# Patient Record
Sex: Male | Born: 1965
Health system: Southern US, Community
[De-identification: ages and names within clinical notes are randomized; demographics above are authoritative.]

## PROBLEM LIST (undated history)

## (undated) DIAGNOSIS — E119 Type 2 diabetes mellitus without complications: Secondary | ICD-10-CM

## (undated) DIAGNOSIS — G4730 Sleep apnea, unspecified: Secondary | ICD-10-CM

## (undated) DIAGNOSIS — I10 Essential (primary) hypertension: Secondary | ICD-10-CM

## (undated) DIAGNOSIS — K8020 Calculus of gallbladder without cholecystitis without obstruction: Secondary | ICD-10-CM

## (undated) DIAGNOSIS — E785 Hyperlipidemia, unspecified: Secondary | ICD-10-CM

## (undated) DIAGNOSIS — D649 Anemia, unspecified: Secondary | ICD-10-CM

## (undated) DIAGNOSIS — R519 Headache, unspecified: Secondary | ICD-10-CM

## (undated) DIAGNOSIS — F329 Major depressive disorder, single episode, unspecified: Secondary | ICD-10-CM

## (undated) DIAGNOSIS — L0231 Cutaneous abscess of buttock: Secondary | ICD-10-CM

## (undated) DIAGNOSIS — I82409 Acute embolism and thrombosis of unspecified deep veins of unspecified lower extremity: Secondary | ICD-10-CM

## (undated) DIAGNOSIS — F32A Depression, unspecified: Secondary | ICD-10-CM

## (undated) DIAGNOSIS — K7460 Unspecified cirrhosis of liver: Secondary | ICD-10-CM

## (undated) DIAGNOSIS — F419 Anxiety disorder, unspecified: Secondary | ICD-10-CM

## (undated) DIAGNOSIS — R161 Splenomegaly, not elsewhere classified: Secondary | ICD-10-CM

## (undated) HISTORY — DX: Splenomegaly, not elsewhere classified: R16.1

## (undated) HISTORY — PX: NASAL SINUS SURGERY: SHX719

## (undated) HISTORY — DX: Anemia, unspecified: D64.9

## (undated) HISTORY — DX: Essential (primary) hypertension: I10

## (undated) HISTORY — DX: Calculus of gallbladder without cholecystitis without obstruction: K80.20

## (undated) HISTORY — DX: Acute embolism and thrombosis of unspecified deep veins of unspecified lower extremity: I82.409

## (undated) HISTORY — DX: Cutaneous abscess of buttock: L02.31

## (undated) HISTORY — DX: Anxiety disorder, unspecified: F41.9

## (undated) HISTORY — DX: Hyperlipidemia, unspecified: E78.5

## (undated) HISTORY — PX: LUMBAR DISC SURGERY: SHX700

## (undated) HISTORY — DX: Depression, unspecified: F32.A

## (undated) HISTORY — PX: KNEE ARTHROSCOPY: SUR90

## (undated) HISTORY — DX: Unspecified cirrhosis of liver: K74.60

## (undated) HISTORY — PX: LAPAROSCOPIC CHOLECYSTECTOMY: SUR755

---
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
## 2019-05-11 ENCOUNTER — Emergency Department (HOSPITAL_COMMUNITY)
Admission: EM | Admit: 2019-05-11 | Discharge: 2019-05-12 | Disposition: A | Discharge status: Home / Self Care | Payer: Self-pay | Attending: Emergency Medicine | Admitting: Emergency Medicine

## 2019-05-11 ENCOUNTER — Other Ambulatory Visit: Payer: Self-pay

## 2019-05-11 DIAGNOSIS — Y929 Unspecified place or not applicable: Secondary | ICD-10-CM | POA: Insufficient documentation

## 2019-05-11 DIAGNOSIS — Y999 Unspecified external cause status: Secondary | ICD-10-CM | POA: Insufficient documentation

## 2019-05-11 DIAGNOSIS — W19XXXA Unspecified fall, initial encounter: Secondary | ICD-10-CM | POA: Insufficient documentation

## 2019-05-11 DIAGNOSIS — Y939 Activity, unspecified: Secondary | ICD-10-CM | POA: Insufficient documentation

## 2019-05-11 DIAGNOSIS — M25562 Pain in left knee: Secondary | ICD-10-CM | POA: Insufficient documentation

## 2019-05-11 DIAGNOSIS — M25552 Pain in left hip: Secondary | ICD-10-CM | POA: Insufficient documentation

## 2019-05-11 DIAGNOSIS — E119 Type 2 diabetes mellitus without complications: Secondary | ICD-10-CM | POA: Insufficient documentation

## 2019-05-11 HISTORY — DX: Type 2 diabetes mellitus without complications: E11.9

## 2019-05-11 NOTE — ED Triage Notes (Signed)
Per EMS - Pt coming from home. Pt had injury 3 days ago, while getting out of bath tub he fell and twisted his hip causing injury that has not been evaluated. Pain in left hip x 3 days now. Today pt has been taking tezapam 40m,  multiple times throughout the day, RX is not prescribed to him, but to his wife. Last pill taken approx 2100. Approx 2300 pt got up out of bed, felt dizzy and fell down flat prone on face. Pt still complaining of left hip pain and now dizziness secondary to taking meds.   EKG NSR  150/90 CBG 355 80 HR 100% RA  16 R  100 mcg fentanyl   20 Lt AC

## 2019-05-12 ENCOUNTER — Encounter (HOSPITAL_COMMUNITY): Payer: Self-pay | Admitting: Emergency Medicine

## 2019-05-12 ENCOUNTER — Emergency Department (HOSPITAL_COMMUNITY): Payer: Self-pay

## 2019-05-12 ENCOUNTER — Other Ambulatory Visit: Payer: Self-pay

## 2019-05-12 ENCOUNTER — Inpatient Hospital Stay (HOSPITAL_COMMUNITY)
Admission: EM | Admit: 2019-05-12 | Discharge: 2019-05-17 | DRG: 872 | Disposition: A | Discharge status: Home Health | Payer: Self-pay | Attending: Family Medicine | Admitting: Family Medicine

## 2019-05-12 DIAGNOSIS — A4151 Sepsis due to Escherichia coli [E. coli]: Principal | ICD-10-CM | POA: Diagnosis present

## 2019-05-12 DIAGNOSIS — S76312A Strain of muscle, fascia and tendon of the posterior muscle group at thigh level, left thigh, initial encounter: Secondary | ICD-10-CM | POA: Diagnosis present

## 2019-05-12 DIAGNOSIS — M25552 Pain in left hip: Secondary | ICD-10-CM

## 2019-05-12 DIAGNOSIS — R739 Hyperglycemia, unspecified: Secondary | ICD-10-CM

## 2019-05-12 DIAGNOSIS — Z6841 Body Mass Index (BMI) 40.0 and over, adult: Secondary | ICD-10-CM

## 2019-05-12 DIAGNOSIS — W182XXA Fall in (into) shower or empty bathtub, initial encounter: Secondary | ICD-10-CM | POA: Diagnosis present

## 2019-05-12 DIAGNOSIS — E119 Type 2 diabetes mellitus without complications: Secondary | ICD-10-CM

## 2019-05-12 DIAGNOSIS — Y93E1 Activity, personal bathing and showering: Secondary | ICD-10-CM

## 2019-05-12 DIAGNOSIS — A419 Sepsis, unspecified organism: Secondary | ICD-10-CM | POA: Diagnosis present

## 2019-05-12 DIAGNOSIS — N3000 Acute cystitis without hematuria: Secondary | ICD-10-CM | POA: Diagnosis present

## 2019-05-12 DIAGNOSIS — N390 Urinary tract infection, site not specified: Secondary | ICD-10-CM

## 2019-05-12 DIAGNOSIS — Z20828 Contact with and (suspected) exposure to other viral communicable diseases: Secondary | ICD-10-CM | POA: Diagnosis present

## 2019-05-12 DIAGNOSIS — Z9114 Patient's other noncompliance with medication regimen: Secondary | ICD-10-CM

## 2019-05-12 DIAGNOSIS — E1165 Type 2 diabetes mellitus with hyperglycemia: Secondary | ICD-10-CM | POA: Diagnosis present

## 2019-05-12 HISTORY — DX: Headache, unspecified: R51.9

## 2019-05-12 HISTORY — DX: Sleep apnea, unspecified: G47.30

## 2019-05-12 LAB — BASIC METABOLIC PANEL
Anion gap: 11 (ref 5–15)
BUN: 14 mg/dL (ref 6–20)
CO2: 20 mmol/L — ABNORMAL LOW (ref 22–32)
Calcium: 8.8 mg/dL — ABNORMAL LOW (ref 8.9–10.3)
Chloride: 97 mmol/L — ABNORMAL LOW (ref 98–111)
Creatinine, Ser: 0.72 mg/dL (ref 0.61–1.24)
GFR calc Af Amer: >60 mL/min (ref >60)
GFR calc non Af Amer: >60 mL/min (ref >60)
Glucose, Bld: 386 mg/dL — ABNORMAL HIGH (ref 70–99)
Potassium: 4.6 mmol/L (ref 3.5–5.1)
Sodium: 128 mmol/L — ABNORMAL LOW (ref 135–145)

## 2019-05-12 LAB — CBC
HCT: 46.9 % (ref 39.0–52.0)
Hemoglobin: 16.4 g/dL (ref 13.0–17.0)
MCH: 30.5 pg (ref 26.0–34.0)
MCHC: 35 g/dL (ref 30.0–36.0)
MCV: 87.2 fL (ref 80.0–100.0)
Platelets: 193 10*3/uL (ref 150–400)
RBC: 5.38 MIL/uL (ref 4.22–5.81)
RDW: 13.7 % (ref 11.5–15.5)
WBC: 17.3 10*3/uL — ABNORMAL HIGH (ref 4.0–10.5)
nRBC: 0 % (ref 0.0–0.2)

## 2019-05-12 LAB — HEPATIC FUNCTION PANEL
ALT: 47 U/L — ABNORMAL HIGH (ref 0–44)
AST: 52 U/L — ABNORMAL HIGH (ref 15–41)
Albumin: 2.5 g/dL — ABNORMAL LOW (ref 3.5–5.0)
Alkaline Phosphatase: 164 U/L — ABNORMAL HIGH (ref 38–126)
Bilirubin, Direct: 0.7 mg/dL — ABNORMAL HIGH (ref 0.0–0.2)
Indirect Bilirubin: 1.4 mg/dL — ABNORMAL HIGH (ref 0.3–0.9)
Total Bilirubin: 2.1 mg/dL — ABNORMAL HIGH (ref 0.3–1.2)
Total Protein: 7.2 g/dL (ref 6.5–8.1)

## 2019-05-12 LAB — URINALYSIS, ROUTINE W REFLEX MICROSCOPIC
Bilirubin Urine: NEGATIVE
Glucose, UA: >=500 mg/dL — AB
Ketones, ur: 20 mg/dL — AB
Nitrite: NEGATIVE
Protein, ur: 100 mg/dL — AB
RBC / HPF: >50 RBC/hpf — ABNORMAL HIGH (ref 0–5)
Specific Gravity, Urine: 1.026 (ref 1.005–1.030)
WBC, UA: >50 WBC/hpf — ABNORMAL HIGH (ref 0–5)
pH: 5 (ref 5.0–8.0)

## 2019-05-12 LAB — CBG MONITORING, ED: Glucose-Capillary: 365 mg/dL — ABNORMAL HIGH (ref 70–99)

## 2019-05-12 IMAGING — CR DG KNEE COMPLETE 4+V*L*
4 series · 4 of 4 positions shown · non-contrast
Comparison: None.

CLINICAL DATA: Fall

EXAM:
LEFT KNEE - COMPLETE 4+ VIEW

[t knee ap left]
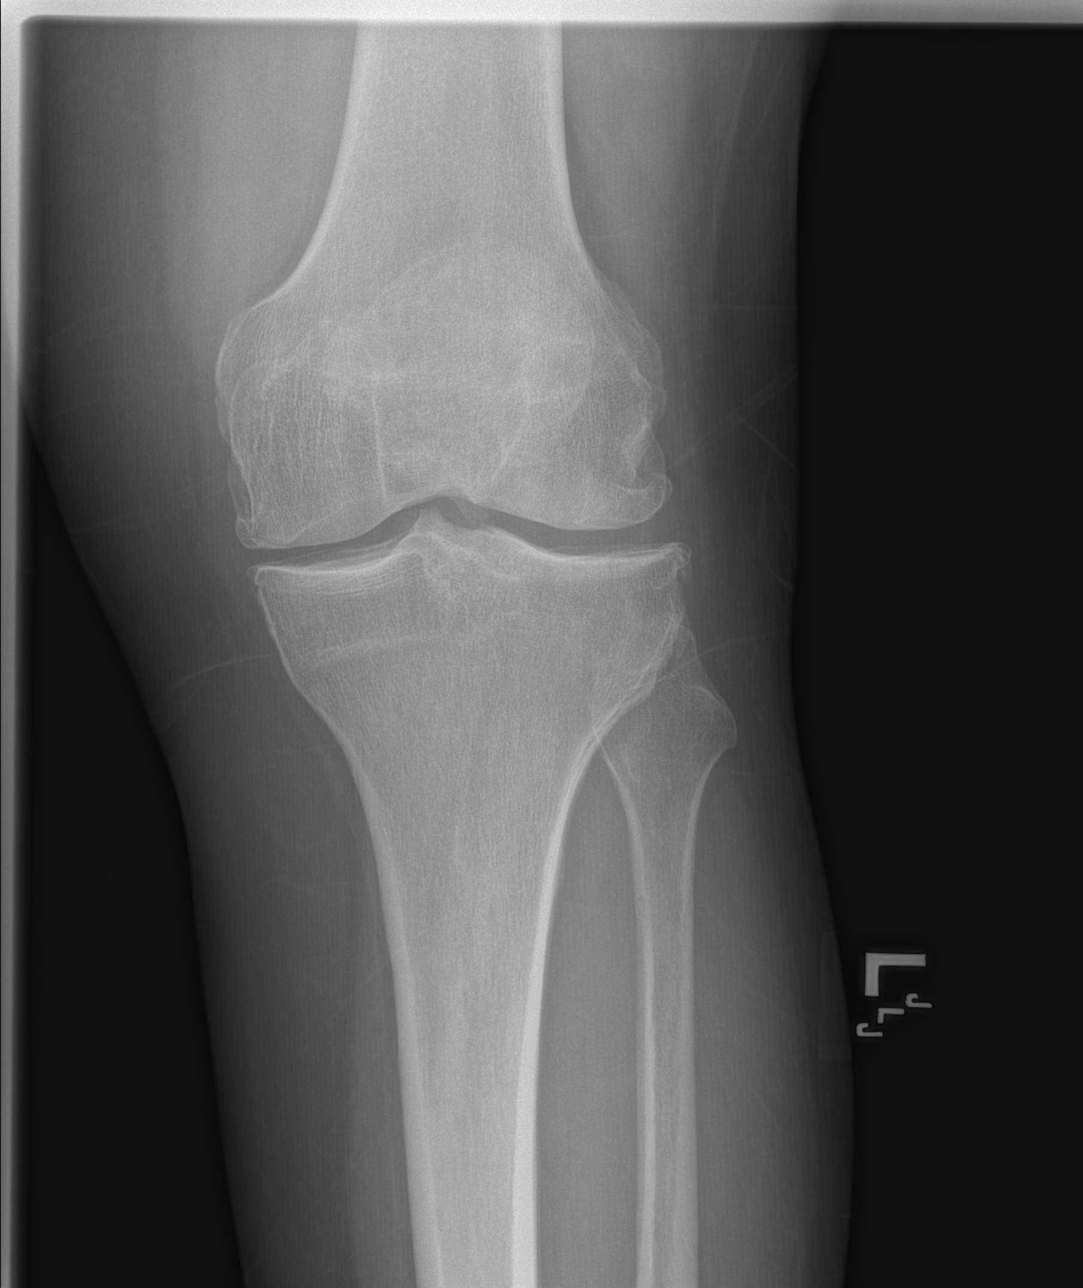

[t knee obl left (1 of 2)]
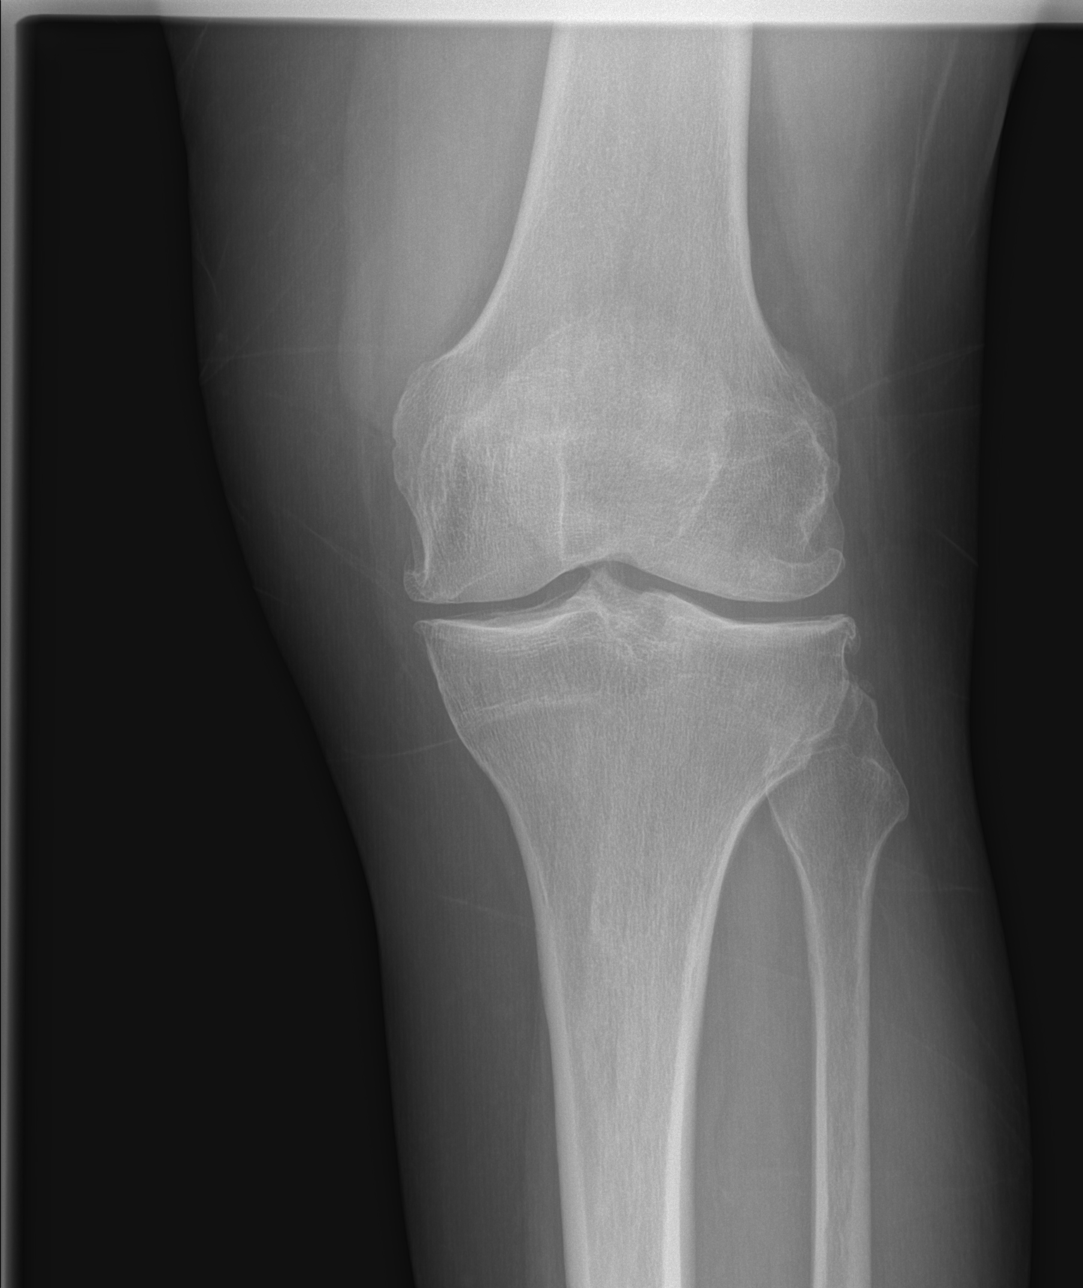

[t knee obl left (2 of 2)]
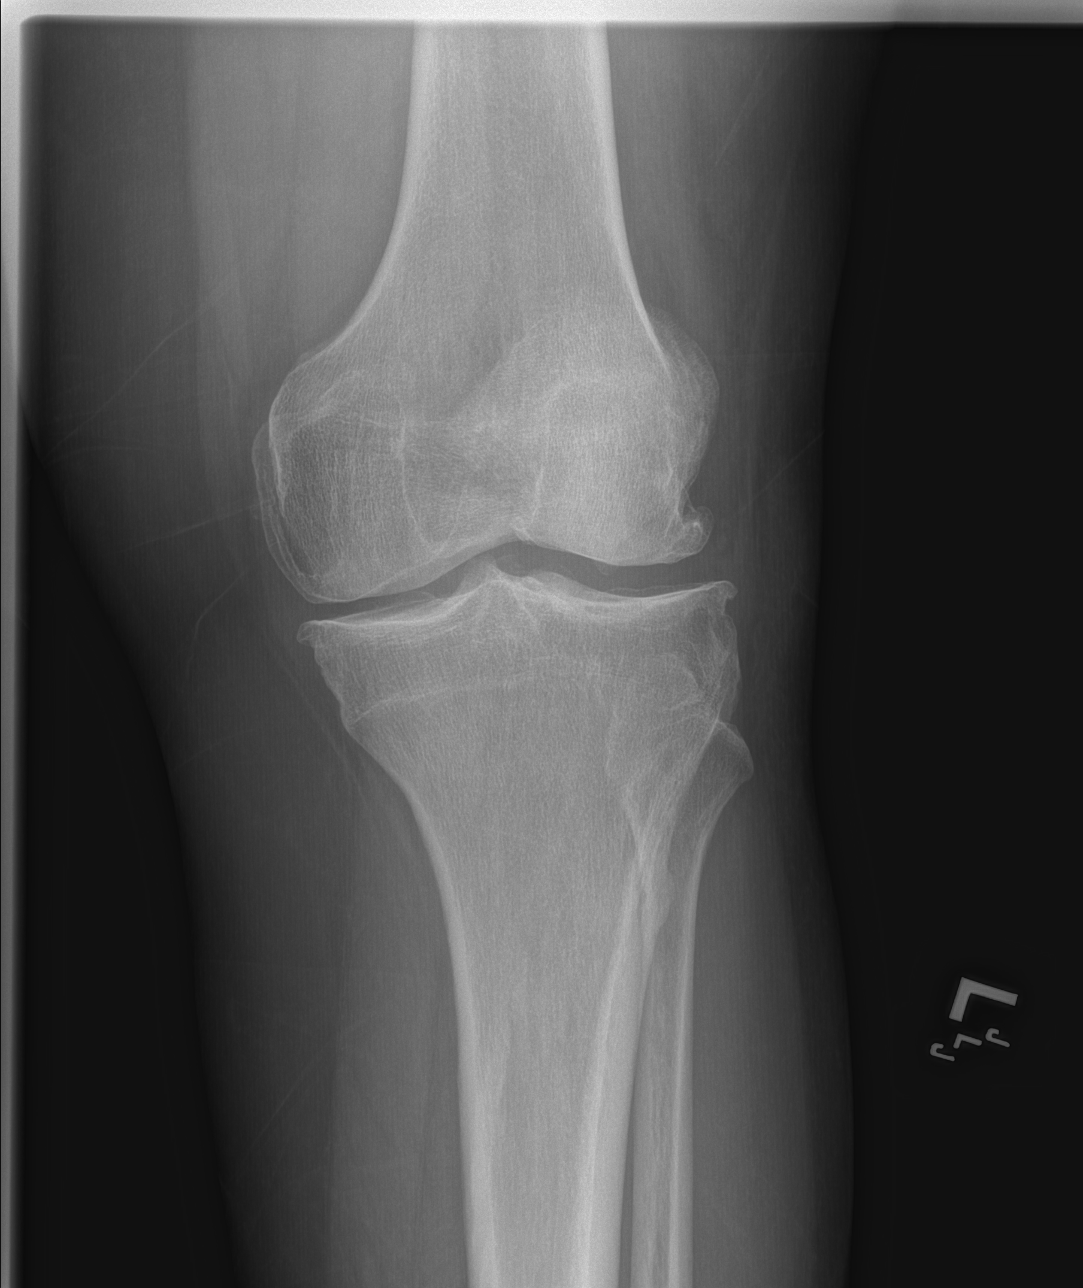

[x knee lat left]
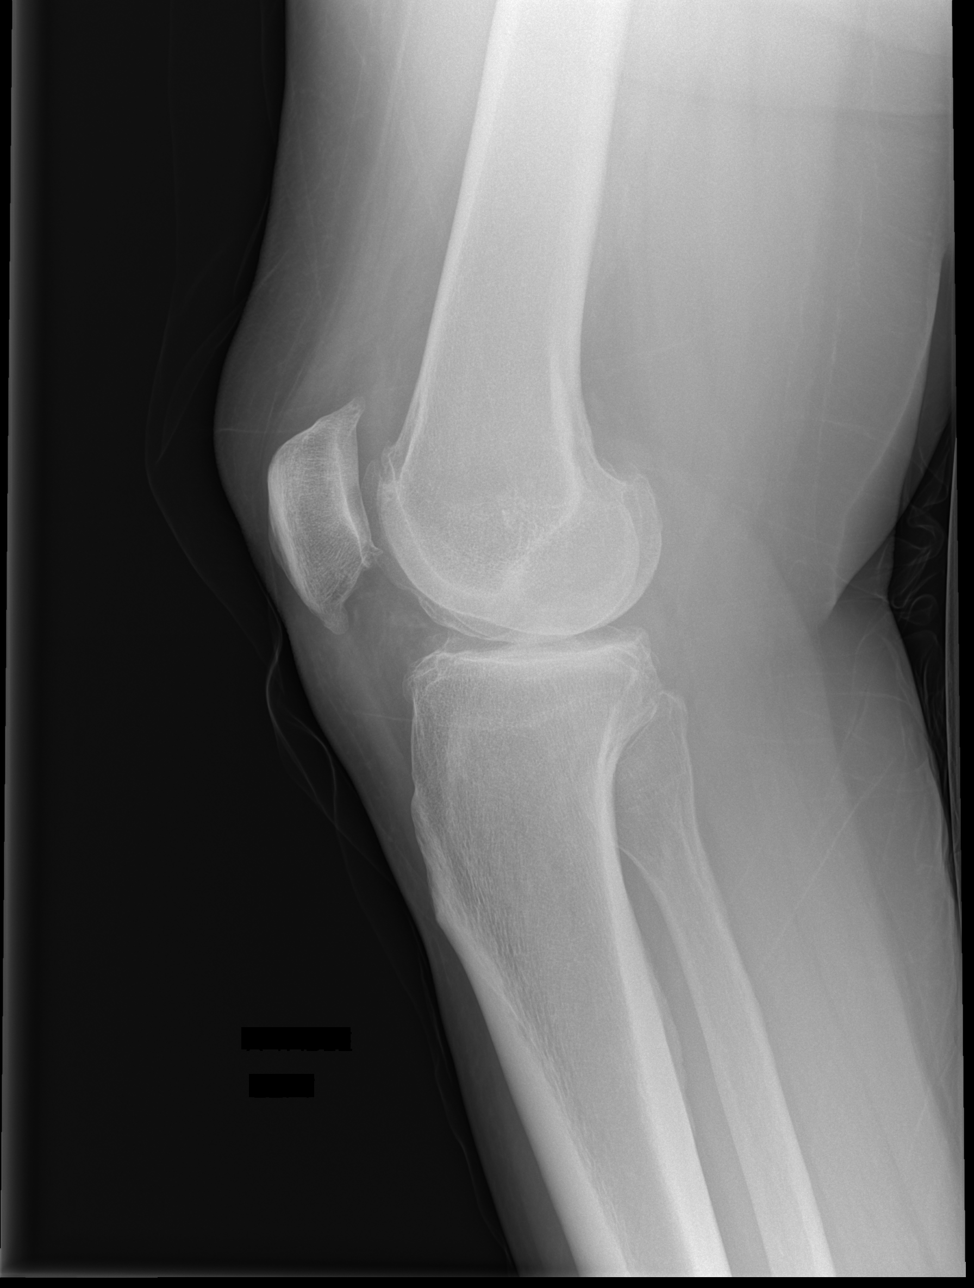

[4 of 4 positions shown; findings below may reference images not displayed]

FINDINGS: Tricompartment degenerative changes within the left knee with joint
space narrowing and spurring. Small joint effusion. No acute bony
abnormality. Specifically, no fracture, subluxation, or dislocation.
IMPRESSION: Tricompartment degenerative changes. Small joint effusion. No acute
bony abnormality.

## 2019-05-12 IMAGING — CT CT HIP*L* W/O CM
2 of 4 series · 17 of 46 positions shown, 20 images · non-contrast
Comparison: Plain films today

CLINICAL DATA: Fall, left hip pain

EXAM:
CT OF THE LEFT HIP WITHOUT CONTRAST
TECHNIQUE: Multidetector CT imaging of the left hip was performed according to
the standard protocol. Multiplanar CT image reconstructions were
also generated.

[Series 5: soft tissue · axial · 0.50mm/px · z∈[-639,-429]mm · 14 of 117 slices shown, 17 images]
[im 8/117  soft-tissue]
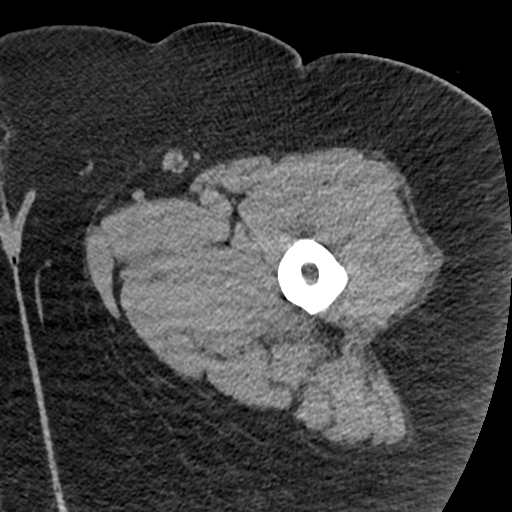
[im 8/117  bone]
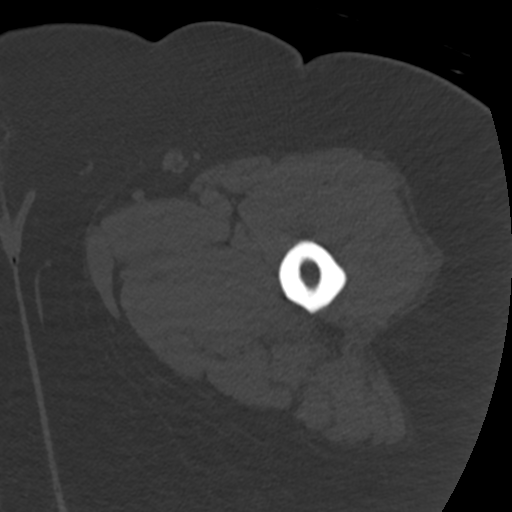
[im 19/117  soft-tissue]
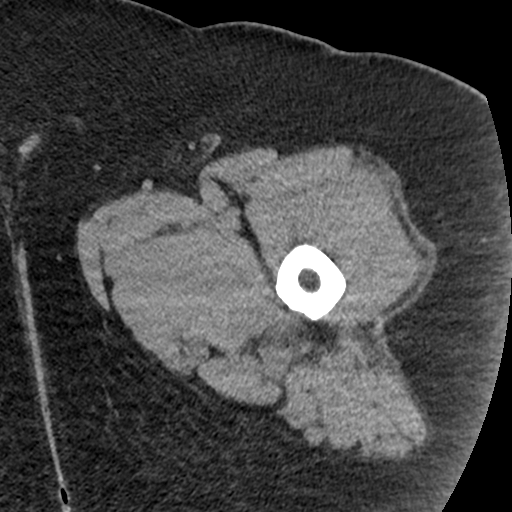
[im 27/117  soft-tissue]
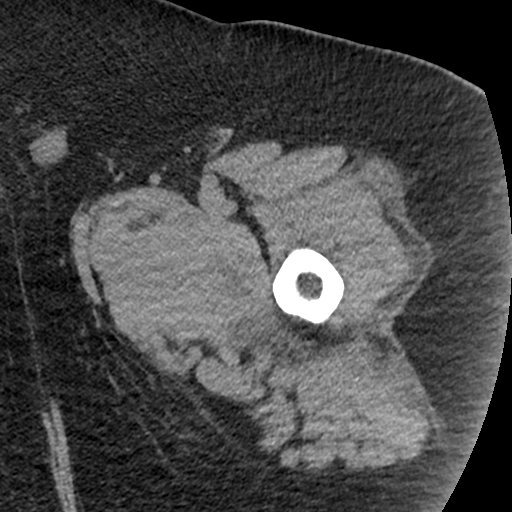
[im 38/117  soft-tissue]
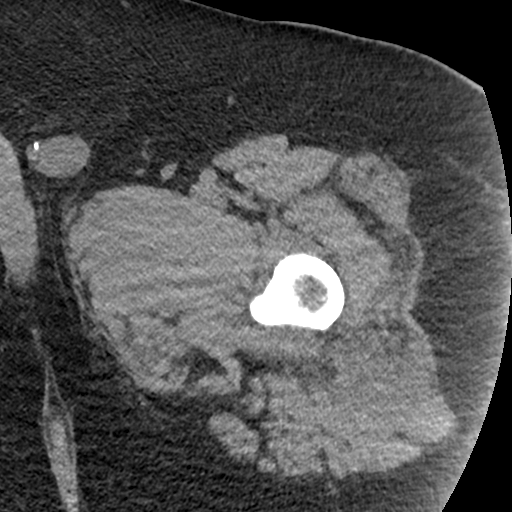
[im 49/117  soft-tissue]
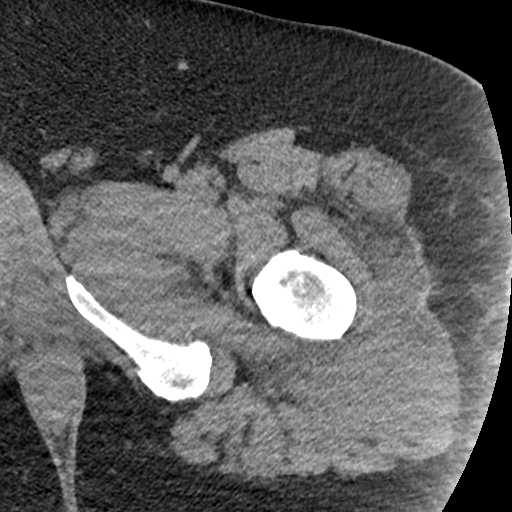
[im 60/117  soft-tissue]
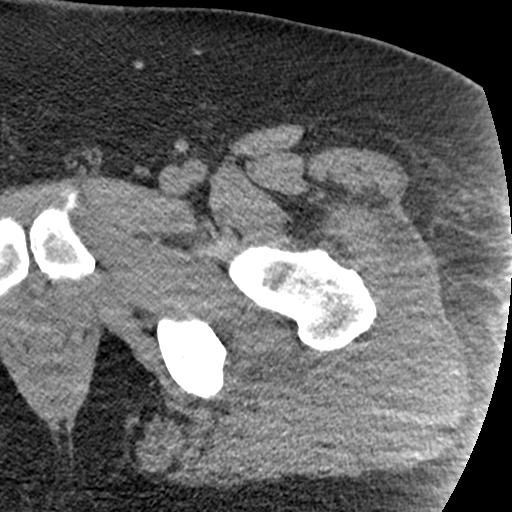
[im 68/117  soft-tissue]
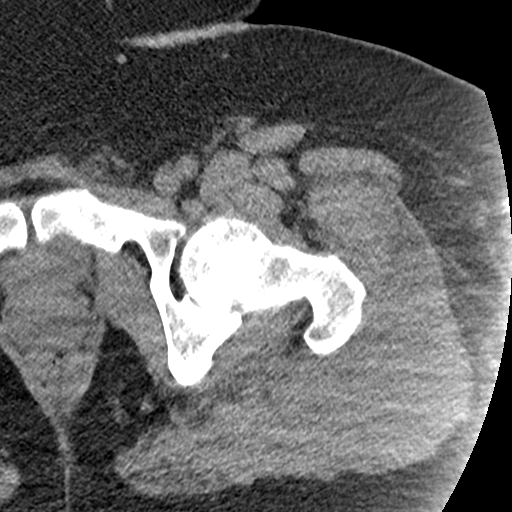
[im 79/117  soft-tissue]
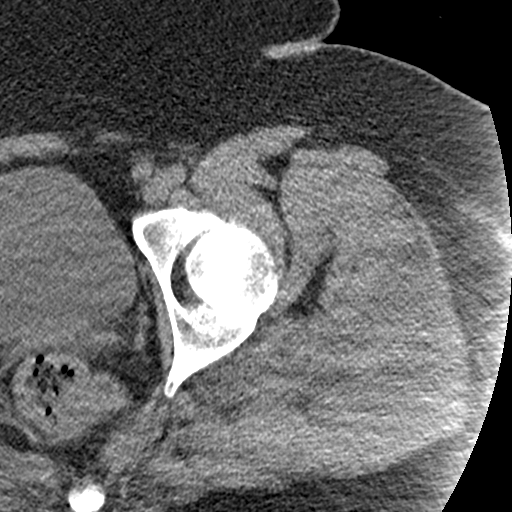
[im 90/117  soft-tissue]
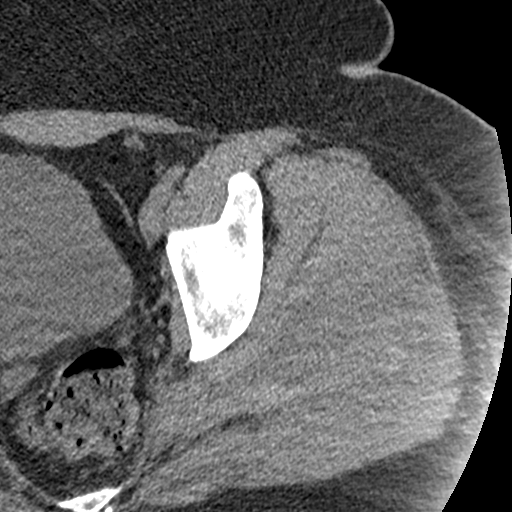
[im 90/117  bone]
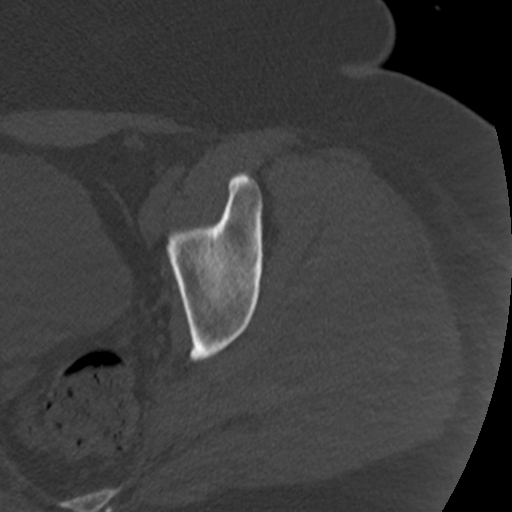
[im 98/117  soft-tissue]
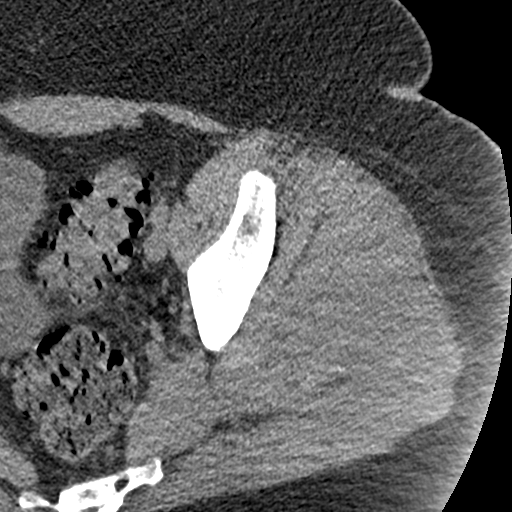
[im 102/117  lung]
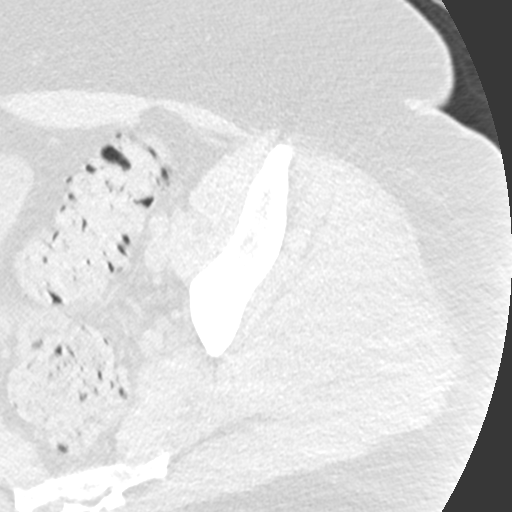
[im 105/117  lung]
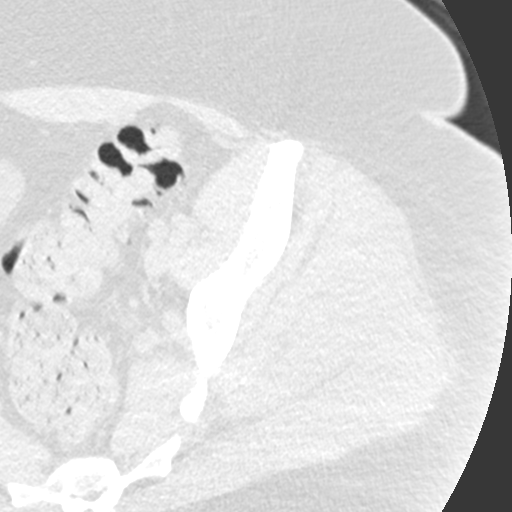
[im 109/117  soft-tissue]
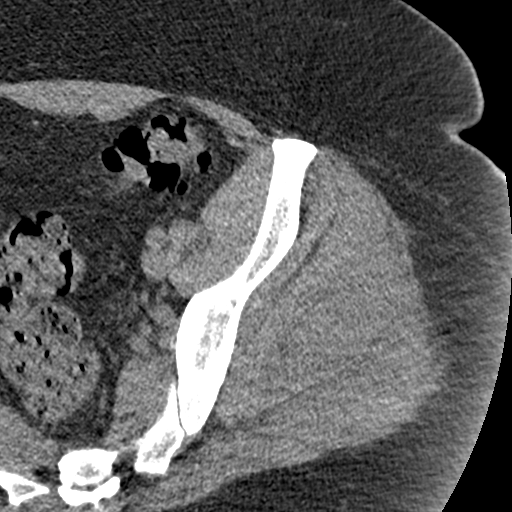
[im 109/117  lung]
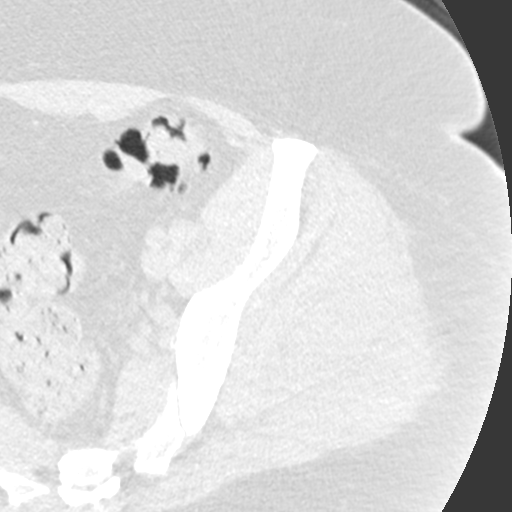
[im 113/117  lung]
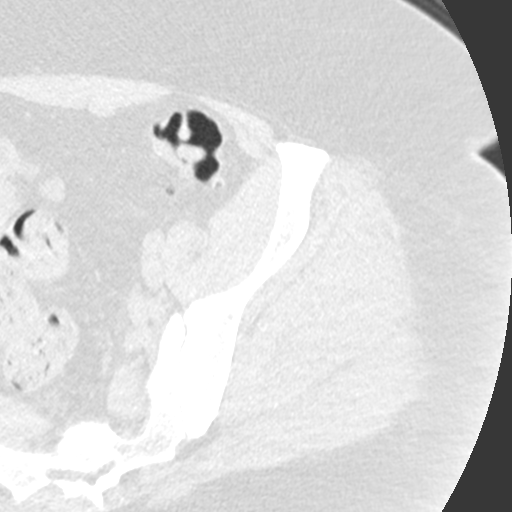

[Series 6: cor soft · coronal · 0.49mm/px · 3 of 165 slices shown]
[im 55/165  soft-tissue]
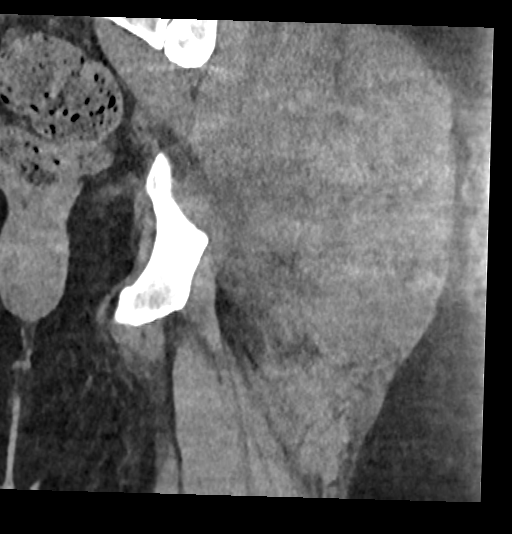
[im 73/165  soft-tissue]
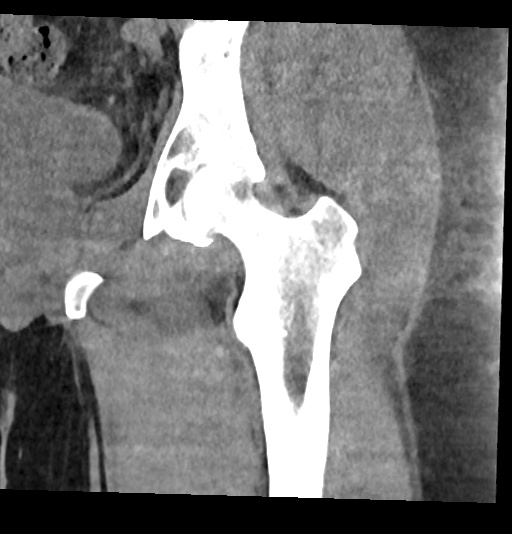
[im 92/165  soft-tissue]
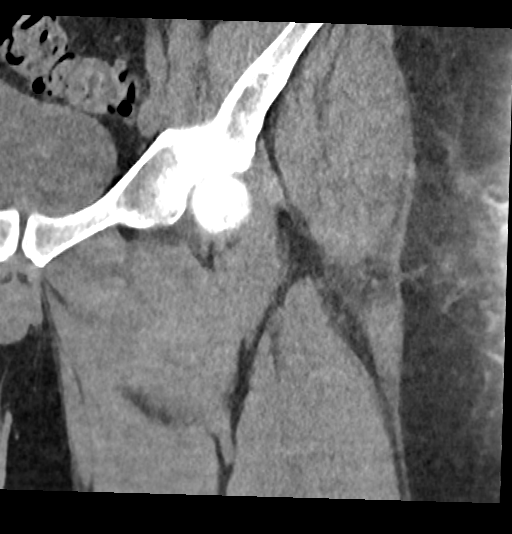

[17 of 46 positions shown; findings below may reference images not displayed]

FINDINGS: No fracture.  No subluxation or dislocation.

Scattered diverticula in the visualized sigmoid colon with moderate
stool burden. No visible acute findings.
IMPRESSION: No acute bony abnormality.

## 2019-05-12 IMAGING — CT CT CERVICAL SPINE W/O CM
3 of 4 series · 11 of 33 positions shown, 13 images · non-contrast
Comparison: None.

CLINICAL DATA: Fall

EXAM:
CT HEAD WITHOUT CONTRAST
CT CERVICAL SPINE WITHOUT CONTRAST
TECHNIQUE: Multidetector CT imaging of the head and cervical spine was
performed following the standard protocol without intravenous
contrast. Multiplanar CT image reconstructions of the cervical spine
were also generated.

[Series 6: orthogonal bone · axial · 0.23mm/px · z∈[-305,-161]mm · 3 of 112 slices shown, 4 images]
[im 19/112  soft-tissue]
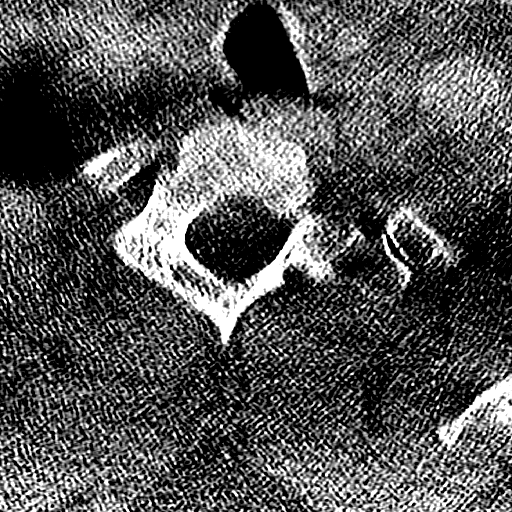
[im 19/112  bone]
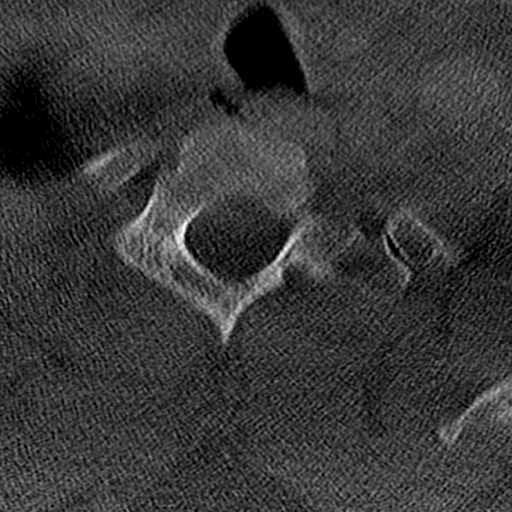
[im 56/112  bone]
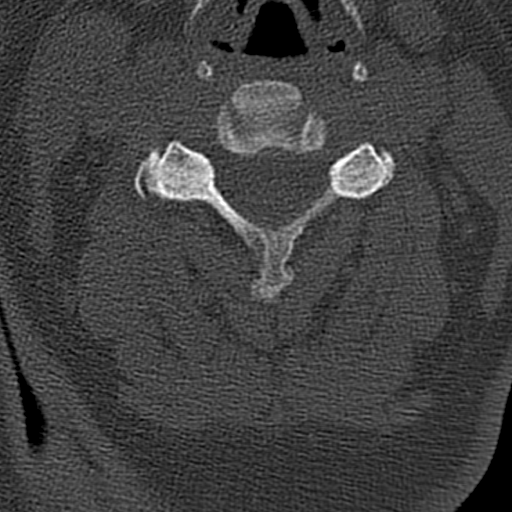
[im 93/112  bone]
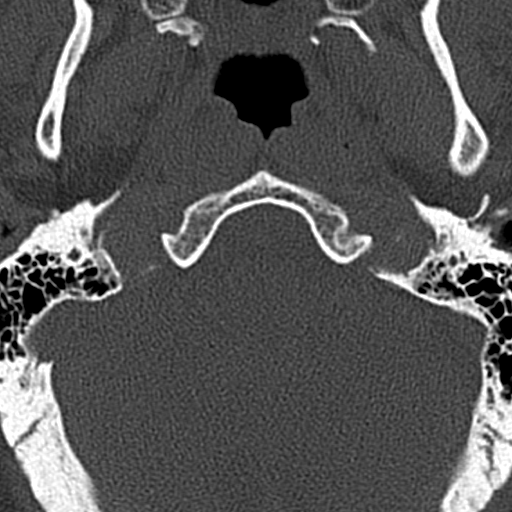

[Series 7: coronal bone · coronal · 0.24mm/px · 3 of 66 slices shown]
[im 14/66  bone]
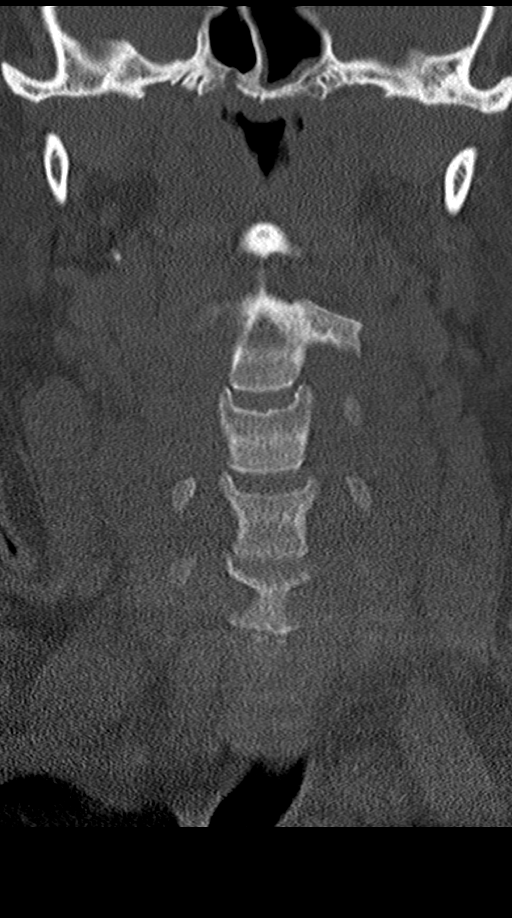
[im 27/66  bone]
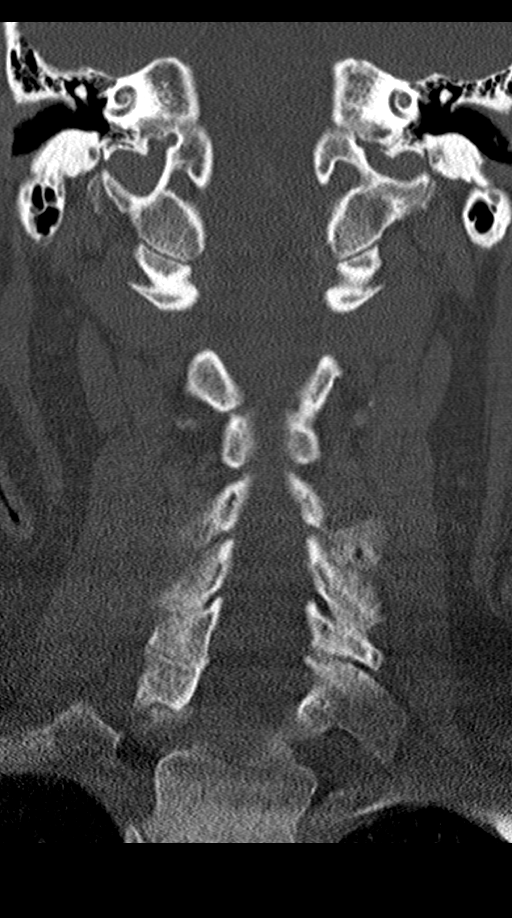
[im 40/66  bone]
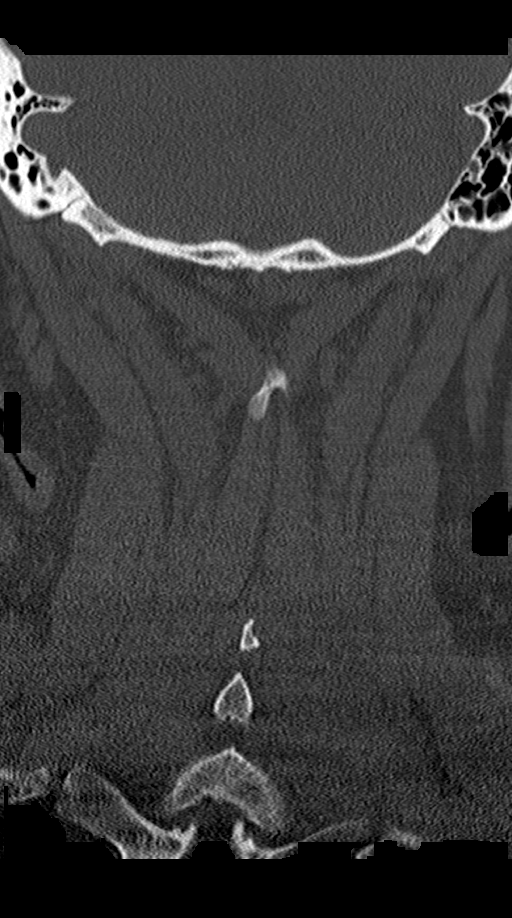

[Series 8: sagittal bone · sagittal · 0.25mm/px · 5 of 55 slices shown, 6 images]
[im 19/55  bone]
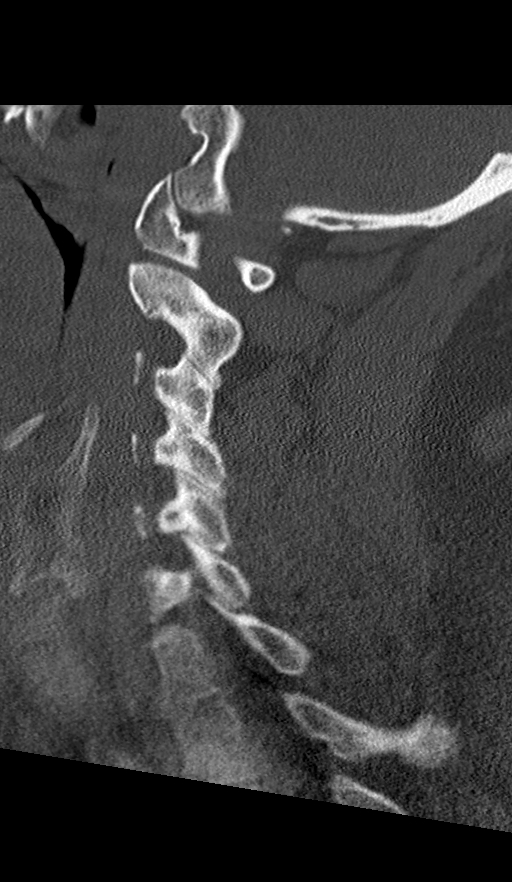
[im 23/55  bone]
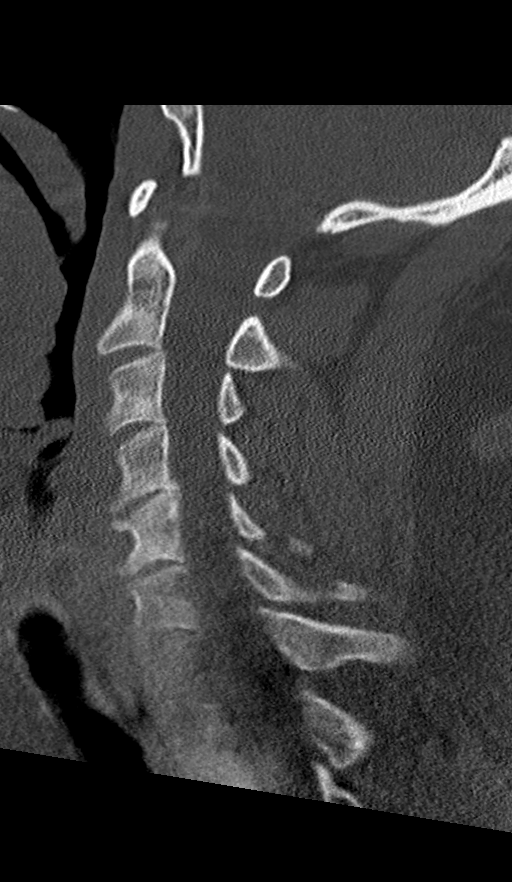
[im 28/55  soft-tissue]
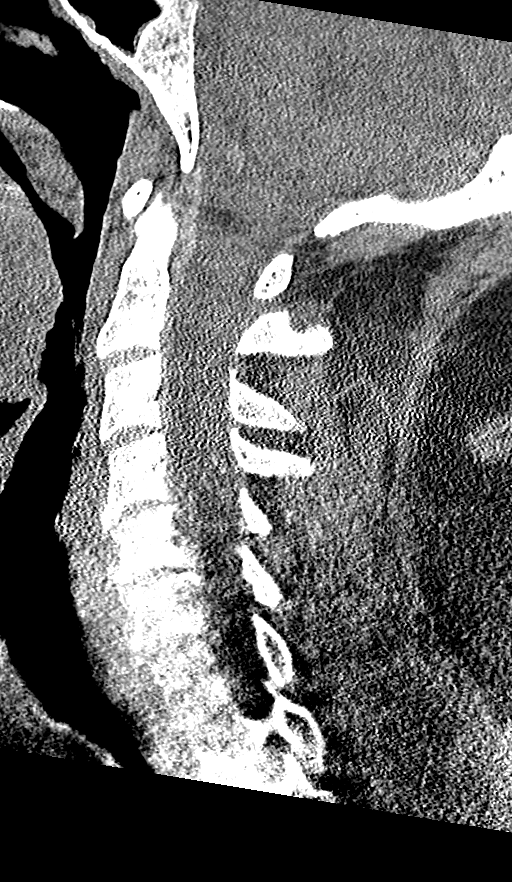
[im 28/55  bone]
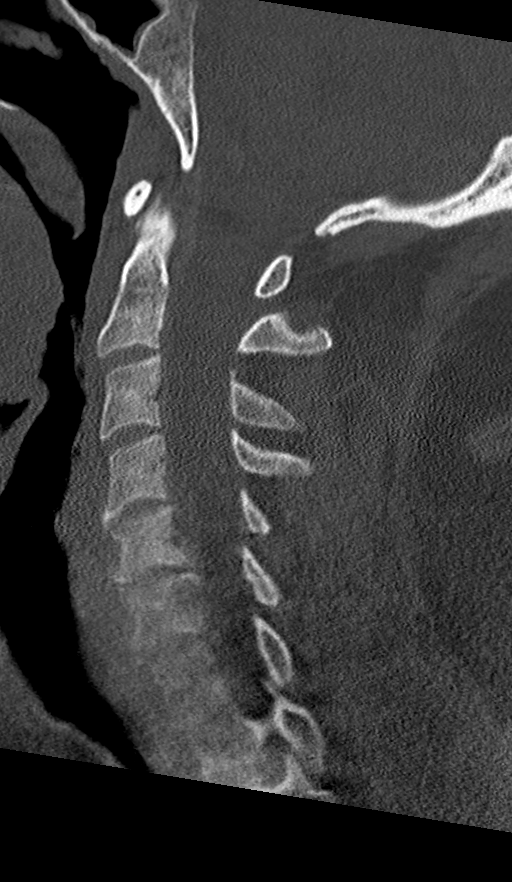
[im 32/55  bone]
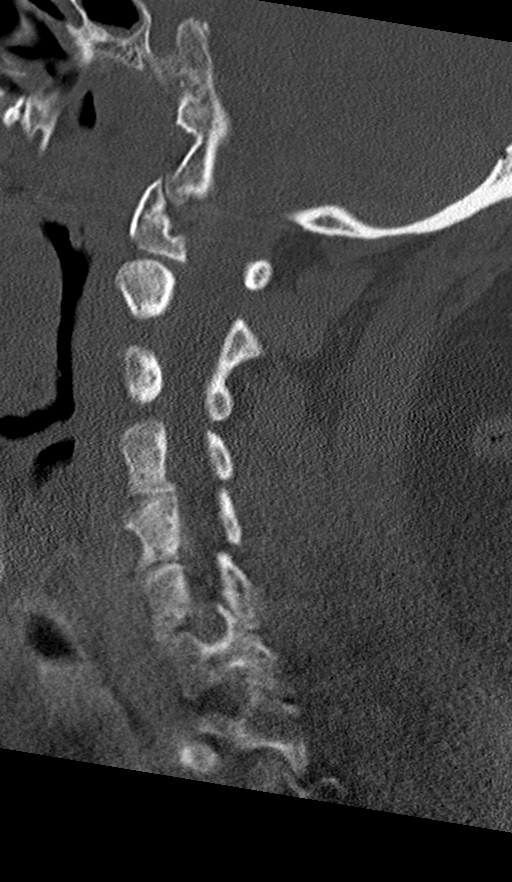
[im 37/55  bone]
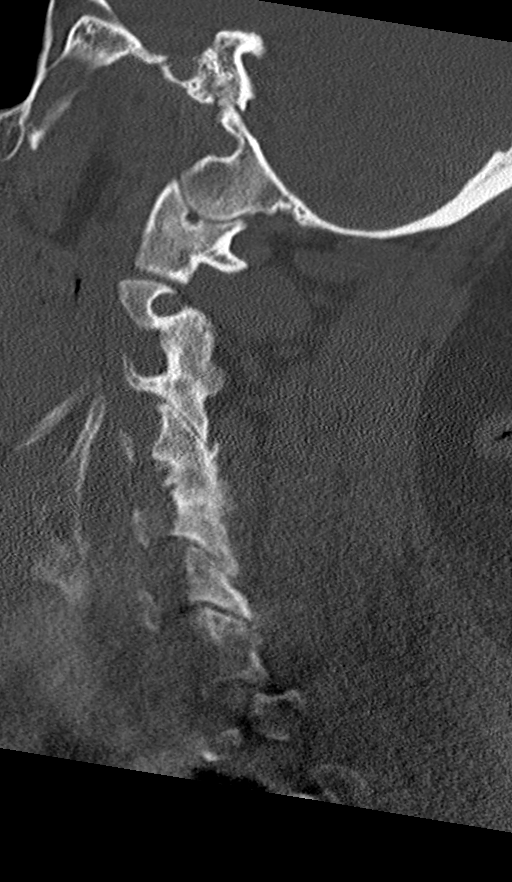

[11 of 33 positions shown; findings below may reference images not displayed]

FINDINGS: CT HEAD FINDINGS

Brain: No acute intracranial abnormality. Specifically, no
hemorrhage, hydrocephalus, mass lesion, acute infarction, or
significant intracranial injury.

Vascular: No hyperdense vessel or unexpected calcification.

Skull: No acute calvarial abnormality.

Sinuses/Orbits: Mucosal thickening in the ethmoid air cells and
right maxillary sinus. No air-fluid levels.

Other: None

CT CERVICAL SPINE FINDINGS

Alignment: Normal

Skull base and vertebrae: No acute fracture. No primary bone lesion
or focal pathologic process.

Soft tissues and spinal canal: No prevertebral fluid or swelling. No
visible canal hematoma.

Disc levels: Maintained. Mild degenerative facet disease
bilaterally.

Upper chest: No acute findings

Other: None
IMPRESSION: No acute intracranial abnormality.

No acute bony abnormality in the cervical spine.

## 2019-05-12 IMAGING — CT CT HEAD W/O CM
3 series · 15 of 47 positions shown, 18 images · non-contrast
Comparison: None.

CLINICAL DATA: Fall

EXAM:
CT HEAD WITHOUT CONTRAST
CT CERVICAL SPINE WITHOUT CONTRAST
TECHNIQUE: Multidetector CT imaging of the head and cervical spine was
performed following the standard protocol without intravenous
contrast. Multiplanar CT image reconstructions of the cervical spine
were also generated.

[Series 3: head wo · axial · 0.44mm/px · z∈[-143,+7]mm · 9 of 36 slices shown, 12 images]
[im 3/36  brain]
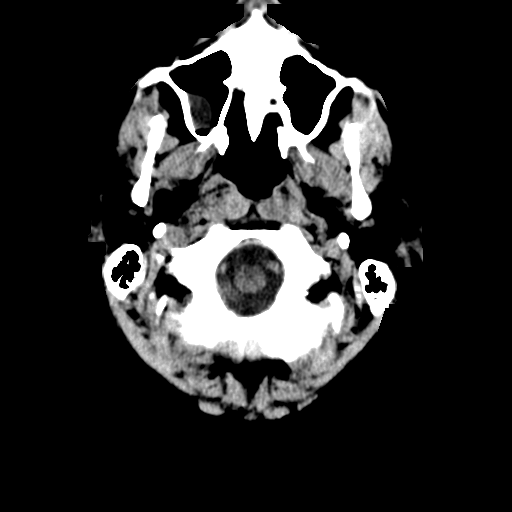
[im 3/36  bone]
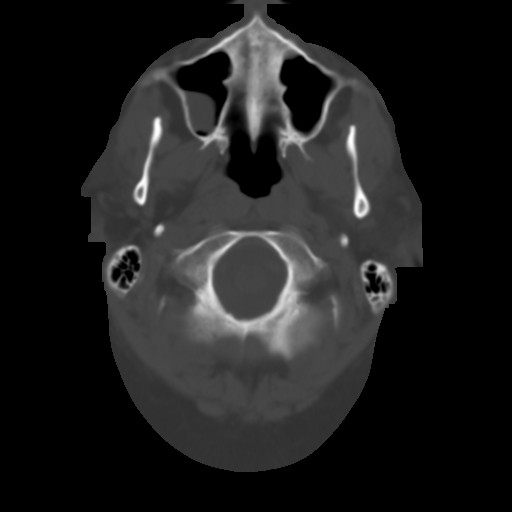
[im 7/36  brain]
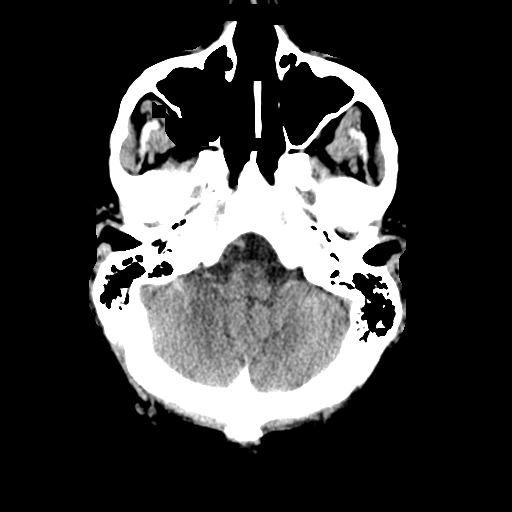
[im 10/36  brain]
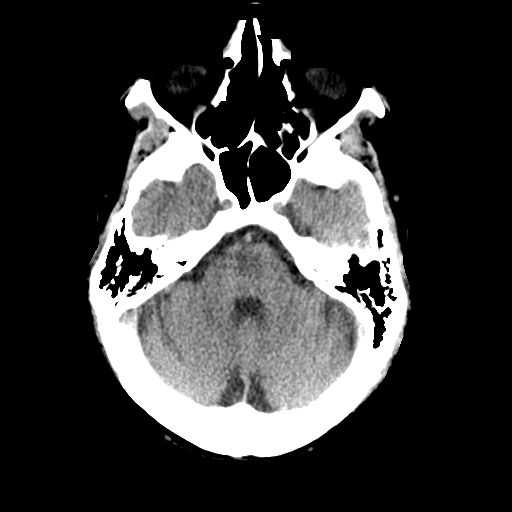
[im 14/36  brain]
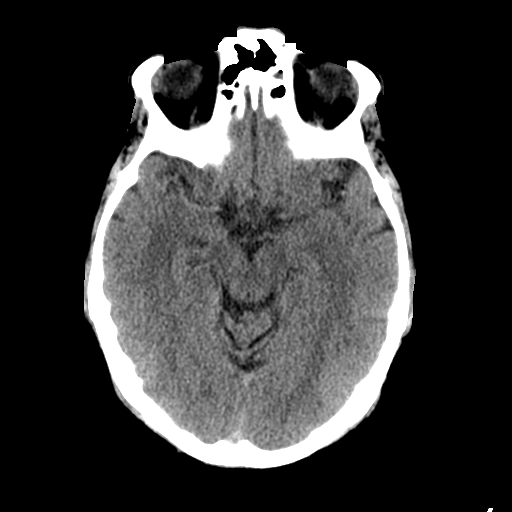
[im 19/36  brain]
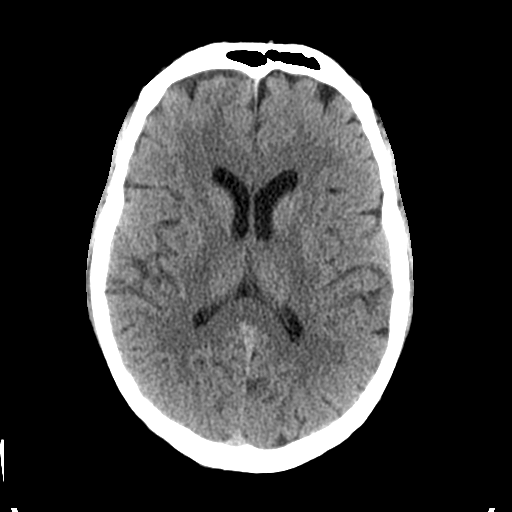
[im 19/36  bone]
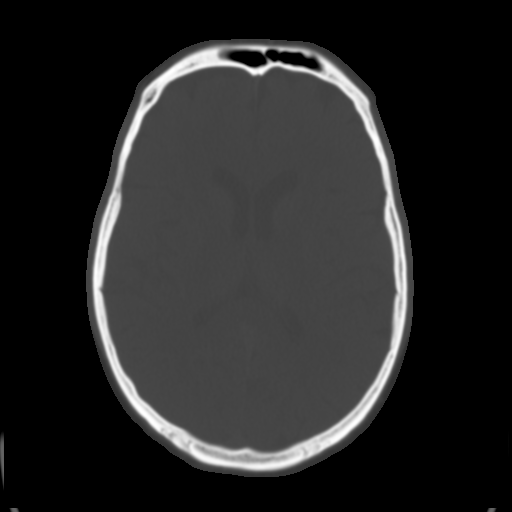
[im 22/36  brain]
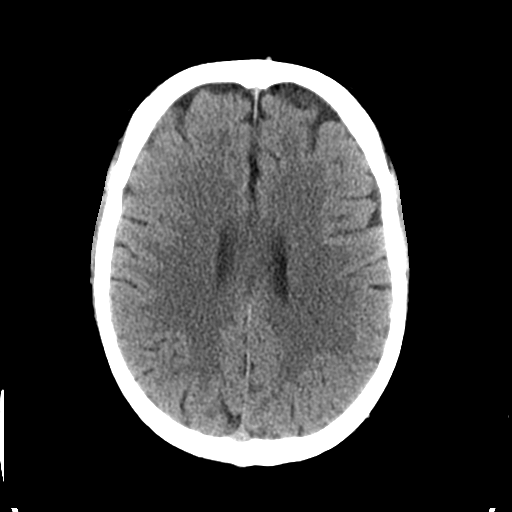
[im 26/36  brain]
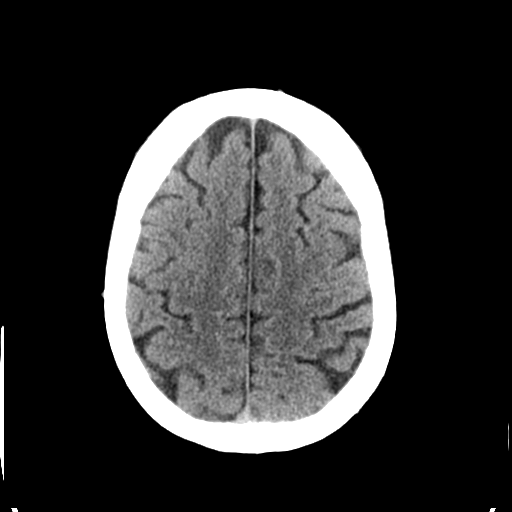
[im 29/36  brain]
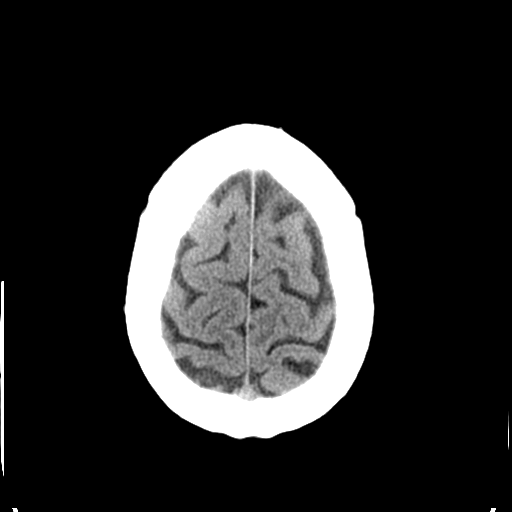
[im 33/36  brain]
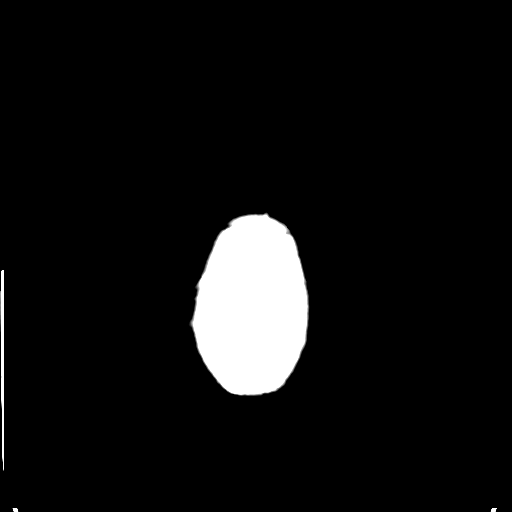
[im 33/36  bone]
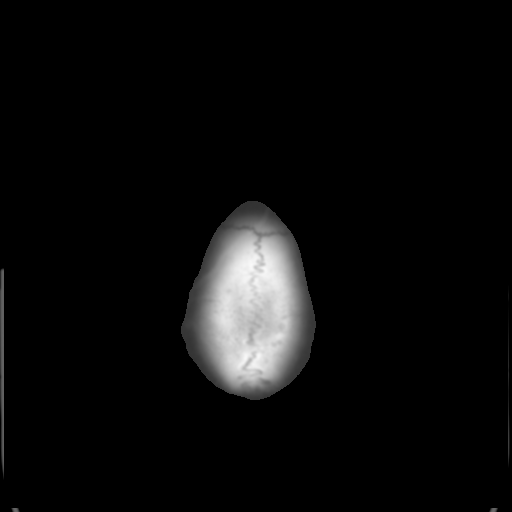

[Series 6: coronal soft tissue · coronal · 0.34mm/px · 3 of 79 slices shown]
[im 27/79  brain]
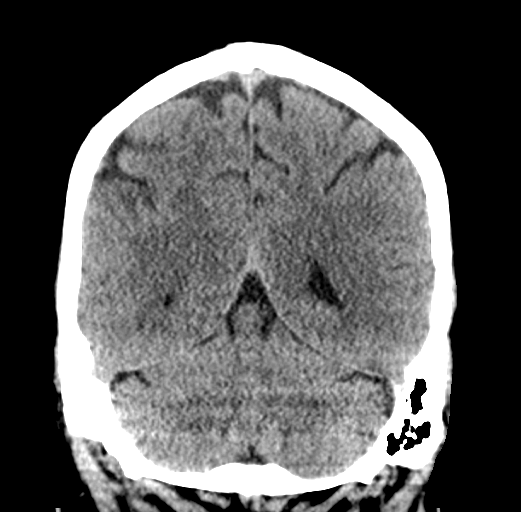
[im 35/79  brain]
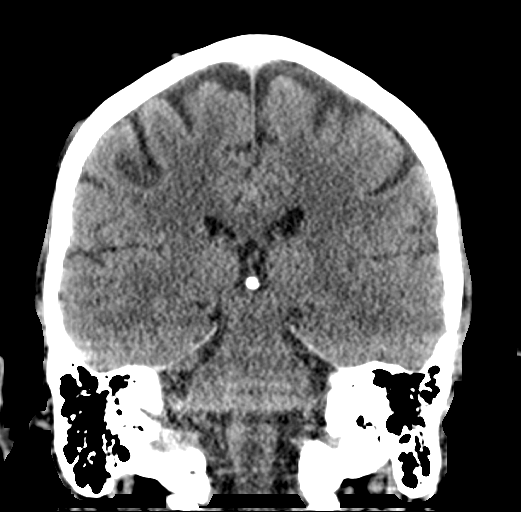
[im 44/79  brain]
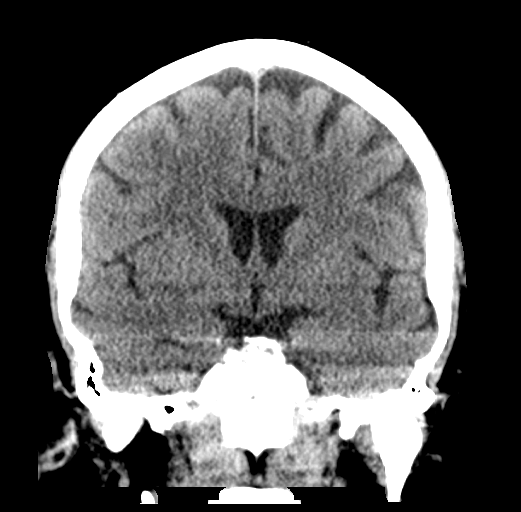

[Series 7: sagittal soft tissue · sagittal · 0.35mm/px · 3 of 55 slices shown]
[im 19/55  brain]
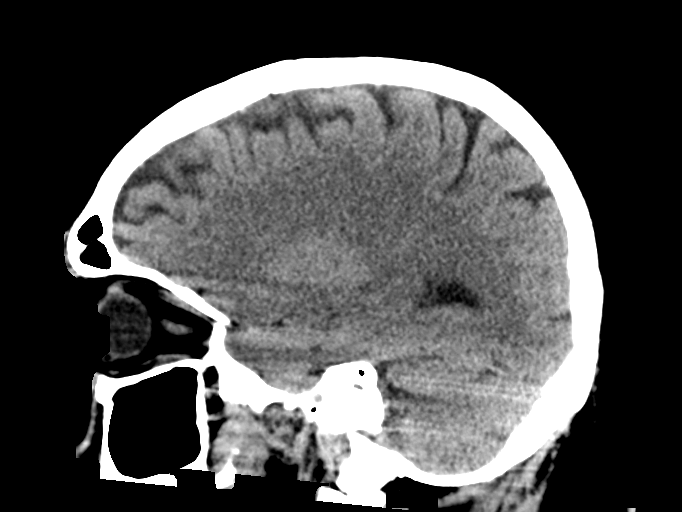
[im 28/55  brain]
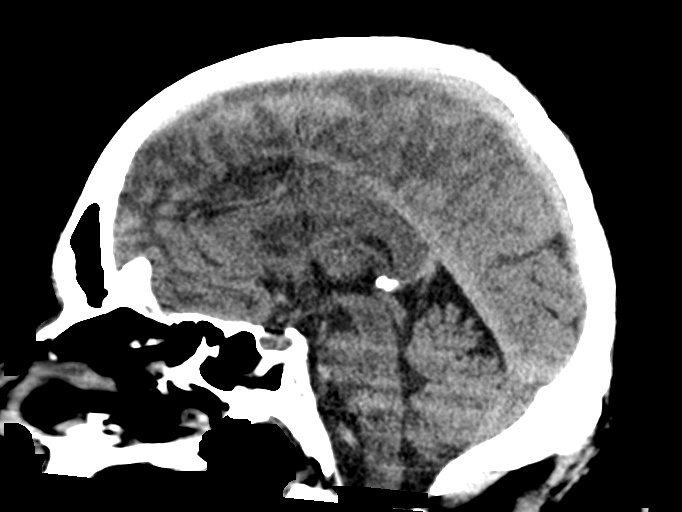
[im 37/55  brain]
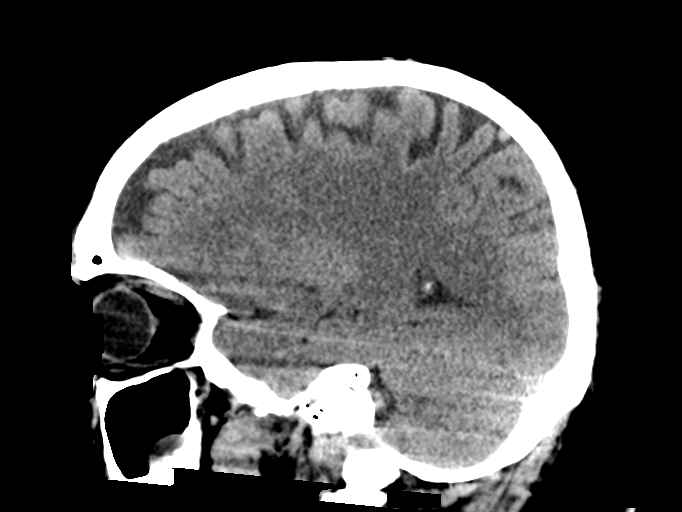

[15 of 47 positions shown; findings below may reference images not displayed]

FINDINGS: CT HEAD FINDINGS

Brain: No acute intracranial abnormality. Specifically, no
hemorrhage, hydrocephalus, mass lesion, acute infarction, or
significant intracranial injury.

Vascular: No hyperdense vessel or unexpected calcification.

Skull: No acute calvarial abnormality.

Sinuses/Orbits: Mucosal thickening in the ethmoid air cells and
right maxillary sinus. No air-fluid levels.

Other: None

CT CERVICAL SPINE FINDINGS

Alignment: Normal

Skull base and vertebrae: No acute fracture. No primary bone lesion
or focal pathologic process.

Soft tissues and spinal canal: No prevertebral fluid or swelling. No
visible canal hematoma.

Disc levels: Maintained. Mild degenerative facet disease
bilaterally.

Upper chest: No acute findings

Other: None
IMPRESSION: No acute intracranial abnormality.

No acute bony abnormality in the cervical spine.

## 2019-05-12 MED ORDER — HYDROMORPHONE HCL 1 MG/ML IJ SOLN
1.0000 mg | Freq: Once | INTRAMUSCULAR | Status: AC
  Administered 2019-05-12: 1 mg via INTRAVENOUS
  Filled 2019-05-12: qty 1

## 2019-05-12 MED ORDER — SODIUM CHLORIDE 0.9 % IV BOLUS
500.0000 mL | Freq: Once | INTRAVENOUS | Status: AC
  Administered 2019-05-12: 500 mL via INTRAVENOUS

## 2019-05-12 MED ORDER — DICLOFENAC SODIUM 1 % TD GEL: 4.0000 g | Freq: Four times a day (QID) | TRANSDERMAL | 0 refills | Status: DC

## 2019-05-12 MED ORDER — SODIUM CHLORIDE 0.9% FLUSH
3.0000 mL | Freq: Once | INTRAVENOUS | Status: AC
  Administered 2019-05-13: 3 mL via INTRAVENOUS

## 2019-05-12 MED ORDER — SODIUM CHLORIDE 0.9 % IV BOLUS
1000.0000 mL | Freq: Once | INTRAVENOUS | Status: AC
  Administered 2019-05-12: 1000 mL via INTRAVENOUS

## 2019-05-12 MED ORDER — SODIUM CHLORIDE 0.9 % IV SOLN
1.0000 g | Freq: Once | INTRAVENOUS | Status: AC
  Administered 2019-05-12: 1 g via INTRAVENOUS
  Filled 2019-05-12: qty 10

## 2019-05-12 MED ORDER — LIDOCAINE 5 % EX PTCH
3.0000 | MEDICATED_PATCH | CUTANEOUS | Status: DC
  Administered 2019-05-12: 3 via TRANSDERMAL
  Filled 2019-05-12: qty 3
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
## 2019-05-12 NOTE — ED Notes (Signed)
Pt successfully completed PO challenge

## 2019-05-12 NOTE — ED Notes (Signed)
Pt ambulated successfully from room to restroom independently with walker.

## 2019-05-12 NOTE — ED Triage Notes (Addendum)
Pt felt dizzy as he was turning to sit on his bed and fell to the ground. EMS states he was here last night for fall as well. Complains of L hip pain yesterday and today. CBG 460. BP 160/96, HR 120, 96% on room air. 98.4 temp.  Pt informed RN he is diabetic, but cannot afford medications
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
## 2019-05-13 ENCOUNTER — Other Ambulatory Visit: Payer: Self-pay

## 2019-05-13 ENCOUNTER — Encounter (HOSPITAL_COMMUNITY): Payer: Self-pay | Admitting: Emergency Medicine

## 2019-05-13 DIAGNOSIS — N390 Urinary tract infection, site not specified: Secondary | ICD-10-CM

## 2019-05-13 DIAGNOSIS — M1990 Unspecified osteoarthritis, unspecified site: Secondary | ICD-10-CM

## 2019-05-13 DIAGNOSIS — N3000 Acute cystitis without hematuria: Secondary | ICD-10-CM

## 2019-05-13 DIAGNOSIS — R739 Hyperglycemia, unspecified: Secondary | ICD-10-CM | POA: Insufficient documentation

## 2019-05-13 DIAGNOSIS — E119 Type 2 diabetes mellitus without complications: Secondary | ICD-10-CM

## 2019-05-13 DIAGNOSIS — A419 Sepsis, unspecified organism: Secondary | ICD-10-CM | POA: Diagnosis present

## 2019-05-13 DIAGNOSIS — M25552 Pain in left hip: Secondary | ICD-10-CM

## 2019-05-13 HISTORY — DX: Unspecified osteoarthritis, unspecified site: M19.90

## 2019-05-13 LAB — CBG MONITORING, ED: Glucose-Capillary: 350 mg/dL — ABNORMAL HIGH (ref 70–99)

## 2019-05-13 LAB — LACTIC ACID, PLASMA
Lactic Acid, Venous: 1.9 mmol/L (ref 0.5–1.9)
Lactic Acid, Venous: 1.6 mmol/L (ref 0.5–1.9)

## 2019-05-13 LAB — CBC
HCT: 44.1 % (ref 39.0–52.0)
Hemoglobin: 15.2 g/dL (ref 13.0–17.0)
MCH: 30.5 pg (ref 26.0–34.0)
MCHC: 34.5 g/dL (ref 30.0–36.0)
MCV: 88.4 fL (ref 80.0–100.0)
Platelets: 127 10*3/uL — ABNORMAL LOW (ref 150–400)
RBC: 4.99 MIL/uL (ref 4.22–5.81)
RDW: 13.9 % (ref 11.5–15.5)
WBC: 12.7 10*3/uL — ABNORMAL HIGH (ref 4.0–10.5)
nRBC: 0 % (ref 0.0–0.2)

## 2019-05-13 LAB — COMPREHENSIVE METABOLIC PANEL
ALT: 39 U/L (ref 0–44)
AST: 48 U/L — ABNORMAL HIGH (ref 15–41)
Albumin: 2.2 g/dL — ABNORMAL LOW (ref 3.5–5.0)
Alkaline Phosphatase: 141 U/L — ABNORMAL HIGH (ref 38–126)
Anion gap: 11 (ref 5–15)
BUN: 14 mg/dL (ref 6–20)
CO2: 22 mmol/L (ref 22–32)
Calcium: 8.4 mg/dL — ABNORMAL LOW (ref 8.9–10.3)
Chloride: 99 mmol/L (ref 98–111)
Creatinine, Ser: 0.71 mg/dL (ref 0.61–1.24)
GFR calc Af Amer: >60 mL/min (ref >60)
GFR calc non Af Amer: >60 mL/min (ref >60)
Glucose, Bld: 373 mg/dL — ABNORMAL HIGH (ref 70–99)
Potassium: 4.4 mmol/L (ref 3.5–5.1)
Sodium: 132 mmol/L — ABNORMAL LOW (ref 135–145)
Total Bilirubin: 2.1 mg/dL — ABNORMAL HIGH (ref 0.3–1.2)
Total Protein: 6.4 g/dL — ABNORMAL LOW (ref 6.5–8.1)

## 2019-05-13 LAB — MAGNESIUM: Magnesium: 1.7 mg/dL (ref 1.7–2.4)

## 2019-05-13 LAB — PROCALCITONIN: Procalcitonin: 2.68 ng/mL

## 2019-05-13 LAB — GLUCOSE, CAPILLARY
Glucose-Capillary: 238 mg/dL — ABNORMAL HIGH (ref 70–99)
Glucose-Capillary: 241 mg/dL — ABNORMAL HIGH (ref 70–99)
Glucose-Capillary: 252 mg/dL — ABNORMAL HIGH (ref 70–99)
Glucose-Capillary: 254 mg/dL — ABNORMAL HIGH (ref 70–99)
Glucose-Capillary: 330 mg/dL — ABNORMAL HIGH (ref 70–99)
Glucose-Capillary: 355 mg/dL — ABNORMAL HIGH (ref 70–99)

## 2019-05-13 LAB — HEMOGLOBIN A1C
Hgb A1c MFr Bld: 11 % — ABNORMAL HIGH (ref 4.8–5.6)
Mean Plasma Glucose: 269 mg/dL

## 2019-05-13 LAB — HIV ANTIBODY (ROUTINE TESTING W REFLEX): HIV Screen 4th Generation wRfx: NONREACTIVE

## 2019-05-13 LAB — TROPONIN I (HIGH SENSITIVITY): Troponin I (High Sensitivity): 6 ng/L (ref <18)

## 2019-05-13 LAB — SARS CORONAVIRUS 2 (TAT 6-24 HRS): SARS Coronavirus 2: NEGATIVE

## 2019-05-13 MED ORDER — KETOROLAC TROMETHAMINE 30 MG/ML IJ SOLN
30.0000 mg | Freq: Four times a day (QID) | INTRAMUSCULAR | Status: DC | PRN
  Administered 2019-05-15: 30 mg via INTRAVENOUS
  Filled 2019-05-13: qty 1

## 2019-05-13 MED ORDER — SODIUM CHLORIDE 0.9% FLUSH
3.0000 mL | Freq: Two times a day (BID) | INTRAVENOUS | Status: DC
  Administered 2019-05-13 – 2019-05-17 (×3): 3 mL via INTRAVENOUS

## 2019-05-13 MED ORDER — SODIUM CHLORIDE 0.9 % IV SOLN
INTRAVENOUS | Status: DC
  Administered 2019-05-13 – 2019-05-15 (×7): via INTRAVENOUS

## 2019-05-13 MED ORDER — MORPHINE SULFATE (PF) 2 MG/ML IV SOLN
2.0000 mg | INTRAVENOUS | Status: DC | PRN
  Administered 2019-05-13 – 2019-05-16 (×10): 2 mg via INTRAVENOUS
  Filled 2019-05-13 (×12): qty 1

## 2019-05-13 MED ORDER — INSULIN GLARGINE 100 UNIT/ML ~~LOC~~ SOLN
20.0000 [IU] | Freq: Every day | SUBCUTANEOUS | Status: DC
  Administered 2019-05-13 – 2019-05-14 (×2): 20 [IU] via SUBCUTANEOUS
  Filled 2019-05-13 (×2): qty 0.2

## 2019-05-13 MED ORDER — ENOXAPARIN SODIUM 40 MG/0.4ML ~~LOC~~ SOLN
40.0000 mg | SUBCUTANEOUS | Status: DC
  Administered 2019-05-13: 40 mg via SUBCUTANEOUS
  Filled 2019-05-13: qty 0.4

## 2019-05-13 MED ORDER — INSULIN ASPART 100 UNIT/ML ~~LOC~~ SOLN
7.0000 [IU] | Freq: Three times a day (TID) | SUBCUTANEOUS | Status: DC
  Administered 2019-05-13 – 2019-05-14 (×4): 7 [IU] via SUBCUTANEOUS

## 2019-05-13 MED ORDER — ACETAMINOPHEN 325 MG PO TABS
650.0000 mg | ORAL_TABLET | Freq: Four times a day (QID) | ORAL | Status: DC | PRN
  Administered 2019-05-13 – 2019-05-15 (×4): 650 mg via ORAL
  Filled 2019-05-13 (×3): qty 2

## 2019-05-13 MED ORDER — INSULIN ASPART 100 UNIT/ML ~~LOC~~ SOLN
0.0000 [IU] | SUBCUTANEOUS | Status: DC
  Administered 2019-05-13: 8 [IU] via SUBCUTANEOUS
  Administered 2019-05-13: 15 [IU] via SUBCUTANEOUS
  Administered 2019-05-13: 5 [IU] via SUBCUTANEOUS
  Administered 2019-05-13: 8 [IU] via SUBCUTANEOUS
  Administered 2019-05-13: 15 [IU] via SUBCUTANEOUS
  Administered 2019-05-14 (×4): 5 [IU] via SUBCUTANEOUS
  Administered 2019-05-14: 3 [IU] via SUBCUTANEOUS
  Administered 2019-05-14: 5 [IU] via SUBCUTANEOUS
  Administered 2019-05-15 (×6): 3 [IU] via SUBCUTANEOUS
  Administered 2019-05-16: 8 [IU] via SUBCUTANEOUS
  Administered 2019-05-16 (×3): 3 [IU] via SUBCUTANEOUS
  Administered 2019-05-16: 5 [IU] via SUBCUTANEOUS
  Administered 2019-05-16: 2 [IU] via SUBCUTANEOUS
  Administered 2019-05-17: 3 [IU] via SUBCUTANEOUS
  Administered 2019-05-17: 5 [IU] via SUBCUTANEOUS
  Administered 2019-05-17 (×2): 3 [IU] via SUBCUTANEOUS

## 2019-05-13 MED ORDER — INSULIN ASPART 100 UNIT/ML ~~LOC~~ SOLN
4.0000 [IU] | Freq: Once | SUBCUTANEOUS | Status: AC
  Administered 2019-05-13: 4 [IU] via SUBCUTANEOUS

## 2019-05-13 MED ORDER — SODIUM CHLORIDE 0.9% FLUSH: 3.0000 mL | INTRAVENOUS | Status: DC | PRN

## 2019-05-13 MED ORDER — SODIUM CHLORIDE 0.9 % IV SOLN
1.0000 g | INTRAVENOUS | Status: AC
  Administered 2019-05-13 – 2019-05-15 (×3): 1 g via INTRAVENOUS
  Filled 2019-05-13: qty 1
  Filled 2019-05-13: qty 10
  Filled 2019-05-13: qty 1

## 2019-05-13 MED ORDER — SODIUM CHLORIDE 0.9 % IV SOLN: 250.0000 mL | INTRAVENOUS | Status: DC | PRN

## 2019-05-13 NOTE — Progress Notes (Signed)
Brief hospitalist progress note:  Patient seen at bedside and continues to endorse mild left hip pain as well as a mild headache.  Patient denies dysuria but does recall very dark and foul-smelling urine the last few days.  He denies fevers, chills, shortness of breath, nausea or vomiting.  On exam patient is resting in bed comfortably in no acute distress and has just finished his lunch.  Heart sounds are normal, lungs are clear to auscultation bilaterally.  Abdomen is obese but soft, nontender and nondistended.  Left lateral hip is mildly tender to palpation.  No evidence of lower extremity edema.  Plan: I have started the patient on basal and bolus insulin regimen given his hyperglycemia and A1c greater than 11%.  This will likely need to be titrated up over the coming hours and days.  Continue IV antibiotics for the patient's sepsis due to urinary tract infection.  We will follow up blood and urine cultures.  Patient will benefit from a case management consult and likely physical therapy and Occupational Therapy evaluation.  Charolotte Capuchin, MD [facility]
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
## 2019-05-13 NOTE — Progress Notes (Signed)
Rehab Admissions Coordinator Note:  Patient was screened by Cleatrice Burke for appropriateness for an Inpatient Acute Rehab Consult per PT recs. Patient has not demonstrated the tolerance for more intense therapies at this time. I will follow.  Cleatrice Burke 05/13/2019, 6:22 PM  I can be reached at [phone]

## 2019-05-13 NOTE — TOC Initial Note (Signed)
Transition of Care [facility]) - Initial/Assessment Note    Patient Details  Name: Robert Sullivan MRN: [id number] Date of Birth: Feb 17, 1966  Transition of Care [facility]) CM/SW Contact:    Bartholomew Crews, RN Phone Number: [phone] 05/13/2019, 3:59 PM  Clinical Narrative:                 Spoke with patient at the bedside. PTA home with spouse and teenagers - 52 yo daughter and 9 yo son. Self-employed at [employer]. Verified no insurance, no PCP. Agreeable to clinic appointment - scheduled for 11/25 1:50pm. Has walker at home that was ordered from recent admission. TOC following for transition needs.   Expected Discharge Plan: [place] Barriers to Discharge: Continued Medical Work up   Patient Goals and CMS Choice Patient states their goals for this hospitalization and ongoing recovery are:: return home with family      Expected Discharge Plan and Services Expected Discharge Plan: [place] In-house Referral: Clinical Social Work, Development worker, community Discharge Planning Services: CM Consult, [facility]   Living arrangements for the past 2 months: [place]                 DME Arranged: N/A DME Agency: NA                  Prior Living Arrangements/Services Living arrangements for the past 2 months: Single Family Home Lives with:: Self, Minor Children, Spouse Patient language and need for interpreter reviewed:: Yes Do you feel safe going back to the place where you live?: Yes      Need for Family Participation in Patient Care: Yes (Comment) Care giver support system in place?: Yes (comment) Current home services: DME(has walker provided last admit) Criminal Activity/Legal Involvement Pertinent to Current Situation/Hospitalization: No - Comment as needed  Activities of Daily Living Home Assistive Devices/Equipment: None, Eyeglasses, Crutches, Walker (specify type) ADL Screening (condition at time of  admission) Patient's cognitive ability adequate to safely complete daily activities?: Yes Is the patient deaf or have difficulty hearing?: No Does the patient have difficulty seeing, even when wearing glasses/contacts?: No Does the patient have difficulty concentrating, remembering, or making decisions?: No Patient able to express need for assistance with ADLs?: Yes Does the patient have difficulty dressing or bathing?: No Independently performs ADLs?: Yes (appropriate for developmental age) Does the patient have difficulty walking or climbing stairs?: Yes Weakness of Legs: Both Weakness of Arms/Hands: None  Permission Sought/Granted                  Emotional Assessment Appearance:: Appears stated age Attitude/Demeanor/Rapport: Engaged Affect (typically observed): Accepting Orientation: : Oriented to Self, Oriented to Place, Oriented to  Time, Oriented to Situation Alcohol / Substance Use: Not Applicable Psych Involvement: No (comment)  Admission diagnosis:  Hyperglycemia [R73.9] Acute cystitis without hematuria [N30.00] Hip pain, acute, left [M25.552] Patient Active Problem List   Diagnosis Date Noted  . Sepsis secondary to UTI ([place]) 05/13/2019  . Acute lower UTI 05/13/2019  . Type 2 diabetes mellitus without complication ([place]) 54/00/8676  . Hip pain, acute, left 05/13/2019  . Sepsis ([place]) 05/13/2019  . Acute cystitis without hematuria   . Hyperglycemia    PCP:  Patient, No Pcp Per Pharmacy:   [id number]: [phone]: [phone]    Social Determinants of Health (SDOH) Interventions  Readmission Risk Interventions No flowsheet data found.

## 2019-05-13 NOTE — Progress Notes (Signed)
New Admission Note:   Arrival Method:  stretcher Mental Orientation: alert x four Telemetry: box 20 Assessment: Completed Skin: bruises to left hip from fall SJ:GGEZ forearm Pain: none Tubes: none Safety Measures: Safety Fall Prevention Plan has been discussed Admission: Completed [facility] [place] Orientation: Patient has been orientated to the room, unit and staff.  Family: none at bedside  Orders have been reviewed and implemented. Will continue to monitor the patient. Call light has been placed within reach and bed alarm has been activated.   Rockie Neighbours BSN, RN Phone number: [phone]

## 2019-05-13 NOTE — Progress Notes (Signed)
Spoke with patient about moving over to low bed and patient refused stating hip hurts too much to be moving over to low bed.

## 2019-05-13 NOTE — Progress Notes (Signed)
Have messaged attending and NP earlier based on LA and BC were D/C, pt adm with sepsis/UTI, also followed up with night time RN

## 2019-05-13 NOTE — Evaluation (Signed)
Physical Therapy Evaluation Patient Details Name: Robert Sullivan MRN: [id number] DOB: 1966/05/23 Today's Date: 05/13/2019   History of Present Illness  53 yo male with onset of falls from lightheadedness and now L hip pain was admitted.  Has sepsis from UTI with lightheaded feelings, but also dramatic pain complaints with his L hip abductors both with resistance and stretch.  Had negative findings on L hip CT, CT of head and neck, and was Covid negative. PMHx:  L knee tricompartmental OA, DM,   Clinical Impression  Pt was seen for mobility and noted that he was painful to move L hip only in abd and adduction, both creating spots over TFL origin and glut medius.  He is having some edema in the area as well as significant pain and so applied ice packs and will see again tomorrow to work on standing and getting OOB.  Pt has specific history of being fine with L hip until his effort to get up from a tub was completed, and the next day was quite sensitive in the lateral L hip.  He has good L knee ligament integrity, no excessive ROM to L hip was noted, and has minor stretch pain in injury site with L hip flexion or ext.  Follow up as planned and referred to CIR for potential struggles to get walking again.      Follow Up Recommendations CIR    Equipment Recommendations  Rolling walker with 5" wheels    Recommendations for Other Services Rehab consult     Precautions / Restrictions Precautions Precautions: Fall Precaution Comments: painful to move L hip to abd/add Restrictions Weight Bearing Restrictions: No      Mobility  Bed Mobility Overal bed mobility: Needs Assistance Bed Mobility: Rolling(scooting) Rolling: Mod assist         General bed mobility comments: mod assist to scoot up in bed, pain with abd or add L hip  Transfers Overall transfer level: Needs assistance               General transfer comment: declined over pain on L hip  Ambulation/Gait              General Gait Details: pt could not attempt  Stairs            Wheelchair Mobility    Modified Rankin (Stroke Patients Only)       Balance Overall balance assessment: Needs assistance;History of Falls                                           Pertinent Vitals/Pain Pain Assessment: 0-10 Pain Score: 8  Pain Location: L lateral hip on glut med and TFL Pain Descriptors / Indicators: Tightness;Throbbing;Tender Pain Intervention(s): Limited activity within patient's tolerance;Monitored during session;Premedicated before session;Repositioned;Ice applied    Home Living Family/patient expects to be discharged to:: Private residence Living Arrangements: Spouse/significant other Available Help at Discharge: Family;Available 24 hours/day Type of Home: House Home Access: Stairs to enter Entrance Stairs-Rails: None Entrance Stairs-Number of Steps: 3 Home Layout: Two level Home Equipment: Walker - 2 wheels;Other (comment)(has other devices but unsure of where they are)      Prior Function Level of Independence: Independent               Hand Dominance   Dominant Hand: Right    Extremity/Trunk Assessment   Upper Extremity Assessment Upper Extremity  Assessment: Overall WFL for tasks assessed    Lower Extremity Assessment Lower Extremity Assessment: LLE deficits/detail LLE Deficits / Details: pain to actively or with assist move L hip to flex, abd and add LLE: Unable to fully assess due to pain LLE Coordination: decreased gross motor;decreased fine motor    Cervical / Trunk Assessment Cervical / Trunk Assessment: Other exceptions(no acute pain but has degenerative changes of c-spine)  Communication   Communication: No difficulties  Cognition Arousal/Alertness: Awake/alert Behavior During Therapy: WFL for tasks assessed/performed Overall Cognitive Status: Within Functional Limits for tasks assessed                                  General Comments: pt was quite helpful with PLOF and info about how he was injured      General Comments General comments (skin integrity, edema, etc.): Pt was seen for eval of LLE and note ligaments of L knee are intact, painful to actively abd L hip or adduct and stretch outer R hip    Exercises     Assessment/Plan    PT Assessment Patient needs continued PT services  PT Problem List Decreased strength;Decreased range of motion;Decreased activity tolerance;Decreased balance;Decreased mobility;Decreased coordination;Cardiopulmonary status limiting activity;Decreased skin integrity;Pain       PT Treatment Interventions DME instruction;Gait training;Stair training;Functional mobility training;Therapeutic activities;Therapeutic exercise;Balance training;Neuromuscular re-education;Patient/family education    PT Goals (Current goals can be found in the Care Plan section)  Acute Rehab PT Goals Patient Stated Goal: to get stronger and get home PT Goal Formulation: With patient Time For Goal Achievement: 05/27/19 Potential to Achieve Goals: Good    Frequency Min 3X/week   Barriers to discharge Inaccessible home environment;Decreased caregiver support home with family but steps are an issue with his pain in LLE    Co-evaluation               AM-PAC PT "6 Clicks" Mobility  Outcome Measure Help needed turning from your back to your side while in a flat bed without using bedrails?: A Lot Help needed moving from lying on your back to sitting on the side of a flat bed without using bedrails?: A Lot Help needed moving to and from a bed to a chair (including a wheelchair)?: A Lot Help needed standing up from a chair using your arms (e.g., wheelchair or bedside chair)?: A Lot Help needed to walk in hospital room?: A Lot Help needed climbing 3-5 steps with a railing? : Total 6 Click Score: 11    End of Session Equipment Utilized During Treatment: Other (comment)(Ice on L hip and  thigh) Activity Tolerance: Patient limited by pain Patient left: in bed;with call bell/phone within reach;with bed alarm set Nurse Communication: Mobility status;Other (comment)(ice to L thigh) PT Visit Diagnosis: Muscle weakness (generalized) (M62.81);Pain;Unsteadiness on feet (R26.81);Other abnormalities of gait and mobility (R26.89);Repeated falls (R29.6);History of falling (Z91.81);Dizziness and giddiness (R42) Pain - Right/Left: Left Pain - part of body: Hip    Time: 9233-0076 PT Time Calculation (min) (ACUTE ONLY): 25 min   Charges:   PT Evaluation $PT Eval Moderate Complexity: 1 Mod PT Treatments $Therapeutic Exercise: 8-22 mins       Ramond Dial 05/13/2019, 4:37 PM   Mee Hives, PT MS Acute Rehab Dept. Number: [place]
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
## 2019-05-14 ENCOUNTER — Inpatient Hospital Stay (HOSPITAL_COMMUNITY): Payer: Self-pay

## 2019-05-14 LAB — COMPREHENSIVE METABOLIC PANEL
ALT: 35 U/L (ref 0–44)
AST: 60 U/L — ABNORMAL HIGH (ref 15–41)
Albumin: 1.8 g/dL — ABNORMAL LOW (ref 3.5–5.0)
Alkaline Phosphatase: 146 U/L — ABNORMAL HIGH (ref 38–126)
Anion gap: 7 (ref 5–15)
BUN: 15 mg/dL (ref 6–20)
CO2: 23 mmol/L (ref 22–32)
Calcium: 8 mg/dL — ABNORMAL LOW (ref 8.9–10.3)
Chloride: 104 mmol/L (ref 98–111)
Creatinine, Ser: 0.71 mg/dL (ref 0.61–1.24)
GFR calc Af Amer: >60 mL/min (ref >60)
GFR calc non Af Amer: >60 mL/min (ref >60)
Glucose, Bld: 239 mg/dL — ABNORMAL HIGH (ref 70–99)
Potassium: 4.2 mmol/L (ref 3.5–5.1)
Sodium: 134 mmol/L — ABNORMAL LOW (ref 135–145)
Total Bilirubin: 1.7 mg/dL — ABNORMAL HIGH (ref 0.3–1.2)
Total Protein: 5.7 g/dL — ABNORMAL LOW (ref 6.5–8.1)

## 2019-05-14 LAB — CBC
HCT: 40 % (ref 39.0–52.0)
Hemoglobin: 13.8 g/dL (ref 13.0–17.0)
MCH: 30.6 pg (ref 26.0–34.0)
MCHC: 34.5 g/dL (ref 30.0–36.0)
MCV: 88.7 fL (ref 80.0–100.0)
Platelets: 117 10*3/uL — ABNORMAL LOW (ref 150–400)
RBC: 4.51 MIL/uL (ref 4.22–5.81)
RDW: 13.8 % (ref 11.5–15.5)
WBC: 10.6 10*3/uL — ABNORMAL HIGH (ref 4.0–10.5)
nRBC: 0 % (ref 0.0–0.2)

## 2019-05-14 LAB — URINE CULTURE: Culture: >=100000 — AB

## 2019-05-14 LAB — GLUCOSE, CAPILLARY
Glucose-Capillary: 179 mg/dL — ABNORMAL HIGH (ref 70–99)
Glucose-Capillary: 199 mg/dL — ABNORMAL HIGH (ref 70–99)
Glucose-Capillary: 216 mg/dL — ABNORMAL HIGH (ref 70–99)
Glucose-Capillary: 236 mg/dL — ABNORMAL HIGH (ref 70–99)
Glucose-Capillary: 240 mg/dL — ABNORMAL HIGH (ref 70–99)
Glucose-Capillary: 248 mg/dL — ABNORMAL HIGH (ref 70–99)

## 2019-05-14 IMAGING — MR MR HIP*L* W/O CM
8 series · 35 of 40 positions shown · non-contrast
Comparison: CT of the left hip from [DATE]

CLINICAL DATA: Fall, left hip pain.

EXAM:
MR OF THE LEFT HIP WITHOUT CONTRAST
TECHNIQUE: Multiplanar, multisequence MR imaging was performed. No intravenous
contrast was administered.

[Series 5: T1 · coronal · left · 4.0mm · 1.17mm/px · 7 of 40 slices shown]
[im 1/40]
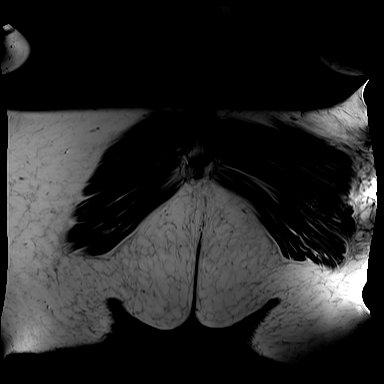
[im 7/40]
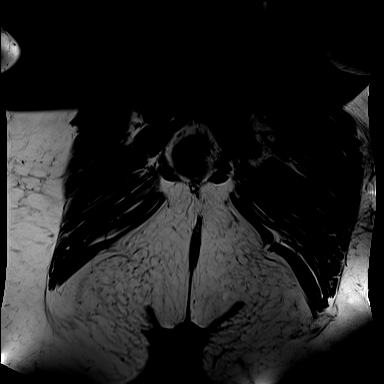
[im 14/40]
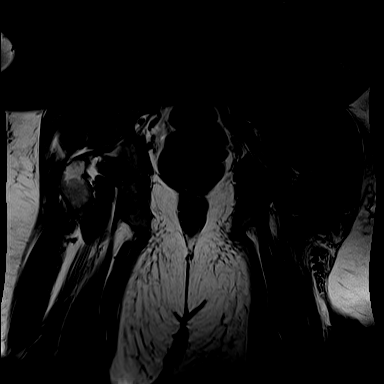
[im 20/40]
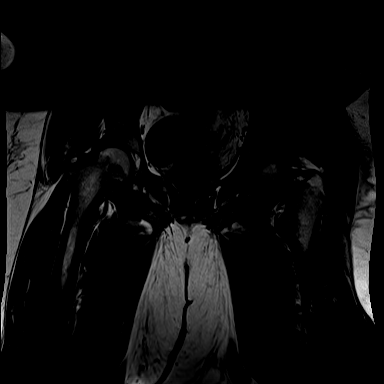
[im 27/40]
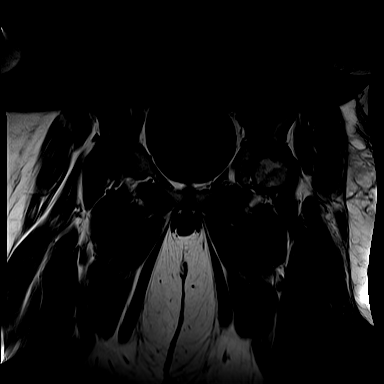
[im 33/40]
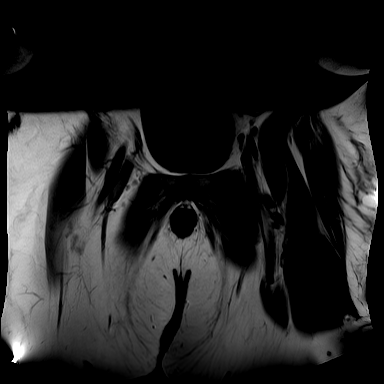
[im 40/40]
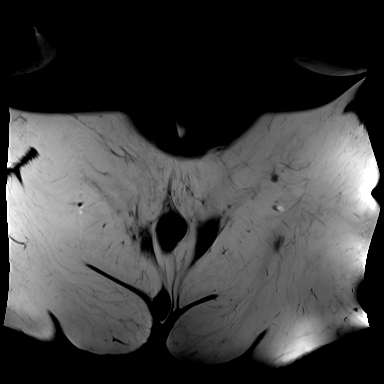

[Series 6: T2 fat-sat · coronal · left · 4.0mm · 1.00mm/px · 6 of 40 slices shown (1 of 3)]
[im 1/40]
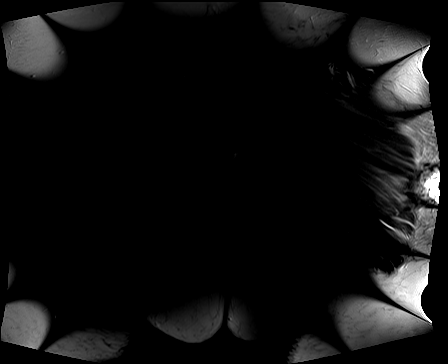
[im 8/40]
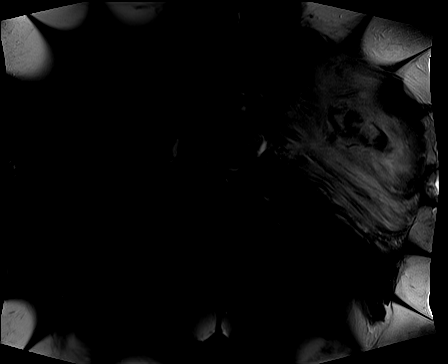
[im 16/40]
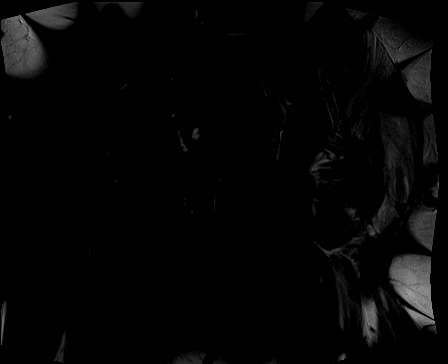
[im 24/40]
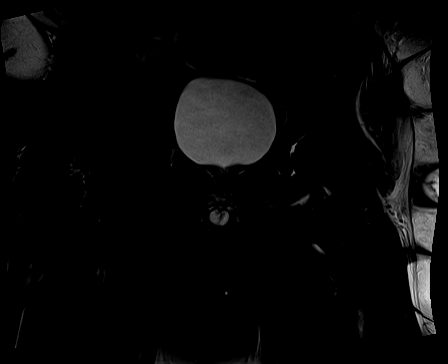
[im 32/40]
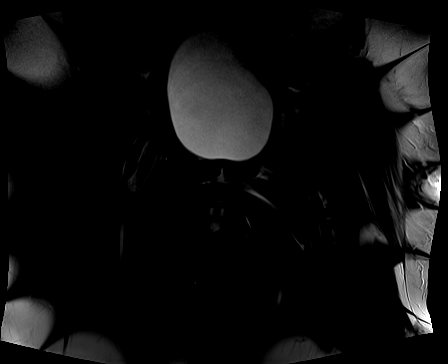
[im 40/40]
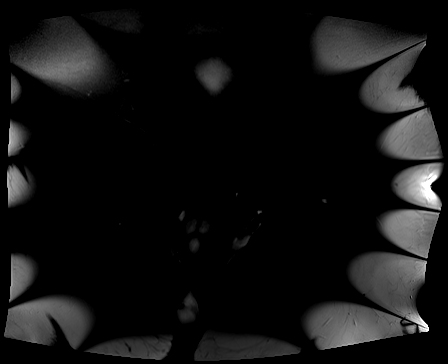

[Series 7: T2 fat-sat · axial · left · 4.0mm · 0.39mm/px · z∈[-32,+138]mm · 5 of 35 slices shown (2 of 3)]
[im 1/35]
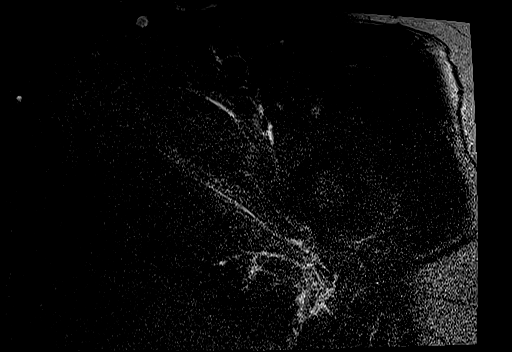
[im 9/35]
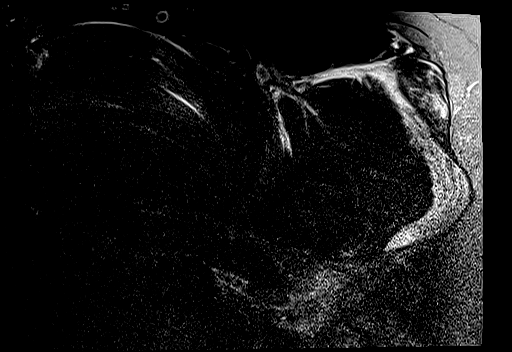
[im 18/35]
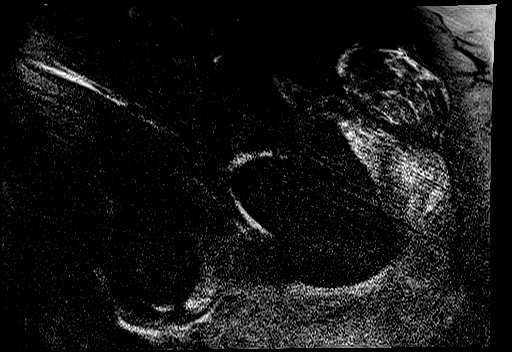
[im 26/35]
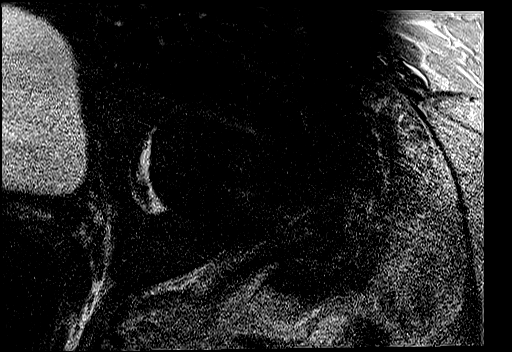
[im 35/35]
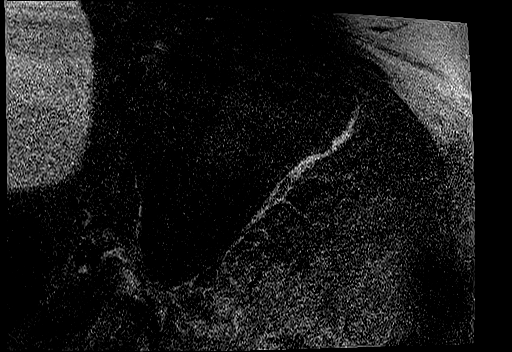

[Series 8: PD fat-sat · sagittal · left · 4.0mm · 0.62mm/px · 5 of 34 slices shown (1 of 3)]
[im 1/34]
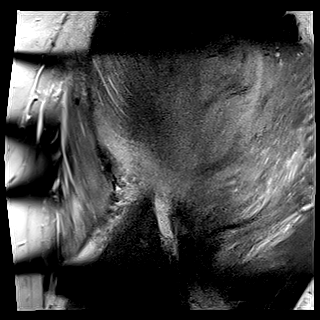
[im 9/34]
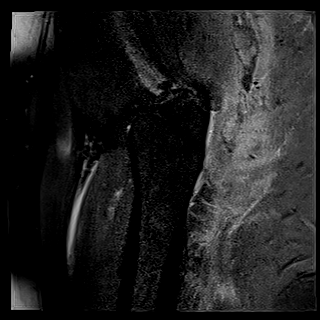
[im 17/34]
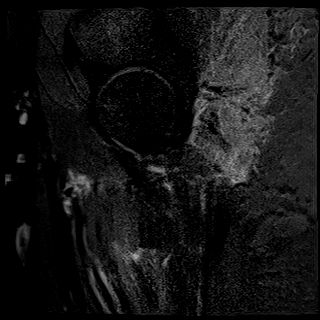
[im 25/34]
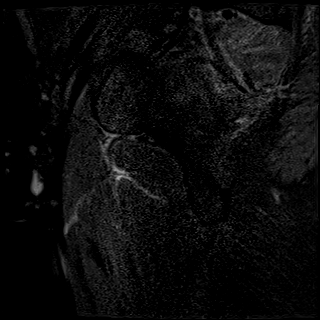
[im 34/34]
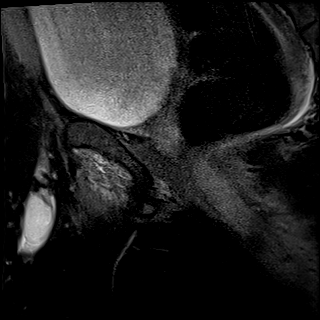

[Series 9: T2 fat-sat · axial · left · 4.0mm · 0.43mm/px · z∈[-32,+138]mm · 5 of 35 slices shown (3 of 3)]
[im 1/35]
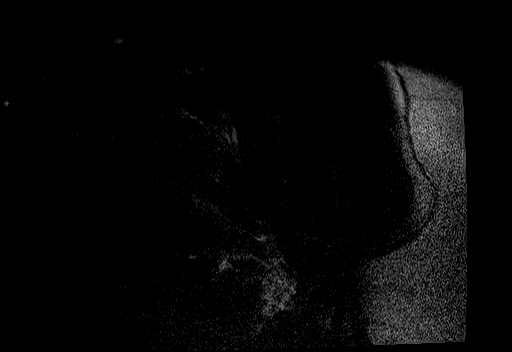
[im 9/35]
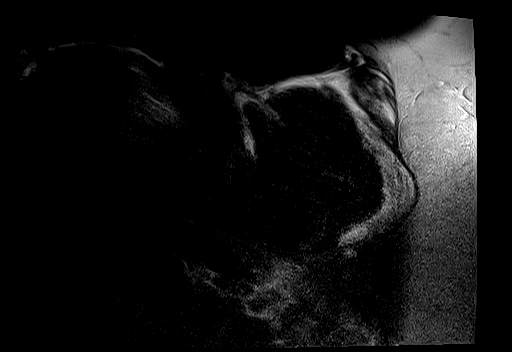
[im 18/35]
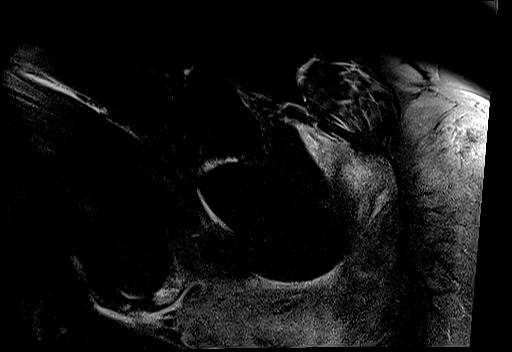
[im 26/35]
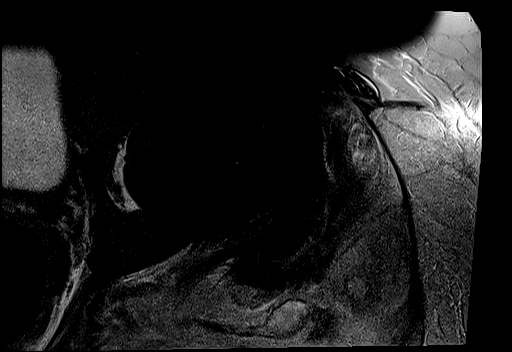
[im 35/35]
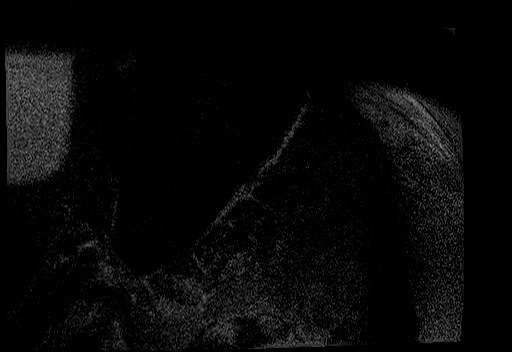

[Series 10: PD fat-sat · coronal · left · 3.0mm · 0.86mm/px · 3 of 22 slices shown (2 of 3)]
[im 1/22]
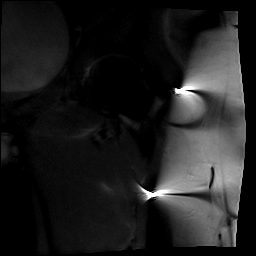
[im 11/22]
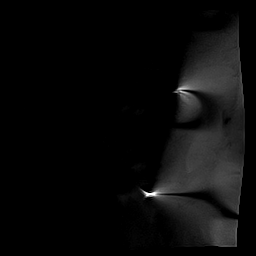
[im 22/22]
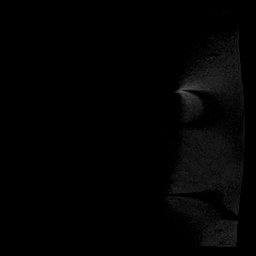

[Series 12: STIR · coronal · left · 6.0mm · 1.04mm/px · 1 of 40 slices shown]
[im 1/40]
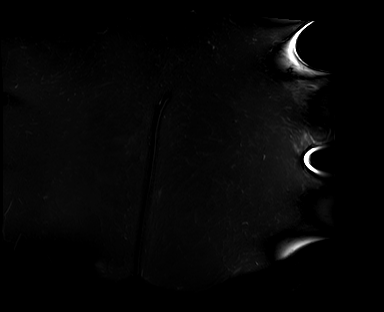

[Series 13: PD fat-sat · coronal · left · 3.0mm · 0.98mm/px · 3 of 22 slices shown (3 of 3)]
[im 1/22]
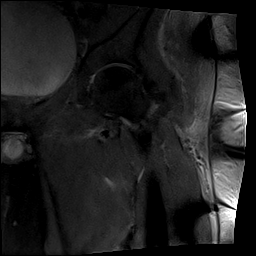
[im 11/22]
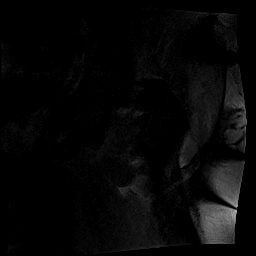
[im 22/22]
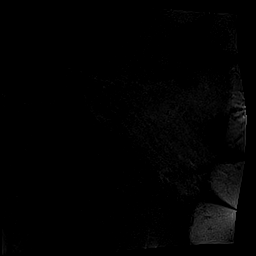

[35 of 40 positions shown; findings below may reference images not displayed]

FINDINGS: Bones: No fracture of the left proximal femur or regional pelvis
identified.

Articular cartilage and labrum

Articular cartilage: Mild craniocaudad degenerative articular
cartilage thinning.

Labrum: Motion artifact precludes sensitive assessment; no discrete
labral tear or paralabral cyst is identified.

Joint or bursal effusion

Joint effusion:  Absent

Bursae: No typical bursitis.

Muscles and tendons

Muscles and tendons: Mixed signal intensity 8.1 by 5.8 by 2.0 cm
(volume = 49 cm^3) masslike appearance in the left gluteus maximus
most compatible with hematoma. There is extensive surrounding edema
in the gluteus maximus compatible with muscle tear.

There is also considerable edema in the gluteus medius, tensor
fascia lata, left piriformis, and left hip adductor musculature
compatible with muscle strains or tears. Edema tracks along fascia
planes between the proximal vastus musculature and the rectus
femoris. Hamstring tendons intact.

Other findings

Miscellaneous:   No supplemental non-categorized findings.
IMPRESSION: 1. No fracture is identified.
2. Approximately 50 cc hematoma in the left gluteus maximus muscle
with left gluteus maximus tear. There is also considerable edema in
the gluteus medius, to tensor fascia lata, left hip adductor
musculature, left piriformis, and to a lesser extent along fascia
planes of the vastus musculature and rectus femoris compatible with
muscle strains or tears.
3. Mild degenerative chondral thinning in the left hip joint.

## 2019-05-14 MED ORDER — INSULIN GLARGINE 100 UNIT/ML ~~LOC~~ SOLN
24.0000 [IU] | Freq: Every day | SUBCUTANEOUS | Status: DC
  Administered 2019-05-15 – 2019-05-17 (×3): 24 [IU] via SUBCUTANEOUS
  Filled 2019-05-14 (×3): qty 0.24

## 2019-05-14 MED ORDER — INSULIN ASPART 100 UNIT/ML ~~LOC~~ SOLN
9.0000 [IU] | Freq: Three times a day (TID) | SUBCUTANEOUS | Status: DC
  Administered 2019-05-14 – 2019-05-17 (×10): 9 [IU] via SUBCUTANEOUS

## 2019-05-14 NOTE — Progress Notes (Signed)
  PROGRESS NOTE    Robert Sullivan  [id number]:[id number] DOB: 01-17-1966 DOA: 05/12/2019 PCP: Patient, No Pcp Per    Brief Narrative:  53 year old obese male initially brought in due to left hip discomfort and falls who was found to be septic due to urinary tract infection as well as hyperglycemic to the 300s.  Patient is now being managed with IV antibiotics for the UTI as well as insulin regimen to manage patient's uncontrolled diabetes.  Further imaging and PT OT are being utilized to further evaluate the patient's left hip pain which thus far have not revealed any fractures or dislocations.   Assessment & Plan:   Sepsis due to E. coli UTI -IV antibiotics  Type 2 diabetes with hyperglycemia -Continue to titrate basal bolus insulin regimen -Diabetic education  Left hip pain -Imaging of hip thus far does not reveal any fractures or dislocation -Patient is now amenable to an MRI of the left hip to further evaluate -PT/OT  Morbid obesity -Counseled on the importance of weight loss   DVT prophylaxis: Lovenox SQ Code Status:Full code   Consultants:   Physical medicine and rehabilitation  Procedures:   None  Antimicrobials:  IV ceftriaxone   Subjective: Patient denies any dysuria but continues to endorse left hip discomfort and limited mobility  Objective: Vitals:   05/13/19 1413 05/13/19 2040 05/14/19 0446 05/14/19 0812  BP: (!) 141/72 135/80 125/75 (!) 123/59  Pulse: (!) 109 (!) 112 92 97  Resp:  19 16 18   Temp: 98.3 F (36.8 C) 98.8 F (37.1 C) 98.2 F (36.8 C) 97.7 F (36.5 C)  TempSrc: Oral Oral  Oral  SpO2: 91% 95% 94% 95%  Weight:        Intake/Output Summary (Last 24 hours) at 05/14/2019 1512 Last data filed at 05/14/2019 0900 Gross per 24 hour  Intake 2255.54 ml  Output 1650 ml  Net 605.54 ml   Filed Weights   05/13/19 0450  Weight: (!) 166.4 kg    Examination:  General exam: Appears calm and comfortable  Respiratory system: Clear to  auscultation. Respiratory effort normal. Cardiovascular system: S1 & S2 heard, RRR. No JVD, murmurs, rubs, gallops or clicks. No pedal edema. Gastrointestinal system: Abdomen is obese but nondistended, soft and nontender. No organomegaly or masses felt. Normal bowel sounds heard. Central nervous system: Alert and oriented. No focal neurological deficits. Extremities: Symmetric 5 x 5 power.  Tender to palpation on left lateral hip region Skin: No rashes, lesions or ulcers Psychiatry: Judgement and insight appear normal. Mood & affect appropriate.      LOS: 1 day    Time spent: [place], MD [facility] Pager [phone]  If 7PM-7AM, please contact night-coverage [email or web address] Password [email or web address] 05/14/2019, 3:12 PM

## 2019-05-14 NOTE — Progress Notes (Signed)
PT Cancellation Note  Patient Details Name: Robert Sullivan MRN: [id number] DOB: 1966/06/11   Cancelled Treatment:    Reason Eval/Treat Not Completed: Patient at procedure or test/unavailable. Pt currently off unit at MRI. Will continue to follow and progress as able per POC.    Thelma Comp 05/14/2019, 2:23 PM   Rolinda Roan, PT, DPT Acute Rehabilitation Services Pager: [phone] Office: [phone]

## 2019-05-15 LAB — COMPREHENSIVE METABOLIC PANEL
ALT: 39 U/L (ref 0–44)
AST: 57 U/L — ABNORMAL HIGH (ref 15–41)
Albumin: 1.8 g/dL — ABNORMAL LOW (ref 3.5–5.0)
Alkaline Phosphatase: 142 U/L — ABNORMAL HIGH (ref 38–126)
Anion gap: 8 (ref 5–15)
BUN: 11 mg/dL (ref 6–20)
CO2: 23 mmol/L (ref 22–32)
Calcium: 8.2 mg/dL — ABNORMAL LOW (ref 8.9–10.3)
Chloride: 104 mmol/L (ref 98–111)
Creatinine, Ser: 0.59 mg/dL — ABNORMAL LOW (ref 0.61–1.24)
GFR calc Af Amer: >60 mL/min (ref >60)
GFR calc non Af Amer: >60 mL/min (ref >60)
Glucose, Bld: 215 mg/dL — ABNORMAL HIGH (ref 70–99)
Potassium: 4.1 mmol/L (ref 3.5–5.1)
Sodium: 135 mmol/L (ref 135–145)
Total Bilirubin: 1.3 mg/dL — ABNORMAL HIGH (ref 0.3–1.2)
Total Protein: 5.7 g/dL — ABNORMAL LOW (ref 6.5–8.1)

## 2019-05-15 LAB — GLUCOSE, CAPILLARY
Glucose-Capillary: 191 mg/dL — ABNORMAL HIGH (ref 70–99)
Glucose-Capillary: 175 mg/dL — ABNORMAL HIGH (ref 70–99)
Glucose-Capillary: 167 mg/dL — ABNORMAL HIGH (ref 70–99)
Glucose-Capillary: 195 mg/dL — ABNORMAL HIGH (ref 70–99)
Glucose-Capillary: 179 mg/dL — ABNORMAL HIGH (ref 70–99)

## 2019-05-15 LAB — CBC
HCT: 39 % (ref 39.0–52.0)
Hemoglobin: 13.8 g/dL (ref 13.0–17.0)
MCH: 30.7 pg (ref 26.0–34.0)
MCHC: 35.4 g/dL (ref 30.0–36.0)
MCV: 86.9 fL (ref 80.0–100.0)
Platelets: 124 10*3/uL — ABNORMAL LOW (ref 150–400)
RBC: 4.49 MIL/uL (ref 4.22–5.81)
RDW: 13.6 % (ref 11.5–15.5)
WBC: 9.6 10*3/uL (ref 4.0–10.5)
nRBC: 0 % (ref 0.0–0.2)

## 2019-05-15 MED ORDER — IBUPROFEN 400 MG PO TABS
400.0000 mg | ORAL_TABLET | ORAL | Status: DC | PRN
  Administered 2019-05-15 – 2019-05-17 (×4): 400 mg via ORAL
  Filled 2019-05-15 (×4): qty 1

## 2019-05-15 MED ORDER — LIVING WELL WITH DIABETES BOOK
Freq: Once | Status: DC
  Filled 2019-05-15: qty 1

## 2019-05-15 MED ORDER — INSULIN STARTER KIT- PEN NEEDLES (ENGLISH)
1.0000 | Freq: Once | Status: DC
  Filled 2019-05-15: qty 1

## 2019-05-15 MED ORDER — LIVING WELL WITH DIABETES BOOK
Freq: Once | Status: AC
  Administered 2019-05-15: 14:00:00
  Filled 2019-05-15: qty 1

## 2019-05-15 NOTE — Plan of Care (Signed)
  Problem: Activity: Goal: Risk for activity intolerance will decrease Outcome: Progressing   

## 2019-05-15 NOTE — Progress Notes (Signed)
Inpatient Diabetes Program Recommendations  AACE/ADA: New Consensus Statement on Inpatient Glycemic Control (2015)  Target Ranges:  Prepandial:   less than 140 mg/dL      Peak postprandial:   less than 180 mg/dL (1-2 hours)      Critically ill patients:  140 - 180 mg/dL   Lab Results  Component Value Date   GLUCAP 175 (H) 05/15/2019   HGBA1C 11.0 (H) 05/13/2019    Review of Glycemic Control  Diabetes history: DM2 Outpatient Diabetes medications: None Current orders for Inpatient glycemic control: Lantus 24 units QD, Novolog 0-15 units Q4H + 9 units tidwc  HgbA1C - 11% - Will need to go home on affordable insulin. Pt does not have insurance.  Inpatient Diabetes Program Recommendations:     Pt will need to go home on affordable insulin, may consider ReliOn 70/30 20 units bid and Novolin R for sliding scale. Will need to f/u with PCP for management of his diabetes. Will need glucose meter kit prescription and insulin pen needles at discharge.   Ordered insulin pen starter kit and Living Well book. Have tried to reach pt by phone this am but no answer. Will try again this am.   Continue to follow.   Thank you. Lorenda Peck, RD, LDN, CDE Inpatient Diabetes Coordinator [phone]

## 2019-05-15 NOTE — TOC Progression Note (Addendum)
Transition of Care [facility]) - Progression Note    Patient Details  Name: Robert Sullivan MRN: [id number] Date of Birth: 10-25-1965  [facility] CM/SW Contact  Bartholomew Crews, RN Phone Number: [phone] 05/15/2019, 3:50 PM  Clinical Narrative:    CM acknowledging consult for PCP and no insurance. Patient has an appointment with [facility] for 11/25 at 1:50pm - information is listed on his AVS and patient has been made aware. Diabetes Coordinator provided recommendations for patient to get Relion brand insulin at [employer] - 5 insulin pens at a cost of $42. For discharge medications, please use TOC pharmacy and NCM will follow for [facility]. If patient is likely to transition home over weekend, please send prescriptions to [facility] on Friday to be filled. TOC following to transition needs.   Update: Spoke with patient at bedside to discuss scheduled appointment with [place], using [facility] pharmacy at discharge, and charity PT through [facility]. Patient in agreement. [facility] notified of [facility] PT needs. Patient will need HH order for PT  Expected Discharge Plan: (CIR vs SNF vs HH) Barriers to Discharge: Inadequate or no insurance, Continued Medical Work up  Expected Discharge Plan and Services Expected Discharge Plan: (CIR vs SNF vs [facility]) In-house Referral: Clinical Social Work, Conservation officer, nature Services: CM Consult, [facility]   Living arrangements for the past 2 months: Single Family Home                 DME Arranged: N/A DME Agency: NA                   Social Determinants of Health (SDOH) Interventions    Readmission Risk Interventions No flowsheet data found.

## 2019-05-15 NOTE — Progress Notes (Signed)
Physical Therapy Treatment Patient Details Name: Robert Sullivan MRN: [id number] DOB: 12-18-1965 Today's Date: 05/15/2019    History of Present Illness 53 yo male with onset of falls from lightheadedness and now L hip pain was admitted.  Has sepsis from UTI with lightheaded feelings, but also dramatic pain complaints with his L hip abductors both with resistance and stretch.  Had negative findings on L hip CT, CT of head and neck, and was Covid negative. PMHx:  L knee tricompartmental OA, DM,     PT Comments    Pt was seen for ROM to LE's with no increase in pain, and with recent gait with nursing to walk to BR. Pt is reporting he did not need an AD to walk but will need to demonstrate ability to climb stairs for transition home.  Will focus on tolerance for activity with use of AD as needed.   Follow Up Recommendations  CIR     Equipment Recommendations       Recommendations for Other Services Rehab consult     Precautions / Restrictions Precautions Precautions: Fall Precaution Comments: monitor for pain with ROM Restrictions Weight Bearing Restrictions: No    Mobility  Bed Mobility               General bed mobility comments: declined to move OOB  Transfers                    Ambulation/Gait                 Stairs             Wheelchair Mobility    Modified Rankin (Stroke Patients Only)       Balance                                            Cognition Arousal/Alertness: Awake/alert Behavior During Therapy: WFL for tasks assessed/performed Overall Cognitive Status: Within Functional Limits for tasks assessed                                        Exercises Other Exercises Other Exercises: rom to L hip to flex, ext and rotate, duplicated on R LE    General Comments        Pertinent Vitals/Pain Pain Assessment: No/denies pain Pain Location: L hip tightness but pain reduced    Home Living                       Prior Function            PT Goals (current goals can now be found in the care plan section) Acute Rehab PT Goals Patient Stated Goal: go home    Frequency    Min 3X/week      PT Plan Current plan remains appropriate    Co-evaluation              AM-PAC PT "6 Clicks" Mobility   Outcome Measure  Help needed turning from your back to your side while in a flat bed without using bedrails?: A Little Help needed moving from lying on your back to sitting on the side of a flat bed without using bedrails?: A Little Help needed moving to and from a bed to  a chair (including a wheelchair)?: A Little Help needed standing up from a chair using your arms (e.g., wheelchair or bedside chair)?: A Little Help needed to walk in hospital room?: A Lot Help needed climbing 3-5 steps with a railing? : A Lot 6 Click Score: 16    End of Session   Activity Tolerance: Patient limited by fatigue Patient left: in bed;with call bell/phone within reach;with bed alarm set Nurse Communication: Mobility status PT Visit Diagnosis: Muscle weakness (generalized) (M62.81);Pain;Unsteadiness on feet (R26.81);Other abnormalities of gait and mobility (R26.89);Repeated falls (R29.6);History of falling (Z91.81);Dizziness and giddiness (R42) Pain - Right/Left: Left Pain - part of body: Hip     Time: 6168-3729 PT Time Calculation (min) (ACUTE ONLY): 29 min  Charges:  $Therapeutic Exercise: 23-37 mins                    Ramond Dial 05/15/2019, 8:43 PM   Mee Hives, PT MS Acute Rehab Dept. Number: [place] and [place]

## 2019-05-15 NOTE — Progress Notes (Signed)
Inpatient Diabetes Program Recommendations  AACE/ADA: New Consensus Statement on Inpatient Glycemic Control (2015)  Target Ranges:  Prepandial:   less than 140 mg/dL      Peak postprandial:   less than 180 mg/dL (1-2 hours)      Critically ill patients:  140 - 180 mg/dL   Lab Results  Component Value Date   GLUCAP 167 (H) 05/15/2019   HGBA1C 11.0 (H) 05/13/2019    Review of Glycemic Control  Spoke with pt at length regarding his diabetes and HgbA1C of 11%. Pt states he has been taking insulin that a friend of his in the [place] gave him and states "it has helped his blood sugars." Has texted friend to find out exactly what it is, that he has been taking at home. Pt states he was on insulin previously, but could not afford it now, since he has no insurance. Said he needs a new PCP to manage his diabetes. Has been yo-yo dieting for years, states he easily loses weight, but then gains it right back. Had been drinking sodas, eating pizza, but knows he can leave it off and eat healthy if he puts his mind to it. Made the statement "I'm not in my own reality right now." Has meter and strips to check blood sugars at home and instructed pt to check at least 3-4x/day and take logbook to MD for review. Gives insulin in abdomen. Has never had hypoglycemia, but we discussed s/s and treatment of hypo. Explained importance of eating healthy with portion control and variety of foods, getting daily exercise, stress management, and taking meds/insulin as prescribed. Discussed importance of reducing HgbA1C to around 7% to reduce risks of long-term complications. Pt seems frustrated, but willing to try and control his blood sugars. Needs affordable insulin.  Recommendations for discharge:  Novolin 70/30 (ReliOn) pen 20 units bid Novolin R (ReliOn) pen 0-15 units tidwc and HS Will need PCP for diabetes management Will need pen needles  Will again speak with pt this afternoon to see if he has any other  questions. Will order care management consult for assistance with obtaining PCP.  Will continue to follow.   Thank you. Lorenda Peck, RD, LDN, CDE Inpatient Diabetes Coordinator [phone]

## 2019-05-15 NOTE — Progress Notes (Signed)
  PROGRESS NOTE    Robert Sullivan  [id number] DOB: Dec 29, 1965 DOA: 05/12/2019 PCP: Patient, No Pcp Per    Brief Narrative:  53 year old obese male initially brought in due to left hip discomfort and falls who was found to be septic due to urinary tract infection as well as hyperglycemic to the 300s.  Patient is now being managed with IV antibiotics for the UTI as well as insulin regimen to manage patient's uncontrolled diabetes.  Further imaging and PT OT are being utilized to further evaluate the patient's left hip pain which thus far have not revealed any fractures or dislocations.   Assessment & Plan:   Sepsis due to E. coli UTI; no longer meeting sepsis criteria -IV antibiotics with plans to likely switch to p.o. antibiotics tomorrow  Type 2 diabetes with hyperglycemia -Continue to titrate basal bolus insulin regimen -Diabetic education  Left hip pain -Initial imaging of hip did not reveal any fractures or dislocation -Patient is now amenable to an MRI of the left hip to further evaluate: Revealed left gluteus maximus tear with 50 cc hematoma and mild surrounding edema -Continue conservative management, no indication for surgery at this time -PT/OT  Morbid obesity -Counseled on the importance of weight loss   DVT prophylaxis: Lovenox SQ Code Status:Full code   Consultants:   Physical medicine and rehabilitation  Procedures:   None  Antimicrobials:  IV ceftriaxone   Subjective: Patient denies any dysuria but continues to endorse left hip discomfort.  He states he was able to get up and walk to the restroom with a walker today which is more than he has done in quite some time.  Objective: Vitals:   05/14/19 1642 05/14/19 2005 05/15/19 0405 05/15/19 0841  BP: 134/79 135/82 133/74 (!) 141/87  Pulse: 97 96 92 89  Resp: 18 18 19 18   Temp: 98.1 F (36.7 C) 98.1 F (36.7 C) 98.3 F (36.8 C)   TempSrc: Oral Oral Oral   SpO2: 95% 94% 96% 96%  Weight:         Intake/Output Summary (Last 24 hours) at 05/15/2019 1548 Last data filed at 05/15/2019 0900 Gross per 24 hour  Intake 1879.93 ml  Output 951 ml  Net 928.93 ml   Filed Weights   05/13/19 0450  Weight: (!) 166.4 kg    Examination:  General exam: Appears calm and comfortable, morbidly obese Respiratory system: Clear to auscultation. Respiratory effort normal. Cardiovascular system: S1 & S2 heard, RRR. No JVD, murmurs, rubs, gallops or clicks. No pedal edema. Gastrointestinal system: Abdomen is obese but nondistended, soft and nontender. No organomegaly or masses felt. Normal bowel sounds heard. Central nervous system: Alert and oriented. No focal neurological deficits. Extremities: Symmetric 5 x 5 power.  Tender to palpation on left lateral hip region Skin: No rashes, lesions or ulcers Psychiatry: Judgement and insight appear normal. Mood & affect appropriate.      LOS: 2 days    Time spent: [place], MD [facility] Pager [phone]  If 7PM-7AM, please contact night-coverage [email or web address] Password [facility] 05/15/2019, 3:48 PM

## 2019-05-16 LAB — GLUCOSE, CAPILLARY
Glucose-Capillary: 141 mg/dL — ABNORMAL HIGH (ref 70–99)
Glucose-Capillary: 158 mg/dL — ABNORMAL HIGH (ref 70–99)
Glucose-Capillary: 168 mg/dL — ABNORMAL HIGH (ref 70–99)
Glucose-Capillary: 178 mg/dL — ABNORMAL HIGH (ref 70–99)
Glucose-Capillary: 189 mg/dL — ABNORMAL HIGH (ref 70–99)
Glucose-Capillary: 225 mg/dL — ABNORMAL HIGH (ref 70–99)
Glucose-Capillary: 258 mg/dL — ABNORMAL HIGH (ref 70–99)

## 2019-05-16 MED ORDER — INSULIN GLARGINE 100 UNIT/ML ~~LOC~~ SOLN: 24.0000 [IU] | Freq: Every day | SUBCUTANEOUS | 11 refills | Status: DC

## 2019-05-16 MED ORDER — INSULIN STARTER KIT- PEN NEEDLES (ENGLISH): 1.0000 | Freq: Once | 0 refills | Status: AC

## 2019-05-16 MED ORDER — INSULIN ASPART 100 UNIT/ML ~~LOC~~ SOLN: 9.0000 [IU] | Freq: Three times a day (TID) | SUBCUTANEOUS | 11 refills | Status: DC

## 2019-05-16 MED ORDER — SODIUM CHLORIDE 0.9 % IV SOLN
1.0000 g | INTRAVENOUS | Status: DC
  Administered 2019-05-16 – 2019-05-17 (×2): 1 g via INTRAVENOUS
  Filled 2019-05-16 (×2): qty 1

## 2019-05-16 MED ORDER — SULFAMETHOXAZOLE-TRIMETHOPRIM 800-160 MG PO TABS: 1.0000 | ORAL_TABLET | Freq: Two times a day (BID) | ORAL | 0 refills | Status: DC

## 2019-05-16 MED ORDER — SULFAMETHOXAZOLE-TRIMETHOPRIM 800-160 MG PO TABS: 1.0000 | ORAL_TABLET | Freq: Two times a day (BID) | ORAL | 0 refills | Status: AC

## 2019-05-16 MED FILL — NovoLOG 100 UNIT/ML SOLN: 100 | 28 days supply | Qty: 10 | Fill #0

## 2019-05-16 MED FILL — SULFAMETHOXAZOLE-TMP DS TAB: 800-160 | 6 days supply | Qty: 12 | Fill #0

## 2019-05-16 MED FILL — LANTUS 100 UNITS/ML VIAL: 100 | 28 days supply | Qty: 10 | Fill #0

## 2019-05-16 MED FILL — ULTICARE INS 0.3 ML 30GX1/2: 30G X 1/2" | 30 days supply | Qty: 120 | Fill #0

## 2019-05-16 NOTE — Plan of Care (Signed)
  Problem: Health Behavior/Discharge Planning: Goal: Ability to manage health-related needs will improve Outcome: Progressing   

## 2019-05-16 NOTE — Progress Notes (Signed)
  PROGRESS NOTE    Robert Sullivan  [id number]:[id number] DOB: 12-Jul-1965 DOA: 05/12/2019 PCP: Patient, No Pcp Per    Brief Narrative:  53 year old obese male initially brought in due to left hip discomfort and falls who was found to be septic due to urinary tract infection as well as hyperglycemic to the 300s.  Patient is now being managed with IV antibiotics for the UTI as well as insulin regimen to manage patient's uncontrolled diabetes.  Initial imaging of the left hip was negative for any dislocations or fractures however an MRI of the left hip that the patient was finally amenable to did reveal tear of the left gluteus maximus with 50 cc hematoma and some mild associated edema.  Conservative management has been implemented for this and the patient is gradually progressing as far as his mobility and pain is concerned.  Assessment & Plan:   Sepsis due to E. coli UTI; no longer meeting sepsis criteria -IV antibiotics with plans to likely switch to p.o. antibiotics tomorrow  Type 2 diabetes with hyperglycemia -Continue to titrate basal bolus insulin regimen -Diabetic education  Left hip pain -Initial imaging of hip did not reveal any fractures or dislocation -Patient is now amenable to an MRI of the left hip to further evaluate: Revealed left gluteus maximus tear with 50 cc hematoma and mild surrounding edema -Continue conservative management, no indication for surgery at this time -PT/OT: Recommend home health with PT and OT.  Social work and Tourist information centre manager is assisting as patient does not have insurance  Morbid obesity -Counseled on the importance of weight loss   DVT prophylaxis: Lovenox SQ Code Status:Full code   Consultants:   Physical medicine and rehabilitation  Procedures:   None  Antimicrobials:  IV ceftriaxone   Subjective: Patient denies any acute complaints today and states that he worked very aggressively with PT and OT.  He states he walked up and down the hall with a  walker.  He also states that he walked up and down some stairs with them.  Objective: Vitals:   05/15/19 1657 05/15/19 2005 05/16/19 0426 05/16/19 1135  BP: 132/76 (!) 148/84 130/76 122/66  Pulse: 88 92 87 (!) 107  Resp: 18 18 18    Temp: 98.6 F (37 C) 98 F (36.7 C) 97.9 F (36.6 C) 99.1 F (37.3 C)  TempSrc: Oral Oral  Oral  SpO2: 98% 97% 97% 99%  Weight:        Intake/Output Summary (Last 24 hours) at 05/16/2019 1622 Last data filed at 05/16/2019 1500 Gross per 24 hour  Intake 2840.89 ml  Output 400 ml  Net 2440.89 ml   Filed Weights   05/13/19 0450  Weight: (!) 166.4 kg    Examination:  General exam: Appears calm and comfortable, morbidly obese Respiratory system: Clear to auscultation. Respiratory effort normal. Cardiovascular system: S1 & S2 heard, RRR. No JVD, murmurs, rubs, gallops or clicks. No pedal edema. Gastrointestinal system: Abdomen is obese but nondistended, soft and nontender. No organomegaly or masses felt. Normal bowel sounds heard. Central nervous system: Alert and oriented. No focal neurological deficits. Extremities: Symmetric 5 x 5 power.  Tender to palpation on left lateral hip region Skin: No rashes, lesions or ulcers Psychiatry: Judgement and insight appear normal. Mood & affect appropriate.      LOS: 3 days    Time spent: [place], MD [facility] Pager [phone]  If 7PM-7AM, please contact night-coverage [email or web address] Password [facility] 05/16/2019, 4:22 PM

## 2019-05-16 NOTE — Progress Notes (Signed)
Physical Therapy Treatment Patient Details Name: Robert Sullivan MRN: [id number] DOB: 1966-05-04 Today's Date: 05/16/2019    History of Present Illness 53 yo male with onset of falls from lightheadedness and now L hip pain was admitted.  Has sepsis from UTI with lightheaded feelings, but also dramatic pain complaints with his L hip abductors both with resistance and stretch.  Had negative findings on L hip CT, CT of head and neck, and was Covid negative. PMHx:  L knee tricompartmental OA, DM,     PT Comments    Pt was seen for gait training on RW and stairs with rails, and talked with pt about his symptoms of tachycardia.  Had resting pulse 119, after gait was 142.  Pt is up in chair at the end, takes a few minutes to get settled and then received ice for L hip.  Pt is working on repositioning on chair and is voicing interest in direct transition home.  Will follow him up with PT as his stay requires, focusing on distances with gait and balance for uneven surfaces such as with stairs.     Follow Up Recommendations  Home health PT;Supervision for mobility/OOB     Equipment Recommendations  Rolling walker with 5" wheels    Recommendations for Other Services       Precautions / Restrictions Precautions Precautions: Fall Precaution Comments: care with stair climbing Restrictions Weight Bearing Restrictions: No    Mobility  Bed Mobility Overal bed mobility: Needs Assistance Bed Mobility: Supine to Sit Rolling: Min guard   Supine to sit: Min guard        Transfers Overall transfer level: Needs assistance Equipment used: Rolling walker (2 wheeled);1 person hand held assist Transfers: Sit to/from Stand Sit to Stand: From elevated surface;Mod assist;Min assist         General transfer comment: needed less help with more practice  Ambulation/Gait Ambulation/Gait assistance: Min guard Gait Distance (Feet): 100 Feet Assistive device: Rolling walker (2 wheeled);1 person hand  held assist Gait Pattern/deviations: Step-through pattern;Decreased stride length;Wide base of support;Trunk flexed Gait velocity: reduced Gait velocity interpretation: <1.31 ft/sec, indicative of household ambulator General Gait Details: pt was able to move walker alone, adjusted height for him   Stairs Stairs: Yes Stairs assistance: Min guard Stair Management: One rail Right;One rail Left;Forwards;Step to pattern Number of Stairs: 7 General stair comments: covered his distance with home needs inside   Wheelchair Mobility    Modified Rankin (Stroke Patients Only)       Balance Overall balance assessment: Needs assistance Sitting-balance support: Feet supported Sitting balance-Leahy Scale: Good     Standing balance support: Bilateral upper extremity supported;During functional activity Standing balance-Leahy Scale: Fair                              Cognition Arousal/Alertness: Awake/alert Behavior During Therapy: WFL for tasks assessed/performed Overall Cognitive Status: Within Functional Limits for tasks assessed                                        Exercises      General Comments General comments (skin integrity, edema, etc.): pt was seen for mobility and note his tolerance for gait on hall, but after stairs did take a short sitting rest      Pertinent Vitals/Pain Pain Assessment: Faces Faces Pain Scale: Hurts little more  Pain Location: L hip with stairs and extended walk Pain Descriptors / Indicators: Tender Pain Intervention(s): Limited activity within patient's tolerance;Monitored during session;Premedicated before session;Repositioned;Ice applied    Home Living                      Prior Function            PT Goals (current goals can now be found in the care plan section) Acute Rehab PT Goals Patient Stated Goal: go home Progress towards PT goals: Progressing toward goals    Frequency    Min  3X/week      PT Plan Discharge plan needs to be updated    Co-evaluation              AM-PAC PT "6 Clicks" Mobility   Outcome Measure  Help needed turning from your back to your side while in a flat bed without using bedrails?: A Little Help needed moving from lying on your back to sitting on the side of a flat bed without using bedrails?: A Little Help needed moving to and from a bed to a chair (including a wheelchair)?: A Little Help needed standing up from a chair using your arms (e.g., wheelchair or bedside chair)?: A Little Help needed to walk in hospital room?: A Little Help needed climbing 3-5 steps with a railing? : A Little 6 Click Score: 18    End of Session Equipment Utilized During Treatment: Gait belt;Other (comment)(ice L hip) Activity Tolerance: No increased pain;Patient limited by fatigue;Treatment limited secondary to medical complications (Comment)(SOB and pulse 142 with gait) Patient left: in chair;with call bell/phone within reach Nurse Communication: Mobility status PT Visit Diagnosis: Muscle weakness (generalized) (M62.81);Pain;Unsteadiness on feet (R26.81);Other abnormalities of gait and mobility (R26.89);Repeated falls (R29.6);History of falling (Z91.81);Dizziness and giddiness (R42) Pain - Right/Left: Left Pain - part of body: Hip     Time: 5638-9373 PT Time Calculation (min) (ACUTE ONLY): 54 min  Charges:  $Gait Training: 23-37 mins $Therapeutic Activity: 23-37 mins                    Ramond Dial 05/16/2019, 12:55 PM   Mee Hives, PT MS Acute Rehab Dept. Number: [place] [facility] [place]

## 2019-05-16 NOTE — TOC Progression Note (Signed)
Transition of Care [facility]) - Progression Note    Patient Details  Name: Robert Sullivan MRN: [id number] Date of Birth: Apr 21, 1966  Transition of Care [facility]) CM/SW Contact  Bartholomew Crews, RN Phone Number: [phone] 05/16/2019, 4:10 PM  Clinical Narrative:    Spoke with patient at bedside to discuss possible transition home tomorrow. Advised that TOC was filling scripts and would deliver to bedside today. [facility] aware of potential dc tomorrow. Patient will need HH PT orders with Face to Face. Patient already has RW. Patient verbalized progress with therapy and readiness to transition home. TOC following for transition needs.    Expected Discharge Plan: (CIR vs SNF vs HH) Barriers to Discharge: Inadequate or no insurance, Continued Medical Work up  Expected Discharge Plan and Services Expected Discharge Plan: (CIR vs SNF vs HH) In-house Referral: Clinical Social Work, Conservation officer, nature Services: CM Consult, [facility]   Living arrangements for the past 2 months: Single Family Home                 DME Arranged: N/A DME Agency: NA                   Social Determinants of Health (SDOH) Interventions    Readmission Risk Interventions No flowsheet data found.

## 2019-05-16 NOTE — Progress Notes (Signed)
Inpatient Rehabilitation Admissions Coordinator  Patient not in need of an inpt rehab admit prior to d/c home. Recommend Home with HH.  Danne Baxter, RN, MSN Rehab Admissions Coordinator [phone] 05/16/2019 8:41 AM

## 2019-05-17 DIAGNOSIS — N390 Urinary tract infection, site not specified: Secondary | ICD-10-CM

## 2019-05-17 DIAGNOSIS — E119 Type 2 diabetes mellitus without complications: Secondary | ICD-10-CM

## 2019-05-17 DIAGNOSIS — A419 Sepsis, unspecified organism: Secondary | ICD-10-CM

## 2019-05-17 DIAGNOSIS — M25552 Pain in left hip: Secondary | ICD-10-CM

## 2019-05-17 LAB — GLUCOSE, CAPILLARY
Glucose-Capillary: 203 mg/dL — ABNORMAL HIGH (ref 70–99)
Glucose-Capillary: 278 mg/dL — ABNORMAL HIGH (ref 70–99)
Glucose-Capillary: 219 mg/dL — ABNORMAL HIGH (ref 70–99)

## 2019-05-17 NOTE — Plan of Care (Signed)
  Problem: Activity: Goal: Risk for activity intolerance will decrease Outcome: Progressing   

## 2019-05-17 NOTE — TOC Transition Note (Signed)
Transition of Care [facility]) - CM/SW Discharge Note   Patient Details  Name: Robert Sullivan MRN: [id number] Date of Birth: 09/18/1965  [facility]) CM/SW Contact:  Carles Collet, RN Phone Number: 05/17/2019, 1:00 PM   Clinical Narrative:   Patient to DC to home, [facility] notified of DC.     Final next level of care: Home w Home Health Services Barriers to Discharge: No Barriers Identified   Patient Goals and CMS Choice Patient states their goals for this hospitalization and ongoing recovery are:: return home with family      Discharge Placement                       Discharge Plan and Services In-house Referral: Clinical Social Work, Development worker, community Discharge Planning Services: CM Consult, [facility]            DME Arranged: N/A DME Agency: NA       HH Arranged: RN, PT, OT, Nurse's Aide [place] Agency: [facility] Date HH Agency Contacted: 05/17/19 Time HH Agency Contacted: 5 Representative spoke with at [place]: Cassie  Social Determinants of Health ([place]) Interventions     Readmission Risk Interventions No flowsheet data found.
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
## 2019-05-18 LAB — CULTURE, BLOOD (ROUTINE X 2)
Culture: NO GROWTH
Culture: NO GROWTH
Special Requests: ADEQUATE
Special Requests: ADEQUATE

## 2019-06-04 ENCOUNTER — Inpatient Hospital Stay (INDEPENDENT_AMBULATORY_CARE_PROVIDER_SITE_OTHER): Payer: Self-pay | Admitting: Primary Care

## 2019-06-10 ENCOUNTER — Inpatient Hospital Stay (HOSPITAL_COMMUNITY)
Admission: EM | Admit: 2019-06-10 | Discharge: 2019-06-30 | DRG: 854 | Disposition: A | Discharge status: Home Health | Payer: Self-pay | Attending: Internal Medicine | Admitting: Internal Medicine

## 2019-06-10 ENCOUNTER — Encounter (HOSPITAL_COMMUNITY): Payer: Self-pay | Admitting: Emergency Medicine

## 2019-06-10 ENCOUNTER — Emergency Department (HOSPITAL_COMMUNITY): Payer: Self-pay

## 2019-06-10 ENCOUNTER — Inpatient Hospital Stay (HOSPITAL_COMMUNITY): Payer: Self-pay

## 2019-06-10 ENCOUNTER — Other Ambulatory Visit: Payer: Self-pay

## 2019-06-10 DIAGNOSIS — Z882 Allergy status to sulfonamides status: Secondary | ICD-10-CM

## 2019-06-10 DIAGNOSIS — M170 Bilateral primary osteoarthritis of knee: Secondary | ICD-10-CM | POA: Diagnosis present

## 2019-06-10 DIAGNOSIS — G4733 Obstructive sleep apnea (adult) (pediatric): Secondary | ICD-10-CM | POA: Diagnosis present

## 2019-06-10 DIAGNOSIS — Z452 Encounter for adjustment and management of vascular access device: Secondary | ICD-10-CM

## 2019-06-10 DIAGNOSIS — T368X5A Adverse effect of other systemic antibiotics, initial encounter: Secondary | ICD-10-CM | POA: Diagnosis present

## 2019-06-10 DIAGNOSIS — R7881 Bacteremia: Secondary | ICD-10-CM

## 2019-06-10 DIAGNOSIS — Z79899 Other long term (current) drug therapy: Secondary | ICD-10-CM

## 2019-06-10 DIAGNOSIS — IMO0002 Reserved for concepts with insufficient information to code with codable children: Secondary | ICD-10-CM | POA: Diagnosis present

## 2019-06-10 DIAGNOSIS — K5900 Constipation, unspecified: Secondary | ICD-10-CM | POA: Diagnosis present

## 2019-06-10 DIAGNOSIS — E1165 Type 2 diabetes mellitus with hyperglycemia: Secondary | ICD-10-CM | POA: Diagnosis present

## 2019-06-10 DIAGNOSIS — Z87440 Personal history of urinary (tract) infections: Secondary | ICD-10-CM

## 2019-06-10 DIAGNOSIS — L03317 Cellulitis of buttock: Secondary | ICD-10-CM | POA: Diagnosis present

## 2019-06-10 DIAGNOSIS — D62 Acute posthemorrhagic anemia: Secondary | ICD-10-CM | POA: Diagnosis not present

## 2019-06-10 DIAGNOSIS — E875 Hyperkalemia: Secondary | ICD-10-CM | POA: Diagnosis not present

## 2019-06-10 DIAGNOSIS — Z6841 Body Mass Index (BMI) 40.0 and over, adult: Secondary | ICD-10-CM

## 2019-06-10 DIAGNOSIS — I358 Other nonrheumatic aortic valve disorders: Secondary | ICD-10-CM | POA: Diagnosis present

## 2019-06-10 DIAGNOSIS — A419 Sepsis, unspecified organism: Secondary | ICD-10-CM | POA: Diagnosis present

## 2019-06-10 DIAGNOSIS — K7460 Unspecified cirrhosis of liver: Secondary | ICD-10-CM | POA: Diagnosis present

## 2019-06-10 DIAGNOSIS — Z20828 Contact with and (suspected) exposure to other viral communicable diseases: Secondary | ICD-10-CM | POA: Diagnosis present

## 2019-06-10 DIAGNOSIS — Z833 Family history of diabetes mellitus: Secondary | ICD-10-CM

## 2019-06-10 DIAGNOSIS — N390 Urinary tract infection, site not specified: Secondary | ICD-10-CM | POA: Diagnosis present

## 2019-06-10 DIAGNOSIS — R3129 Other microscopic hematuria: Secondary | ICD-10-CM | POA: Diagnosis not present

## 2019-06-10 DIAGNOSIS — M6008 Infective myositis, other site: Secondary | ICD-10-CM | POA: Diagnosis present

## 2019-06-10 DIAGNOSIS — L270 Generalized skin eruption due to drugs and medicaments taken internally: Secondary | ICD-10-CM | POA: Diagnosis present

## 2019-06-10 DIAGNOSIS — W182XXA Fall in (into) shower or empty bathtub, initial encounter: Secondary | ICD-10-CM | POA: Diagnosis present

## 2019-06-10 DIAGNOSIS — Z9119 Patient's noncompliance with other medical treatment and regimen: Secondary | ICD-10-CM

## 2019-06-10 DIAGNOSIS — Z794 Long term (current) use of insulin: Secondary | ICD-10-CM

## 2019-06-10 DIAGNOSIS — M009 Pyogenic arthritis, unspecified: Secondary | ICD-10-CM | POA: Diagnosis present

## 2019-06-10 DIAGNOSIS — E0865 Diabetes mellitus due to underlying condition with hyperglycemia: Secondary | ICD-10-CM

## 2019-06-10 DIAGNOSIS — A4101 Sepsis due to Methicillin susceptible Staphylococcus aureus: Principal | ICD-10-CM | POA: Diagnosis present

## 2019-06-10 DIAGNOSIS — R296 Repeated falls: Secondary | ICD-10-CM | POA: Diagnosis present

## 2019-06-10 DIAGNOSIS — D6959 Other secondary thrombocytopenia: Secondary | ICD-10-CM | POA: Diagnosis present

## 2019-06-10 DIAGNOSIS — S7002XA Contusion of left hip, initial encounter: Secondary | ICD-10-CM | POA: Diagnosis present

## 2019-06-10 DIAGNOSIS — B9561 Methicillin susceptible Staphylococcus aureus infection as the cause of diseases classified elsewhere: Secondary | ICD-10-CM

## 2019-06-10 DIAGNOSIS — E8770 Fluid overload, unspecified: Secondary | ICD-10-CM | POA: Diagnosis not present

## 2019-06-10 DIAGNOSIS — M7981 Nontraumatic hematoma of soft tissue: Secondary | ICD-10-CM | POA: Diagnosis present

## 2019-06-10 DIAGNOSIS — K7581 Nonalcoholic steatohepatitis (NASH): Secondary | ICD-10-CM | POA: Diagnosis present

## 2019-06-10 DIAGNOSIS — L0231 Cutaneous abscess of buttock: Secondary | ICD-10-CM | POA: Diagnosis present

## 2019-06-10 LAB — COMPREHENSIVE METABOLIC PANEL
ALT: 44 U/L (ref 0–44)
AST: 72 U/L — ABNORMAL HIGH (ref 15–41)
Albumin: 1.8 g/dL — ABNORMAL LOW (ref 3.5–5.0)
Alkaline Phosphatase: 224 U/L — ABNORMAL HIGH (ref 38–126)
Anion gap: 22 — ABNORMAL HIGH (ref 5–15)
BUN: 16 mg/dL (ref 6–20)
CO2: 14 mmol/L — ABNORMAL LOW (ref 22–32)
Calcium: 8.6 mg/dL — ABNORMAL LOW (ref 8.9–10.3)
Chloride: 95 mmol/L — ABNORMAL LOW (ref 98–111)
Creatinine, Ser: 1.09 mg/dL (ref 0.61–1.24)
GFR calc Af Amer: >60 mL/min (ref >60)
GFR calc non Af Amer: >60 mL/min (ref >60)
Glucose, Bld: 380 mg/dL — ABNORMAL HIGH (ref 70–99)
Potassium: 4.8 mmol/L (ref 3.5–5.1)
Sodium: 131 mmol/L — ABNORMAL LOW (ref 135–145)
Total Bilirubin: 1.7 mg/dL — ABNORMAL HIGH (ref 0.3–1.2)
Total Protein: 7.4 g/dL (ref 6.5–8.1)

## 2019-06-10 LAB — URINALYSIS, ROUTINE W REFLEX MICROSCOPIC
Glucose, UA: 50 mg/dL — AB
Ketones, ur: NEGATIVE mg/dL
Nitrite: POSITIVE — AB
Protein, ur: 100 mg/dL — AB
Specific Gravity, Urine: 1.026 (ref 1.005–1.030)
WBC, UA: >50 WBC/hpf — ABNORMAL HIGH (ref 0–5)
pH: 5 (ref 5.0–8.0)

## 2019-06-10 LAB — CBG MONITORING, ED
Glucose-Capillary: 266 mg/dL — ABNORMAL HIGH (ref 70–99)
Glucose-Capillary: 241 mg/dL — ABNORMAL HIGH (ref 70–99)

## 2019-06-10 LAB — CBC WITH DIFFERENTIAL/PLATELET
Abs Immature Granulocytes: 0.34 10*3/uL — ABNORMAL HIGH (ref 0.00–0.07)
Basophils Absolute: 0.1 10*3/uL (ref 0.0–0.1)
Basophils Relative: 0 %
Eosinophils Absolute: 0.1 10*3/uL (ref 0.0–0.5)
Eosinophils Relative: 1 %
HCT: 41 % (ref 39.0–52.0)
Hemoglobin: 13.6 g/dL (ref 13.0–17.0)
Immature Granulocytes: 2 %
Lymphocytes Relative: 5 %
Lymphs Abs: 1 10*3/uL (ref 0.7–4.0)
MCH: 30.6 pg (ref 26.0–34.0)
MCHC: 33.2 g/dL (ref 30.0–36.0)
MCV: 92.3 fL (ref 80.0–100.0)
Monocytes Absolute: 1.2 10*3/uL — ABNORMAL HIGH (ref 0.1–1.0)
Monocytes Relative: 6 %
Neutro Abs: 17.6 10*3/uL — ABNORMAL HIGH (ref 1.7–7.7)
Neutrophils Relative %: 86 %
Platelets: 198 10*3/uL (ref 150–400)
RBC: 4.44 MIL/uL (ref 4.22–5.81)
RDW: 14.2 % (ref 11.5–15.5)
WBC: 20.4 10*3/uL — ABNORMAL HIGH (ref 4.0–10.5)
nRBC: 0 % (ref 0.0–0.2)

## 2019-06-10 LAB — PROTIME-INR
INR: 1.4 — ABNORMAL HIGH (ref 0.8–1.2)
Prothrombin Time: 17.4 seconds — ABNORMAL HIGH (ref 11.4–15.2)

## 2019-06-10 LAB — CK: Total CK: 23 U/L — ABNORMAL LOW (ref 49–397)

## 2019-06-10 LAB — LACTIC ACID, PLASMA
Lactic Acid, Venous: 5.5 mmol/L (ref 0.5–1.9)
Lactic Acid, Venous: 2.6 mmol/L (ref 0.5–1.9)

## 2019-06-10 LAB — POC OCCULT BLOOD, ED: Fecal Occult Bld: POSITIVE — AB

## 2019-06-10 LAB — SARS CORONAVIRUS 2 (TAT 6-24 HRS): SARS Coronavirus 2: NEGATIVE

## 2019-06-10 LAB — D-DIMER, QUANTITATIVE: D-Dimer, Quant: 3.04 ug/mL-FEU — ABNORMAL HIGH (ref 0.00–0.50)

## 2019-06-10 LAB — APTT: aPTT: 30 seconds (ref 24–36)

## 2019-06-10 LAB — POC SARS CORONAVIRUS 2 AG -  ED: SARS Coronavirus 2 Ag: NEGATIVE

## 2019-06-10 IMAGING — CT CT HIP*L* W/O CM
2 of 6 series · 14 of 46 positions shown, 16 images · non-contrast
Comparison: [DATE] MRI

CLINICAL DATA: Hip swelling and infection

EXAM:
CT OF THE LEFT HIP WITHOUT CONTRAST
TECHNIQUE: Multidetector CT imaging of the left hip was performed according to
the standard protocol. Multiplanar CT image reconstructions were
also generated.

[Series 7: lt hip thin · axial · 0.79mm/px · z∈[+864,+1232]mm · 11 of 737 slices shown, 13 images]
[im 62/737  soft-tissue]
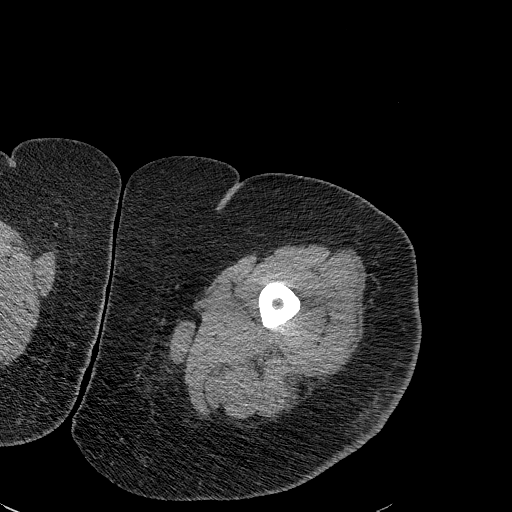
[im 62/737  bone]
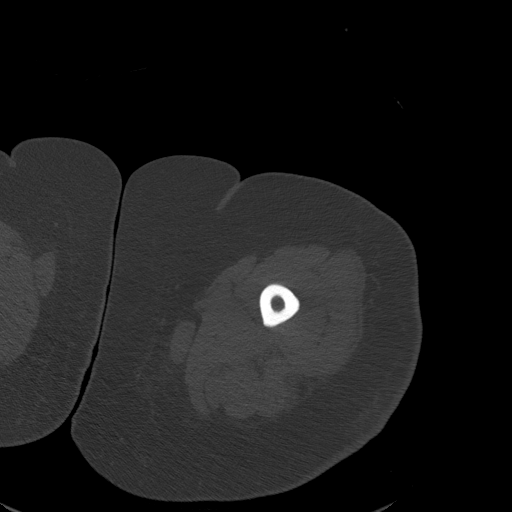
[im 123/737  soft-tissue]
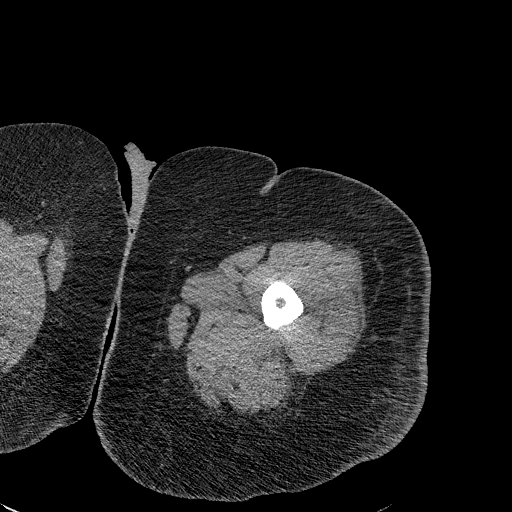
[im 185/737  soft-tissue]
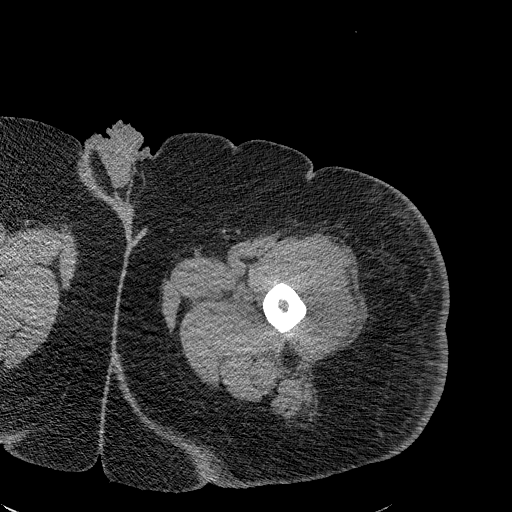
[im 246/737  soft-tissue]
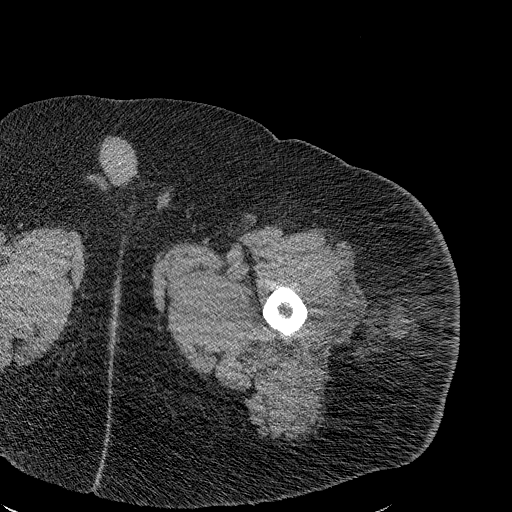
[im 307/737  soft-tissue]
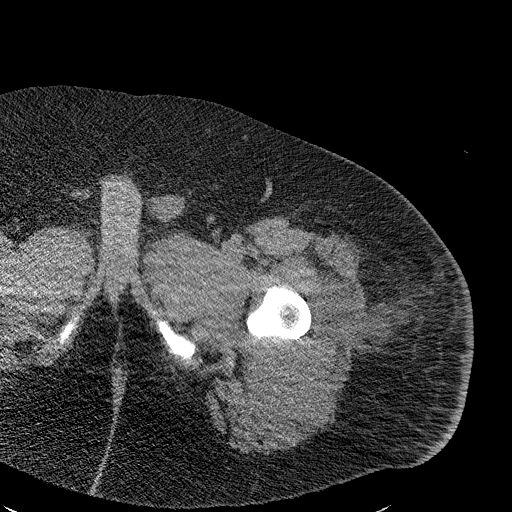
[im 369/737  soft-tissue]
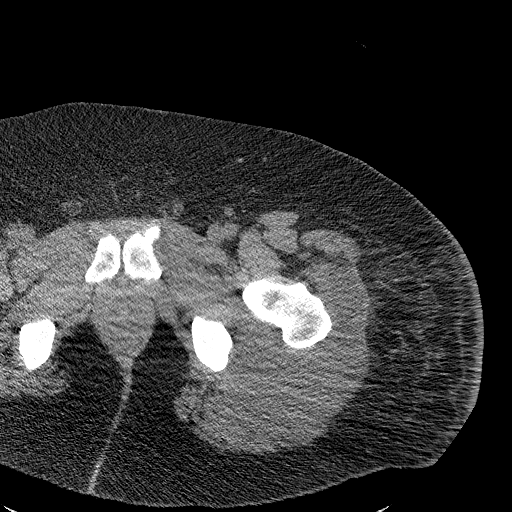
[im 430/737  soft-tissue]
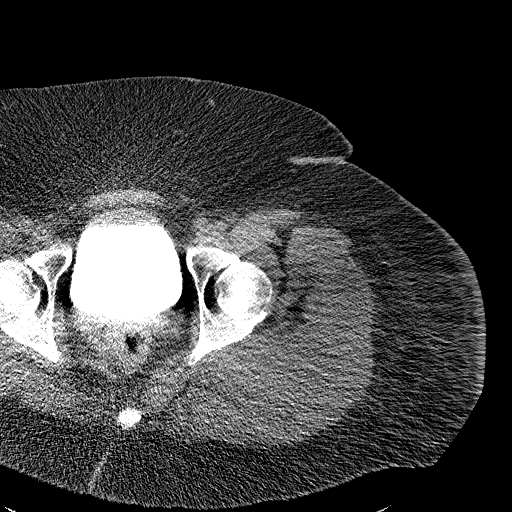
[im 491/737  soft-tissue]
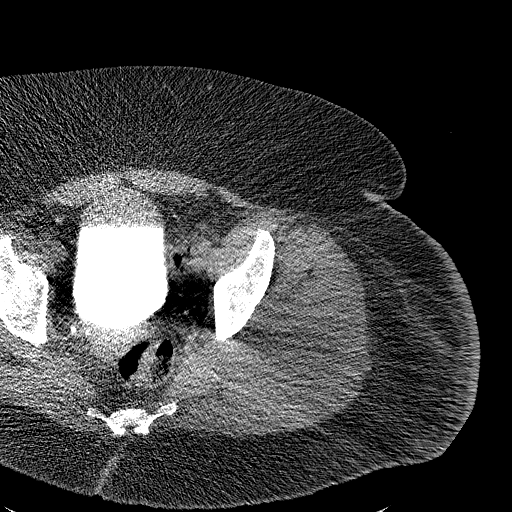
[im 553/737  soft-tissue]
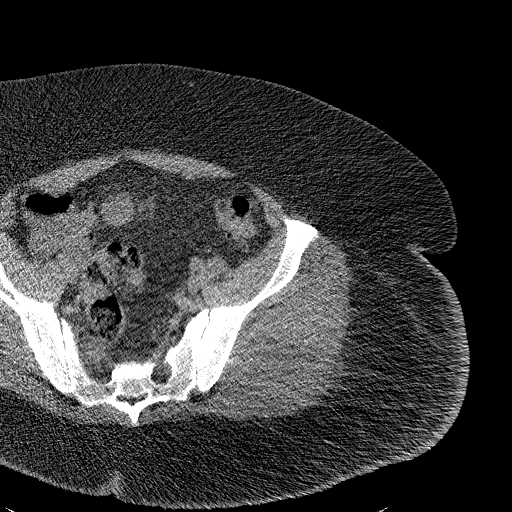
[im 553/737  bone]
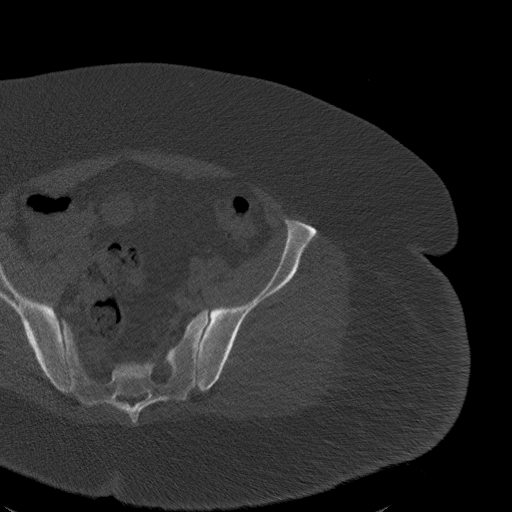
[im 614/737  soft-tissue]
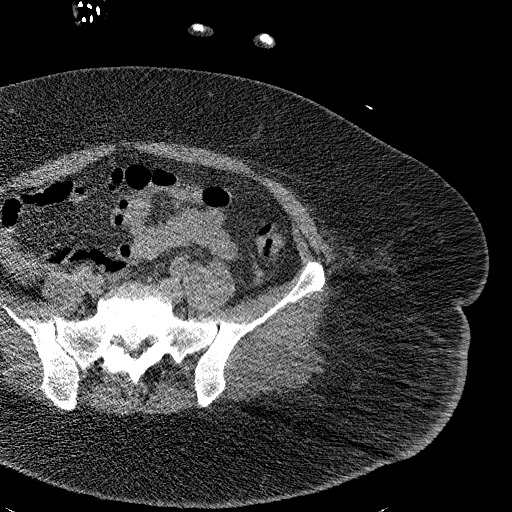
[im 675/737  soft-tissue]
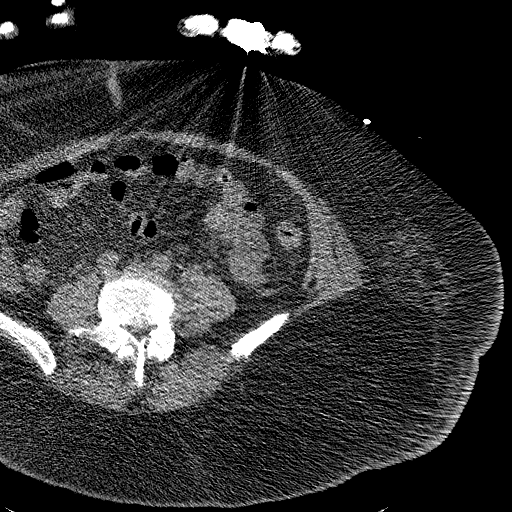

[Series 10: coronal st · coronal · 0.86mm/px · 3 of 201 slices shown]
[im 67/201  soft-tissue]
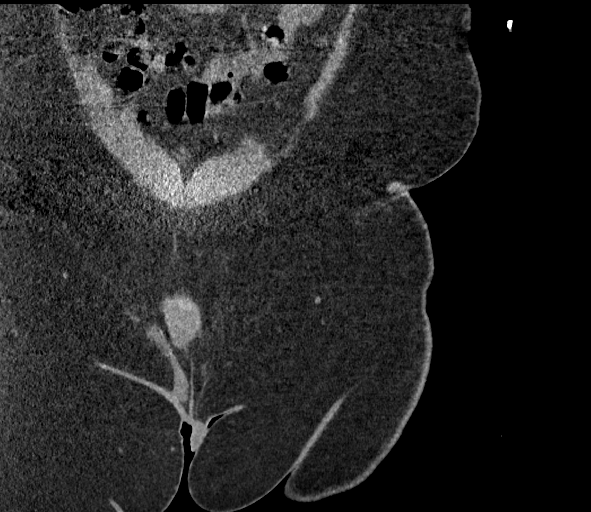
[im 89/201  soft-tissue]
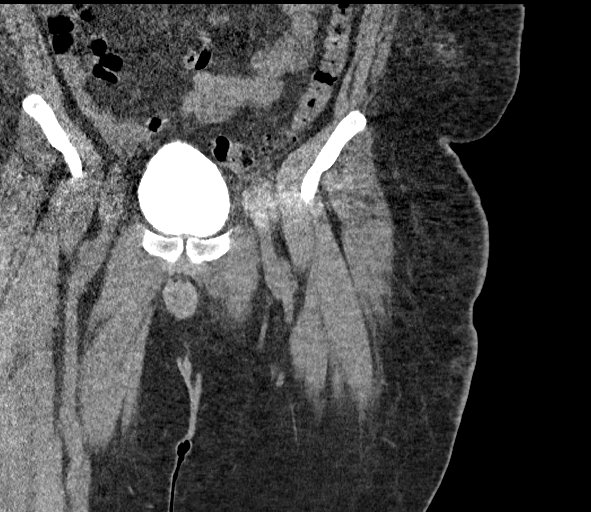
[im 112/201  soft-tissue]
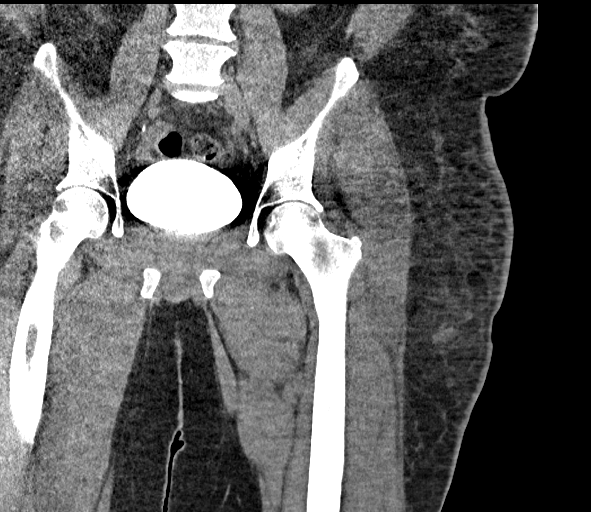

[14 of 46 positions shown; findings below may reference images not displayed]

FINDINGS: Bones/Joint/Cartilage

No fracture or dislocation. There is moderate bilateral hip
osteoarthritis with superior joint space loss and marginal
osteophyte formation. No large joint effusion is seen.

Ligaments

Suboptimally assessed by CT.

Muscles and Tendons

Within the gluteal musculature there is enlargement and
heterogeneous appearance to the gluteal musculature. No definite
loculated fluid collection is seen, however limited due to
technique.

Soft tissues

Overlying subcutaneous edema and soft tissue stranding which extends
over the greater trochanter.
IMPRESSION: Heterogeneous appearance with enlargement of the gluteal musculature
with surrounding soft tissue inflammatory changes and skin
thickening. No definite loculated fluid collection. This could be
due to resolving hematoma or myositis. If further evaluation is
required, would recommend MRI.

## 2019-06-10 IMAGING — CT CT ANGIO CHEST
2 of 6 series · 18 of 36 positions shown · IV contrast (omnipaque)
Comparison: None.

CLINICAL DATA: Chest pain.

EXAM:
CT ANGIOGRAPHY CHEST WITH CONTRAST
TECHNIQUE: Multidetector CT imaging of the chest was performed using the
standard protocol during bolus administration of intravenous
contrast. Multiplanar CT image reconstructions and MIPs were
obtained to evaluate the vascular anatomy.
CONTRAST:  80mL OMNIPAQUE IOHEXOL 350 MG/ML SOLN

[Series 7: pe thins · axial · 0.79mm/px · z∈[+1317,+1580]mm · 17 of 418 slices shown]
[im 21/418  lung]
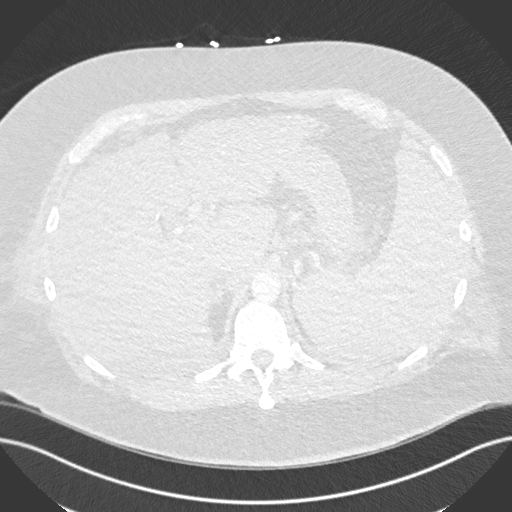
[im 42/418  mediastinal]
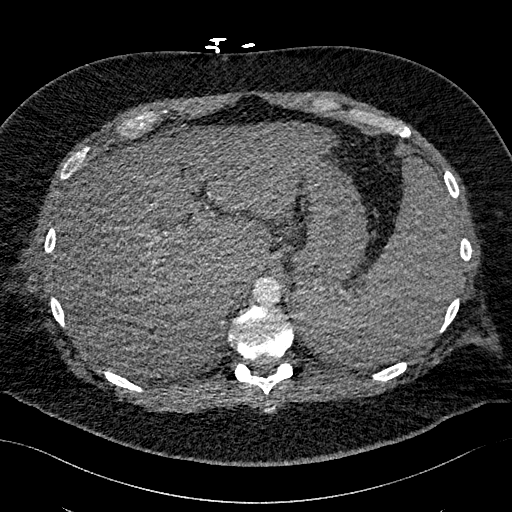
[im 63/418  lung]
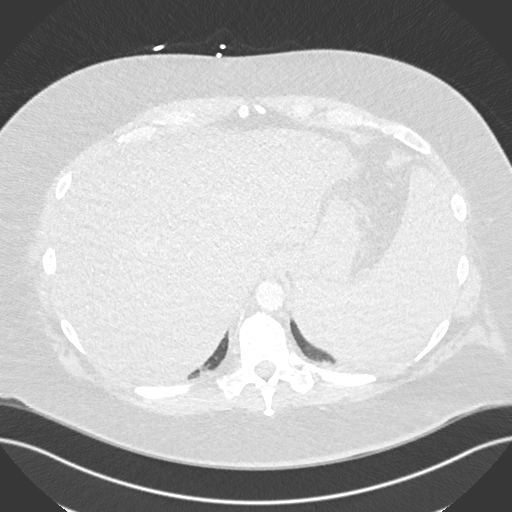
[im 84/418  mediastinal]
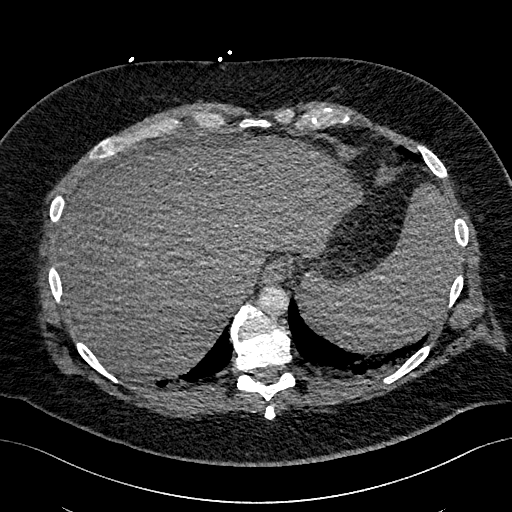
[im 126/418  lung]
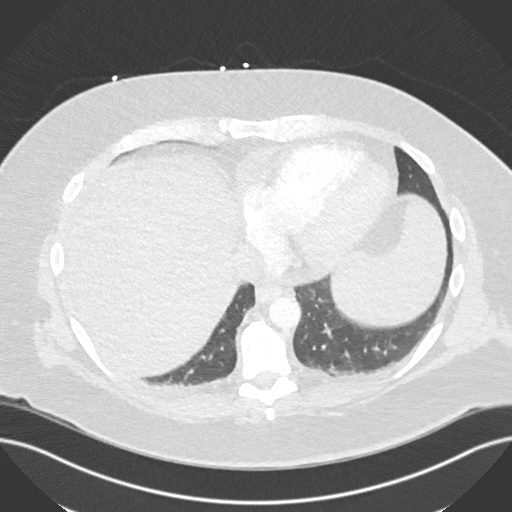
[im 146/418  mediastinal]
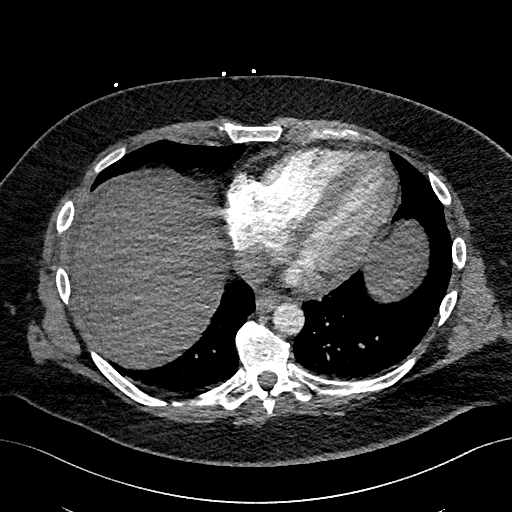
[im 167/418  lung]
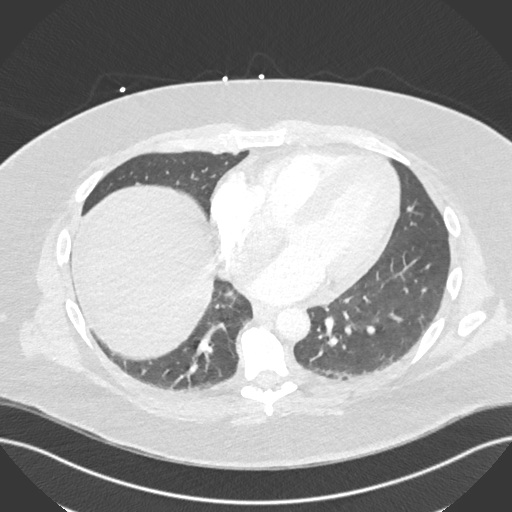
[im 188/418  mediastinal]
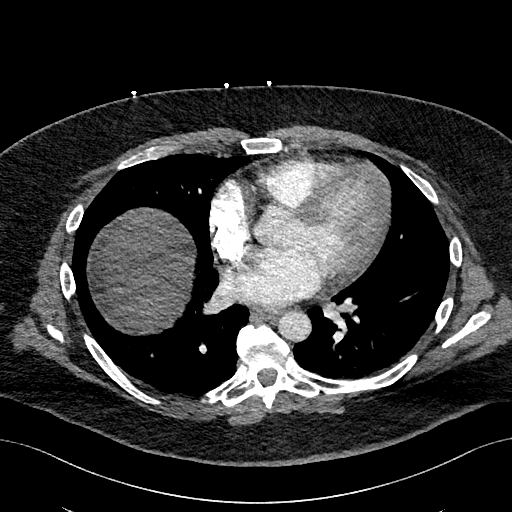
[im 209/418  lung]
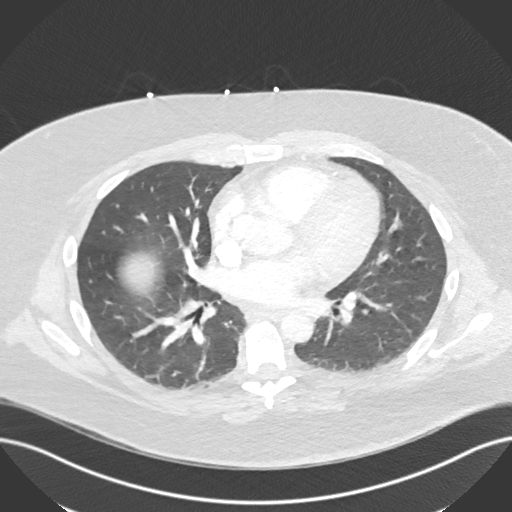
[im 230/418  mediastinal]
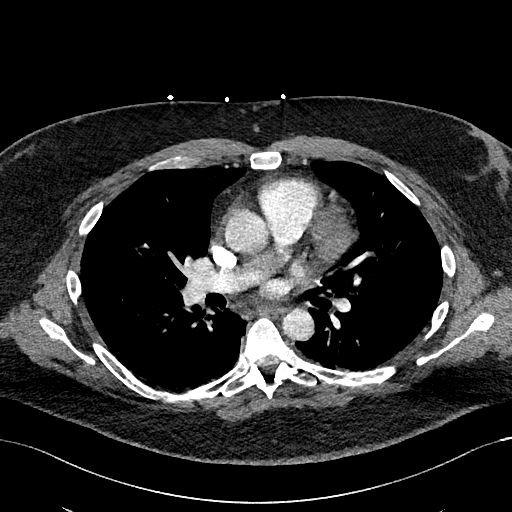
[im 251/418  lung]
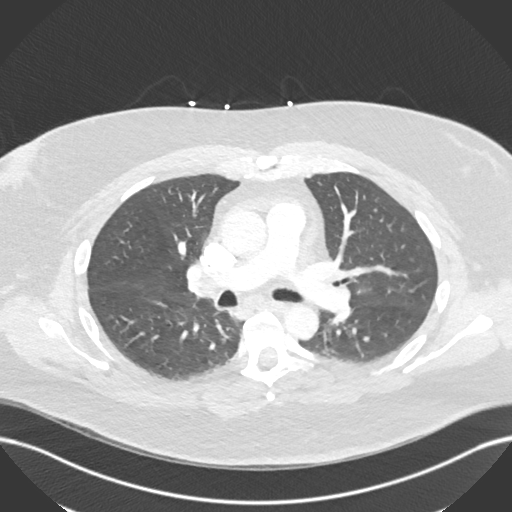
[im 272/418  mediastinal]
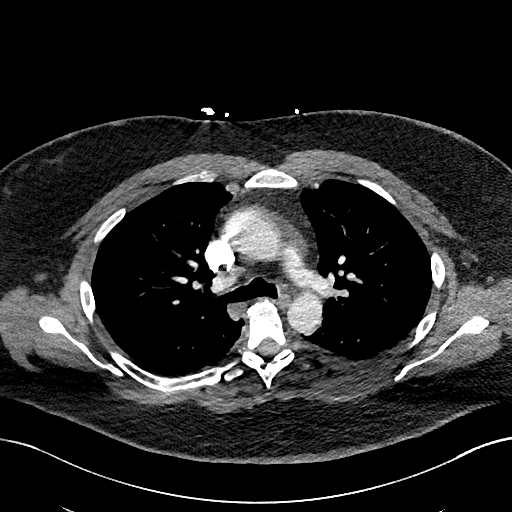
[im 292/418  lung]
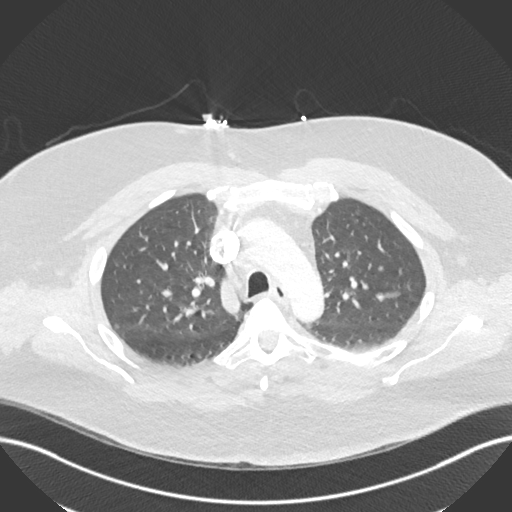
[im 334/418  mediastinal]
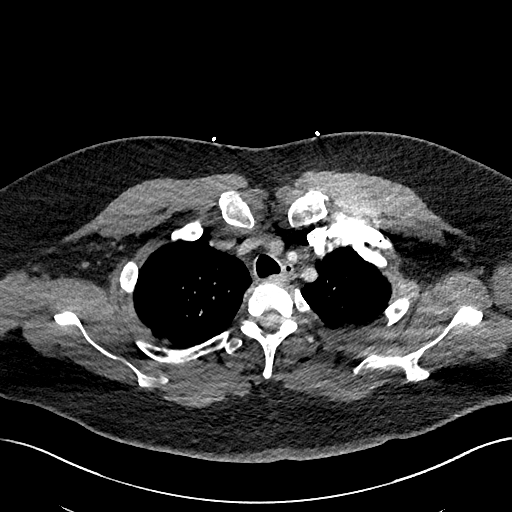
[im 355/418  lung]
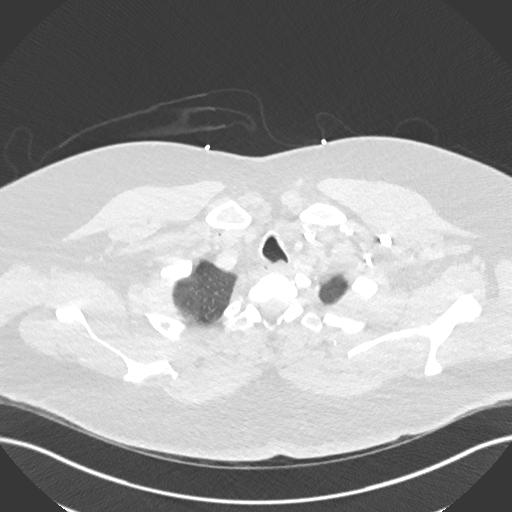
[im 376/418  mediastinal]
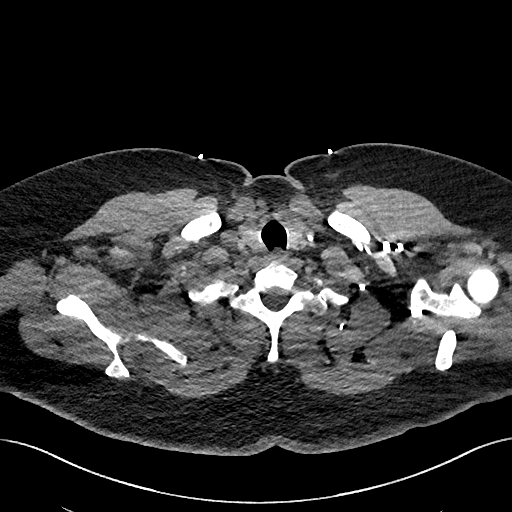
[im 397/418  lung]
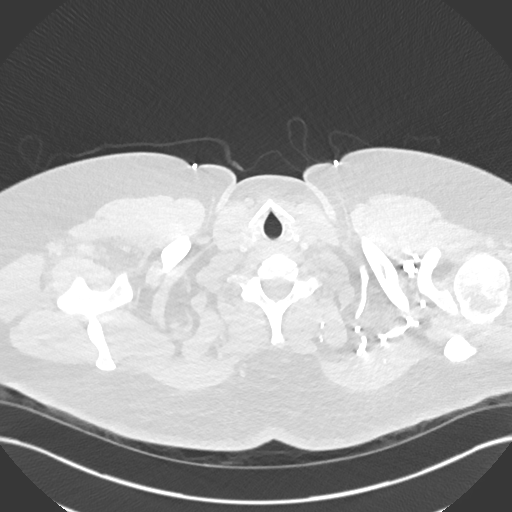

[Series 8: pe 2mm cor · coronal · 0.54mm/px · 1 of 142 slices shown]
[im 71/142  mediastinal]
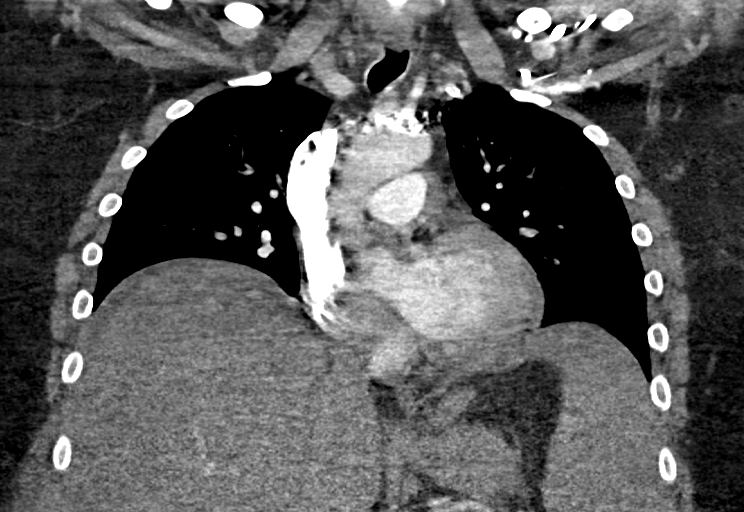

[18 of 36 positions shown; findings below may reference images not displayed]

FINDINGS: Cardiovascular: There does not appear to be any definite evidence of
large central pulmonary embolus in the main pulmonary artery or main
portions of the left or right pulmonary arteries. However, there is
limited opacification of the more peripheral branches, and therefore
smaller peripheral pulmonary emboli cannot be excluded on the basis
of this exam. There is no evidence of thoracic aortic dissection or
aneurysm. Normal cardiac size. No pericardial effusion.

Mediastinum/Nodes: No enlarged mediastinal, hilar, or axillary lymph
nodes. Thyroid gland, trachea, and esophagus demonstrate no
significant findings.

Lungs/Pleura: Lungs are clear. No pleural effusion or pneumothorax.

Upper Abdomen: Mild splenomegaly is noted. Possible nodularity of
liver is noted suggesting possible hepatic cirrhosis.

Musculoskeletal: No chest wall abnormality. No acute or significant
osseous findings.

Review of the MIP images confirms the above findings.
IMPRESSION: 1. There is no definite evidence of large central pulmonary embolus
in the main pulmonary artery or main portions of the left or right
pulmonary arteries. However, there is limited opacification of the
more peripheral branches, and therefore smaller peripheral pulmonary
emboli cannot be excluded on the basis of this exam.
2. Mild splenomegaly is noted. Possible nodularity of liver is noted
suggesting possible hepatic cirrhosis.

## 2019-06-10 MED ORDER — SODIUM CHLORIDE 0.9 % IV SOLN: INTRAVENOUS | Status: AC

## 2019-06-10 MED ORDER — INSULIN ASPART 100 UNIT/ML ~~LOC~~ SOLN
0.0000 [IU] | Freq: Every day | SUBCUTANEOUS | Status: DC
  Administered 2019-06-10 – 2019-06-15 (×4): 2 [IU] via SUBCUTANEOUS
  Administered 2019-06-25: 3 [IU] via SUBCUTANEOUS
  Administered 2019-06-26: 2 [IU] via SUBCUTANEOUS

## 2019-06-10 MED ORDER — INSULIN ASPART 100 UNIT/ML ~~LOC~~ SOLN
8.0000 [IU] | Freq: Three times a day (TID) | SUBCUTANEOUS | Status: DC
  Administered 2019-06-10 – 2019-06-30 (×39): 8 [IU] via SUBCUTANEOUS

## 2019-06-10 MED ORDER — IOHEXOL 350 MG/ML SOLN
80.0000 mL | Freq: Once | INTRAVENOUS | Status: AC | PRN
  Administered 2019-06-10: 80 mL via INTRAVENOUS

## 2019-06-10 MED ORDER — IOHEXOL 300 MG/ML  SOLN
100.0000 mL | Freq: Once | INTRAMUSCULAR | Status: AC | PRN
  Administered 2019-06-10: 100 mL via INTRAVENOUS

## 2019-06-10 MED ORDER — ASCORBIC ACID 500 MG PO TABS
1000.0000 mg | ORAL_TABLET | Freq: Every day | ORAL | Status: DC
  Administered 2019-06-10 – 2019-06-30 (×20): 1000 mg via ORAL
  Filled 2019-06-10 (×20): qty 2

## 2019-06-10 MED ORDER — INSULIN GLARGINE 100 UNIT/ML ~~LOC~~ SOLN
25.0000 [IU] | Freq: Every day | SUBCUTANEOUS | Status: DC
  Administered 2019-06-10 – 2019-06-21 (×12): 25 [IU] via SUBCUTANEOUS
  Filled 2019-06-10 (×13): qty 0.25

## 2019-06-10 MED ORDER — SODIUM CHLORIDE 0.9 % IV SOLN: 2.0000 g | Freq: Three times a day (TID) | INTRAVENOUS | Status: DC

## 2019-06-10 MED ORDER — SODIUM CHLORIDE 0.9 % IV BOLUS
1000.0000 mL | Freq: Once | INTRAVENOUS | Status: AC
  Administered 2019-06-10: 1000 mL via INTRAVENOUS

## 2019-06-10 MED ORDER — INSULIN ASPART 100 UNIT/ML ~~LOC~~ SOLN
0.0000 [IU] | Freq: Three times a day (TID) | SUBCUTANEOUS | Status: DC
  Administered 2019-06-10: 8 [IU] via SUBCUTANEOUS
  Administered 2019-06-11 – 2019-06-12 (×6): 3 [IU] via SUBCUTANEOUS
  Administered 2019-06-13: 2 [IU] via SUBCUTANEOUS
  Administered 2019-06-13 – 2019-06-15 (×5): 3 [IU] via SUBCUTANEOUS
  Administered 2019-06-15: 2 [IU] via SUBCUTANEOUS
  Administered 2019-06-15 – 2019-06-17 (×4): 3 [IU] via SUBCUTANEOUS
  Administered 2019-06-17 – 2019-06-18 (×3): 2 [IU] via SUBCUTANEOUS
  Administered 2019-06-18 – 2019-06-19 (×3): 3 [IU] via SUBCUTANEOUS
  Administered 2019-06-19: 2 [IU] via SUBCUTANEOUS
  Administered 2019-06-20: 3 [IU] via SUBCUTANEOUS
  Administered 2019-06-20 (×2): 2 [IU] via SUBCUTANEOUS
  Administered 2019-06-21 (×2): 3 [IU] via SUBCUTANEOUS
  Administered 2019-06-22 (×2): 2 [IU] via SUBCUTANEOUS
  Administered 2019-06-22: 3 [IU] via SUBCUTANEOUS
  Administered 2019-06-23 (×2): 2 [IU] via SUBCUTANEOUS
  Administered 2019-06-24 – 2019-06-25 (×4): 3 [IU] via SUBCUTANEOUS
  Administered 2019-06-25: 2 [IU] via SUBCUTANEOUS
  Administered 2019-06-26: 8 [IU] via SUBCUTANEOUS
  Administered 2019-06-26: 5 [IU] via SUBCUTANEOUS
  Administered 2019-06-26: 8 [IU] via SUBCUTANEOUS
  Administered 2019-06-27: 2 [IU] via SUBCUTANEOUS
  Administered 2019-06-27: 4 [IU] via SUBCUTANEOUS
  Administered 2019-06-27 – 2019-06-28 (×4): 3 [IU] via SUBCUTANEOUS
  Administered 2019-06-29: 5 [IU] via SUBCUTANEOUS
  Administered 2019-06-29: 3 [IU] via SUBCUTANEOUS
  Administered 2019-06-30 (×2): 2 [IU] via SUBCUTANEOUS

## 2019-06-10 MED ORDER — VANCOMYCIN HCL 10 G IV SOLR
2500.0000 mg | Freq: Once | INTRAVENOUS | Status: AC
  Administered 2019-06-10: 2500 mg via INTRAVENOUS
  Filled 2019-06-10: qty 2500

## 2019-06-10 MED ORDER — VITAMIN D 25 MCG (1000 UNIT) PO TABS
2000.0000 [IU] | ORAL_TABLET | Freq: Every day | ORAL | Status: DC
  Administered 2019-06-11 – 2019-06-30 (×19): 2000 [IU] via ORAL
  Filled 2019-06-10 (×19): qty 2

## 2019-06-10 MED ORDER — SODIUM CHLORIDE 0.9 % IV SOLN
2.0000 g | Freq: Once | INTRAVENOUS | Status: AC
  Administered 2019-06-10: 2 g via INTRAVENOUS
  Filled 2019-06-10: qty 2

## 2019-06-10 MED ORDER — VANCOMYCIN HCL IN DEXTROSE 1-5 GM/200ML-% IV SOLN: 1000.0000 mg | Freq: Once | INTRAVENOUS | Status: DC

## 2019-06-10 MED ORDER — HEPARIN SODIUM (PORCINE) 5000 UNIT/ML IJ SOLN
5000.0000 [IU] | Freq: Three times a day (TID) | INTRAMUSCULAR | Status: AC
  Administered 2019-06-10 – 2019-06-16 (×19): 5000 [IU] via SUBCUTANEOUS
  Filled 2019-06-10 (×19): qty 1

## 2019-06-10 MED ORDER — SODIUM CHLORIDE 0.9 % IV SOLN
1.0000 g | INTRAVENOUS | Status: DC
  Administered 2019-06-10: 1 g via INTRAVENOUS
  Filled 2019-06-10 (×2): qty 10

## 2019-06-10 MED ORDER — HYDROCODONE-ACETAMINOPHEN 5-325 MG PO TABS
1.0000 | ORAL_TABLET | Freq: Once | ORAL | Status: AC
  Administered 2019-06-10: 1 via ORAL
  Filled 2019-06-10: qty 1

## 2019-06-10 MED ORDER — ONDANSETRON HCL 4 MG/2ML IJ SOLN
4.0000 mg | Freq: Once | INTRAMUSCULAR | Status: AC
  Administered 2019-06-10: 4 mg via INTRAVENOUS
  Filled 2019-06-10: qty 2

## 2019-06-10 MED ORDER — SODIUM CHLORIDE 0.9 % IV SOLN
INTRAVENOUS | Status: AC
  Administered 2019-06-10: 18:00:00 via INTRAVENOUS

## 2019-06-10 MED ORDER — VANCOMYCIN HCL 10 G IV SOLR
1750.0000 mg | Freq: Two times a day (BID) | INTRAVENOUS | Status: DC
  Administered 2019-06-11 (×2): 1750 mg via INTRAVENOUS
  Filled 2019-06-10 (×4): qty 1750

## 2019-06-10 MED ORDER — MORPHINE SULFATE (PF) 4 MG/ML IV SOLN
4.0000 mg | Freq: Once | INTRAVENOUS | Status: AC
  Administered 2019-06-10: 4 mg via INTRAVENOUS
  Filled 2019-06-10: qty 1

## 2019-06-10 NOTE — H&P (Addendum)
[facility] History and Physical  Willam Munford UQJ:[id number] DOB: 1965-09-20 DOA: 06/10/2019  Referring physician: EDP PCP: Patient, No Pcp Per   Chief Complaint: Fever and chills  HPI: Robert Sullivan is a 53 y.o. male with history of uncontrolled type 2 diabetes mellitus, morbid obesity was admitted to [facility] from 11/2-11/7 with E. coli UTI, uncontrolled diabetes and a fall that resulted in a left gluteal hematoma, he was treated with IV antibiotics, discharged home on Bactrim, completed course and also on insulin. -He felt well initially after discharge, in the last 7 to 10 days he started having fevers and chills, profuse sweats, 5 to 6 days ago he started having increased pain and swelling at the site of his gluteal hematoma, he also noticed that his urine was darker but denied any dysuria or change in odor presented to the emergency room today with weakness, worsening left hip pain, fevers chills and sweats. -In the ED he was noted to have a sodium of 131, CBG of 380, albumin of 1.8, ALP of 224,  lactic acid of 5.5, white count of 20,000.  In the ED he was noted to have an elevated D-dimer subsequently EDP proceeded with CT angiogram which was negative for pulmonary embolism, his UA was significantly abnormal with cloudy urine, positive nitrite, leukocyte esterase, numerous WBC and bacteria.  In addition noted to have redness and tenderness of his left gluteal region   Review of Systems: 14 systems reviewed and all negative except per HPI  Past Medical History:  Diagnosis Date  . Arthritis 05/13/2019   knees  . Diabetes mellitus without complication ([place])   . Headache   . Sleep apnea    History reviewed. No pertinent surgical history. Social History:  reports that he has never smoked. He has never used smokeless tobacco.  Allergies  Allergen Reactions  . Sulfa Antibiotics     Total body rash    Family history -Significant for diabetes mellitus  Prior to Admission medications    Medication Sig Start Date End Date Taking? Authorizing Provider  cholecalciferol (VITAMIN D3) 25 MCG (1000 UT) tablet Take 2,000 Units by mouth daily.   Yes [provider]  insulin aspart (NOVOLOG) 100 UNIT/ML injection Inject 9 Units into the skin 3 (three) times daily with meals. Patient taking differently: Inject 13 Units into the skin 2 (two) times daily.  05/16/19  Yes Eulogio Bear U, DO  vitamin C (ASCORBIC ACID) 500 MG tablet Take 1,000 mg by mouth daily.   Yes [provider]  insulin glargine (LANTUS) 100 UNIT/ML injection Inject 0.24 mLs (24 Units total) into the skin daily. Patient not taking: Reported on 06/10/2019 05/17/19   Geradine Girt, DO   Physical Exam: Vitals:   06/10/19 1445 06/10/19 1500 06/10/19 1530 06/10/19 1630  BP:  108/62 111/63 (!) 119/58  Pulse: 89 88 92 92  Resp: 17 16 18 17   Temp:      TempSrc:      SpO2: 95%  96% 96%  Weight:      Height:        Wt Readings from Last 3 Encounters:  06/10/19 (!) 172.4 kg  05/17/19 (!) 172.8 kg  05/12/19 (!) 163.3 kg   Gen: Morbidly obese male sitting up in bed, AAO x3, no distress = HEENT: PERRLA, Neck supple, no JVD Lungs: Decreased breath sounds the bases otherwise clear CVS: S1-S2, regular rhythm, tachycardic Abd: Soft obese nontender nondistended bowel sounds present Extremities: Left hip, gluteal region with large  area of erythema tenderness and induration  skin: As above, healing skin rash  Labs on Admission:  Basic Metabolic Panel: Recent Labs  Lab 06/10/19 1129  NA 131*  K 4.8  CL 95*  CO2 14*  GLUCOSE 380*  BUN 16  CREATININE 1.09  CALCIUM 8.6*   Liver Function Tests: Recent Labs  Lab 06/10/19 1129  AST 72*  ALT 44  ALKPHOS 224*  BILITOT 1.7*  PROT 7.4  ALBUMIN 1.8*   No results for input(s): LIPASE, AMYLASE in the last 168 hours. No results for input(s): AMMONIA in the last 168 hours. CBC: Recent Labs  Lab 06/10/19 1129  WBC 20.4*  NEUTROABS 17.6*  HGB  13.6  HCT 41.0  MCV 92.3  PLT 198   Cardiac Enzymes: Recent Labs  Lab 06/10/19 1129  CKTOTAL 23*    BNP (last 3 results) No results for input(s): BNP in the last 8760 hours.  ProBNP (last 3 results) No results for input(s): PROBNP in the last 8760 hours.  CBG: No results for input(s): GLUCAP in the last 168 hours.  Radiological Exams on Admission: Ct Angio Chest Pe W And/or Wo Contrast  Result Date: 06/10/2019 CLINICAL DATA:  Chest pain. EXAM: CT ANGIOGRAPHY CHEST WITH CONTRAST TECHNIQUE: Multidetector CT imaging of the chest was performed using the standard protocol during bolus administration of intravenous contrast. Multiplanar CT image reconstructions and MIPs were obtained to evaluate the vascular anatomy. CONTRAST:  42m OMNIPAQUE IOHEXOL 350 MG/ML SOLN COMPARISON:  None. FINDINGS: Cardiovascular: There does not appear to be any definite evidence of large central pulmonary embolus in the main pulmonary artery or main portions of the left or right pulmonary arteries. However, there is limited opacification of the more peripheral branches, and therefore smaller peripheral pulmonary emboli cannot be excluded on the basis of this exam. There is no evidence of thoracic aortic dissection or aneurysm. Normal cardiac size. No pericardial effusion. Mediastinum/Nodes: No enlarged mediastinal, hilar, or axillary lymph nodes. Thyroid gland, trachea, and esophagus demonstrate no significant findings. Lungs/Pleura: Lungs are clear. No pleural effusion or pneumothorax. Upper Abdomen: Mild splenomegaly is noted. Possible nodularity of liver is noted suggesting possible hepatic cirrhosis. Musculoskeletal: No chest wall abnormality. No acute or significant osseous findings. Review of the MIP images confirms the above findings. IMPRESSION: 1. There is no definite evidence of large central pulmonary embolus in the main pulmonary artery or main portions of the left or right pulmonary arteries. However,  there is limited opacification of the more peripheral branches, and therefore smaller peripheral pulmonary emboli cannot be excluded on the basis of this exam. 2. Mild splenomegaly is noted. Possible nodularity of liver is noted suggesting possible hepatic cirrhosis. Electronically Signed   By: JMarijo ConceptionM.D.   On: 06/10/2019 16:19      Assessment/Plan  Sepsis -This could be secondary to UTI and left gluteal cellulitis versus abscess -Had pansensitive E. coli on urine culture 3 weeks ago, start IV ceftriaxone, follow-up urine culture again, monitor for urinary retention -Also needs a CT of his left gluteal region to evaluate for abscess, he had a hematoma in his left gluteus maximus muscle from a fall 3 weeks ago, noted on recent hospitalization -Add IV antibiotics to treat this cellulitis versus abscess -If CT indeed shows fluid collection will likely need I&D -Follow-up blood cultures, gentle IV fluids, lactic acid level is improving  Urinary tract infection -As above, monitor for retention  Left gluteal hematoma with cellulitis, possible abscess -As above, add IV  vancomycin, follow-up CT, will need Ortho or general surgery consult if this is concerning for fluid collection  Morbid obesity -BMI of 47, needs weight loss, lifestyle modification  Uncontrolled type 2 diabetes mellitus -Hemoglobin A1c was 11 recently -Resume Lantus and NovoLog premeal and sliding scale insulin  Suspected cirrhosis -Noted on imaging, patient denies any alcohol use currently, suspect NASH/fatty liver disease is likely the primary culprit -Recommend gastroenterology follow-up -Check hep C serology  Code Status: Full code DVT Prophylaxis: Heparin subcutaneous Family Communication: No family at bedside, discussed patient in detail  disposition Plan: Home pending clinical improvement  Admission status: Inpatient   Time spent: 69mn  Noor Witte [facility]

## 2019-06-10 NOTE — ED Notes (Signed)
Patient transported to CT 

## 2019-06-10 NOTE — ED Notes (Signed)
2+ left pedal pulse, cap refill less than 3 sec, pt's left outer thigh is erythematous and hot to the touch, pt has diminished sensation in feet due to neuropathy. Pt able to move left foot

## 2019-06-10 NOTE — ED Notes (Signed)
Pt reports 10/10 after returning from CT, requests pain medication, on-call MD paged, orders received (see MAR)

## 2019-06-10 NOTE — Progress Notes (Signed)
Pharmacy Antibiotic Note  Robert Sullivan is a 53 y.o. male admitted on 06/10/2019 with sepsis.  Pharmacy has been consulted for vancomycin and cefepime dosing. Pt is afebrile but WBC is elevated at 20.4. SCr is WNL at 1.09 and lactic acid is elevated.   Plan: Vancomycin 2538m IV x 1 then 17559mIV Q12H  Cefepime 2gm IV Q8H F/u renal fxn, C&S, clinical status and peak/trough at SS  Height: 6' 3"  (190.5 cm) Weight: (!) 380 lb (172.4 kg) IBW/kg (Calculated) : 84.5  Temp (24hrs), Avg:98.5 F (36.9 C), Min:98.1 F (36.7 C), Max:98.9 F (37.2 C)  Recent Labs  Lab 06/10/19 1129  WBC 20.4*  CREATININE 1.09  LATICACIDVEN 5.5*    Estimated Creatinine Clearance: 134.2 mL/min (by C-G formula based on SCr of 1.09 mg/dL).    Allergies  Allergen Reactions  . Sulfa Antibiotics     Total body rash    Antimicrobials this admission: Vanc 12/1>> Cefepime 12/1>>  Dose adjustments this admission: N/A  Microbiology results: Pending  Thank you for allowing pharmacy to be a part of this patient's care.  Saurabh Hettich, RaRande Lawman2/07/2018 12:58 PM

## 2019-06-10 NOTE — ED Notes (Signed)
Ordered dinner tray.  

## 2019-06-10 NOTE — ED Triage Notes (Signed)
Patient c/o chills, sweating, dark urine onset of 11/12. Patient states he was in the hospital for a while after injuring himself last month. Patient states he had a rash after taking the medications he was given but has completed them all. States rash is gone. Also c/o left hip pain with swelling.

## 2019-06-10 NOTE — ED Provider Notes (Signed)
[facility] EMERGENCY DEPARTMENT Provider Note   CSN: [id number] Arrival date & time: 06/10/19  1038     History   Chief Complaint No chief complaint on file.   HPI Robert Sullivan is a 53 y.o. male with PMH/o DM, recent sepsis secondary to urinary source who presents for evaluation of dark urine, generalized weakness, worsening pain, swelling noted to left lower leg, shortness of breath that has been ongoing for last couple days.  Patient was recently admitted to the hospital on 05/12/2019 after being found septic from urinary source.  He also had a fall and had a tear in his gluteus muscle which resulted in hematoma.  He was discharged home on Bactrim on 05/17/2019 which he states he completed.  He did have a mild rash associated with Bactrim but states that he finished the entire course of antibiotics.  He had been getting better up into the last few days when he started having worsening pain to his left thigh.  He states that it started getting more swollen, hot to touch.  He states that the pain radiates down his left lower extremity.  He also noted that over the last couple days, his urine has become more dark.  He states that is been more of a tea color.  He has not had any dysuria.  He states that when he had a previous urinary tract infection, he did not have any dysuria.  Patient states that he has noted some subjective fevers but has not measured his temperature at home.  He has not had any nausea/vomiting/abdominal pain but has had some generalized weakness, fatigue.  He does report that he has had some diarrhea but has not noted any blood in the stools.  He denies any chest pain.  He is not currently on any blood thinners.    The history is provided by the patient.    Past Medical History:  Diagnosis Date   Arthritis 05/13/2019   knees   Diabetes mellitus without complication ([place])    Headache    Sleep apnea     Patient Active Problem List   Diagnosis Date  Noted   Uncontrolled diabetes mellitus ([place]) 06/10/2019   Obesity, Class III, BMI 40-49.9 (morbid obesity) ([place]) 06/10/2019   Cellulitis, gluteal, left 06/10/2019   Sepsis secondary to UTI ([place]) 05/13/2019   Acute lower UTI 05/13/2019   Type 2 diabetes mellitus without complication ([place]) 35/46/5681   Hip pain, acute, left 05/13/2019   Sepsis ([place]) 05/13/2019   Acute cystitis without hematuria    Hyperglycemia     History reviewed. No pertinent surgical history.      Home Medications    Prior to Admission medications   Medication Sig Start Date End Date Taking? Authorizing Provider  cholecalciferol (VITAMIN D3) 25 MCG (1000 UT) tablet Take 2,000 Units by mouth daily.   Yes [provider]  insulin aspart (NOVOLOG) 100 UNIT/ML injection Inject 9 Units into the skin 3 (three) times daily with meals. Patient taking differently: Inject 13 Units into the skin 2 (two) times daily.  05/16/19  Yes Eulogio Bear U, DO  vitamin C (ASCORBIC ACID) 500 MG tablet Take 1,000 mg by mouth daily.   Yes [provider]  insulin glargine (LANTUS) 100 UNIT/ML injection Inject 0.24 mLs (24 Units total) into the skin daily. Patient not taking: Reported on 06/10/2019 05/17/19   Geradine Girt, DO    Family History No family history on file.  Social History Social  History   Tobacco Use   Smoking status: Never Smoker   Smokeless tobacco: Never Used  Substance Use Topics   Alcohol use: Not Currently   Drug use: Not Currently     Allergies   Sulfa antibiotics   Review of Systems Review of Systems  Constitutional: Positive for fatigue and fever.  Respiratory: Positive for shortness of breath. Negative for cough.   Cardiovascular: Negative for chest pain.  Gastrointestinal: Negative for abdominal pain, nausea and vomiting.  Genitourinary: Negative for dysuria and hematuria.  Neurological: Positive for weakness (generalized). Negative for headaches.  All other  systems reviewed and are negative.    Physical Exam Updated Vital Signs BP (!) 119/58    Pulse 92    Temp 98.9 F (37.2 C) (Rectal)    Resp 17    Ht 6' 3"  (1.905 m)    Wt (!) 172.4 kg    SpO2 96%    BMI 47.50 kg/m   Physical Exam Vitals signs and nursing note reviewed. Exam conducted with a chaperone present.  Constitutional:      Appearance: Normal appearance. He is well-developed.  HENT:     Head: Normocephalic and atraumatic.  Eyes:     General: Lids are normal.     Conjunctiva/sclera: Conjunctivae normal.     Pupils: Pupils are equal, round, and reactive to light.  Neck:     Musculoskeletal: Full passive range of motion without pain.  Cardiovascular:     Rate and Rhythm: Normal rate and regular rhythm.     Pulses: Normal pulses.          Radial pulses are 2+ on the right side and 2+ on the left side.       Dorsalis pedis pulses are 2+ on the right side and 2+ on the left side.     Heart sounds: Normal heart sounds. No murmur. No friction rub. No gallop.   Pulmonary:     Effort: Pulmonary effort is normal.     Breath sounds: Normal breath sounds.     Comments: Lungs clear to auscultation bilaterally.  Symmetric chest rise.  No wheezing, rales, rhonchi. Abdominal:     Palpations: Abdomen is soft. Abdomen is not rigid.     Tenderness: There is no abdominal tenderness. There is no guarding.     Comments: Abdomen is soft, non-distended, non-tender. No rigidity, No guarding. No peritoneal signs.  Genitourinary:    Scrotum/Testes: Normal.        Right: Tenderness or swelling not present.        Left: Tenderness or swelling not present.     Comments: The exam was performed with a chaperone present. Normal male genitalia. No evidence of rash, ulcers or lesions.  Musculoskeletal: Normal range of motion.       Legs:     Comments: Warmth, induration, tenderness noted to the lateral aspect and posterior aspect of the left thigh that extends up into the left gluteus.  He does have  some overlying erythema.  No palpable deformity or crepitus noted.  Limited range of motion secondary to pain.  Skin:    General: Skin is warm and dry.     Capillary Refill: Capillary refill takes less than 2 seconds.  Neurological:     Mental Status: He is alert and oriented to person, place, and time.  Psychiatric:        Speech: Speech normal.      ED Treatments / Results  Labs (all labs ordered are  listed, but only abnormal results are displayed) Labs Reviewed  COMPREHENSIVE METABOLIC PANEL - Abnormal; Notable for the following components:      Result Value   Sodium 131 (*)    Chloride 95 (*)    CO2 14 (*)    Glucose, Bld 380 (*)    Calcium 8.6 (*)    Albumin 1.8 (*)    AST 72 (*)    Alkaline Phosphatase 224 (*)    Total Bilirubin 1.7 (*)    Anion gap 22 (*)    All other components within normal limits  CBC WITH DIFFERENTIAL/PLATELET - Abnormal; Notable for the following components:   WBC 20.4 (*)    Neutro Abs 17.6 (*)    Monocytes Absolute 1.2 (*)    Abs Immature Granulocytes 0.34 (*)    All other components within normal limits  URINALYSIS, ROUTINE W REFLEX MICROSCOPIC - Abnormal; Notable for the following components:   Color, Urine AMBER (*)    APPearance CLOUDY (*)    Glucose, UA 50 (*)    Hgb urine dipstick LARGE (*)    Bilirubin Urine SMALL (*)    Protein, ur 100 (*)    Nitrite POSITIVE (*)    Leukocytes,Ua MODERATE (*)    WBC, UA >50 (*)    Bacteria, UA FEW (*)    All other components within normal limits  LACTIC ACID, PLASMA - Abnormal; Notable for the following components:   Lactic Acid, Venous 5.5 (*)    All other components within normal limits  LACTIC ACID, PLASMA - Abnormal; Notable for the following components:   Lactic Acid, Venous 2.6 (*)    All other components within normal limits  CK - Abnormal; Notable for the following components:   Total CK 23 (*)    All other components within normal limits  D-DIMER, QUANTITATIVE (NOT AT [facility]) -  Abnormal; Notable for the following components:   D-Dimer, Quant 3.04 (*)    All other components within normal limits  PROTIME-INR - Abnormal; Notable for the following components:   Prothrombin Time 17.4 (*)    INR 1.4 (*)    All other components within normal limits  POC OCCULT BLOOD, ED - Abnormal; Notable for the following components:   Fecal Occult Bld POSITIVE (*)    All other components within normal limits  CULTURE, BLOOD (ROUTINE X 2)  CULTURE, BLOOD (ROUTINE X 2)  URINE CULTURE  APTT  POC SARS CORONAVIRUS 2 AG -  ED    EKG None  Radiology Ct Angio Chest Pe W And/or Wo Contrast  Result Date: 06/10/2019 CLINICAL DATA:  Chest pain. EXAM: CT ANGIOGRAPHY CHEST WITH CONTRAST TECHNIQUE: Multidetector CT imaging of the chest was performed using the standard protocol during bolus administration of intravenous contrast. Multiplanar CT image reconstructions and MIPs were obtained to evaluate the vascular anatomy. CONTRAST:  79m OMNIPAQUE IOHEXOL 350 MG/ML SOLN COMPARISON:  None. FINDINGS: Cardiovascular: There does not appear to be any definite evidence of large central pulmonary embolus in the main pulmonary artery or main portions of the left or right pulmonary arteries. However, there is limited opacification of the more peripheral branches, and therefore smaller peripheral pulmonary emboli cannot be excluded on the basis of this exam. There is no evidence of thoracic aortic dissection or aneurysm. Normal cardiac size. No pericardial effusion. Mediastinum/Nodes: No enlarged mediastinal, hilar, or axillary lymph nodes. Thyroid gland, trachea, and esophagus demonstrate no significant findings. Lungs/Pleura: Lungs are clear. No pleural effusion or pneumothorax. Upper Abdomen:  Mild splenomegaly is noted. Possible nodularity of liver is noted suggesting possible hepatic cirrhosis. Musculoskeletal: No chest wall abnormality. No acute or significant osseous findings. Review of the MIP images  confirms the above findings. IMPRESSION: 1. There is no definite evidence of large central pulmonary embolus in the main pulmonary artery or main portions of the left or right pulmonary arteries. However, there is limited opacification of the more peripheral branches, and therefore smaller peripheral pulmonary emboli cannot be excluded on the basis of this exam. 2. Mild splenomegaly is noted. Possible nodularity of liver is noted suggesting possible hepatic cirrhosis. Electronically Signed   By: Marijo Conception M.D.   On: 06/10/2019 16:19    Procedures .Critical Care Performed by: Volanda Napoleon, PA-C Authorized by: Volanda Napoleon, PA-C   Critical care provider statement:    Critical care time (minutes):  35   Critical care was necessary to treat or prevent imminent or life-threatening deterioration of the following conditions:  Sepsis   Critical care was time spent personally by me on the following activities:  Discussions with consultants, evaluation of patient's response to treatment, examination of patient, ordering and performing treatments and interventions, ordering and review of laboratory studies, ordering and review of radiographic studies, pulse oximetry, re-evaluation of patient's condition, obtaining history from patient or surrogate and review of old charts   (including critical care time)  Medications Ordered in ED Medications  vancomycin (VANCOCIN) 1,750 mg in sodium chloride 0.9 % 500 mL IVPB (has no administration in time range)  ceFEPIme (MAXIPIME) 2 g in sodium chloride 0.9 % 100 mL IVPB (has no administration in time range)  sodium chloride 0.9 % bolus 1,000 mL (0 mLs Intravenous Stopped 06/10/19 1342)  ceFEPIme (MAXIPIME) 2 g in sodium chloride 0.9 % 100 mL IVPB (0 g Intravenous Stopped 06/10/19 1411)  vancomycin (VANCOCIN) 2,500 mg in sodium chloride 0.9 % 500 mL IVPB (0 mg Intravenous Stopped 06/10/19 1635)  morphine 4 MG/ML injection 4 mg (4 mg Intravenous Given  06/10/19 1358)  ondansetron (ZOFRAN) injection 4 mg (4 mg Intravenous Given 06/10/19 1358)  sodium chloride 0.9 % bolus 1,000 mL (0 mLs Intravenous Stopped 06/10/19 1700)  iohexol (OMNIPAQUE) 350 MG/ML injection 80 mL (80 mLs Intravenous Contrast Given 06/10/19 1605)     Initial Impression / Assessment and Plan / ED Course  I have reviewed the triage vital signs and the nursing notes.  Pertinent labs & imaging results that were available during my care of the patient were reviewed by me and considered in my medical decision making (see chart for details).        53 year old male who was recently here for sepsis secondary to UTI but also had a gluteus maximus hematoma who presents for evaluation of dark urine, generalized weakness, fatigue, subjective fevers as well as worsening pain, swelling to left thigh/leg.  Was discharged home on Bactrim which he states he completed but started noticing some dark urine over the last couple days.  Subjective fevers at home.  Also reports worsening pain, swelling noted to left lower leg.  On initial arrival, he is afebrile but is tachycardic.  Vitals otherwise stable.  On exam, he does have notable warmth, erythema, induration noted to the lateral and posterior aspect of the left thigh.  Concern for hematoma versus cellulitis.  This does not extend distally.  Doubt DVT.  Also concern for traumatic rhabdo given history of symptoms.  Plan to check labs, urine.  CBC shows leukocytosis of 20.4.  BMP shows BUN of 16, creatinine 1.09.  Bicarb is 14.  He does have an anion gap of 22.  Lactic is elevated at 5.5.  Code sepsis initiated.  Given questionable urine versus cellulitis source, will start patient on broad-spectrum antibiotics.  UA shows positive nitrites, leukocytes, pyuria.  D-dimer is elevated.  Given elevation edema, will plan for CTA.  Patient given 2 L of fluid already with lactic acid improvement to 2.6.  Will give an additional liter of fluids but will  hold off on doing the full 30 cc/kg given concerns that he still might have a possible PE and do not want to add additional strain.  CTA shows no evidence of PE.  Given concerns for sepsis, will plan for hospital admission.  Discussed with hospitalist.  They agree for admission.  Portions of this note were generated with [employer]. Dictation errors may occur despite best attempts at proofreading.  Final Clinical Impressions(s) / ED Diagnoses   Final diagnoses:  Sepsis, due to unspecified organism, unspecified whether acute organ dysfunction present [facility])    ED Discharge Orders    None       Volanda Napoleon, PA-C 06/10/19 1702    Veryl Speak, MD 06/11/19 [phone]

## 2019-06-10 NOTE — Progress Notes (Addendum)
Notified provider and bedside nurse of need to order fluid bolus.  Response from provider was.... He has had 2L of fluids. He's still in consideration for possible PE so I dind't want to give him the full 5000L incase he has any right heart strain. Thanks. I will document in my note.

## 2019-06-11 DIAGNOSIS — Z9181 History of falling: Secondary | ICD-10-CM

## 2019-06-11 DIAGNOSIS — Z9111 Patient's noncompliance with dietary regimen: Secondary | ICD-10-CM

## 2019-06-11 DIAGNOSIS — E118 Type 2 diabetes mellitus with unspecified complications: Secondary | ICD-10-CM

## 2019-06-11 DIAGNOSIS — Z91120 Patient's intentional underdosing of medication regimen due to financial hardship: Secondary | ICD-10-CM

## 2019-06-11 DIAGNOSIS — M609 Myositis, unspecified: Secondary | ICD-10-CM

## 2019-06-11 DIAGNOSIS — B9561 Methicillin susceptible Staphylococcus aureus infection as the cause of diseases classified elsewhere: Secondary | ICD-10-CM

## 2019-06-11 DIAGNOSIS — R7881 Bacteremia: Secondary | ICD-10-CM

## 2019-06-11 DIAGNOSIS — Z881 Allergy status to other antibiotic agents status: Secondary | ICD-10-CM

## 2019-06-11 DIAGNOSIS — Z599 Problem related to housing and economic circumstances, unspecified: Secondary | ICD-10-CM

## 2019-06-11 LAB — COMPREHENSIVE METABOLIC PANEL
ALT: 39 U/L (ref 0–44)
AST: 67 U/L — ABNORMAL HIGH (ref 15–41)
Albumin: 1.4 g/dL — ABNORMAL LOW (ref 3.5–5.0)
Alkaline Phosphatase: 184 U/L — ABNORMAL HIGH (ref 38–126)
Anion gap: 11 (ref 5–15)
BUN: 19 mg/dL (ref 6–20)
CO2: 20 mmol/L — ABNORMAL LOW (ref 22–32)
Calcium: 7.8 mg/dL — ABNORMAL LOW (ref 8.9–10.3)
Chloride: 102 mmol/L (ref 98–111)
Creatinine, Ser: 0.87 mg/dL (ref 0.61–1.24)
GFR calc Af Amer: >60 mL/min (ref >60)
GFR calc non Af Amer: >60 mL/min (ref >60)
Glucose, Bld: 196 mg/dL — ABNORMAL HIGH (ref 70–99)
Potassium: 5.1 mmol/L (ref 3.5–5.1)
Sodium: 133 mmol/L — ABNORMAL LOW (ref 135–145)
Total Bilirubin: 1.5 mg/dL — ABNORMAL HIGH (ref 0.3–1.2)
Total Protein: 5.8 g/dL — ABNORMAL LOW (ref 6.5–8.1)

## 2019-06-11 LAB — BLOOD CULTURE ID PANEL (REFLEXED)

## 2019-06-11 LAB — GLUCOSE, CAPILLARY
Glucose-Capillary: 186 mg/dL — ABNORMAL HIGH (ref 70–99)
Glucose-Capillary: 185 mg/dL — ABNORMAL HIGH (ref 70–99)
Glucose-Capillary: 197 mg/dL — ABNORMAL HIGH (ref 70–99)
Glucose-Capillary: 174 mg/dL — ABNORMAL HIGH (ref 70–99)
Glucose-Capillary: 246 mg/dL — ABNORMAL HIGH (ref 70–99)

## 2019-06-11 LAB — HEPATITIS C ANTIBODY: HCV Ab: NONREACTIVE

## 2019-06-11 LAB — CBC
HCT: 34.6 % — ABNORMAL LOW (ref 39.0–52.0)
Hemoglobin: 11.5 g/dL — ABNORMAL LOW (ref 13.0–17.0)
MCH: 30.4 pg (ref 26.0–34.0)
MCHC: 33.2 g/dL (ref 30.0–36.0)
MCV: 91.5 fL (ref 80.0–100.0)
Platelets: 150 10*3/uL (ref 150–400)
RBC: 3.78 MIL/uL — ABNORMAL LOW (ref 4.22–5.81)
RDW: 14.4 % (ref 11.5–15.5)
WBC: 16.5 10*3/uL — ABNORMAL HIGH (ref 4.0–10.5)
nRBC: 0 % (ref 0.0–0.2)

## 2019-06-11 MED ORDER — ENSURE MAX PROTEIN PO LIQD
11.0000 [oz_av] | Freq: Every day | ORAL | Status: DC
  Administered 2019-06-11 – 2019-06-27 (×9): 11 [oz_av] via ORAL
  Filled 2019-06-11 (×21): qty 330

## 2019-06-11 MED ORDER — HYDROCODONE-ACETAMINOPHEN 5-325 MG PO TABS
1.0000 | ORAL_TABLET | Freq: Once | ORAL | Status: AC
  Administered 2019-06-11: 1 via ORAL
  Filled 2019-06-11: qty 1

## 2019-06-11 MED ORDER — CEFAZOLIN SODIUM-DEXTROSE 2-4 GM/100ML-% IV SOLN
2.0000 g | Freq: Three times a day (TID) | INTRAVENOUS | Status: DC
  Administered 2019-06-11 – 2019-06-30 (×55): 2 g via INTRAVENOUS
  Filled 2019-06-11 (×60): qty 100

## 2019-06-11 MED ORDER — ADULT MULTIVITAMIN W/MINERALS CH
1.0000 | ORAL_TABLET | Freq: Every day | ORAL | Status: DC
  Administered 2019-06-11 – 2019-06-30 (×19): 1 via ORAL
  Filled 2019-06-11 (×19): qty 1

## 2019-06-11 MED ORDER — KETOROLAC TROMETHAMINE 30 MG/ML IJ SOLN
30.0000 mg | Freq: Four times a day (QID) | INTRAMUSCULAR | Status: DC | PRN
  Administered 2019-06-11 – 2019-06-12 (×4): 30 mg via INTRAVENOUS
  Filled 2019-06-11 (×4): qty 1

## 2019-06-11 NOTE — Consult Note (Signed)
[facility]       Reason for Consult: MSSA bacteremia    Referring Physician: Marylu Lund, MD  Active Problems:   Acute lower UTI   Sepsis ([place])   Uncontrolled diabetes mellitus ([place])   Obesity, Class III, BMI 40-49.9 (morbid obesity) ([place])   Cellulitis, gluteal, left   . cholecalciferol  2,000 Units Oral Daily  . heparin  5,000 Units Subcutaneous Q8H  . insulin aspart  0-15 Units Subcutaneous TID WC  . insulin aspart  0-5 Units Subcutaneous QHS  . insulin aspart  8 Units Subcutaneous TID WC  . insulin glargine  25 Units Subcutaneous Daily  . multivitamin with minerals  1 tablet Oral Daily  . Ensure Max Protein  11 oz Oral Daily  . vitamin C  1,000 mg Oral Daily    Recommendations: 1. MSSA bacteremia -both of the patient's initial blood cultures are positive for MSSA.  Will better target his antibiotics by changing his regimen to cefazolin 2 g IV every 8 hours for now.  We will check a transthoracic echocardiogram, recognizing that his body habitus may preclude good acoustic windows and I suspect he will likely need a TEE to fully exclude endocarditis.  We will repeat blood cultures x2 today as well.  Duration of treatment will depend mostly on the rapidity in which he clears his bacteremia and his echocardiographic findings.  2. LT hip myositis -the patient's left hip CT scan suggested myositis only without a distinct abscess.  Unfortunately, the study was done without contrast and may be rather poorly sensitive to exclude this possibility.  We will check a CK to determine the extent to which the patient has muscle inflammation and have asked the patient to make efforts to ambulate or at least bear weight on his leg daily to assist with clinical assessments.  In the event that he is unable to tolerate ambulation or weightbearing,, I would have a low threshold for ordering an MRI of the patient's left hip and/or consulting orthopedics for a possible  aspiration to his left hip to exclude a septic joint.  If the patient promptly clears his bacteremia, I would favor at minimum a 3-week duration of treatment given his active myositis and other complicating factors.  3. Poorly controlled DM -the patient admits to poor glycemic control at home at least over the last 2 months when he has been without his glucometer.  He has had significant social and financial stressors that have contributed to his poor compliance.  His primary care physician has been making efforts to assist with his access to affordable insulin.  The patient should meet with a diabetic educator to ensure that he understands proper diet, need for glucometer use and monitoring of his blood sugars, and consistent use of insulin.  I would aim for tight glycemic control with blood sugars no greater than 150 to optimize the patient's infectious outcome.  Assessment: The patient is a 53 y/o morbidly obese male poorly controlled diabetic with multiple recent falls resulting in a LT gluteal hematoma presenting with fevers, leukocytosis, LT hip pain, and MSSA bacteremia.   Antibiotics: Vancomycin, day 2 Ceftriaxone, day 2  HPI: Robert Sullivan is a 53 y.o. morbidly obese white male poorly controlled diabetic with a known LT gluteal hematoma following several falls at home who was admitted on December 1,2020 with fever, leukocytosis, LT hip pain, and MSSA bacteremia.  Over the past month, the patient reports at least 3 falls at home.  Following his second fall approximately 1 month ago, he presented to [facility] where he had an approximate 1 week admission for left hip pain and was found to have a substantial left gluteal hematoma.  He denies recently taking any blood thinners but does have poorly controlled diabetes for which he has not checked his blood sugars at home in over 2 months.  When he did last check his blood sugars, he was consistently running near 400.  He states that for the  past 2 months his glucometer has been in his desk at his office rather than at home where he had access to it.  He also states that due to cost, he has been taking insulin prescribed to a friend of his that gets "extra" from the [place].  His primary care physician has recently assisted him with the cost of his medications, bringing it down to approximately $80 per month.  He also admits to being poorly compliant with his diabetic diet.  Following his last hospitalization, the patient was sent home on 6 days of Bactrim following prolonged IV antibiotics.  Unfortunately, he developed a disseminated rash while he was on Bactrim but did complete the recommended 6 days of treatment he had remaining.  He continued to have difficulty with ambulation and ultimately fell 1 time further approximately 3 to 4 days prior to admission.  He denies any syncope with any of his 3 falls in the past month.  He complains of intense left hip pain that his limited his ability to ambulate.  For the past 5 to 7 days prior to admission, the patient began developing low-grade fevers and erythema/tenderness at his left hip/upper lateral leg.  He has remained afebrile here thus far, but his white blood cell count was initially elevated to 20,400.  Blood cultures obtained on admission are now positive for MSSA.  The patient was empirically started on vancomycin and cefepime in the ER.  A CT pulmonary angiogram showed no evidence of a pulmonary embolism and a CT scan of the left hip showed a heterogenous appearance of enlargement of the gluteal musculature and surrounding soft tissue inflammation concerning for myositis versus resolving hematoma.  No definite loculated fluid collection was observed.  He admits he is ambulated minimally since his admission here and over the last 1 week. Fever curve, WBC & Cr trends, imaging, cx results, and ABX usage all independently reviewed  Review of Systems:  Review of Systems  Constitutional: Positive for  chills, fever and malaise/fatigue. Negative for weight loss.  HENT: Negative for congestion, hearing loss, sinus pain and sore throat.   Eyes: Negative for blurred vision, photophobia and discharge.  Respiratory: Negative for cough, hemoptysis and shortness of breath.   Cardiovascular: Negative for chest pain, palpitations, orthopnea and leg swelling.  Gastrointestinal: Negative for abdominal pain, constipation, diarrhea, heartburn, nausea and vomiting.  Genitourinary: Negative for dysuria, flank pain, frequency and urgency.  Musculoskeletal: Positive for joint pain and myalgias. Negative for back pain.  Skin: Positive for rash. Negative for itching.  Neurological: Positive for weakness. Negative for tremors, seizures and headaches.  Endo/Heme/Allergies: Negative for polydipsia. Does not bruise/bleed easily.  Psychiatric/Behavioral: Negative for depression and substance abuse. The patient is not nervous/anxious and does not have insomnia.      All other systems reviewed and are negative    Past Medical History:  Diagnosis Date  . Arthritis 05/13/2019   knees  . Diabetes mellitus without complication ([place])   . Headache   .  Sleep apnea     Social History   Tobacco Use  . Smoking status: Never Smoker  . Smokeless tobacco: Never Used  Substance Use Topics  . Alcohol use: Not Currently  . Drug use: Not Currently    No family history on file.   Current Facility-Administered Medications:  .  ceFAZolin (ANCEF) IVPB 2g/100 mL premix, 2 g, Intravenous, Q8H, Donne Hazel, MD .  cholecalciferol (VITAMIN D3) tablet 2,000 Units, 2,000 Units, Oral, Daily, Domenic Polite, MD, 2,000 Units at 06/11/19 0826 .  heparin injection 5,000 Units, 5,000 Units, Subcutaneous, Q8H, Domenic Polite, MD, 5,000 Units at 06/11/19 1321 .  insulin aspart (novoLOG) injection 0-15 Units, 0-15 Units, Subcutaneous, TID WC, Domenic Polite, MD, 3 Units at 06/11/19 1215 .  insulin aspart (novoLOG) injection  0-5 Units, 0-5 Units, Subcutaneous, QHS, Domenic Polite, MD, 2 Units at 06/10/19 2155 .  insulin aspart (novoLOG) injection 8 Units, 8 Units, Subcutaneous, TID WC, Domenic Polite, MD, 8 Units at 06/11/19 1214 .  insulin glargine (LANTUS) injection 25 Units, 25 Units, Subcutaneous, Daily, Domenic Polite, MD, 25 Units at 06/10/19 1837 .  ketorolac (TORADOL) 30 MG/ML injection 30 mg, 30 mg, Intravenous, Q6H PRN, Donne Hazel, MD, 30 mg at 06/11/19 0826 .  multivitamin with minerals tablet 1 tablet, 1 tablet, Oral, Daily, Donne Hazel, MD, 1 tablet at 06/11/19 1214 .  protein supplement (ENSURE MAX) liquid, 11 oz, Oral, Daily, Donne Hazel, MD, 11 oz at 06/11/19 1214 .  vitamin C (ASCORBIC ACID) tablet 1,000 mg, 1,000 mg, Oral, Daily, Domenic Polite, MD, 1,000 mg at 06/11/19 0825  Allergies  Allergen Reactions  . Sulfa Antibiotics     Total body rash    Vitals:   06/11/19 0607 06/11/19 0939  BP: 124/64 (!) 107/56  Pulse: 100 92  Resp: 15 18  Temp: 97.7 F (36.5 C) 98.6 F (37 C)  SpO2: 95% 96%     Physical Exam Gen: pleasant, anxious, morbidly obese, moderate distress secondary to LT hip/gluteal pain, A&Ox 3 Head: NCAT, no temporal wasting evident EENT: PERRL, EOMI, MMM, adequate dentition Neck: supple, no JVD CV: tachycardic rate, RR, no murmurs evident but heart sounds are distant due to pt's body habitus Pulm: CTA bilaterally, no wheeze or retractions Abd: soft,obese with large pannus, NTND, +BS (hypoactive) Extrems:  1+ non-pitting LE edema, 1+ pulses MSK: LT hip with significant TTP anf induration along lateral upper thigh and slight overlying erythema, decreased ROM to LT hip Skin: slight erythema to LT hip as noted above, poor fingernail hygeine, adequate skin turgor Neuro: CN II-XII grossly intact, no focal neurologic deficits appreciated, gait was unable to be assessed, A&Ox 3   Lab Results  Component Value Date   WBC 16.5 (H) 06/11/2019   HGB 11.5 (L)  06/11/2019   HCT 34.6 (L) 06/11/2019   MCV 91.5 06/11/2019   PLT 150 06/11/2019    Lab Results  Component Value Date   CREATININE 0.87 06/11/2019   BUN 19 06/11/2019   NA 133 (L) 06/11/2019   K 5.1 06/11/2019   CL 102 06/11/2019   CO2 20 (L) 06/11/2019    Lab Results  Component Value Date   ALT 39 06/11/2019   AST 67 (H) 06/11/2019   ALKPHOS 184 (H) 06/11/2019     Microbiology: Recent Results (from the past 240 hour(s))  Blood culture (routine x 2)     Status: None (Preliminary result)   Collection Time: 06/10/19  1:25 PM   Specimen:  BLOOD  Result Value Ref Range Status   Specimen Description BLOOD LEFT ANTECUBITAL  Final   Special Requests   Final    BOTTLES DRAWN AEROBIC AND ANAEROBIC Blood Culture adequate volume   Culture  Setup Time   Final    GRAM POSITIVE COCCI IN CLUSTERS IN BOTH AEROBIC AND ANAEROBIC BOTTLES CRITICAL RESULT CALLED TO, READ BACK BY AND VERIFIED WITH: K. AMEND PHARMD, AT 0701 06/11/19 BY Rush Landmark Performed at [facility], [place] [address] [place], [place] [address]    Culture GRAM POSITIVE COCCI  Final   Report Status PENDING  Incomplete  Blood culture (routine x 2)     Status: None (Preliminary result)   Collection Time: 06/10/19  1:35 PM   Specimen: BLOOD  Result Value Ref Range Status   Specimen Description BLOOD BLOOD RIGHT FOREARM  Final   Special Requests   Final    BOTTLES DRAWN AEROBIC AND ANAEROBIC Blood Culture adequate volume   Culture  Setup Time   Final    GRAM POSITIVE COCCI AEROBIC BOTTLE ONLY Organism ID to follow CRITICAL VALUE NOTED.  VALUE IS CONSISTENT WITH PREVIOUSLY REPORTED AND CALLED VALUE.    Culture   Final    NO GROWTH < 24 HOURS Performed at [facility], [place] [address], [place], [place] [address]    Report Status PENDING  Incomplete  Blood Culture ID Panel (Reflexed)     Status: Abnormal   Collection Time: 06/10/19  1:35 PM  Result Value Ref Range Status   Enterococcus species NOT DETECTED NOT  DETECTED Final   Listeria monocytogenes NOT DETECTED NOT DETECTED Final   Staphylococcus species DETECTED (A) NOT DETECTED Final    Comment: CRITICAL RESULT CALLED TO, READ BACK BY AND VERIFIED WITH: Andres Shad PharmD 14:55 06/11/19 ([email or web address])    Staphylococcus aureus (BCID) DETECTED (A) NOT DETECTED Final    Comment: Methicillin (oxacillin) susceptible Staphylococcus aureus (MSSA). Preferred therapy is anti staphylococcal beta lactam antibiotic (Cefazolin or Nafcillin), unless clinically contraindicated. CRITICAL RESULT CALLED TO, READ BACK BY AND VERIFIED WITH: Andres Shad PharmD 14:55 06/11/19 ([email or web address])    Methicillin resistance NOT DETECTED NOT DETECTED Final   Streptococcus species NOT DETECTED NOT DETECTED Final   Streptococcus agalactiae NOT DETECTED NOT DETECTED Final   Streptococcus pneumoniae NOT DETECTED NOT DETECTED Final   Streptococcus pyogenes NOT DETECTED NOT DETECTED Final   Acinetobacter baumannii NOT DETECTED NOT DETECTED Final   Enterobacteriaceae species NOT DETECTED NOT DETECTED Final   Enterobacter cloacae complex NOT DETECTED NOT DETECTED Final   Escherichia coli NOT DETECTED NOT DETECTED Final   Klebsiella oxytoca NOT DETECTED NOT DETECTED Final   Klebsiella pneumoniae NOT DETECTED NOT DETECTED Final   Proteus species NOT DETECTED NOT DETECTED Final   Serratia marcescens NOT DETECTED NOT DETECTED Final   Haemophilus influenzae NOT DETECTED NOT DETECTED Final   Neisseria meningitidis NOT DETECTED NOT DETECTED Final   Pseudomonas aeruginosa NOT DETECTED NOT DETECTED Final   Candida albicans NOT DETECTED NOT DETECTED Final   Candida glabrata NOT DETECTED NOT DETECTED Final   Candida krusei NOT DETECTED NOT DETECTED Final   Candida parapsilosis NOT DETECTED NOT DETECTED Final   Candida tropicalis NOT DETECTED NOT DETECTED Final    Comment: Performed at [facility], [address] [place], [place] [address]  Urine culture     Status: Abnormal (Preliminary result)    Collection Time: 06/10/19  1:45 PM   Specimen: Urine, Clean Catch  Result Value  Ref Range Status   Specimen Description URINE, CLEAN CATCH  Final   Special Requests URINE, RANDOM  Final   Culture (A)  Final    >=100,000 COLONIES/mL STAPHYLOCOCCUS AUREUS SUSCEPTIBILITIES TO FOLLOW Performed at [facility], [address], [place], [place] [address]    Report Status PENDING  Incomplete  SARS CORONAVIRUS 2 (TAT 6-24 HRS) Nasopharyngeal Nasopharyngeal Swab     Status: None   Collection Time: 06/10/19  6:40 PM   Specimen: Nasopharyngeal Swab  Result Value Ref Range Status   SARS Coronavirus 2 NEGATIVE NEGATIVE Final    Comment: (NOTE) SARS-CoV-2 target nucleic acids are NOT DETECTED. The SARS-CoV-2 RNA is generally detectable in upper and lower respiratory specimens during the acute phase of infection. Negative results do not preclude SARS-CoV-2 infection, do not rule out co-infections with other pathogens, and should not be used as the sole basis for treatment or other patient management decisions. Negative results must be combined with clinical observations, patient history, and epidemiological information. The expected result is Negative. Fact Sheet for Patients: SugarRoll.be Fact Sheet for Healthcare Providers: https://www.woods-mathews.com/ This test is not yet approved or cleared by the [place] FDA and  has been authorized for detection and/or diagnosis of SARS-CoV-2 by FDA under an Emergency Use Authorization (EUA). This EUA will remain  in effect (meaning this test can be used) for the duration of the COVID-19 declaration under Section 56 4(b)(1) of the Act, 21 U.S.C. section 360bbb-3(b)(1), unless the authorization is terminated or revoked sooner. Performed at [facility], [place] [address] [place], [place] [address]     Shey Yott N Khamil Lamica, [place] [facility]  [email or web address] 06/11/2019, 3:19 PM

## 2019-06-11 NOTE — Progress Notes (Signed)
PROGRESS NOTE    Robert Sullivan  IBB:[id number] DOB: Apr 22, 1966 DOA: 06/10/2019 PCP: Patient, No Pcp Per    Brief Narrative:  53 y.o. male with history of uncontrolled type 2 diabetes mellitus, morbid obesity was admitted to [facility] from 11/2-11/7 with E. coli UTI, uncontrolled diabetes and a fall that resulted in a left gluteal hematoma, he was treated with IV antibiotics, discharged home on Bactrim, completed course and also on insulin. -He felt well initially after discharge, in the last 7 to 10 days he started having fevers and chills, profuse sweats, 5 to 6 days ago he started having increased pain and swelling at the site of his gluteal hematoma, he also noticed that his urine was darker but denied any dysuria or change in odor presented to the emergency room today with weakness, worsening left hip pain, fevers chills and sweats. -In the ED he was noted to have a sodium of 131, CBG of 380, albumin of 1.8, ALP of 224,  lactic acid of 5.5, white count of 20,000.  In the ED he was noted to have an elevated D-dimer subsequently EDP proceeded with CT angiogram which was negative for pulmonary embolism, his UA was significantly abnormal with cloudy urine, positive nitrite, leukocyte esterase, numerous WBC and bacteria.  In addition noted to have redness and tenderness of his left gluteal region  Assessment & Plan:   Active Problems:   Acute lower UTI   Sepsis ([place])   Uncontrolled diabetes mellitus (HCC)   Obesity, Class III, BMI 40-49.9 (morbid obesity) (HCC)   Cellulitis, gluteal, left  Sepsis secondary to MSSA bacteremia -Blood cx pos for MSSA, ID now consulted -discussed with pharmacy, plan to continue on ancef -CT reviewed, no drainable abscess identified -Add IV antibiotics to treat this cellulitis versus abscess -Lactate improving  Urinary tract infection -Now on ancef -Presenting UA suggestive of UTI, however, pt denies dysuria  Left gluteal hematoma with cellulitis, possible  abscess -Per above, pt now on ancef  Morbid obesity -BMI of 47 -Recommend diet/lifestyle modification  Uncontrolled type 2 diabetes mellitus -Hemoglobin A1c was 11 recently -Resume Lantus and NovoLog premeal and sliding scale insulin -Stable at this time  Suspected cirrhosis -Noted on imaging, patient denies any alcohol use currently but did drink regularly in the past, suspect NASH/fatty liver disease is likely the primary culprit -Hep C neg -Recommend GI f/u on d/c  DVT prophylaxis: Heparin subQ Code Status: Full Family Communication: Pt in room, family not at bedside Disposition Plan: Uncertain at this time  Consultants:   ID  Procedures:     Antimicrobials: Anti-infectives (From admission, onward)   Start     Dose/Rate Route Frequency Ordered Stop   06/11/19 2200  ceFAZolin (ANCEF) IVPB 2g/100 mL premix     2 g 200 mL/hr over 30 Minutes Intravenous Every 8 hours 06/11/19 1456     06/11/19 0200  vancomycin (VANCOCIN) 1,750 mg in sodium chloride 0.9 % 500 mL IVPB  Status:  Discontinued     1,750 mg 250 mL/hr over 120 Minutes Intravenous Every 12 hours 06/10/19 1257 06/11/19 1456   06/10/19 2200  ceFEPIme (MAXIPIME) 2 g in sodium chloride 0.9 % 100 mL IVPB  Status:  Discontinued     2 g 200 mL/hr over 30 Minutes Intravenous Every 8 hours 06/10/19 1257 06/10/19 1740   06/10/19 1745  cefTRIAXone (ROCEPHIN) 1 g in sodium chloride 0.9 % 100 mL IVPB  Status:  Discontinued     1 g 200 mL/hr over 30  Minutes Intravenous Every 24 hours 06/10/19 1740 06/11/19 1456   06/10/19 1245  ceFEPIme (MAXIPIME) 2 g in sodium chloride 0.9 % 100 mL IVPB     2 g 200 mL/hr over 30 Minutes Intravenous  Once 06/10/19 1239 06/10/19 1411   06/10/19 1245  vancomycin (VANCOCIN) IVPB 1000 mg/200 mL premix  Status:  Discontinued     1,000 mg 200 mL/hr over 60 Minutes Intravenous  Once 06/10/19 1239 06/10/19 1241   06/10/19 1245  vancomycin (VANCOCIN) 2,500 mg in sodium chloride 0.9 % 500 mL  IVPB     2,500 mg 250 mL/hr over 120 Minutes Intravenous  Once 06/10/19 1241 06/10/19 1635       Subjective: Complains of marked L thigh pain  Objective: Vitals:   06/11/19 0301 06/11/19 0310 06/11/19 0607 06/11/19 0939  BP:  115/62 124/64 (!) 107/56  Pulse:  (!) 104 100 92  Resp:  18 15 18   Temp:  98 F (36.7 C) 97.7 F (36.5 C) 98.6 F (37 C)  TempSrc:   Oral Oral  SpO2:  95% 95% 96%  Weight: (!) 166.1 kg  (!) 166.1 kg   Height: 6' 3"  (1.905 m)       Intake/Output Summary (Last 24 hours) at 06/11/2019 1613 Last data filed at 06/11/2019 0900 Gross per 24 hour  Intake 3737.5 ml  Output 350 ml  Net 3387.5 ml   Filed Weights   06/10/19 1214 06/11/19 0301 06/11/19 0607  Weight: (!) 172.4 kg (!) 166.1 kg (!) 166.1 kg    Examination:  General exam: Appears calm and comfortable  Respiratory system: Clear to auscultation. Respiratory effort normal. Cardiovascular system: S1 & S2 heard, Regular Gastrointestinal system: Abdomen is nondistended, soft and nontender. No organomegaly or masses felt. Normal bowel sounds heard. Central nervous system: Alert and oriented. No focal neurological deficits. Extremities: Symmetric 5 x 5 power. Skin: No rashes, lesions Psychiatry: Judgement and insight appear normal. Mood & affect appropriate.   Data Reviewed: I have personally reviewed following labs and imaging studies  CBC: Recent Labs  Lab 06/10/19 1129 06/11/19 0721  WBC 20.4* 16.5*  NEUTROABS 17.6*  --   HGB 13.6 11.5*  HCT 41.0 34.6*  MCV 92.3 91.5  PLT 198 235   Basic Metabolic Panel: Recent Labs  Lab 06/10/19 1129 06/11/19 0721  NA 131* 133*  K 4.8 5.1  CL 95* 102  CO2 14* 20*  GLUCOSE 380* 196*  BUN 16 19  CREATININE 1.09 0.87  CALCIUM 8.6* 7.8*   GFR: Estimated Creatinine Clearance: 164.5 mL/min (by C-G formula based on SCr of 0.87 mg/dL). Liver Function Tests: Recent Labs  Lab 06/10/19 1129 06/11/19 0721  AST 72* 67*  ALT 44 39  ALKPHOS 224*  184*  BILITOT 1.7* 1.5*  PROT 7.4 5.8*  ALBUMIN 1.8* 1.4*   No results for input(s): LIPASE, AMYLASE in the last 168 hours. No results for input(s): AMMONIA in the last 168 hours. Coagulation Profile: Recent Labs  Lab 06/10/19 1345  INR 1.4*   Cardiac Enzymes: Recent Labs  Lab 06/10/19 1129  CKTOTAL 23*   BNP (last 3 results) No results for input(s): PROBNP in the last 8760 hours. HbA1C: No results for input(s): HGBA1C in the last 72 hours. CBG: Recent Labs  Lab 06/10/19 1826 06/10/19 2139 06/11/19 0310 06/11/19 0655 06/11/19 1150  GLUCAP 266* 241* 186* 185* 197*   Lipid Profile: No results for input(s): CHOL, HDL, LDLCALC, TRIG, CHOLHDL, LDLDIRECT in the last 72 hours. Thyroid Function  Tests: No results for input(s): TSH, T4TOTAL, FREET4, T3FREE, THYROIDAB in the last 72 hours. Anemia Panel: No results for input(s): VITAMINB12, FOLATE, FERRITIN, TIBC, IRON, RETICCTPCT in the last 72 hours. Sepsis Labs: Recent Labs  Lab 06/10/19 1129 06/10/19 1345  LATICACIDVEN 5.5* 2.6*    Recent Results (from the past 240 hour(s))  Blood culture (routine x 2)     Status: None (Preliminary result)   Collection Time: 06/10/19  1:25 PM   Specimen: BLOOD  Result Value Ref Range Status   Specimen Description BLOOD LEFT ANTECUBITAL  Final   Special Requests   Final    BOTTLES DRAWN AEROBIC AND ANAEROBIC Blood Culture adequate volume   Culture  Setup Time   Final    GRAM POSITIVE COCCI IN CLUSTERS IN BOTH AEROBIC AND ANAEROBIC BOTTLES CRITICAL RESULT CALLED TO, READ BACK BY AND VERIFIED WITH: K. AMEND PHARMD, AT 0701 06/11/19 BY Rush Landmark Performed at [facility], [place] [address] [place], [place] [address]    Culture GRAM POSITIVE COCCI  Final   Report Status PENDING  Incomplete  Blood culture (routine x 2)     Status: None (Preliminary result)   Collection Time: 06/10/19  1:35 PM   Specimen: BLOOD  Result Value Ref Range Status   Specimen Description BLOOD BLOOD  RIGHT FOREARM  Final   Special Requests   Final    BOTTLES DRAWN AEROBIC AND ANAEROBIC Blood Culture adequate volume   Culture  Setup Time   Final    GRAM POSITIVE COCCI AEROBIC BOTTLE ONLY Organism ID to follow CRITICAL VALUE NOTED.  VALUE IS CONSISTENT WITH PREVIOUSLY REPORTED AND CALLED VALUE.    Culture   Final    NO GROWTH < 24 HOURS Performed at [facility], [place] [address], [place], [place] [address]    Report Status PENDING  Incomplete  Blood Culture ID Panel (Reflexed)     Status: Abnormal   Collection Time: 06/10/19  1:35 PM  Result Value Ref Range Status   Enterococcus species NOT DETECTED NOT DETECTED Final   Listeria monocytogenes NOT DETECTED NOT DETECTED Final   Staphylococcus species DETECTED (A) NOT DETECTED Final    Comment: CRITICAL RESULT CALLED TO, READ BACK BY AND VERIFIED WITH: Andres Shad PharmD 14:55 06/11/19 ([email or web address])    Staphylococcus aureus (BCID) DETECTED (A) NOT DETECTED Final    Comment: Methicillin (oxacillin) susceptible Staphylococcus aureus (MSSA). Preferred therapy is anti staphylococcal beta lactam antibiotic (Cefazolin or Nafcillin), unless clinically contraindicated. CRITICAL RESULT CALLED TO, READ BACK BY AND VERIFIED WITH: Andres Shad PharmD 14:55 06/11/19 ([email or web address])    Methicillin resistance NOT DETECTED NOT DETECTED Final   Streptococcus species NOT DETECTED NOT DETECTED Final   Streptococcus agalactiae NOT DETECTED NOT DETECTED Final   Streptococcus pneumoniae NOT DETECTED NOT DETECTED Final   Streptococcus pyogenes NOT DETECTED NOT DETECTED Final   Acinetobacter baumannii NOT DETECTED NOT DETECTED Final   Enterobacteriaceae species NOT DETECTED NOT DETECTED Final   Enterobacter cloacae complex NOT DETECTED NOT DETECTED Final   Escherichia coli NOT DETECTED NOT DETECTED Final   Klebsiella oxytoca NOT DETECTED NOT DETECTED Final   Klebsiella pneumoniae NOT DETECTED NOT DETECTED Final   Proteus species NOT DETECTED NOT DETECTED Final    Serratia marcescens NOT DETECTED NOT DETECTED Final   Haemophilus influenzae NOT DETECTED NOT DETECTED Final   Neisseria meningitidis NOT DETECTED NOT DETECTED Final   Pseudomonas aeruginosa NOT DETECTED NOT DETECTED Final   Candida albicans NOT DETECTED NOT DETECTED Final  Candida glabrata NOT DETECTED NOT DETECTED Final   Candida krusei NOT DETECTED NOT DETECTED Final   Candida parapsilosis NOT DETECTED NOT DETECTED Final   Candida tropicalis NOT DETECTED NOT DETECTED Final    Comment: Performed at [facility], [place] [address], [place], [place] [address]  Urine culture     Status: Abnormal (Preliminary result)   Collection Time: 06/10/19  1:45 PM   Specimen: Urine, Clean Catch  Result Value Ref Range Status   Specimen Description URINE, CLEAN CATCH  Final   Special Requests URINE, RANDOM  Final   Culture (A)  Final    >=100,000 COLONIES/mL STAPHYLOCOCCUS AUREUS SUSCEPTIBILITIES TO FOLLOW Performed at [facility], [place] [address] [place], [place] [address]    Report Status PENDING  Incomplete  SARS CORONAVIRUS 2 (TAT 6-24 HRS) Nasopharyngeal Nasopharyngeal Swab     Status: None   Collection Time: 06/10/19  6:40 PM   Specimen: Nasopharyngeal Swab  Result Value Ref Range Status   SARS Coronavirus 2 NEGATIVE NEGATIVE Final    Comment: (NOTE) SARS-CoV-2 target nucleic acids are NOT DETECTED. The SARS-CoV-2 RNA is generally detectable in upper and lower respiratory specimens during the acute phase of infection. Negative results do not preclude SARS-CoV-2 infection, do not rule out co-infections with other pathogens, and should not be used as the sole basis for treatment or other patient management decisions. Negative results must be combined with clinical observations, patient history, and epidemiological information. The expected result is Negative. Fact Sheet for Patients: SugarRoll.be Fact Sheet for Healthcare Providers:  https://www.woods-mathews.com/ This test is not yet approved or cleared by the [place] FDA and  has been authorized for detection and/or diagnosis of SARS-CoV-2 by FDA under an Emergency Use Authorization (EUA). This EUA will remain  in effect (meaning this test can be used) for the duration of the COVID-19 declaration under Section 56 4(b)(1) of the Act, 21 U.S.C. section 360bbb-3(b)(1), unless the authorization is terminated or revoked sooner. Performed at [facility], [place] [address] [place], [place] [address]      Radiology Studies: Ct Angio Chest Pe W And/or Wo Contrast  Result Date: 06/10/2019 CLINICAL DATA:  Chest pain. EXAM: CT ANGIOGRAPHY CHEST WITH CONTRAST TECHNIQUE: Multidetector CT imaging of the chest was performed using the standard protocol during bolus administration of intravenous contrast. Multiplanar CT image reconstructions and MIPs were obtained to evaluate the vascular anatomy. CONTRAST:  56m OMNIPAQUE IOHEXOL 350 MG/ML SOLN COMPARISON:  None. FINDINGS: Cardiovascular: There does not appear to be any definite evidence of large central pulmonary embolus in the main pulmonary artery or main portions of the left or right pulmonary arteries. However, there is limited opacification of the more peripheral branches, and therefore smaller peripheral pulmonary emboli cannot be excluded on the basis of this exam. There is no evidence of thoracic aortic dissection or aneurysm. Normal cardiac size. No pericardial effusion. Mediastinum/Nodes: No enlarged mediastinal, hilar, or axillary lymph nodes. Thyroid gland, trachea, and esophagus demonstrate no significant findings. Lungs/Pleura: Lungs are clear. No pleural effusion or pneumothorax. Upper Abdomen: Mild splenomegaly is noted. Possible nodularity of liver is noted suggesting possible hepatic cirrhosis. Musculoskeletal: No chest wall abnormality. No acute or significant osseous findings. Review of the MIP  images confirms the above findings. IMPRESSION: 1. There is no definite evidence of large central pulmonary embolus in the main pulmonary artery or main portions of the left or right pulmonary arteries. However, there is limited opacification of the more peripheral branches,  and therefore smaller peripheral pulmonary emboli cannot be excluded on the basis of this exam. 2. Mild splenomegaly is noted. Possible nodularity of liver is noted suggesting possible hepatic cirrhosis. Electronically Signed   By: Marijo Conception M.D.   On: 06/10/2019 16:19   Ct Hip Left Wo Contrast  Result Date: 06/10/2019 CLINICAL DATA:  Hip swelling and infection EXAM: CT OF THE LEFT HIP WITHOUT CONTRAST TECHNIQUE: Multidetector CT imaging of the left hip was performed according to the standard protocol. Multiplanar CT image reconstructions were also generated. COMPARISON:  May 14, 2019 MRI FINDINGS: Bones/Joint/Cartilage No fracture or dislocation. There is moderate bilateral hip osteoarthritis with superior joint space loss and marginal osteophyte formation. No large joint effusion is seen. Ligaments Suboptimally assessed by CT. Muscles and Tendons Within the gluteal musculature there is enlargement and heterogeneous appearance to the gluteal musculature. No definite loculated fluid collection is seen, however limited due to technique. Soft tissues Overlying subcutaneous edema and soft tissue stranding which extends over the greater trochanter. IMPRESSION: Heterogeneous appearance with enlargement of the gluteal musculature with surrounding soft tissue inflammatory changes and skin thickening. No definite loculated fluid collection. This could be due to resolving hematoma or myositis. If further evaluation is required, would recommend MRI. Electronically Signed   By: Prudencio Pair M.D.   On: 06/10/2019 21:13    Scheduled Meds: . cholecalciferol  2,000 Units Oral Daily  . heparin  5,000 Units Subcutaneous Q8H  . insulin aspart   0-15 Units Subcutaneous TID WC  . insulin aspart  0-5 Units Subcutaneous QHS  . insulin aspart  8 Units Subcutaneous TID WC  . insulin glargine  25 Units Subcutaneous Daily  . multivitamin with minerals  1 tablet Oral Daily  . Ensure Max Protein  11 oz Oral Daily  . vitamin C  1,000 mg Oral Daily   Continuous Infusions: .  ceFAZolin (ANCEF) IV       LOS: 1 day   Marylu Lund, MD [facility] Pager On Amion  If 7PM-7AM, please contact night-coverage 06/11/2019, 4:13 PM

## 2019-06-11 NOTE — Progress Notes (Signed)
Initial Nutrition Assessment  DOCUMENTATION CODES:   Morbid obesity  INTERVENTION:    Ensure MAX Protein po daily, each supplement provides 150 kcal and 30 grams of protein  MVI daily   NUTRITION DIAGNOSIS:   Increased nutrient needs related to acute illness as evidenced by estimated needs.  GOAL:   Patient will meet greater than or equal to 90% of their needs  MONITOR:   PO intake, Supplement acceptance, Weight trends, Labs, I & O's, Skin  REASON FOR ASSESSMENT:   Malnutrition Screening Tool    ASSESSMENT:   Patient with PMH significant for DM. Recently admitted to [facility] from 11/2-11/7 with E.Coli UTI. Presents this admission with sepsis from possible L gluteal cellulitis.   Pt reports having a loss of appetite starting after his last admission. States he is typically a "plate finisher" but during this time he could only finish small portions. When discharged he found himself eating B- small bowl of cereal L- lean cuisine D- lean cuisine. Does not use supplementation. Discussed the importance of protein intake for preservation of lean body mass. Pt willing to try Ensure MAX.   Pt endorses a UBW of 420 lb and an unintentional weight loss of 50 lb in the last month. Records indicate pt weighed 349 lb on 11/2 and 366 lb this admission. Suspect this is related to fluid fluctuation. Pt has difficult time getting around his house and often finds himself eating less so he doesn't have to use the bathroom.   I/O: +4,598 ml since admit  Drips: abx Medications: Vit D, SS novolog, lantus, 1000 mg Vit C Labs: Na 133 (L) CBG 186-266   NUTRITION - FOCUSED PHYSICAL EXAM:    Most Recent Value  Orbital Region  No depletion  Upper Arm Region  Mild depletion  Thoracic and Lumbar Region  No depletion  Buccal Region  No depletion  Temple Region  No depletion  Clavicle Bone Region  No depletion  Clavicle and Acromion Bone Region  No depletion  Scapular Bone Region  No depletion   Dorsal Hand  No depletion  Patellar Region  No depletion  Anterior Thigh Region  No depletion  Posterior Calf Region  No depletion  Edema (RD Assessment)  Mild  Hair  Reviewed  Eyes  Reviewed  Mouth  Reviewed  Skin  Reviewed  Nails  Reviewed     Diet Order:   Diet Order            Diet Carb Modified Fluid consistency: Thin; Room service appropriate? Yes  Diet effective now              EDUCATION NEEDS:   Education needs have been addressed  Skin:  Skin Assessment: Reviewed RN Assessment  Last BM:  PTA  Height:   Ht Readings from Last 1 Encounters:  06/11/19 6' 3"  (1.905 m)    Weight:   Wt Readings from Last 1 Encounters:  06/11/19 (!) 166.1 kg    Ideal Body Weight:  89.1 kg  BMI:  Body mass index is 45.77 kg/m.  Estimated Nutritional Needs:   Kcal:  2400-2600 kcal  Protein:  120-135 grams  Fluid:  >/= 2 L/day   Mariana Single RD, LDN Clinical Nutrition Pager # - [phone]

## 2019-06-11 NOTE — Progress Notes (Signed)
New Admission Note:  Arrival Method: Via stretcher from ED Mental Orientation: Alert & Oriented x4 Telemetry: CCMD verified Assessment: Completed Skin: Refer to flowsheet IV: Right FA & Left AC Pain: 10/10 Safety Measures: Safety Fall Prevention Plan discussed with patient. Admission: Completed [facility] [place] Orientation: Patient has been orientated to the room, unit and the staff.  Orders have been reviewed and are being implemented. Will continue to monitor the patient. Call light has been placed within reach and bed alarm has been activated.   Vassie Moselle, RN  Phone Number: [phone]

## 2019-06-11 NOTE — Progress Notes (Signed)
PHARMACY - PHYSICIAN COMMUNICATION CRITICAL VALUE ALERT - BLOOD CULTURE IDENTIFICATION (BCID)  Robert Sullivan is an 53 y.o. male who presented to [facility] on 06/10/2019 with a chief complaint of fever and chills  Assessment:  Patient admitted with fever, now found to have MSSA bacteremia.    Name of physician (or Provider) Contacted: Dr. Wyline Copas.  Will also let infectious diseases consult service know of patient  Current antibiotics: ceftriaxone + vancomycin  Changes to prescribed antibiotics recommended: Cefazolin 2g IV q8h ordered - vancomycin and ceftriaxone stopped   Results for orders placed or performed during the hospital encounter of 06/10/19  Blood Culture ID Panel (Reflexed) (Collected: 06/10/2019  1:35 PM)  Result Value Ref Range   Enterococcus species NOT DETECTED NOT DETECTED   Listeria monocytogenes NOT DETECTED NOT DETECTED   Staphylococcus species DETECTED (A) NOT DETECTED   Staphylococcus aureus (BCID) DETECTED (A) NOT DETECTED   Methicillin resistance NOT DETECTED NOT DETECTED   Streptococcus species NOT DETECTED NOT DETECTED   Streptococcus agalactiae NOT DETECTED NOT DETECTED   Streptococcus pneumoniae NOT DETECTED NOT DETECTED   Streptococcus pyogenes NOT DETECTED NOT DETECTED   Acinetobacter baumannii NOT DETECTED NOT DETECTED   Enterobacteriaceae species NOT DETECTED NOT DETECTED   Enterobacter cloacae complex NOT DETECTED NOT DETECTED   Escherichia coli NOT DETECTED NOT DETECTED   Klebsiella oxytoca NOT DETECTED NOT DETECTED   Klebsiella pneumoniae NOT DETECTED NOT DETECTED   Proteus species NOT DETECTED NOT DETECTED   Serratia marcescens NOT DETECTED NOT DETECTED   Haemophilus influenzae NOT DETECTED NOT DETECTED   Neisseria meningitidis NOT DETECTED NOT DETECTED   Pseudomonas aeruginosa NOT DETECTED NOT DETECTED   Candida albicans NOT DETECTED NOT DETECTED   Candida glabrata NOT DETECTED NOT DETECTED   Candida krusei NOT DETECTED NOT DETECTED   Candida  parapsilosis NOT DETECTED NOT DETECTED   Candida tropicalis NOT DETECTED NOT DETECTED    Candie Mile 06/11/2019  2:56 PM

## 2019-06-11 NOTE — Progress Notes (Addendum)
Inpatient Diabetes Program Recommendations  AACE/ADA: New Consensus Statement on Inpatient Glycemic Control (2015)  Target Ranges:  Prepandial:   less than 140 mg/dL      Peak postprandial:   less than 180 mg/dL (1-2 hours)      Critically ill patients:  140 - 180 mg/dL   Lab Results  Component Value Date   GLUCAP 185 (H) 06/11/2019   HGBA1C 11.0 (H) 05/13/2019    Review of Glycemic Control Results for STEPHFON, BOVEY (MRN [id number]) as of 06/11/2019 10:57  Ref. Range 06/10/2019 18:26 06/10/2019 21:39 06/11/2019 03:10 06/11/2019 06:55  Glucose-Capillary Latest Ref Range: 70 - 99 mg/dL 266 (H) 241 (H) 186 (H) 185 (H)   Diabetes history: Type 2 DM Outpatient Diabetes medications: Novolog 13 units TID, Lantus 24 units QD (not taking) Current orders for Inpatient glycemic control: Novolog 8 units TID, Novolog 0-15 units TID, Novolog 0-5 units QHS, Lantus 25 units QD  Inpatient Diabetes Program Recommendations:    Per previous hospitalization, DM coordinator spoke with patient regarding A1C on 05/15/2019 and patient had an appointment for follow up on 11/25, however, not seeing visit in documentation. Additionally, patient will need affordable insulin regimen at discharge.   Novolin 70/30 (ReliOn) pen 20 units bid Novolin R (ReliOn) pen 0-15 units tidwc and HS Will need PCP for diabetes management Will need pen needles  Addendum@[email or web address] : Attempted to speak with patient, unable to discuss at this time. Will re attempt at later time.  Thanks, Bronson Curb, MSN, RNC-OB Diabetes Coordinator [phone] (8a-5p)

## 2019-06-11 NOTE — Progress Notes (Signed)
PHARMACY - PHYSICIAN COMMUNICATION CRITICAL VALUE ALERT - BLOOD CULTURE IDENTIFICATION (BCID)  Ho Parisi is an 53 y.o. male who presented to [facility] on 06/10/2019 with a chief complaint of fever and chills  Assessment:  GPC in clusters in 1/4 blood culture bottles - likely contaminant   Name of physician (or Provider) Contacted: Dr. Wyline Copas  Current antibiotics: Rocephin  Changes to prescribed antibiotics recommended:  No additional abx needed  No results found for this or any previous visit.  Sherlon Handing, PharmD, BCPS Please see amion for complete clinical pharmacist phone list 06/11/2019  7:03 AM

## 2019-06-11 NOTE — ED Notes (Addendum)
ED TO INPATIENT HANDOFF REPORT  ED Nurse Name and Phone #:   Harriette Bouillon [phone] S Name/Age/Gender Robert Sullivan 53 y.o. male Room/Bed: 058C/058C  Code Status   Code Status: Full Code  Home/SNF/Other Home Patient oriented to: self, place, time and situation Is this baseline? Yes   Triage Complete: Triage complete  Chief Complaint leg swelling, pain unable to walk  Triage Note Patient c/o chills, sweating, dark urine onset of 11/12. Patient states he was in the hospital for a while after injuring himself last month. Patient states he had a rash after taking the medications he was given but has completed them all. States rash is gone. Also c/o left hip pain with swelling.    Allergies Allergies  Allergen Reactions  . Sulfa Antibiotics     Total body rash    Level of Care/Admitting Diagnosis ED Disposition    ED Disposition Condition [facility] Area: [place] [[id number]]  Level of Care: Telemetry Medical [104]  Covid Evaluation: Asymptomatic Screening Protocol (No Symptoms)  Diagnosis: Sepsis [facility]) [[id number]]  Admitting Physician: Domenic Polite [[id number]]  Attending Physician: Domenic Polite [[id number]]  Estimated length of stay: past midnight tomorrow  Certification:: I certify this patient will need inpatient services for at least 2 midnights  PT Class (Do Not Modify): Inpatient [101]  PT Acc Code (Do Not Modify): Private [1]       B Medical/Surgery History Past Medical History:  Diagnosis Date  . Arthritis 05/13/2019   knees  . Diabetes mellitus without complication ([place])   . Headache   . Sleep apnea    History reviewed. No pertinent surgical history.   A IV Location/Drains/Wounds Patient Lines/Drains/Airways Status   Active Line/Drains/Airways    Name:   Placement date:   Placement time:   Site:   Days:   Peripheral IV 06/10/19 Left Antecubital   06/10/19    1132    Antecubital   1   Peripheral IV 06/10/19 Right Forearm   06/10/19     1342    Forearm   1          Intake/Output Last 24 hours  Intake/Output Summary (Last 24 hours) at 06/11/2019 0209 Last data filed at 06/10/2019 2100 Gross per 24 hour  Intake 3180 ml  Output -  Net 3180 ml    Labs/Imaging Results for orders placed or performed during the hospital encounter of 06/10/19 (from the past 48 hour(s))  Comprehensive metabolic panel     Status: Abnormal   Collection Time: 06/10/19 11:29 AM  Result Value Ref Range   Sodium 131 (L) 135 - 145 mmol/L   Potassium 4.8 3.5 - 5.1 mmol/L   Chloride 95 (L) 98 - 111 mmol/L   CO2 14 (L) 22 - 32 mmol/L   Glucose, Bld 380 (H) 70 - 99 mg/dL   BUN 16 6 - 20 mg/dL   Creatinine, Ser 1.09 0.61 - 1.24 mg/dL   Calcium 8.6 (L) 8.9 - 10.3 mg/dL   Total Protein 7.4 6.5 - 8.1 g/dL   Albumin 1.8 (L) 3.5 - 5.0 g/dL   AST 72 (H) 15 - 41 U/L   ALT 44 0 - 44 U/L   Alkaline Phosphatase 224 (H) 38 - 126 U/L   Total Bilirubin 1.7 (H) 0.3 - 1.2 mg/dL   GFR calc non Af Amer >60 >60 mL/min   GFR calc Af Amer >60 >60 mL/min   Anion gap 22 (H) 5 - 15  Comment: Performed at [facility], [place] [address] [place], [place] [address]  CBC with Differential     Status: Abnormal   Collection Time: 06/10/19 11:29 AM  Result Value Ref Range   WBC 20.4 (H) 4.0 - 10.5 K/uL   RBC 4.44 4.22 - 5.81 MIL/uL   Hemoglobin 13.6 13.0 - 17.0 g/dL   HCT 41.0 39.0 - 52.0 %   MCV 92.3 80.0 - 100.0 fL   MCH 30.6 26.0 - 34.0 pg   MCHC 33.2 30.0 - 36.0 g/dL   RDW 14.2 11.5 - 15.5 %   Platelets 198 150 - 400 K/uL   nRBC 0.0 0.0 - 0.2 %   Neutrophils Relative % 86 %   Neutro Abs 17.6 (H) 1.7 - 7.7 K/uL   Lymphocytes Relative 5 %   Lymphs Abs 1.0 0.7 - 4.0 K/uL   Monocytes Relative 6 %   Monocytes Absolute 1.2 (H) 0.1 - 1.0 K/uL   Eosinophils Relative 1 %   Eosinophils Absolute 0.1 0.0 - 0.5 K/uL   Basophils Relative 0 %   Basophils Absolute 0.1 0.0 - 0.1 K/uL   WBC Morphology See Note     Comment: Increased Bands. >20% Bands    Immature Granulocytes 2 %   Abs Immature Granulocytes 0.34 (H) 0.00 - 0.07 K/uL    Comment: Performed at [facility], [place] [address], [place], [place] [address]  Lactic acid, plasma     Status: Abnormal   Collection Time: 06/10/19 11:29 AM  Result Value Ref Range   Lactic Acid, Venous 5.5 (HH) 0.5 - 1.9 mmol/L    Comment: CRITICAL RESULT CALLED TO, READ BACK BY AND VERIFIED WITH: E.BANKS,RN 1227 06/10/2019 CLARK,S Performed at [facility], [place] [address], [place], [place] [address]   CK     Status: Abnormal   Collection Time: 06/10/19 11:29 AM  Result Value Ref Range   Total CK 23 (L) 49 - 397 U/L    Comment: Performed at [facility], [place] [address], [place], [place] [address]  POC occult blood, ED RN will collect     Status: Abnormal   Collection Time: 06/10/19 12:19 PM  Result Value Ref Range   Fecal Occult Bld POSITIVE (A) NEGATIVE  Urinalysis, Routine w reflex microscopic     Status: Abnormal   Collection Time: 06/10/19  1:45 PM  Result Value Ref Range   Color, Urine AMBER (A) YELLOW    Comment: BIOCHEMICALS MAY BE AFFECTED BY COLOR   APPearance CLOUDY (A) CLEAR   Specific Gravity, Urine 1.026 1.005 - 1.030   pH 5.0 5.0 - 8.0   Glucose, UA 50 (A) NEGATIVE mg/dL   Hgb urine dipstick LARGE (A) NEGATIVE   Bilirubin Urine SMALL (A) NEGATIVE   Ketones, ur NEGATIVE NEGATIVE mg/dL   Protein, ur 100 (A) NEGATIVE mg/dL   Nitrite POSITIVE (A) NEGATIVE   Leukocytes,Ua MODERATE (A) NEGATIVE   RBC / HPF 21-50 0 - 5 RBC/hpf   WBC, UA >50 (H) 0 - 5 WBC/hpf   Bacteria, UA FEW (A) NONE SEEN   Squamous Epithelial / LPF 0-5 0 - 5   Mucus PRESENT    Amorphous Crystal PRESENT     Comment: Performed at [facility], [address] [place], [place] [address]  Lactic acid, plasma     Status: Abnormal   Collection Time: 06/10/19  1:45 PM  Result Value Ref Range   Lactic Acid, Venous 2.6 (  HH) 0.5 - 1.9 mmol/L    Comment: CRITICAL VALUE NOTED.  VALUE IS CONSISTENT  WITH PREVIOUSLY REPORTED AND CALLED VALUE. Performed at [facility], [place] [address], [place], [place] [address]   D-dimer, quantitative (not at [facility])     Status: Abnormal   Collection Time: 06/10/19  1:45 PM  Result Value Ref Range   D-Dimer, Quant 3.04 (H) 0.00 - 0.50 ug/mL-FEU    Comment: (NOTE) At the manufacturer cut-off of 0.50 ug/mL FEU, this assay has been documented to exclude PE with a sensitivity and negative predictive value of 97 to 99%.  At this time, this assay has not been approved by the FDA to exclude DVT/VTE. Results should be correlated with clinical presentation. Performed at [facility], [place] [address] [place], [place] [address]   APTT     Status: None   Collection Time: 06/10/19  1:45 PM  Result Value Ref Range   aPTT 30 24 - 36 seconds    Comment: Performed at [place] [address], [place], [place] [address]  Protime-INR     Status: Abnormal   Collection Time: 06/10/19  1:45 PM  Result Value Ref Range   Prothrombin Time 17.4 (H) 11.4 - 15.2 seconds   INR 1.4 (H) 0.8 - 1.2    Comment: (NOTE) INR goal varies based on device and disease states. Performed at [facility], [place] [address] [place], [place] [address]   POC SARS Coronavirus 2 Ag-ED - Nasal Swab (BD Veritor Kit)     Status: None   Collection Time: 06/10/19  2:51 PM  Result Value Ref Range   SARS Coronavirus 2 Ag NEGATIVE NEGATIVE    Comment: (NOTE) SARS-CoV-2 antigen NOT DETECTED.  Negative results are presumptive.  Negative results do not preclude SARS-CoV-2 infection and should not be used as the sole basis for treatment or other patient management decisions, including infection  control decisions, particularly in the presence of clinical signs and  symptoms consistent with COVID-19, or in those who have been in contact with the virus.  Negative results must be combined with clinical observations, patient history, and epidemiological information. The  expected result is Negative. Fact Sheet for Patients: PodPark.tn Fact Sheet for Healthcare Providers: GiftContent.is This test is not yet approved or cleared by the [place] FDA and  has been authorized for detection and/or diagnosis of SARS-CoV-2 by FDA under an Emergency Use Authorization (EUA).  This EUA will remain in effect (meaning this test can be used) for the duration of  the COVID-19 de claration under Section 564(b)(1) of the Act, 21 U.S.C. section 360bbb-3(b)(1), unless the authorization is terminated or revoked sooner.   CBG monitoring, ED     Status: Abnormal   Collection Time: 06/10/19  6:26 PM  Result Value Ref Range   Glucose-Capillary 266 (H) 70 - 99 mg/dL  SARS CORONAVIRUS 2 (TAT 6-24 HRS) Nasopharyngeal Nasopharyngeal Swab     Status: None   Collection Time: 06/10/19  6:40 PM   Specimen: Nasopharyngeal Swab  Result Value Ref Range   SARS Coronavirus 2 NEGATIVE NEGATIVE    Comment: (NOTE) SARS-CoV-2 target nucleic acids are NOT DETECTED. The SARS-CoV-2 RNA is generally detectable in upper and lower respiratory specimens during the acute phase of infection. Negative results do not preclude SARS-CoV-2 infection, do not rule out co-infections with other pathogens, and should not be used as the sole basis for treatment or other patient management decisions. Negative results  must be combined with clinical observations, patient history, and epidemiological information. The expected result is Negative. Fact Sheet for Patients: SugarRoll.be Fact Sheet for Healthcare Providers: https://www.woods-mathews.com/ This test is not yet approved or cleared by the [place] FDA and  has been authorized for detection and/or diagnosis of SARS-CoV-2 by FDA under an Emergency Use Authorization (EUA). This EUA will remain  in effect (meaning this test can be used) for the  duration of the COVID-19 declaration under Section 56 4(b)(1) of the Act, 21 U.S.C. section 360bbb-3(b)(1), unless the authorization is terminated or revoked sooner. Performed at [facility], [place] [address], [place], [place] [address]   CBG monitoring, ED     Status: Abnormal   Collection Time: 06/10/19  9:39 PM  Result Value Ref Range   Glucose-Capillary 241 (H) 70 - 99 mg/dL   Ct Angio Chest Pe W And/or Wo Contrast  Result Date: 06/10/2019 CLINICAL DATA:  Chest pain. EXAM: CT ANGIOGRAPHY CHEST WITH CONTRAST TECHNIQUE: Multidetector CT imaging of the chest was performed using the standard protocol during bolus administration of intravenous contrast. Multiplanar CT image reconstructions and MIPs were obtained to evaluate the vascular anatomy. CONTRAST:  35m OMNIPAQUE IOHEXOL 350 MG/ML SOLN COMPARISON:  None. FINDINGS: Cardiovascular: There does not appear to be any definite evidence of large central pulmonary embolus in the main pulmonary artery or main portions of the left or right pulmonary arteries. However, there is limited opacification of the more peripheral branches, and therefore smaller peripheral pulmonary emboli cannot be excluded on the basis of this exam. There is no evidence of thoracic aortic dissection or aneurysm. Normal cardiac size. No pericardial effusion. Mediastinum/Nodes: No enlarged mediastinal, hilar, or axillary lymph nodes. Thyroid gland, trachea, and esophagus demonstrate no significant findings. Lungs/Pleura: Lungs are clear. No pleural effusion or pneumothorax. Upper Abdomen: Mild splenomegaly is noted. Possible nodularity of liver is noted suggesting possible hepatic cirrhosis. Musculoskeletal: No chest wall abnormality. No acute or significant osseous findings. Review of the MIP images confirms the above findings. IMPRESSION: 1. There is no definite evidence of large central pulmonary embolus in the main pulmonary artery or main portions of the left or right  pulmonary arteries. However, there is limited opacification of the more peripheral branches, and therefore smaller peripheral pulmonary emboli cannot be excluded on the basis of this exam. 2. Mild splenomegaly is noted. Possible nodularity of liver is noted suggesting possible hepatic cirrhosis. Electronically Signed   By: JMarijo ConceptionM.D.   On: 06/10/2019 16:19   Ct Hip Left Wo Contrast  Result Date: 06/10/2019 CLINICAL DATA:  Hip swelling and infection EXAM: CT OF THE LEFT HIP WITHOUT CONTRAST TECHNIQUE: Multidetector CT imaging of the left hip was performed according to the standard protocol. Multiplanar CT image reconstructions were also generated. COMPARISON:  May 14, 2019 MRI FINDINGS: Bones/Joint/Cartilage No fracture or dislocation. There is moderate bilateral hip osteoarthritis with superior joint space loss and marginal osteophyte formation. No large joint effusion is seen. Ligaments Suboptimally assessed by CT. Muscles and Tendons Within the gluteal musculature there is enlargement and heterogeneous appearance to the gluteal musculature. No definite loculated fluid collection is seen, however limited due to technique. Soft tissues Overlying subcutaneous edema and soft tissue stranding which extends over the greater trochanter. IMPRESSION: Heterogeneous appearance with enlargement of the gluteal musculature with surrounding soft tissue inflammatory changes and skin thickening. No definite loculated fluid collection. This could be due to resolving hematoma or myositis. If further evaluation is required, would recommend  MRI. Electronically Signed   By: Prudencio Pair M.D.   On: 06/10/2019 21:13    Pending Labs Unresulted Labs (From admission, onward)    Start     Ordered   06/11/19 0500  CBC  Tomorrow morning,   R     06/10/19 2045   06/11/19 0500  Comprehensive metabolic panel  Tomorrow morning,   R     06/10/19 1740   06/10/19 2046  CBC  (heparin)  Once,   STAT    Comments: Baseline  for heparin therapy IF NOT ALREADY DRAWN.  Notify MD if PLT < 100 K.    06/10/19 2045   06/10/19 2046  Creatinine, serum  (heparin)  Once,   STAT    Comments: Baseline for heparin therapy IF NOT ALREADY DRAWN.    06/10/19 2045   06/10/19 1727  Hepatitis C antibody  Add-on,   AD     06/10/19 1726   06/10/19 1240  Urine culture  ONCE - STAT,   STAT    Question:  Patient immune status  Answer:  Normal   06/10/19 1239   06/10/19 1236  Blood culture (routine x 2)  BLOOD CULTURE X 2,   STAT     06/10/19 1235          Vitals/Pain Today's Vitals   06/10/19 1800 06/10/19 2034 06/10/19 2115 06/10/19 2215  BP: 104/66   (!) 106/58  Pulse: 87  (!) 114 (!) 115  Resp: 16  (!) 22 18  Temp:      TempSrc:      SpO2: 100%  98% 97%  Weight:      Height:      PainSc:  10-Worst pain ever      Isolation Precautions Airborne and Contact precautions  Medications Medications  vancomycin (VANCOCIN) 1,750 mg in sodium chloride 0.9 % 500 mL IVPB (has no administration in time range)  cefTRIAXone (ROCEPHIN) 1 g in sodium chloride 0.9 % 100 mL IVPB (0 g Intravenous Stopped 06/10/19 1839)  0.9 %  sodium chloride infusion ( Intravenous Not Given 06/10/19 1742)  insulin glargine (LANTUS) injection 25 Units (25 Units Subcutaneous Given 06/10/19 1837)  insulin aspart (novoLOG) injection 8 Units (8 Units Subcutaneous Given 06/10/19 1831)  cholecalciferol (VITAMIN D3) tablet 2,000 Units (has no administration in time range)  vitamin C (ASCORBIC ACID) tablet 1,000 mg (1,000 mg Oral Given 06/10/19 2153)  heparin injection 5,000 Units (5,000 Units Subcutaneous Given 06/10/19 2154)  0.9 %  sodium chloride infusion ( Intravenous New Bag/Given 06/10/19 1747)  insulin aspart (novoLOG) injection 0-15 Units (8 Units Subcutaneous Given 06/10/19 1835)  insulin aspart (novoLOG) injection 0-5 Units (2 Units Subcutaneous Given 06/10/19 2155)  sodium chloride 0.9 % bolus 1,000 mL (0 mLs Intravenous Stopped 06/10/19 1342)   ceFEPIme (MAXIPIME) 2 g in sodium chloride 0.9 % 100 mL IVPB (0 g Intravenous Stopped 06/10/19 1411)  vancomycin (VANCOCIN) 2,500 mg in sodium chloride 0.9 % 500 mL IVPB (0 mg Intravenous Stopped 06/10/19 1635)  morphine 4 MG/ML injection 4 mg (4 mg Intravenous Given 06/10/19 1358)  ondansetron (ZOFRAN) injection 4 mg (4 mg Intravenous Given 06/10/19 1358)  sodium chloride 0.9 % bolus 1,000 mL (0 mLs Intravenous Stopped 06/10/19 1700)  iohexol (OMNIPAQUE) 350 MG/ML injection 80 mL (80 mLs Intravenous Contrast Given 06/10/19 1605)  iohexol (OMNIPAQUE) 300 MG/ML solution 100 mL (100 mLs Intravenous Contrast Given 06/10/19 1936)  HYDROcodone-acetaminophen (NORCO/VICODIN) 5-325 MG per tablet 1 tablet (1 tablet Oral Given 06/10/19 2153)  Mobility walks with person assist Moderate fall risk   Focused Assessments Cardiac Assessment Handoff:  Cardiac Rhythm: Sinus tachycardia Lab Results  Component Value Date   CKTOTAL 23 (L) 06/10/2019   Lab Results  Component Value Date   DDIMER 3.04 (H) 06/10/2019   Does the Patient currently have chest pain? No     R Recommendations: See Admitting Provider Note  Report given to:   Additional Notes:

## 2019-06-12 ENCOUNTER — Inpatient Hospital Stay (HOSPITAL_COMMUNITY): Payer: Self-pay

## 2019-06-12 DIAGNOSIS — Z9119 Patient's noncompliance with other medical treatment and regimen: Secondary | ICD-10-CM

## 2019-06-12 DIAGNOSIS — W19XXXA Unspecified fall, initial encounter: Secondary | ICD-10-CM

## 2019-06-12 DIAGNOSIS — R531 Weakness: Secondary | ICD-10-CM

## 2019-06-12 DIAGNOSIS — R454 Irritability and anger: Secondary | ICD-10-CM

## 2019-06-12 DIAGNOSIS — S300XXA Contusion of lower back and pelvis, initial encounter: Secondary | ICD-10-CM

## 2019-06-12 DIAGNOSIS — D72829 Elevated white blood cell count, unspecified: Secondary | ICD-10-CM

## 2019-06-12 DIAGNOSIS — K5900 Constipation, unspecified: Secondary | ICD-10-CM

## 2019-06-12 DIAGNOSIS — R7881 Bacteremia: Secondary | ICD-10-CM

## 2019-06-12 LAB — GLUCOSE, CAPILLARY
Glucose-Capillary: 194 mg/dL — ABNORMAL HIGH (ref 70–99)
Glucose-Capillary: 198 mg/dL — ABNORMAL HIGH (ref 70–99)
Glucose-Capillary: 163 mg/dL — ABNORMAL HIGH (ref 70–99)
Glucose-Capillary: 159 mg/dL — ABNORMAL HIGH (ref 70–99)

## 2019-06-12 LAB — CBC
HCT: 35.3 % — ABNORMAL LOW (ref 39.0–52.0)
Hemoglobin: 12.1 g/dL — ABNORMAL LOW (ref 13.0–17.0)
MCH: 30.6 pg (ref 26.0–34.0)
MCHC: 34.3 g/dL (ref 30.0–36.0)
MCV: 89.4 fL (ref 80.0–100.0)
Platelets: 150 10*3/uL (ref 150–400)
RBC: 3.95 MIL/uL — ABNORMAL LOW (ref 4.22–5.81)
RDW: 14.1 % (ref 11.5–15.5)
WBC: 16.6 10*3/uL — ABNORMAL HIGH (ref 4.0–10.5)
nRBC: 0 % (ref 0.0–0.2)

## 2019-06-12 LAB — COMPREHENSIVE METABOLIC PANEL
ALT: 42 U/L (ref 0–44)
AST: 83 U/L — ABNORMAL HIGH (ref 15–41)
Albumin: 1.4 g/dL — ABNORMAL LOW (ref 3.5–5.0)
Alkaline Phosphatase: 238 U/L — ABNORMAL HIGH (ref 38–126)
Anion gap: 8 (ref 5–15)
BUN: 24 mg/dL — ABNORMAL HIGH (ref 6–20)
CO2: 21 mmol/L — ABNORMAL LOW (ref 22–32)
Calcium: 8 mg/dL — ABNORMAL LOW (ref 8.9–10.3)
Chloride: 101 mmol/L (ref 98–111)
Creatinine, Ser: 0.9 mg/dL (ref 0.61–1.24)
GFR calc Af Amer: >60 mL/min (ref >60)
GFR calc non Af Amer: >60 mL/min (ref >60)
Glucose, Bld: 201 mg/dL — ABNORMAL HIGH (ref 70–99)
Potassium: 4.6 mmol/L (ref 3.5–5.1)
Sodium: 130 mmol/L — ABNORMAL LOW (ref 135–145)
Total Bilirubin: 1.2 mg/dL (ref 0.3–1.2)
Total Protein: 6.2 g/dL — ABNORMAL LOW (ref 6.5–8.1)

## 2019-06-12 LAB — URINE CULTURE: Culture: >=100000 — AB

## 2019-06-12 LAB — ECHOCARDIOGRAM COMPLETE
Height: 75 in
Weight: 5626.14 oz

## 2019-06-12 LAB — CK: Total CK: 15 U/L — ABNORMAL LOW (ref 49–397)

## 2019-06-12 MED ORDER — SODIUM CHLORIDE 0.9 % IV BOLUS
500.0000 mL | Freq: Once | INTRAVENOUS | Status: AC
  Administered 2019-06-12: 500 mL via INTRAVENOUS

## 2019-06-12 MED ORDER — HYDROMORPHONE HCL 1 MG/ML IJ SOLN
0.5000 mg | INTRAMUSCULAR | Status: DC | PRN
  Administered 2019-06-12 – 2019-06-23 (×28): 0.5 mg via INTRAVENOUS
  Filled 2019-06-12 (×28): qty 1

## 2019-06-12 MED ORDER — METOPROLOL TARTRATE 5 MG/5ML IV SOLN
5.0000 mg | INTRAVENOUS | Status: DC | PRN
  Administered 2019-06-15: 5 mg via INTRAVENOUS
  Filled 2019-06-12 (×2): qty 5

## 2019-06-12 MED ORDER — ACETAMINOPHEN 325 MG PO TABS
650.0000 mg | ORAL_TABLET | Freq: Four times a day (QID) | ORAL | Status: DC | PRN
  Administered 2019-06-12: 650 mg via ORAL
  Filled 2019-06-12 (×2): qty 2

## 2019-06-12 MED ORDER — ACETAMINOPHEN 325 MG PO TABS
650.0000 mg | ORAL_TABLET | Freq: Four times a day (QID) | ORAL | Status: DC | PRN
  Administered 2019-06-12 – 2019-06-27 (×9): 650 mg via ORAL
  Filled 2019-06-12 (×9): qty 2

## 2019-06-12 NOTE — Progress Notes (Signed)
[facility]   Reason for visit: Follow up on MSSA bacteremia  Antibiotics: Cefazolin, day 2 + 1 day of vanc/ceftriaxone  Interval History: The patient refused ambulation with PT and nursing staff today. He continues to c/o LT hip/gluteal pain inadequately controlled with narcotics. He states he has not had any BMs since admission. Bedside commode unable to be secured for pt due to his high weight. He refuses bedpan as this would require turning which he states is too painful. Fever curve, WBC & Cr trends, imaging, cx results, and ABX usage all independently reviewed    Current Facility-Administered Medications:  .  acetaminophen (TYLENOL) tablet 650 mg, 650 mg, Oral, Q6H PRN, Verlee Monte, MD, 650 mg at 06/12/19 0611 .  ceFAZolin (ANCEF) IVPB 2g/100 mL premix, 2 g, Intravenous, Q8H, Donne Hazel, MD, Last Rate: 200 mL/hr at 06/12/19 1447, 2 g at 06/12/19 1447 .  cholecalciferol (VITAMIN D3) tablet 2,000 Units, 2,000 Units, Oral, Daily, Domenic Polite, MD, 2,000 Units at 06/12/19 0926 .  heparin injection 5,000 Units, 5,000 Units, Subcutaneous, Q8H, Domenic Polite, MD, 5,000 Units at 06/12/19 1449 .  HYDROmorphone (DILAUDID) injection 0.5 mg, 0.5 mg, Intravenous, Q4H PRN, Donne Hazel, MD, 0.5 mg at 06/12/19 2440 .  insulin aspart (novoLOG) injection 0-15 Units, 0-15 Units, Subcutaneous, TID WC, Domenic Polite, MD, 3 Units at 06/12/19 1212 .  insulin aspart (novoLOG) injection 0-5 Units, 0-5 Units, Subcutaneous, QHS, Domenic Polite, MD, 2 Units at 06/11/19 2206 .  insulin aspart (novoLOG) injection 8 Units, 8 Units, Subcutaneous, TID WC, Domenic Polite, MD, 8 Units at 06/12/19 1212 .  insulin glargine (LANTUS) injection 25 Units, 25 Units, Subcutaneous, Daily, Domenic Polite, MD, 25 Units at 06/11/19 2206 .  ketorolac (TORADOL) 30 MG/ML injection 30 mg, 30 mg, Intravenous, Q6H PRN, Donne Hazel, MD, 30 mg at 06/12/19 0548 .  multivitamin with  minerals tablet 1 tablet, 1 tablet, Oral, Daily, Donne Hazel, MD, 1 tablet at 06/12/19 [phone] .  protein supplement (ENSURE MAX) liquid, 11 oz, Oral, Daily, Donne Hazel, MD, 11 oz at 06/12/19 0928 .  vitamin C (ASCORBIC ACID) tablet 1,000 mg, 1,000 mg, Oral, Daily, Domenic Polite, MD, 1,000 mg at 06/12/19 0926   Physical Exam:   Vitals:   06/12/19 0903 06/12/19 1625  BP: 113/71 125/65  Pulse: 91 96  Resp: 18 18  Temp: 97.9 F (36.6 C) 98.4 F (36.9 C)  SpO2: 97% 98%   Physical Exam Gen: irritable, morbidly obese, moderate distress secondary to LT hip/gluteal pain, A&Ox 3 Head: NCAT, no temporal wasting evident EENT: PERRL, EOMI, MMM, adequate dentition Neck: supple, no JVD CV: NRRR, no murmurs evident but heart sounds are distant due to body habitus Pulm: CTA bilaterally, no wheeze or retractions Abd: soft, obese with large pannus, NTND, +BS (hypoactive) Extrems: 1+ non-pitting LE edema, 1+ pulses MSK: LT hip with persistent induration laterally with scant overlying erythema, +TTP along anterior hip, pain elicited with efforts for pt to actively move LT hip, LT hip with decreased ROM Skin: poor fingernail hygeine, improving LT hip erythema, adequate skin turgor Neuro: CN II-XII grossly intact, no focal neurologic deficits appreciated, gait was unable to be assessed, A&Ox 3   Review of Systems:  Review of Systems  Constitutional: Positive for malaise/fatigue. Negative for chills, fever and weight loss.  HENT: Negative for congestion, hearing loss, sinus pain and sore throat.   Eyes: Negative for blurred vision, photophobia and discharge.  Respiratory: Negative  for cough, hemoptysis and shortness of breath.   Cardiovascular: Negative for chest pain, palpitations, orthopnea and leg swelling.  Gastrointestinal: Positive for constipation. Negative for abdominal pain, diarrhea, heartburn, nausea and vomiting.  Genitourinary: Negative for dysuria, flank pain, frequency and  urgency.  Musculoskeletal: Positive for joint pain. Negative for back pain and myalgias.  Skin: Negative for itching and rash.  Neurological: Positive for weakness. Negative for tremors, seizures and headaches.  Endo/Heme/Allergies: Negative for polydipsia. Does not bruise/bleed easily.  Psychiatric/Behavioral: Negative for depression and substance abuse. The patient is not nervous/anxious and does not have insomnia.      Lab Results  Component Value Date   WBC 16.6 (H) 06/12/2019   HGB 12.1 (L) 06/12/2019   HCT 35.3 (L) 06/12/2019   MCV 89.4 06/12/2019   PLT 150 06/12/2019    Lab Results  Component Value Date   CREATININE 0.90 06/12/2019   BUN 24 (H) 06/12/2019   NA 130 (L) 06/12/2019   K 4.6 06/12/2019   CL 101 06/12/2019   CO2 21 (L) 06/12/2019    Lab Results  Component Value Date   ALT 42 06/12/2019   AST 83 (H) 06/12/2019   ALKPHOS 238 (H) 06/12/2019     Microbiology: Recent Results (from the past 240 hour(s))  Blood culture (routine x 2)     Status: Abnormal (Preliminary result)   Collection Time: 06/10/19  1:25 PM   Specimen: BLOOD  Result Value Ref Range Status   Specimen Description BLOOD LEFT ANTECUBITAL  Final   Special Requests   Final    BOTTLES DRAWN AEROBIC AND ANAEROBIC Blood Culture adequate volume   Culture  Setup Time   Final    GRAM POSITIVE COCCI IN CLUSTERS IN BOTH AEROBIC AND ANAEROBIC BOTTLES CRITICAL RESULT CALLED TO, READ BACK BY AND VERIFIED WITH: K. AMEND PHARMD, AT 0701 06/11/19 BY Rush Landmark Performed at [facility], [place] [address], [place], [place] [address]    Culture STAPHYLOCOCCUS AUREUS (A)  Final   Report Status PENDING  Incomplete  Blood culture (routine x 2)     Status: Abnormal (Preliminary result)   Collection Time: 06/10/19  1:35 PM   Specimen: BLOOD  Result Value Ref Range Status   Specimen Description BLOOD BLOOD RIGHT FOREARM  Final   Special Requests   Final    BOTTLES DRAWN AEROBIC AND ANAEROBIC Blood Culture  adequate volume   Culture  Setup Time   Final    GRAM POSITIVE COCCI AEROBIC BOTTLE ONLY CRITICAL VALUE NOTED.  VALUE IS CONSISTENT WITH PREVIOUSLY REPORTED AND CALLED VALUE.    Culture (A)  Final    STAPHYLOCOCCUS AUREUS SUSCEPTIBILITIES TO FOLLOW Performed at [facility], [place] [address], [place], [place] [address]    Report Status PENDING  Incomplete  Blood Culture ID Panel (Reflexed)     Status: Abnormal   Collection Time: 06/10/19  1:35 PM  Result Value Ref Range Status   Enterococcus species NOT DETECTED NOT DETECTED Final   Listeria monocytogenes NOT DETECTED NOT DETECTED Final   Staphylococcus species DETECTED (A) NOT DETECTED Final    Comment: CRITICAL RESULT CALLED TO, READ BACK BY AND VERIFIED WITH: Andres Shad PharmD 14:55 06/11/19 ([email or web address])    Staphylococcus aureus (BCID) DETECTED (A) NOT DETECTED Final    Comment: Methicillin (oxacillin) susceptible Staphylococcus aureus (MSSA). Preferred therapy is anti staphylococcal beta lactam antibiotic (Cefazolin or Nafcillin), unless clinically contraindicated. CRITICAL RESULT CALLED TO, READ BACK BY AND VERIFIED WITH: J. Judie Bonus  PharmD 14:55 06/11/19 ([email or web address])    Methicillin resistance NOT DETECTED NOT DETECTED Final   Streptococcus species NOT DETECTED NOT DETECTED Final   Streptococcus agalactiae NOT DETECTED NOT DETECTED Final   Streptococcus pneumoniae NOT DETECTED NOT DETECTED Final   Streptococcus pyogenes NOT DETECTED NOT DETECTED Final   Acinetobacter baumannii NOT DETECTED NOT DETECTED Final   Enterobacteriaceae species NOT DETECTED NOT DETECTED Final   Enterobacter cloacae complex NOT DETECTED NOT DETECTED Final   Escherichia coli NOT DETECTED NOT DETECTED Final   Klebsiella oxytoca NOT DETECTED NOT DETECTED Final   Klebsiella pneumoniae NOT DETECTED NOT DETECTED Final   Proteus species NOT DETECTED NOT DETECTED Final   Serratia marcescens NOT DETECTED NOT DETECTED Final   Haemophilus influenzae NOT DETECTED NOT  DETECTED Final   Neisseria meningitidis NOT DETECTED NOT DETECTED Final   Pseudomonas aeruginosa NOT DETECTED NOT DETECTED Final   Candida albicans NOT DETECTED NOT DETECTED Final   Candida glabrata NOT DETECTED NOT DETECTED Final   Candida krusei NOT DETECTED NOT DETECTED Final   Candida parapsilosis NOT DETECTED NOT DETECTED Final   Candida tropicalis NOT DETECTED NOT DETECTED Final    Comment: Performed at [facility], [address], [place], [place] [address]  Urine culture     Status: Abnormal   Collection Time: 06/10/19  1:45 PM   Specimen: Urine, Clean Catch  Result Value Ref Range Status   Specimen Description URINE, CLEAN CATCH  Final   Special Requests   Final    URINE, RANDOM Performed at [facility], [place] [address], [place],  [address]    Culture >=100,000 COLONIES/mL STAPHYLOCOCCUS AUREUS (A)  Final   Report Status 06/12/2019 FINAL  Final   Organism ID, Bacteria STAPHYLOCOCCUS AUREUS (A)  Final      Susceptibility   Staphylococcus aureus - MIC*    CIPROFLOXACIN <=0.5 SENSITIVE Sensitive     GENTAMICIN <=0.5 SENSITIVE Sensitive     NITROFURANTOIN 32 SENSITIVE Sensitive     OXACILLIN 0.5 SENSITIVE Sensitive     TETRACYCLINE >=16 RESISTANT Resistant     VANCOMYCIN 1 SENSITIVE Sensitive     TRIMETH/SULFA <=10 SENSITIVE Sensitive     CLINDAMYCIN <=0.25 SENSITIVE Sensitive     RIFAMPIN <=0.5 SENSITIVE Sensitive     Inducible Clindamycin NEGATIVE Sensitive     * >=100,000 COLONIES/mL STAPHYLOCOCCUS AUREUS  SARS CORONAVIRUS 2 (TAT 6-24 HRS) Nasopharyngeal Nasopharyngeal Swab     Status: None   Collection Time: 06/10/19  6:40 PM   Specimen: Nasopharyngeal Swab  Result Value Ref Range Status   SARS Coronavirus 2 NEGATIVE NEGATIVE Final    Comment: (NOTE) SARS-CoV-2 target nucleic acids are NOT DETECTED. The SARS-CoV-2 RNA is generally detectable in upper and lower respiratory specimens during the acute phase of infection. Negative results do not  preclude SARS-CoV-2 infection, do not rule out co-infections with other pathogens, and should not be used as the sole basis for treatment or other patient management decisions. Negative results must be combined with clinical observations, patient history, and epidemiological information. The expected result is Negative. Fact Sheet for Patients: SugarRoll.be Fact Sheet for Healthcare Providers: https://www.woods-mathews.com/ This test is not yet approved or cleared by the [place] FDA and  has been authorized for detection and/or diagnosis of SARS-CoV-2 by FDA under an Emergency Use Authorization (EUA). This EUA will remain  in effect (meaning this test can be used) for the duration of the COVID-19 declaration under Section 56 4(b)(1) of the Act, 21 U.S.C.  section 360bbb-3(b)(1), unless the authorization is terminated or revoked sooner. Performed at [facility], [place] [address] [place], [place] [address]   Culture, blood (routine x 2)     Status: None (Preliminary result)   Collection Time: 06/11/19  4:35 PM   Specimen: BLOOD LEFT HAND  Result Value Ref Range Status   Specimen Description BLOOD LEFT HAND  Final   Special Requests   Final    BOTTLES DRAWN AEROBIC AND ANAEROBIC Blood Culture adequate volume   Culture   Final    NO GROWTH < 24 HOURS Performed at [facility], [place] [address], [place], [place] [address]    Report Status PENDING  Incomplete  Culture, blood (routine x 2)     Status: None (Preliminary result)   Collection Time: 06/11/19  4:40 PM   Specimen: BLOOD RIGHT HAND  Result Value Ref Range Status   Specimen Description BLOOD RIGHT HAND  Final   Special Requests   Final    BOTTLES DRAWN AEROBIC ONLY Blood Culture adequate volume   Culture   Final    NO GROWTH < 24 HOURS Performed at [facility], [place] [address], [place], [place] [address]    Report Status PENDING  Incomplete     Impression/Plan: Patient is a 53 year old morbidly obese white male with poorly controlled diabetes and multiple recent falls resulting in a recent left gluteal hematoma presenting with MSSA bacteremia, left hip myositis/pain, fever, and leukocytosis.  1.  MSSA bacteremia -we will continue cefazolin 2 g IV every 8 hours as both of the patient's initial blood cultures were positive for MSSA.Marland Kitchen  The most likely source of his bacteremia would be his left hip hematoma/myositis following his recent falls.  His transthoracic echocardiogram today showed no evidence of vegetation or significant regurgitant valve but the study was difficult due to the patient's large body habitus.  Repeat blood cultures thus far show no growth to date.  Would proceed forward with a transesophageal echocardiogram to best exclude endocarditis given the subacute presentation over the past month.  2.  Left hip myositis -the patient's recent CK values have remained within normal limits on both the past 2 days at 23 and 15 respectively.  His most recent CT imaging however, is suggestive of advanced myositis to his left hip.  Given the patient's continued ongoing complaints of pain refractory to narcotics, I would have the orthopedic service evaluate the patient for possible septic arthritis, particularly in the setting of his active bacteremia with a pathogen that is known to be high risk for metastatic spread.  The patient has been unable to tolerate ambulation or any weightbearing since his admission thus far and may need an aspiration to his left hip to exclude septic arthritis.  The patient's persistent leukocytosis despite appropriate antibiotic treatment is worrisome for protective focus of infection.  Would continue judicious use of narcotics and consider adding bowel regimen to ensure the patient does not develop a narcotic related ileus as a complication.  I urged the patient to attempt ambulation with therapy and the nursing staff as  most hematomas and even myositis will improve with increased physical activity over time.  3.  Poorly controlled diabetes -patient admits to outpatient noncompliance for the last several months as his glucometer is not even in his own home.  His glycemic control has dramatically improved over the last 24 hours.  I appreciate the hospitalist assistance with this as this will optimize the patient's infectious  outcome as well by maintaining a blood sugar of 150 or less as best able.

## 2019-06-12 NOTE — Progress Notes (Signed)
  Echocardiogram 2D Echocardiogram has been performed.  Robert Sullivan 06/12/2019, 8:52 AM

## 2019-06-12 NOTE — Progress Notes (Signed)
PROGRESS NOTE    Robert Aleman  UUV:[id number] DOB: 1966/03/18 DOA: 06/10/2019 PCP: Cathleen Corti, PA-C    Brief Narrative:  53 y.o. male with history of uncontrolled type 2 diabetes mellitus, morbid obesity was admitted to [facility] from 11/2-11/7 with E. coli UTI, uncontrolled diabetes and a fall that resulted in a left gluteal hematoma, he was treated with IV antibiotics, discharged home on Bactrim, completed course and also on insulin. -He felt well initially after discharge, in the last 7 to 10 days he started having fevers and chills, profuse sweats, 5 to 6 days ago he started having increased pain and swelling at the site of his gluteal hematoma, he also noticed that his urine was darker but denied any dysuria or change in odor presented to the emergency room today with weakness, worsening left hip pain, fevers chills and sweats. -In the ED he was noted to have a sodium of 131, CBG of 380, albumin of 1.8, ALP of 224,  lactic acid of 5.5, white count of 20,000.  In the ED he was noted to have an elevated D-dimer subsequently EDP proceeded with CT angiogram which was negative for pulmonary embolism, his UA was significantly abnormal with cloudy urine, positive nitrite, leukocyte esterase, numerous WBC and bacteria.  In addition noted to have redness and tenderness of his left gluteal region  Assessment & Plan:   Active Problems:   Acute lower UTI   Sepsis ([place])   Uncontrolled diabetes mellitus ([place])   Obesity, Class III, BMI 40-49.9 (morbid obesity) (HCC)   Cellulitis, gluteal, left  Sepsis secondary to MSSA bacteremia -Blood cx pos for MSSA, ID now consulted with recommendation for 2d echo to r/o endocarditis. If neg, will likely need follow up TEE -currently on ancef -CT reviewed, no drainable abscess identified in L hip. Have ordered follow up MRI L hip, pending -Lactate improving  Urinary tract infection -Continued on ancef -Presenting UA suggestive of UTI, however, pt denies  dysuria or other symptoms  Left gluteal hematoma with cellulitis, possible abscess -Per above, pt now on ancef  Morbid obesity -BMI of 47 -diet/lifestyle modification recommended  Uncontrolled type 2 diabetes mellitus -Hemoglobin A1c was 11 recently -Resume Lantus and NovoLog premeal and sliding scale insulin -Remains stable at this time  Suspected cirrhosis -Noted on imaging, patient denies any alcohol use currently but did drink regularly in the past, suspect NASH/fatty liver disease is likely the primary culprit -Hep C neg -GI follow up recommended  DVT prophylaxis: Heparin subQ Code Status: Full Family Communication: Pt in room, family not at bedside Disposition Plan: Uncertain at this time  Consultants:   ID  Procedures:     Antimicrobials: Anti-infectives (From admission, onward)   Start     Dose/Rate Route Frequency Ordered Stop   06/11/19 2200  ceFAZolin (ANCEF) IVPB 2g/100 mL premix     2 g 200 mL/hr over 30 Minutes Intravenous Every 8 hours 06/11/19 1456     06/11/19 0200  vancomycin (VANCOCIN) 1,750 mg in sodium chloride 0.9 % 500 mL IVPB  Status:  Discontinued     1,750 mg 250 mL/hr over 120 Minutes Intravenous Every 12 hours 06/10/19 1257 06/11/19 1456   06/10/19 2200  ceFEPIme (MAXIPIME) 2 g in sodium chloride 0.9 % 100 mL IVPB  Status:  Discontinued     2 g 200 mL/hr over 30 Minutes Intravenous Every 8 hours 06/10/19 1257 06/10/19 1740   06/10/19 1745  cefTRIAXone (ROCEPHIN) 1 g in sodium chloride 0.9 % 100  mL IVPB  Status:  Discontinued     1 g 200 mL/hr over 30 Minutes Intravenous Every 24 hours 06/10/19 1740 06/11/19 1456   06/10/19 1245  ceFEPIme (MAXIPIME) 2 g in sodium chloride 0.9 % 100 mL IVPB     2 g 200 mL/hr over 30 Minutes Intravenous  Once 06/10/19 1239 06/10/19 1411   06/10/19 1245  vancomycin (VANCOCIN) IVPB 1000 mg/200 mL premix  Status:  Discontinued     1,000 mg 200 mL/hr over 60 Minutes Intravenous  Once 06/10/19 1239 06/10/19  1241   06/10/19 1245  vancomycin (VANCOCIN) 2,500 mg in sodium chloride 0.9 % 500 mL IVPB     2,500 mg 250 mL/hr over 120 Minutes Intravenous  Once 06/10/19 1241 06/10/19 1635      Subjective: Still complaining of marked   Objective: Vitals:   06/11/19 2134 06/12/19 0500 06/12/19 0903 06/12/19 1625  BP: 136/68 132/64 113/71 125/65  Pulse: (!) 108 (!) 106 91 96  Resp: 18 18 18 18   Temp: 99.3 F (37.4 C) 99 F (37.2 C) 97.9 F (36.6 C) 98.4 F (36.9 C)  TempSrc: Oral Oral Oral Oral  SpO2: 98% 99% 97% 98%  Weight: (!) 159.5 kg     Height:        Intake/Output Summary (Last 24 hours) at 06/12/2019 1712 Last data filed at 06/12/2019 1300 Gross per 24 hour  Intake 1160 ml  Output 700 ml  Net 460 ml   Filed Weights   06/11/19 0301 06/11/19 0607 06/11/19 2134  Weight: (!) 166.1 kg (!) 166.1 kg (!) 159.5 kg    Examination: General exam: Awake, laying in bed, in nad Respiratory system: Normal respiratory effort, no wheezing Cardiovascular system: regular rate, s1, s2 Gastrointestinal system: Soft, nondistended, positive BS Central nervous system: CN2-12 grossly intact, strength intact Extremities: Perfused, no clubbing, marked L hip pain and induration Skin: Normal skin turgor, no notable skin lesions seen Psychiatry: Mood normal // no visual hallucinations   Data Reviewed: I have personally reviewed following labs and imaging studies  CBC: Recent Labs  Lab 06/10/19 1129 06/11/19 0721 06/12/19 0318  WBC 20.4* 16.5* 16.6*  NEUTROABS 17.6*  --   --   HGB 13.6 11.5* 12.1*  HCT 41.0 34.6* 35.3*  MCV 92.3 91.5 89.4  PLT 198 150 315   Basic Metabolic Panel: Recent Labs  Lab 06/10/19 1129 06/11/19 0721 06/12/19 0318  NA 131* 133* 130*  K 4.8 5.1 4.6  CL 95* 102 101  CO2 14* 20* 21*  GLUCOSE 380* 196* 201*  BUN 16 19 24*  CREATININE 1.09 0.87 0.90  CALCIUM 8.6* 7.8* 8.0*   GFR: Estimated Creatinine Clearance: 155.5 mL/min (by C-G formula based on SCr of 0.9  mg/dL). Liver Function Tests: Recent Labs  Lab 06/10/19 1129 06/11/19 0721 06/12/19 0318  AST 72* 67* 83*  ALT 44 39 42  ALKPHOS 224* 184* 238*  BILITOT 1.7* 1.5* 1.2  PROT 7.4 5.8* 6.2*  ALBUMIN 1.8* 1.4* 1.4*   No results for input(s): LIPASE, AMYLASE in the last 168 hours. No results for input(s): AMMONIA in the last 168 hours. Coagulation Profile: Recent Labs  Lab 06/10/19 1345  INR 1.4*   Cardiac Enzymes: Recent Labs  Lab 06/10/19 1129 06/12/19 0318  CKTOTAL 23* 15*   BNP (last 3 results) No results for input(s): PROBNP in the last 8760 hours. HbA1C: No results for input(s): HGBA1C in the last 72 hours. CBG: Recent Labs  Lab 06/11/19 1618 06/11/19 2133  06/12/19 0648 06/12/19 1130 06/12/19 1626  GLUCAP 174* 246* 194* 198* 163*   Lipid Profile: No results for input(s): CHOL, HDL, LDLCALC, TRIG, CHOLHDL, LDLDIRECT in the last 72 hours. Thyroid Function Tests: No results for input(s): TSH, T4TOTAL, FREET4, T3FREE, THYROIDAB in the last 72 hours. Anemia Panel: No results for input(s): VITAMINB12, FOLATE, FERRITIN, TIBC, IRON, RETICCTPCT in the last 72 hours. Sepsis Labs: Recent Labs  Lab 06/10/19 1129 06/10/19 1345  LATICACIDVEN 5.5* 2.6*    Recent Results (from the past 240 hour(s))  Blood culture (routine x 2)     Status: Abnormal (Preliminary result)   Collection Time: 06/10/19  1:25 PM   Specimen: BLOOD  Result Value Ref Range Status   Specimen Description BLOOD LEFT ANTECUBITAL  Final   Special Requests   Final    BOTTLES DRAWN AEROBIC AND ANAEROBIC Blood Culture adequate volume   Culture  Setup Time   Final    GRAM POSITIVE COCCI IN CLUSTERS IN BOTH AEROBIC AND ANAEROBIC BOTTLES CRITICAL RESULT CALLED TO, READ BACK BY AND VERIFIED WITH: K. AMEND PHARMD, AT 0701 06/11/19 BY Rush Landmark Performed at [facility], [place] [address] [place], [place] [address]    Culture STAPHYLOCOCCUS AUREUS (A)  Final   Report Status PENDING  Incomplete   Blood culture (routine x 2)     Status: Abnormal (Preliminary result)   Collection Time: 06/10/19  1:35 PM   Specimen: BLOOD  Result Value Ref Range Status   Specimen Description BLOOD BLOOD RIGHT FOREARM  Final   Special Requests   Final    BOTTLES DRAWN AEROBIC AND ANAEROBIC Blood Culture adequate volume   Culture  Setup Time   Final    GRAM POSITIVE COCCI AEROBIC BOTTLE ONLY CRITICAL VALUE NOTED.  VALUE IS CONSISTENT WITH PREVIOUSLY REPORTED AND CALLED VALUE.    Culture (A)  Final    STAPHYLOCOCCUS AUREUS SUSCEPTIBILITIES TO FOLLOW Performed at [facility], [place] [address] [place], [place] [address]    Report Status PENDING  Incomplete  Blood Culture ID Panel (Reflexed)     Status: Abnormal   Collection Time: 06/10/19  1:35 PM  Result Value Ref Range Status   Enterococcus species NOT DETECTED NOT DETECTED Final   Listeria monocytogenes NOT DETECTED NOT DETECTED Final   Staphylococcus species DETECTED (A) NOT DETECTED Final    Comment: CRITICAL RESULT CALLED TO, READ BACK BY AND VERIFIED WITH: Andres Shad PharmD 14:55 06/11/19 ([email or web address])    Staphylococcus aureus (BCID) DETECTED (A) NOT DETECTED Final    Comment: Methicillin (oxacillin) susceptible Staphylococcus aureus (MSSA). Preferred therapy is anti staphylococcal beta lactam antibiotic (Cefazolin or Nafcillin), unless clinically contraindicated. CRITICAL RESULT CALLED TO, READ BACK BY AND VERIFIED WITH: Andres Shad PharmD 14:55 06/11/19 (wilsonm)    Methicillin resistance NOT DETECTED NOT DETECTED Final   Streptococcus species NOT DETECTED NOT DETECTED Final   Streptococcus agalactiae NOT DETECTED NOT DETECTED Final   Streptococcus pneumoniae NOT DETECTED NOT DETECTED Final   Streptococcus pyogenes NOT DETECTED NOT DETECTED Final   Acinetobacter baumannii NOT DETECTED NOT DETECTED Final   Enterobacteriaceae species NOT DETECTED NOT DETECTED Final   Enterobacter cloacae complex NOT DETECTED NOT DETECTED Final   Escherichia  coli NOT DETECTED NOT DETECTED Final   Klebsiella oxytoca NOT DETECTED NOT DETECTED Final   Klebsiella pneumoniae NOT DETECTED NOT DETECTED Final   Proteus species NOT DETECTED NOT DETECTED Final   Serratia marcescens NOT DETECTED NOT DETECTED Final   Haemophilus influenzae NOT DETECTED  NOT DETECTED Final   Neisseria meningitidis NOT DETECTED NOT DETECTED Final   Pseudomonas aeruginosa NOT DETECTED NOT DETECTED Final   Candida albicans NOT DETECTED NOT DETECTED Final   Candida glabrata NOT DETECTED NOT DETECTED Final   Candida krusei NOT DETECTED NOT DETECTED Final   Candida parapsilosis NOT DETECTED NOT DETECTED Final   Candida tropicalis NOT DETECTED NOT DETECTED Final    Comment: Performed at [facility], [place] [address] [place], [place] [address]  Urine culture     Status: Abnormal   Collection Time: 06/10/19  1:45 PM   Specimen: Urine, Clean Catch  Result Value Ref Range Status   Specimen Description URINE, CLEAN CATCH  Final   Special Requests   Final    URINE, RANDOM Performed at [facility], [place] [address], [place], [place] [address]    Culture >=100,000 COLONIES/mL STAPHYLOCOCCUS AUREUS (A)  Final   Report Status 06/12/2019 FINAL  Final   Organism ID, Bacteria STAPHYLOCOCCUS AUREUS (A)  Final      Susceptibility   Staphylococcus aureus - MIC*    CIPROFLOXACIN <=0.5 SENSITIVE Sensitive     GENTAMICIN <=0.5 SENSITIVE Sensitive     NITROFURANTOIN 32 SENSITIVE Sensitive     OXACILLIN 0.5 SENSITIVE Sensitive     TETRACYCLINE >=16 RESISTANT Resistant     VANCOMYCIN 1 SENSITIVE Sensitive     TRIMETH/SULFA <=10 SENSITIVE Sensitive     CLINDAMYCIN <=0.25 SENSITIVE Sensitive     RIFAMPIN <=0.5 SENSITIVE Sensitive     Inducible Clindamycin NEGATIVE Sensitive     * >=100,000 COLONIES/mL STAPHYLOCOCCUS AUREUS  SARS CORONAVIRUS 2 (TAT 6-24 HRS) Nasopharyngeal Nasopharyngeal Swab     Status: None   Collection Time: 06/10/19  6:40 PM   Specimen: Nasopharyngeal Swab   Result Value Ref Range Status   SARS Coronavirus 2 NEGATIVE NEGATIVE Final    Comment: (NOTE) SARS-CoV-2 target nucleic acids are NOT DETECTED. The SARS-CoV-2 RNA is generally detectable in upper and lower respiratory specimens during the acute phase of infection. Negative results do not preclude SARS-CoV-2 infection, do not rule out co-infections with other pathogens, and should not be used as the sole basis for treatment or other patient management decisions. Negative results must be combined with clinical observations, patient history, and epidemiological information. The expected result is Negative. Fact Sheet for Patients: SugarRoll.be Fact Sheet for Healthcare Providers: https://www.woods-mathews.com/ This test is not yet approved or cleared by the [place] FDA and  has been authorized for detection and/or diagnosis of SARS-CoV-2 by FDA under an Emergency Use Authorization (EUA). This EUA will remain  in effect (meaning this test can be used) for the duration of the COVID-19 declaration under Section 56 4(b)(1) of the Act, 21 U.S.C. section 360bbb-3(b)(1), unless the authorization is terminated or revoked sooner. Performed at [facility], [place] [address], [place], [place] [address]   Culture, blood (routine x 2)     Status: None (Preliminary result)   Collection Time: 06/11/19  4:35 PM   Specimen: BLOOD LEFT HAND  Result Value Ref Range Status   Specimen Description BLOOD LEFT HAND  Final   Special Requests   Final    BOTTLES DRAWN AEROBIC AND ANAEROBIC Blood Culture adequate volume   Culture   Final    NO GROWTH < 24 HOURS Performed at [facility], [place] [address], [place], [place] [address]    Report Status PENDING  Incomplete  Culture, blood (routine x 2)  Status: None (Preliminary result)   Collection Time: 06/11/19  4:40 PM   Specimen: BLOOD RIGHT HAND  Result Value Ref Range Status   Specimen  Description BLOOD RIGHT HAND  Final   Special Requests   Final    BOTTLES DRAWN AEROBIC ONLY Blood Culture adequate volume   Culture   Final    NO GROWTH < 24 HOURS Performed at [facility], [address] [place], [place] [address]    Report Status PENDING  Incomplete     Radiology Studies: Ct Hip Left Wo Contrast  Result Date: 06/10/2019 CLINICAL DATA:  Hip swelling and infection EXAM: CT OF THE LEFT HIP WITHOUT CONTRAST TECHNIQUE: Multidetector CT imaging of the left hip was performed according to the standard protocol. Multiplanar CT image reconstructions were also generated. COMPARISON:  May 14, 2019 MRI FINDINGS: Bones/Joint/Cartilage No fracture or dislocation. There is moderate bilateral hip osteoarthritis with superior joint space loss and marginal osteophyte formation. No large joint effusion is seen. Ligaments Suboptimally assessed by CT. Muscles and Tendons Within the gluteal musculature there is enlargement and heterogeneous appearance to the gluteal musculature. No definite loculated fluid collection is seen, however limited due to technique. Soft tissues Overlying subcutaneous edema and soft tissue stranding which extends over the greater trochanter. IMPRESSION: Heterogeneous appearance with enlargement of the gluteal musculature with surrounding soft tissue inflammatory changes and skin thickening. No definite loculated fluid collection. This could be due to resolving hematoma or myositis. If further evaluation is required, would recommend MRI. Electronically Signed   By: Prudencio Pair M.D.   On: 06/10/2019 21:13    Scheduled Meds: . cholecalciferol  2,000 Units Oral Daily  . heparin  5,000 Units Subcutaneous Q8H  . insulin aspart  0-15 Units Subcutaneous TID WC  . insulin aspart  0-5 Units Subcutaneous QHS  . insulin aspart  8 Units Subcutaneous TID WC  . insulin glargine  25 Units Subcutaneous Daily  . multivitamin with minerals  1 tablet Oral Daily  . Ensure Max  Protein  11 oz Oral Daily  . vitamin C  1,000 mg Oral Daily   Continuous Infusions: .  ceFAZolin (ANCEF) IV 2 g (06/12/19 1447)     LOS: 2 days   Marylu Lund, MD [facility] Pager On Amion  If 7PM-7AM, please contact night-coverage 06/12/2019, 5:12 PM

## 2019-06-12 NOTE — TOC Initial Note (Signed)
Transition of Care [facility]) - Initial/Assessment Note    Patient Details  Name: Robert Sullivan MRN: [id number] Date of Birth: 1965/12/16  [facility]) CM/SW Contact:    Bartholomew Crews, RN Phone Number: [phone] 06/12/2019, 3:53 PM  Clinical Narrative:                 Spoke with patient at the bedside. PTA home with spouse and minor children. Patient familiar to NCM from previous hosptialization.   Patient had been set up with [facility] for hospital follow up 11/25, but was a no show. Discussed this patient who stated that he was not aware of when the appointment was and couldn't figure out where information was listed in his discharge instructions. Discussed new appointment, however, patient stated that he has a PCP, Brantley Stage, PA-C, system updated.   Spoke with liaison at [facility]. Patient was set up for [facility] last admission. HH was never activated d/t [place] agency unable to reach patient despite multiple attempts.   TOC following for transition needs.   Expected Discharge Plan: [place] Barriers to Discharge: Continued Medical Work up   Patient Goals and CMS Choice   CMS Medicare.gov Compare Post Acute Care list provided to:: Patient Choice offered to / list presented to : Patient  Expected Discharge Plan and Services Expected Discharge Plan: [place]   Discharge Planning Services: CM Consult Post Acute Care Choice: [place] arrangements for the past 2 months: Single Family Home                 DME Arranged: N/A DME Agency: NA                  Prior Living Arrangements/Services Living arrangements for the past 2 months: Single Family Home Lives with:: Self, Minor Children, Spouse Patient language and need for interpreter reviewed:: Yes Do you feel safe going back to the place where you live?: Yes      Need for Family Participation in Patient Care: Yes (Comment) Care giver support  system in place?: Yes (comment) Current home services: DME Criminal Activity/Legal Involvement Pertinent to Current Situation/Hospitalization: No - Comment as needed  Activities of Daily Living Home Assistive Devices/Equipment: Crutches, Eyeglasses, Walker (specify type) ADL Screening (condition at time of admission) Patient's cognitive ability adequate to safely complete daily activities?: Yes Is the patient deaf or have difficulty hearing?: No Does the patient have difficulty seeing, even when wearing glasses/contacts?: No Does the patient have difficulty concentrating, remembering, or making decisions?: No Patient able to express need for assistance with ADLs?: Yes Does the patient have difficulty dressing or bathing?: No Independently performs ADLs?: Yes (appropriate for developmental age) Does the patient have difficulty walking or climbing stairs?: Yes Weakness of Legs: Both Weakness of Arms/Hands: None  Permission Sought/Granted                  Emotional Assessment Appearance:: Appears stated age Attitude/Demeanor/Rapport: Engaged Affect (typically observed): Accepting Orientation: : Oriented to Situation, Oriented to  Time, Oriented to Place, Oriented to Self Alcohol / Substance Use: Not Applicable Psych Involvement: No (comment)  Admission diagnosis:  Sepsis, due to unspecified organism, unspecified whether acute organ dysfunction present [facility]) [A41.9] Patient Active Problem List   Diagnosis Date Noted  . Uncontrolled diabetes mellitus ([place]) 06/10/2019  . Obesity, Class III, BMI 40-49.9 (morbid obesity) ([place]) 06/10/2019  . Cellulitis, gluteal, left 06/10/2019  . Sepsis secondary to  UTI ([place]) 05/13/2019  . Acute lower UTI 05/13/2019  . Type 2 diabetes mellitus without complication ([place]) 43/92/6599  . Hip pain, acute, left 05/13/2019  . Sepsis ([place]) 05/13/2019  . Acute cystitis without hematuria   . Hyperglycemia    PCP:  Cathleen Corti, PA-C Pharmacy:    [id number]: [phone]: [phone] [facility] [place] [address]Phone: [phone]: [phone]    Social Determinants of Health (SDOH) Interventions    Readmission Risk Interventions No flowsheet data found.

## 2019-06-12 NOTE — Progress Notes (Signed)
Patient refused MRI. RN requested that we try again tomorrow.

## 2019-06-12 NOTE — Progress Notes (Signed)
Inpatient Diabetes Program Recommendations  AACE/ADA: New Consensus Statement on Inpatient Glycemic Control (2015)  Target Ranges:  Prepandial:   less than 140 mg/dL      Peak postprandial:   less than 180 mg/dL (1-2 hours)      Critically ill patients:  140 - 180 mg/dL   Lab Results  Component Value Date   GLUCAP 163 (H) 06/12/2019   HGBA1C 11.0 (H) 05/13/2019    Review of Glycemic Control  Inpatient Diabetes Program Recommendations:   Spoke with patient @ bedside concerning what insulin he was taking @ home prior to admission and discharge planning. Patient states he was taking Novolog insulin that was given to him by a friend and was using an insulin pen. Shared that he has lost 65 lbs over the past month since discharged from the hospital due to "not eating". Patient lists "lack of appetite" and "will have to go to the bathroom more the more I eat". Patient states the pain in his leg was overbearing and his sons unable to assist him out of his bed @ home due to the pain. Patient chart listed patient received Lantus and Novolog on discharge from pharmacy transitions of care, but patient states he didn't receive on discharge. Explained to patient how to take Novolin 70/30 insulin ac breakfast and dinner and difference in 70/30 insulin compared to Lantus and Novolog. Patient verbalized understanding. Spoke with case manager Manya Silvas to share information from patient. Patient has a Living Well With Diabetes Book from last admission.  Thank you, Nani Gasser. Amarys Sliwinski, RN, MSN, CDE  Diabetes Coordinator Inpatient Glycemic Control Team Team Pager [phone] (8am-5pm) 06/12/2019 4:45 PM

## 2019-06-13 ENCOUNTER — Inpatient Hospital Stay (HOSPITAL_COMMUNITY): Payer: Self-pay

## 2019-06-13 DIAGNOSIS — F419 Anxiety disorder, unspecified: Secondary | ICD-10-CM

## 2019-06-13 DIAGNOSIS — G4700 Insomnia, unspecified: Secondary | ICD-10-CM

## 2019-06-13 LAB — COMPREHENSIVE METABOLIC PANEL
ALT: 37 U/L (ref 0–44)
AST: 82 U/L — ABNORMAL HIGH (ref 15–41)
Albumin: 1.3 g/dL — ABNORMAL LOW (ref 3.5–5.0)
Alkaline Phosphatase: 272 U/L — ABNORMAL HIGH (ref 38–126)
Anion gap: 10 (ref 5–15)
BUN: 23 mg/dL — ABNORMAL HIGH (ref 6–20)
CO2: 20 mmol/L — ABNORMAL LOW (ref 22–32)
Calcium: 8.2 mg/dL — ABNORMAL LOW (ref 8.9–10.3)
Chloride: 101 mmol/L (ref 98–111)
Creatinine, Ser: 0.78 mg/dL (ref 0.61–1.24)
GFR calc Af Amer: >60 mL/min (ref >60)
GFR calc non Af Amer: >60 mL/min (ref >60)
Glucose, Bld: 173 mg/dL — ABNORMAL HIGH (ref 70–99)
Potassium: 5 mmol/L (ref 3.5–5.1)
Sodium: 131 mmol/L — ABNORMAL LOW (ref 135–145)
Total Bilirubin: 1.2 mg/dL (ref 0.3–1.2)
Total Protein: 6 g/dL — ABNORMAL LOW (ref 6.5–8.1)

## 2019-06-13 LAB — CULTURE, BLOOD (ROUTINE X 2)
Special Requests: ADEQUATE
Special Requests: ADEQUATE

## 2019-06-13 LAB — CBC
HCT: 35.4 % — ABNORMAL LOW (ref 39.0–52.0)
Hemoglobin: 12.2 g/dL — ABNORMAL LOW (ref 13.0–17.0)
MCH: 30.9 pg (ref 26.0–34.0)
MCHC: 34.5 g/dL (ref 30.0–36.0)
MCV: 89.6 fL (ref 80.0–100.0)
Platelets: 148 10*3/uL — ABNORMAL LOW (ref 150–400)
RBC: 3.95 MIL/uL — ABNORMAL LOW (ref 4.22–5.81)
RDW: 14.1 % (ref 11.5–15.5)
WBC: 18.3 10*3/uL — ABNORMAL HIGH (ref 4.0–10.5)
nRBC: 0 % (ref 0.0–0.2)

## 2019-06-13 LAB — GLUCOSE, CAPILLARY
Glucose-Capillary: 163 mg/dL — ABNORMAL HIGH (ref 70–99)
Glucose-Capillary: 154 mg/dL — ABNORMAL HIGH (ref 70–99)
Glucose-Capillary: 150 mg/dL — ABNORMAL HIGH (ref 70–99)
Glucose-Capillary: 218 mg/dL — ABNORMAL HIGH (ref 70–99)

## 2019-06-13 IMAGING — MR MR HIP*L* W/O CM
7 of 8 series · 34 of 40 positions shown · non-contrast
Comparison: CT [DATE]

CLINICAL DATA: Hip pain and swelling, question of osteomyelitis

EXAM:
MR OF THE LEFT HIP WITHOUT CONTRAST
TECHNIQUE: Multiplanar, multisequence MR imaging was performed. No intravenous
contrast was administered.

[Series 9: T1 · coronal · left · 4.0mm · 1.17mm/px · 6 of 40 slices shown]
[im 1/40]
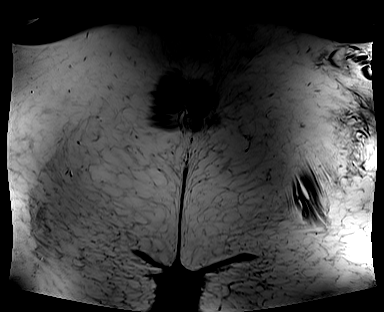
[im 8/40]
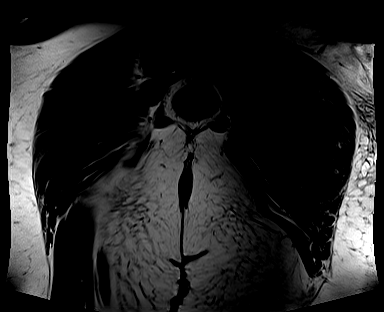
[im 16/40]
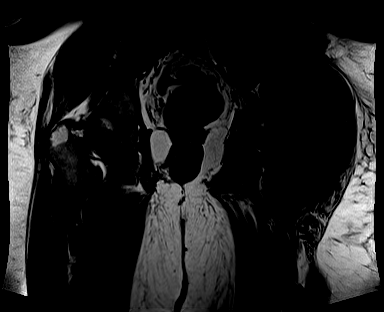
[im 24/40]
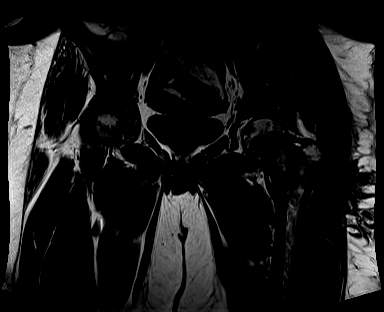
[im 32/40]
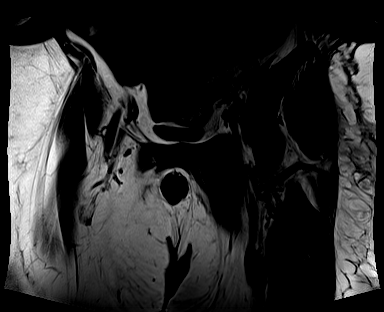
[im 40/40]
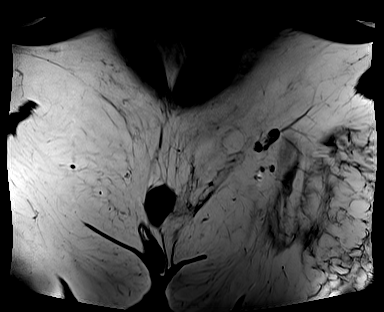

[Series 10: T2 fat-sat · coronal · left · 4.0mm · 1.00mm/px · 5 of 35 slices shown (1 of 4)]
[im 1/35]
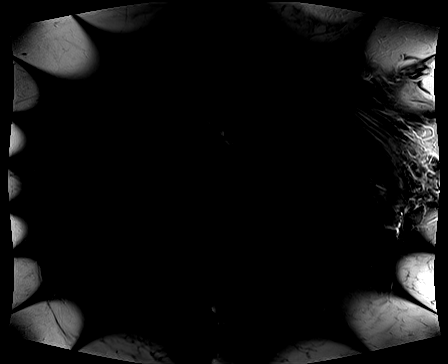
[im 9/35]
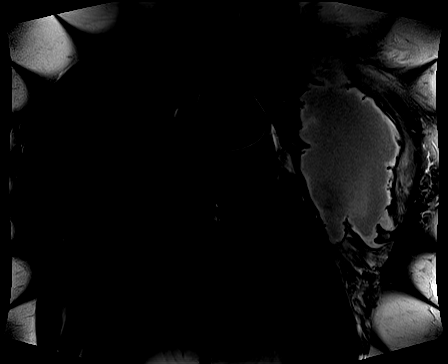
[im 18/35]
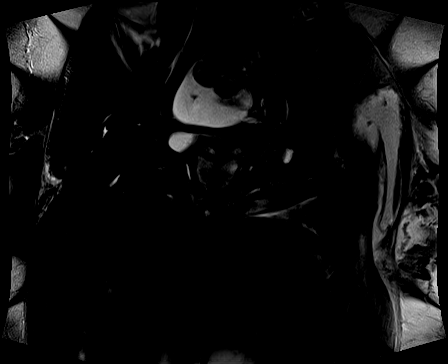
[im 26/35]
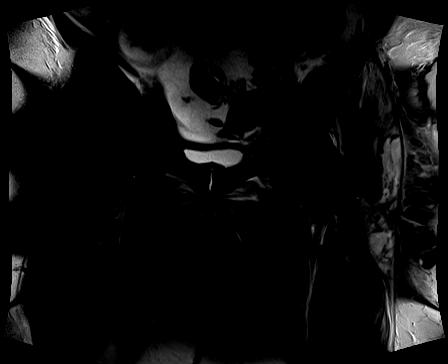
[im 35/35]
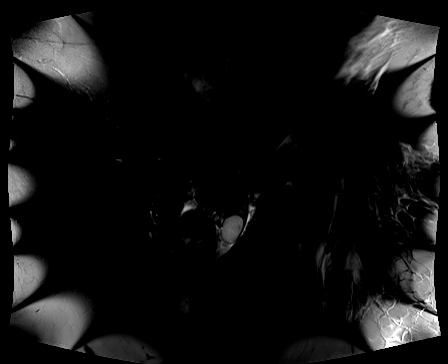

[Series 12: PD fat-sat · sagittal · left · 4.0mm · 0.56mm/px · 4 of 28 slices shown (1 of 2)]
[im 1/28]
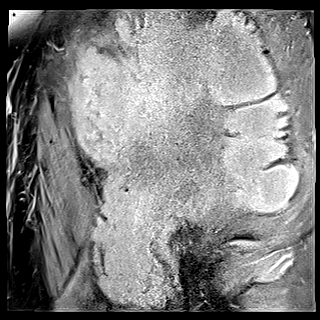
[im 10/28]
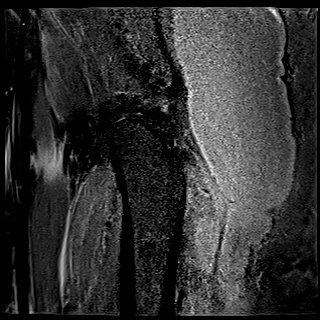
[im 19/28]
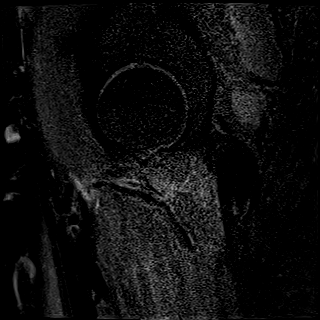
[im 28/28]
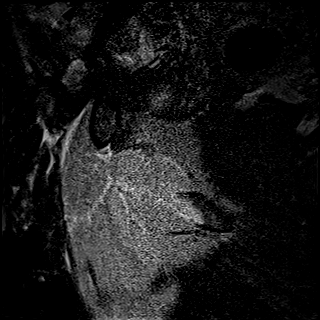

[Series 14: T2 fat-sat · axial · left · 4.0mm · 0.37mm/px · z∈[-92,+78]mm · 5 of 35 slices shown (2 of 4)]
[im 1/35]
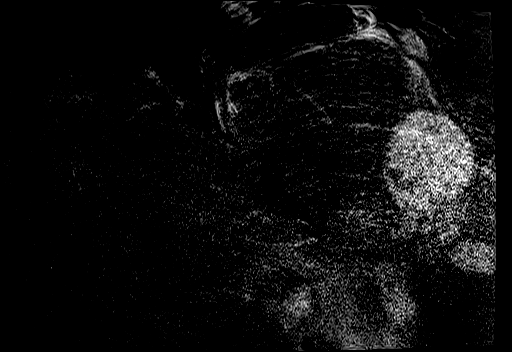
[im 9/35]
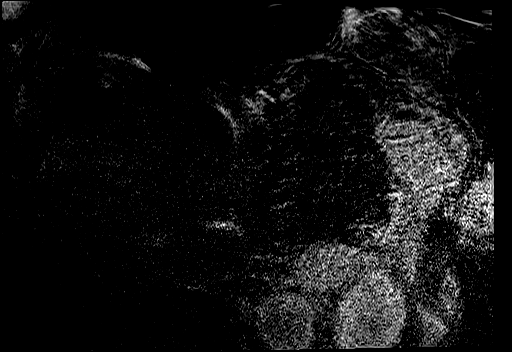
[im 18/35]
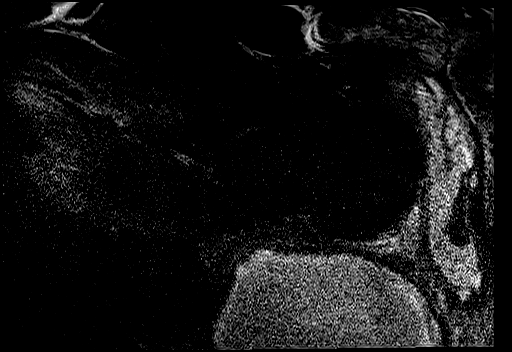
[im 26/35]
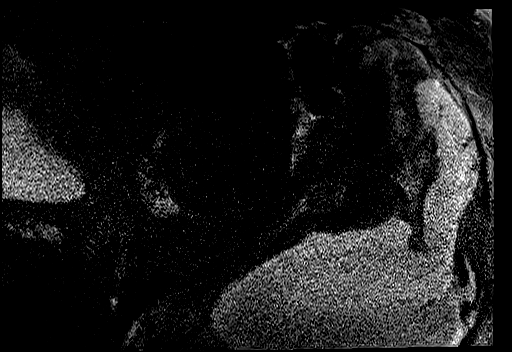
[im 35/35]
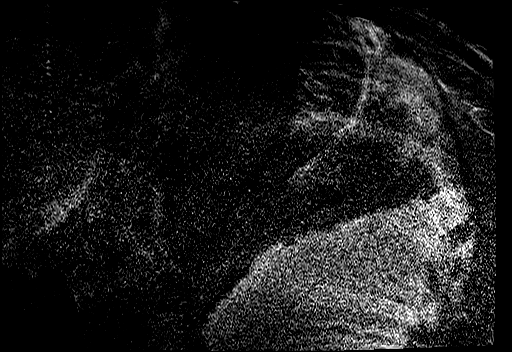

[Series 15: PD fat-sat · coronal · left · 3.0mm · 0.78mm/px · 4 of 28 slices shown (2 of 2)]
[im 1/28]
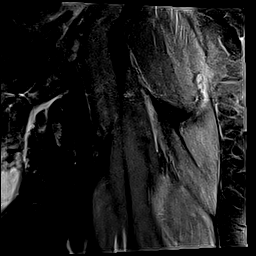
[im 10/28]
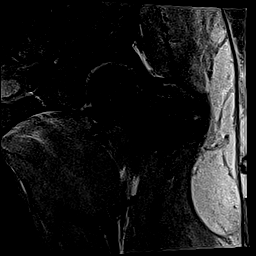
[im 19/28]
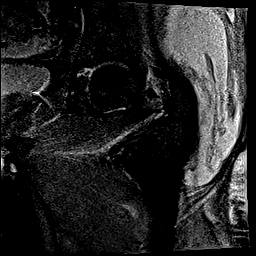
[im 28/28]
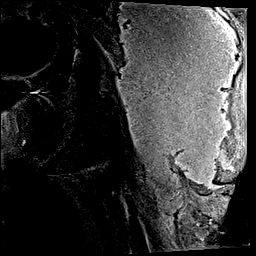

[Series 17: T2 fat-sat · coronal · left · 4.0mm · 1.12mm/px · 5 of 35 slices shown (3 of 4)]
[im 1/35]
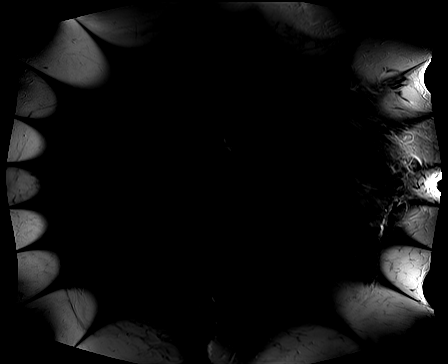
[im 9/35]
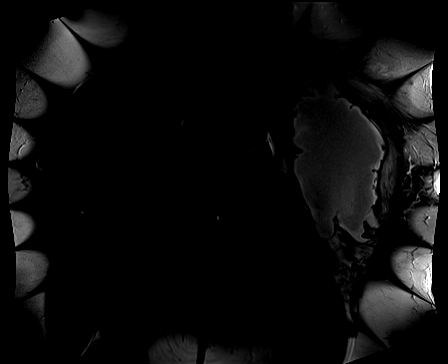
[im 18/35]
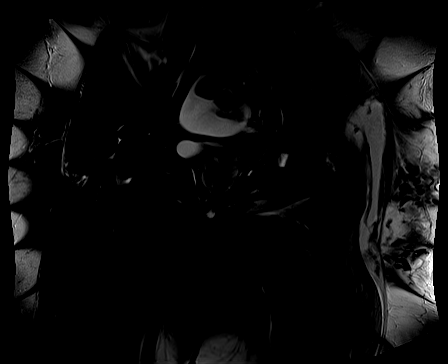
[im 26/35]
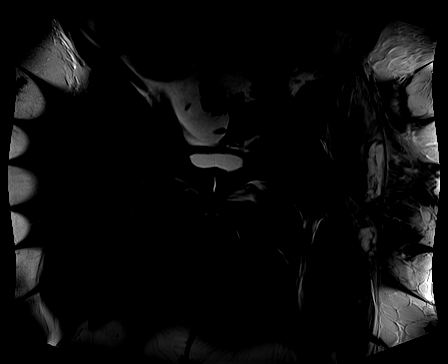
[im 35/35]
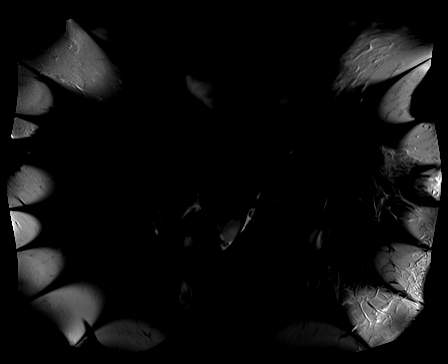

[Series 18: T2 fat-sat · axial · left · 4.0mm · 0.39mm/px · z∈[-92,+78]mm · 5 of 35 slices shown (4 of 4)]
[im 1/35]
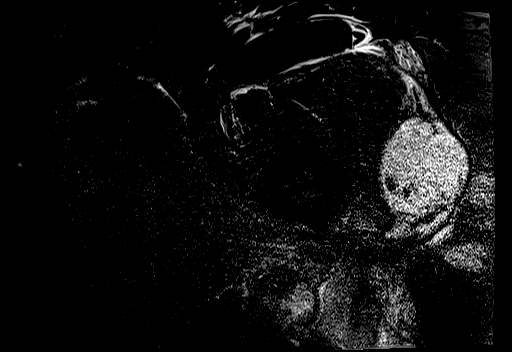
[im 9/35]
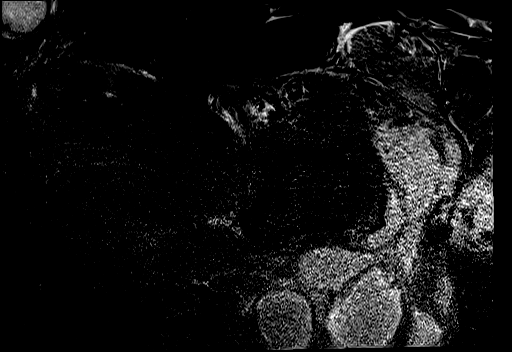
[im 18/35]
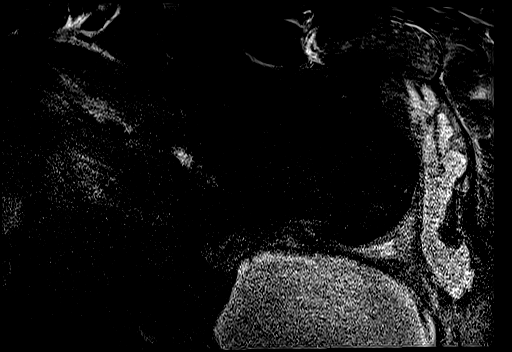
[im 26/35]
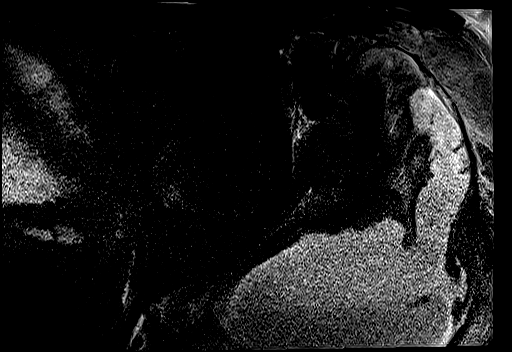
[im 35/35]
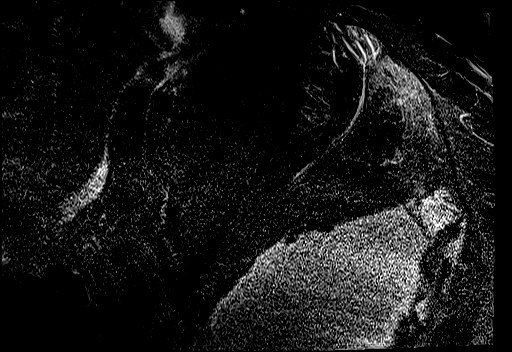

[34 of 40 positions shown; findings below may reference images not displayed]

FINDINGS: Bones: There is no evidence of acute fracture, dislocation or
avascular necrosis. No areas of cortical destruction or periosteal
reaction is seen. The visualized bony pelvis appears normal. The
visualized sacroiliac joints and symphysis pubis appear normal.

Articular cartilage and labrum

Articular cartilage: No focal chondral defect or subchondral signal
abnormality identified.

Labrum: There is no gross labral tear or paralabral abnormality.

Joint or bursal effusion

Joint effusion: No significant hip joint effusion.

Bursae: No focal periarticular fluid collection.

Muscles and tendons

Muscles and tendons: There is a large multilocular cystic mass seen
within the gluteal musculature measuring approximately 22 cm in
craniocaudal dimension. The loculated fluid collection extends in
anteriorly into the trochanteric bursa. There is scattered debris
seen within the collection. Increased feathery signal seen
throughout the remainder of the muscles surrounding the hip. This is
most notable within the abductor, piriformis, and psoas musculature.
A however appear to be intact. There is limited visualization of the
gluteal tendons, however they do appear to be intact. The hamstrings
and iliopsoas tendons are intact.

Other findings

Miscellaneous: A moderate amount of ascites is present within the
deep pelvis.
IMPRESSION: 1. Large multilocular septated cystic mass within the gluteal
musculature extending into the trochanteric bursa which could
chronic organizing hematoma/seroma or intramuscular abscess. No
evidence of osteomyelitis.
2. Diffuse muscular edema surrounding the hip, most notable within
the psoas and adductor musculature
3. Moderate pelvic ascites.

## 2019-06-13 NOTE — Progress Notes (Signed)
Patient is scheduled for TEE for endocarditis on Monday 06/16/19 at noon with Dr. Meda Coffee. NPO at [facility] Sunday night.

## 2019-06-13 NOTE — Progress Notes (Signed)
PROGRESS NOTE    Robert Sullivan  OVZ:[id number] DOB: [birth year] DOA: 06/10/2019 PCP: Cathleen Corti, PA-C    Brief Narrative:  53 y.o. male with history of uncontrolled type 2 diabetes mellitus, morbid obesity was admitted to [facility] from 11/2-11/7 with E. coli UTI, uncontrolled diabetes and a fall that resulted in a left gluteal hematoma, he was treated with IV antibiotics, discharged home on Bactrim, completed course and also on insulin. -He felt well initially after discharge, in the last 7 to 10 days he started having fevers and chills, profuse sweats, 5 to 6 days ago he started having increased pain and swelling at the site of his gluteal hematoma, he also noticed that his urine was darker but denied any dysuria or change in odor presented to the emergency room today with weakness, worsening left hip pain, fevers chills and sweats. -In the ED he was noted to have a sodium of 131, CBG of 380, albumin of 1.8, ALP of 224,  lactic acid of 5.5, white count of 20,000.  In the ED he was noted to have an elevated D-dimer subsequently EDP proceeded with CT angiogram which was negative for pulmonary embolism, his UA was significantly abnormal with cloudy urine, positive nitrite, leukocyte esterase, numerous WBC and bacteria.  In addition noted to have redness and tenderness of his left gluteal region  Assessment & Plan:   Active Problems:   Acute lower UTI   Sepsis ([place])   Uncontrolled diabetes mellitus (HCC)   Obesity, Class III, BMI 40-49.9 (morbid obesity) (HCC)   Cellulitis, gluteal, left  Sepsis secondary to MSSA bacteremia -Blood cx pos for MSSA, ID now consulted with recommendation for 2d echo to r/o endocarditis, neg findings -Will benefit from TEE. Will discuss with Cardiology -currently on ancef per ID -CT reviewed, no drainable abscess identified in L hip. Have ordered follow up MRI L hip, however pt refused last night -Lactate had improved  Urinary tract infection -Continued on  ancef -Presenting UA suggestive of UTI, however, pt denies dysuria or other symptoms  Left gluteal myositis, pain -Per above, pt is continued on ancef -Marked pain with findings suggestive of myositis on CT -f/u MRI ordered, however pt had refused last night secondary to pain  Morbid obesity -BMI of 47 -diet/lifestyle modification recommended  Uncontrolled type 2 diabetes mellitus -Hemoglobin A1c was 11 recently -Resume Lantus and NovoLog premeal and sliding scale insulin -Continues to be stable at this time  Suspected cirrhosis -Noted on imaging, patient denies any alcohol use currently but did drink regularly in the past, suspect NASH/fatty liver disease is likely the primary culprit -Hep C neg -GI follow up recommended  DVT prophylaxis: Heparin subQ Code Status: Full Family Communication: Pt in room, family not at bedside Disposition Plan: Uncertain at this time  Consultants:   ID  Procedures:     Antimicrobials: Anti-infectives (From admission, onward)   Start     Dose/Rate Route Frequency Ordered Stop   06/11/19 2200  ceFAZolin (ANCEF) IVPB 2g/100 mL premix     2 g 200 mL/hr over 30 Minutes Intravenous Every 8 hours 06/11/19 1456     06/11/19 0200  vancomycin (VANCOCIN) 1,750 mg in sodium chloride 0.9 % 500 mL IVPB  Status:  Discontinued     1,750 mg 250 mL/hr over 120 Minutes Intravenous Every 12 hours 06/10/19 1257 06/11/19 1456   06/10/19 2200  ceFEPIme (MAXIPIME) 2 g in sodium chloride 0.9 % 100 mL IVPB  Status:  Discontinued  2 g 200 mL/hr over 30 Minutes Intravenous Every 8 hours 06/10/19 1257 06/10/19 1740   06/10/19 1745  cefTRIAXone (ROCEPHIN) 1 g in sodium chloride 0.9 % 100 mL IVPB  Status:  Discontinued     1 g 200 mL/hr over 30 Minutes Intravenous Every 24 hours 06/10/19 1740 06/11/19 1456   06/10/19 1245  ceFEPIme (MAXIPIME) 2 g in sodium chloride 0.9 % 100 mL IVPB     2 g 200 mL/hr over 30 Minutes Intravenous  Once 06/10/19 1239 06/10/19  1411   06/10/19 1245  vancomycin (VANCOCIN) IVPB 1000 mg/200 mL premix  Status:  Discontinued     1,000 mg 200 mL/hr over 60 Minutes Intravenous  Once 06/10/19 1239 06/10/19 1241   06/10/19 1245  vancomycin (VANCOCIN) 2,500 mg in sodium chloride 0.9 % 500 mL IVPB     2,500 mg 250 mL/hr over 120 Minutes Intravenous  Once 06/10/19 1241 06/10/19 1635      Subjective: Still complaining of marked pain involving L hip  Objective: Vitals:   06/12/19 2259 06/13/19 0044 06/13/19 0433 06/13/19 0908  BP:   118/77 125/66  Pulse: (!) 112 98 87 93  Resp:   18 18  Temp: 98.7 F (37.1 C)  97.7 F (36.5 C) 98.4 F (36.9 C)  TempSrc: Oral  Oral Oral  SpO2:   95% 97%  Weight:      Height:        Intake/Output Summary (Last 24 hours) at 06/13/2019 1011 Last data filed at 06/13/2019 0930 Gross per 24 hour  Intake 800 ml  Output 1000 ml  Net -200 ml   Filed Weights   06/11/19 0301 06/11/19 0607 06/11/19 2134  Weight: (!) 166.1 kg (!) 166.1 kg (!) 159.5 kg    Examination: General exam: Conversant, in no acute distress Respiratory system: normal chest rise, clear, no audible wheezing Cardiovascular system: regular rhythm, s1-s2 Gastrointestinal system: Nondistended, nontender, pos BS Central nervous system: No seizures, no tremors Extremities: No cyanosis, no joint deformities Skin: No rashes, no pallor Psychiatry: Affect normal // no auditory hallucinations   Data Reviewed: I have personally reviewed following labs and imaging studies  CBC: Recent Labs  Lab 06/10/19 1129 06/11/19 0721 06/12/19 0318 06/13/19 0236  WBC 20.4* 16.5* 16.6* 18.3*  NEUTROABS 17.6*  --   --   --   HGB 13.6 11.5* 12.1* 12.2*  HCT 41.0 34.6* 35.3* 35.4*  MCV 92.3 91.5 89.4 89.6  PLT 198 150 150 527*   Basic Metabolic Panel: Recent Labs  Lab 06/10/19 1129 06/11/19 0721 06/12/19 0318 06/13/19 0236  NA 131* 133* 130* 131*  K 4.8 5.1 4.6 5.0  CL 95* 102 101 101  CO2 14* 20* 21* 20*  GLUCOSE  380* 196* 201* 173*  BUN 16 19 24* 23*  CREATININE 1.09 0.87 0.90 0.78  CALCIUM 8.6* 7.8* 8.0* 8.2*   GFR: Estimated Creatinine Clearance: 174.9 mL/min (by C-G formula based on SCr of 0.78 mg/dL). Liver Function Tests: Recent Labs  Lab 06/10/19 1129 06/11/19 0721 06/12/19 0318 06/13/19 0236  AST 72* 67* 83* 82*  ALT 44 39 42 37  ALKPHOS 224* 184* 238* 272*  BILITOT 1.7* 1.5* 1.2 1.2  PROT 7.4 5.8* 6.2* 6.0*  ALBUMIN 1.8* 1.4* 1.4* 1.3*   No results for input(s): LIPASE, AMYLASE in the last 168 hours. No results for input(s): AMMONIA in the last 168 hours. Coagulation Profile: Recent Labs  Lab 06/10/19 1345  INR 1.4*   Cardiac Enzymes: Recent Labs  Lab 06/10/19 1129 06/12/19 0318  CKTOTAL 23* 15*   BNP (last 3 results) No results for input(s): PROBNP in the last 8760 hours. HbA1C: No results for input(s): HGBA1C in the last 72 hours. CBG: Recent Labs  Lab 06/12/19 0648 06/12/19 1130 06/12/19 1626 06/12/19 2050 06/13/19 0651  GLUCAP 194* 198* 163* 159* 163*   Lipid Profile: No results for input(s): CHOL, HDL, LDLCALC, TRIG, CHOLHDL, LDLDIRECT in the last 72 hours. Thyroid Function Tests: No results for input(s): TSH, T4TOTAL, FREET4, T3FREE, THYROIDAB in the last 72 hours. Anemia Panel: No results for input(s): VITAMINB12, FOLATE, FERRITIN, TIBC, IRON, RETICCTPCT in the last 72 hours. Sepsis Labs: Recent Labs  Lab 06/10/19 1129 06/10/19 1345  LATICACIDVEN 5.5* 2.6*    Recent Results (from the past 240 hour(s))  Blood culture (routine x 2)     Status: Abnormal   Collection Time: 06/10/19  1:25 PM   Specimen: BLOOD  Result Value Ref Range Status   Specimen Description BLOOD LEFT ANTECUBITAL  Final   Special Requests   Final    BOTTLES DRAWN AEROBIC AND ANAEROBIC Blood Culture adequate volume   Culture  Setup Time   Final    GRAM POSITIVE COCCI IN CLUSTERS IN BOTH AEROBIC AND ANAEROBIC BOTTLES CRITICAL RESULT CALLED TO, READ BACK BY AND VERIFIED  WITH: K. AMEND PHARMD, AT [phone] 06/11/19 BY D. VANHOOK    Culture (A)  Final    STAPHYLOCOCCUS AUREUS SUSCEPTIBILITIES PERFORMED ON PREVIOUS CULTURE WITHIN THE LAST 5 DAYS. Performed at [facility], [place] [address], [place], [place] [address]    Report Status 06/13/2019 FINAL  Final  Blood culture (routine x 2)     Status: Abnormal   Collection Time: 06/10/19  1:35 PM   Specimen: BLOOD  Result Value Ref Range Status   Specimen Description BLOOD BLOOD RIGHT FOREARM  Final   Special Requests   Final    BOTTLES DRAWN AEROBIC AND ANAEROBIC Blood Culture adequate volume   Culture  Setup Time   Final    GRAM POSITIVE COCCI AEROBIC BOTTLE ONLY CRITICAL VALUE NOTED.  VALUE IS CONSISTENT WITH PREVIOUSLY REPORTED AND CALLED VALUE. Performed at [facility], [place] [address], [place], [place] [address]    Culture STAPHYLOCOCCUS AUREUS (A)  Final   Report Status 06/13/2019 FINAL  Final   Organism ID, Bacteria STAPHYLOCOCCUS AUREUS  Final      Susceptibility   Staphylococcus aureus - MIC*    CIPROFLOXACIN <=0.5 SENSITIVE Sensitive     ERYTHROMYCIN <=0.25 SENSITIVE Sensitive     GENTAMICIN <=0.5 SENSITIVE Sensitive     OXACILLIN 0.5 SENSITIVE Sensitive     TETRACYCLINE >=16 RESISTANT Resistant     VANCOMYCIN 1 SENSITIVE Sensitive     TRIMETH/SULFA <=10 SENSITIVE Sensitive     CLINDAMYCIN <=0.25 SENSITIVE Sensitive     RIFAMPIN <=0.5 SENSITIVE Sensitive     Inducible Clindamycin NEGATIVE Sensitive     * STAPHYLOCOCCUS AUREUS  Blood Culture ID Panel (Reflexed)     Status: Abnormal   Collection Time: 06/10/19  1:35 PM  Result Value Ref Range Status   Enterococcus species NOT DETECTED NOT DETECTED Final   Listeria monocytogenes NOT DETECTED NOT DETECTED Final   Staphylococcus species DETECTED (A) NOT DETECTED Final    Comment: CRITICAL RESULT CALLED TO, READ BACK BY AND VERIFIED WITH: Andres Shad PharmD 14:55 06/11/19 (wilsonm)    Staphylococcus aureus (BCID) DETECTED (A) NOT DETECTED  Final    Comment: Methicillin (oxacillin) susceptible Staphylococcus  aureus (MSSA). Preferred therapy is anti staphylococcal beta lactam antibiotic (Cefazolin or Nafcillin), unless clinically contraindicated. CRITICAL RESULT CALLED TO, READ BACK BY AND VERIFIED WITH: Andres Shad PharmD 14:55 06/11/19 (wilsonm)    Methicillin resistance NOT DETECTED NOT DETECTED Final   Streptococcus species NOT DETECTED NOT DETECTED Final   Streptococcus agalactiae NOT DETECTED NOT DETECTED Final   Streptococcus pneumoniae NOT DETECTED NOT DETECTED Final   Streptococcus pyogenes NOT DETECTED NOT DETECTED Final   Acinetobacter baumannii NOT DETECTED NOT DETECTED Final   Enterobacteriaceae species NOT DETECTED NOT DETECTED Final   Enterobacter cloacae complex NOT DETECTED NOT DETECTED Final   Escherichia coli NOT DETECTED NOT DETECTED Final   Klebsiella oxytoca NOT DETECTED NOT DETECTED Final   Klebsiella pneumoniae NOT DETECTED NOT DETECTED Final   Proteus species NOT DETECTED NOT DETECTED Final   Serratia marcescens NOT DETECTED NOT DETECTED Final   Haemophilus influenzae NOT DETECTED NOT DETECTED Final   Neisseria meningitidis NOT DETECTED NOT DETECTED Final   Pseudomonas aeruginosa NOT DETECTED NOT DETECTED Final   Candida albicans NOT DETECTED NOT DETECTED Final   Candida glabrata NOT DETECTED NOT DETECTED Final   Candida krusei NOT DETECTED NOT DETECTED Final   Candida parapsilosis NOT DETECTED NOT DETECTED Final   Candida tropicalis NOT DETECTED NOT DETECTED Final    Comment: Performed at [facility], [address], [place], [place] [address]  Urine culture     Status: Abnormal   Collection Time: 06/10/19  1:45 PM   Specimen: Urine, Clean Catch  Result Value Ref Range Status   Specimen Description URINE, CLEAN CATCH  Final   Special Requests   Final    URINE, RANDOM Performed at [facility], [place] [address] [place], [place] [address]    Culture >=100,000 COLONIES/mL STAPHYLOCOCCUS  AUREUS (A)  Final   Report Status 06/12/2019 FINAL  Final   Organism ID, Bacteria STAPHYLOCOCCUS AUREUS (A)  Final      Susceptibility   Staphylococcus aureus - MIC*    CIPROFLOXACIN <=0.5 SENSITIVE Sensitive     GENTAMICIN <=0.5 SENSITIVE Sensitive     NITROFURANTOIN 32 SENSITIVE Sensitive     OXACILLIN 0.5 SENSITIVE Sensitive     TETRACYCLINE >=16 RESISTANT Resistant     VANCOMYCIN 1 SENSITIVE Sensitive     TRIMETH/SULFA <=10 SENSITIVE Sensitive     CLINDAMYCIN <=0.25 SENSITIVE Sensitive     RIFAMPIN <=0.5 SENSITIVE Sensitive     Inducible Clindamycin NEGATIVE Sensitive     * >=100,000 COLONIES/mL STAPHYLOCOCCUS AUREUS  SARS CORONAVIRUS 2 (TAT 6-24 HRS) Nasopharyngeal Nasopharyngeal Swab     Status: None   Collection Time: 06/10/19  6:40 PM   Specimen: Nasopharyngeal Swab  Result Value Ref Range Status   SARS Coronavirus 2 NEGATIVE NEGATIVE Final    Comment: (NOTE) SARS-CoV-2 target nucleic acids are NOT DETECTED. The SARS-CoV-2 RNA is generally detectable in upper and lower respiratory specimens during the acute phase of infection. Negative results do not preclude SARS-CoV-2 infection, do not rule out co-infections with other pathogens, and should not be used as the sole basis for treatment or other patient management decisions. Negative results must be combined with clinical observations, patient history, and epidemiological information. The expected result is Negative. Fact Sheet for Patients: SugarRoll.be Fact Sheet for Healthcare Providers: https://www.woods-mathews.com/ This test is not yet approved or cleared by the [place] FDA and  has been authorized for detection and/or diagnosis of SARS-CoV-2 by FDA under an Emergency Use Authorization (EUA). This EUA  will remain  in effect (meaning this test can be used) for the duration of the COVID-19 declaration under Section 56 4(b)(1) of the Act, 21 U.S.C. section  360bbb-3(b)(1), unless the authorization is terminated or revoked sooner. Performed at [facility], [place] [address] [place], [place] [address]   Culture, blood (routine x 2)     Status: None (Preliminary result)   Collection Time: 06/11/19  4:35 PM   Specimen: BLOOD LEFT HAND  Result Value Ref Range Status   Specimen Description BLOOD LEFT HAND  Final   Special Requests   Final    BOTTLES DRAWN AEROBIC AND ANAEROBIC Blood Culture adequate volume   Culture   Final    NO GROWTH 2 DAYS Performed at [facility], [place] [address], [place], [place] [address]    Report Status PENDING  Incomplete  Culture, blood (routine x 2)     Status: None (Preliminary result)   Collection Time: 06/11/19  4:40 PM   Specimen: BLOOD RIGHT HAND  Result Value Ref Range Status   Specimen Description BLOOD RIGHT HAND  Final   Special Requests   Final    BOTTLES DRAWN AEROBIC ONLY Blood Culture adequate volume   Culture   Final    NO GROWTH 2 DAYS Performed at [facility], [place] [address], [place], [place] [address]    Report Status PENDING  Incomplete     Radiology Studies: No results found.  Scheduled Meds: . cholecalciferol  2,000 Units Oral Daily  . heparin  5,000 Units Subcutaneous Q8H  . insulin aspart  0-15 Units Subcutaneous TID WC  . insulin aspart  0-5 Units Subcutaneous QHS  . insulin aspart  8 Units Subcutaneous TID WC  . insulin glargine  25 Units Subcutaneous Daily  . multivitamin with minerals  1 tablet Oral Daily  . Ensure Max Protein  11 oz Oral Daily  . vitamin C  1,000 mg Oral Daily   Continuous Infusions: .  ceFAZolin (ANCEF) IV 2 g (06/13/19 0537)     LOS: 3 days   Marylu Lund, MD [facility] Pager On Amion  If 7PM-7AM, please contact night-coverage 06/13/2019, 10:11 AM

## 2019-06-13 NOTE — Progress Notes (Signed)
[facility] for Infectious Disease   Reason for visit: Follow up on MSSA bacteremia/LT hip myositis  Antibiotics: Cefazolin, day 3 + 1 day of vanc/ctx  Interval History: Pt refused LT hip MRI last evening, reporting fear of transfer from the bed to the MRI exam table.  Per nursing report, he refuses all efforts to ambulate still.  Patient mistakenly believes he had CT scan performed last evening as he is developing poor recall of temporal events (presumably due to frequent Dilaudid doses today).  He still has not had a bowel movement since his hospital admission began.  His wife is present at today's bedside.  Multiple questions were addressed with focus on need for hip imaging versus orthopedic evaluation given the persistence of his left hip pain that is out of proportion with exam. TEE tentatively scheduled for Monday. Fever curve, WBC & Cr trends, imaging, cx results, and ABX usage all independently reviewed    Current Facility-Administered Medications:    acetaminophen (TYLENOL) tablet 650 mg, 650 mg, Oral, Q6H PRN, Blount, Xenia T, NP, 650 mg at 06/12/19 2148   ceFAZolin (ANCEF) IVPB 2g/100 mL premix, 2 g, Intravenous, Q8H, Donne Hazel, MD, Last Rate: 200 mL/hr at 06/13/19 0537, 2 g at 06/13/19 0537   cholecalciferol (VITAMIN D3) tablet 2,000 Units, 2,000 Units, Oral, Daily, Domenic Polite, MD, 2,000 Units at 06/13/19 1054   heparin injection 5,000 Units, 5,000 Units, Subcutaneous, Q8H, Domenic Polite, MD, 5,000 Units at 06/13/19 0537   HYDROmorphone (DILAUDID) injection 0.5 mg, 0.5 mg, Intravenous, Q4H PRN, Donne Hazel, MD, 0.5 mg at 06/13/19 1209   insulin aspart (novoLOG) injection 0-15 Units, 0-15 Units, Subcutaneous, TID WC, Domenic Polite, MD, 3 Units at 06/13/19 6222   insulin aspart (novoLOG) injection 0-5 Units, 0-5 Units, Subcutaneous, QHS, Domenic Polite, MD, 2 Units at 06/11/19 2206   insulin aspart (novoLOG) injection 8 Units, 8 Units, Subcutaneous,  TID WC, Domenic Polite, MD, 8 Units at 06/13/19 0811   insulin glargine (LANTUS) injection 25 Units, 25 Units, Subcutaneous, Daily, Domenic Polite, MD, 25 Units at 06/12/19 2307   metoprolol tartrate (LOPRESSOR) injection 5 mg, 5 mg, Intravenous, Q5 min PRN, Blount, Lolita Cram, NP   multivitamin with minerals tablet 1 tablet, 1 tablet, Oral, Daily, Donne Hazel, MD, 1 tablet at 06/13/19 1054   protein supplement (ENSURE MAX) liquid, 11 oz, Oral, Daily, Donne Hazel, MD, 11 oz at 06/12/19 9798   vitamin C (ASCORBIC ACID) tablet 1,000 mg, 1,000 mg, Oral, Daily, Domenic Polite, MD, 1,000 mg at 06/13/19 1054   Physical Exam:   Vitals:   06/13/19 0433 06/13/19 0908  BP: 118/77 125/66  Pulse: 87 93  Resp: 18 18  Temp: 97.7 F (36.5 C) 98.4 F (36.9 C)  SpO2: 95% 97%   Physical Exam Gen: uncooperative at times, morbidly obese, mild distress secondary to LT hip/gluteal pain , A&Ox 3 Head: NCAT, no temporal wasting evident EENT: PERRL, EOMI, MMM, adequate dentition Neck: supple, no JVD CV: NRRR, no murmurs evident but heart sounds are distant due to body habitus Pulm: CTA bilaterally, no wheeze or retractions Abd: soft, obese, NTND, +BS (hypoactive) Extrems: 1+ non-pitting LE edema, 1+ pulses MSK: limited ROM to LT hip with persistent induration laterally to LT hip with minimal erythema but +TTP with deep palpation, decreased ROM to LT hip Skin:poor fingernail hygiene, minimal LT hip erythema, adequate skin turgor Neuro: CN II-XII grossly intact, no focal neurologic deficits appreciated but gait was unable to be  assessed, A&Ox 3   Review of Systems:  Review of Systems  Constitutional: Positive for malaise/fatigue. Negative for chills, fever and weight loss.  HENT: Negative for congestion, hearing loss, sinus pain and sore throat.   Eyes: Negative for blurred vision, photophobia and discharge.  Respiratory: Negative for cough, hemoptysis and shortness of breath.     Cardiovascular: Positive for leg swelling. Negative for chest pain, palpitations and orthopnea.  Gastrointestinal: Positive for constipation. Negative for abdominal pain, diarrhea, heartburn, nausea and vomiting.  Genitourinary: Negative for dysuria, flank pain, frequency and urgency.  Musculoskeletal: Positive for joint pain and myalgias. Negative for back pain.  Skin: Negative for itching and rash.  Neurological: Positive for weakness. Negative for tremors, seizures and headaches.  Endo/Heme/Allergies: Negative for polydipsia. Does not bruise/bleed easily.  Psychiatric/Behavioral: Negative for depression and substance abuse. The patient is nervous/anxious and has insomnia.      Lab Results  Component Value Date   WBC 18.3 (H) 06/13/2019   HGB 12.2 (L) 06/13/2019   HCT 35.4 (L) 06/13/2019   MCV 89.6 06/13/2019   PLT 148 (L) 06/13/2019    Lab Results  Component Value Date   CREATININE 0.78 06/13/2019   BUN 23 (H) 06/13/2019   NA 131 (L) 06/13/2019   K 5.0 06/13/2019   CL 101 06/13/2019   CO2 20 (L) 06/13/2019    Lab Results  Component Value Date   ALT 37 06/13/2019   AST 82 (H) 06/13/2019   ALKPHOS 272 (H) 06/13/2019     Microbiology: Recent Results (from the past 240 hour(s))  Blood culture (routine x 2)     Status: Abnormal   Collection Time: 06/10/19  1:25 PM   Specimen: BLOOD  Result Value Ref Range Status   Specimen Description BLOOD LEFT ANTECUBITAL  Final   Special Requests   Final    BOTTLES DRAWN AEROBIC AND ANAEROBIC Blood Culture adequate volume   Culture  Setup Time   Final    GRAM POSITIVE COCCI IN CLUSTERS IN BOTH AEROBIC AND ANAEROBIC BOTTLES CRITICAL RESULT CALLED TO, READ BACK BY AND VERIFIED WITH: K. AMEND PHARMD, AT [place] 06/11/19 BY D. VANHOOK    Culture (A)  Final    STAPHYLOCOCCUS AUREUS SUSCEPTIBILITIES PERFORMED ON PREVIOUS CULTURE WITHIN THE LAST 5 DAYS. Performed at [facility], [place] [address], [place], [place] [address]    Report  Status 06/13/2019 FINAL  Final  Blood culture (routine x 2)     Status: Abnormal   Collection Time: 06/10/19  1:35 PM   Specimen: BLOOD  Result Value Ref Range Status   Specimen Description BLOOD BLOOD RIGHT FOREARM  Final   Special Requests   Final    BOTTLES DRAWN AEROBIC AND ANAEROBIC Blood Culture adequate volume   Culture  Setup Time   Final    GRAM POSITIVE COCCI AEROBIC BOTTLE ONLY CRITICAL VALUE NOTED.  VALUE IS CONSISTENT WITH PREVIOUSLY REPORTED AND CALLED VALUE. Performed at [facility], [place] [address] [place], [place] [address]    Culture STAPHYLOCOCCUS AUREUS (A)  Final   Report Status 06/13/2019 FINAL  Final   Organism ID, Bacteria STAPHYLOCOCCUS AUREUS  Final      Susceptibility   Staphylococcus aureus - MIC*    CIPROFLOXACIN <=0.5 SENSITIVE Sensitive     ERYTHROMYCIN <=0.25 SENSITIVE Sensitive     GENTAMICIN <=0.5 SENSITIVE Sensitive     OXACILLIN 0.5 SENSITIVE Sensitive     TETRACYCLINE >=16 RESISTANT Resistant     VANCOMYCIN  1 SENSITIVE Sensitive     TRIMETH/SULFA <=10 SENSITIVE Sensitive     CLINDAMYCIN <=0.25 SENSITIVE Sensitive     RIFAMPIN <=0.5 SENSITIVE Sensitive     Inducible Clindamycin NEGATIVE Sensitive     * STAPHYLOCOCCUS AUREUS  Blood Culture ID Panel (Reflexed)     Status: Abnormal   Collection Time: 06/10/19  1:35 PM  Result Value Ref Range Status   Enterococcus species NOT DETECTED NOT DETECTED Final   Listeria monocytogenes NOT DETECTED NOT DETECTED Final   Staphylococcus species DETECTED (A) NOT DETECTED Final    Comment: CRITICAL RESULT CALLED TO, READ BACK BY AND VERIFIED WITH: Andres Shad PharmD 14:55 06/11/19 ([email or web address])    Staphylococcus aureus (BCID) DETECTED (A) NOT DETECTED Final    Comment: Methicillin (oxacillin) susceptible Staphylococcus aureus (MSSA). Preferred therapy is anti staphylococcal beta lactam antibiotic (Cefazolin or Nafcillin), unless clinically contraindicated. CRITICAL RESULT CALLED TO, READ BACK BY AND VERIFIED  WITH: Andres Shad PharmD 14:55 06/11/19 ([email or web address])    Methicillin resistance NOT DETECTED NOT DETECTED Final   Streptococcus species NOT DETECTED NOT DETECTED Final   Streptococcus agalactiae NOT DETECTED NOT DETECTED Final   Streptococcus pneumoniae NOT DETECTED NOT DETECTED Final   Streptococcus pyogenes NOT DETECTED NOT DETECTED Final   Acinetobacter baumannii NOT DETECTED NOT DETECTED Final   Enterobacteriaceae species NOT DETECTED NOT DETECTED Final   Enterobacter cloacae complex NOT DETECTED NOT DETECTED Final   Escherichia coli NOT DETECTED NOT DETECTED Final   Klebsiella oxytoca NOT DETECTED NOT DETECTED Final   Klebsiella pneumoniae NOT DETECTED NOT DETECTED Final   Proteus species NOT DETECTED NOT DETECTED Final   Serratia marcescens NOT DETECTED NOT DETECTED Final   Haemophilus influenzae NOT DETECTED NOT DETECTED Final   Neisseria meningitidis NOT DETECTED NOT DETECTED Final   Pseudomonas aeruginosa NOT DETECTED NOT DETECTED Final   Candida albicans NOT DETECTED NOT DETECTED Final   Candida glabrata NOT DETECTED NOT DETECTED Final   Candida krusei NOT DETECTED NOT DETECTED Final   Candida parapsilosis NOT DETECTED NOT DETECTED Final   Candida tropicalis NOT DETECTED NOT DETECTED Final    Comment: Performed at [facility], [address], [place], [place] [address]  Urine culture     Status: Abnormal   Collection Time: 06/10/19  1:45 PM   Specimen: Urine, Clean Catch  Result Value Ref Range Status   Specimen Description URINE, CLEAN CATCH  Final   Special Requests   Final    URINE, RANDOM Performed at [facility], [place] [address], [place], [place] [address]    Culture >=100,000 COLONIES/mL STAPHYLOCOCCUS AUREUS (A)  Final   Report Status 06/12/2019 FINAL  Final   Organism ID, Bacteria STAPHYLOCOCCUS AUREUS (A)  Final      Susceptibility   Staphylococcus aureus - MIC*    CIPROFLOXACIN <=0.5 SENSITIVE Sensitive     GENTAMICIN <=0.5 SENSITIVE Sensitive      NITROFURANTOIN 32 SENSITIVE Sensitive     OXACILLIN 0.5 SENSITIVE Sensitive     TETRACYCLINE >=16 RESISTANT Resistant     VANCOMYCIN 1 SENSITIVE Sensitive     TRIMETH/SULFA <=10 SENSITIVE Sensitive     CLINDAMYCIN <=0.25 SENSITIVE Sensitive     RIFAMPIN <=0.5 SENSITIVE Sensitive     Inducible Clindamycin NEGATIVE Sensitive     * >=100,000 COLONIES/mL STAPHYLOCOCCUS AUREUS  SARS CORONAVIRUS 2 (TAT 6-24 HRS) Nasopharyngeal Nasopharyngeal Swab     Status: None   Collection Time: 06/10/19  6:40 PM   Specimen: Nasopharyngeal Swab  Result Value  Ref Range Status   SARS Coronavirus 2 NEGATIVE NEGATIVE Final    Comment: (NOTE) SARS-CoV-2 target nucleic acids are NOT DETECTED. The SARS-CoV-2 RNA is generally detectable in upper and lower respiratory specimens during the acute phase of infection. Negative results do not preclude SARS-CoV-2 infection, do not rule out co-infections with other pathogens, and should not be used as the sole basis for treatment or other patient management decisions. Negative results must be combined with clinical observations, patient history, and epidemiological information. The expected result is Negative. Fact Sheet for Patients: SugarRoll.be Fact Sheet for Healthcare Providers: https://www.woods-mathews.com/ This test is not yet approved or cleared by the [place] FDA and  has been authorized for detection and/or diagnosis of SARS-CoV-2 by FDA under an Emergency Use Authorization (EUA). This EUA will remain  in effect (meaning this test can be used) for the duration of the COVID-19 declaration under Section 56 4(b)(1) of the Act, 21 U.S.C. section 360bbb-3(b)(1), unless the authorization is terminated or revoked sooner. Performed at [facility], [place] [address], [place], [place] [address]   Culture, blood (routine x 2)     Status: None (Preliminary result)   Collection Time: 06/11/19  4:35 PM   Specimen:  BLOOD LEFT HAND  Result Value Ref Range Status   Specimen Description BLOOD LEFT HAND  Final   Special Requests   Final    BOTTLES DRAWN AEROBIC AND ANAEROBIC Blood Culture adequate volume   Culture   Final    NO GROWTH 2 DAYS Performed at [facility], [place] [address] [place], [place] [address]    Report Status PENDING  Incomplete  Culture, blood (routine x 2)     Status: None (Preliminary result)   Collection Time: 06/11/19  4:40 PM   Specimen: BLOOD RIGHT HAND  Result Value Ref Range Status   Specimen Description BLOOD RIGHT HAND  Final   Special Requests   Final    BOTTLES DRAWN AEROBIC ONLY Blood Culture adequate volume   Culture   Final    NO GROWTH 2 DAYS Performed at [facility], [place] [address], [place], [place] [address]    Report Status PENDING  Incomplete    Impression/Plan: Patient is a 53 year old-year-old morbidly obese white male with poorly controlled diabetes and multiple recent falls resulting in a recent left gluteal hematoma now presenting with MSSA bacteremia, left hip myositis/pain, and leukocytosis.  1.  MSSA bacteremia -continue cefazolin 2 g IV every 8 hours as both of the patient's initial blood cultures were positive for MSSA.  Most likely source of his bacteremia would be his left hip hematoma/myositis following his recent falls at home.  His transthoracic echocardiogram showed no evidence of vegetation or significant regurgitant valve but the study was difficult due to the patient's large body habitus.  Cardiology has assessed the patient today and he is tentatively scheduled for a TEE on Monday.  Repeat blood cultures thus far show no growth to date.  2.  Left hip myositis -while the patient's recent CK values have remained within normal limits, his most recent CT imaging on this admission was suggestive of advanced myositis to his left hip.  Given the patient's continued ongoing complaints of pain refractory to narcotics, it is reasonable to  proceed with an MRI of his left hip to best exclude an abscess or deep joint effusion.  I spent well over 30 minutes of time speaking with the patient and his wife and his wife today  explaining the importance of diagnostic studies including an MRI and or aspirate of his left hip by orthopedics to exclude a septic joint infection as a complication of his bacteremia.  The patient finally agreed to have an MRI performed this evening, so I have asked his nurse to check with radiology to see if this can be done tonight or no later than tomorrow morning.  I again stressed the importance of efforts to ambulate or weight-bear to the patient as this is a clinical assessment of his hip.  I also reminded him that simple myositis much like his prior hematoma will often resorb with structured physical activity.  The patient's persistently elevated leukocytosis despite appropriate antibiotic treatment that his lab for clearance of his bacteremia remains worrisome for a protected focus of infection such as a periarticular abscess or true septic joint infection.  I would continue judicious use of narcotics and consider adding a bowel regimen to ensure the patient does not develop a narcotic ileus as a complication during this admission.  3.  Poorly controlled diabetes -while the patient's outpatient compliance with glucometer readings and insulin has been extremely poor with blood sugars ranging in the upper 300s to 400 range when checked, his blood sugars have recently dramatically improved over the last 24 to 36 hours.  I appreciate the hospitalist assistance with this issue as maintaining a blood sugar of 150 or less as best able will optimize his infectious outcome as well.

## 2019-06-14 LAB — CBC
HCT: 35 % — ABNORMAL LOW (ref 39.0–52.0)
Hemoglobin: 12 g/dL — ABNORMAL LOW (ref 13.0–17.0)
MCH: 30.6 pg (ref 26.0–34.0)
MCHC: 34.3 g/dL (ref 30.0–36.0)
MCV: 89.3 fL (ref 80.0–100.0)
Platelets: 148 10*3/uL — ABNORMAL LOW (ref 150–400)
RBC: 3.92 MIL/uL — ABNORMAL LOW (ref 4.22–5.81)
RDW: 14.3 % (ref 11.5–15.5)
WBC: 19.8 10*3/uL — ABNORMAL HIGH (ref 4.0–10.5)
nRBC: 0 % (ref 0.0–0.2)

## 2019-06-14 LAB — COMPREHENSIVE METABOLIC PANEL
ALT: 32 U/L (ref 0–44)
AST: 88 U/L — ABNORMAL HIGH (ref 15–41)
Albumin: 1.3 g/dL — ABNORMAL LOW (ref 3.5–5.0)
Alkaline Phosphatase: 282 U/L — ABNORMAL HIGH (ref 38–126)
Anion gap: 9 (ref 5–15)
BUN: 20 mg/dL (ref 6–20)
CO2: 22 mmol/L (ref 22–32)
Calcium: 7.9 mg/dL — ABNORMAL LOW (ref 8.9–10.3)
Chloride: 101 mmol/L (ref 98–111)
Creatinine, Ser: 0.76 mg/dL (ref 0.61–1.24)
GFR calc Af Amer: >60 mL/min (ref >60)
GFR calc non Af Amer: >60 mL/min (ref >60)
Glucose, Bld: 186 mg/dL — ABNORMAL HIGH (ref 70–99)
Potassium: 4.6 mmol/L (ref 3.5–5.1)
Sodium: 132 mmol/L — ABNORMAL LOW (ref 135–145)
Total Bilirubin: 1.5 mg/dL — ABNORMAL HIGH (ref 0.3–1.2)
Total Protein: 6.2 g/dL — ABNORMAL LOW (ref 6.5–8.1)

## 2019-06-14 LAB — GLUCOSE, CAPILLARY
Glucose-Capillary: 164 mg/dL — ABNORMAL HIGH (ref 70–99)
Glucose-Capillary: 186 mg/dL — ABNORMAL HIGH (ref 70–99)
Glucose-Capillary: 172 mg/dL — ABNORMAL HIGH (ref 70–99)
Glucose-Capillary: 168 mg/dL — ABNORMAL HIGH (ref 70–99)

## 2019-06-14 NOTE — Progress Notes (Signed)
PROGRESS NOTE    Robert Sullivan  MHW:[id number] DOB: [birth year] DOA: 06/10/2019 PCP: Cathleen Corti, PA-C    Brief Narrative:  53 y.o. male with history of uncontrolled type 2 diabetes mellitus, morbid obesity was admitted to [facility] from 11/2-11/7 with E. coli UTI, uncontrolled diabetes and a fall that resulted in a left gluteal hematoma, he was treated with IV antibiotics, discharged home on Bactrim, completed course and also on insulin. -He felt well initially after discharge, in the last 7 to 10 days he started having fevers and chills, profuse sweats, 5 to 6 days ago he started having increased pain and swelling at the site of his gluteal hematoma, he also noticed that his urine was darker but denied any dysuria or change in odor presented to the emergency room today with weakness, worsening left hip pain, fevers chills and sweats. -In the ED he was noted to have a sodium of 131, CBG of 380, albumin of 1.8, ALP of 224,  lactic acid of 5.5, white count of 20,000.  In the ED he was noted to have an elevated D-dimer subsequently EDP proceeded with CT angiogram which was negative for pulmonary embolism, his UA was significantly abnormal with cloudy urine, positive nitrite, leukocyte esterase, numerous WBC and bacteria.  In addition noted to have redness and tenderness of his left gluteal region  Assessment & Plan:   Active Problems:   Acute lower UTI   Sepsis ([place])   Uncontrolled diabetes mellitus (HCC)   Obesity, Class III, BMI 40-49.9 (morbid obesity) (HCC)   Cellulitis, gluteal, left  Sepsis secondary to MSSA bacteremia -Blood cx pos for MSSA, ID now consulted with recommendation for 2d echo to r/o endocarditis, neg findings -Will benefit from TEE. Will discuss with Cardiology -currently on ancef per ID -CT reviewed, no drainable abscess identified in L hip. Have ordered follow up MRI L hip, reviewed, findings of large multilocular septated cystic mass within gluteal musculature extending  in to the trochanteric bursa which could be chronic organizing hematoma/seroma or intramuscular abscess -Lactate had improved  Urinary tract infection -Continued on ancef per above -Presenting UA suggestive of UTI, however, pt denies dysuria or other symptoms  Left gluteal myositis, pain -Per above, pt is continued on ancef -Marked pain with findings suggestive of myositis on CT -f/u MRI findings per above  Morbid obesity -BMI of 47 -diet/lifestyle modification recommended  Uncontrolled type 2 diabetes mellitus -Hemoglobin A1c was 11 recently -Resume Lantus and NovoLog premeal and sliding scale insulin -remains stable at this time  Suspected cirrhosis -Noted on imaging, patient denies any alcohol use currently but did drink regularly in the past, suspect NASH/fatty liver disease is likely the primary culprit -Hep C neg -GI follow up recommended  DVT prophylaxis: Heparin subQ Code Status: Full Family Communication: Pt in room, family not at bedside Disposition Plan: Uncertain at this time  Consultants:   ID  Procedures:     Antimicrobials: Anti-infectives (From admission, onward)   Start     Dose/Rate Route Frequency Ordered Stop   06/11/19 2200  ceFAZolin (ANCEF) IVPB 2g/100 mL premix     2 g 200 mL/hr over 30 Minutes Intravenous Every 8 hours 06/11/19 1456     06/11/19 0200  vancomycin (VANCOCIN) 1,750 mg in sodium chloride 0.9 % 500 mL IVPB  Status:  Discontinued     1,750 mg 250 mL/hr over 120 Minutes Intravenous Every 12 hours 06/10/19 1257 06/11/19 1456   06/10/19 2200  ceFEPIme (MAXIPIME) 2 g in sodium  chloride 0.9 % 100 mL IVPB  Status:  Discontinued     2 g 200 mL/hr over 30 Minutes Intravenous Every 8 hours 06/10/19 1257 06/10/19 1740   06/10/19 1745  cefTRIAXone (ROCEPHIN) 1 g in sodium chloride 0.9 % 100 mL IVPB  Status:  Discontinued     1 g 200 mL/hr over 30 Minutes Intravenous Every 24 hours 06/10/19 1740 06/11/19 1456   06/10/19 1245   ceFEPIme (MAXIPIME) 2 g in sodium chloride 0.9 % 100 mL IVPB     2 g 200 mL/hr over 30 Minutes Intravenous  Once 06/10/19 1239 06/10/19 1411   06/10/19 1245  vancomycin (VANCOCIN) IVPB 1000 mg/200 mL premix  Status:  Discontinued     1,000 mg 200 mL/hr over 60 Minutes Intravenous  Once 06/10/19 1239 06/10/19 1241   06/10/19 1245  vancomycin (VANCOCIN) 2,500 mg in sodium chloride 0.9 % 500 mL IVPB     2,500 mg 250 mL/hr over 120 Minutes Intravenous  Once 06/10/19 1241 06/10/19 1635      Subjective: Reports feeling better today  Objective: Vitals:   06/13/19 1658 06/13/19 2025 06/14/19 0338 06/14/19 0900  BP: 125/65 (!) 135/59 (!) 133/94 139/66  Pulse: 99 (!) 124 (!) 107 (!) 110  Resp: 18 18 16 18   Temp: 98.6 F (37 C) 98.7 F (37.1 C) 98.4 F (36.9 C) 99.2 F (37.3 C)  TempSrc: Oral Oral Oral Oral  SpO2: 98% 96% 94% 96%  Weight:  (!) 166 kg    Height:        Intake/Output Summary (Last 24 hours) at 06/14/2019 1501 Last data filed at 06/14/2019 0700 Gross per 24 hour  Intake 910.22 ml  Output 0 ml  Net 910.22 ml   Filed Weights   06/11/19 0607 06/11/19 2134 06/13/19 2025  Weight: (!) 166.1 kg (!) 159.5 kg (!) 166 kg    Examination: General exam: Awake, laying in bed, in nad Respiratory system: Normal respiratory effort, no wheezing Cardiovascular system: regular rate, s1, s2 Gastrointestinal system: Soft, nondistended, positive BS Central nervous system: CN2-12 grossly intact, strength intact Extremities: Perfused, no clubbing Skin: Normal skin turgor, no notable skin lesions seen Psychiatry: Mood normal // no visual hallucinations   Data Reviewed: I have personally reviewed following labs and imaging studies  CBC: Recent Labs  Lab 06/10/19 1129 06/11/19 0721 06/12/19 0318 06/13/19 0236 06/14/19 0442  WBC 20.4* 16.5* 16.6* 18.3* 19.8*  NEUTROABS 17.6*  --   --   --   --   HGB 13.6 11.5* 12.1* 12.2* 12.0*  HCT 41.0 34.6* 35.3* 35.4* 35.0*  MCV 92.3 91.5  89.4 89.6 89.3  PLT 198 150 150 148* 062*   Basic Metabolic Panel: Recent Labs  Lab 06/10/19 1129 06/11/19 0721 06/12/19 0318 06/13/19 0236 06/14/19 0442  NA 131* 133* 130* 131* 132*  K 4.8 5.1 4.6 5.0 4.6  CL 95* 102 101 101 101  CO2 14* 20* 21* 20* 22  GLUCOSE 380* 196* 201* 173* 186*  BUN 16 19 24* 23* 20  CREATININE 1.09 0.87 0.90 0.78 0.76  CALCIUM 8.6* 7.8* 8.0* 8.2* 7.9*   GFR: Estimated Creatinine Clearance: 178.9 mL/min (by C-G formula based on SCr of 0.76 mg/dL). Liver Function Tests: Recent Labs  Lab 06/10/19 1129 06/11/19 0721 06/12/19 0318 06/13/19 0236 06/14/19 0442  AST 72* 67* 83* 82* 88*  ALT 44 39 42 37 32  ALKPHOS 224* 184* 238* 272* 282*  BILITOT 1.7* 1.5* 1.2 1.2 1.5*  PROT 7.4 5.8*  6.2* 6.0* 6.2*  ALBUMIN 1.8* 1.4* 1.4* 1.3* 1.3*   No results for input(s): LIPASE, AMYLASE in the last 168 hours. No results for input(s): AMMONIA in the last 168 hours. Coagulation Profile: Recent Labs  Lab 06/10/19 1345  INR 1.4*   Cardiac Enzymes: Recent Labs  Lab 06/10/19 1129 06/12/19 0318  CKTOTAL 23* 15*   BNP (last 3 results) No results for input(s): PROBNP in the last 8760 hours. HbA1C: No results for input(s): HGBA1C in the last 72 hours. CBG: Recent Labs  Lab 06/13/19 1126 06/13/19 1658 06/13/19 2133 06/14/19 0639 06/14/19 1111  GLUCAP 154* 150* 218* 164* 186*   Lipid Profile: No results for input(s): CHOL, HDL, LDLCALC, TRIG, CHOLHDL, LDLDIRECT in the last 72 hours. Thyroid Function Tests: No results for input(s): TSH, T4TOTAL, FREET4, T3FREE, THYROIDAB in the last 72 hours. Anemia Panel: No results for input(s): VITAMINB12, FOLATE, FERRITIN, TIBC, IRON, RETICCTPCT in the last 72 hours. Sepsis Labs: Recent Labs  Lab 06/10/19 1129 06/10/19 1345  LATICACIDVEN 5.5* 2.6*    Recent Results (from the past 240 hour(s))  Blood culture (routine x 2)     Status: Abnormal   Collection Time: 06/10/19  1:25 PM   Specimen: BLOOD   Result Value Ref Range Status   Specimen Description BLOOD LEFT ANTECUBITAL  Final   Special Requests   Final    BOTTLES DRAWN AEROBIC AND ANAEROBIC Blood Culture adequate volume   Culture  Setup Time   Final    GRAM POSITIVE COCCI IN CLUSTERS IN BOTH AEROBIC AND ANAEROBIC BOTTLES CRITICAL RESULT CALLED TO, READ BACK BY AND VERIFIED WITH: K. AMEND PHARMD, AT 1761 06/11/19 BY D. VANHOOK    Culture (A)  Final    STAPHYLOCOCCUS AUREUS SUSCEPTIBILITIES PERFORMED ON PREVIOUS CULTURE WITHIN THE LAST 5 DAYS. Performed at [facility], [place] [address], [place], [place] [address]    Report Status 06/13/2019 FINAL  Final  Blood culture (routine x 2)     Status: Abnormal   Collection Time: 06/10/19  1:35 PM   Specimen: BLOOD  Result Value Ref Range Status   Specimen Description BLOOD BLOOD RIGHT FOREARM  Final   Special Requests   Final    BOTTLES DRAWN AEROBIC AND ANAEROBIC Blood Culture adequate volume   Culture  Setup Time   Final    GRAM POSITIVE COCCI AEROBIC BOTTLE ONLY CRITICAL VALUE NOTED.  VALUE IS CONSISTENT WITH PREVIOUSLY REPORTED AND CALLED VALUE. Performed at [facility], [place] [address] [place], [place] [address]    Culture STAPHYLOCOCCUS AUREUS (A)  Final   Report Status 06/13/2019 FINAL  Final   Organism ID, Bacteria STAPHYLOCOCCUS AUREUS  Final      Susceptibility   Staphylococcus aureus - MIC*    CIPROFLOXACIN <=0.5 SENSITIVE Sensitive     ERYTHROMYCIN <=0.25 SENSITIVE Sensitive     GENTAMICIN <=0.5 SENSITIVE Sensitive     OXACILLIN 0.5 SENSITIVE Sensitive     TETRACYCLINE >=16 RESISTANT Resistant     VANCOMYCIN 1 SENSITIVE Sensitive     TRIMETH/SULFA <=10 SENSITIVE Sensitive     CLINDAMYCIN <=0.25 SENSITIVE Sensitive     RIFAMPIN <=0.5 SENSITIVE Sensitive     Inducible Clindamycin NEGATIVE Sensitive     * STAPHYLOCOCCUS AUREUS  Blood Culture ID Panel (Reflexed)     Status: Abnormal   Collection Time: 06/10/19  1:35 PM  Result Value Ref Range Status    Enterococcus species NOT DETECTED NOT DETECTED Final   Listeria monocytogenes NOT DETECTED  NOT DETECTED Final   Staphylococcus species DETECTED (A) NOT DETECTED Final    Comment: CRITICAL RESULT CALLED TO, READ BACK BY AND VERIFIED WITH: Andres Shad PharmD 14:55 06/11/19 ([email or web address])    Staphylococcus aureus (BCID) DETECTED (A) NOT DETECTED Final    Comment: Methicillin (oxacillin) susceptible Staphylococcus aureus (MSSA). Preferred therapy is anti staphylococcal beta lactam antibiotic (Cefazolin or Nafcillin), unless clinically contraindicated. CRITICAL RESULT CALLED TO, READ BACK BY AND VERIFIED WITH: Andres Shad PharmD 14:55 06/11/19 (wilsonm)    Methicillin resistance NOT DETECTED NOT DETECTED Final   Streptococcus species NOT DETECTED NOT DETECTED Final   Streptococcus agalactiae NOT DETECTED NOT DETECTED Final   Streptococcus pneumoniae NOT DETECTED NOT DETECTED Final   Streptococcus pyogenes NOT DETECTED NOT DETECTED Final   Acinetobacter baumannii NOT DETECTED NOT DETECTED Final   Enterobacteriaceae species NOT DETECTED NOT DETECTED Final   Enterobacter cloacae complex NOT DETECTED NOT DETECTED Final   Escherichia coli NOT DETECTED NOT DETECTED Final   Klebsiella oxytoca NOT DETECTED NOT DETECTED Final   Klebsiella pneumoniae NOT DETECTED NOT DETECTED Final   Proteus species NOT DETECTED NOT DETECTED Final   Serratia marcescens NOT DETECTED NOT DETECTED Final   Haemophilus influenzae NOT DETECTED NOT DETECTED Final   Neisseria meningitidis NOT DETECTED NOT DETECTED Final   Pseudomonas aeruginosa NOT DETECTED NOT DETECTED Final   Candida albicans NOT DETECTED NOT DETECTED Final   Candida glabrata NOT DETECTED NOT DETECTED Final   Candida krusei NOT DETECTED NOT DETECTED Final   Candida parapsilosis NOT DETECTED NOT DETECTED Final   Candida tropicalis NOT DETECTED NOT DETECTED Final    Comment: Performed at [facility], [address], [place], [place] [address]  Urine culture      Status: Abnormal   Collection Time: 06/10/19  1:45 PM   Specimen: Urine, Clean Catch  Result Value Ref Range Status   Specimen Description URINE, CLEAN CATCH  Final   Special Requests   Final    URINE, RANDOM Performed at [facility], [place] [address], [place], [place] [address]    Culture >=100,000 COLONIES/mL STAPHYLOCOCCUS AUREUS (A)  Final   Report Status 06/12/2019 FINAL  Final   Organism ID, Bacteria STAPHYLOCOCCUS AUREUS (A)  Final      Susceptibility   Staphylococcus aureus - MIC*    CIPROFLOXACIN <=0.5 SENSITIVE Sensitive     GENTAMICIN <=0.5 SENSITIVE Sensitive     NITROFURANTOIN 32 SENSITIVE Sensitive     OXACILLIN 0.5 SENSITIVE Sensitive     TETRACYCLINE >=16 RESISTANT Resistant     VANCOMYCIN 1 SENSITIVE Sensitive     TRIMETH/SULFA <=10 SENSITIVE Sensitive     CLINDAMYCIN <=0.25 SENSITIVE Sensitive     RIFAMPIN <=0.5 SENSITIVE Sensitive     Inducible Clindamycin NEGATIVE Sensitive     * >=100,000 COLONIES/mL STAPHYLOCOCCUS AUREUS  SARS CORONAVIRUS 2 (TAT 6-24 HRS) Nasopharyngeal Nasopharyngeal Swab     Status: None   Collection Time: 06/10/19  6:40 PM   Specimen: Nasopharyngeal Swab  Result Value Ref Range Status   SARS Coronavirus 2 NEGATIVE NEGATIVE Final    Comment: (NOTE) SARS-CoV-2 target nucleic acids are NOT DETECTED. The SARS-CoV-2 RNA is generally detectable in upper and lower respiratory specimens during the acute phase of infection. Negative results do not preclude SARS-CoV-2 infection, do not rule out co-infections with other pathogens, and should not be used as the sole basis for treatment or other patient management decisions. Negative results must be combined with clinical observations, patient history, and epidemiological  information. The expected result is Negative. Fact Sheet for Patients: SugarRoll.be Fact Sheet for Healthcare Providers: https://www.woods-mathews.com/ This test is not yet  approved or cleared by the [place] FDA and  has been authorized for detection and/or diagnosis of SARS-CoV-2 by FDA under an Emergency Use Authorization (EUA). This EUA will remain  in effect (meaning this test can be used) for the duration of the COVID-19 declaration under Section 56 4(b)(1) of the Act, 21 U.S.C. section 360bbb-3(b)(1), unless the authorization is terminated or revoked sooner. Performed at [facility], [place] [address] [place], [place] [address]   Culture, blood (routine x 2)     Status: None (Preliminary result)   Collection Time: 06/11/19  4:35 PM   Specimen: BLOOD LEFT HAND  Result Value Ref Range Status   Specimen Description BLOOD LEFT HAND  Final   Special Requests   Final    BOTTLES DRAWN AEROBIC AND ANAEROBIC Blood Culture adequate volume   Culture   Final    NO GROWTH 3 DAYS Performed at [facility], [place] [address], [place], [place] [address]    Report Status PENDING  Incomplete  Culture, blood (routine x 2)     Status: None (Preliminary result)   Collection Time: 06/11/19  4:40 PM   Specimen: BLOOD RIGHT HAND  Result Value Ref Range Status   Specimen Description BLOOD RIGHT HAND  Final   Special Requests   Final    BOTTLES DRAWN AEROBIC ONLY Blood Culture adequate volume   Culture   Final    NO GROWTH 3 DAYS Performed at [facility], [place] [address] [place], [place] [address]    Report Status PENDING  Incomplete     Radiology Studies: Mr Hip Left Wo Contrast  Result Date: 06/13/2019 CLINICAL DATA:  Hip pain and swelling, question of osteomyelitis EXAM: MR OF THE LEFT HIP WITHOUT CONTRAST TECHNIQUE: Multiplanar, multisequence MR imaging was performed. No intravenous contrast was administered. COMPARISON:  CT 06/10/2019 FINDINGS: Bones: There is no evidence of acute fracture, dislocation or avascular necrosis. No areas of cortical destruction or periosteal reaction is seen. The visualized bony pelvis appears normal. The  visualized sacroiliac joints and symphysis pubis appear normal. Articular cartilage and labrum Articular cartilage: No focal chondral defect or subchondral signal abnormality identified. Labrum: There is no gross labral tear or paralabral abnormality. Joint or bursal effusion Joint effusion: No significant hip joint effusion. Bursae: No focal periarticular fluid collection. Muscles and tendons Muscles and tendons: There is a large multilocular cystic mass seen within the gluteal musculature measuring approximately 22 cm in craniocaudal dimension. The loculated fluid collection extends in anteriorly into the trochanteric bursa. There is scattered debris seen within the collection. Increased feathery signal seen throughout the remainder of the muscles surrounding the hip. This is most notable within the abductor, piriformis, and psoas musculature. A however appear to be intact. There is limited visualization of the gluteal tendons, however they do appear to be intact. The hamstrings and iliopsoas tendons are intact. Other findings Miscellaneous: A moderate amount of ascites is present within the deep pelvis. IMPRESSION: 1. Large multilocular septated cystic mass within the gluteal musculature extending into the trochanteric bursa which could chronic organizing hematoma/seroma or intramuscular abscess. No evidence of osteomyelitis. 2. Diffuse muscular edema surrounding the hip, most notable within the psoas and adductor musculature 3. Moderate pelvic ascites. Electronically Signed   By: Prudencio Pair M.D.   On: 06/13/2019 20:52    Scheduled Meds: .  cholecalciferol  2,000 Units Oral Daily  . heparin  5,000 Units Subcutaneous Q8H  . insulin aspart  0-15 Units Subcutaneous TID WC  . insulin aspart  0-5 Units Subcutaneous QHS  . insulin aspart  8 Units Subcutaneous TID WC  . insulin glargine  25 Units Subcutaneous Daily  . multivitamin with minerals  1 tablet Oral Daily  . Ensure Max Protein  11 oz Oral Daily  .  vitamin C  1,000 mg Oral Daily   Continuous Infusions: .  ceFAZolin (ANCEF) IV 2 g (06/14/19 1332)     LOS: 4 days   Marylu Lund, MD [facility] Pager On Amion  If 7PM-7AM, please contact night-coverage 06/14/2019, 3:01 PM

## 2019-06-14 NOTE — Progress Notes (Signed)
   [place] has been requested to perform a transesophageal echocardiogram on [facility] for bacteremia.  After careful review of history and examination, the risks and benefits of transesophageal echocardiogram have been explained including risks of esophageal damage, perforation (1:10,000 risk), bleeding, pharyngeal hematoma as well as other potential complications associated with conscious sedation including aspiration, arrhythmia, respiratory failure and death. Alternatives to treatment were discussed, questions were answered. Patient is willing to proceed.   Procedure scheduled for Monday 06/16/2019 at 12:00pm with Dr. Meda Coffee. Will place orders and make patient NPO at midnight.   Darreld Mclean, PA-C 06/14/2019 1:46 PM

## 2019-06-14 NOTE — Progress Notes (Signed)
Pt has developed a rash to right upper arm. States, "I didn't even realize that was there." Denies SOB, no complaints. VSS. Jeannette Corpus, NP notified. Will continue to monitor.

## 2019-06-15 LAB — CBC
HCT: 33.3 % — ABNORMAL LOW (ref 39.0–52.0)
Hemoglobin: 11.4 g/dL — ABNORMAL LOW (ref 13.0–17.0)
MCH: 30.6 pg (ref 26.0–34.0)
MCHC: 34.2 g/dL (ref 30.0–36.0)
MCV: 89.3 fL (ref 80.0–100.0)
Platelets: 123 10*3/uL — ABNORMAL LOW (ref 150–400)
RBC: 3.73 MIL/uL — ABNORMAL LOW (ref 4.22–5.81)
RDW: 14.3 % (ref 11.5–15.5)
WBC: 14.8 10*3/uL — ABNORMAL HIGH (ref 4.0–10.5)
nRBC: 0 % (ref 0.0–0.2)

## 2019-06-15 LAB — COMPREHENSIVE METABOLIC PANEL
ALT: 29 U/L (ref 0–44)
AST: 82 U/L — ABNORMAL HIGH (ref 15–41)
Albumin: 1.2 g/dL — ABNORMAL LOW (ref 3.5–5.0)
Alkaline Phosphatase: 292 U/L — ABNORMAL HIGH (ref 38–126)
Anion gap: 7 (ref 5–15)
BUN: 17 mg/dL (ref 6–20)
CO2: 22 mmol/L (ref 22–32)
Calcium: 7.8 mg/dL — ABNORMAL LOW (ref 8.9–10.3)
Chloride: 100 mmol/L (ref 98–111)
Creatinine, Ser: 0.62 mg/dL (ref 0.61–1.24)
GFR calc Af Amer: >60 mL/min (ref >60)
GFR calc non Af Amer: >60 mL/min (ref >60)
Glucose, Bld: 195 mg/dL — ABNORMAL HIGH (ref 70–99)
Potassium: 4.4 mmol/L (ref 3.5–5.1)
Sodium: 129 mmol/L — ABNORMAL LOW (ref 135–145)
Total Bilirubin: 1.4 mg/dL — ABNORMAL HIGH (ref 0.3–1.2)
Total Protein: 5.7 g/dL — ABNORMAL LOW (ref 6.5–8.1)

## 2019-06-15 LAB — GLUCOSE, CAPILLARY
Glucose-Capillary: 168 mg/dL — ABNORMAL HIGH (ref 70–99)
Glucose-Capillary: 139 mg/dL — ABNORMAL HIGH (ref 70–99)
Glucose-Capillary: 188 mg/dL — ABNORMAL HIGH (ref 70–99)
Glucose-Capillary: 228 mg/dL — ABNORMAL HIGH (ref 70–99)

## 2019-06-15 MED ORDER — BISACODYL 10 MG RE SUPP: 10.0000 mg | Freq: Once | RECTAL | Status: DC

## 2019-06-15 MED ORDER — SORBITOL 70 % SOLN: 30.0000 mL | Status: AC

## 2019-06-15 MED ORDER — LACTULOSE 10 GM/15ML PO SOLN
20.0000 g | Freq: Once | ORAL | Status: AC
  Administered 2019-06-15: 20 g via ORAL
  Filled 2019-06-15: qty 30

## 2019-06-15 MED ORDER — FUROSEMIDE 10 MG/ML IJ SOLN
40.0000 mg | Freq: Once | INTRAMUSCULAR | Status: AC
  Administered 2019-06-15: 40 mg via INTRAVENOUS
  Filled 2019-06-15: qty 4

## 2019-06-15 MED ORDER — OXYCODONE-ACETAMINOPHEN 5-325 MG PO TABS
1.0000 | ORAL_TABLET | ORAL | Status: DC | PRN
  Administered 2019-06-15 – 2019-06-29 (×43): 1 via ORAL
  Filled 2019-06-15 (×47): qty 1

## 2019-06-15 NOTE — Progress Notes (Signed)
ID PROGRESS NOTE  53yo M withTT2DM, poorly controlled with morbid obesity found to have disseminated MSSA infection with bacteremia, uti, plus left gluteal deep tissue abscess extending into trochanteric bursa. Currently on cefazolin. TTE no vegetation seen  24hr: afebrile, blood cx NGTD from 12/2. MRI from 12/4 showed a large multilocular cystic mass seen  within the gluteal musculature measuring approximately 22 cm in size   Labs: Lab Results  Component Value Date   WBC 14.8 (H) 06/15/2019   HGB 11.4 (L) 06/15/2019   HCT 33.3 (L) 06/15/2019   MCV 89.3 06/15/2019   PLT 123 (L) 06/15/2019     STAPHYLOCOCCUS AUREUSAbnormal    Report Status 06/13/2019 FINAL   Organism ID, Bacteria STAPHYLOCOCCUS AUREUS   Resulting Agency [facility]  Susceptibility   Staphylococcus aureus    MIC    CIPROFLOXACIN <=0.5 SENSI... Sensitive    CLINDAMYCIN <=0.25 SENS... Sensitive    ERYTHROMYCIN <=0.25 SENS... Sensitive    GENTAMICIN <=0.5 SENSI... Sensitive    Inducible Clindamycin NEGATIVE  Sensitive    OXACILLIN 0.5 SENSITIVE  Sensitive    RIFAMPIN <=0.5 SENSI... Sensitive    TETRACYCLINE >=16 RESIST... Resistant    TRIMETH/SULFA <=10 SENSIT... Sensitive    VANCOMYCIN 1 SENSITIVE  Sensitive      A/P: MSSA disseminated infection  - plan for TEE tomorrow to rule out NV endocarditis - recommend orthopedics evaluation to do debridement of gluteal abscess for source control - leukocytosis appears to be improving  Kaytlyn Din B. [place] [facility] [phone]

## 2019-06-15 NOTE — Progress Notes (Signed)
PROGRESS NOTE    Robert Sullivan  CNO:[id number] DOB: Feb 17, 1966 DOA: 06/10/2019 PCP: Cathleen Corti, PA-C    Brief Narrative:  53 y.o. male with history of uncontrolled type 2 diabetes mellitus, morbid obesity was admitted to [facility] from 11/2-11/7 with E. coli UTI, uncontrolled diabetes and a fall that resulted in a left gluteal hematoma, he was treated with IV antibiotics, discharged home on Bactrim, completed course and also on insulin. -He felt well initially after discharge, in the last 7 to 10 days he started having fevers and chills, profuse sweats, 5 to 6 days ago he started having increased pain and swelling at the site of his gluteal hematoma, he also noticed that his urine was darker but denied any dysuria or change in odor presented to the emergency room today with weakness, worsening left hip pain, fevers chills and sweats. -In the ED he was noted to have a sodium of 131, CBG of 380, albumin of 1.8, ALP of 224,  lactic acid of 5.5, white count of 20,000.  In the ED he was noted to have an elevated D-dimer subsequently EDP proceeded with CT angiogram which was negative for pulmonary embolism, his UA was significantly abnormal with cloudy urine, positive nitrite, leukocyte esterase, numerous WBC and bacteria.  In addition noted to have redness and tenderness of his left gluteal region  Assessment & Plan:   Active Problems:   Acute lower UTI   Sepsis ([place])   Uncontrolled diabetes mellitus (HCC)   Obesity, Class III, BMI 40-49.9 (morbid obesity) (HCC)   Cellulitis, gluteal, left  Sepsis secondary to MSSA bacteremia -Blood cx pos for MSSA, ID now consulted with recommendation for 2d echo to r/o endocarditis, neg findings -Will benefit from TEE, tentatively planned for 12/7 -currently on ancef per ID -CT reviewed, no drainable abscess identified in L hip. Have ordered follow up MRI L hip, reviewed, findings of large multilocular septated cystic mass within gluteal musculature extending  in to the trochanteric bursa which could be chronic organizing hematoma/seroma or intramuscular abscess -Lactate had improved  Urinary tract infection -Continued on ancef per above -Presenting UA suggestive of UTI, however, pt denies dysuria or other symptoms  Left gluteal myositis, pain -Per above, pt is continued on ancef -Marked pain with findings suggestive of myositis on CT -f/u MRI findings per above -Pt reports symptoms seem to be improving. Pt better able to ambulate  Morbid obesity -BMI of 47 -diet/lifestyle modification recommended  Uncontrolled type 2 diabetes mellitus -Hemoglobin A1c was 11 recently -Resume Lantus and NovoLog premeal and sliding scale insulin -remains stable at this time  Suspected cirrhosis -Noted on imaging, patient denies any alcohol use currently but did drink regularly in the past, suspect NASH/fatty liver disease is likely the primary culprit -Hep C neg -GI follow up recommended on d/c  DVT prophylaxis: Heparin subQ Code Status: Full Family Communication: Pt in room, family not at bedside Disposition Plan: Uncertain at this time  Consultants:   ID  Cardiology  Procedures:     Antimicrobials: Anti-infectives (From admission, onward)   Start     Dose/Rate Route Frequency Ordered Stop   06/11/19 2200  ceFAZolin (ANCEF) IVPB 2g/100 mL premix     2 g 200 mL/hr over 30 Minutes Intravenous Every 8 hours 06/11/19 1456     06/11/19 0200  vancomycin (VANCOCIN) 1,750 mg in sodium chloride 0.9 % 500 mL IVPB  Status:  Discontinued     1,750 mg 250 mL/hr over 120 Minutes Intravenous Every 12  hours 06/10/19 1257 06/11/19 1456   06/10/19 2200  ceFEPIme (MAXIPIME) 2 g in sodium chloride 0.9 % 100 mL IVPB  Status:  Discontinued     2 g 200 mL/hr over 30 Minutes Intravenous Every 8 hours 06/10/19 1257 06/10/19 1740   06/10/19 1745  cefTRIAXone (ROCEPHIN) 1 g in sodium chloride 0.9 % 100 mL IVPB  Status:  Discontinued     1 g 200 mL/hr over  30 Minutes Intravenous Every 24 hours 06/10/19 1740 06/11/19 1456   06/10/19 1245  ceFEPIme (MAXIPIME) 2 g in sodium chloride 0.9 % 100 mL IVPB     2 g 200 mL/hr over 30 Minutes Intravenous  Once 06/10/19 1239 06/10/19 1411   06/10/19 1245  vancomycin (VANCOCIN) IVPB 1000 mg/200 mL premix  Status:  Discontinued     1,000 mg 200 mL/hr over 60 Minutes Intravenous  Once 06/10/19 1239 06/10/19 1241   06/10/19 1245  vancomycin (VANCOCIN) 2,500 mg in sodium chloride 0.9 % 500 mL IVPB     2,500 mg 250 mL/hr over 120 Minutes Intravenous  Once 06/10/19 1241 06/10/19 1635      Subjective: States feeling somewhat better  Objective: Vitals:   06/15/19 0100 06/15/19 0446 06/15/19 0840 06/15/19 1337  BP:  118/62 125/66   Pulse:  91 86 (!) 108  Resp:  12 18   Temp:  98.6 F (37 C) 98.5 F (36.9 C)   TempSrc:  Oral Oral   SpO2:  94% 96%   Weight: (!) 173 kg     Height:        Intake/Output Summary (Last 24 hours) at 06/15/2019 1407 Last data filed at 06/15/2019 1338 Gross per 24 hour  Intake 1455 ml  Output 980 ml  Net 475 ml   Filed Weights   06/11/19 2134 06/13/19 2025 06/15/19 0100  Weight: (!) 159.5 kg (!) 166 kg (!) 173 kg    Examination: General exam: Awake, laying in bed, in nad Respiratory system: Normal respiratory effort, no wheezing Cardiovascular system: regular rate, s1, s2 Gastrointestinal system: Soft, nondistended, positive BS Central nervous system: CN2-12 grossly intact, strength intact Extremities: Perfused, no clubbing Skin: Normal skin turgor, no notable skin lesions seen Psychiatry: Mood normal // no visual hallucinations   Data Reviewed: I have personally reviewed following labs and imaging studies  CBC: Recent Labs  Lab 06/10/19 1129 06/11/19 0721 06/12/19 0318 06/13/19 0236 06/14/19 0442 06/15/19 0545  WBC 20.4* 16.5* 16.6* 18.3* 19.8* 14.8*  NEUTROABS 17.6*  --   --   --   --   --   HGB 13.6 11.5* 12.1* 12.2* 12.0* 11.4*  HCT 41.0 34.6* 35.3*  35.4* 35.0* 33.3*  MCV 92.3 91.5 89.4 89.6 89.3 89.3  PLT 198 150 150 148* 148* 793*   Basic Metabolic Panel: Recent Labs  Lab 06/11/19 0721 06/12/19 0318 06/13/19 0236 06/14/19 0442 06/15/19 0545  NA 133* 130* 131* 132* 129*  K 5.1 4.6 5.0 4.6 4.4  CL 102 101 101 101 100  CO2 20* 21* 20* 22 22  GLUCOSE 196* 201* 173* 186* 195*  BUN 19 24* 23* 20 17  CREATININE 0.87 0.90 0.78 0.76 0.62  CALCIUM 7.8* 8.0* 8.2* 7.9* 7.8*   GFR: Estimated Creatinine Clearance: 183.2 mL/min (by C-G formula based on SCr of 0.62 mg/dL). Liver Function Tests: Recent Labs  Lab 06/11/19 0721 06/12/19 0318 06/13/19 0236 06/14/19 0442 06/15/19 0545  AST 67* 83* 82* 88* 82*  ALT 39 42 37 32 29  ALKPHOS  184* 238* 272* 282* 292*  BILITOT 1.5* 1.2 1.2 1.5* 1.4*  PROT 5.8* 6.2* 6.0* 6.2* 5.7*  ALBUMIN 1.4* 1.4* 1.3* 1.3* 1.2*   No results for input(s): LIPASE, AMYLASE in the last 168 hours. No results for input(s): AMMONIA in the last 168 hours. Coagulation Profile: Recent Labs  Lab 06/10/19 1345  INR 1.4*   Cardiac Enzymes: Recent Labs  Lab 06/10/19 1129 06/12/19 0318  CKTOTAL 23* 15*   BNP (last 3 results) No results for input(s): PROBNP in the last 8760 hours. HbA1C: No results for input(s): HGBA1C in the last 72 hours. CBG: Recent Labs  Lab 06/14/19 1111 06/14/19 1625 06/14/19 2128 06/15/19 0658 06/15/19 1107  GLUCAP 186* 172* 168* 168* 139*   Lipid Profile: No results for input(s): CHOL, HDL, LDLCALC, TRIG, CHOLHDL, LDLDIRECT in the last 72 hours. Thyroid Function Tests: No results for input(s): TSH, T4TOTAL, FREET4, T3FREE, THYROIDAB in the last 72 hours. Anemia Panel: No results for input(s): VITAMINB12, FOLATE, FERRITIN, TIBC, IRON, RETICCTPCT in the last 72 hours. Sepsis Labs: Recent Labs  Lab 06/10/19 1129 06/10/19 1345  LATICACIDVEN 5.5* 2.6*    Recent Results (from the past 240 hour(s))  Blood culture (routine x 2)     Status: Abnormal   Collection Time:  06/10/19  1:25 PM   Specimen: BLOOD  Result Value Ref Range Status   Specimen Description BLOOD LEFT ANTECUBITAL  Final   Special Requests   Final    BOTTLES DRAWN AEROBIC AND ANAEROBIC Blood Culture adequate volume   Culture  Setup Time   Final    GRAM POSITIVE COCCI IN CLUSTERS IN BOTH AEROBIC AND ANAEROBIC BOTTLES CRITICAL RESULT CALLED TO, READ BACK BY AND VERIFIED WITH: K. AMEND PHARMD, AT 0354 06/11/19 BY D. VANHOOK    Culture (A)  Final    STAPHYLOCOCCUS AUREUS SUSCEPTIBILITIES PERFORMED ON PREVIOUS CULTURE WITHIN THE LAST 5 DAYS. Performed at [facility], [place] [address] [place], [place] [address]    Report Status 06/13/2019 FINAL  Final  Blood culture (routine x 2)     Status: Abnormal   Collection Time: 06/10/19  1:35 PM   Specimen: BLOOD  Result Value Ref Range Status   Specimen Description BLOOD BLOOD RIGHT FOREARM  Final   Special Requests   Final    BOTTLES DRAWN AEROBIC AND ANAEROBIC Blood Culture adequate volume   Culture  Setup Time   Final    GRAM POSITIVE COCCI AEROBIC BOTTLE ONLY CRITICAL VALUE NOTED.  VALUE IS CONSISTENT WITH PREVIOUSLY REPORTED AND CALLED VALUE. Performed at [facility], [place] [address] [place], [place] [address]    Culture STAPHYLOCOCCUS AUREUS (A)  Final   Report Status 06/13/2019 FINAL  Final   Organism ID, Bacteria STAPHYLOCOCCUS AUREUS  Final      Susceptibility   Staphylococcus aureus - MIC*    CIPROFLOXACIN <=0.5 SENSITIVE Sensitive     ERYTHROMYCIN <=0.25 SENSITIVE Sensitive     GENTAMICIN <=0.5 SENSITIVE Sensitive     OXACILLIN 0.5 SENSITIVE Sensitive     TETRACYCLINE >=16 RESISTANT Resistant     VANCOMYCIN 1 SENSITIVE Sensitive     TRIMETH/SULFA <=10 SENSITIVE Sensitive     CLINDAMYCIN <=0.25 SENSITIVE Sensitive     RIFAMPIN <=0.5 SENSITIVE Sensitive     Inducible Clindamycin NEGATIVE Sensitive     * STAPHYLOCOCCUS AUREUS  Blood Culture ID Panel (Reflexed)     Status: Abnormal   Collection Time: 06/10/19   1:35 PM  Result Value Ref Range  Status   Enterococcus species NOT DETECTED NOT DETECTED Final   Listeria monocytogenes NOT DETECTED NOT DETECTED Final   Staphylococcus species DETECTED (A) NOT DETECTED Final    Comment: CRITICAL RESULT CALLED TO, READ BACK BY AND VERIFIED WITH: Andres Shad PharmD 14:55 06/11/19 ([email or web address])    Staphylococcus aureus (BCID) DETECTED (A) NOT DETECTED Final    Comment: Methicillin (oxacillin) susceptible Staphylococcus aureus (MSSA). Preferred therapy is anti staphylococcal beta lactam antibiotic (Cefazolin or Nafcillin), unless clinically contraindicated. CRITICAL RESULT CALLED TO, READ BACK BY AND VERIFIED WITH: Andres Shad PharmD 14:55 06/11/19 ([email or web address])    Methicillin resistance NOT DETECTED NOT DETECTED Final   Streptococcus species NOT DETECTED NOT DETECTED Final   Streptococcus agalactiae NOT DETECTED NOT DETECTED Final   Streptococcus pneumoniae NOT DETECTED NOT DETECTED Final   Streptococcus pyogenes NOT DETECTED NOT DETECTED Final   Acinetobacter baumannii NOT DETECTED NOT DETECTED Final   Enterobacteriaceae species NOT DETECTED NOT DETECTED Final   Enterobacter cloacae complex NOT DETECTED NOT DETECTED Final   Escherichia coli NOT DETECTED NOT DETECTED Final   Klebsiella oxytoca NOT DETECTED NOT DETECTED Final   Klebsiella pneumoniae NOT DETECTED NOT DETECTED Final   Proteus species NOT DETECTED NOT DETECTED Final   Serratia marcescens NOT DETECTED NOT DETECTED Final   Haemophilus influenzae NOT DETECTED NOT DETECTED Final   Neisseria meningitidis NOT DETECTED NOT DETECTED Final   Pseudomonas aeruginosa NOT DETECTED NOT DETECTED Final   Candida albicans NOT DETECTED NOT DETECTED Final   Candida glabrata NOT DETECTED NOT DETECTED Final   Candida krusei NOT DETECTED NOT DETECTED Final   Candida parapsilosis NOT DETECTED NOT DETECTED Final   Candida tropicalis NOT DETECTED NOT DETECTED Final    Comment: Performed at [facility], [address], [place], [place] [address]  Urine culture     Status: Abnormal   Collection Time: 06/10/19  1:45 PM   Specimen: Urine, Clean Catch  Result Value Ref Range Status   Specimen Description URINE, CLEAN CATCH  Final   Special Requests   Final    URINE, RANDOM Performed at [facility], [place] [address], [place], [place] [address]    Culture >=100,000 COLONIES/mL STAPHYLOCOCCUS AUREUS (A)  Final   Report Status 06/12/2019 FINAL  Final   Organism ID, Bacteria STAPHYLOCOCCUS AUREUS (A)  Final      Susceptibility   Staphylococcus aureus - MIC*    CIPROFLOXACIN <=0.5 SENSITIVE Sensitive     GENTAMICIN <=0.5 SENSITIVE Sensitive     NITROFURANTOIN 32 SENSITIVE Sensitive     OXACILLIN 0.5 SENSITIVE Sensitive     TETRACYCLINE >=16 RESISTANT Resistant     VANCOMYCIN 1 SENSITIVE Sensitive     TRIMETH/SULFA <=10 SENSITIVE Sensitive     CLINDAMYCIN <=0.25 SENSITIVE Sensitive     RIFAMPIN <=0.5 SENSITIVE Sensitive     Inducible Clindamycin NEGATIVE Sensitive     * >=100,000 COLONIES/mL STAPHYLOCOCCUS AUREUS  SARS CORONAVIRUS 2 (TAT 6-24 HRS) Nasopharyngeal Nasopharyngeal Swab     Status: None   Collection Time: 06/10/19  6:40 PM   Specimen: Nasopharyngeal Swab  Result Value Ref Range Status   SARS Coronavirus 2 NEGATIVE NEGATIVE Final    Comment: (NOTE) SARS-CoV-2 target nucleic acids are NOT DETECTED. The SARS-CoV-2 RNA is generally detectable in upper and lower respiratory specimens during the acute phase of infection. Negative results do not preclude SARS-CoV-2 infection, do not rule out co-infections with other pathogens, and should not be used as the sole basis for treatment or  other patient management decisions. Negative results must be combined with clinical observations, patient history, and epidemiological information. The expected result is Negative. Fact Sheet for Patients: SugarRoll.be Fact Sheet for Healthcare Providers:  https://www.woods-mathews.com/ This test is not yet approved or cleared by the [place] FDA and  has been authorized for detection and/or diagnosis of SARS-CoV-2 by FDA under an Emergency Use Authorization (EUA). This EUA will remain  in effect (meaning this test can be used) for the duration of the COVID-19 declaration under Section 56 4(b)(1) of the Act, 21 U.S.C. section 360bbb-3(b)(1), unless the authorization is terminated or revoked sooner. Performed at [facility], [place] [address] [place], [place] [address]   Culture, blood (routine x 2)     Status: None (Preliminary result)   Collection Time: 06/11/19  4:35 PM   Specimen: BLOOD LEFT HAND  Result Value Ref Range Status   Specimen Description BLOOD LEFT HAND  Final   Special Requests   Final    BOTTLES DRAWN AEROBIC AND ANAEROBIC Blood Culture adequate volume   Culture   Final    NO GROWTH 4 DAYS Performed at [facility], [place] [address], [place], [place] [address]    Report Status PENDING  Incomplete  Culture, blood (routine x 2)     Status: None (Preliminary result)   Collection Time: 06/11/19  4:40 PM   Specimen: BLOOD RIGHT HAND  Result Value Ref Range Status   Specimen Description BLOOD RIGHT HAND  Final   Special Requests   Final    BOTTLES DRAWN AEROBIC ONLY Blood Culture adequate volume   Culture   Final    NO GROWTH 4 DAYS Performed at [facility], [place] [address], [place], [place] [address]    Report Status PENDING  Incomplete     Radiology Studies: Mr Hip Left Wo Contrast  Result Date: 06/13/2019 CLINICAL DATA:  Hip pain and swelling, question of osteomyelitis EXAM: MR OF THE LEFT HIP WITHOUT CONTRAST TECHNIQUE: Multiplanar, multisequence MR imaging was performed. No intravenous contrast was administered. COMPARISON:  CT 06/10/2019 FINDINGS: Bones: There is no evidence of acute fracture, dislocation or avascular necrosis. No areas of cortical destruction or periosteal  reaction is seen. The visualized bony pelvis appears normal. The visualized sacroiliac joints and symphysis pubis appear normal. Articular cartilage and labrum Articular cartilage: No focal chondral defect or subchondral signal abnormality identified. Labrum: There is no gross labral tear or paralabral abnormality. Joint or bursal effusion Joint effusion: No significant hip joint effusion. Bursae: No focal periarticular fluid collection. Muscles and tendons Muscles and tendons: There is a large multilocular cystic mass seen within the gluteal musculature measuring approximately 22 cm in craniocaudal dimension. The loculated fluid collection extends in anteriorly into the trochanteric bursa. There is scattered debris seen within the collection. Increased feathery signal seen throughout the remainder of the muscles surrounding the hip. This is most notable within the abductor, piriformis, and psoas musculature. A however appear to be intact. There is limited visualization of the gluteal tendons, however they do appear to be intact. The hamstrings and iliopsoas tendons are intact. Other findings Miscellaneous: A moderate amount of ascites is present within the deep pelvis. IMPRESSION: 1. Large multilocular septated cystic mass within the gluteal musculature extending into the trochanteric bursa which could chronic organizing hematoma/seroma or intramuscular abscess. No evidence of osteomyelitis. 2. Diffuse muscular edema surrounding the hip, most notable within the psoas and adductor musculature 3. Moderate pelvic ascites. Electronically Signed  By: Prudencio Pair M.D.   On: 06/13/2019 20:52    Scheduled Meds: . cholecalciferol  2,000 Units Oral Daily  . heparin  5,000 Units Subcutaneous Q8H  . insulin aspart  0-15 Units Subcutaneous TID WC  . insulin aspart  0-5 Units Subcutaneous QHS  . insulin aspart  8 Units Subcutaneous TID WC  . insulin glargine  25 Units Subcutaneous Daily  . multivitamin with minerals   1 tablet Oral Daily  . Ensure Max Protein  11 oz Oral Daily  . vitamin C  1,000 mg Oral Daily   Continuous Infusions: .  ceFAZolin (ANCEF) IV 2 g (06/15/19 0615)     LOS: 5 days   Marylu Lund, MD [facility] Pager On Amion  If 7PM-7AM, please contact night-coverage 06/15/2019, 2:07 PM

## 2019-06-15 NOTE — Progress Notes (Signed)
Notified MD Wyline Copas that pt refuses suppository and sorbitol. Pt is on [facility] actively having a BM.   Paulla Fore, RN, BSN

## 2019-06-15 NOTE — Plan of Care (Signed)
  Problem: Activity: Goal: Risk for activity intolerance will decrease Outcome: Not Progressing   

## 2019-06-15 NOTE — H&P (View-Only) (Signed)
ID PROGRESS NOTE  53yo M withTT2DM, poorly controlled with morbid obesity found to have disseminated MSSA infection with bacteremia, uti, plus left gluteal deep tissue abscess extending into trochanteric bursa. Currently on cefazolin. TTE no vegetation seen  24hr: afebrile, blood cx NGTD from 12/2. MRI from 12/4 showed a large multilocular cystic mass seen  within the gluteal musculature measuring approximately 22 cm in size   Labs: Lab Results  Component Value Date   WBC 14.8 (H) 06/15/2019   HGB 11.4 (L) 06/15/2019   HCT 33.3 (L) 06/15/2019   MCV 89.3 06/15/2019   PLT 123 (L) 06/15/2019     STAPHYLOCOCCUS AUREUSAbnormal    Report Status 06/13/2019 FINAL   Organism ID, Bacteria STAPHYLOCOCCUS AUREUS   Resulting Agency [facility]  Susceptibility   Staphylococcus aureus    MIC    CIPROFLOXACIN <=0.5 SENSI... Sensitive    CLINDAMYCIN <=0.25 SENS... Sensitive    ERYTHROMYCIN <=0.25 SENS... Sensitive    GENTAMICIN <=0.5 SENSI... Sensitive    Inducible Clindamycin NEGATIVE  Sensitive    OXACILLIN 0.5 SENSITIVE  Sensitive    RIFAMPIN <=0.5 SENSI... Sensitive    TETRACYCLINE >=16 RESIST... Resistant    TRIMETH/SULFA <=10 SENSIT... Sensitive    VANCOMYCIN 1 SENSITIVE  Sensitive      A/P: MSSA disseminated infection  - plan for TEE tomorrow to rule out [place] endocarditis - recommend orthopedics evaluation to do debridement of gluteal abscess for source control - leukocytosis appears to be improving  Gerrianne Aydelott B. [place] [facility] [phone]

## 2019-06-16 ENCOUNTER — Inpatient Hospital Stay (HOSPITAL_COMMUNITY): Payer: Self-pay | Admitting: Anesthesiology

## 2019-06-16 ENCOUNTER — Inpatient Hospital Stay (HOSPITAL_COMMUNITY): Payer: Self-pay

## 2019-06-16 ENCOUNTER — Encounter (HOSPITAL_COMMUNITY): Payer: Self-pay | Admitting: Emergency Medicine

## 2019-06-16 ENCOUNTER — Encounter (HOSPITAL_COMMUNITY)
Admission: EM | Disposition: A | Discharge status: Home Health | Payer: Self-pay | Source: Home / Self Care | Attending: Internal Medicine

## 2019-06-16 DIAGNOSIS — R7881 Bacteremia: Secondary | ICD-10-CM

## 2019-06-16 DIAGNOSIS — I340 Nonrheumatic mitral (valve) insufficiency: Secondary | ICD-10-CM

## 2019-06-16 DIAGNOSIS — L02416 Cutaneous abscess of left lower limb: Secondary | ICD-10-CM

## 2019-06-16 HISTORY — PX: TEE WITHOUT CARDIOVERSION: SHX5443

## 2019-06-16 LAB — PROTIME-INR
INR: 1.4 — ABNORMAL HIGH (ref 0.8–1.2)
Prothrombin Time: 16.8 seconds — ABNORMAL HIGH (ref 11.4–15.2)

## 2019-06-16 LAB — COMPREHENSIVE METABOLIC PANEL
ALT: 28 U/L (ref 0–44)
AST: 81 U/L — ABNORMAL HIGH (ref 15–41)
Albumin: 1.2 g/dL — ABNORMAL LOW (ref 3.5–5.0)
Alkaline Phosphatase: 299 U/L — ABNORMAL HIGH (ref 38–126)
Anion gap: 8 (ref 5–15)
BUN: 18 mg/dL (ref 6–20)
CO2: 23 mmol/L (ref 22–32)
Calcium: 7.9 mg/dL — ABNORMAL LOW (ref 8.9–10.3)
Chloride: 100 mmol/L (ref 98–111)
Creatinine, Ser: 0.72 mg/dL (ref 0.61–1.24)
GFR calc Af Amer: >60 mL/min (ref >60)
GFR calc non Af Amer: >60 mL/min (ref >60)
Glucose, Bld: 209 mg/dL — ABNORMAL HIGH (ref 70–99)
Potassium: 4 mmol/L (ref 3.5–5.1)
Sodium: 131 mmol/L — ABNORMAL LOW (ref 135–145)
Total Bilirubin: 1.2 mg/dL (ref 0.3–1.2)
Total Protein: 5.9 g/dL — ABNORMAL LOW (ref 6.5–8.1)

## 2019-06-16 LAB — CULTURE, BLOOD (ROUTINE X 2)
Culture: NO GROWTH
Culture: NO GROWTH
Special Requests: ADEQUATE
Special Requests: ADEQUATE

## 2019-06-16 LAB — CBC
HCT: 32.8 % — ABNORMAL LOW (ref 39.0–52.0)
Hemoglobin: 11.2 g/dL — ABNORMAL LOW (ref 13.0–17.0)
MCH: 30.4 pg (ref 26.0–34.0)
MCHC: 34.1 g/dL (ref 30.0–36.0)
MCV: 89.1 fL (ref 80.0–100.0)
Platelets: 123 10*3/uL — ABNORMAL LOW (ref 150–400)
RBC: 3.68 MIL/uL — ABNORMAL LOW (ref 4.22–5.81)
RDW: 14.3 % (ref 11.5–15.5)
WBC: 14.2 10*3/uL — ABNORMAL HIGH (ref 4.0–10.5)
nRBC: 0 % (ref 0.0–0.2)

## 2019-06-16 LAB — GLUCOSE, CAPILLARY
Glucose-Capillary: 182 mg/dL — ABNORMAL HIGH (ref 70–99)
Glucose-Capillary: 190 mg/dL — ABNORMAL HIGH (ref 70–99)
Glucose-Capillary: 188 mg/dL — ABNORMAL HIGH (ref 70–99)
Glucose-Capillary: 161 mg/dL — ABNORMAL HIGH (ref 70–99)

## 2019-06-16 SURGERY — ECHOCARDIOGRAM, TRANSESOPHAGEAL
Anesthesia: Monitor Anesthesia Care

## 2019-06-16 MED ORDER — LIDOCAINE HCL (CARDIAC) PF 100 MG/5ML IV SOSY
PREFILLED_SYRINGE | INTRAVENOUS | Status: DC | PRN
  Administered 2019-06-16: 20 mg via INTRATRACHEAL

## 2019-06-16 MED ORDER — PROPOFOL 10 MG/ML IV BOLUS
INTRAVENOUS | Status: DC | PRN
  Administered 2019-06-16 (×3): 10 mg via INTRAVENOUS

## 2019-06-16 MED ORDER — SODIUM CHLORIDE 0.9 % IV SOLN
INTRAVENOUS | Status: DC
  Administered 2019-06-16: 02:00:00 via INTRAVENOUS

## 2019-06-16 MED ORDER — PROPOFOL 500 MG/50ML IV EMUL
INTRAVENOUS | Status: DC | PRN
  Administered 2019-06-16: 100 ug/kg/min via INTRAVENOUS

## 2019-06-16 MED ORDER — LACTATED RINGERS IV SOLN
INTRAVENOUS | Status: DC | PRN
  Administered 2019-06-16: 13:00:00 via INTRAVENOUS

## 2019-06-16 NOTE — Consult Note (Addendum)
Reason for Consult:Left glute hematoma/abscess Referring Physician: Elby Sullivan is an 53 y.o. male.  HPI: Robert Sullivan has had ~5 week hx/o left hip pain. It all started the beginning of November when he was getting out of the bathtub and slipped with his left leg suddenly sliding out from under him. He didn't fall and didn't really have much pain at the time. However, the next morning when he got up and tried to stand he had tremendous left post hip pain and fell to the floor. His sons could not get him up and he was brought to the ED. MRI done at that time showed a moderately sized hematoma. He was also found to have systemic signs of infection but also had a UTI and, given the acuity, was not thought to be due to the hip pathology. He was discharged with oral abx once he had stabilized. Over the next 3 weeks he fell an additional 2-3 times 2/2 leg pain/weakness. He was readmitted after he began to have recurrent infectious signs. Repeat MRI showed increase in the fluid collection size with loculation and findings c/w infectious etiology and orthopedic surgery was consulted. He was an uncontrolled DM prior to the first admission but has been taking medication since then.  Past Medical History:  Diagnosis Date  . Arthritis 05/13/2019   knees  . Diabetes mellitus without complication ([place])   . Headache   . Sleep apnea   . Sleep apnea     History reviewed. No pertinent surgical history.  No family history on file.  Social History: denies EToh and drug use  Allergies:  Allergies  Allergen Reactions  . Sulfa Antibiotics     Total body rash    Medications: I have reviewed the patient's current medications.  Results for orders placed or performed during the hospital encounter of 06/10/19 (from the past 48 hour(s))  Glucose, capillary     Status: Abnormal   Collection Time: 06/14/19  4:25 PM  Result Value Ref Range   Glucose-Capillary 172 (H) 70 - 99 mg/dL  Glucose, capillary      Status: Abnormal   Collection Time: 06/14/19  9:28 PM  Result Value Ref Range   Glucose-Capillary 168 (H) 70 - 99 mg/dL  Comprehensive metabolic panel     Status: Abnormal   Collection Time: 06/15/19  5:45 AM  Result Value Ref Range   Sodium 129 (L) 135 - 145 mmol/L   Potassium 4.4 3.5 - 5.1 mmol/L   Chloride 100 98 - 111 mmol/L   CO2 22 22 - 32 mmol/L   Glucose, Bld 195 (H) 70 - 99 mg/dL   BUN 17 6 - 20 mg/dL   Creatinine, Ser 0.62 0.61 - 1.24 mg/dL   Calcium 7.8 (L) 8.9 - 10.3 mg/dL   Total Protein 5.7 (L) 6.5 - 8.1 g/dL   Albumin 1.2 (L) 3.5 - 5.0 g/dL   AST 82 (H) 15 - 41 U/L   ALT 29 0 - 44 U/L   Alkaline Phosphatase 292 (H) 38 - 126 U/L   Total Bilirubin 1.4 (H) 0.3 - 1.2 mg/dL   GFR calc non Af Amer >60 >60 mL/min   GFR calc Af Amer >60 >60 mL/min   Anion gap 7 5 - 15    Comment: Performed at [facility], [address] [place], [place] [address]  CBC     Status: Abnormal   Collection Time: 06/15/19  5:45 AM  Result Value Ref Range  WBC 14.8 (H) 4.0 - 10.5 K/uL   RBC 3.73 (L) 4.22 - 5.81 MIL/uL   Hemoglobin 11.4 (L) 13.0 - 17.0 g/dL   HCT 33.3 (L) 39.0 - 52.0 %   MCV 89.3 80.0 - 100.0 fL   MCH 30.6 26.0 - 34.0 pg   MCHC 34.2 30.0 - 36.0 g/dL   RDW 14.3 11.5 - 15.5 %   Platelets 123 (L) 150 - 400 K/uL   nRBC 0.0 0.0 - 0.2 %    Comment: Performed at [facility], [place] [address] [place], [place] [address]  Glucose, capillary     Status: Abnormal   Collection Time: 06/15/19  6:58 AM  Result Value Ref Range   Glucose-Capillary 168 (H) 70 - 99 mg/dL  Glucose, capillary     Status: Abnormal   Collection Time: 06/15/19 11:07 AM  Result Value Ref Range   Glucose-Capillary 139 (H) 70 - 99 mg/dL  Glucose, capillary     Status: Abnormal   Collection Time: 06/15/19  4:26 PM  Result Value Ref Range   Glucose-Capillary 188 (H) 70 - 99 mg/dL  Glucose, capillary     Status: Abnormal   Collection Time: 06/15/19  8:46 PM  Result Value Ref Range    Glucose-Capillary 228 (H) 70 - 99 mg/dL  Comprehensive metabolic panel     Status: Abnormal   Collection Time: 06/16/19  5:12 AM  Result Value Ref Range   Sodium 131 (L) 135 - 145 mmol/L   Potassium 4.0 3.5 - 5.1 mmol/L   Chloride 100 98 - 111 mmol/L   CO2 23 22 - 32 mmol/L   Glucose, Bld 209 (H) 70 - 99 mg/dL   BUN 18 6 - 20 mg/dL   Creatinine, Ser 0.72 0.61 - 1.24 mg/dL   Calcium 7.9 (L) 8.9 - 10.3 mg/dL   Total Protein 5.9 (L) 6.5 - 8.1 g/dL   Albumin 1.2 (L) 3.5 - 5.0 g/dL   AST 81 (H) 15 - 41 U/L   ALT 28 0 - 44 U/L   Alkaline Phosphatase 299 (H) 38 - 126 U/L   Total Bilirubin 1.2 0.3 - 1.2 mg/dL   GFR calc non Af Amer >60 >60 mL/min   GFR calc Af Amer >60 >60 mL/min   Anion gap 8 5 - 15    Comment: Performed at [facility], [place] [address] [place],  [address]  CBC     Status: Abnormal   Collection Time: 06/16/19  5:12 AM  Result Value Ref Range   WBC 14.2 (H) 4.0 - 10.5 K/uL   RBC 3.68 (L) 4.22 - 5.81 MIL/uL   Hemoglobin 11.2 (L) 13.0 - 17.0 g/dL   HCT 32.8 (L) 39.0 - 52.0 %   MCV 89.1 80.0 - 100.0 fL   MCH 30.4 26.0 - 34.0 pg   MCHC 34.1 30.0 - 36.0 g/dL   RDW 14.3 11.5 - 15.5 %   Platelets 123 (L) 150 - 400 K/uL   nRBC 0.0 0.0 - 0.2 %    Comment: Performed at [facility], [place] [address] [place], [place] [address]  Glucose, capillary     Status: Abnormal   Collection Time: 06/16/19  7:00 AM  Result Value Ref Range   Glucose-Capillary 182 (H) 70 - 99 mg/dL  Glucose, capillary     Status: Abnormal   Collection Time: 06/16/19  1:51 PM  Result Value Ref Range   Glucose-Capillary 190 (H) 70 - 99 mg/dL  No results found.  Review of Systems  Constitutional: Negative for weight loss.  HENT: Negative for ear discharge, ear pain, hearing loss and tinnitus.   Eyes: Negative for blurred vision, double vision, photophobia and pain.  Respiratory: Negative for cough, sputum production and shortness of breath.   Cardiovascular: Negative for chest  pain.  Gastrointestinal: Negative for abdominal pain, nausea and vomiting.  Genitourinary: Negative for dysuria, flank pain, frequency and urgency.  Musculoskeletal: Positive for joint pain (Left hip). Negative for back pain, falls, myalgias and neck pain.  Neurological: Negative for dizziness, tingling, sensory change, focal weakness, loss of consciousness and headaches.  Endo/Heme/Allergies: Does not bruise/bleed easily.  Psychiatric/Behavioral: Negative for depression, memory loss and substance abuse. The patient is not nervous/anxious.    Blood pressure 117/68, pulse 89, temperature 98.4 F (36.9 C), temperature source Oral, resp. rate 18, height 6' 3"  (1.905 m), weight (!) 170.2 kg, SpO2 97 %. Physical Exam  Constitutional: He appears well-developed and well-nourished. No distress.  HENT:  Head: Normocephalic and atraumatic.  Eyes: Conjunctivae are normal. Right eye exhibits no discharge. Left eye exhibits no discharge. No scleral icterus.  Neck: Normal range of motion.  Cardiovascular: Normal rate and regular rhythm.  Respiratory: Effort normal. No respiratory distress.  Musculoskeletal:     Comments: LLE No traumatic wounds, ecchymosis, or rash  Mod TTP lateral hip/glute, indurated, some mild erythema, pain with hip flex >30 degrees  No knee or ankle effusion  Knee stable to varus/ valgus and anterior/posterior stress  Sens DPN, SPN, TN intact  Motor EHL, ext, flex, evers 5/5  DP 1+, PT 0, No significant edema  Neurological: He is alert.  Skin: Skin is warm and dry. He is not diaphoretic.  Psychiatric: He has a normal mood and affect. His behavior is normal.    Assessment/Plan: Left gluteal abscess -- Will have IR attempt aspiration, likely drain placement. If this and abx fail to improve pt will need operative I&D. DM OSA Morbid obesity    Lisette Abu, PA-C Orthopedic Surgery [phone] 06/16/2019, 2:43 PM

## 2019-06-16 NOTE — Progress Notes (Signed)
[facility] for Infectious Disease   Reason for visit: Follow up on MSSA BSI/LT hip abscess  Antibiotics: Cefazolin, day 6 + 1 day of vanc/ctx  Interval History: TEE today showed no evidence of valvular vegetation or endocarditis. MRI on Friday PM showed large 22 cm loculated abscess within the LT gluteal region extending into the trochanteric bursa. Uncertain if orthopedics has evaluated patient yet. Spoke with the patient's wife on the phone during today's visit to keep her updated. He continues to c/o LT hip pain and has minimal if any ambulatory efforts today and for much of his admission. Fever curve, WBC & Cr trends, imaging, cx results, and ABX usage all independently reviewed    Current Facility-Administered Medications:    0.9 %  sodium chloride infusion, , Intravenous, Continuous, Sarajane Jews, Callie E, PA-C, Last Rate: 20 mL/hr at 06/16/19 0222   [MAR Hold] acetaminophen (TYLENOL) tablet 650 mg, 650 mg, Oral, Q6H PRN, Blount, Xenia T, NP, 650 mg at 06/13/19 1719   [MAR Hold] bisacodyl (DULCOLAX) suppository 10 mg, 10 mg, Rectal, Once, Donne Hazel, MD   [facility] Hold] ceFAZolin (ANCEF) IVPB 2g/100 mL premix, 2 g, Intravenous, Q8H, Donne Hazel, MD, Last Rate: 200 mL/hr at 06/16/19 0412, 2 g at 06/16/19 0412   [MAR Hold] cholecalciferol (VITAMIN D3) tablet 2,000 Units, 2,000 Units, Oral, Daily, Domenic Polite, MD, 2,000 Units at 06/16/19 0834   [facility] Hold] heparin injection 5,000 Units, 5,000 Units, Subcutaneous, Q8H, Domenic Polite, MD, 5,000 Units at 06/16/19 0409   [facility] Hold] HYDROmorphone (DILAUDID) injection 0.5 mg, 0.5 mg, Intravenous, Q4H PRN, Donne Hazel, MD, 0.5 mg at 06/15/19 1237   [MAR Hold] insulin aspart (novoLOG) injection 0-15 Units, 0-15 Units, Subcutaneous, TID WC, Domenic Polite, MD, 3 Units at 06/16/19 0836   [facility] Hold] insulin aspart (novoLOG) injection 0-5 Units, 0-5 Units, Subcutaneous, QHS, Domenic Polite, MD, 2 Units at 06/15/19 2129    [facility] Hold] insulin aspart (novoLOG) injection 8 Units, 8 Units, Subcutaneous, TID WC, Domenic Polite, MD, 8 Units at 06/15/19 0831   [MAR Hold] insulin glargine (LANTUS) injection 25 Units, 25 Units, Subcutaneous, Daily, Domenic Polite, MD, 25 Units at 06/15/19 2129   [facility] Hold] metoprolol tartrate (LOPRESSOR) injection 5 mg, 5 mg, Intravenous, Q5 min PRN, Blount, Xenia T, NP, 5 mg at 06/15/19 1811   [MAR Hold] multivitamin with minerals tablet 1 tablet, 1 tablet, Oral, Daily, Donne Hazel, MD, 1 tablet at 06/16/19 0835   [MAR Hold] oxyCODONE-acetaminophen (PERCOCET/ROXICET) 5-325 MG per tablet 1 tablet, 1 tablet, Oral, Q4H PRN, Donne Hazel, MD, 1 tablet at 06/16/19 0835   [MAR Hold] protein supplement (ENSURE MAX) liquid, 11 oz, Oral, Daily, Donne Hazel, MD, 11 oz at 06/15/19 1421   [MAR Hold] sorbitol 70 % solution 30 mL, 30 mL, Oral, NOW, Donne Hazel, MD   [facility] Hold] vitamin C (ASCORBIC ACID) tablet 1,000 mg, 1,000 mg, Oral, Daily, Domenic Polite, MD, 1,000 mg at 06/16/19 0835   Physical Exam:   Vitals:   06/16/19 1252 06/16/19 1304  BP: (!) 99/33 (!) 103/34  Pulse: 90 88  Resp: 15 12  Temp: 97.7 F (36.5 C)   SpO2: 99% 99%   Physical Exam Gen: morbidly obese, chronically ill, mild distress secondary to LT hip pain, A&Ox 3 Head: NCAT, no temporal wasting evident EENT: PERRL, EOMI, MMM, adequate dentition Neck: supple, no JVD CV: NRRR, no murmurs evident Pulm: CTA bilaterally, no wheeze or retractions Abd: soft, obese with  large pannus, NTND, +BS MSK: +TTP along lateral LT hip overlying trochanteric bursa, decreased ROM to LT hip Extrems: 1+ pitting LE edema, 2+ pulses Skin: improving LT hip erythema, new petechial rash to his RT upper arm (linear appearance possibly from BP cuff) and his RT ankle, adequate skin turgor Neuro: CN II-XII grossly intact, no focal neurologic deficits appreciated, gait was unable to be assessed, A&Ox 3   Review of  Systems:  Review of Systems  Constitutional: Positive for malaise/fatigue. Negative for chills, fever and weight loss.  HENT: Negative for congestion, hearing loss, sinus pain and sore throat.   Eyes: Negative for blurred vision, photophobia and discharge.  Respiratory: Negative for cough, hemoptysis and shortness of breath.   Cardiovascular: Negative for chest pain, palpitations, orthopnea and leg swelling.  Gastrointestinal: Negative for abdominal pain, constipation, diarrhea, heartburn, nausea and vomiting.  Genitourinary: Negative for dysuria, flank pain, frequency and urgency.  Musculoskeletal: Positive for joint pain and myalgias. Negative for back pain.       LT hip/gluteal pain  Skin: Positive for rash. Negative for itching.  Neurological: Positive for weakness. Negative for tremors, seizures and headaches.  Endo/Heme/Allergies: Negative for polydipsia. Does not bruise/bleed easily.  Psychiatric/Behavioral: Negative for depression and substance abuse. The patient is nervous/anxious. The patient does not have insomnia.      Lab Results  Component Value Date   WBC 14.2 (H) 06/16/2019   HGB 11.2 (L) 06/16/2019   HCT 32.8 (L) 06/16/2019   MCV 89.1 06/16/2019   PLT 123 (L) 06/16/2019    Lab Results  Component Value Date   CREATININE 0.72 06/16/2019   BUN 18 06/16/2019   NA 131 (L) 06/16/2019   K 4.0 06/16/2019   CL 100 06/16/2019   CO2 23 06/16/2019    Lab Results  Component Value Date   ALT 28 06/16/2019   AST 81 (H) 06/16/2019   ALKPHOS 299 (H) 06/16/2019     Microbiology: Recent Results (from the past 240 hour(s))  Blood culture (routine x 2)     Status: Abnormal   Collection Time: 06/10/19  1:25 PM   Specimen: BLOOD  Result Value Ref Range Status   Specimen Description BLOOD LEFT ANTECUBITAL  Final   Special Requests   Final    BOTTLES DRAWN AEROBIC AND ANAEROBIC Blood Culture adequate volume   Culture  Setup Time   Final    GRAM POSITIVE COCCI IN  CLUSTERS IN BOTH AEROBIC AND ANAEROBIC BOTTLES CRITICAL RESULT CALLED TO, READ BACK BY AND VERIFIED WITH: K. AMEND PHARMD, AT [place] 06/11/19 BY D. VANHOOK    Culture (A)  Final    STAPHYLOCOCCUS AUREUS SUSCEPTIBILITIES PERFORMED ON PREVIOUS CULTURE WITHIN THE LAST 5 DAYS. Performed at [facility], [place] [address], [place], [place] [address]    Report Status 06/13/2019 FINAL  Final  Blood culture (routine x 2)     Status: Abnormal   Collection Time: 06/10/19  1:35 PM   Specimen: BLOOD  Result Value Ref Range Status   Specimen Description BLOOD BLOOD RIGHT FOREARM  Final   Special Requests   Final    BOTTLES DRAWN AEROBIC AND ANAEROBIC Blood Culture adequate volume   Culture  Setup Time   Final    GRAM POSITIVE COCCI AEROBIC BOTTLE ONLY CRITICAL VALUE NOTED.  VALUE IS CONSISTENT WITH PREVIOUSLY REPORTED AND CALLED VALUE. Performed at [facility], [place] [address] [place], [place] [address]    Culture STAPHYLOCOCCUS AUREUS (A)  Final  Report Status 06/13/2019 FINAL  Final   Organism ID, Bacteria STAPHYLOCOCCUS AUREUS  Final      Susceptibility   Staphylococcus aureus - MIC*    CIPROFLOXACIN <=0.5 SENSITIVE Sensitive     ERYTHROMYCIN <=0.25 SENSITIVE Sensitive     GENTAMICIN <=0.5 SENSITIVE Sensitive     OXACILLIN 0.5 SENSITIVE Sensitive     TETRACYCLINE >=16 RESISTANT Resistant     VANCOMYCIN 1 SENSITIVE Sensitive     TRIMETH/SULFA <=10 SENSITIVE Sensitive     CLINDAMYCIN <=0.25 SENSITIVE Sensitive     RIFAMPIN <=0.5 SENSITIVE Sensitive     Inducible Clindamycin NEGATIVE Sensitive     * STAPHYLOCOCCUS AUREUS  Blood Culture ID Panel (Reflexed)     Status: Abnormal   Collection Time: 06/10/19  1:35 PM  Result Value Ref Range Status   Enterococcus species NOT DETECTED NOT DETECTED Final   Listeria monocytogenes NOT DETECTED NOT DETECTED Final   Staphylococcus species DETECTED (A) NOT DETECTED Final    Comment: CRITICAL RESULT CALLED TO, READ BACK BY AND VERIFIED  WITH: Andres Shad PharmD 14:55 06/11/19 ([email or web address])    Staphylococcus aureus (BCID) DETECTED (A) NOT DETECTED Final    Comment: Methicillin (oxacillin) susceptible Staphylococcus aureus (MSSA). Preferred therapy is anti staphylococcal beta lactam antibiotic (Cefazolin or Nafcillin), unless clinically contraindicated. CRITICAL RESULT CALLED TO, READ BACK BY AND VERIFIED WITH: Andres Shad PharmD 14:55 06/11/19 ([email or web address])    Methicillin resistance NOT DETECTED NOT DETECTED Final   Streptococcus species NOT DETECTED NOT DETECTED Final   Streptococcus agalactiae NOT DETECTED NOT DETECTED Final   Streptococcus pneumoniae NOT DETECTED NOT DETECTED Final   Streptococcus pyogenes NOT DETECTED NOT DETECTED Final   Acinetobacter baumannii NOT DETECTED NOT DETECTED Final   Enterobacteriaceae species NOT DETECTED NOT DETECTED Final   Enterobacter cloacae complex NOT DETECTED NOT DETECTED Final   Escherichia coli NOT DETECTED NOT DETECTED Final   Klebsiella oxytoca NOT DETECTED NOT DETECTED Final   Klebsiella pneumoniae NOT DETECTED NOT DETECTED Final   Proteus species NOT DETECTED NOT DETECTED Final   Serratia marcescens NOT DETECTED NOT DETECTED Final   Haemophilus influenzae NOT DETECTED NOT DETECTED Final   Neisseria meningitidis NOT DETECTED NOT DETECTED Final   Pseudomonas aeruginosa NOT DETECTED NOT DETECTED Final   Candida albicans NOT DETECTED NOT DETECTED Final   Candida glabrata NOT DETECTED NOT DETECTED Final   Candida krusei NOT DETECTED NOT DETECTED Final   Candida parapsilosis NOT DETECTED NOT DETECTED Final   Candida tropicalis NOT DETECTED NOT DETECTED Final    Comment: Performed at [facility], [address], [place], [place] [address]  Urine culture     Status: Abnormal   Collection Time: 06/10/19  1:45 PM   Specimen: Urine, Clean Catch  Result Value Ref Range Status   Specimen Description URINE, CLEAN CATCH  Final   Special Requests   Final    URINE, RANDOM Performed at [facility], [place] [address], [place], [place] [address]    Culture >=100,000 COLONIES/mL STAPHYLOCOCCUS AUREUS (A)  Final   Report Status 06/12/2019 FINAL  Final   Organism ID, Bacteria STAPHYLOCOCCUS AUREUS (A)  Final      Susceptibility   Staphylococcus aureus - MIC*    CIPROFLOXACIN <=0.5 SENSITIVE Sensitive     GENTAMICIN <=0.5 SENSITIVE Sensitive     NITROFURANTOIN 32 SENSITIVE Sensitive     OXACILLIN 0.5 SENSITIVE Sensitive     TETRACYCLINE >=16 RESISTANT Resistant     VANCOMYCIN 1 SENSITIVE Sensitive  TRIMETH/SULFA <=10 SENSITIVE Sensitive     CLINDAMYCIN <=0.25 SENSITIVE Sensitive     RIFAMPIN <=0.5 SENSITIVE Sensitive     Inducible Clindamycin NEGATIVE Sensitive     * >=100,000 COLONIES/mL STAPHYLOCOCCUS AUREUS  SARS CORONAVIRUS 2 (TAT 6-24 HRS) Nasopharyngeal Nasopharyngeal Swab     Status: None   Collection Time: 06/10/19  6:40 PM   Specimen: Nasopharyngeal Swab  Result Value Ref Range Status   SARS Coronavirus 2 NEGATIVE NEGATIVE Final    Comment: (NOTE) SARS-CoV-2 target nucleic acids are NOT DETECTED. The SARS-CoV-2 RNA is generally detectable in upper and lower respiratory specimens during the acute phase of infection. Negative results do not preclude SARS-CoV-2 infection, do not rule out co-infections with other pathogens, and should not be used as the sole basis for treatment or other patient management decisions. Negative results must be combined with clinical observations, patient history, and epidemiological information. The expected result is Negative. Fact Sheet for Patients: SugarRoll.be Fact Sheet for Healthcare Providers: https://www.woods-mathews.com/ This test is not yet approved or cleared by the [place] FDA and  has been authorized for detection and/or diagnosis of SARS-CoV-2 by FDA under an Emergency Use Authorization (EUA). This EUA will remain  in effect (meaning this test can be used) for the  duration of the COVID-19 declaration under Section 56 4(b)(1) of the Act, 21 U.S.C. section 360bbb-3(b)(1), unless the authorization is terminated or revoked sooner. Performed at [facility], [place] [address], [place], [place] [address]   Culture, blood (routine x 2)     Status: None (Preliminary result)   Collection Time: 06/11/19  4:35 PM   Specimen: BLOOD LEFT HAND  Result Value Ref Range Status   Specimen Description BLOOD LEFT HAND  Final   Special Requests   Final    BOTTLES DRAWN AEROBIC AND ANAEROBIC Blood Culture adequate volume   Culture   Final    NO GROWTH 4 DAYS Performed at [facility], [place] [address] [place], [place] [address]    Report Status PENDING  Incomplete  Culture, blood (routine x 2)     Status: None (Preliminary result)   Collection Time: 06/11/19  4:40 PM   Specimen: BLOOD RIGHT HAND  Result Value Ref Range Status   Specimen Description BLOOD RIGHT HAND  Final   Special Requests   Final    BOTTLES DRAWN AEROBIC ONLY Blood Culture adequate volume   Culture   Final    NO GROWTH 4 DAYS Performed at [facility], [place] [address], [place], [place] [address]    Report Status PENDING  Incomplete    Impression/Plan: The patient is a 53 year old morbidly obese white male with poorly controlled diabetes and recent multiple falls resulting in a recent left gluteal hematoma now presenting with MSSA bacteremia, leukocytosis, and probable left hip septic joint infection/abscess.  1.  MSSA bacteremia -the patient has developed clearance of his bacteremia as evidenced by repeat blood cultures after both of his initial blood cultures showed MSSA.  Continue cefazolin 2 g IV every 8 hours for now.  Today's TEE showed no evidence of endocarditis.  Duration of treatment will likely be 4 weeks from time of debridement to his left hip.  His bacteremia most likely originated from an infected hematoma to his left gluteal/hip region from blunt trauma and recent  falls.  2.  Probable left hip septic arthritis -the patient's MRI on Friday confirmed a large 22 cm loculated hematoma/abscess.  In the setting of an  active MSSA bacteremia, persistent leukocytosis despite appropriate antibiotic treatment, and severe left hip pain, I would not view this as a simple resorbing hematoma but instead a left hip muscular abscess.  I confirmed with the patient's primary care physician that the orthopedic service has been consulted as I suspect he will likely need a arthrotomy versus arthroscopy to his left hip and for evacuation of his infected hematoma.  Patient states that he has not eaten food thus far today, so perhaps he can even go to the operating room today to avoid further delays.  The patient has a new rash that is apparent to his right arm and right ankle that is becoming worrisome for a reaction to his antibiotics.  This would add another reason for more expedited surgical debridement to is now recognized left hip abscess.  3.  Leukocytosis -patient's white blood cell count has remained elevated throughout his entire admission but has gradually improved over the last several days with further ongoing antibiotics for his MSSA bacteremia.  We will continue to check the patient's CBC with differential daily to follow his white blood cell count trend.  I suspect the patient's white blood cell count will remain elevated until he has surgical intervention to his left hip abscess/septic joint.  4.  Poorly controlled diabetes -patient has had significant improvement in his glycemic control during this hospitalization.  I appreciate the hospitalist effort to maintain the patient's blood sugar is close to 150 or lower to optimize his infectious outcome.  The patient is still in need of diabetic education regarding diet, the need for daily blood sugar checks, and affordable medication regimen to allow for control of his chronic condition.  1.

## 2019-06-16 NOTE — Anesthesia Preprocedure Evaluation (Signed)
Anesthesia Evaluation  Patient identified by MRN, date of birth, ID band Patient awake    Reviewed: Allergy & Precautions, NPO status , Patient's Chart, lab work & pertinent test results  Airway Mallampati: II  TM Distance: >3 FB Neck ROM: Full    Dental  (+) Teeth Intact, Dental Advisory Given, Chipped,    Pulmonary sleep apnea ,    Pulmonary exam normal breath sounds clear to auscultation       Cardiovascular hypertension, Pt. on medications  Rhythm:Regular Rate:Tachycardia     Neuro/Psych  Headaches, negative psych ROS   GI/Hepatic negative GI ROS, Neg liver ROS,   Endo/Other  diabetes, Type 2, Insulin DependentMorbid obesity  Renal/GU negative Renal ROS     Musculoskeletal  (+) Arthritis ,   Abdominal   Peds  Hematology  (+) Blood dyscrasia, anemia , bacteremia   Anesthesia Other Findings Day of surgery medications reviewed with the patient.  Reproductive/Obstetrics                             Anesthesia Physical Anesthesia Plan  ASA: III  Anesthesia Plan: MAC   Post-op Pain Management:    Induction: Intravenous  PONV Risk Score and Plan: 1 and Propofol infusion and Treatment may vary due to age or medical condition  Airway Management Planned: Nasal Cannula and Natural Airway  Additional Equipment:   Intra-op Plan:   Post-operative Plan:   Informed Consent: I have reviewed the patients History and Physical, chart, labs and discussed the procedure including the risks, benefits and alternatives for the proposed anesthesia with the patient or authorized representative who has indicated his/her understanding and acceptance.     Dental advisory given  Plan Discussed with: CRNA and Anesthesiologist  Anesthesia Plan Comments:         Anesthesia Quick Evaluation

## 2019-06-16 NOTE — CV Procedure (Signed)
     Transesophageal Echocardiogram Note  Robert Sullivan [id number] 1965-09-24  Procedure: Transesophageal Echocardiogram Indications: MSSA Bacteremia  Procedure Details Consent: Obtained Time Out: Verified patient identification, verified procedure, site/side was marked, verified correct patient position, special equipment/implants available, Radiology Safety Procedures followed,  medications/allergies/relevent history reviewed, required imaging and test results available.  Performed  Medications: IV propofol 210 mg administered by anesthesia staff for sedation  There is no evidence for a vegetation.  Complications: No apparent complications Patient did tolerate procedure well.  [name], MD, [facility] 06/16/2019, 12:53 PM

## 2019-06-16 NOTE — Progress Notes (Signed)
PROGRESS NOTE    Robert Sullivan  NOB:[id number] DOB: 05/14/1966 DOA: 06/10/2019 PCP: Cathleen Corti, PA-C    Brief Narrative:  53 y.o. male with history of uncontrolled type 2 diabetes mellitus, morbid obesity was admitted to [facility] from 11/2-11/7 with E. coli UTI, uncontrolled diabetes and a fall that resulted in a left gluteal hematoma, he was treated with IV antibiotics, discharged home on Bactrim, completed course and also on insulin. -He felt well initially after discharge, in the last 7 to 10 days he started having fevers and chills, profuse sweats, 5 to 6 days ago he started having increased pain and swelling at the site of his gluteal hematoma, he also noticed that his urine was darker but denied any dysuria or change in odor presented to the emergency room today with weakness, worsening left hip pain, fevers chills and sweats. -In the ED he was noted to have a sodium of 131, CBG of 380, albumin of 1.8, ALP of 224,  lactic acid of 5.5, white count of 20,000.  In the ED he was noted to have an elevated D-dimer subsequently EDP proceeded with CT angiogram which was negative for pulmonary embolism, his UA was significantly abnormal with cloudy urine, positive nitrite, leukocyte esterase, numerous WBC and bacteria.  In addition noted to have redness and tenderness of his left gluteal region  Assessment & Plan:   Active Problems:   Acute lower UTI   Sepsis ([place])   Uncontrolled diabetes mellitus (HCC)   Obesity, Class III, BMI 40-49.9 (morbid obesity) (HCC)   Cellulitis, gluteal, left  Sepsis secondary to MSSA bacteremia -Blood cx pos for MSSA, ID now consulted with recommendation for 2d echo to r/o endocarditis, neg findings -Will benefit from TEE, tentatively planned for 12/7 -currently on ancef per ID -CT reviewed, no drainable abscess identified in L hip. 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 Pt better able to ambulate -Appreciate Orthopedics input. Recommendation for trial of IR placed drain and if not successful, then will likely need surgical debridement  Morbid obesity -BMI of 47 -diet/lifestyle modification recommended  Uncontrolled type 2 diabetes mellitus -Hemoglobin A1c was 11 recently -Resume Lantus and NovoLog premeal and sliding scale insulin -Currently stable  Suspected cirrhosis -Noted on imaging, patient denies any alcohol use currently but did drink regularly in the past, suspect NASH/fatty liver disease is likely the primary culprit -Hep C neg -GI follow up recommended on d/c  DVT prophylaxis: Heparin subQ Code Status: Full Family Communication: Pt in room, family not at bedside Disposition Plan: Uncertain at this time  Consultants:   ID  Cardiology  Orthopedic Surgery  Procedures:     Antimicrobials: Anti-infectives (From admission, onward)   Start     Dose/Rate Route Frequency Ordered Stop   06/11/19 2200  ceFAZolin (ANCEF) IVPB 2g/100 mL premix     2 g 200 mL/hr over 30 Minutes Intravenous Every 8 hours 06/11/19 1456     06/11/19 0200  vancomycin (VANCOCIN) 1,750 mg in  sodium chloride 0.9 % 500 mL IVPB  Status:  Discontinued     1,750 mg 250 mL/hr over 120 Minutes Intravenous Every 12 hours 06/10/19 1257 06/11/19 1456   06/10/19 2200  ceFEPIme (MAXIPIME) 2 g in sodium chloride 0.9 % 100 mL IVPB  Status:  Discontinued     2 g 200 mL/hr over  30 Minutes Intravenous Every 8 hours 06/10/19 1257 06/10/19 1740   06/10/19 1745  cefTRIAXone (ROCEPHIN) 1 g in sodium chloride 0.9 % 100 mL IVPB  Status:  Discontinued     1 g 200 mL/hr over 30 Minutes Intravenous Every 24 hours 06/10/19 1740 06/11/19 1456   06/10/19 1245  ceFEPIme (MAXIPIME) 2 g in sodium chloride 0.9 % 100 mL IVPB     2 g 200 mL/hr over 30 Minutes Intravenous  Once 06/10/19 1239 06/10/19 1411   06/10/19 1245  vancomycin (VANCOCIN) IVPB 1000 mg/200 mL premix  Status:  Discontinued     1,000 mg 200 mL/hr over 60 Minutes Intravenous  Once 06/10/19 1239 06/10/19 1241   06/10/19 1245  vancomycin (VANCOCIN) 2,500 mg in sodium chloride 0.9 % 500 mL IVPB     2,500 mg 250 mL/hr over 120 Minutes Intravenous  Once 06/10/19 1241 06/10/19 1635      Subjective: Still complaining of L hip pain  Objective: Vitals:   06/16/19 1304 06/16/19 1320 06/16/19 1334 06/16/19 1353  BP: (!) 103/34 (!) 94/44 (!) 113/54 117/68  Pulse: 88 89 91 89  Resp: 12 17 15 18   Temp:    98.4 F (36.9 C)  TempSrc:    Oral  SpO2: 99% 98% 99% 97%  Weight:      Height:        Intake/Output Summary (Last 24 hours) at 06/16/2019 1626 Last data filed at 06/16/2019 1500 Gross per 24 hour  Intake 1032.67 ml  Output 0 ml  Net 1032.67 ml   Filed Weights   06/15/19 0100 06/16/19 0430 06/16/19 1117  Weight: (!) 173 kg (!) 170.2 kg (!) 170.2 kg    Examination: General exam: Conversant, in no acute distress Respiratory system: normal chest rise, clear, no audible wheezing Cardiovascular system: regular rhythm, s1-s2 Gastrointestinal system: Nondistended, nontender, pos BS Central nervous system: No seizures, no tremors Extremities: No cyanosis, no joint deformities Skin: No rashes, no pallor Psychiatry: Affect normal // no auditory hallucinations   Data Reviewed: I have personally reviewed following labs and imaging studies  CBC: Recent Labs  Lab 06/10/19 1129  06/12/19 0318 06/13/19 0236  06/14/19 0442 06/15/19 0545 06/16/19 0512  WBC 20.4*   < > 16.6* 18.3* 19.8* 14.8* 14.2*  NEUTROABS 17.6*  --   --   --   --   --   --   HGB 13.6   < > 12.1* 12.2* 12.0* 11.4* 11.2*  HCT 41.0   < > 35.3* 35.4* 35.0* 33.3* 32.8*  MCV 92.3   < > 89.4 89.6 89.3 89.3 89.1  PLT 198   < > 150 148* 148* 123* 123*   < > = values in this interval not displayed.   Basic Metabolic Panel: Recent Labs  Lab 06/12/19 0318 06/13/19 0236 06/14/19 0442 06/15/19 0545 06/16/19 0512  NA 130* 131* 132* 129* 131*  K 4.6 5.0 4.6 4.4 4.0  CL 101 101 101 100 100  CO2 21* 20* 22 22 23   GLUCOSE 201* 173* 186* 195* 209*  BUN 24* 23* 20 17 18   CREATININE 0.90 0.78 0.76 0.62 0.72  CALCIUM 8.0* 8.2* 7.9* 7.8* 7.9*   GFR: Estimated Creatinine Clearance: 181.5 mL/min (by C-G formula based on SCr of 0.72 mg/dL). Liver Function Tests: Recent Labs  Lab 06/12/19 0318 06/13/19 0236 06/14/19 0442 06/15/19 0545 06/16/19 0512  AST 83* 82* 88* 82* 81*  ALT 42 37 32 29 28  ALKPHOS 238* 272* 282* 292* 299*  BILITOT 1.2 1.2 1.5* 1.4* 1.2  PROT 6.2* 6.0* 6.2* 5.7* 5.9*  ALBUMIN 1.4* 1.3* 1.3* 1.2* 1.2*   No results for input(s): LIPASE, AMYLASE in the last 168 hours. No results for input(s): AMMONIA in the last 168 hours. Coagulation Profile: Recent Labs  Lab 06/10/19 1345  INR 1.4*   Cardiac Enzymes: Recent Labs  Lab 06/10/19 1129 06/12/19 0318  CKTOTAL 23* 15*   BNP (last 3 results) No results for input(s): PROBNP in the last 8760 hours. HbA1C: No results for input(s): HGBA1C in the last 72 hours. CBG: Recent Labs  Lab 06/15/19 1107 06/15/19 1626 06/15/19 2046 06/16/19 0700 06/16/19 1351  GLUCAP 139* 188* 228* 182* 190*   Lipid Profile: No results for input(s): CHOL, HDL, LDLCALC, TRIG, CHOLHDL, LDLDIRECT in the last 72 hours. Thyroid Function Tests: No results for input(s): TSH, T4TOTAL, FREET4, T3FREE, THYROIDAB in the last 72 hours. Anemia Panel: No results for input(s):  VITAMINB12, FOLATE, FERRITIN, TIBC, IRON, RETICCTPCT in the last 72 hours. Sepsis Labs: Recent Labs  Lab 06/10/19 1129 06/10/19 1345  LATICACIDVEN 5.5* 2.6*    Recent Results (from the past 240 hour(s))  Blood culture (routine x 2)     Status: Abnormal   Collection Time: 06/10/19  1:25 PM   Specimen: BLOOD  Result Value Ref Range Status   Specimen Description BLOOD LEFT ANTECUBITAL  Final   Special Requests   Final    BOTTLES DRAWN AEROBIC AND ANAEROBIC Blood Culture adequate volume   Culture  Setup Time   Final    GRAM POSITIVE COCCI IN CLUSTERS IN BOTH AEROBIC AND ANAEROBIC BOTTLES CRITICAL RESULT CALLED TO, READ BACK BY AND VERIFIED WITH: K. AMEND PHARMD, AT 3662 06/11/19 BY D. VANHOOK    Culture (A)  Final    STAPHYLOCOCCUS AUREUS SUSCEPTIBILITIES PERFORMED ON PREVIOUS CULTURE WITHIN THE LAST 5 DAYS. Performed at [facility], [place] [address] [place], [place] [address]    Report Status 06/13/2019 FINAL  Final  Blood culture (routine x 2)     Status: Abnormal   Collection Time: 06/10/19  1:35 PM   Specimen: BLOOD  Result Value Ref Range Status   Specimen Description BLOOD BLOOD RIGHT FOREARM  Final   Special Requests   Final    BOTTLES DRAWN AEROBIC AND ANAEROBIC Blood Culture adequate volume   Culture  Setup Time   Final    GRAM POSITIVE COCCI AEROBIC BOTTLE ONLY CRITICAL VALUE NOTED.  VALUE IS CONSISTENT WITH PREVIOUSLY REPORTED AND CALLED VALUE. Performed at [facility], [place] [address], [place], [place] [address]    Culture STAPHYLOCOCCUS AUREUS (A)  Final   Report Status 06/13/2019 FINAL  Final   Organism ID, Bacteria STAPHYLOCOCCUS AUREUS  Final      Susceptibility   Staphylococcus aureus - MIC*    CIPROFLOXACIN <=0.5 SENSITIVE Sensitive     ERYTHROMYCIN <=0.25 SENSITIVE Sensitive     GENTAMICIN <=0.5 SENSITIVE Sensitive     OXACILLIN 0.5 SENSITIVE Sensitive     TETRACYCLINE >=16 RESISTANT Resistant     VANCOMYCIN 1 SENSITIVE Sensitive      TRIMETH/SULFA <=10 SENSITIVE Sensitive  CLINDAMYCIN <=0.25 SENSITIVE Sensitive     RIFAMPIN <=0.5 SENSITIVE Sensitive     Inducible Clindamycin NEGATIVE Sensitive     * STAPHYLOCOCCUS AUREUS  Blood Culture ID Panel (Reflexed)     Status: Abnormal   Collection Time: 06/10/19  1:35 PM  Result Value Ref Range Status   Enterococcus species NOT DETECTED NOT DETECTED Final   Listeria monocytogenes NOT DETECTED NOT DETECTED Final   Staphylococcus species DETECTED (A) NOT DETECTED Final    Comment: CRITICAL RESULT CALLED TO, READ BACK BY AND VERIFIED WITH: Andres Shad PharmD 14:55 06/11/19 ([email or web address])    Staphylococcus aureus (BCID) DETECTED (A) NOT DETECTED Final    Comment: Methicillin (oxacillin) susceptible Staphylococcus aureus (MSSA). Preferred therapy is anti staphylococcal beta lactam antibiotic (Cefazolin or Nafcillin), unless clinically contraindicated. CRITICAL RESULT CALLED TO, READ BACK BY AND VERIFIED WITH: Andres Shad PharmD 14:55 06/11/19 ([email or web address])    Methicillin resistance NOT DETECTED NOT DETECTED Final   Streptococcus species NOT DETECTED NOT DETECTED Final   Streptococcus agalactiae NOT DETECTED NOT DETECTED Final   Streptococcus pneumoniae NOT DETECTED NOT DETECTED Final   Streptococcus pyogenes NOT DETECTED NOT DETECTED Final   Acinetobacter baumannii NOT DETECTED NOT DETECTED Final   Enterobacteriaceae species NOT DETECTED NOT DETECTED Final   Enterobacter cloacae complex NOT DETECTED NOT DETECTED Final   Escherichia coli NOT DETECTED NOT DETECTED Final   Klebsiella oxytoca NOT DETECTED NOT DETECTED Final   Klebsiella pneumoniae NOT DETECTED NOT DETECTED Final   Proteus species NOT DETECTED NOT DETECTED Final   Serratia marcescens NOT DETECTED NOT DETECTED Final   Haemophilus influenzae NOT DETECTED NOT DETECTED Final   Neisseria meningitidis NOT DETECTED NOT DETECTED Final   Pseudomonas aeruginosa NOT DETECTED NOT DETECTED Final   Candida albicans NOT DETECTED NOT DETECTED  Final   Candida glabrata NOT DETECTED NOT DETECTED Final   Candida krusei NOT DETECTED NOT DETECTED Final   Candida parapsilosis NOT DETECTED NOT DETECTED Final   Candida tropicalis NOT DETECTED NOT DETECTED Final    Comment: Performed at [facility], [address] [place], [place] [address]  Urine culture     Status: Abnormal   Collection Time: 06/10/19  1:45 PM   Specimen: Urine, Clean Catch  Result Value Ref Range Status   Specimen Description URINE, CLEAN CATCH  Final   Special Requests   Final    URINE, RANDOM Performed at [facility], [place] [address], [place], [place] [address]    Culture >=100,000 COLONIES/mL STAPHYLOCOCCUS AUREUS (A)  Final   Report Status 06/12/2019 FINAL  Final   Organism ID, Bacteria STAPHYLOCOCCUS AUREUS (A)  Final      Susceptibility   Staphylococcus aureus - MIC*    CIPROFLOXACIN <=0.5 SENSITIVE Sensitive     GENTAMICIN <=0.5 SENSITIVE Sensitive     NITROFURANTOIN 32 SENSITIVE Sensitive     OXACILLIN 0.5 SENSITIVE Sensitive     TETRACYCLINE >=16 RESISTANT Resistant     VANCOMYCIN 1 SENSITIVE Sensitive     TRIMETH/SULFA <=10 SENSITIVE Sensitive     CLINDAMYCIN <=0.25 SENSITIVE Sensitive     RIFAMPIN <=0.5 SENSITIVE Sensitive     Inducible Clindamycin NEGATIVE Sensitive     * >=100,000 COLONIES/mL STAPHYLOCOCCUS AUREUS  SARS CORONAVIRUS 2 (TAT 6-24 HRS) Nasopharyngeal Nasopharyngeal Swab     Status: None   Collection Time: 06/10/19  6:40 PM   Specimen: Nasopharyngeal Swab  Result Value Ref Range Status   SARS Coronavirus 2 NEGATIVE NEGATIVE Final    Comment: (NOTE)  SARS-CoV-2 target nucleic acids are NOT DETECTED. The SARS-CoV-2 RNA is generally detectable in upper and lower respiratory specimens during the acute phase of infection. Negative results do not preclude SARS-CoV-2 infection, do not rule out co-infections with other pathogens, and should not be used as the sole basis for treatment or other patient management decisions.  Negative results must be combined with clinical observations, patient history, and epidemiological information. The expected result is Negative. Fact Sheet for Patients: SugarRoll.be Fact Sheet for Healthcare Providers: https://www.woods-mathews.com/ This test is not yet approved or cleared by the [place] FDA and  has been authorized for detection and/or diagnosis of SARS-CoV-2 by FDA under an Emergency Use Authorization (EUA). This EUA will remain  in effect (meaning this test can be used) for the duration of the COVID-19 declaration under Section 56 4(b)(1) of the Act, 21 U.S.C. section 360bbb-3(b)(1), unless the authorization is terminated or revoked sooner. Performed at [facility], [place] [address], [place], [place] [address]   Culture, blood (routine x 2)     Status: None   Collection Time: 06/11/19  4:35 PM   Specimen: BLOOD LEFT HAND  Result Value Ref Range Status   Specimen Description BLOOD LEFT HAND  Final   Special Requests   Final    BOTTLES DRAWN AEROBIC AND ANAEROBIC Blood Culture adequate volume   Culture   Final    NO GROWTH 5 DAYS Performed at [facility], [place] [address] [place], [place] [address]    Report Status 06/16/2019 FINAL  Final  Culture, blood (routine x 2)     Status: None   Collection Time: 06/11/19  4:40 PM   Specimen: BLOOD RIGHT HAND  Result Value Ref Range Status   Specimen Description BLOOD RIGHT HAND  Final   Special Requests   Final    BOTTLES DRAWN AEROBIC ONLY Blood Culture adequate volume   Culture   Final    NO GROWTH 5 DAYS Performed at [facility], [place] [address] [place],  [address]    Report Status 06/16/2019 FINAL  Final     Radiology Studies: No results found.  Scheduled Meds: . bisacodyl  10 mg Rectal Once  . cholecalciferol  2,000 Units Oral Daily  . heparin  5,000 Units Subcutaneous Q8H  . insulin aspart  0-15 Units Subcutaneous TID WC  .  insulin aspart  0-5 Units Subcutaneous QHS  . insulin aspart  8 Units Subcutaneous TID WC  . insulin glargine  25 Units Subcutaneous Daily  . multivitamin with minerals  1 tablet Oral Daily  . Ensure Max Protein  11 oz Oral Daily  . vitamin C  1,000 mg Oral Daily   Continuous Infusions: .  ceFAZolin (ANCEF) IV 2 g (06/16/19 1448)     LOS: 6 days   Marylu Lund, MD [facility] Pager On Amion  If 7PM-7AM, please contact night-coverage 06/16/2019, 4:26 PM

## 2019-06-16 NOTE — Interval H&P Note (Signed)
History and Physical Interval Note:  06/16/2019 11:18 AM  Robert Sullivan  has presented today for surgery, with the diagnosis of bacteremia.  The various methods of treatment have been discussed with the patient and family. After consideration of risks, benefits and other options for treatment, the patient has consented to  Procedure(s): TRANSESOPHAGEAL ECHOCARDIOGRAM (TEE) (N/A) as a surgical intervention.  The patient's history has been reviewed, patient examined, no change in status, stable for surgery.  I have reviewed the patient's chart and labs.  Questions were answered to the patient's satisfaction.     [name]

## 2019-06-16 NOTE — Anesthesia Postprocedure Evaluation (Signed)
Anesthesia Post Note  Patient: Robert Sullivan  Procedure(s) Performed: TRANSESOPHAGEAL ECHOCARDIOGRAM (TEE) (N/A )     Patient location during evaluation: Endoscopy Anesthesia Type: MAC Level of consciousness: awake and alert Pain management: pain level controlled Vital Signs Assessment: post-procedure vital signs reviewed and stable Respiratory status: spontaneous breathing, nonlabored ventilation, respiratory function stable and patient connected to nasal cannula oxygen Cardiovascular status: stable and blood pressure returned to baseline Postop Assessment: no apparent nausea or vomiting Anesthetic complications: no    Last Vitals:  Vitals:   06/16/19 1353 06/16/19 1639  BP: 117/68 127/67  Pulse: 89 93  Resp: 18 18  Temp: 36.9 C 36.9 C  SpO2: 97% 96%    Last Pain:  Vitals:   06/16/19 1714  TempSrc:   PainSc: 7                  Catalina Gravel

## 2019-06-16 NOTE — Progress Notes (Signed)
  Echocardiogram Echocardiogram Transesophageal has been performed.  Robert Sullivan 06/16/2019, 12:51 PM

## 2019-06-16 NOTE — Transfer of Care (Signed)
Immediate Anesthesia Transfer of Care Note  Patient: Robert Sullivan  Procedure(s) Performed: TRANSESOPHAGEAL ECHOCARDIOGRAM (TEE) (N/A )  Patient Location: Endoscopy Unit  Anesthesia Type:MAC  Level of Consciousness: drowsy, patient cooperative and responds to stimulation  Airway & Oxygen Therapy: Patient Spontanous Breathing and Patient connected to nasal cannula oxygen  Post-op Assessment: Report given to RN and Post -op Vital signs reviewed and stable  Post vital signs: Reviewed and stable  Last Vitals:  Vitals Value Taken Time  BP    Temp    Pulse    Resp    SpO2      Last Pain:  Vitals:   06/16/19 1117  TempSrc: Temporal  PainSc: 9       Patients Stated Pain Goal: 2 (30/16/01 0932)  Complications: No apparent anesthesia complications

## 2019-06-17 ENCOUNTER — Inpatient Hospital Stay (HOSPITAL_COMMUNITY): Payer: Self-pay

## 2019-06-17 ENCOUNTER — Encounter (HOSPITAL_COMMUNITY): Payer: Self-pay | Admitting: Radiology

## 2019-06-17 DIAGNOSIS — Z978 Presence of other specified devices: Secondary | ICD-10-CM

## 2019-06-17 DIAGNOSIS — L0231 Cutaneous abscess of buttock: Secondary | ICD-10-CM

## 2019-06-17 HISTORY — PX: IR US GUIDE BX ASP/DRAIN: IMG2392

## 2019-06-17 LAB — CBC
HCT: 36 % — ABNORMAL LOW (ref 39.0–52.0)
Hemoglobin: 12.3 g/dL — ABNORMAL LOW (ref 13.0–17.0)
MCH: 31 pg (ref 26.0–34.0)
MCHC: 34.2 g/dL (ref 30.0–36.0)
MCV: 90.7 fL (ref 80.0–100.0)
Platelets: 135 10*3/uL — ABNORMAL LOW (ref 150–400)
RBC: 3.97 MIL/uL — ABNORMAL LOW (ref 4.22–5.81)
RDW: 14.6 % (ref 11.5–15.5)
WBC: 13.2 10*3/uL — ABNORMAL HIGH (ref 4.0–10.5)
nRBC: 0 % (ref 0.0–0.2)

## 2019-06-17 LAB — COMPREHENSIVE METABOLIC PANEL
ALT: 27 U/L (ref 0–44)
AST: 80 U/L — ABNORMAL HIGH (ref 15–41)
Albumin: 1.3 g/dL — ABNORMAL LOW (ref 3.5–5.0)
Alkaline Phosphatase: 300 U/L — ABNORMAL HIGH (ref 38–126)
Anion gap: 11 (ref 5–15)
BUN: 18 mg/dL (ref 6–20)
CO2: 22 mmol/L (ref 22–32)
Calcium: 7.9 mg/dL — ABNORMAL LOW (ref 8.9–10.3)
Chloride: 101 mmol/L (ref 98–111)
Creatinine, Ser: 0.59 mg/dL — ABNORMAL LOW (ref 0.61–1.24)
GFR calc Af Amer: >60 mL/min (ref >60)
GFR calc non Af Amer: >60 mL/min (ref >60)
Glucose, Bld: 173 mg/dL — ABNORMAL HIGH (ref 70–99)
Potassium: 4 mmol/L (ref 3.5–5.1)
Sodium: 134 mmol/L — ABNORMAL LOW (ref 135–145)
Total Bilirubin: 1.4 mg/dL — ABNORMAL HIGH (ref 0.3–1.2)
Total Protein: 6.4 g/dL — ABNORMAL LOW (ref 6.5–8.1)

## 2019-06-17 LAB — GLUCOSE, CAPILLARY
Glucose-Capillary: 153 mg/dL — ABNORMAL HIGH (ref 70–99)
Glucose-Capillary: 153 mg/dL — ABNORMAL HIGH (ref 70–99)
Glucose-Capillary: 145 mg/dL — ABNORMAL HIGH (ref 70–99)
Glucose-Capillary: 162 mg/dL — ABNORMAL HIGH (ref 70–99)

## 2019-06-17 IMAGING — US IR US GUIDANCE
1 series · 4 of 4 positions shown · non-contrast
Comparison: Left hip MRI-[DATE];

INDICATION: Indeterminate left gluteal fluid collection worrisome for abscess.
Please perform ultrasound-guided aspiration and/or drainage catheter
placement for diagnostic and therapeutic purposes.

EXAM:
ULTRASOUND-GUIDED LEFT GLUTEAL ABSCESS DRAINAGE CATHETER PLACEMENT

[Series 1: ir us guidance · 4 of 4 slices shown]
[im 1/4]
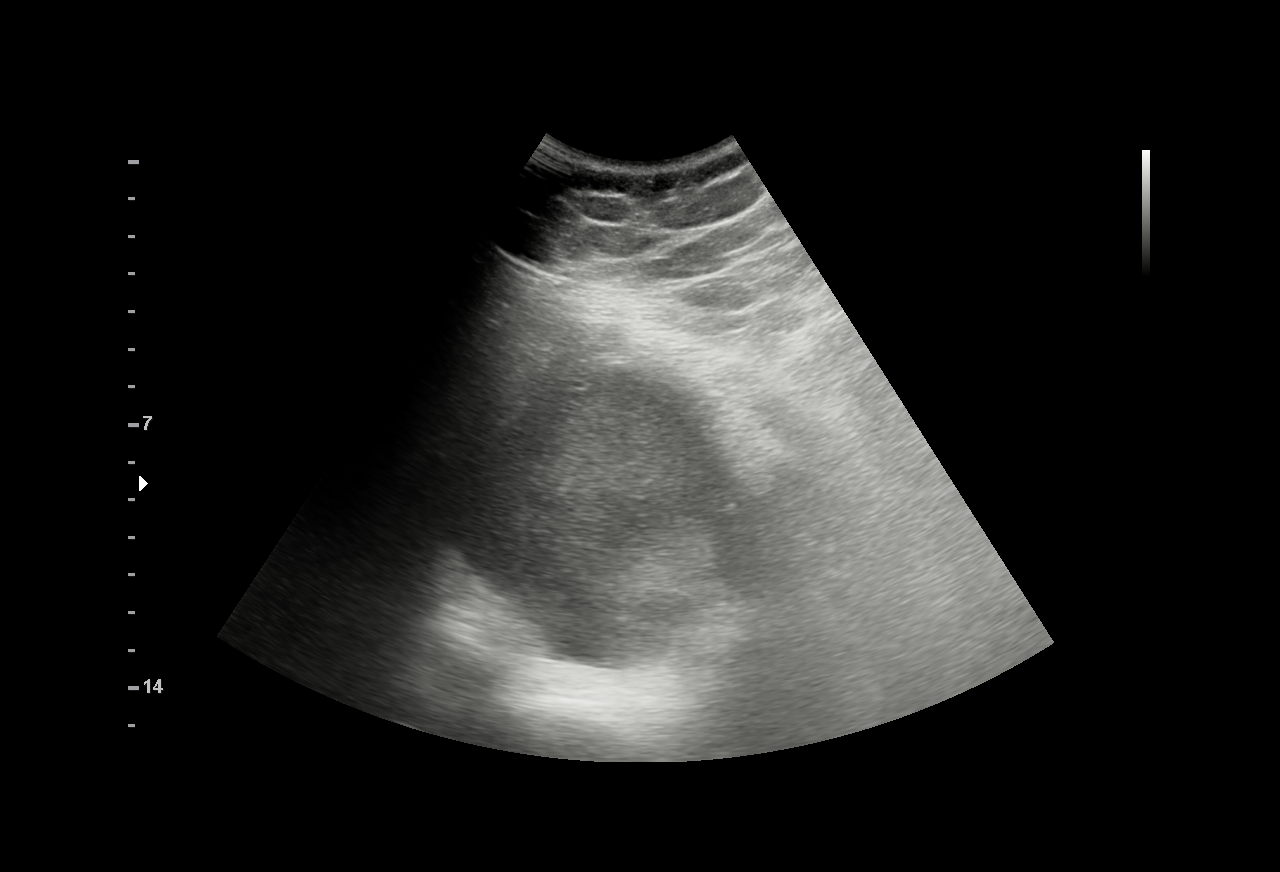
[im 2/4]
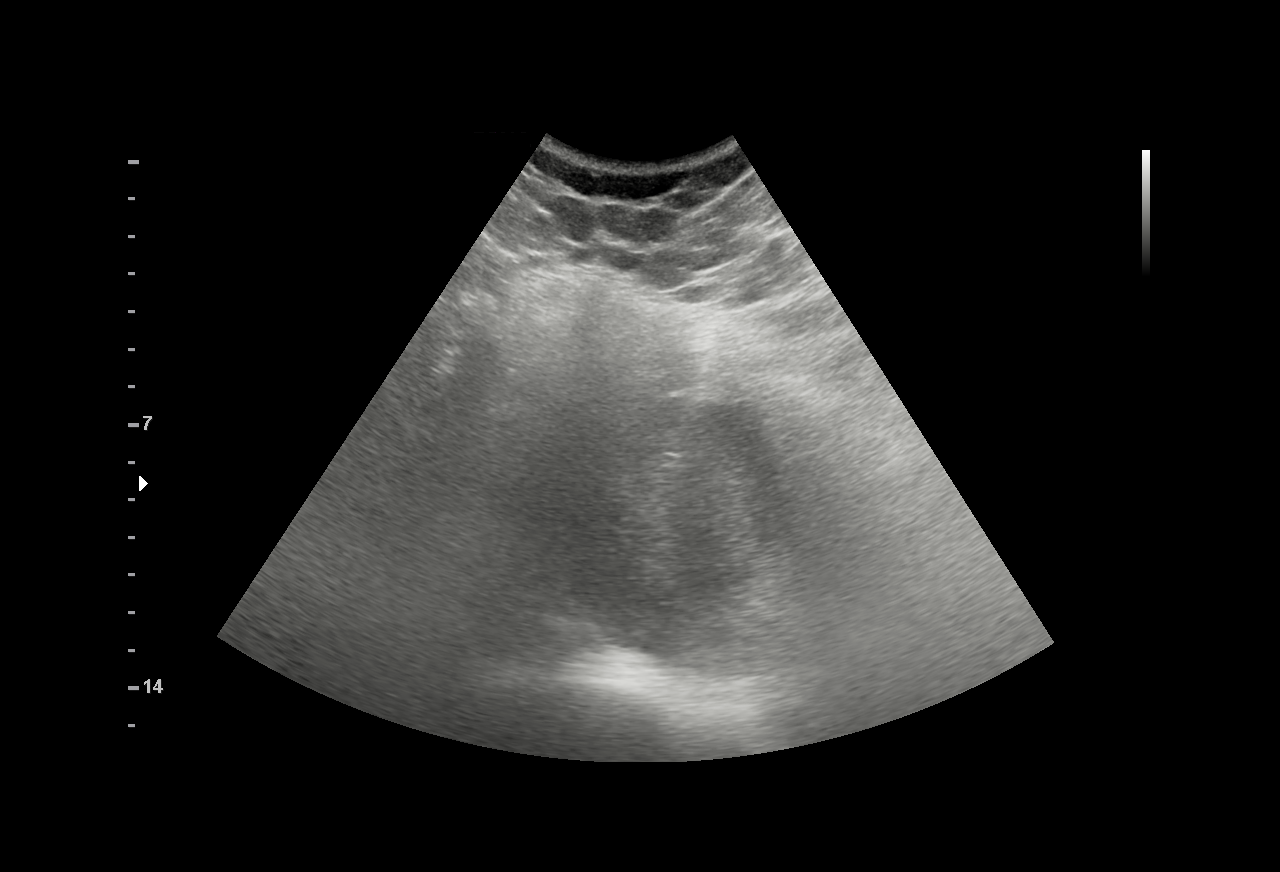
[im 3/4]
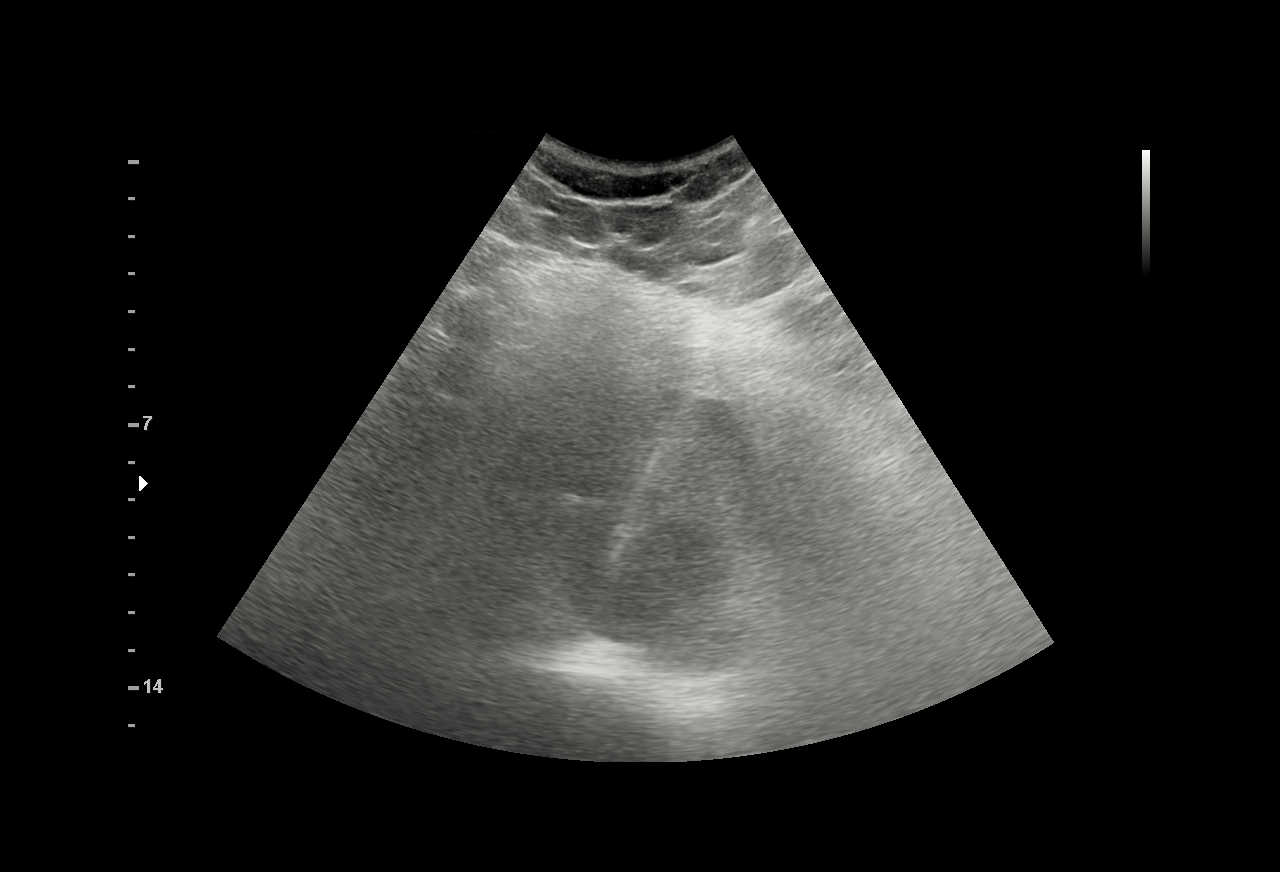
[im 4/4]
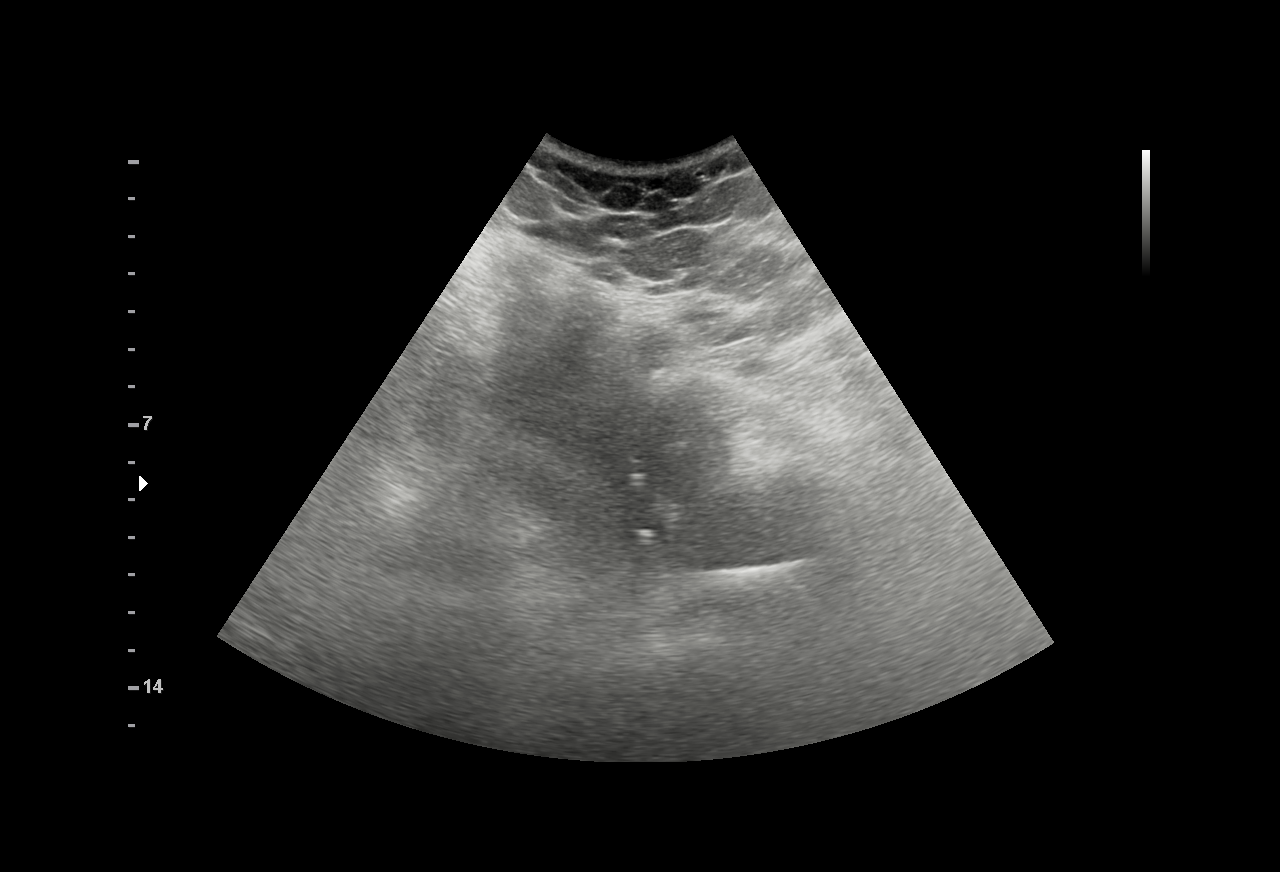

[4 of 4 positions shown; findings below may reference images not displayed]

left hip CT-[DATE]

MEDICATIONS:
The patient is currently admitted to the hospital and receiving
intravenous antibiotics. The antibiotics were administered within an
appropriate time frame prior to the initiation of the procedure.

ANESTHESIA/SEDATION:
Moderate (conscious) sedation was employed during this procedure. A
total of Versed 2.5 mg and Fentanyl 125 mcg was administered
intravenously.

Moderate Sedation Time: 22 minutes. The patient's level of
consciousness and vital signs were monitored continuously by
radiology nursing throughout the procedure under my direct
supervision.

CONTRAST:  None

COMPLICATIONS:
None immediate.

PROCEDURE:
Informed written consent was obtained from the patient after a
discussion of the risks, benefits and alternatives to treatment. The
patient was placed slightly RPO on his hospital bed (patient was
unable to tolerate positioning on the CT table or prone positioning)
with preprocedural imaging demonstrating a complex at least 7.7 x
7.3 cm fluid collection within the deep tissues of the right
buttocks (image 2). The procedure was planned. A timeout was
performed prior to the initiation of the procedure.

The posterolateral aspect of the left buttocks was prepped and
draped in the usual sterile fashion. The overlying soft tissues were
anesthetized with 1% lidocaine with epinephrine.

Under direct ultrasound guidance, the fluid collection was accessed
with an 18 gauge trocar needle and a short Amplatz wire was coiled
within the collection. Multiple ultrasound images were saved for
procedural documentation purposes

Next, the track was dilated allowing placement of a 10 French
all-purpose drainage catheter. Following drainage catheter
placement, approximately 400 cc of pink colored purulent appearing
fluid was aspirated. A sample of aspirated fluid was capped and sent
to the laboratory for analysis.

The drainage catheter was connected to a JP bulb and secured in
place within interrupted suture and a Stat Lock device. A dressing
was applied. The patient tolerated the procedure well without
immediate postprocedural complication.
IMPRESSION: Successful ultrasound guided placement of a 10 French all purpose
drain catheter into the left gluteal abscess with aspiration of 400
cc of pink colored, purulent appearing fluid. A representative
aspirated sample was capped and sent to the laboratory for analysis.

## 2019-06-17 MED ORDER — LIDOCAINE HCL 1 % IJ SOLN
INTRAMUSCULAR | Status: AC | PRN
  Administered 2019-06-17: 10 mL

## 2019-06-17 MED ORDER — MIDAZOLAM HCL 2 MG/2ML IJ SOLN
INTRAMUSCULAR | Status: AC
  Filled 2019-06-17: qty 2

## 2019-06-17 MED ORDER — HEPARIN SODIUM (PORCINE) 5000 UNIT/ML IJ SOLN
5000.0000 [IU] | Freq: Three times a day (TID) | INTRAMUSCULAR | Status: DC
  Administered 2019-06-17 – 2019-06-21 (×11): 5000 [IU] via SUBCUTANEOUS
  Filled 2019-06-17 (×11): qty 1

## 2019-06-17 MED ORDER — SODIUM CHLORIDE 0.9 % IV SOLN
INTRAVENOUS | Status: DC | PRN
  Administered 2019-06-17: 500 mL via INTRAVENOUS

## 2019-06-17 MED ORDER — FENTANYL CITRATE (PF) 100 MCG/2ML IJ SOLN
INTRAMUSCULAR | Status: AC | PRN
  Administered 2019-06-17: 50 ug via INTRAVENOUS
  Administered 2019-06-17 (×3): 25 ug via INTRAVENOUS

## 2019-06-17 MED ORDER — MIDAZOLAM HCL 2 MG/2ML IJ SOLN
INTRAMUSCULAR | Status: AC | PRN
  Administered 2019-06-17: 1 mg via INTRAVENOUS
  Administered 2019-06-17 (×3): 0.5 mg via INTRAVENOUS

## 2019-06-17 MED ORDER — DIPHENHYDRAMINE-ZINC ACETATE 2-0.1 % EX CREA
TOPICAL_CREAM | Freq: Three times a day (TID) | CUTANEOUS | Status: DC | PRN
  Filled 2019-06-17: qty 28

## 2019-06-17 MED ORDER — FENTANYL CITRATE (PF) 100 MCG/2ML IJ SOLN
INTRAMUSCULAR | Status: AC
  Filled 2019-06-17: qty 2

## 2019-06-17 MED ORDER — LIDOCAINE HCL 1 % IJ SOLN
INTRAMUSCULAR | Status: AC
  Filled 2019-06-17: qty 20

## 2019-06-17 MED ORDER — LIDOCAINE-EPINEPHRINE 1 %-1:100000 IJ SOLN
INTRAMUSCULAR | Status: AC
  Filled 2019-06-17: qty 1

## 2019-06-17 MED ORDER — SODIUM CHLORIDE 0.9% FLUSH
5.0000 mL | Freq: Three times a day (TID) | INTRAVENOUS | Status: DC
  Administered 2019-06-17 – 2019-06-21 (×10): 5 mL

## 2019-06-17 NOTE — Procedures (Signed)
Pre procedural Dx: Left gluteal abscess Post procedural Dx: Same  Technically successful US guided placed of a 10 Fr drainage catheter placement into the left gluteal abscess yielding 400 cc of pink colored purulent fluid.    An aspirated sample was sent to the laboratory for analysis.    EBL: None Complications: None immediate  Ronny Bacon, MD Pager #: [phone]

## 2019-06-17 NOTE — Progress Notes (Signed)
[facility] for Infectious Disease   Reason for visit: Follow up on MSSA BSI/LT hip abscess  Antibiotics: Cefazolin, day 7 + 1 day of vanc/ctx  Interval History: Pt underwent percutaneous drainage of LT hip abscess with ~400 cc of seropurulent fluid immediately returned and entire JP bulb emptied twice over since returning to the floor. Hip pain improving and now states he will make better efforts to ambulate. Son present at bedside during exam today. Fever curve, WBC & Cr trends, imaging, cx results, and ABX usage all independently reviewed.    Current Facility-Administered Medications:  .  0.9 %  sodium chloride infusion, , Intravenous, PRN, Donne Hazel, MD, Last Rate: 10 mL/hr at 06/17/19 0545, 500 mL at 06/17/19 0545 .  acetaminophen (TYLENOL) tablet 650 mg, 650 mg, Oral, Q6H PRN, Dorothy Spark, MD, 650 mg at 06/13/19 1719 .  bisacodyl (DULCOLAX) suppository 10 mg, 10 mg, Rectal, Once, [name] H, MD .  ceFAZolin (ANCEF) IVPB 2g/100 mL premix, 2 g, Intravenous, Q8H, Dorothy Spark, MD, Last Rate: 200 mL/hr at 06/17/19 1333, 2 g at 06/17/19 1333 .  cholecalciferol (VITAMIN D3) tablet 2,000 Units, 2,000 Units, Oral, Daily, Dorothy Spark, MD, 2,000 Units at 06/16/19 [phone] .  diphenhydrAMINE-zinc acetate (BENADRYL) 2-0.1 % cream, , Topical, TID PRN, Donne Hazel, MD .  fentaNYL (SUBLIMAZE) 100 MCG/2ML injection, , , ,  .  fentaNYL (SUBLIMAZE) 100 MCG/2ML injection, , , ,  .  heparin injection 5,000 Units, 5,000 Units, Subcutaneous, Q8H, Pierce, Dwayne A, RPH .  HYDROmorphone (DILAUDID) injection 0.5 mg, 0.5 mg, Intravenous, Q4H PRN, Dorothy Spark, MD, 0.5 mg at 06/17/19 0647 .  insulin aspart (novoLOG) injection 0-15 Units, 0-15 Units, Subcutaneous, TID WC, Dorothy Spark, MD, 3 Units at 06/17/19 1157 .  insulin aspart (novoLOG) injection 0-5 Units, 0-5 Units, Subcutaneous, QHS, Dorothy Spark, MD, 2 Units at 06/15/19 2129 .  insulin aspart  (novoLOG) injection 8 Units, 8 Units, Subcutaneous, TID WC, Dorothy Spark, MD, 8 Units at 06/17/19 1157 .  insulin glargine (LANTUS) injection 25 Units, 25 Units, Subcutaneous, Daily, Dorothy Spark, MD, 25 Units at 06/16/19 2304 .  lidocaine (XYLOCAINE) 1 % (with pres) injection, , , ,  .  metoprolol tartrate (LOPRESSOR) injection 5 mg, 5 mg, Intravenous, Q5 min PRN, Dorothy Spark, MD, 5 mg at 06/15/19 1811 .  midazolam (VERSED) 2 MG/2ML injection, , , ,  .  midazolam (VERSED) 2 MG/2ML injection, , , ,  .  multivitamin with minerals tablet 1 tablet, 1 tablet, Oral, Daily, Dorothy Spark, MD, 1 tablet at 06/16/19 [phone] .  oxyCODONE-acetaminophen (PERCOCET/ROXICET) 5-325 MG per tablet 1 tablet, 1 tablet, Oral, Q4H PRN, Dorothy Spark, MD, 1 tablet at 06/17/19 1156 .  protein supplement (ENSURE MAX) liquid, 11 oz, Oral, Daily, Dorothy Spark, MD, 11 oz at 06/15/19 1421 .  sodium chloride flush (NS) 0.9 % injection 5 mL, 5 mL, Intracatheter, Q8H, Sandi Mariscal, MD, 5 mL at 06/17/19 1333 .  vitamin C (ASCORBIC ACID) tablet 1,000 mg, 1,000 mg, Oral, Daily, Dorothy Spark, MD, 1,000 mg at 06/16/19 0835   Physical Exam:   Vitals:   06/17/19 1200 06/17/19 1628  BP:  123/61  Pulse: (!) 102 91  Resp:  18  Temp:  98.2 F (36.8 C)  SpO2:  96%   Physical Exam Lines: PIV, LT hip perc drain Gen: morbidly obese, mild distress secondary to LT hip pain, A&Ox  3, less anxious than days prior Head: NCAT, no temporal wasting evident EENT: PERRL, EOMI, MMM, adequate dentition Neck: supple, no JVD CV: tachycardic, RR, no murmurs evident Pulm: CTA bilaterally, no wheeze or retractions Abd: soft,obese with large pannus,  NTND, +BS Extrems: 1+ pitting  LE edema, 2+ pulses MSK: LT hip pain and ROM improving, percutaneous LT hip drain intact with thick purulent secretions noted in JP bulb Skin: petechial rash to RT ankle and upper arm improving but new LT arm linear petechial rash now  present, adequate skin turgor Neuro: CN II-XII grossly intact, no focal neurologic deficits appreciated, gait was not assessed, A&Ox 3   Review of Systems:  Review of Systems  Constitutional: Positive for malaise/fatigue. Negative for chills, fever and weight loss.  HENT: Negative for congestion, hearing loss, sinus pain and sore throat.   Eyes: Negative for blurred vision, photophobia and discharge.  Respiratory: Negative for cough, hemoptysis and shortness of breath.   Cardiovascular: Positive for leg swelling. Negative for chest pain, palpitations and orthopnea.  Gastrointestinal: Positive for constipation. Negative for abdominal pain, diarrhea, heartburn, nausea and vomiting.  Genitourinary: Negative for dysuria, flank pain, frequency and urgency.  Musculoskeletal: Positive for joint pain. Negative for back pain and myalgias.  Skin: Negative for itching and rash.  Neurological: Positive for weakness. Negative for tremors, seizures and headaches.  Endo/Heme/Allergies: Negative for polydipsia. Does not bruise/bleed easily.  Psychiatric/Behavioral: Negative for depression and substance abuse. The patient is not nervous/anxious and does not have insomnia.      Lab Results  Component Value Date   WBC 13.2 (H) 06/17/2019   HGB 12.3 (L) 06/17/2019   HCT 36.0 (L) 06/17/2019   MCV 90.7 06/17/2019   PLT 135 (L) 06/17/2019    Lab Results  Component Value Date   CREATININE 0.59 (L) 06/17/2019   BUN 18 06/17/2019   NA 134 (L) 06/17/2019   K 4.0 06/17/2019   CL 101 06/17/2019   CO2 22 06/17/2019    Lab Results  Component Value Date   ALT 27 06/17/2019   AST 80 (H) 06/17/2019   ALKPHOS 300 (H) 06/17/2019     Microbiology: Recent Results (from the past 240 hour(s))  Blood culture (routine x 2)     Status: Abnormal   Collection Time: 06/10/19  1:25 PM   Specimen: BLOOD  Result Value Ref Range Status   Specimen Description BLOOD LEFT ANTECUBITAL  Final   Special Requests   Final     BOTTLES DRAWN AEROBIC AND ANAEROBIC Blood Culture adequate volume   Culture  Setup Time   Final    GRAM POSITIVE COCCI IN CLUSTERS IN BOTH AEROBIC AND ANAEROBIC BOTTLES CRITICAL RESULT CALLED TO, READ BACK BY AND VERIFIED WITH: K. AMEND PHARMD, AT [place] 06/11/19 BY D. VANHOOK    Culture (A)  Final    STAPHYLOCOCCUS AUREUS SUSCEPTIBILITIES PERFORMED ON PREVIOUS CULTURE WITHIN THE LAST 5 DAYS. Performed at [facility], [place] [address] [place], [place] [address]    Report Status 06/13/2019 FINAL  Final  Blood culture (routine x 2)     Status: Abnormal   Collection Time: 06/10/19  1:35 PM   Specimen: BLOOD  Result Value Ref Range Status   Specimen Description BLOOD BLOOD RIGHT FOREARM  Final   Special Requests   Final    BOTTLES DRAWN AEROBIC AND ANAEROBIC Blood Culture adequate volume   Culture  Setup Time   Final    GRAM POSITIVE COCCI AEROBIC BOTTLE ONLY CRITICAL  VALUE NOTED.  VALUE IS CONSISTENT WITH PREVIOUSLY REPORTED AND CALLED VALUE. Performed at [facility], [place] [address] [place], [place] [address]    Culture STAPHYLOCOCCUS AUREUS (A)  Final   Report Status 06/13/2019 FINAL  Final   Organism ID, Bacteria STAPHYLOCOCCUS AUREUS  Final      Susceptibility   Staphylococcus aureus - MIC*    CIPROFLOXACIN <=0.5 SENSITIVE Sensitive     ERYTHROMYCIN <=0.25 SENSITIVE Sensitive     GENTAMICIN <=0.5 SENSITIVE Sensitive     OXACILLIN 0.5 SENSITIVE Sensitive     TETRACYCLINE >=16 RESISTANT Resistant     VANCOMYCIN 1 SENSITIVE Sensitive     TRIMETH/SULFA <=10 SENSITIVE Sensitive     CLINDAMYCIN <=0.25 SENSITIVE Sensitive     RIFAMPIN <=0.5 SENSITIVE Sensitive     Inducible Clindamycin NEGATIVE Sensitive     * STAPHYLOCOCCUS AUREUS  Blood Culture ID Panel (Reflexed)     Status: Abnormal   Collection Time: 06/10/19  1:35 PM  Result Value Ref Range Status   Enterococcus species NOT DETECTED NOT DETECTED Final   Listeria monocytogenes NOT DETECTED NOT DETECTED Final    Staphylococcus species DETECTED (A) NOT DETECTED Final    Comment: CRITICAL RESULT CALLED TO, READ BACK BY AND VERIFIED WITH: Andres Shad PharmD 14:55 06/11/19 ([email or web address])    Staphylococcus aureus (BCID) DETECTED (A) NOT DETECTED Final    Comment: Methicillin (oxacillin) susceptible Staphylococcus aureus (MSSA). Preferred therapy is anti staphylococcal beta lactam antibiotic (Cefazolin or Nafcillin), unless clinically contraindicated. CRITICAL RESULT CALLED TO, READ BACK BY AND VERIFIED WITH: Andres Shad PharmD 14:55 06/11/19 ([email or web address])    Methicillin resistance NOT DETECTED NOT DETECTED Final   Streptococcus species NOT DETECTED NOT DETECTED Final   Streptococcus agalactiae NOT DETECTED NOT DETECTED Final   Streptococcus pneumoniae NOT DETECTED NOT DETECTED Final   Streptococcus pyogenes NOT DETECTED NOT DETECTED Final   Acinetobacter baumannii NOT DETECTED NOT DETECTED Final   Enterobacteriaceae species NOT DETECTED NOT DETECTED Final   Enterobacter cloacae complex NOT DETECTED NOT DETECTED Final   Escherichia coli NOT DETECTED NOT DETECTED Final   Klebsiella oxytoca NOT DETECTED NOT DETECTED Final   Klebsiella pneumoniae NOT DETECTED NOT DETECTED Final   Proteus species NOT DETECTED NOT DETECTED Final   Serratia marcescens NOT DETECTED NOT DETECTED Final   Haemophilus influenzae NOT DETECTED NOT DETECTED Final   Neisseria meningitidis NOT DETECTED NOT DETECTED Final   Pseudomonas aeruginosa NOT DETECTED NOT DETECTED Final   Candida albicans NOT DETECTED NOT DETECTED Final   Candida glabrata NOT DETECTED NOT DETECTED Final   Candida krusei NOT DETECTED NOT DETECTED Final   Candida parapsilosis NOT DETECTED NOT DETECTED Final   Candida tropicalis NOT DETECTED NOT DETECTED Final    Comment: Performed at [facility], [address] [place], [place] [address]  Urine culture     Status: Abnormal   Collection Time: 06/10/19  1:45 PM   Specimen: Urine, Clean Catch  Result Value Ref Range  Status   Specimen Description URINE, CLEAN CATCH  Final   Special Requests   Final    URINE, RANDOM Performed at [facility], [place] [address], [place], [place] [address]    Culture >=100,000 COLONIES/mL STAPHYLOCOCCUS AUREUS (A)  Final   Report Status 06/12/2019 FINAL  Final   Organism ID, Bacteria STAPHYLOCOCCUS AUREUS (A)  Final      Susceptibility   Staphylococcus aureus - MIC*    CIPROFLOXACIN <=0.5 SENSITIVE Sensitive     GENTAMICIN <=0.5  SENSITIVE Sensitive     NITROFURANTOIN 32 SENSITIVE Sensitive     OXACILLIN 0.5 SENSITIVE Sensitive     TETRACYCLINE >=16 RESISTANT Resistant     VANCOMYCIN 1 SENSITIVE Sensitive     TRIMETH/SULFA <=10 SENSITIVE Sensitive     CLINDAMYCIN <=0.25 SENSITIVE Sensitive     RIFAMPIN <=0.5 SENSITIVE Sensitive     Inducible Clindamycin NEGATIVE Sensitive     * >=100,000 COLONIES/mL STAPHYLOCOCCUS AUREUS  SARS CORONAVIRUS 2 (TAT 6-24 HRS) Nasopharyngeal Nasopharyngeal Swab     Status: None   Collection Time: 06/10/19  6:40 PM   Specimen: Nasopharyngeal Swab  Result Value Ref Range Status   SARS Coronavirus 2 NEGATIVE NEGATIVE Final    Comment: (NOTE) SARS-CoV-2 target nucleic acids are NOT DETECTED. The SARS-CoV-2 RNA is generally detectable in upper and lower respiratory specimens during the acute phase of infection. Negative results do not preclude SARS-CoV-2 infection, do not rule out co-infections with other pathogens, and should not be used as the sole basis for treatment or other patient management decisions. Negative results must be combined with clinical observations, patient history, and epidemiological information. The expected result is Negative. Fact Sheet for Patients: SugarRoll.be Fact Sheet for Healthcare Providers: https://www.woods-mathews.com/ This test is not yet approved or cleared by the [place] FDA and  has been authorized for detection and/or diagnosis of SARS-CoV-2 by  FDA under an Emergency Use Authorization (EUA). This EUA will remain  in effect (meaning this test can be used) for the duration of the COVID-19 declaration under Section 56 4(b)(1) of the Act, 21 U.S.C. section 360bbb-3(b)(1), unless the authorization is terminated or revoked sooner. Performed at [facility], [place] [address], [place], [place] [address]   Culture, blood (routine x 2)     Status: None   Collection Time: 06/11/19  4:35 PM   Specimen: BLOOD LEFT HAND  Result Value Ref Range Status   Specimen Description BLOOD LEFT HAND  Final   Special Requests   Final    BOTTLES DRAWN AEROBIC AND ANAEROBIC Blood Culture adequate volume   Culture   Final    NO GROWTH 5 DAYS Performed at [facility], [place] [address] [place], [place] [address]    Report Status 06/16/2019 FINAL  Final  Culture, blood (routine x 2)     Status: None   Collection Time: 06/11/19  4:40 PM   Specimen: BLOOD RIGHT HAND  Result Value Ref Range Status   Specimen Description BLOOD RIGHT HAND  Final   Special Requests   Final    BOTTLES DRAWN AEROBIC ONLY Blood Culture adequate volume   Culture   Final    NO GROWTH 5 DAYS Performed at [facility], [place] [address], [place], [place] [address]    Report Status 06/16/2019 FINAL  Final  Aerobic/Anaerobic Culture (surgical/deep wound)     Status: None (Preliminary result)   Collection Time: 06/17/19 11:27 AM   Specimen: Abscess  Result Value Ref Range Status   Specimen Description ABSCESS LEFT GLUTEAL  Final   Special Requests Normal  Final   Gram Stain   Final    ABUNDANT WBC PRESENT,BOTH PMN AND MONONUCLEAR ABUNDANT GRAM POSITIVE COCCI Performed at [facility], [place] [address] [place], [place] [address]    Culture PENDING  Incomplete   Report Status PENDING  Incomplete    Impression/Plan: Patient is a 53 year old-year-old morbidly obese white male with poorly controlled diabetes and recent multiple falls at home resulting in  a  left gluteal hematoma now presenting with MSSA bacteremia with MSSA bacteremia, leukocytosis, and probable left hip septic joint infection/abscess.  1.  MSSA bacteremia-the patient has now developed clearance of his bacteremia on both sets of his repeat blood cultures cultures which showed clearance.  Continue cefazolin 2 g IV every 8 hours for now.  Yesterday's TEE showed no evidence of endocarditis.  The duration of the patient's antibiotics in total will be 4 weeks from the time of aspiration or preferably debridement to his left hip for primary source control.  2.  Left hip abscess versus septic arthritis -while not visible on on the patient's initial CT scan, his MRI performed last Friday confirmed a large 22 cm loculated hematoma/abscess.Marland Kitchen  He underwent successful placement of a percutaneous drain via IR today with an extremely large return of greater than 400 C cc of purulent fluid almost immediately.  He has required emptying of his JP drain at least twice in the 1 to 2 hours he has been on the floor since that time.  Given the patient's loculations and extremely large abscess cavity, I still feel he would benefit from a debridement to his left hip for optimal primary source control.  Patient states that he is willing to undergo this procedure if needed in order to avoid further complications.  Given the patient's appearance of a petechial rash and persistent leukocytosis along with MSSA bacteremia, I would appreciate if the orthopedist would consider additional intervention.  I have stressed to the patient the importance of weightbearing in the next day or 2 to best assess the probability of a septic joint.  The patient's extremely high weight and complex infection, will place him at a higher risk for fracture if not properly addressed.  3.  Leukocytosis - the patient's white blood cell count has been slowly improving with further ongoing antibiotics targeted towards his MSSA bacteremia.  We will  continue to check patient's CBC with differential daily to follow his white blood cell count trend, particularly following his percutaneous drain placement.  The patient is already on optimal antibiotic therapy, so any worsening would suggest inadequately drained abscess/infection.  4.  Poorly controlled diabetes-the patient has had significant improvement in his glycemic control during this hospitalization.  I appreciate the hospitalists effort to maintain blood sugar close to 150 or lower to optimize his infectious outcome, I have stressed the importance of diabetic education heavily with the patient.  As discharge nears, he would benefit from a session with a diabetic educator as well.

## 2019-06-17 NOTE — Plan of Care (Signed)
  Problem: Education: Goal: Knowledge of General Education information will improve Description Including pain rating scale, medication(s)/side effects and non-pharmacologic comfort measures Outcome: Progressing   

## 2019-06-17 NOTE — Progress Notes (Signed)
Chief Complaint: Patient was seen in consultation today for gluteal abscess drainage  Referring Physician(s): Hilbert Odor PA-C  Supervising Physician: Sandi Mariscal  Patient Status: [facility] - In-pt  History of Present Illness: Robert Sullivan is a 53 y.o. male with left gluteal abscess. IR is asked to place perc drain. PMHx, meds, labs, imaging, allergies reviewed. Feels well, no recent fevers, chills, illness. Has been NPO today as directed.    Past Medical History:  Diagnosis Date  . Arthritis 05/13/2019   knees  . Diabetes mellitus without complication ([place])   . Headache   . Sleep apnea   . Sleep apnea     Past Surgical History:  Procedure Laterality Date  . TEE WITHOUT CARDIOVERSION N/A 06/16/2019   Procedure: TRANSESOPHAGEAL ECHOCARDIOGRAM (TEE);  Surgeon: Dorothy Spark, MD;  Location: [facility] ENDOSCOPY;  Service: Cardiovascular;  Laterality: N/A;    Allergies: Sulfa antibiotics  Medications:  Current Facility-Administered Medications:  .  0.9 %  sodium chloride infusion, , Intravenous, PRN, Donne Hazel, MD, Last Rate: 10 mL/hr at 06/17/19 0545, 500 mL at 06/17/19 0545 .  acetaminophen (TYLENOL) tablet 650 mg, 650 mg, Oral, Q6H PRN, Dorothy Spark, MD, 650 mg at 06/13/19 1719 .  bisacodyl (DULCOLAX) suppository 10 mg, 10 mg, Rectal, Once, [name] H, MD .  ceFAZolin (ANCEF) IVPB 2g/100 mL premix, 2 g, Intravenous, Q8H, Dorothy Spark, MD, Last Rate: 200 mL/hr at 06/17/19 0550, 2 g at 06/17/19 0550 .  cholecalciferol (VITAMIN D3) tablet 2,000 Units, 2,000 Units, Oral, Daily, Dorothy Spark, MD, 2,000 Units at 06/16/19 [phone] .  HYDROmorphone (DILAUDID) injection 0.5 mg, 0.5 mg, Intravenous, Q4H PRN, Dorothy Spark, MD, 0.5 mg at 06/17/19 0647 .  insulin aspart (novoLOG) injection 0-15 Units, 0-15 Units, Subcutaneous, TID WC, Dorothy Spark, MD, 3 Units at 06/16/19 1717 .  insulin aspart (novoLOG) injection 0-5 Units, 0-5 Units,  Subcutaneous, QHS, Dorothy Spark, MD, 2 Units at 06/15/19 2129 .  insulin aspart (novoLOG) injection 8 Units, 8 Units, Subcutaneous, TID WC, Dorothy Spark, MD, 8 Units at 06/16/19 1714 .  insulin glargine (LANTUS) injection 25 Units, 25 Units, Subcutaneous, Daily, Dorothy Spark, MD, 25 Units at 06/16/19 2304 .  lidocaine-EPINEPHrine (XYLOCAINE W/EPI) 1 %-1:100000 (with pres) injection, , , ,  .  metoprolol tartrate (LOPRESSOR) injection 5 mg, 5 mg, Intravenous, Q5 min PRN, Dorothy Spark, MD, 5 mg at 06/15/19 1811 .  multivitamin with minerals tablet 1 tablet, 1 tablet, Oral, Daily, Dorothy Spark, MD, 1 tablet at 06/16/19 [phone] .  oxyCODONE-acetaminophen (PERCOCET/ROXICET) 5-325 MG per tablet 1 tablet, 1 tablet, Oral, Q4H PRN, Dorothy Spark, MD, 1 tablet at 06/16/19 1714 .  protein supplement (ENSURE MAX) liquid, 11 oz, Oral, Daily, Dorothy Spark, MD, 11 oz at 06/15/19 1421 .  vitamin C (ASCORBIC ACID) tablet 1,000 mg, 1,000 mg, Oral, Daily, Dorothy Spark, MD, 1,000 mg at 06/16/19 6010    History reviewed. No pertinent family history.  Social History   Socioeconomic History  . Marital status: Married    Spouse name: Not on file  . Number of children: Not on file  . Years of education: Not on file  . Highest education level: Not on file  Occupational History  . Not on file  Social Needs  . Financial resource strain: Not on file  . Food insecurity    Worry: Not on file    Inability: Not on file  . Transportation needs  Medical: Not on file    Non-medical: Not on file  Tobacco Use  . Smoking status: Never Smoker  . Smokeless tobacco: Never Used  Substance and Sexual Activity  . Alcohol use: Not Currently  . Drug use: Not Currently  . Sexual activity: Not on file  Lifestyle  . Physical activity    Days per week: Not on file    Minutes per session: Not on file  . Stress: Not on file  Relationships  . Social Herbalist on phone:  Not on file    Gets together: Not on file    Attends religious service: Not on file    Active member of club or organization: Not on file    Attends meetings of clubs or organizations: Not on file    Relationship status: Not on file  Other Topics Concern  . Not on file  Social History Narrative  . Not on file     Review of Systems: A 12 point ROS discussed and pertinent positives are indicated in the HPI above.  All other systems are negative.  Review of Systems  Vital Signs: BP 113/69 (BP Location: Left Arm)   Pulse (!) 107   Temp 97.9 F (36.6 C) (Oral)   Resp 18   Ht 6' 3"  (1.905 m)   Wt (!) 170.2 kg   SpO2 92%   BMI 46.90 kg/m   Physical Exam Constitutional:      Appearance: Normal appearance. He is obese.  HENT:     Mouth/Throat:     Mouth: Mucous membranes are moist.     Pharynx: Oropharynx is clear.  Cardiovascular:     Rate and Rhythm: Normal rate and regular rhythm.     Heart sounds: Normal heart sounds.  Pulmonary:     Effort: Pulmonary effort is normal. No respiratory distress.     Breath sounds: Normal breath sounds.  Skin:    General: Skin is warm and dry.  Neurological:     General: No focal deficit present.     Mental Status: He is alert and oriented to person, place, and time.  Psychiatric:        Mood and Affect: Mood normal.        Thought Content: Thought content normal.        Judgment: Judgment normal.       Imaging: Ct Angio Chest Pe W And/or Wo Contrast  Result Date: 06/10/2019 CLINICAL DATA:  Chest pain. EXAM: CT ANGIOGRAPHY CHEST WITH CONTRAST TECHNIQUE: Multidetector CT imaging of the chest was performed using the standard protocol during bolus administration of intravenous contrast. Multiplanar CT image reconstructions and MIPs were obtained to evaluate the vascular anatomy. CONTRAST:  9m OMNIPAQUE IOHEXOL 350 MG/ML SOLN COMPARISON:  None. FINDINGS: Cardiovascular: There does not appear to be any definite evidence of large  central pulmonary embolus in the main pulmonary artery or main portions of the left or right pulmonary arteries. However, there is limited opacification of the more peripheral branches, and therefore smaller peripheral pulmonary emboli cannot be excluded on the basis of this exam. There is no evidence of thoracic aortic dissection or aneurysm. Normal cardiac size. No pericardial effusion. Mediastinum/Nodes: No enlarged mediastinal, hilar, or axillary lymph nodes. Thyroid gland, trachea, and esophagus demonstrate no significant findings. Lungs/Pleura: Lungs are clear. No pleural effusion or pneumothorax. Upper Abdomen: Mild splenomegaly is noted. Possible nodularity of liver is noted suggesting possible hepatic cirrhosis. Musculoskeletal: No chest wall abnormality. No acute or significant  osseous findings. Review of the MIP images confirms the above findings. IMPRESSION: 1. There is no definite evidence of large central pulmonary embolus in the main pulmonary artery or main portions of the left or right pulmonary arteries. However, there is limited opacification of the more peripheral branches, and therefore smaller peripheral pulmonary emboli cannot be excluded on the basis of this exam. 2. Mild splenomegaly is noted. Possible nodularity of liver is noted suggesting possible hepatic cirrhosis. Electronically Signed   By: Marijo Conception M.D.   On: 06/10/2019 16:19   Ct Hip Left Wo Contrast  Result Date: 06/10/2019 CLINICAL DATA:  Hip swelling and infection EXAM: CT OF THE LEFT HIP WITHOUT CONTRAST TECHNIQUE: Multidetector CT imaging of the left hip was performed according to the standard protocol. Multiplanar CT image reconstructions were also generated. COMPARISON:  May 14, 2019 MRI FINDINGS: Bones/Joint/Cartilage No fracture or dislocation. There is moderate bilateral hip osteoarthritis with superior joint space loss and marginal osteophyte formation. No large joint effusion is seen. Ligaments Suboptimally  assessed by CT. Muscles and Tendons Within the gluteal musculature there is enlargement and heterogeneous appearance to the gluteal musculature. No definite loculated fluid collection is seen, however limited due to technique. Soft tissues Overlying subcutaneous edema and soft tissue stranding which extends over the greater trochanter. IMPRESSION: Heterogeneous appearance with enlargement of the gluteal musculature with surrounding soft tissue inflammatory changes and skin thickening. No definite loculated fluid collection. This could be due to resolving hematoma or myositis. If further evaluation is required, would recommend MRI. Electronically Signed   By: Prudencio Pair M.D.   On: 06/10/2019 21:13   Mr Hip Left Wo Contrast  Result Date: 06/13/2019 CLINICAL DATA:  Hip pain and swelling, question of osteomyelitis EXAM: MR OF THE LEFT HIP WITHOUT CONTRAST TECHNIQUE: Multiplanar, multisequence MR imaging was performed. No intravenous contrast was administered. COMPARISON:  CT 06/10/2019 FINDINGS: Bones: There is no evidence of acute fracture, dislocation or avascular necrosis. No areas of cortical destruction or periosteal reaction is seen. The visualized bony pelvis appears normal. The visualized sacroiliac joints and symphysis pubis appear normal. Articular cartilage and labrum Articular cartilage: No focal chondral defect or subchondral signal abnormality identified. Labrum: There is no gross labral tear or paralabral abnormality. Joint or bursal effusion Joint effusion: No significant hip joint effusion. Bursae: No focal periarticular fluid collection. Muscles and tendons Muscles and tendons: There is a large multilocular cystic mass seen within the gluteal musculature measuring approximately 22 cm in craniocaudal dimension. The loculated fluid collection extends in anteriorly into the trochanteric bursa. There is scattered debris seen within the collection. Increased feathery signal seen throughout the  remainder of the muscles surrounding the hip. This is most notable within the abductor, piriformis, and psoas musculature. A however appear to be intact. There is limited visualization of the gluteal tendons, however they do appear to be intact. The hamstrings and iliopsoas tendons are intact. Other findings Miscellaneous: A moderate amount of ascites is present within the deep pelvis. IMPRESSION: 1. Large multilocular septated cystic mass within the gluteal musculature extending into the trochanteric bursa which could chronic organizing hematoma/seroma or intramuscular abscess. No evidence of osteomyelitis. 2. Diffuse muscular edema surrounding the hip, most notable within the psoas and adductor musculature 3. Moderate pelvic ascites. Electronically Signed   By: Prudencio Pair M.D.   On: 06/13/2019 20:52    Labs:  CBC: Recent Labs    06/14/19 0442 06/15/19 0545 06/16/19 0512 06/17/19 0611  WBC 19.8* 14.8* 14.2* 13.2*  HGB 12.0* 11.4* 11.2* 12.3*  HCT 35.0* 33.3* 32.8* 36.0*  PLT 148* 123* 123* 135*    COAGS: Recent Labs    06/10/19 1345 06/16/19 1656  INR 1.4* 1.4*  APTT 30  --     BMP: Recent Labs    06/14/19 0442 06/15/19 0545 06/16/19 0512 06/17/19 0611  NA 132* 129* 131* 134*  K 4.6 4.4 4.0 4.0  CL 101 100 100 101  CO2 22 22 23 22   GLUCOSE 186* 195* 209* 173*  BUN 20 17 18 18   CALCIUM 7.9* 7.8* 7.9* 7.9*  CREATININE 0.76 0.62 0.72 0.59*  GFRNONAA >60 >60 >60 >60  GFRAA >60 >60 >60 >60    LIVER FUNCTION TESTS: Recent Labs    06/14/19 0442 06/15/19 0545 06/16/19 0512 06/17/19 0611  BILITOT 1.5* 1.4* 1.2 1.4*  AST 88* 82* 81* 80*  ALT 32 29 28 27   ALKPHOS 282* 292* 299* 300*  PROT 6.2* 5.7* 5.9* 6.4*  ALBUMIN 1.3* 1.2* 1.2* 1.3*    TUMOR MARKERS: No results for input(s): AFPTM, CEA, CA199, CHROMGRNA in the last 8760 hours.  Assessment and Plan: Left gluteal abscess For IR drainage Risks and benefits discussed with the patient including bleeding,  infection, damage to adjacent structures, bowel perforation/fistula connection, and sepsis.  All of the patient's questions were answered, patient is agreeable to proceed. Consent signed and in chart.    Thank you for this interesting consult.  I greatly enjoyed meeting Einar Nolasco and look forward to participating in their care.  A copy of this report was sent to the requesting provider on this date.  Electronically Signed: Ascencion Dike, PA-C 06/17/2019, 9:44 AM   I spent a total of 20 minutes in face to face in clinical consultation, greater than 50% of which was counseling/coordinating care for abscess drain

## 2019-06-17 NOTE — Progress Notes (Signed)
Nutrition Follow up  DOCUMENTATION CODES:   Morbid obesity  INTERVENTION:    Continue Ensure MAX Protein po daily, each supplement provides 150 kcal and 30 grams of protein  Continue MVI daily   NUTRITION DIAGNOSIS:   Increased nutrient needs related to acute illness as evidenced by estimated needs.  Ongoing  GOAL:   Patient will meet greater than or equal to 90% of their needs   Progressing   MONITOR:   PO intake, Supplement acceptance, Weight trends, Labs, I & O's, Skin  REASON FOR ASSESSMENT:   Malnutrition Screening Tool    ASSESSMENT:   Patient with PMH significant for DM. Recently admitted to [facility] from 11/2-11/7 with E.Coli UTI. Presents this admission with sepsis from possible L gluteal cellulitis.   12/8- s/p IR drain placed L gluteal abscess  Pt reports appetite remains off/on. Meal completions charted as 0-75% for his last eight meals. Drinking Ensure MAX once daily.   A1C shows to be 11.0% pt not compliant with insulin at home. -RD provided "Carbohydrate Counting for People with Diabetes" handout from the Academy of [employer]. Discussed different food groups and their effects on blood sugar, emphasizing carbohydrate-containing foods. Provided list of carbohydrates and recommended serving sizes of common foods.Discussed importance of controlled and consistent carbohydrate intake throughout the day. Provided examples of ways to balance meals/snacks and encouraged intake of high-fiber, whole grain complex carbohydrates.    Admission weight: 172.4 kg  Current weight: 170.2 kg    Drips: abx Medications: dulcolax, Vit D, ss novolog, lantus, MVI with minerals, 1000 mg lasix Labs: Na 134 (L) CBG 153-190  Diet Order:   Diet Order            Diet Carb Modified Fluid consistency: Thin; Room service appropriate? Yes  Diet effective now              EDUCATION NEEDS:   Education needs have been addressed  Skin:  Skin Assessment: Skin  Integrity Issues: Skin Integrity Issues:: Other (Comment) Other: MASD- sacrum  Last BM:  12/7  Height:   Ht Readings from Last 1 Encounters:  06/16/19 6' 3"  (1.905 m)    Weight:   Wt Readings from Last 1 Encounters:  06/16/19 (!) 170.2 kg    Ideal Body Weight:  89.1 kg  BMI:  Body mass index is 46.9 kg/m.  Estimated Nutritional Needs:   Kcal:  2400-2600 kcal  Protein:  120-135 grams  Fluid:  >/= 2 L/day   Mariana Single RD, LDN Clinical Nutrition Pager # - [phone]

## 2019-06-17 NOTE — Progress Notes (Signed)
PROGRESS NOTE    Robert Sullivan  KGM:[id number] DOB: 11-19-1965 DOA: 06/10/2019 PCP: Cathleen Corti, PA-C    Brief Narrative:  52 y.o. male with history of uncontrolled type 2 diabetes mellitus, morbid obesity was admitted to [facility] from 11/2-11/7 with E. coli UTI, uncontrolled diabetes and a fall that resulted in a left gluteal hematoma, he was treated with IV antibiotics, discharged home on Bactrim, completed course and also on insulin. -He felt well initially after discharge, in the last 7 to 10 days he started having fevers and chills, profuse sweats, 5 to 6 days ago he started having increased pain and swelling at the site of his gluteal hematoma, he also noticed that his urine was darker but denied any dysuria or change in odor presented to the emergency room today with weakness, worsening left hip pain, fevers chills and sweats. -In the ED he was noted to have a sodium of 131, CBG of 380, albumin of 1.8, ALP of 224,  lactic acid of 5.5, white count of 20,000.  In the ED he was noted to have an elevated D-dimer subsequently EDP proceeded with CT angiogram which was negative for pulmonary embolism, his UA was significantly abnormal with cloudy urine, positive nitrite, leukocyte esterase, numerous WBC and bacteria.  In addition noted to have redness and tenderness of his left gluteal region  Assessment & Plan:   Active Problems:   Acute lower UTI   Sepsis ([place])   Uncontrolled diabetes mellitus (HCC)   Obesity, Class III, BMI 40-49.9 (morbid obesity) (HCC)   Cellulitis, gluteal, left  Sepsis secondary to MSSA bacteremia -Blood cx pos for MSSA, ID now consulted with recommendation for 2d echo to r/o endocarditis, neg findings -TEE performed 12/7 with no evidence of vegetations -currently on ancef per ID -CT reviewed, no drainable abscess identified in L hip. 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 Pt better able to ambulate -Appreciate Orthopedics input.  -Pt now s/p drain placement by IR 12/7. Follow culture results  Morbid obesity -BMI of 47 -diet/lifestyle modification recommended  Uncontrolled type 2 diabetes mellitus -Hemoglobin A1c was 11 recently -Resume Lantus and NovoLog premeal and sliding scale insulin -Glucose trends stable  Suspected cirrhosis -Noted on imaging, patient denies any alcohol use currently but did drink regularly in the past, suspect NASH/fatty liver disease is likely the primary culprit -Hep C neg -GI follow up recommended on d/c  DVT prophylaxis: Heparin subQ Code Status: Full Family Communication: Pt in room, family not at bedside Disposition Plan: Uncertain at this time  Consultants:   ID  Cardiology  Orthopedic Surgery  IR  Procedures:   TEE 12/7  IR guided drain placement to L thigh 12/8  Antimicrobials: Anti-infectives (From admission, onward)   Start     Dose/Rate Route Frequency Ordered Stop   06/11/19 2200  ceFAZolin (ANCEF) IVPB 2g/100 mL premix     2 g 200 mL/hr over 30 Minutes Intravenous Every 8 hours 06/11/19 1456  06/11/19 0200  vancomycin (VANCOCIN) 1,750 mg in sodium chloride 0.9 % 500 mL IVPB  Status:  Discontinued     1,750 mg 250 mL/hr over 120 Minutes Intravenous Every 12 hours 06/10/19 1257 06/11/19 1456   06/10/19 2200  ceFEPIme (MAXIPIME) 2 g in sodium chloride 0.9 % 100 mL IVPB  Status:  Discontinued     2  g 200 mL/hr over 30 Minutes Intravenous Every 8 hours 06/10/19 1257 06/10/19 1740   06/10/19 1745  cefTRIAXone (ROCEPHIN) 1 g in sodium chloride 0.9 % 100 mL IVPB  Status:  Discontinued     1 g 200 mL/hr over 30 Minutes Intravenous Every 24 hours 06/10/19 1740 06/11/19 1456   06/10/19 1245  ceFEPIme (MAXIPIME) 2 g in sodium chloride 0.9 % 100 mL IVPB     2 g 200 mL/hr over 30 Minutes Intravenous  Once 06/10/19 1239 06/10/19 1411   06/10/19 1245  vancomycin (VANCOCIN) IVPB 1000 mg/200 mL premix  Status:  Discontinued     1,000 mg 200 mL/hr over 60 Minutes Intravenous  Once 06/10/19 1239 06/10/19 1241   06/10/19 1245  vancomycin (VANCOCIN) 2,500 mg in sodium chloride 0.9 % 500 mL IVPB     2,500 mg 250 mL/hr over 120 Minutes Intravenous  Once 06/10/19 1241 06/10/19 1635      Subjective: Without complaints this AM  Objective: Vitals:   06/17/19 1040 06/17/19 1045 06/17/19 1050 06/17/19 1200  BP: 125/67 121/86 110/67   Pulse: (!) 123 (!) 120 (!) 111 (!) 102  Resp: 19 16 16    Temp:      TempSrc:      SpO2: 95% 97% 96%   Weight:      Height:        Intake/Output Summary (Last 24 hours) at 06/17/2019 1407 Last data filed at 06/17/2019 1216 Gross per 24 hour  Intake 1013.78 ml  Output 910 ml  Net 103.78 ml   Filed Weights   06/15/19 0100 06/16/19 0430 06/16/19 1117  Weight: (!) 173 kg (!) 170.2 kg (!) 170.2 kg    Examination: General exam: Awake, laying in bed, in nad Respiratory system: Normal respiratory effort, no wheezing Cardiovascular system: regular rate, s1, s2 Gastrointestinal system: Soft, nondistended, positive BS Central nervous system: CN2-12 grossly intact, strength intact Extremities: Perfused, no clubbing Skin: Normal skin turgor, no notable skin lesions seen Psychiatry: Mood normal // no visual hallucinations   Data Reviewed: I have personally reviewed following labs and imaging studies  CBC: Recent Labs  Lab 06/13/19 0236 06/14/19 0442 06/15/19  0545 06/16/19 0512 06/17/19 0611  WBC 18.3* 19.8* 14.8* 14.2* 13.2*  HGB 12.2* 12.0* 11.4* 11.2* 12.3*  HCT 35.4* 35.0* 33.3* 32.8* 36.0*  MCV 89.6 89.3 89.3 89.1 90.7  PLT 148* 148* 123* 123* 412*   Basic Metabolic Panel: Recent Labs  Lab 06/13/19 0236 06/14/19 0442 06/15/19 0545 06/16/19 0512 06/17/19 0611  NA 131* 132* 129* 131* 134*  K 5.0 4.6 4.4 4.0 4.0  CL 101 101 100 100 101  CO2 20* 22 22 23 22   GLUCOSE 173* 186* 195* 209* 173*  BUN 23* 20 17 18 18   CREATININE 0.78 0.76 0.62 0.72 0.59*  CALCIUM 8.2* 7.9* 7.8* 7.9* 7.9*   GFR: Estimated Creatinine Clearance: 181.5 mL/min (A) (by C-G formula based on SCr of 0.59 mg/dL (L)). Liver Function Tests: Recent Labs  Lab 06/13/19 0236 06/14/19 0442 06/15/19 0545 06/16/19 0512 06/17/19 0611  AST 82* 88* 82* 81* 80*  ALT 37 32  29 28 27   ALKPHOS 272* 282* 292* 299* 300*  BILITOT 1.2 1.5* 1.4* 1.2 1.4*  PROT 6.0* 6.2* 5.7* 5.9* 6.4*  ALBUMIN 1.3* 1.3* 1.2* 1.2* 1.3*   No results for input(s): LIPASE, AMYLASE in the last 168 hours. No results for input(s): AMMONIA in the last 168 hours. Coagulation Profile: Recent Labs  Lab 06/16/19 1656  INR 1.4*   Cardiac Enzymes: Recent Labs  Lab 06/12/19 0318  CKTOTAL 15*   BNP (last 3 results) No results for input(s): PROBNP in the last 8760 hours. HbA1C: No results for input(s): HGBA1C in the last 72 hours. CBG: Recent Labs  Lab 06/16/19 1351 06/16/19 1635 06/16/19 2120 06/17/19 0648 06/17/19 1139  GLUCAP 190* 188* 161* 153* 153*   Lipid Profile: No results for input(s): CHOL, HDL, LDLCALC, TRIG, CHOLHDL, LDLDIRECT in the last 72 hours. Thyroid Function Tests: No results for input(s): TSH, T4TOTAL, FREET4, T3FREE, THYROIDAB in the last 72 hours. Anemia Panel: No results for input(s): VITAMINB12, FOLATE, FERRITIN, TIBC, IRON, RETICCTPCT in the last 72 hours. Sepsis Labs: No results for input(s): PROCALCITON, LATICACIDVEN in the last 168 hours.  Recent  Results (from the past 240 hour(s))  Blood culture (routine x 2)     Status: Abnormal   Collection Time: 06/10/19  1:25 PM   Specimen: BLOOD  Result Value Ref Range Status   Specimen Description BLOOD LEFT ANTECUBITAL  Final   Special Requests   Final    BOTTLES DRAWN AEROBIC AND ANAEROBIC Blood Culture adequate volume   Culture  Setup Time   Final    GRAM POSITIVE COCCI IN CLUSTERS IN BOTH AEROBIC AND ANAEROBIC BOTTLES CRITICAL RESULT CALLED TO, READ BACK BY AND VERIFIED WITH: K. AMEND PHARMD, AT 7062 06/11/19 BY D. VANHOOK    Culture (A)  Final    STAPHYLOCOCCUS AUREUS SUSCEPTIBILITIES PERFORMED ON PREVIOUS CULTURE WITHIN THE LAST 5 DAYS. Performed at [facility], [place] [address] [place], [place] [address]    Report Status 06/13/2019 FINAL  Final  Blood culture (routine x 2)     Status: Abnormal   Collection Time: 06/10/19  1:35 PM   Specimen: BLOOD  Result Value Ref Range Status   Specimen Description BLOOD BLOOD RIGHT FOREARM  Final   Special Requests   Final    BOTTLES DRAWN AEROBIC AND ANAEROBIC Blood Culture adequate volume   Culture  Setup Time   Final    GRAM POSITIVE COCCI AEROBIC BOTTLE ONLY CRITICAL VALUE NOTED.  VALUE IS CONSISTENT WITH PREVIOUSLY REPORTED AND CALLED VALUE. Performed at [facility], [place] [address] [place], [place] [address]    Culture STAPHYLOCOCCUS AUREUS (A)  Final   Report Status 06/13/2019 FINAL  Final   Organism ID, Bacteria STAPHYLOCOCCUS AUREUS  Final      Susceptibility   Staphylococcus aureus - MIC*    CIPROFLOXACIN <=0.5 SENSITIVE Sensitive     ERYTHROMYCIN <=0.25 SENSITIVE Sensitive     GENTAMICIN <=0.5 SENSITIVE Sensitive     OXACILLIN 0.5 SENSITIVE Sensitive     TETRACYCLINE >=16 RESISTANT Resistant     VANCOMYCIN 1 SENSITIVE Sensitive     TRIMETH/SULFA <=10 SENSITIVE Sensitive     CLINDAMYCIN <=0.25 SENSITIVE Sensitive     RIFAMPIN <=0.5 SENSITIVE Sensitive     Inducible Clindamycin NEGATIVE Sensitive     *  STAPHYLOCOCCUS AUREUS  Blood Culture ID Panel (Reflexed)     Status: Abnormal   Collection Time: 06/10/19  1:35 PM  Result Value Ref Range Status  Enterococcus species NOT DETECTED NOT DETECTED Final   Listeria monocytogenes NOT DETECTED NOT DETECTED Final   Staphylococcus species DETECTED (A) NOT DETECTED Final    Comment: CRITICAL RESULT CALLED TO, READ BACK BY AND VERIFIED WITH: Andres Shad PharmD 14:55 06/11/19 ([email or web address])    Staphylococcus aureus (BCID) DETECTED (A) NOT DETECTED Final    Comment: Methicillin (oxacillin) susceptible Staphylococcus aureus (MSSA). Preferred therapy is anti staphylococcal beta lactam antibiotic (Cefazolin or Nafcillin), unless clinically contraindicated. CRITICAL RESULT CALLED TO, READ BACK BY AND VERIFIED WITH: Andres Shad PharmD 14:55 06/11/19 ([email or web address])    Methicillin resistance NOT DETECTED NOT DETECTED Final   Streptococcus species NOT DETECTED NOT DETECTED Final   Streptococcus agalactiae NOT DETECTED NOT DETECTED Final   Streptococcus pneumoniae NOT DETECTED NOT DETECTED Final   Streptococcus pyogenes NOT DETECTED NOT DETECTED Final   Acinetobacter baumannii NOT DETECTED NOT DETECTED Final   Enterobacteriaceae species NOT DETECTED NOT DETECTED Final   Enterobacter cloacae complex NOT DETECTED NOT DETECTED Final   Escherichia coli NOT DETECTED NOT DETECTED Final   Klebsiella oxytoca NOT DETECTED NOT DETECTED Final   Klebsiella pneumoniae NOT DETECTED NOT DETECTED Final   Proteus species NOT DETECTED NOT DETECTED Final   Serratia marcescens NOT DETECTED NOT DETECTED Final   Haemophilus influenzae NOT DETECTED NOT DETECTED Final   Neisseria meningitidis NOT DETECTED NOT DETECTED Final   Pseudomonas aeruginosa NOT DETECTED NOT DETECTED Final   Candida albicans NOT DETECTED NOT DETECTED Final   Candida glabrata NOT DETECTED NOT DETECTED Final   Candida krusei NOT DETECTED NOT DETECTED Final   Candida parapsilosis NOT DETECTED NOT DETECTED Final   Candida  tropicalis NOT DETECTED NOT DETECTED Final    Comment: Performed at [facility], [address], [place], [place] [address]  Urine culture     Status: Abnormal   Collection Time: 06/10/19  1:45 PM   Specimen: Urine, Clean Catch  Result Value Ref Range Status   Specimen Description URINE, CLEAN CATCH  Final   Special Requests   Final    URINE, RANDOM Performed at [facility], [place] [address] [place], [place] [address]    Culture >=100,000 COLONIES/mL STAPHYLOCOCCUS AUREUS (A)  Final   Report Status 06/12/2019 FINAL  Final   Organism ID, Bacteria STAPHYLOCOCCUS AUREUS (A)  Final      Susceptibility   Staphylococcus aureus - MIC*    CIPROFLOXACIN <=0.5 SENSITIVE Sensitive     GENTAMICIN <=0.5 SENSITIVE Sensitive     NITROFURANTOIN 32 SENSITIVE Sensitive     OXACILLIN 0.5 SENSITIVE Sensitive     TETRACYCLINE >=16 RESISTANT Resistant     VANCOMYCIN 1 SENSITIVE Sensitive     TRIMETH/SULFA <=10 SENSITIVE Sensitive     CLINDAMYCIN <=0.25 SENSITIVE Sensitive     RIFAMPIN <=0.5 SENSITIVE Sensitive     Inducible Clindamycin NEGATIVE Sensitive     * >=100,000 COLONIES/mL STAPHYLOCOCCUS AUREUS  SARS CORONAVIRUS 2 (TAT 6-24 HRS) Nasopharyngeal Nasopharyngeal Swab     Status: None   Collection Time: 06/10/19  6:40 PM   Specimen: Nasopharyngeal Swab  Result Value Ref Range Status   SARS Coronavirus 2 NEGATIVE NEGATIVE Final    Comment: (NOTE) SARS-CoV-2 target nucleic acids are NOT DETECTED. The SARS-CoV-2 RNA is generally detectable in upper and lower respiratory specimens during the acute phase of infection. Negative results do not preclude SARS-CoV-2 infection, do not rule out co-infections with other pathogens, and should not be used as the sole basis for treatment or other patient management  decisions. Negative results must be combined with clinical observations, patient history, and epidemiological information. The expected result is Negative. Fact Sheet for Patients:  SugarRoll.be Fact Sheet for Healthcare Providers: https://www.woods-mathews.com/ This test is not yet approved or cleared by the [place] FDA and  has been authorized for detection and/or diagnosis of SARS-CoV-2 by FDA under an Emergency Use Authorization (EUA). This EUA will remain  in effect (meaning this test can be used) for the duration of the COVID-19 declaration under Section 56 4(b)(1) of the Act, 21 U.S.C. section 360bbb-3(b)(1), unless the authorization is terminated or revoked sooner. Performed at [facility], [place] [address], [place], [place] [address]   Culture, blood (routine x 2)     Status: None   Collection Time: 06/11/19  4:35 PM   Specimen: BLOOD LEFT HAND  Result Value Ref Range Status   Specimen Description BLOOD LEFT HAND  Final   Special Requests   Final    BOTTLES DRAWN AEROBIC AND ANAEROBIC Blood Culture adequate volume   Culture   Final    NO GROWTH 5 DAYS Performed at [facility], [place] [address], [place], [place] [address]    Report Status 06/16/2019 FINAL  Final  Culture, blood (routine x 2)     Status: None   Collection Time: 06/11/19  4:40 PM   Specimen: BLOOD RIGHT HAND  Result Value Ref Range Status   Specimen Description BLOOD RIGHT HAND  Final   Special Requests   Final    BOTTLES DRAWN AEROBIC ONLY Blood Culture adequate volume   Culture   Final    NO GROWTH 5 DAYS Performed at [facility], [place] [address] [place], [place] [address]    Report Status 06/16/2019 FINAL  Final     Radiology Studies: Ir US Guide Bx Asp/drain  Result Date: 06/17/2019 INDICATION: Indeterminate left gluteal fluid collection worrisome for abscess. Please perform ultrasound-guided aspiration and/or drainage catheter placement for diagnostic and therapeutic purposes. EXAM: ULTRASOUND-GUIDED LEFT GLUTEAL ABSCESS DRAINAGE CATHETER PLACEMENT COMPARISON:  Left hip MRI-06/13/2019; left hip CT-06/10/2019  MEDICATIONS: The patient is currently admitted to the hospital and receiving intravenous antibiotics. The antibiotics were administered within an appropriate time frame prior to the initiation of the procedure. ANESTHESIA/SEDATION: Moderate (conscious) sedation was employed during this procedure. A total of Versed 2.5 mg and Fentanyl 125 mcg was administered intravenously. Moderate Sedation Time: 22 minutes. The patient's level of consciousness and vital signs were monitored continuously by radiology nursing throughout the procedure under my direct supervision. CONTRAST:  None COMPLICATIONS: None immediate. PROCEDURE: Informed written consent was obtained from the patient after a discussion of the risks, benefits and alternatives to treatment. The patient was placed slightly RPO on his hospital bed (patient was unable to tolerate positioning on the CT table or prone positioning) with preprocedural imaging demonstrating a complex at least 7.7 x 7.3 cm fluid collection within the deep tissues of the right buttocks (image 2). The procedure was planned. A timeout was performed prior to the initiation of the procedure. The posterolateral aspect of the left buttocks was prepped and draped in the usual sterile fashion. The overlying soft tissues were anesthetized with 1% lidocaine with epinephrine. Under direct ultrasound guidance, the fluid collection was accessed with an 18 gauge trocar needle and a short Amplatz wire was coiled within the collection. Multiple ultrasound images were saved for procedural documentation purposes Next, the track was dilated allowing placement of a 10 [place] all-purpose drainage catheter. Following  drainage catheter placement, approximately 400 cc of pink colored purulent appearing fluid was aspirated. A sample of aspirated fluid was capped and sent to the laboratory for analysis. The drainage catheter was connected to a JP bulb and secured in place within interrupted suture and a Stat Lock  device. A dressing was applied. The patient tolerated the procedure well without immediate postprocedural complication. IMPRESSION: Successful ultrasound guided placement of a 10 [place] all purpose drain catheter into the left gluteal abscess with aspiration of 400 cc of pink colored, purulent appearing fluid. A representative aspirated sample was capped and sent to the laboratory for analysis. Electronically Signed   By: Sandi Mariscal M.D.   On: 06/17/2019 11:13    Scheduled Meds: . bisacodyl  10 mg Rectal Once  . cholecalciferol  2,000 Units Oral Daily  . fentaNYL      . fentaNYL      . heparin  5,000 Units Subcutaneous Q8H  . insulin aspart  0-15 Units Subcutaneous TID WC  . insulin aspart  0-5 Units Subcutaneous QHS  . insulin aspart  8 Units Subcutaneous TID WC  . insulin glargine  25 Units Subcutaneous Daily  . lidocaine      . midazolam      . midazolam      . multivitamin with minerals  1 tablet Oral Daily  . Ensure Max Protein  11 oz Oral Daily  . sodium chloride flush  5 mL Intracatheter Q8H  . vitamin C  1,000 mg Oral Daily   Continuous Infusions: . sodium chloride 500 mL (06/17/19 0545)  .  ceFAZolin (ANCEF) IV 2 g (06/17/19 1333)     LOS: 7 days   Marylu Lund, MD [facility] Pager On Amion  If 7PM-7AM, please contact night-coverage 06/17/2019, 2:07 PM

## 2019-06-17 NOTE — Progress Notes (Signed)
Pharmacy Post-IR procedure consult for anticoagulation.   Standard bleeding risk. Patient on heparin SQ for VTE prophylaxis prior to procedure. Will restart sq heparin at 2200 tonight (>6 hours after procedure per protocol.   Lusia Greis A. Levada Dy, PharmD, BCPS, FNKF Clinical Pharmacist [place] Please utilize Amion for appropriate phone number to reach the unit pharmacist ([place])

## 2019-06-18 DIAGNOSIS — E0865 Diabetes mellitus due to underlying condition with hyperglycemia: Secondary | ICD-10-CM

## 2019-06-18 DIAGNOSIS — R6520 Severe sepsis without septic shock: Secondary | ICD-10-CM

## 2019-06-18 DIAGNOSIS — N179 Acute kidney failure, unspecified: Secondary | ICD-10-CM

## 2019-06-18 LAB — GLUCOSE, CAPILLARY
Glucose-Capillary: 145 mg/dL — ABNORMAL HIGH (ref 70–99)
Glucose-Capillary: 150 mg/dL — ABNORMAL HIGH (ref 70–99)
Glucose-Capillary: 195 mg/dL — ABNORMAL HIGH (ref 70–99)
Glucose-Capillary: 142 mg/dL — ABNORMAL HIGH (ref 70–99)

## 2019-06-18 LAB — CBC
HCT: 36.7 % — ABNORMAL LOW (ref 39.0–52.0)
Hemoglobin: 12.2 g/dL — ABNORMAL LOW (ref 13.0–17.0)
MCH: 30.7 pg (ref 26.0–34.0)
MCHC: 33.2 g/dL (ref 30.0–36.0)
MCV: 92.2 fL (ref 80.0–100.0)
Platelets: 136 10*3/uL — ABNORMAL LOW (ref 150–400)
RBC: 3.98 MIL/uL — ABNORMAL LOW (ref 4.22–5.81)
RDW: 14.7 % (ref 11.5–15.5)
WBC: 10.8 10*3/uL — ABNORMAL HIGH (ref 4.0–10.5)
nRBC: 0 % (ref 0.0–0.2)

## 2019-06-18 LAB — COMPREHENSIVE METABOLIC PANEL
ALT: 27 U/L (ref 0–44)
AST: 82 U/L — ABNORMAL HIGH (ref 15–41)
Albumin: 1.3 g/dL — ABNORMAL LOW (ref 3.5–5.0)
Alkaline Phosphatase: 300 U/L — ABNORMAL HIGH (ref 38–126)
Anion gap: 10 (ref 5–15)
BUN: 15 mg/dL (ref 6–20)
CO2: 24 mmol/L (ref 22–32)
Calcium: 8 mg/dL — ABNORMAL LOW (ref 8.9–10.3)
Chloride: 101 mmol/L (ref 98–111)
Creatinine, Ser: 0.55 mg/dL — ABNORMAL LOW (ref 0.61–1.24)
GFR calc Af Amer: >60 mL/min (ref >60)
GFR calc non Af Amer: >60 mL/min (ref >60)
Glucose, Bld: 142 mg/dL — ABNORMAL HIGH (ref 70–99)
Potassium: 3.9 mmol/L (ref 3.5–5.1)
Sodium: 135 mmol/L (ref 135–145)
Total Bilirubin: 1.3 mg/dL — ABNORMAL HIGH (ref 0.3–1.2)
Total Protein: 6.6 g/dL (ref 6.5–8.1)

## 2019-06-18 MED ORDER — LORATADINE 10 MG PO TABS
10.0000 mg | ORAL_TABLET | Freq: Every day | ORAL | Status: DC
  Administered 2019-06-18 – 2019-06-29 (×12): 10 mg via ORAL
  Filled 2019-06-18 (×12): qty 1

## 2019-06-18 NOTE — Plan of Care (Signed)
  Problem: Activity: Goal: Risk for activity intolerance will decrease Outcome: Not Progressing   

## 2019-06-18 NOTE — Progress Notes (Addendum)
PROGRESS NOTE    Robert Sullivan  ERD:[id number] DOB: 1966-02-27 DOA: 06/10/2019 PCP: Cathleen Corti, PA-C    Brief Narrative:  53 y.o. male with history of uncontrolled type 2 diabetes mellitus, morbid obesity was admitted to [facility] from 11/2-11/7 with E. coli UTI, uncontrolled diabetes and a fall that resulted in a left gluteal hematoma, he was treated with IV antibiotics, discharged home on Bactrim, completed course and also on insulin. -He felt well initially after discharge, in the last 7 to 10 days he started having fevers and chills, profuse sweats, 5 to 6 days ago he started having increased pain and swelling at the site of his gluteal hematoma, he also noticed that his urine was darker but denied any dysuria or change in odor presented to the emergency room today with weakness, worsening left hip pain, fevers chills and sweats. -In the ED he was noted to have a sodium of 131, CBG of 380, albumin of 1.8, ALP of 224,  lactic acid of 5.5, white count of 20,000.  In the ED he was noted to have an elevated D-dimer subsequently EDP proceeded with CT angiogram which was negative for pulmonary embolism, his UA was significantly abnormal with cloudy urine, positive nitrite, leukocyte esterase, numerous WBC and bacteria.  In addition noted to have redness and tenderness of his left gluteal region  Assessment & Plan:   Active Problems:   Acute lower UTI   Sepsis ([place])   Uncontrolled diabetes mellitus (HCC)   Obesity, Class III, BMI 40-49.9 (morbid obesity) (HCC)   Cellulitis, gluteal, left  Sepsis secondary to MSSA bacteremia -Blood cx pos for MSSA, ID now consulted with recommendation for 2d echo to r/o endocarditis, neg findings -TEE performed 12/7 with no evidence of vegetations -currently on ancef per ID -CT reviewed, no drainable abscess identified in L hip. 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 Pt better able to ambulate -Appreciate Orthopedics input.  -Pt now s/p drain placement by IR 12/7.  Pressure bulb changed 3 times within 12 hours.  Drainage appears to be slowing down, although if the abscess was indeed 22 cm, there is probably continued loculated fluid. -WBC is improved to 10.8 this morning.  Repeat labs tomorrow -Does have petechial rash. We will start the patient on antihistamine.  Discussed with ID - they aren't convinced that this is a drug reaction. Will watch closely.   -Currently cultures are growing out staph aureus  Morbid obesity -BMI of 47 -diet/lifestyle modification recommended  Uncontrolled type 2 diabetes mellitus -Hemoglobin A1c was 11 recently -Resume Lantus and NovoLog premeal and sliding scale insulin -Glucose trends stable  Suspected cirrhosis -Noted on imaging, patient denies any alcohol. Suspect NASH/fatty liver disease is likely the primary culprit -Hep C neg -GI follow up recommended on d/c  DVT prophylaxis: Heparin subQ Code Status: Full Family Communication: Pt in room, family not at bedside Disposition Plan: Uncertain at this time  Consultants:   ID  Cardiology  Orthopedic Surgery  IR  Procedures:   TEE 12/7  IR guided drain placement to L thigh 12/8  Antimicrobials: Anti-infectives (From admission, onward)   Start     Dose/Rate Route Frequency Ordered Stop   06/11/19 2200  ceFAZolin (ANCEF) IVPB 2g/100 mL premix     2 g 200  mL/hr over 30 Minutes Intravenous Every 8 hours 06/11/19 1456     06/11/19 0200  vancomycin (VANCOCIN) 1,750 mg in sodium chloride 0.9 % 500 mL IVPB  Status:  Discontinued     1,750 mg 250 mL/hr over 120 Minutes Intravenous Every 12 hours 06/10/19 1257 06/11/19 1456   06/10/19 2200  ceFEPIme (MAXIPIME) 2 g in sodium chloride 0.9 % 100 mL IVPB  Status:  Discontinued     2 g 200 mL/hr over 30 Minutes Intravenous Every 8 hours 06/10/19 1257 06/10/19 1740   06/10/19 1745  cefTRIAXone (ROCEPHIN) 1 g in sodium chloride 0.9 % 100 mL IVPB  Status:  Discontinued     1 g 200 mL/hr over 30 Minutes Intravenous Every 24 hours 06/10/19 1740 06/11/19 1456   06/10/19 1245  ceFEPIme (MAXIPIME) 2 g in sodium chloride 0.9 % 100 mL IVPB     2 g 200 mL/hr over 30 Minutes Intravenous  Once 06/10/19 1239 06/10/19 1411   06/10/19 1245  vancomycin (VANCOCIN) IVPB 1000 mg/200 mL premix  Status:  Discontinued     1,000 mg 200 mL/hr over 60 Minutes Intravenous  Once 06/10/19 1239 06/10/19 1241   06/10/19 1245  vancomycin (VANCOCIN) 2,500 mg in sodium chloride 0.9 % 500 mL IVPB     2,500 mg 250 mL/hr over 120 Minutes Intravenous  Once 06/10/19 1241 06/10/19 1635      Subjective: Pain under control.  Feels that is able to move with decreased pain.  Appetite slightly improving.  Has been out of bed minimally since procedure yesterday  Objective: Vitals:   06/17/19 1628 06/17/19 2034 06/18/19 0515 06/18/19 0900  BP: 123/61 124/72 117/67 140/70  Pulse: 91 96 84 91  Resp: 18 16 18 18   Temp: 98.2 F (36.8 C) 98.3 F (36.8 C) 97.8 F (36.6 C) 98.2 F (36.8 C)  TempSrc: Oral Oral Oral Oral  SpO2: 96% 95% 96% 94%  Weight:  (!) 170.3 kg    Height:        Intake/Output Summary (Last 24 hours) at 06/18/2019 1149 Last data filed at 06/18/2019 0900 Gross per 24 hour  Intake 750 ml  Output 1310 ml  Net -560 ml   Filed Weights   06/16/19 0430 06/16/19 1117 06/17/19 2034  Weight: (!) 170.2 kg (!) 170.2 kg (!)  170.3 kg    Examination: General exam: Awake, alert, no acute distress. Respiratory system: Good respiratory effort with no wheezes, rales, rhonchi Cardiovascular system: Regular rate.  Normal S1-S2 sounds. Gastrointestinal system: Soft, nontender, nondistended. Central nervous system: CN2-12 grossly intact, strength intact Extremities: Minimal edema.  Pulses normal. Skin: Drain in left buttock.  Skin around drain normal without erythema.  Petechial rash in right upper arm, as well as in ankles bilaterally. Psychiatry: Mood normal // no visual hallucinations   Data Reviewed: I have personally reviewed following labs and imaging studies  CBC: Recent Labs  Lab 06/14/19 0442 06/15/19 0545 06/16/19 0512 06/17/19 0611 06/18/19 0211  WBC 19.8* 14.8* 14.2* 13.2* 10.8*  HGB 12.0* 11.4* 11.2* 12.3* 12.2*  HCT 35.0* 33.3* 32.8* 36.0* 36.7*  MCV 89.3 89.3 89.1 90.7 92.2  PLT 148* 123* 123* 135* 136*  Basic Metabolic Panel: Recent Labs  Lab 06/14/19 0442 06/15/19 0545 06/16/19 0512 06/17/19 0611 06/18/19 0211  NA 132* 129* 131* 134* 135  K 4.6 4.4 4.0 4.0 3.9  CL 101 100 100 101 101  CO2 22 22 23 22 24   GLUCOSE 186* 195* 209* 173* 142*  BUN 20 17 18 18 15   CREATININE 0.76 0.62 0.72 0.59* 0.55*  CALCIUM 7.9* 7.8* 7.9* 7.9* 8.0*   GFR: Estimated Creatinine Clearance: 181.5 mL/min (A) (by C-G formula based on SCr of 0.55 mg/dL (L)). Liver Function Tests: Recent Labs  Lab 06/14/19 0442 06/15/19 0545 06/16/19 0512 06/17/19 0611 06/18/19 0211  AST 88* 82* 81* 80* 82*  ALT 32 29 28 27 27   ALKPHOS 282* 292* 299* 300* 300*  BILITOT 1.5* 1.4* 1.2 1.4* 1.3*  PROT 6.2* 5.7* 5.9* 6.4* 6.6  ALBUMIN 1.3* 1.2* 1.2* 1.3* 1.3*   No results for input(s): LIPASE, AMYLASE in the last 168 hours. No results for input(s): AMMONIA in the last 168 hours. Coagulation Profile: Recent Labs  Lab 06/16/19 1656  INR 1.4*   Cardiac Enzymes: Recent Labs  Lab 06/12/19 0318  CKTOTAL 15*    BNP (last 3 results) No results for input(s): PROBNP in the last 8760 hours. HbA1C: No results for input(s): HGBA1C in the last 72 hours. CBG: Recent Labs  Lab 06/17/19 1139 06/17/19 1629 06/17/19 2033 06/18/19 0653 06/18/19 1116  GLUCAP 153* 145* 162* 145* 150*   Lipid Profile: No results for input(s): CHOL, HDL, LDLCALC, TRIG, CHOLHDL, LDLDIRECT in the last 72 hours. Thyroid Function Tests: No results for input(s): TSH, T4TOTAL, FREET4, T3FREE, THYROIDAB in the last 72 hours. Anemia Panel: No results for input(s): VITAMINB12, FOLATE, FERRITIN, TIBC, IRON, RETICCTPCT in the last 72 hours. Sepsis Labs: No results for input(s): PROCALCITON, LATICACIDVEN in the last 168 hours.  Recent Results (from the past 240 hour(s))  Blood culture (routine x 2)     Status: Abnormal   Collection Time: 06/10/19  1:25 PM   Specimen: BLOOD  Result Value Ref Range Status   Specimen Description BLOOD LEFT ANTECUBITAL  Final   Special Requests   Final    BOTTLES DRAWN AEROBIC AND ANAEROBIC Blood Culture adequate volume   Culture  Setup Time   Final    GRAM POSITIVE COCCI IN CLUSTERS IN BOTH AEROBIC AND ANAEROBIC BOTTLES CRITICAL RESULT CALLED TO, READ BACK BY AND VERIFIED WITH: K. AMEND PHARMD, AT 5102 06/11/19 BY D. VANHOOK    Culture (A)  Final    STAPHYLOCOCCUS AUREUS SUSCEPTIBILITIES PERFORMED ON PREVIOUS CULTURE WITHIN THE LAST 5 DAYS. Performed at [facility], [place] [address] [place], [place] [address]    Report Status 06/13/2019 FINAL  Final  Blood culture (routine x 2)     Status: Abnormal   Collection Time: 06/10/19  1:35 PM   Specimen: BLOOD  Result Value Ref Range Status   Specimen Description BLOOD BLOOD RIGHT FOREARM  Final   Special Requests   Final    BOTTLES DRAWN AEROBIC AND ANAEROBIC Blood Culture adequate volume   Culture  Setup Time   Final    GRAM POSITIVE COCCI AEROBIC BOTTLE ONLY CRITICAL VALUE NOTED.  VALUE IS CONSISTENT WITH PREVIOUSLY REPORTED AND  CALLED VALUE. Performed at [facility], [place] [address] [place], [place] [address]    Culture STAPHYLOCOCCUS AUREUS (A)  Final   Report Status 06/13/2019 FINAL  Final   Organism ID, Bacteria STAPHYLOCOCCUS AUREUS  Final  Susceptibility   Staphylococcus aureus - MIC*    CIPROFLOXACIN <=0.5 SENSITIVE Sensitive     ERYTHROMYCIN <=0.25 SENSITIVE Sensitive     GENTAMICIN <=0.5 SENSITIVE Sensitive     OXACILLIN 0.5 SENSITIVE Sensitive     TETRACYCLINE >=16 RESISTANT Resistant     VANCOMYCIN 1 SENSITIVE Sensitive     TRIMETH/SULFA <=10 SENSITIVE Sensitive     CLINDAMYCIN <=0.25 SENSITIVE Sensitive     RIFAMPIN <=0.5 SENSITIVE Sensitive     Inducible Clindamycin NEGATIVE Sensitive     * STAPHYLOCOCCUS AUREUS  Blood Culture ID Panel (Reflexed)     Status: Abnormal   Collection Time: 06/10/19  1:35 PM  Result Value Ref Range Status   Enterococcus species NOT DETECTED NOT DETECTED Final   Listeria monocytogenes NOT DETECTED NOT DETECTED Final   Staphylococcus species DETECTED (A) NOT DETECTED Final    Comment: CRITICAL RESULT CALLED TO, READ BACK BY AND VERIFIED WITH: Andres Shad PharmD 14:55 06/11/19 ([email or web address])    Staphylococcus aureus (BCID) DETECTED (A) NOT DETECTED Final    Comment: Methicillin (oxacillin) susceptible Staphylococcus aureus (MSSA). Preferred therapy is anti staphylococcal beta lactam antibiotic (Cefazolin or Nafcillin), unless clinically contraindicated. CRITICAL RESULT CALLED TO, READ BACK BY AND VERIFIED WITH: Andres Shad PharmD 14:55 06/11/19 ([email or web address])    Methicillin resistance NOT DETECTED NOT DETECTED Final   Streptococcus species NOT DETECTED NOT DETECTED Final   Streptococcus agalactiae NOT DETECTED NOT DETECTED Final   Streptococcus pneumoniae NOT DETECTED NOT DETECTED Final   Streptococcus pyogenes NOT DETECTED NOT DETECTED Final   Acinetobacter baumannii NOT DETECTED NOT DETECTED Final   Enterobacteriaceae species NOT DETECTED NOT DETECTED Final    Enterobacter cloacae complex NOT DETECTED NOT DETECTED Final   Escherichia coli NOT DETECTED NOT DETECTED Final   Klebsiella oxytoca NOT DETECTED NOT DETECTED Final   Klebsiella pneumoniae NOT DETECTED NOT DETECTED Final   Proteus species NOT DETECTED NOT DETECTED Final   Serratia marcescens NOT DETECTED NOT DETECTED Final   Haemophilus influenzae NOT DETECTED NOT DETECTED Final   Neisseria meningitidis NOT DETECTED NOT DETECTED Final   Pseudomonas aeruginosa NOT DETECTED NOT DETECTED Final   Candida albicans NOT DETECTED NOT DETECTED Final   Candida glabrata NOT DETECTED NOT DETECTED Final   Candida krusei NOT DETECTED NOT DETECTED Final   Candida parapsilosis NOT DETECTED NOT DETECTED Final   Candida tropicalis NOT DETECTED NOT DETECTED Final    Comment: Performed at [facility], [address], [place], [place] [address]  Urine culture     Status: Abnormal   Collection Time: 06/10/19  1:45 PM   Specimen: Urine, Clean Catch  Result Value Ref Range Status   Specimen Description URINE, CLEAN CATCH  Final   Special Requests   Final    URINE, RANDOM Performed at [facility], [place] [address] [place], [place] [address]    Culture >=100,000 COLONIES/mL STAPHYLOCOCCUS AUREUS (A)  Final   Report Status 06/12/2019 FINAL  Final   Organism ID, Bacteria STAPHYLOCOCCUS AUREUS (A)  Final      Susceptibility   Staphylococcus aureus - MIC*    CIPROFLOXACIN <=0.5 SENSITIVE Sensitive     GENTAMICIN <=0.5 SENSITIVE Sensitive     NITROFURANTOIN 32 SENSITIVE Sensitive     OXACILLIN 0.5 SENSITIVE Sensitive     TETRACYCLINE >=16 RESISTANT Resistant     VANCOMYCIN 1 SENSITIVE Sensitive     TRIMETH/SULFA <=10 SENSITIVE Sensitive     CLINDAMYCIN <=0.25 SENSITIVE Sensitive     RIFAMPIN <=0.5 SENSITIVE  Sensitive     Inducible Clindamycin NEGATIVE Sensitive     * >=100,000 COLONIES/mL STAPHYLOCOCCUS AUREUS  SARS CORONAVIRUS 2 (TAT 6-24 HRS) Nasopharyngeal Nasopharyngeal Swab     Status:  None   Collection Time: 06/10/19  6:40 PM   Specimen: Nasopharyngeal Swab  Result Value Ref Range Status   SARS Coronavirus 2 NEGATIVE NEGATIVE Final    Comment: (NOTE) SARS-CoV-2 target nucleic acids are NOT DETECTED. The SARS-CoV-2 RNA is generally detectable in upper and lower respiratory specimens during the acute phase of infection. Negative results do not preclude SARS-CoV-2 infection, do not rule out co-infections with other pathogens, and should not be used as the sole basis for treatment or other patient management decisions. Negative results must be combined with clinical observations, patient history, and epidemiological information. The expected result is Negative. Fact Sheet for Patients: SugarRoll.be Fact Sheet for Healthcare Providers: https://www.woods-mathews.com/ This test is not yet approved or cleared by the [place] FDA and  has been authorized for detection and/or diagnosis of SARS-CoV-2 by FDA under an Emergency Use Authorization (EUA). This EUA will remain  in effect (meaning this test can be used) for the duration of the COVID-19 declaration under Section 56 4(b)(1) of the Act, 21 U.S.C. section 360bbb-3(b)(1), unless the authorization is terminated or revoked sooner. Performed at [facility], [place] [address], [place], [place] [address]   Culture, blood (routine x 2)     Status: None   Collection Time: 06/11/19  4:35 PM   Specimen: BLOOD LEFT HAND  Result Value Ref Range Status   Specimen Description BLOOD LEFT HAND  Final   Special Requests   Final    BOTTLES DRAWN AEROBIC AND ANAEROBIC Blood Culture adequate volume   Culture   Final    NO GROWTH 5 DAYS Performed at [facility], [place] [address], [place], [place] [address]    Report Status 06/16/2019 FINAL  Final  Culture, blood (routine x 2)     Status: None   Collection Time: 06/11/19  4:40 PM   Specimen: BLOOD RIGHT HAND  Result Value Ref  Range Status   Specimen Description BLOOD RIGHT HAND  Final   Special Requests   Final    BOTTLES DRAWN AEROBIC ONLY Blood Culture adequate volume   Culture   Final    NO GROWTH 5 DAYS Performed at [facility], [place] [address], [place], [place] [address]    Report Status 06/16/2019 FINAL  Final  Aerobic/Anaerobic Culture (surgical/deep wound)     Status: None (Preliminary result)   Collection Time: 06/17/19 11:27 AM   Specimen: Abscess  Result Value Ref Range Status   Specimen Description ABSCESS LEFT GLUTEAL  Final   Special Requests Normal  Final   Gram Stain   Final    ABUNDANT WBC PRESENT,BOTH PMN AND MONONUCLEAR ABUNDANT GRAM POSITIVE COCCI    Culture   Final    MODERATE STAPHYLOCOCCUS AUREUS SUSCEPTIBILITIES TO FOLLOW Performed at [facility], [place] [address], [place],  [address]    Report Status PENDING  Incomplete     Radiology Studies: Ir US Guide Bx Asp/drain  Result Date: 06/17/2019 INDICATION: Indeterminate left gluteal fluid collection worrisome for abscess. Please perform ultrasound-guided aspiration and/or drainage catheter placement for diagnostic and therapeutic purposes. EXAM: ULTRASOUND-GUIDED LEFT GLUTEAL ABSCESS DRAINAGE CATHETER PLACEMENT COMPARISON:  Left hip MRI-06/13/2019; left hip CT-06/10/2019 MEDICATIONS: The patient is currently admitted to the hospital and receiving intravenous antibiotics. The antibiotics were administered  within an appropriate time frame prior to the initiation of the procedure. ANESTHESIA/SEDATION: Moderate (conscious) sedation was employed during this procedure. A total of Versed 2.5 mg and Fentanyl 125 mcg was administered intravenously. Moderate Sedation Time: 22 minutes. The patient's level of consciousness and vital signs were monitored continuously by radiology nursing throughout the procedure under my direct supervision. CONTRAST:  None COMPLICATIONS: None immediate. PROCEDURE: Informed written consent was  obtained from the patient after a discussion of the risks, benefits and alternatives to treatment. The patient was placed slightly RPO on his hospital bed (patient was unable to tolerate positioning on the CT table or prone positioning) with preprocedural imaging demonstrating a complex at least 7.7 x 7.3 cm fluid collection within the deep tissues of the right buttocks (image 2). The procedure was planned. A timeout was performed prior to the initiation of the procedure. The posterolateral aspect of the left buttocks was prepped and draped in the usual sterile fashion. The overlying soft tissues were anesthetized with 1% lidocaine with epinephrine. Under direct ultrasound guidance, the fluid collection was accessed with an 18 gauge trocar needle and a short Amplatz wire was coiled within the collection. Multiple ultrasound images were saved for procedural documentation purposes Next, the track was dilated allowing placement of a 10 [place] all-purpose drainage catheter. Following drainage catheter placement, approximately 400 cc of pink colored purulent appearing fluid was aspirated. A sample of aspirated fluid was capped and sent to the laboratory for analysis. The drainage catheter was connected to a JP bulb and secured in place within interrupted suture and a Stat Lock device. A dressing was applied. The patient tolerated the procedure well without immediate postprocedural complication. IMPRESSION: Successful ultrasound guided placement of a 10 [place] all purpose drain catheter into the left gluteal abscess with aspiration of 400 cc of pink colored, purulent appearing fluid. A representative aspirated sample was capped and sent to the laboratory for analysis. Electronically Signed   By: Sandi Mariscal M.D.   On: 06/17/2019 11:13    Scheduled Meds: . bisacodyl  10 mg Rectal Once  . cholecalciferol  2,000 Units Oral Daily  . heparin  5,000 Units Subcutaneous Q8H  . insulin aspart  0-15 Units Subcutaneous TID WC   . insulin aspart  0-5 Units Subcutaneous QHS  . insulin aspart  8 Units Subcutaneous TID WC  . insulin glargine  25 Units Subcutaneous Daily  . loratadine  10 mg Oral Daily  . multivitamin with minerals  1 tablet Oral Daily  . Ensure Max Protein  11 oz Oral Daily  . sodium chloride flush  5 mL Intracatheter Q8H  . vitamin C  1,000 mg Oral Daily   Continuous Infusions: . sodium chloride 500 mL (06/17/19 0545)  .  ceFAZolin (ANCEF) IV 2 g (06/18/19 0509)     LOS: 8 days   Truett Mainland, DO [facility] Pager On Amion  If 7PM-7AM, please contact night-coverage 06/18/2019, 11:49 AM

## 2019-06-18 NOTE — Plan of Care (Signed)
  Problem: Education: Goal: Knowledge of General Education information will improve Description Including pain rating scale, medication(s)/side effects and non-pharmacologic comfort measures Outcome: Progressing   

## 2019-06-18 NOTE — Evaluation (Signed)
Physical Therapy Evaluation Patient Details Name: Robert Sullivan MRN: [id number] DOB: [birth year] Today's Date: 06/18/2019   History of Present Illness  Pt is a 53 y/o M admitted on 06/10/19 for fevers & chills & increased swelling at hematoma with pt found to have L gluteal abscess & MSSA bacetermia.  P t previously admitted to [facility] on 11/2-11/7 for E. Coli UTI, uncontrolled diabetes, & fall that resulted in a L gluteal hematoma, was treated with IV antibiotics & d/c home. Pt underwent TEE on 06/16/19 that showed no evidence of endocarditis. PMH significant for morbid obesity, uncontrolled DM2, & arthritis in B knees,    Clinical Impression  Pt is currently able to perform bed mobility with supervision & hospital bed features with significantly extra time but is unable to transfer to standing 2/2 LLE pain (pt with pain at rest that increases with movement) & decreased ability to place LLE underneath BOS for full weight bearing. Pt with difficult home set up (steps without rails to enter, full flight of stairs to access bed/bath on 2nd level) & only has teenage kids that can assist him during the day while his wife works. Discussed d/c plans with pt who understands current need for SNF level of care unless he can progress with functional mobility prior to d/c. Will continue to follow acutely to focus on transfers & gait (pt & nurse report pt was able to transfer to [facility] with little assistance prior to drain placement) to increase independence and assist with d/c planning.    Follow Up Recommendations SNF;Supervision for mobility/OOB    Equipment Recommendations  (TBD, pt already has RW & crutches)    Recommendations for Other Services       Precautions / Restrictions Precautions Precautions: Fall Precaution Comments: drain at L gluteal abscess Restrictions Weight Bearing Restrictions: No      Mobility  Bed Mobility Overal bed mobility: Needs Assistance Bed Mobility: Supine to Sit;Sit to  Supine     Supine to sit: Supervision;HOB elevated Sit to supine: Supervision;HOB elevated   General bed mobility comments: used bed rails, significantly extra time for supine<>sit, pt requires cuing to scoot to EOB and get BLE feet flat on floor, pt limited by pain in L hip  Transfers                 General transfer comment: pt attempted sit>stand from elevated EOB but unable 2/2 decreased ability to weight bear through LLE  Ambulation/Gait                Stairs            Wheelchair Mobility    Modified Rankin (Stroke Patients Only)       Balance Overall balance assessment: Needs assistance Sitting-balance support: Feet supported;Single extremity supported Sitting balance-Leahy Scale: Fair Sitting balance - Comments: supervision for static sitting balance EOB                                     Pertinent Vitals/Pain Pain Assessment: 0-10 Pain Score: 7  Pain Location: L hip Pain Descriptors / Indicators: Sore;Aching Pain Intervention(s): Monitored during session;Limited activity within patient's tolerance    Home Living Family/patient expects to be discharged to:: Private residence Living Arrangements: Spouse/significant other;Children(3 teenage children (ages 11-19 y/o)) Available Help at Discharge: Family;Available 24 hours/day(wife works 10-7, pt's teenage children at home during the day) Type of Home: House Home Access: Stairs  to enter Entrance Stairs-Rails: None Entrance Stairs-Number of Steps: 4 Home Layout: Two level;Bed/bath upstairs Home Equipment: Walker - 2 wheels;Crutches      Prior Function Level of Independence: Independent with assistive device(s)         Comments: pt reports he was using crutches to negotiate stairs at home, RW in the house on level surfaces     Hand Dominance        Extremity/Trunk Assessment   Upper Extremity Assessment Upper Extremity Assessment: Overall WFL for tasks assessed     Lower Extremity Assessment Lower Extremity Assessment: Generalized weakness LLE Deficits / Details: pain & weakness in LLE       Communication   Communication: No difficulties  Cognition Arousal/Alertness: Awake/alert Behavior During Therapy: WFL for tasks assessed/performed Overall Cognitive Status: Within Functional Limits for tasks assessed                                        General Comments      Exercises     Assessment/Plan    PT Assessment Patient needs continued PT services  PT Problem List Decreased strength;Decreased balance;Pain;Obesity;Cardiopulmonary status limiting activity;Decreased knowledge of use of DME;Decreased mobility;Decreased range of motion;Decreased activity tolerance;Decreased coordination;Decreased safety awareness;Impaired sensation;Decreased skin integrity       PT Treatment Interventions DME instruction;Gait training;Stair training;Functional mobility training;Therapeutic activities;Therapeutic exercise;Balance training;Neuromuscular re-education;Patient/family education    PT Goals (Current goals can be found in the Care Plan section)  Acute Rehab PT Goals Patient Stated Goal: less pain PT Goal Formulation: With patient Time For Goal Achievement: 06/25/19 Potential to Achieve Goals: Fair    Frequency Min 2X/week   Barriers to discharge Inaccessible home environment;Decreased caregiver support steps without rails to enter home & bed/bath on 2nd level, wife works during the day & only has teenage kids at home to provide supervision    Co-evaluation               AM-PAC PT "6 Clicks" Mobility  Outcome Measure Help needed turning from your back to your side while in a flat bed without using bedrails?: A Little Help needed moving from lying on your back to sitting on the side of a flat bed without using bedrails?: A Little Help needed moving to and from a bed to a chair (including a wheelchair)?: Total Help  needed standing up from a chair using your arms (e.g., wheelchair or bedside chair)?: Total Help needed to walk in hospital room?: Total Help needed climbing 3-5 steps with a railing? : Total 6 Click Score: 10    End of Session   Activity Tolerance: Patient limited by pain Patient left: in bed;with bed alarm set;with call bell/phone within reach Nurse Communication: Mobility status PT Visit Diagnosis: Other abnormalities of gait and mobility (R26.89);Muscle weakness (generalized) (M62.81);Difficulty in walking, not elsewhere classified (R26.2) Pain - Right/Left: Left Pain - part of body: Hip    Time: 2633-3545 PT Time Calculation (min) (ACUTE ONLY): 26 min   Charges:   PT Evaluation $PT Eval Moderate Complexity: 1 Mod PT Treatments $Therapeutic Activity: 8-22 mins           Waunita Schooner, PT, DPT 06/18/2019, 3:52 PM

## 2019-06-19 ENCOUNTER — Inpatient Hospital Stay (HOSPITAL_COMMUNITY): Payer: Self-pay

## 2019-06-19 ENCOUNTER — Inpatient Hospital Stay: Payer: Self-pay

## 2019-06-19 DIAGNOSIS — R7881 Bacteremia: Secondary | ICD-10-CM

## 2019-06-19 LAB — CBC
HCT: 34.4 % — ABNORMAL LOW (ref 39.0–52.0)
Hemoglobin: 11.4 g/dL — ABNORMAL LOW (ref 13.0–17.0)
MCH: 31 pg (ref 26.0–34.0)
MCHC: 33.1 g/dL (ref 30.0–36.0)
MCV: 93.5 fL (ref 80.0–100.0)
Platelets: 143 10*3/uL — ABNORMAL LOW (ref 150–400)
RBC: 3.68 MIL/uL — ABNORMAL LOW (ref 4.22–5.81)
RDW: 15 % (ref 11.5–15.5)
WBC: 10.2 10*3/uL (ref 4.0–10.5)
nRBC: 0 % (ref 0.0–0.2)

## 2019-06-19 LAB — BASIC METABOLIC PANEL
Anion gap: 8 (ref 5–15)
BUN: 14 mg/dL (ref 6–20)
CO2: 24 mmol/L (ref 22–32)
Calcium: 7.9 mg/dL — ABNORMAL LOW (ref 8.9–10.3)
Chloride: 104 mmol/L (ref 98–111)
Creatinine, Ser: 0.49 mg/dL — ABNORMAL LOW (ref 0.61–1.24)
GFR calc Af Amer: >60 mL/min (ref >60)
GFR calc non Af Amer: >60 mL/min (ref >60)
Glucose, Bld: 133 mg/dL — ABNORMAL HIGH (ref 70–99)
Potassium: 3.9 mmol/L (ref 3.5–5.1)
Sodium: 136 mmol/L (ref 135–145)

## 2019-06-19 LAB — GLUCOSE, CAPILLARY
Glucose-Capillary: 123 mg/dL — ABNORMAL HIGH (ref 70–99)
Glucose-Capillary: 151 mg/dL — ABNORMAL HIGH (ref 70–99)
Glucose-Capillary: 187 mg/dL — ABNORMAL HIGH (ref 70–99)
Glucose-Capillary: 168 mg/dL — ABNORMAL HIGH (ref 70–99)

## 2019-06-19 IMAGING — DX DG CHEST 1V PORT
1 series · 1 of 1 positions shown · non-contrast
Comparison: None.

CLINICAL DATA: PICC (peripherally inserted central catheter) in
place

EXAM:
PORTABLE CHEST 1 VIEW

[chest]
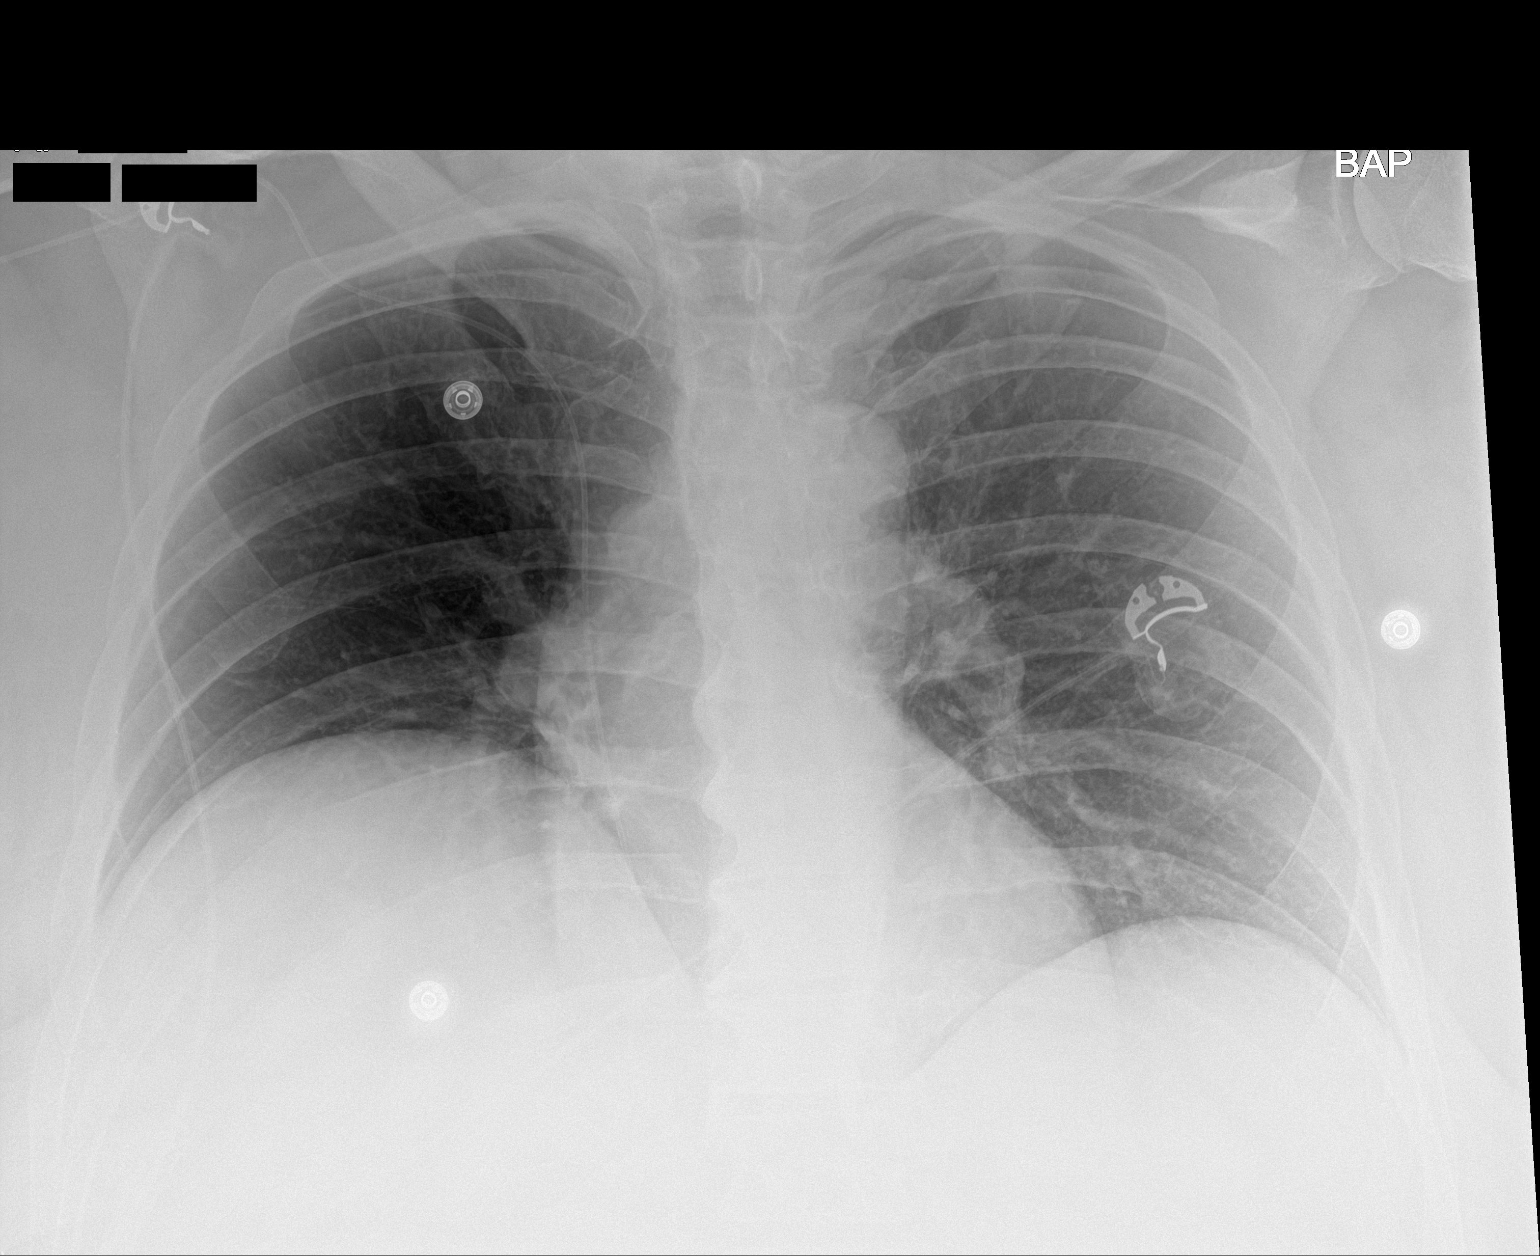

[1 of 1 positions shown; findings below may reference images not displayed]

FINDINGS: PICC line with tip in the distal SVC. Low lung volumes and elevation
RIGHT hemidiaphragm. Lungs are clear. No pneumothorax.
IMPRESSION: PICC line in good position.

## 2019-06-19 MED ORDER — SODIUM CHLORIDE 0.9% FLUSH
10.0000 mL | Freq: Two times a day (BID) | INTRAVENOUS | Status: DC
  Administered 2019-06-19 – 2019-06-23 (×2): 10 mL

## 2019-06-19 MED ORDER — SODIUM CHLORIDE 0.9% FLUSH
10.0000 mL | INTRAVENOUS | Status: DC | PRN
  Administered 2019-06-25 – 2019-06-26 (×2): 10 mL

## 2019-06-19 MED ORDER — CHLORHEXIDINE GLUCONATE CLOTH 2 % EX PADS
6.0000 | MEDICATED_PAD | Freq: Every day | CUTANEOUS | Status: DC
  Administered 2019-06-19 – 2019-06-29 (×10): 6 via TOPICAL

## 2019-06-19 MED ORDER — MAGNESIUM SULFATE 2 GM/50ML IV SOLN: 2.0000 g | Freq: Once | INTRAVENOUS | Status: DC

## 2019-06-19 MED ORDER — POTASSIUM CHLORIDE CRYS ER 20 MEQ PO TBCR
40.0000 meq | EXTENDED_RELEASE_TABLET | Freq: Once | ORAL | Status: AC
  Administered 2019-06-19: 40 meq via ORAL
  Filled 2019-06-19: qty 2

## 2019-06-19 MED ORDER — MAGNESIUM SULFATE IN D5W 1-5 GM/100ML-% IV SOLN
1.0000 g | Freq: Once | INTRAVENOUS | Status: AC
  Administered 2019-06-19: 1 g via INTRAVENOUS
  Filled 2019-06-19: qty 100

## 2019-06-19 NOTE — Progress Notes (Signed)
Robert Sullivan for Infectious Disease  Date of Admission:  06/10/2019     Total days of antibiotics 9 Cefazolin 12/2 >> Cefepime 12/1 >> 12/1 Vancomycin 12/1 >> 12/02 Ceftriaxone 12/01 >> 12/01         ASSESSMENT:  Robert Sullivan continues to receive Cefazolin for MSSA bacteremia complicated by left hip abscess s/p aspiration and culture with drain placement. Drainage appears to be slowing now at 50 cc over the last 24 hours. Abscess culture obtained during aspiration with MSSA. Repeat blood cultures finalized without growth to date and TEE without vegetation and preserved heart valve structure and function. Discuss plan of care to include 6 weeks of IV therapy with Cefazolin. Drain management will continue per radiology. Does have macular rash located his bilateral upper extremities and will check CBC w/diff to see if there is any eosinophilia.   PLAN:  1. Continue current dose of Cefazolin. 2. Will need PICC line 3. Drain management per radiology. 4. Diabetes management per primary team.    Principal Problem:   MSSA bacteremia Active Problems:   Cellulitis, gluteal, left   Acute lower UTI   Sepsis ([place])   Uncontrolled diabetes mellitus ([place])   Obesity, Class III, BMI 40-49.9 (morbid obesity) ([place])   . bisacodyl  10 mg Rectal Once  . cholecalciferol  2,000 Units Oral Daily  . heparin  5,000 Units Subcutaneous Q8H  . insulin aspart  0-15 Units Subcutaneous TID WC  . insulin aspart  0-5 Units Subcutaneous QHS  . insulin aspart  8 Units Subcutaneous TID WC  . insulin glargine  25 Units Subcutaneous Daily  . loratadine  10 mg Oral Daily  . multivitamin with minerals  1 tablet Oral Daily  . Ensure Max Protein  11 oz Oral Daily  . sodium chloride flush  5 mL Intracatheter Q8H  . vitamin C  1,000 mg Oral Daily    SUBJECTIVE:  Afebrile overnight with no acute events. Surgical specimens Staph Aureus. Drain output of about 50 cc in the last 24 hours. Has frustration that this  has required 2 treatments.   Allergies  Allergen Reactions  . Sulfa Antibiotics     Total body rash     Review of Systems: Review of Systems  Constitutional: Negative for chills, fever and weight loss.  Respiratory: Negative for cough, shortness of breath and wheezing.   Cardiovascular: Negative for chest pain and leg swelling.  Gastrointestinal: Negative for abdominal pain, constipation, diarrhea, nausea and vomiting.  Skin: Negative for rash.    OBJECTIVE: Vitals:   06/18/19 2055 06/19/19 0514 06/19/19 0849 06/19/19 1002  BP: (!) 148/86 124/62 (!) 152/88   Pulse: 99 88 (!) 102   Resp: 17 17 18    Temp: 98 F (36.7 C) 98.2 F (36.8 C) 97.9 F (36.6 C)   TempSrc: Oral Oral Oral   SpO2: 98% 100% (!) 87% 94%  Weight:      Height:       Body mass index is 46.93 kg/m.  Physical Exam Constitutional:      General: He is not in acute distress.    Appearance: He is well-developed. He is obese.     Comments: Lying in bed with head of bed elevated; pleasant.   Cardiovascular:     Rate and Rhythm: Normal rate and regular rhythm.     Heart sounds: Normal heart sounds.  Pulmonary:     Effort: Pulmonary effort is normal.     Breath sounds: Normal breath sounds.  Musculoskeletal:     Comments: JP drain charged and currently without drainage.   Skin:    General: Skin is warm and dry.     Comments: Red splotchy rash located on bilateral upper arms.   Neurological:     Mental Status: He is alert and oriented to person, place, and time.  Psychiatric:        Mood and Affect: Mood normal.     Lab Results Lab Results  Component Value Date   WBC 10.2 06/19/2019   HGB 11.4 (L) 06/19/2019   HCT 34.4 (L) 06/19/2019   MCV 93.5 06/19/2019   PLT 143 (L) 06/19/2019    Lab Results  Component Value Date   CREATININE 0.49 (L) 06/19/2019   BUN 14 06/19/2019   NA 136 06/19/2019   K 3.9 06/19/2019   CL 104 06/19/2019   CO2 24 06/19/2019    Lab Results  Component Value Date    ALT 27 06/18/2019   AST 82 (H) 06/18/2019   ALKPHOS 300 (H) 06/18/2019   BILITOT 1.3 (H) 06/18/2019     Microbiology: Recent Results (from the past 240 hour(s))  Blood culture (routine x 2)     Status: Abnormal   Collection Time: 06/10/19  1:25 PM   Specimen: BLOOD  Result Value Ref Range Status   Specimen Description BLOOD LEFT ANTECUBITAL  Final   Special Requests   Final    BOTTLES DRAWN AEROBIC AND ANAEROBIC Blood Culture adequate volume   Culture  Setup Time   Final    GRAM POSITIVE COCCI IN CLUSTERS IN BOTH AEROBIC AND ANAEROBIC BOTTLES CRITICAL RESULT CALLED TO, READ BACK BY AND VERIFIED WITH: K. AMEND PHARMD, AT [place] 06/11/19 BY D. VANHOOK    Culture (A)  Final    STAPHYLOCOCCUS AUREUS SUSCEPTIBILITIES PERFORMED ON PREVIOUS CULTURE WITHIN THE LAST 5 DAYS. Performed at [facility], [place] [address] [place], [place] [address]    Report Status 06/13/2019 FINAL  Final  Blood culture (routine x 2)     Status: Abnormal   Collection Time: 06/10/19  1:35 PM   Specimen: BLOOD  Result Value Ref Range Status   Specimen Description BLOOD BLOOD RIGHT FOREARM  Final   Special Requests   Final    BOTTLES DRAWN AEROBIC AND ANAEROBIC Blood Culture adequate volume   Culture  Setup Time   Final    GRAM POSITIVE COCCI AEROBIC BOTTLE ONLY CRITICAL VALUE NOTED.  VALUE IS CONSISTENT WITH PREVIOUSLY REPORTED AND CALLED VALUE. Performed at [facility], [place] [address], [place], [place] [address]    Culture STAPHYLOCOCCUS AUREUS (A)  Final   Report Status 06/13/2019 FINAL  Final   Organism ID, Bacteria STAPHYLOCOCCUS AUREUS  Final      Susceptibility   Staphylococcus aureus - MIC*    CIPROFLOXACIN <=0.5 SENSITIVE Sensitive     ERYTHROMYCIN <=0.25 SENSITIVE Sensitive     GENTAMICIN <=0.5 SENSITIVE Sensitive     OXACILLIN 0.5 SENSITIVE Sensitive     TETRACYCLINE >=16 RESISTANT Resistant     VANCOMYCIN 1 SENSITIVE Sensitive     TRIMETH/SULFA <=10 SENSITIVE Sensitive      CLINDAMYCIN <=0.25 SENSITIVE Sensitive     RIFAMPIN <=0.5 SENSITIVE Sensitive     Inducible Clindamycin NEGATIVE Sensitive     * STAPHYLOCOCCUS AUREUS  Blood Culture ID Panel (Reflexed)     Status: Abnormal   Collection Time: 06/10/19  1:35 PM  Result Value Ref Range Status   Enterococcus species NOT DETECTED  NOT DETECTED Final   Listeria monocytogenes NOT DETECTED NOT DETECTED Final   Staphylococcus species DETECTED (A) NOT DETECTED Final    Comment: CRITICAL RESULT CALLED TO, READ BACK BY AND VERIFIED WITH: Andres Shad PharmD 14:55 06/11/19 ([email or web address])    Staphylococcus aureus (BCID) DETECTED (A) NOT DETECTED Final    Comment: Methicillin (oxacillin) susceptible Staphylococcus aureus (MSSA). Preferred therapy is anti staphylococcal beta lactam antibiotic (Cefazolin or Nafcillin), unless clinically contraindicated. CRITICAL RESULT CALLED TO, READ BACK BY AND VERIFIED WITH: Andres Shad PharmD 14:55 06/11/19 ([email or web address])    Methicillin resistance NOT DETECTED NOT DETECTED Final   Streptococcus species NOT DETECTED NOT DETECTED Final   Streptococcus agalactiae NOT DETECTED NOT DETECTED Final   Streptococcus pneumoniae NOT DETECTED NOT DETECTED Final   Streptococcus pyogenes NOT DETECTED NOT DETECTED Final   Acinetobacter baumannii NOT DETECTED NOT DETECTED Final   Enterobacteriaceae species NOT DETECTED NOT DETECTED Final   Enterobacter cloacae complex NOT DETECTED NOT DETECTED Final   Escherichia coli NOT DETECTED NOT DETECTED Final   Klebsiella oxytoca NOT DETECTED NOT DETECTED Final   Klebsiella pneumoniae NOT DETECTED NOT DETECTED Final   Proteus species NOT DETECTED NOT DETECTED Final   Serratia marcescens NOT DETECTED NOT DETECTED Final   Haemophilus influenzae NOT DETECTED NOT DETECTED Final   Neisseria meningitidis NOT DETECTED NOT DETECTED Final   Pseudomonas aeruginosa NOT DETECTED NOT DETECTED Final   Candida albicans NOT DETECTED NOT DETECTED Final   Candida glabrata NOT DETECTED NOT  DETECTED Final   Candida krusei NOT DETECTED NOT DETECTED Final   Candida parapsilosis NOT DETECTED NOT DETECTED Final   Candida tropicalis NOT DETECTED NOT DETECTED Final    Comment: Performed at [facility], [address] [place], [place] [address]  Urine culture     Status: Abnormal   Collection Time: 06/10/19  1:45 PM   Specimen: Urine, Clean Catch  Result Value Ref Range Status   Specimen Description URINE, CLEAN CATCH  Final   Special Requests   Final    URINE, RANDOM Performed at [facility], [place] [address], [place], [place] [address]    Culture >=100,000 COLONIES/mL STAPHYLOCOCCUS AUREUS (A)  Final   Report Status 06/12/2019 FINAL  Final   Organism ID, Bacteria STAPHYLOCOCCUS AUREUS (A)  Final      Susceptibility   Staphylococcus aureus - MIC*    CIPROFLOXACIN <=0.5 SENSITIVE Sensitive     GENTAMICIN <=0.5 SENSITIVE Sensitive     NITROFURANTOIN 32 SENSITIVE Sensitive     OXACILLIN 0.5 SENSITIVE Sensitive     TETRACYCLINE >=16 RESISTANT Resistant     VANCOMYCIN 1 SENSITIVE Sensitive     TRIMETH/SULFA <=10 SENSITIVE Sensitive     CLINDAMYCIN <=0.25 SENSITIVE Sensitive     RIFAMPIN <=0.5 SENSITIVE Sensitive     Inducible Clindamycin NEGATIVE Sensitive     * >=100,000 COLONIES/mL STAPHYLOCOCCUS AUREUS  SARS CORONAVIRUS 2 (TAT 6-24 HRS) Nasopharyngeal Nasopharyngeal Swab     Status: None   Collection Time: 06/10/19  6:40 PM   Specimen: Nasopharyngeal Swab  Result Value Ref Range Status   SARS Coronavirus 2 NEGATIVE NEGATIVE Final    Comment: (NOTE) SARS-CoV-2 target nucleic acids are NOT DETECTED. The SARS-CoV-2 RNA is generally detectable in upper and lower respiratory specimens during the acute phase of infection. Negative results do not preclude SARS-CoV-2 infection, do not rule out co-infections with other pathogens, and should not be used as the sole basis for treatment or other patient management decisions. Negative results must  be combined with  clinical observations, patient history, and epidemiological information. The expected result is Negative. Fact Sheet for Patients: SugarRoll.be Fact Sheet for Healthcare Providers: https://www.woods-mathews.com/ This test is not yet approved or cleared by the [place] FDA and  has been authorized for detection and/or diagnosis of SARS-CoV-2 by FDA under an Emergency Use Authorization (EUA). This EUA will remain  in effect (meaning this test can be used) for the duration of the COVID-19 declaration under Section 56 4(b)(1) of the Act, 21 U.S.C. section 360bbb-3(b)(1), unless the authorization is terminated or revoked sooner. Performed at [facility], [place] [address] [place], [place] [address]   Culture, blood (routine x 2)     Status: None   Collection Time: 06/11/19  4:35 PM   Specimen: BLOOD LEFT HAND  Result Value Ref Range Status   Specimen Description BLOOD LEFT HAND  Final   Special Requests   Final    BOTTLES DRAWN AEROBIC AND ANAEROBIC Blood Culture adequate volume   Culture   Final    NO GROWTH 5 DAYS Performed at [facility], [place] [address] [place], [place] [address]    Report Status 06/16/2019 FINAL  Final  Culture, blood (routine x 2)     Status: None   Collection Time: 06/11/19  4:40 PM   Specimen: BLOOD RIGHT HAND  Result Value Ref Range Status   Specimen Description BLOOD RIGHT HAND  Final   Special Requests   Final    BOTTLES DRAWN AEROBIC ONLY Blood Culture adequate volume   Culture   Final    NO GROWTH 5 DAYS Performed at [facility], [place] [address] [place], [place] [address]    Report Status 06/16/2019 FINAL  Final  Aerobic/Anaerobic Culture (surgical/deep wound)     Status: None (Preliminary result)   Collection Time: 06/17/19 11:27 AM   Specimen: Abscess  Result Value Ref Range Status   Specimen Description ABSCESS LEFT GLUTEAL  Final   Special Requests Normal  Final   Gram Stain    Final    ABUNDANT WBC PRESENT,BOTH PMN AND MONONUCLEAR ABUNDANT GRAM POSITIVE COCCI Performed at [facility], [place] [address], [place], [place] [address]    Culture   Final    MODERATE STAPHYLOCOCCUS AUREUS NO ANAEROBES ISOLATED; CULTURE IN PROGRESS FOR 5 DAYS    Report Status PENDING  Incomplete   Organism ID, Bacteria STAPHYLOCOCCUS AUREUS  Final      Susceptibility   Staphylococcus aureus - MIC*    CIPROFLOXACIN <=0.5 SENSITIVE Sensitive     ERYTHROMYCIN <=0.25 SENSITIVE Sensitive     GENTAMICIN <=0.5 SENSITIVE Sensitive     OXACILLIN 0.5 SENSITIVE Sensitive     TETRACYCLINE >=16 RESISTANT Resistant     VANCOMYCIN 1 SENSITIVE Sensitive     TRIMETH/SULFA <=10 SENSITIVE Sensitive     CLINDAMYCIN <=0.25 SENSITIVE Sensitive     RIFAMPIN <=0.5 SENSITIVE Sensitive     Inducible Clindamycin NEGATIVE Sensitive     * MODERATE STAPHYLOCOCCUS AUREUS     Terri Piedra, NP [place] [facility] [phone] Pager  06/19/2019  12:59 PM

## 2019-06-19 NOTE — Plan of Care (Signed)
°  Problem: Coping: °Goal: Level of anxiety will decrease °Outcome: Progressing °  °

## 2019-06-19 NOTE — Progress Notes (Signed)
PT Cancellation Note  Patient Details Name: Robert Sullivan MRN: [id number] DOB: 11-Jul-1965   Cancelled Treatment:    Reason Eval/Treat Not Completed: Fatigue/lethargy limiting ability to participate - PT checked on pt x2 this afternoon, once at 12:30 and pt declined because he needed to use the bathroom, again at 15:30 and pt was sleeping. PT to check back as schedule allows.  [place] Pager [phone]  Office [phone]    [place] 06/19/2019, 4:59 PM

## 2019-06-19 NOTE — Progress Notes (Addendum)
PROGRESS NOTE    Robert Sullivan  WVP:[id number] DOB: [birth year] DOA: 06/10/2019 PCP: Cathleen Corti, PA-C    Brief Narrative:  53 y.o. male with history of uncontrolled type 2 diabetes mellitus, morbid obesity was admitted to [facility] from 11/2-11/7 with E. coli UTI, uncontrolled diabetes and a fall that resulted in a left gluteal hematoma, he was treated with IV antibiotics, discharged home on Bactrim, completed course and also on insulin. -He felt well initially after discharge, in the last 7 to 10 days he started having fevers and chills, profuse sweats, 5 to 6 days ago he started having increased pain and swelling at the site of his gluteal hematoma, he also noticed that his urine was darker but denied any dysuria or change in odor presented to the emergency room today with weakness, worsening left hip pain, fevers chills and sweats. -In the ED he was noted to have a sodium of 131, CBG of 380, albumin of 1.8, ALP of 224,  lactic acid of 5.5, white count of 20,000.  In the ED he was noted to have an elevated D-dimer subsequently EDP proceeded with CT angiogram which was negative for pulmonary embolism, his UA was significantly abnormal with cloudy urine, positive nitrite, leukocyte esterase, numerous WBC and bacteria.  In addition noted to have redness and tenderness of his left gluteal region  Assessment & Plan:   Principal Problem:   MSSA bacteremia Active Problems:   Acute lower UTI   Sepsis ([place])   Uncontrolled diabetes mellitus ([place])   Obesity, Class III, BMI 40-49.9 (morbid obesity) (HCC)   Cellulitis, gluteal, left  Sepsis secondary to MSSA bacteremia  Blood cx pos for MSSA, ID now consulted with recommendation for 2d echo to r/o endocarditis, neg findings  TEE performed 12/7 with no evidence of vegetations  currently on ancef per ID  CT reviewed, no drainable abscess identified in L hip. Have ordered follow up MRI L hip, reviewed, findings of large multilocular septated cystic  mass within gluteal musculature extending in to the trochanteric bursa which could be chronic organizing hematoma/seroma or intramuscular abscess  Orthopedic surgery consulted per below  Lactate had improved  Urinary tract infection  Continued on ancef per above  Presenting UA suggestive of UTI, however, pt denies dysuria or other symptoms  Left gluteal myositis, abscess  Per above, pt is continued on ancef  Marked pain with findings suggestive of myositis on CT  f/u MRI findings per above  Pt reports symptoms seem to be improving. Pt better able to ambulate  Appreciate Orthopedics input.   Pt now s/p drain placement by IR 12/7.  Pressure bulb changed 3 times within 12 hours.  Drainage appears to be slowing down, although if the abscess was indeed 22 cm, there is probably continued loculated fluid.  WBC is improved to 10.2 this morning.  Repeat labs tomorrow  Does have petechial rash. We will start the patient on antihistamine.  Discussed with ID - they aren't convinced that this is a drug reaction. Will watch closely.    Currently cultures are growing out staph aureus  Will plan on re-imaging once drain is putting out less than 91m per day. Discussed with radiology about best study: CT Pelvis with contrast, add line to continue into right thigh for f/u of abscess  Morbid obesity  BMI of 47  diet/lifestyle modification recommended  Uncontrolled type 2 diabetes mellitus  Hemoglobin A1c was 11 recently  Resume Lantus and NovoLog premeal and sliding scale insulin  Glucose  trends stable  Suspected cirrhosis  Noted on imaging, patient denies any alcohol use. Suspect NASH/fatty liver disease is likely the primary culprit  Hep C neg  GI follow up recommended on d/c  DVT prophylaxis: Heparin subQ Code Status: Full Family Communication: Pt in room, family not at bedside Disposition Plan: Uncertain at this time  Consultants:   ID  Cardiology  Orthopedic  Surgery  IR  Procedures:   TEE 12/7  IR guided drain placement to L thigh 12/8  Antimicrobials: Anti-infectives (From admission, onward)   Start     Dose/Rate Route Frequency Ordered Stop   06/11/19 2200  ceFAZolin (ANCEF) IVPB 2g/100 mL premix     2 g 200 mL/hr over 30 Minutes Intravenous Every 8 hours 06/11/19 1456     06/11/19 0200  vancomycin (VANCOCIN) 1,750 mg in sodium chloride 0.9 % 500 mL IVPB  Status:  Discontinued     1,750 mg 250 mL/hr over 120 Minutes Intravenous Every 12 hours 06/10/19 1257 06/11/19 1456   06/10/19 2200  ceFEPIme (MAXIPIME) 2 g in sodium chloride 0.9 % 100 mL IVPB  Status:  Discontinued     2 g 200 mL/hr over 30 Minutes Intravenous Every 8 hours 06/10/19 1257 06/10/19 1740   06/10/19 1745  cefTRIAXone (ROCEPHIN) 1 g in sodium chloride 0.9 % 100 mL IVPB  Status:  Discontinued     1 g 200 mL/hr over 30 Minutes Intravenous Every 24 hours 06/10/19 1740 06/11/19 1456   06/10/19 1245  ceFEPIme (MAXIPIME) 2 g in sodium chloride 0.9 % 100 mL IVPB     2 g 200 mL/hr over 30 Minutes Intravenous  Once 06/10/19 1239 06/10/19 1411   06/10/19 1245  vancomycin (VANCOCIN) IVPB 1000 mg/200 mL premix  Status:  Discontinued     1,000 mg 200 mL/hr over 60 Minutes Intravenous  Once 06/10/19 1239 06/10/19 1241   06/10/19 1245  vancomycin (VANCOCIN) 2,500 mg in sodium chloride 0.9 % 500 mL IVPB     2,500 mg 250 mL/hr over 120 Minutes Intravenous  Once 06/10/19 1241 06/10/19 1635      Subjective: Pain under control.  Has difficulty ambulating.  Is working with physical therapy.  Appetite is improving.  Continues to have petechial rash  Objective: Vitals:   06/19/19 0514 06/19/19 0849 06/19/19 1002 06/19/19 1320  BP: 124/62 (!) 152/88    Pulse: 88 (!) 102  100  Resp: 17 18  16   Temp: 98.2 F (36.8 C) 97.9 F (36.6 C)    TempSrc: Oral Oral    SpO2: 100% (!) 87% 94%   Weight:      Height:        Intake/Output Summary (Last 24 hours) at 06/19/2019 1408 Last  data filed at 06/19/2019 1300 Gross per 24 hour  Intake 1605 ml  Output 730 ml  Net 875 ml   Filed Weights   06/16/19 0430 06/16/19 1117 06/17/19 2034  Weight: (!) 170.2 kg (!) 170.2 kg (!) 170.3 kg    Examination: General exam: Awake, alert, no acute distress. Respiratory system: Good respiratory effort with no wheezes, rales, rhonchi Cardiovascular system: Regular rate.  Normal S1-S2 sounds. Gastrointestinal system: Soft, nontender, nondistended. Central nervous system: CN2-12 grossly intact, strength intact Extremities: Minimal edema.  Pulses normal. Skin: Drain in left buttock.  Skin around drain normal without erythema.  Petechial rash in right upper arm, as well as in ankles bilaterally. Psychiatry: Mood normal // no visual hallucinations   Data Reviewed: I have personally  reviewed following labs and imaging studies  CBC: Recent Labs  Lab 06/15/19 0545 06/16/19 0512 06/17/19 0611 06/18/19 0211 06/19/19 0430  WBC 14.8* 14.2* 13.2* 10.8* 10.2  HGB 11.4* 11.2* 12.3* 12.2* 11.4*  HCT 33.3* 32.8* 36.0* 36.7* 34.4*  MCV 89.3 89.1 90.7 92.2 93.5  PLT 123* 123* 135* 136* 952*   Basic Metabolic Panel: Recent Labs  Lab 06/15/19 0545 06/16/19 0512 06/17/19 0611 06/18/19 0211 06/19/19 0430  NA 129* 131* 134* 135 136  K 4.4 4.0 4.0 3.9 3.9  CL 100 100 101 101 104  CO2 22 23 22 24 24   GLUCOSE 195* 209* 173* 142* 133*  BUN 17 18 18 15 14   CREATININE 0.62 0.72 0.59* 0.55* 0.49*  CALCIUM 7.8* 7.9* 7.9* 8.0* 7.9*   GFR: Estimated Creatinine Clearance: 181.5 mL/min (A) (by C-G formula based on SCr of 0.49 mg/dL (L)). Liver Function Tests: Recent Labs  Lab 06/14/19 0442 06/15/19 0545 06/16/19 0512 06/17/19 0611 06/18/19 0211  AST 88* 82* 81* 80* 82*  ALT 32 29 28 27 27   ALKPHOS 282* 292* 299* 300* 300*  BILITOT 1.5* 1.4* 1.2 1.4* 1.3*  PROT 6.2* 5.7* 5.9* 6.4* 6.6  ALBUMIN 1.3* 1.2* 1.2* 1.3* 1.3*   No results for input(s): LIPASE, AMYLASE in the last 168  hours. No results for input(s): AMMONIA in the last 168 hours. Coagulation Profile: Recent Labs  Lab 06/16/19 1656  INR 1.4*   Cardiac Enzymes: No results for input(s): CKTOTAL, CKMB, CKMBINDEX, TROPONINI in the last 168 hours. BNP (last 3 results) No results for input(s): PROBNP in the last 8760 hours. HbA1C: No results for input(s): HGBA1C in the last 72 hours. CBG: Recent Labs  Lab 06/18/19 1116 06/18/19 1623 06/18/19 2055 06/19/19 0716 06/19/19 1109  GLUCAP 150* 195* 142* 123* 151*   Lipid Profile: No results for input(s): CHOL, HDL, LDLCALC, TRIG, CHOLHDL, LDLDIRECT in the last 72 hours. Thyroid Function Tests: No results for input(s): TSH, T4TOTAL, FREET4, T3FREE, THYROIDAB in the last 72 hours. Anemia Panel: No results for input(s): VITAMINB12, FOLATE, FERRITIN, TIBC, IRON, RETICCTPCT in the last 72 hours. Sepsis Labs: No results for input(s): PROCALCITON, LATICACIDVEN in the last 168 hours.  Recent Results (from the past 240 hour(s))  Blood culture (routine x 2)     Status: Abnormal   Collection Time: 06/10/19  1:25 PM   Specimen: BLOOD  Result Value Ref Range Status   Specimen Description BLOOD LEFT ANTECUBITAL  Final   Special Requests   Final    BOTTLES DRAWN AEROBIC AND ANAEROBIC Blood Culture adequate volume   Culture  Setup Time   Final    GRAM POSITIVE COCCI IN CLUSTERS IN BOTH AEROBIC AND ANAEROBIC BOTTLES CRITICAL RESULT CALLED TO, READ BACK BY AND VERIFIED WITH: K. AMEND PHARMD, AT [id number] 06/11/19 BY D. VANHOOK    Culture (A)  Final    STAPHYLOCOCCUS AUREUS SUSCEPTIBILITIES PERFORMED ON PREVIOUS CULTURE WITHIN THE LAST 5 DAYS. Performed at [facility], [place] [address] [place], [place] [address]    Report Status 06/13/2019 FINAL  Final  Blood culture (routine x 2)     Status: Abnormal   Collection Time: 06/10/19  1:35 PM   Specimen: BLOOD  Result Value Ref Range Status   Specimen Description BLOOD BLOOD RIGHT FOREARM  Final   Special  Requests   Final    BOTTLES DRAWN AEROBIC AND ANAEROBIC Blood Culture adequate volume   Culture  Setup Time   Final    GRAM  POSITIVE COCCI AEROBIC BOTTLE ONLY CRITICAL VALUE NOTED.  VALUE IS CONSISTENT WITH PREVIOUSLY REPORTED AND CALLED VALUE. Performed at [facility], [place] [address] [place], [place] [address]    Culture STAPHYLOCOCCUS AUREUS (A)  Final   Report Status 06/13/2019 FINAL  Final   Organism ID, Bacteria STAPHYLOCOCCUS AUREUS  Final      Susceptibility   Staphylococcus aureus - MIC*    CIPROFLOXACIN <=0.5 SENSITIVE Sensitive     ERYTHROMYCIN <=0.25 SENSITIVE Sensitive     GENTAMICIN <=0.5 SENSITIVE Sensitive     OXACILLIN 0.5 SENSITIVE Sensitive     TETRACYCLINE >=16 RESISTANT Resistant     VANCOMYCIN 1 SENSITIVE Sensitive     TRIMETH/SULFA <=10 SENSITIVE Sensitive     CLINDAMYCIN <=0.25 SENSITIVE Sensitive     RIFAMPIN <=0.5 SENSITIVE Sensitive     Inducible Clindamycin NEGATIVE Sensitive     * STAPHYLOCOCCUS AUREUS  Blood Culture ID Panel (Reflexed)     Status: Abnormal   Collection Time: 06/10/19  1:35 PM  Result Value Ref Range Status   Enterococcus species NOT DETECTED NOT DETECTED Final   Listeria monocytogenes NOT DETECTED NOT DETECTED Final   Staphylococcus species DETECTED (A) NOT DETECTED Final    Comment: CRITICAL RESULT CALLED TO, READ BACK BY AND VERIFIED WITH: Andres Shad PharmD 14:55 06/11/19 ([email or web address])    Staphylococcus aureus (BCID) DETECTED (A) NOT DETECTED Final    Comment: Methicillin (oxacillin) susceptible Staphylococcus aureus (MSSA). Preferred therapy is anti staphylococcal beta lactam antibiotic (Cefazolin or Nafcillin), unless clinically contraindicated. CRITICAL RESULT CALLED TO, READ BACK BY AND VERIFIED WITH: Andres Shad PharmD 14:55 06/11/19 ([email or web address])    Methicillin resistance NOT DETECTED NOT DETECTED Final   Streptococcus species NOT DETECTED NOT DETECTED Final   Streptococcus agalactiae NOT DETECTED NOT DETECTED Final    Streptococcus pneumoniae NOT DETECTED NOT DETECTED Final   Streptococcus pyogenes NOT DETECTED NOT DETECTED Final   Acinetobacter baumannii NOT DETECTED NOT DETECTED Final   Enterobacteriaceae species NOT DETECTED NOT DETECTED Final   Enterobacter cloacae complex NOT DETECTED NOT DETECTED Final   Escherichia coli NOT DETECTED NOT DETECTED Final   Klebsiella oxytoca NOT DETECTED NOT DETECTED Final   Klebsiella pneumoniae NOT DETECTED NOT DETECTED Final   Proteus species NOT DETECTED NOT DETECTED Final   Serratia marcescens NOT DETECTED NOT DETECTED Final   Haemophilus influenzae NOT DETECTED NOT DETECTED Final   Neisseria meningitidis NOT DETECTED NOT DETECTED Final   Pseudomonas aeruginosa NOT DETECTED NOT DETECTED Final   Candida albicans NOT DETECTED NOT DETECTED Final   Candida glabrata NOT DETECTED NOT DETECTED Final   Candida krusei NOT DETECTED NOT DETECTED Final   Candida parapsilosis NOT DETECTED NOT DETECTED Final   Candida tropicalis NOT DETECTED NOT DETECTED Final    Comment: Performed at [facility], [address], [place], [place] [address]  Urine culture     Status: Abnormal   Collection Time: 06/10/19  1:45 PM   Specimen: Urine, Clean Catch  Result Value Ref Range Status   Specimen Description URINE, CLEAN CATCH  Final   Special Requests   Final    URINE, RANDOM Performed at [facility], [place] [address] [place], [place] [address]    Culture >=100,000 COLONIES/mL STAPHYLOCOCCUS AUREUS (A)  Final   Report Status 06/12/2019 FINAL  Final   Organism ID, Bacteria STAPHYLOCOCCUS AUREUS (A)  Final      Susceptibility   Staphylococcus aureus - MIC*    CIPROFLOXACIN <=0.5 SENSITIVE Sensitive  GENTAMICIN <=0.5 SENSITIVE Sensitive     NITROFURANTOIN 32 SENSITIVE Sensitive     OXACILLIN 0.5 SENSITIVE Sensitive     TETRACYCLINE >=16 RESISTANT Resistant     VANCOMYCIN 1 SENSITIVE Sensitive     TRIMETH/SULFA <=10 SENSITIVE Sensitive     CLINDAMYCIN <=0.25  SENSITIVE Sensitive     RIFAMPIN <=0.5 SENSITIVE Sensitive     Inducible Clindamycin NEGATIVE Sensitive     * >=100,000 COLONIES/mL STAPHYLOCOCCUS AUREUS  SARS CORONAVIRUS 2 (TAT 6-24 HRS) Nasopharyngeal Nasopharyngeal Swab     Status: None   Collection Time: 06/10/19  6:40 PM   Specimen: Nasopharyngeal Swab  Result Value Ref Range Status   SARS Coronavirus 2 NEGATIVE NEGATIVE Final    Comment: (NOTE) SARS-CoV-2 target nucleic acids are NOT DETECTED. The SARS-CoV-2 RNA is generally detectable in upper and lower respiratory specimens during the acute phase of infection. Negative results do not preclude SARS-CoV-2 infection, do not rule out co-infections with other pathogens, and should not be used as the sole basis for treatment or other patient management decisions. Negative results must be combined with clinical observations, patient history, and epidemiological information. The expected result is Negative. Fact Sheet for Patients: SugarRoll.be Fact Sheet for Healthcare Providers: https://www.woods-mathews.com/ This test is not yet approved or cleared by the [place] FDA and  has been authorized for detection and/or diagnosis of SARS-CoV-2 by FDA under an Emergency Use Authorization (EUA). This EUA will remain  in effect (meaning this test can be used) for the duration of the COVID-19 declaration under Section 56 4(b)(1) of the Act, 21 U.S.C. section 360bbb-3(b)(1), unless the authorization is terminated or revoked sooner. Performed at [facility], [place] [address] [place], [place] [address]   Culture, blood (routine x 2)     Status: None   Collection Time: 06/11/19  4:35 PM   Specimen: BLOOD LEFT HAND  Result Value Ref Range Status   Specimen Description BLOOD LEFT HAND  Final   Special Requests   Final    BOTTLES DRAWN AEROBIC AND ANAEROBIC Blood Culture adequate volume   Culture   Final    NO GROWTH 5 DAYS Performed at  [facility], [place] [address], [place], [place] [address]    Report Status 06/16/2019 FINAL  Final  Culture, blood (routine x 2)     Status: None   Collection Time: 06/11/19  4:40 PM   Specimen: BLOOD RIGHT HAND  Result Value Ref Range Status   Specimen Description BLOOD RIGHT HAND  Final   Special Requests   Final    BOTTLES DRAWN AEROBIC ONLY Blood Culture adequate volume   Culture   Final    NO GROWTH 5 DAYS Performed at [facility], [place] [address], [place], [place] [address]    Report Status 06/16/2019 FINAL  Final  Aerobic/Anaerobic Culture (surgical/deep wound)     Status: None (Preliminary result)   Collection Time: 06/17/19 11:27 AM   Specimen: Abscess  Result Value Ref Range Status   Specimen Description ABSCESS LEFT GLUTEAL  Final   Special Requests Normal  Final   Gram Stain   Final    ABUNDANT WBC PRESENT,BOTH PMN AND MONONUCLEAR ABUNDANT GRAM POSITIVE COCCI Performed at [facility], [place] [address], [place], [place] [address]    Culture   Final    MODERATE STAPHYLOCOCCUS AUREUS NO ANAEROBES ISOLATED; CULTURE IN PROGRESS FOR 5 DAYS    Report Status PENDING  Incomplete   Organism ID, Bacteria  STAPHYLOCOCCUS AUREUS  Final      Susceptibility   Staphylococcus aureus - MIC*    CIPROFLOXACIN <=0.5 SENSITIVE Sensitive     ERYTHROMYCIN <=0.25 SENSITIVE Sensitive     GENTAMICIN <=0.5 SENSITIVE Sensitive     OXACILLIN 0.5 SENSITIVE Sensitive     TETRACYCLINE >=16 RESISTANT Resistant     VANCOMYCIN 1 SENSITIVE Sensitive     TRIMETH/SULFA <=10 SENSITIVE Sensitive     CLINDAMYCIN <=0.25 SENSITIVE Sensitive     RIFAMPIN <=0.5 SENSITIVE Sensitive     Inducible Clindamycin NEGATIVE Sensitive     * MODERATE STAPHYLOCOCCUS AUREUS     Radiology Studies: [place] EKG SITE RITE  Result Date: 06/19/2019 If Site Rite image not attached, placement could not be confirmed due to current cardiac rhythm.   Scheduled Meds: . bisacodyl  10 mg Rectal Once  .  cholecalciferol  2,000 Units Oral Daily  . heparin  5,000 Units Subcutaneous Q8H  . insulin aspart  0-15 Units Subcutaneous TID WC  . insulin aspart  0-5 Units Subcutaneous QHS  . insulin aspart  8 Units Subcutaneous TID WC  . insulin glargine  25 Units Subcutaneous Daily  . loratadine  10 mg Oral Daily  . multivitamin with minerals  1 tablet Oral Daily  . Ensure Max Protein  11 oz Oral Daily  . sodium chloride flush  5 mL Intracatheter Q8H  . vitamin C  1,000 mg Oral Daily   Continuous Infusions: . sodium chloride 500 mL (06/17/19 0545)  .  ceFAZolin (ANCEF) IV 2 g (06/19/19 1258)     LOS: 9 days   Truett Mainland, DO [facility] Pager On Amion  If 7PM-7AM, please contact night-coverage 06/19/2019, 2:08 PM

## 2019-06-19 NOTE — Progress Notes (Signed)
Peripherally Inserted Central Catheter/Midline Placement  The IV Nurse has discussed with the patient and/or persons authorized to consent for the patient, the purpose of this procedure and the potential benefits and risks involved with this procedure.  The benefits include less needle sticks, lab draws from the catheter, and the patient may be discharged home with the catheter. Risks include, but not limited to, infection, bleeding, blood clot (thrombus formation), and puncture of an artery; nerve damage and irregular heartbeat and possibility to perform a PICC exchange if needed/ordered by physician.  Alternatives to this procedure were also discussed.  Bard Power PICC patient education guide, fact sheet on infection prevention and patient information card has been provided to patient /or left at bedside.    PICC/Midline Placement Documentation  PICC Single Lumen 06/19/19 PICC Right Cephalic 47 cm 2 cm (Active)  Indication for Insertion or Continuance of Line Home intravenous therapies (PICC only) 06/19/19 1400  Exposed Catheter (cm) 2 cm 06/19/19 1400  Site Assessment Clean;Dry;Intact 06/19/19 1400  Line Status Flushed;Saline locked;Blood return noted 06/19/19 1400  Dressing Type Transparent;Securing device 06/19/19 1400  Dressing Status Clean;Dry;Intact;Antimicrobial disc in place 06/19/19 1400  Line Care Connections checked and tightened 06/19/19 1400  Dressing Intervention New dressing;Other (Comment) 06/19/19 1400  Dressing Change Due 06/26/19 06/19/19 1400   Patient signed written consent    Virgilio Belling 06/19/2019, 2:50 PM

## 2019-06-20 ENCOUNTER — Inpatient Hospital Stay (HOSPITAL_COMMUNITY): Payer: Self-pay

## 2019-06-20 LAB — CBC WITH DIFFERENTIAL/PLATELET
Abs Immature Granulocytes: 0.25 10*3/uL — ABNORMAL HIGH (ref 0.00–0.07)
Basophils Absolute: 0.1 10*3/uL (ref 0.0–0.1)
Basophils Relative: 1 %
Eosinophils Absolute: 0.2 10*3/uL (ref 0.0–0.5)
Eosinophils Relative: 2 %
HCT: 33.9 % — ABNORMAL LOW (ref 39.0–52.0)
Hemoglobin: 11.2 g/dL — ABNORMAL LOW (ref 13.0–17.0)
Immature Granulocytes: 3 %
Lymphocytes Relative: 17 %
Lymphs Abs: 1.7 10*3/uL (ref 0.7–4.0)
MCH: 30.6 pg (ref 26.0–34.0)
MCHC: 33 g/dL (ref 30.0–36.0)
MCV: 92.6 fL (ref 80.0–100.0)
Monocytes Absolute: 0.7 10*3/uL (ref 0.1–1.0)
Monocytes Relative: 6 %
Neutro Abs: 7.2 10*3/uL (ref 1.7–7.7)
Neutrophils Relative %: 71 %
Platelets: 143 10*3/uL — ABNORMAL LOW (ref 150–400)
RBC: 3.66 MIL/uL — ABNORMAL LOW (ref 4.22–5.81)
RDW: 15.3 % (ref 11.5–15.5)
WBC: 10.1 10*3/uL (ref 4.0–10.5)
nRBC: 0 % (ref 0.0–0.2)

## 2019-06-20 LAB — URINALYSIS, COMPLETE (UACMP) WITH MICROSCOPIC
Bilirubin Urine: NEGATIVE
Glucose, UA: NEGATIVE mg/dL
Ketones, ur: NEGATIVE mg/dL
Leukocytes,Ua: NEGATIVE
Nitrite: NEGATIVE
Protein, ur: 30 mg/dL — AB
Specific Gravity, Urine: 1.03 (ref 1.005–1.030)
pH: 5 (ref 5.0–8.0)

## 2019-06-20 LAB — GLUCOSE, CAPILLARY
Glucose-Capillary: 134 mg/dL — ABNORMAL HIGH (ref 70–99)
Glucose-Capillary: 137 mg/dL — ABNORMAL HIGH (ref 70–99)
Glucose-Capillary: 186 mg/dL — ABNORMAL HIGH (ref 70–99)
Glucose-Capillary: 149 mg/dL — ABNORMAL HIGH (ref 70–99)

## 2019-06-20 LAB — MAGNESIUM: Magnesium: 1.9 mg/dL (ref 1.7–2.4)

## 2019-06-20 LAB — PHOSPHORUS: Phosphorus: 3.2 mg/dL (ref 2.5–4.6)

## 2019-06-20 IMAGING — CT CT PELVIS W/ CM
2 of 3 series · 17 of 46 positions shown, 19 images · IV contrast (Omni 300)
Comparison: [DATE]

CLINICAL DATA: Abscess, LEFT leg swelling, abscess drainage,
diabetes mellitus

EXAM:
CT PELVIS WITH CONTRAST
TECHNIQUE: Multidetector CT imaging of the pelvis was performed using the
standard protocol following the bolus administration of intravenous
contrast. Sagittal and coronal MPR images reconstructed from axial
data set.
CONTRAST:  100mL OMNIPAQUE IOHEXOL 300 MG/ML  SOLN IV

[Series 3: pelvis with 5.0 · axial · 0.98mm/px · z∈[-588,-168]mm · 14 of 98 slices shown, 16 images]
[im 7/98  soft-tissue]
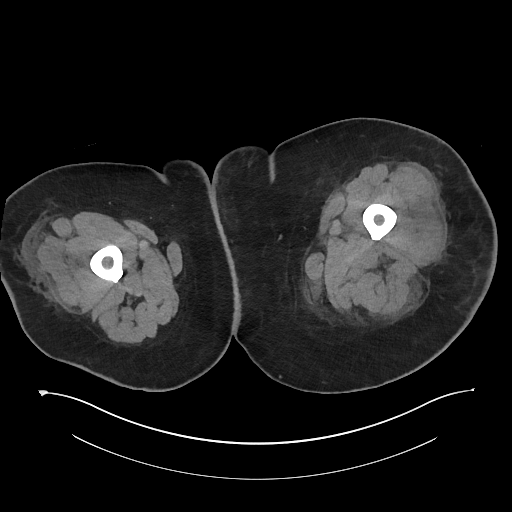
[im 7/98  bone]
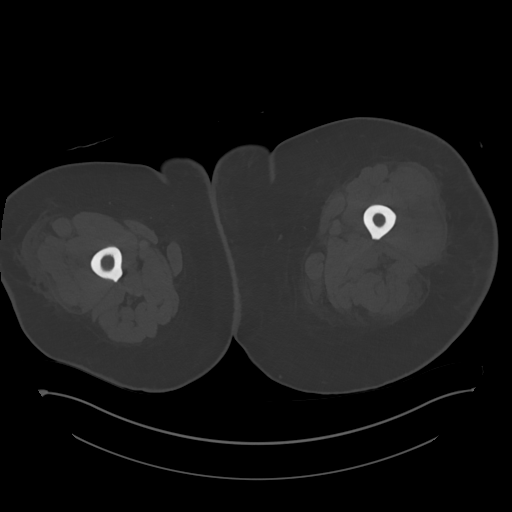
[im 13/98  soft-tissue]
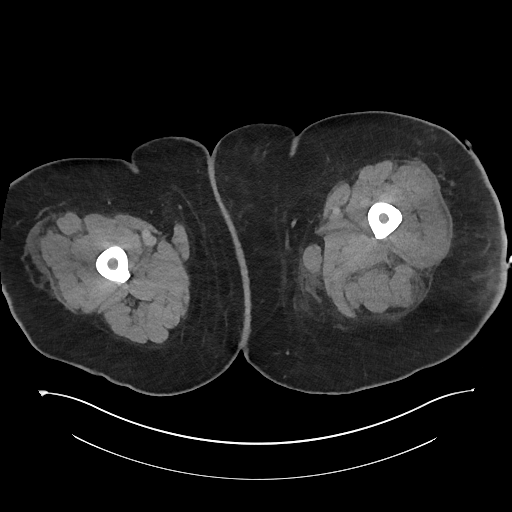
[im 19/98  soft-tissue]
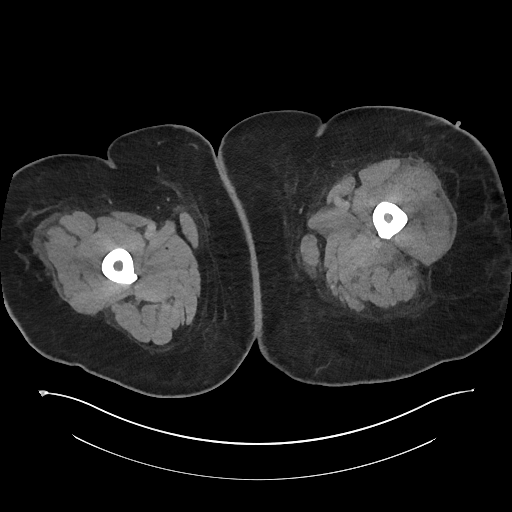
[im 26/98  soft-tissue]
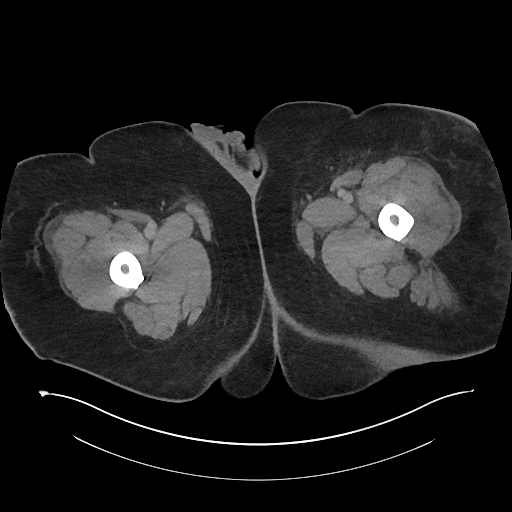
[im 32/98  soft-tissue]
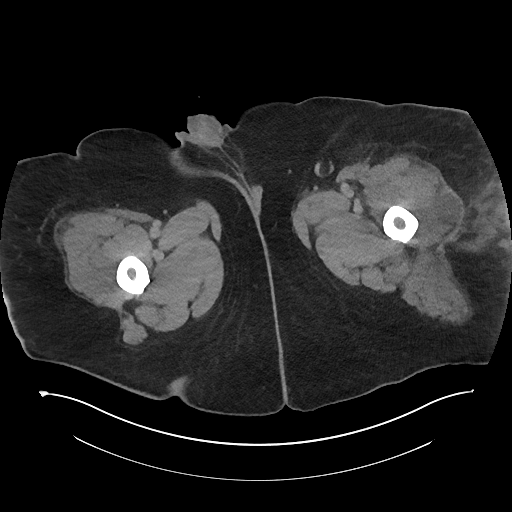
[im 38/98  soft-tissue]
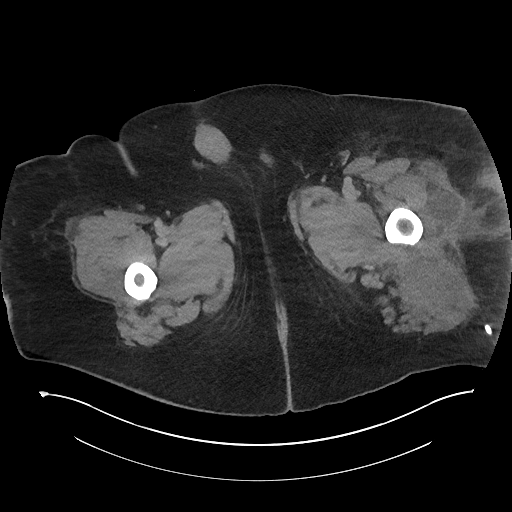
[im 44/98  soft-tissue]
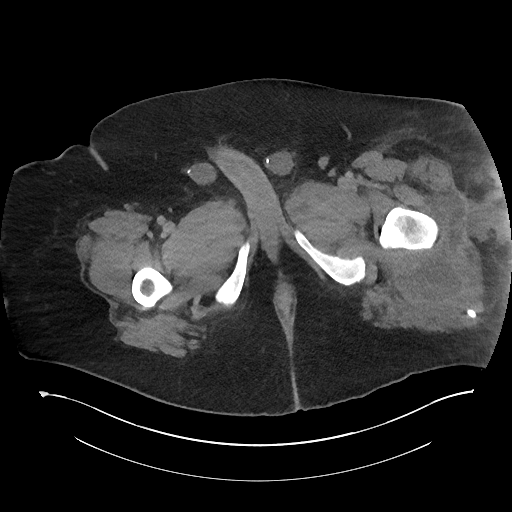
[im 54/98  soft-tissue]
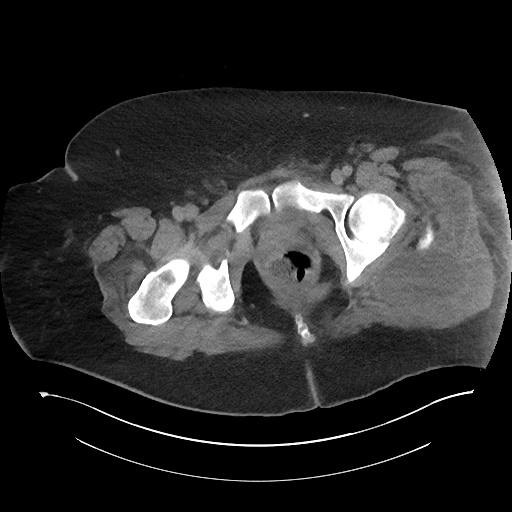
[im 60/98  soft-tissue]
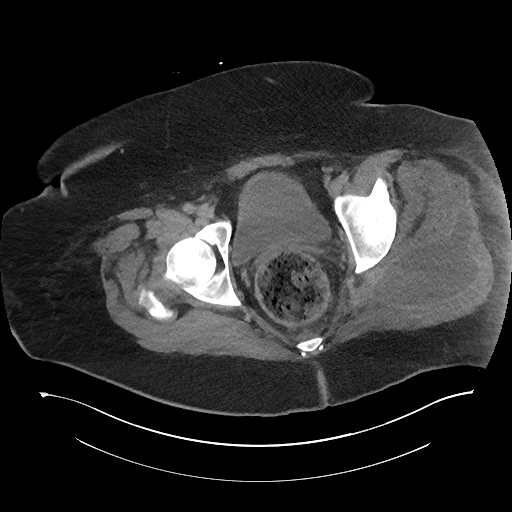
[im 60/98  bone]
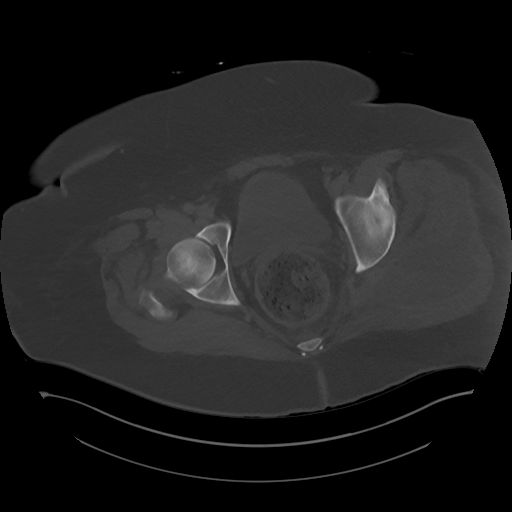
[im 66/98  soft-tissue]
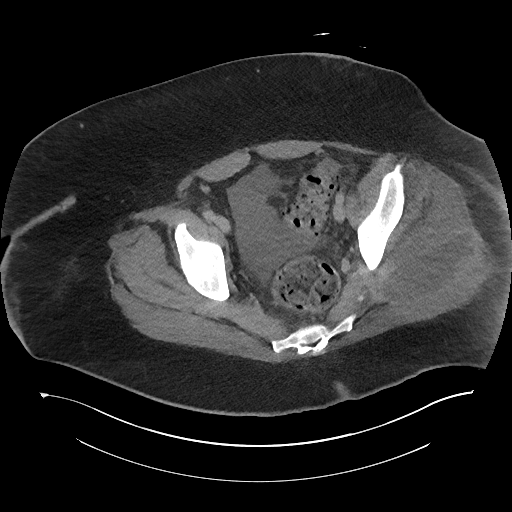
[im 72/98  soft-tissue]
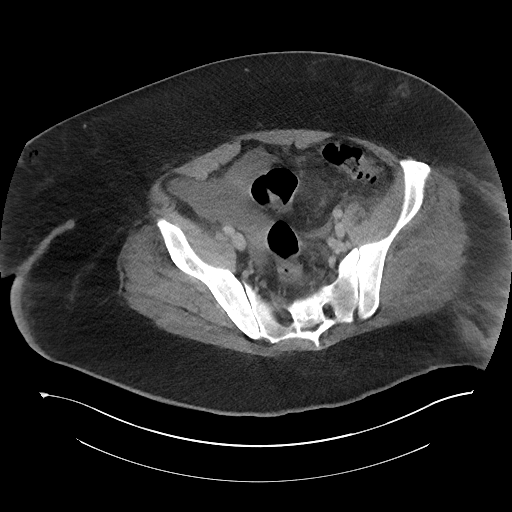
[im 79/98  soft-tissue]
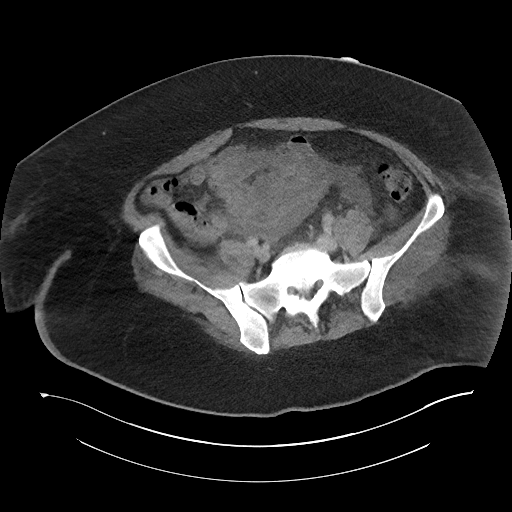
[im 85/98  soft-tissue]
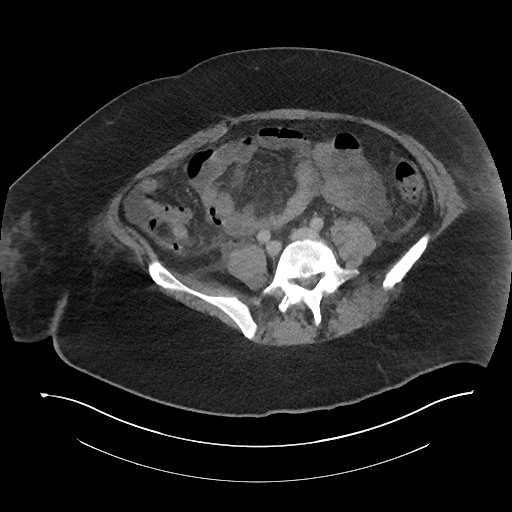
[im 91/98  soft-tissue]
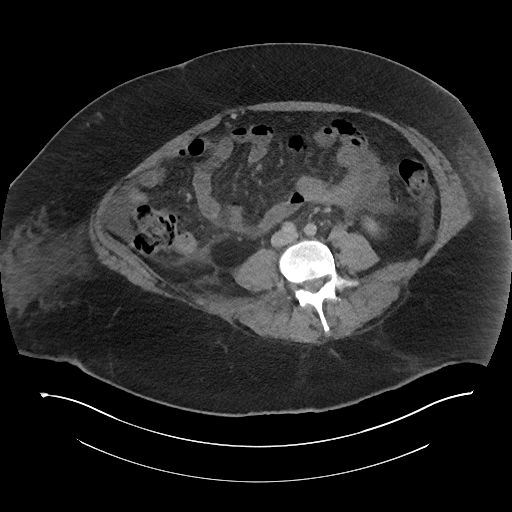

[Series 5: pelvis with 2.0 cor · coronal · 1.04mm/px · 3 of 190 slices shown]
[im 64/190  soft-tissue]
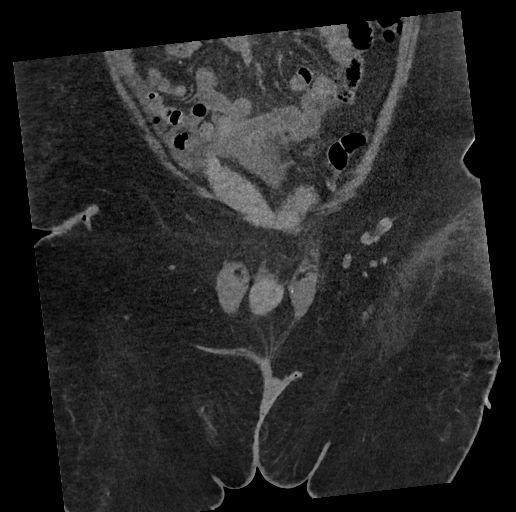
[im 85/190  soft-tissue]
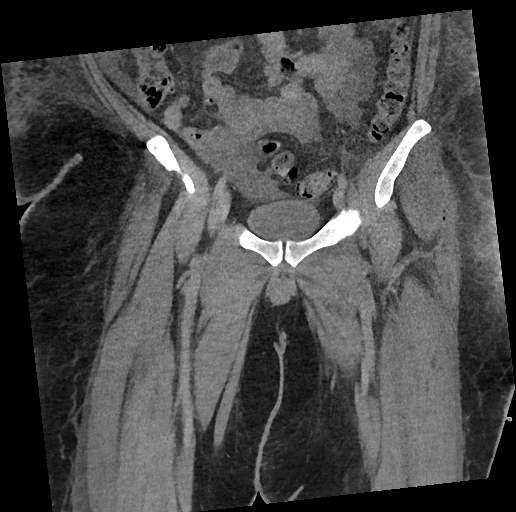
[im 106/190  soft-tissue]
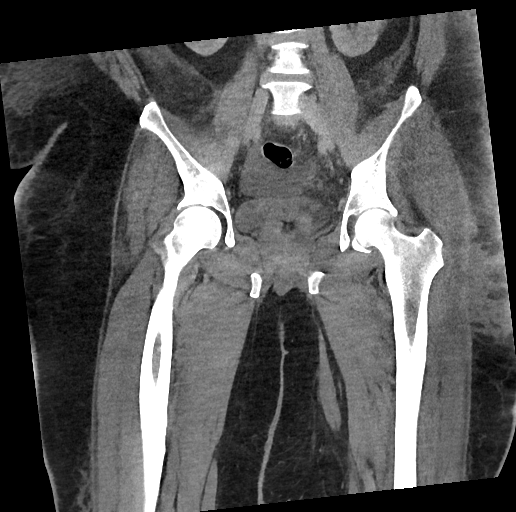

[17 of 46 positions shown; findings below may reference images not displayed]

FINDINGS: Urinary Tract: Normal appearing bladder, distal ureters and
visualized inferior poles of the kidneys

Bowel: Sigmoid diverticulosis. Prominent stool in rectum. Normal
appendix.

Vascular/Lymphatic: Vascular structures patent

Reproductive:  Normal prostate gland

Other: Ascites in pelvis. No free air. Scattered subcutaneous edema
at the flanks and proximal LEFT thigh laterally.

Musculoskeletal: Osseous structures unremarkable.

LEFT buttock/proximal LEFT lower extremity: Percutaneous pigtail
drainage catheter LEFT gluteal muscles. Bilobed residual abscess
collection identified at the LEFT gluteal muscles. Dominant
collection more posteriorly, containing the drainage catheter, 9.5 x
5.7 x 16.6 cm. Thinner collection extending anteriorly and
descending laterally at the upper LEFT leg, 7.8 x 1.9 cm in axial
dimensions and extending 15.5 cm length, containing tiny foci of
gas, appears to extend deep to muscular fascia. Overlying
subcutaneous infiltration. Intermuscular edema identified at the
posterior muscles of the LEFT thigh without discrete fluid
collection. No joint effusion.
IMPRESSION: Persistent large LEFT gluteal abscess collection 9.5 x 5.7 cm,
cm length, containing the pigtail drainage catheter.

Additional thinner contiguous abscess collection extending more
anteriorly and descending laterally into the lateral LEFT leg 7.8 x
1.9 in axial dimensions, 15.5 cm length, extends deep to muscular
fascia.

Additional intermuscular fluid at the LEFT hamstring muscles.

Scattered subcutaneous edema at the flanks and LEFT hip region.

Ascites.

## 2019-06-20 MED ORDER — IOHEXOL 300 MG/ML  SOLN
100.0000 mL | Freq: Once | INTRAMUSCULAR | Status: AC | PRN
  Administered 2019-06-20: 100 mL via INTRAVENOUS

## 2019-06-20 MED ORDER — CEFAZOLIN IV (FOR PTA / DISCHARGE USE ONLY): 2.0000 g | Freq: Three times a day (TID) | INTRAVENOUS | 0 refills | Status: DC

## 2019-06-20 NOTE — TOC Initial Note (Addendum)
Transition of Care [facility]) - Initial/Assessment Note    Patient Details  Name: Robert Sullivan MRN: [id number] Date of Birth: [birth year]  Transition of Care [facility]) CM/SW Contact:    Robert Favre, RN Phone Number: 06/20/2019, 2:47 PM  Clinical Narrative:                 [phone] Robert Sullivan with [facility] has declined referral for [facility] for IV antibiotics. Robert Sullivan with [facility] is meeting with patient and wife at 88 today and will provide [facility] information. Patient will have to set up a payment plan with Robert [facility] for [employer] [facility].    Patient for possible discharge over weekend with IV Ancef until Jul 11, 2019.   Referral made to [facility]. If Patient does not qualify for charity he will have to private pay for Robert Sullivan through Robert Sullivan.   Awaiting call back from Robert Sullivan with [facility].  Robert Sullivan with [facility] also following .  3 in 1 ordered.  Expected Discharge Plan: Robert Sullivan Barriers to Discharge: Continued Medical Work up   Patient Goals and CMS Choice Patient states their goals for this hospitalization and ongoing recovery are:: to go home CMS Medicare.gov Compare Post Acute Care list provided to:: Patient Choice offered to / list presented to : Patient  Expected Discharge Plan and Services Expected Discharge Plan: Robert Sullivan   Discharge Planning Services: CM Consult Post Acute Care Choice: Robert Sullivan arrangements for the past 2 months: Single Family Home                 DME Arranged: 3-N-1 DME Agency: Robert Sullivan Date DME Agency Contacted: 06/20/19 Time DME Agency Contacted: [phone] Representative spoke with at DME Agency: Robert Sullivan Arranged: RN          Prior Living Arrangements/Services Living arrangements for the past 2 months: [place] with:: Spouse Patient language and need for interpreter reviewed:: Yes Do you feel safe going back to the place where you live?: Yes      Need for Family  Participation in Patient Care: Yes (Comment) Care giver support system in place?: Yes (comment) Current home services: DME Criminal Activity/Legal Involvement Pertinent to Current Situation/Hospitalization: No - Comment as needed  Activities of Daily Living Home Assistive Devices/Equipment: Crutches, Eyeglasses, Walker (specify type) ADL Screening (condition at time of admission) Patient's cognitive ability adequate to safely complete daily activities?: Yes Is the patient deaf or have difficulty hearing?: No Does the patient have difficulty seeing, even when wearing glasses/contacts?: No Does the patient have difficulty concentrating, remembering, or making decisions?: No Patient able to express need for assistance with ADLs?: Yes Does the patient have difficulty dressing or bathing?: No Independently performs ADLs?: Yes (appropriate for developmental age) Does the patient have difficulty walking or climbing stairs?: Yes Weakness of Legs: Both Weakness of Arms/Hands: None  Permission Sought/Granted   Permission granted to share information with : Yes, Verbal Permission Granted     Permission granted to share info w AGENCY: [facility], and [facility], [facility]        Emotional Assessment Appearance:: Appears stated age Attitude/Demeanor/Rapport: Engaged Affect (typically observed): Accepting Orientation: : Oriented to Self, Oriented to Place, Oriented to  Time, Oriented to Situation Alcohol / Substance Use: Not Applicable Psych Involvement: No (comment)  Admission diagnosis:  Sepsis, due to unspecified organism, unspecified whether acute organ dysfunction present [facility]) [A41.9] Patient Active Problem List   Diagnosis Date Noted  . MSSA bacteremia  06/19/2019  . Uncontrolled diabetes mellitus ([place]) 06/10/2019  . Obesity, Class III, BMI 40-49.9 (morbid obesity) ([place]) 06/10/2019  . Cellulitis, gluteal, left 06/10/2019  . Sepsis secondary to UTI ([place]) 05/13/2019   . Acute lower UTI 05/13/2019  . Type 2 diabetes mellitus without complication ([place]) 94/17/9199  . Hip pain, acute, left 05/13/2019  . Sepsis ([place]) 05/13/2019  . Acute cystitis without hematuria   . Hyperglycemia    PCP:  Robert Corti, PA-C Pharmacy:   [id number]Phone: [phone]: [phone] [facility] [place] [address]Phone: [phone]: [phone]    Social Determinants of Health (SDOH) Interventions    Readmission Risk Interventions No flowsheet data found.

## 2019-06-20 NOTE — Progress Notes (Signed)
[place] for Infectious Disease    Date of Admission:  06/10/2019   Total days of antibiotics           ID: Robert Sullivan is a 53 y.o. male with  Disseminated MSSA bacteremia and massive deep gluteal abscess Principal Problem:   MSSA bacteremia Active Problems:   Acute lower UTI   Sepsis ([place])   Uncontrolled diabetes mellitus ([place])   Obesity, Class III, BMI 40-49.9 (morbid obesity) (HCC)   Cellulitis, gluteal, left    Subjective:  Afebrile. Rash to arms unchanged, non pruritic Medications:  . bisacodyl  10 mg Rectal Once  . Chlorhexidine Gluconate Cloth  6 each Topical Daily  . cholecalciferol  2,000 Units Oral Daily  . heparin  5,000 Units Subcutaneous Q8H  . insulin aspart  0-15 Units Subcutaneous TID WC  . insulin aspart  0-5 Units Subcutaneous QHS  . insulin aspart  8 Units Subcutaneous TID WC  . insulin glargine  25 Units Subcutaneous Daily  . loratadine  10 mg Oral Daily  . multivitamin with minerals  1 tablet Oral Daily  . Ensure Max Protein  11 oz Oral Daily  . sodium chloride flush  10-40 mL Intracatheter Q12H  . sodium chloride flush  5 mL Intracatheter Q8H  . vitamin C  1,000 mg Oral Daily    Objective: Vital signs in last 24 hours: Temp:  [97.9 F (36.6 C)-98.6 F (37 C)] 97.9 F (36.6 C) (12/11 9326) Pulse Rate:  [83-102] 89 (12/11 0833) Resp:  [12-19] 18 (12/11 0833) BP: (126-149)/(69-89) 130/69 (12/11 0833) SpO2:  [94 %-99 %] 96 % (12/11 0833) Weight:  [712 kg] 169 kg (12/11 0541)  Physical Exam  Constitutional: He is oriented to person, place, and time. He appears well-developed and well-nourished. No distress.  HENT:  Mouth/Throat: Oropharynx is clear and moist. No oropharyngeal exudate.  Cardiovascular: Normal rate, regular rhythm and normal heart sounds. Exam reveals no gallop and no friction rub.  No murmur heard.  Pulmonary/Chest: Effort normal and breath sounds normal. No respiratory distress. He has no wheezes.  Abdominal: Soft.  Bowel sounds are normal. He exhibits no distension. There is no tenderness.  Lymphadenopathy:  Ext: pitting edema to legs Skin: macular papular patch to arms by picc line dressing Psychiatric: He has a normal mood and affect. His behavior is normal.     Lab Results Recent Labs    06/18/19 0211 06/19/19 0430 06/20/19 0515  WBC 10.8* 10.2 10.1  HGB 12.2* 11.4* 11.2*  HCT 36.7* 34.4* 33.9*  NA 135 136  --   K 3.9 3.9  --   CL 101 104  --   CO2 24 24  --   BUN 15 14  --   CREATININE 0.55* 0.49*  --    Liver Panel Recent Labs    06/18/19 0211  PROT 6.6  ALBUMIN 1.3*  AST 82*  ALT 27  ALKPHOS 300*  BILITOT 1.3*    Microbiology: 12/2 blood cx ngtd 12.8 gluteal abscess MSSA Studies/Results: DG CHEST PORT 1 VIEW  Result Date: 06/19/2019 CLINICAL DATA:  PICC (peripherally inserted central catheter) in place EXAM: PORTABLE CHEST 1 VIEW COMPARISON:  None. FINDINGS: PICC line with tip in the distal SVC. Low lung volumes and elevation RIGHT hemidiaphragm. Lungs are clear. No pneumothorax. IMPRESSION: PICC line in good position. Electronically Signed   By: Suzy Bouchard M.D.   On: 06/19/2019 20:08   [place] EKG SITE RITE  Result Date: 06/19/2019 If Site  Rite image not attached, placement could not be confirmed due to current cardiac rhythm.    Assessment/Plan: Plan for 6 wks of IV cefazolin 2gm Q 8hr using 12.9.20 as day 1. Will need weekly labs for cbc, and bmp.  Rash = resolving likely related to sulfa. Recommend to do topical steroids to see if improvement  Will sign off and see back in clinic in 4-6 wk  Diagnosis: mssa disseminated infection  Culture Result: gluteal abscess and bacteremia  Allergies  Allergen Reactions  . Sulfa Antibiotics     Total body rash    OPAT Orders Discharge antibiotics: Per pharmacy protocol  Cefazolin 2gm Iv q8hr  Duration: 6 wk End Date: Jan 20th  [place] Per Protocol:  Labs weekly while on IV antibiotics: _x_ CBC with  differential _x_ BMP  __x CRP _x_ ESR  _x_ Please pull PIC at completion of IV antibiotics   Fax weekly labs to [phone]  Clinic Follow Up Appt: 4-6 wk  @ [place] for Infectious Diseases Cell: [phone] Pager: [phone]  06/20/2019, 9:26 AM

## 2019-06-20 NOTE — Progress Notes (Addendum)
PROGRESS NOTE    Robert Sullivan  ZOX:[id number] DOB: 07-05-1966 DOA: 06/10/2019 PCP: Cathleen Corti, PA-C    Brief Narrative:  53 y.o. male with history of uncontrolled type 2 diabetes mellitus, morbid obesity was admitted to [facility] from 11/2-11/7 with E. coli UTI, uncontrolled diabetes and a fall that resulted in a left gluteal hematoma, he was treated with IV antibiotics, discharged home on Bactrim, completed course and also on insulin. -He felt well initially after discharge, in the last 7 to 10 days he started having fevers and chills, profuse sweats, 5 to 6 days ago he started having increased pain and swelling at the site of his gluteal hematoma, he also noticed that his urine was darker but denied any dysuria or change in odor presented to the emergency room today with weakness, worsening left hip pain, fevers chills and sweats. -In the ED he was noted to have a sodium of 131, CBG of 380, albumin of 1.8, ALP of 224,  lactic acid of 5.5, white count of 20,000.  In the ED he was noted to have an elevated D-dimer subsequently EDP proceeded with CT angiogram which was negative for pulmonary embolism, his UA was significantly abnormal with cloudy urine, positive nitrite, leukocyte esterase, numerous WBC and bacteria.  In addition noted to have redness and tenderness of his left gluteal region  Assessment & Plan:   Principal Problem:   MSSA bacteremia Active Problems:   Acute lower UTI   Sepsis ([place])   Uncontrolled diabetes mellitus ([place])   Obesity, Class III, BMI 40-49.9 (morbid obesity) (HCC)   Cellulitis, gluteal, left  Sepsis secondary to MSSA bacteremia  Blood cx pos for MSSA, ID now consulted with recommendation for 2d echo to r/o endocarditis, neg findings  TEE performed 12/7 with no evidence of vegetations  currently on ancef per ID   Urinary tract infection  Continued on ancef per above  Presenting UA suggestive of UTI, however, pt denies dysuria or other  symptoms  Left gluteal myositis, abscess  Per above, pt is continued on ancef  Marked pain with findings suggestive of myositis on CT  f/u MRI findings per above  Pt reports symptoms seem to be improving. Pt better able to ambulate  Appreciate Orthopedics input.   Pt now s/p drain placement by IR 12/7.  Output currently approx 46m in 24 hours. Will recheck CT  Currently cultures are growing out staph aureus  WBC is improved to 10.2 this morning.  Repeat labs tomorrow   Morbid obesity  BMI of 47  diet/lifestyle modification recommended  Uncontrolled type 2 diabetes mellitus  Hemoglobin A1c was 11 recently  Resume Lantus and NovoLog premeal and sliding scale insulin  Glucose trends stable  Suspected cirrhosis  Noted on imaging, patient denies any alcohol use. Suspect NASH/fatty liver disease is likely the primary culprit  Hep C neg  GI follow up recommended on d/c  DVT prophylaxis: Heparin subQ Code Status: Full Family Communication: Pt in room, family not at bedside Disposition Plan: Uncertain at this time  Consultants:   ID  Cardiology  Orthopedic Surgery  IR  Procedures:   TEE 12/7  IR guided drain placement to L thigh 12/8  Antimicrobials: Anti-infectives (From admission, onward)   Start     Dose/Rate Route Frequency Ordered Stop   06/11/19 2200  ceFAZolin (ANCEF) IVPB 2g/100 mL premix     2 g 200 mL/hr over 30 Minutes Intravenous Every 8 hours 06/11/19 1456     06/11/19 0200  vancomycin (VANCOCIN) 1,750 mg in sodium chloride 0.9 % 500 mL IVPB  Status:  Discontinued     1,750 mg 250 mL/hr over 120 Minutes Intravenous Every 12 hours 06/10/19 1257 06/11/19 1456   06/10/19 2200  ceFEPIme (MAXIPIME) 2 g in sodium chloride 0.9 % 100 mL IVPB  Status:  Discontinued     2 g 200 mL/hr over 30 Minutes Intravenous Every 8 hours 06/10/19 1257 06/10/19 1740   06/10/19 1745  cefTRIAXone (ROCEPHIN) 1 g in sodium chloride 0.9 % 100 mL IVPB  Status:   Discontinued     1 g 200 mL/hr over 30 Minutes Intravenous Every 24 hours 06/10/19 1740 06/11/19 1456   06/10/19 1245  ceFEPIme (MAXIPIME) 2 g in sodium chloride 0.9 % 100 mL IVPB     2 g 200 mL/hr over 30 Minutes Intravenous  Once 06/10/19 1239 06/10/19 1411   06/10/19 1245  vancomycin (VANCOCIN) IVPB 1000 mg/200 mL premix  Status:  Discontinued     1,000 mg 200 mL/hr over 60 Minutes Intravenous  Once 06/10/19 1239 06/10/19 1241   06/10/19 1245  vancomycin (VANCOCIN) 2,500 mg in sodium chloride 0.9 % 500 mL IVPB     2,500 mg 250 mL/hr over 120 Minutes Intravenous  Once 06/10/19 1241 06/10/19 1635      Subjective: Doing well.  Patient ambulating with physical therapy and without shortness of breath or chest pain.  Currently having some edema in the left thigh.  Does have pain with ambulation but is finding himself more immobile.  Objective: Vitals:   06/19/19 1941 06/20/19 0034 06/20/19 0541 06/20/19 0833  BP: 126/73 128/89 129/79 130/69  Pulse: 98 92 83 89  Resp: 12 19 12 18   Temp: 98 F (36.7 C) 97.9 F (36.6 C) 98.3 F (36.8 C) 97.9 F (36.6 C)  TempSrc: Oral Oral Oral Oral  SpO2: 97% 97% 99% 96%  Weight:   (!) 169 kg   Height:        Intake/Output Summary (Last 24 hours) at 06/20/2019 1348 Last data filed at 06/20/2019 1333 Gross per 24 hour  Intake 1228.19 ml  Output 815 ml  Net 413.19 ml   Filed Weights   06/16/19 1117 06/17/19 2034 06/20/19 0541  Weight: (!) 170.2 kg (!) 170.3 kg (!) 169 kg    Examination: General exam: Awake, alert, no acute distress. Respiratory system: Good respiratory effort with no wheezes, rales, rhonchi Cardiovascular system: Regular rate.  Normal S1-S2 sounds. Gastrointestinal system: Soft, nontender, nondistended. Central nervous system: CN2-12 grossly intact, strength intact Extremities: Minimal edema.  Pulses normal. Skin: Drain in left buttock.  Skin around drain normal without erythema.  Petechial rash in right upper arm, as  well as in ankles bilaterally. Psychiatry: Mood normal // no visual hallucinations   Data Reviewed: I have personally reviewed following labs and imaging studies  CBC: Recent Labs  Lab 06/16/19 0512 06/17/19 0611 06/18/19 0211 06/19/19 0430 06/20/19 0515  WBC 14.2* 13.2* 10.8* 10.2 10.1  NEUTROABS  --   --   --   --  7.2  HGB 11.2* 12.3* 12.2* 11.4* 11.2*  HCT 32.8* 36.0* 36.7* 34.4* 33.9*  MCV 89.1 90.7 92.2 93.5 92.6  PLT 123* 135* 136* 143* 542*   Basic Metabolic Panel: Recent Labs  Lab 06/15/19 0545 06/16/19 0512 06/17/19 0611 06/18/19 0211 06/19/19 0430 06/20/19 0515  NA 129* 131* 134* 135 136  --   K 4.4 4.0 4.0 3.9 3.9  --   CL 100 100 101  101 104  --   CO2 22 23 22 24 24   --   GLUCOSE 195* 209* 173* 142* 133*  --   BUN 17 18 18 15 14   --   CREATININE 0.62 0.72 0.59* 0.55* 0.49*  --   CALCIUM 7.8* 7.9* 7.9* 8.0* 7.9*  --   MG  --   --   --   --   --  1.9  PHOS  --   --   --   --   --  3.2   GFR: Estimated Creatinine Clearance: 180.7 mL/min (A) (by C-G formula based on SCr of 0.49 mg/dL (L)). Liver Function Tests: Recent Labs  Lab 06/14/19 0442 06/15/19 0545 06/16/19 0512 06/17/19 0611 06/18/19 0211  AST 88* 82* 81* 80* 82*  ALT 32 29 28 27 27   ALKPHOS 282* 292* 299* 300* 300*  BILITOT 1.5* 1.4* 1.2 1.4* 1.3*  PROT 6.2* 5.7* 5.9* 6.4* 6.6  ALBUMIN 1.3* 1.2* 1.2* 1.3* 1.3*   No results for input(s): LIPASE, AMYLASE in the last 168 hours. No results for input(s): AMMONIA in the last 168 hours. Coagulation Profile: Recent Labs  Lab 06/16/19 1656  INR 1.4*   Cardiac Enzymes: No results for input(s): CKTOTAL, CKMB, CKMBINDEX, TROPONINI in the last 168 hours. BNP (last 3 results) No results for input(s): PROBNP in the last 8760 hours. HbA1C: No results for input(s): HGBA1C in the last 72 hours. CBG: Recent Labs  Lab 06/19/19 1109 06/19/19 1635 06/19/19 2039 06/20/19 0650 06/20/19 1125  GLUCAP 151* 187* 168* 134* 137*   Lipid  Profile: No results for input(s): CHOL, HDL, LDLCALC, TRIG, CHOLHDL, LDLDIRECT in the last 72 hours. Thyroid Function Tests: No results for input(s): TSH, T4TOTAL, FREET4, T3FREE, THYROIDAB in the last 72 hours. Anemia Panel: No results for input(s): VITAMINB12, FOLATE, FERRITIN, TIBC, IRON, RETICCTPCT in the last 72 hours. Sepsis Labs: No results for input(s): PROCALCITON, LATICACIDVEN in the last 168 hours.  Recent Results (from the past 240 hour(s))  SARS CORONAVIRUS 2 (TAT 6-24 HRS) Nasopharyngeal Nasopharyngeal Swab     Status: None   Collection Time: 06/10/19  6:40 PM   Specimen: Nasopharyngeal Swab  Result Value Ref Range Status   SARS Coronavirus 2 NEGATIVE NEGATIVE Final    Comment: (NOTE) SARS-CoV-2 target nucleic acids are NOT DETECTED. The SARS-CoV-2 RNA is generally detectable in upper and lower respiratory specimens during the acute phase of infection. Negative results do not preclude SARS-CoV-2 infection, do not rule out co-infections with other pathogens, and should not be used as the sole basis for treatment or other patient management decisions. Negative results must be combined with clinical observations, patient history, and epidemiological information. The expected result is Negative. Fact Sheet for Patients: SugarRoll.be Fact Sheet for Healthcare Providers: https://www.woods-mathews.com/ This test is not yet approved or cleared by the [place] FDA and  has been authorized for detection and/or diagnosis of SARS-CoV-2 by FDA under an Emergency Use Authorization (EUA). This EUA will remain  in effect (meaning this test can be used) for the duration of the COVID-19 declaration under Section 56 4(b)(1) of the Act, 21 U.S.C. section 360bbb-3(b)(1), unless the authorization is terminated or revoked sooner. Performed at [facility], [place] [address], [place], [place] [address]   Culture, blood (routine x 2)      Status: None   Collection Time: 06/11/19  4:35 PM   Specimen: BLOOD LEFT HAND  Result Value Ref Range Status   Specimen Description BLOOD LEFT HAND  Final   Special Requests   Final    BOTTLES DRAWN AEROBIC AND ANAEROBIC Blood Culture adequate volume   Culture   Final    NO GROWTH 5 DAYS Performed at [facility], [address], [place], [place] [address]    Report Status 06/16/2019 FINAL  Final  Culture, blood (routine x 2)     Status: None   Collection Time: 06/11/19  4:40 PM   Specimen: BLOOD RIGHT HAND  Result Value Ref Range Status   Specimen Description BLOOD RIGHT HAND  Final   Special Requests   Final    BOTTLES DRAWN AEROBIC ONLY Blood Culture adequate volume   Culture   Final    NO GROWTH 5 DAYS Performed at [facility], [place] [address] [place], [place] [address]    Report Status 06/16/2019 FINAL  Final  Aerobic/Anaerobic Culture (surgical/deep wound)     Status: None (Preliminary result)   Collection Time: 06/17/19 11:27 AM   Specimen: Abscess  Result Value Ref Range Status   Specimen Description ABSCESS LEFT GLUTEAL  Final   Special Requests Normal  Final   Gram Stain   Final    ABUNDANT WBC PRESENT,BOTH PMN AND MONONUCLEAR ABUNDANT GRAM POSITIVE COCCI Performed at [facility], [place] [address], [place], [place] [address]    Culture   Final    MODERATE STAPHYLOCOCCUS AUREUS NO ANAEROBES ISOLATED; CULTURE IN PROGRESS FOR 5 DAYS    Report Status PENDING  Incomplete   Organism ID, Bacteria STAPHYLOCOCCUS AUREUS  Final      Susceptibility   Staphylococcus aureus - MIC*    CIPROFLOXACIN <=0.5 SENSITIVE Sensitive     ERYTHROMYCIN <=0.25 SENSITIVE Sensitive     GENTAMICIN <=0.5 SENSITIVE Sensitive     OXACILLIN 0.5 SENSITIVE Sensitive     TETRACYCLINE >=16 RESISTANT Resistant     VANCOMYCIN 1 SENSITIVE Sensitive     TRIMETH/SULFA <=10 SENSITIVE Sensitive     CLINDAMYCIN <=0.25 SENSITIVE Sensitive     RIFAMPIN <=0.5 SENSITIVE Sensitive      Inducible Clindamycin NEGATIVE Sensitive     * MODERATE STAPHYLOCOCCUS AUREUS     Radiology Studies: DG CHEST PORT 1 VIEW  Result Date: 06/19/2019 CLINICAL DATA:  PICC (peripherally inserted central catheter) in place EXAM: PORTABLE CHEST 1 VIEW COMPARISON:  None. FINDINGS: PICC line with tip in the distal SVC. Low lung volumes and elevation RIGHT hemidiaphragm. Lungs are clear. No pneumothorax. IMPRESSION: PICC line in good position. Electronically Signed   By: Suzy Bouchard M.D.   On: 06/19/2019 20:08   [place] EKG SITE RITE  Result Date: 06/19/2019 If Site Rite image not attached, placement could not be confirmed due to current cardiac rhythm.   Scheduled Meds: . bisacodyl  10 mg Rectal Once  . Chlorhexidine Gluconate Cloth  6 each Topical Daily  . cholecalciferol  2,000 Units Oral Daily  . heparin  5,000 Units Subcutaneous Q8H  . insulin aspart  0-15 Units Subcutaneous TID WC  . insulin aspart  0-5 Units Subcutaneous QHS  . insulin aspart  8 Units Subcutaneous TID WC  . insulin glargine  25 Units Subcutaneous Daily  . loratadine  10 mg Oral Daily  . multivitamin with minerals  1 tablet Oral Daily  . Ensure Max Protein  11 oz Oral Daily  . sodium chloride flush  10-40 mL Intracatheter Q12H  . sodium chloride flush  5 mL Intracatheter Q8H  . vitamin C  1,000 mg Oral Daily  Continuous Infusions: . sodium chloride 500 mL (06/17/19 0545)  .  ceFAZolin (ANCEF) IV 2 g (06/20/19 1317)     LOS: 10 days   Truett Mainland, DO [facility] Pager On Amion  If 7PM-7AM, please contact night-coverage 06/20/2019, 1:48 PM

## 2019-06-20 NOTE — Progress Notes (Addendum)
This RN was notified by CCMD that patient is running 4 beats of non sustained Vtach. reviewed tele monitor. Pt. has frequent runs of non sustained Vtach since PICC Line was placed in the afternoon. Went to checked the patient. Patient state this is the first time he is having this feeling of being anxious without reason, palpitation and his heart feels hollow. Text paged NP [facility] and made aware. Orders were given. Will continue to monitor patient.

## 2019-06-20 NOTE — Plan of Care (Signed)
  Problem: Clinical Measurements: Goal: Ability to maintain clinical measurements within normal limits will improve Outcome: Progressing   

## 2019-06-20 NOTE — Progress Notes (Signed)
Physical Therapy Treatment Patient Details Name: Robert Sullivan MRN: [id number] DOB: [birth year] Today's Date: 06/20/2019    History of Present Illness Pt is a 53 y/o M admitted on 06/10/19 for fevers & chills & increased swelling at hematoma with pt found to have L gluteal abscess & MSSA bacetermia.  P t previously admitted to [facility] on 11/2-11/7 for E. Coli UTI, uncontrolled diabetes, & fall that resulted in a L gluteal hematoma, was treated with IV antibiotics & d/c home. Pt underwent TEE on 06/16/19 that showed no evidence of endocarditis. PMH significant for morbid obesity, uncontrolled DM2, & arthritis in B knees,    PT Comments    Patient received in bed, agrees to PT at this time. Patient verbalized frustrations with stay, listened and offered support. Patient performed bed mobility with heavy use of rails and supervision. Limited by pain. He is able to stand with bed height elevated and min guard. He ambulated with RW to door and back, min guard. Mod fatigue, heavy breathing and reports light-headed at end of walk. Patient will continue to benefit from skilled PT while here to improve mobility and endurance for safe return home.     Follow Up Recommendations  Home health PT     Equipment Recommendations  Other (comment)(bariatric BSC)    Recommendations for Other Services       Precautions / Restrictions Precautions Precautions: Fall Precaution Comments: drain at L gluteal abscess Restrictions Weight Bearing Restrictions: No    Mobility  Bed Mobility Overal bed mobility: Needs Assistance Bed Mobility: Supine to Sit;Sit to Supine     Supine to sit: Supervision Sit to supine: Supervision   General bed mobility comments: heavy use of bed rails, significantly extra time for supine<>sit, pt requires cuing to scoot to EOB and get BLE feet flat on floor, pt limited by pain in L hip  Transfers Overall transfer level: Needs assistance Equipment used: Rolling walker (2  wheeled) Transfers: Sit to/from Stand Sit to Stand: From elevated surface;Min guard            Ambulation/Gait Ambulation/Gait assistance: Min guard Gait Distance (Feet): 25 Feet Assistive device: Rolling walker (2 wheeled) Gait Pattern/deviations: Step-through pattern;Decreased stride length;Decreased weight shift to left Gait velocity: reduced   General Gait Details: fatigued with gait, reports some lightheadedness at end of ambulation, requesting to sit down.   Stairs             Wheelchair Mobility    Modified Rankin (Stroke Patients Only)       Balance Overall balance assessment: Needs assistance Sitting-balance support: Feet supported Sitting balance-Leahy Scale: Good     Standing balance support: Bilateral upper extremity supported;During functional activity Standing balance-Leahy Scale: Fair                              Cognition Arousal/Alertness: Awake/alert Behavior During Therapy: WFL for tasks assessed/performed Overall Cognitive Status: Within Functional Limits for tasks assessed                                        Exercises      General Comments        Pertinent Vitals/Pain Pain Assessment: Faces Faces Pain Scale: Hurts even more Pain Location: L hip Pain Descriptors / Indicators: Aching;Sore;Tightness Pain Intervention(s): Monitored during session;Repositioned    Home Living  Prior Function            PT Goals (current goals can now be found in the care plan section) Acute Rehab PT Goals Patient Stated Goal: less pain PT Goal Formulation: With patient Time For Goal Achievement: 06/25/19 Potential to Achieve Goals: Good Progress towards PT goals: Progressing toward goals    Frequency    Min 2X/week      PT Plan      Co-evaluation              AM-PAC PT "6 Clicks" Mobility   Outcome Measure  Help needed turning from your back to your side while  in a flat bed without using bedrails?: A Little Help needed moving from lying on your back to sitting on the side of a flat bed without using bedrails?: A Little Help needed moving to and from a bed to a chair (including a wheelchair)?: A Little Help needed standing up from a chair using your arms (e.g., wheelchair or bedside chair)?: A Little Help needed to walk in hospital room?: A Little Help needed climbing 3-5 steps with a railing? : A Lot 6 Click Score: 17    End of Session       Nurse Communication: Other (comment)(patient declines bed exit alarm) PT Visit Diagnosis: Other abnormalities of gait and mobility (R26.89);Muscle weakness (generalized) (M62.81);Difficulty in walking, not elsewhere classified (R26.2);Pain Pain - Right/Left: Left Pain - part of body: Hip     Time: 1210-1240 PT Time Calculation (min) (ACUTE ONLY): 30 min  Charges:  $Gait Training: 8-22 mins $Therapeutic Activity: 8-22 mins                     Suhey Radford, PT, GCS 06/20/19,12:53 PM

## 2019-06-20 NOTE — Progress Notes (Signed)
PT Cancellation Note  Patient Details Name: Robert Sullivan MRN: [id number] DOB: [birth year]   Cancelled Treatment:    Reason Eval/Treat Not Completed: Patient declined, no reason specified. Sleeping upon arrival, reports he gets up in room by himself. Declining to go so at this time. I will check back later.   Jasher Barkan 06/20/2019, 11:20 AM

## 2019-06-20 NOTE — Plan of Care (Signed)
  Problem: Education: Goal: Knowledge of General Education information will improve Description Including pain rating scale, medication(s)/side effects and non-pharmacologic comfort measures Outcome: Progressing   

## 2019-06-20 NOTE — Progress Notes (Signed)
PHARMACY CONSULT NOTE FOR:  OUTPATIENT  PARENTERAL ANTIBIOTIC THERAPY (OPAT)  Indication: MSSA bacteremia/gluteal abscess Regimen: Cefazolin 2 gm q 8 hours End date: 07/29/2019  IV antibiotic discharge orders are pended. To discharging provider:  please sign these orders via discharge navigator,  Select New Orders & click on the button choice - Manage This Unsigned Work.     Thank you for allowing pharmacy to be a part of this patient's care.  Jimmy Footman, PharmD, BCPS, [place] [facility] Clinical Pharmacist Phone: [phone] 06/20/2019, 10:21 AM

## 2019-06-21 LAB — CBC
HCT: 34.8 % — ABNORMAL LOW (ref 39.0–52.0)
Hemoglobin: 11.3 g/dL — ABNORMAL LOW (ref 13.0–17.0)
MCH: 31 pg (ref 26.0–34.0)
MCHC: 32.5 g/dL (ref 30.0–36.0)
MCV: 95.3 fL (ref 80.0–100.0)
Platelets: 155 10*3/uL (ref 150–400)
RBC: 3.65 MIL/uL — ABNORMAL LOW (ref 4.22–5.81)
RDW: 15.6 % — ABNORMAL HIGH (ref 11.5–15.5)
WBC: 10.5 10*3/uL (ref 4.0–10.5)
nRBC: 0 % (ref 0.0–0.2)

## 2019-06-21 LAB — GLUCOSE, CAPILLARY
Glucose-Capillary: 140 mg/dL — ABNORMAL HIGH (ref 70–99)
Glucose-Capillary: 168 mg/dL — ABNORMAL HIGH (ref 70–99)
Glucose-Capillary: 192 mg/dL — ABNORMAL HIGH (ref 70–99)
Glucose-Capillary: 145 mg/dL — ABNORMAL HIGH (ref 70–99)

## 2019-06-21 LAB — BASIC METABOLIC PANEL
Anion gap: 8 (ref 5–15)
BUN: 14 mg/dL (ref 6–20)
CO2: 25 mmol/L (ref 22–32)
Calcium: 7.9 mg/dL — ABNORMAL LOW (ref 8.9–10.3)
Chloride: 103 mmol/L (ref 98–111)
Creatinine, Ser: 0.51 mg/dL — ABNORMAL LOW (ref 0.61–1.24)
GFR calc Af Amer: >60 mL/min (ref >60)
GFR calc non Af Amer: >60 mL/min (ref >60)
Glucose, Bld: 139 mg/dL — ABNORMAL HIGH (ref 70–99)
Potassium: 4.4 mmol/L (ref 3.5–5.1)
Sodium: 136 mmol/L (ref 135–145)

## 2019-06-21 MED ORDER — BISMUTH SUBSALICYLATE 262 MG/15ML PO SUSP
30.0000 mL | ORAL | Status: DC | PRN
  Administered 2019-06-21 – 2019-06-28 (×7): 30 mL via ORAL
  Filled 2019-06-21 (×2): qty 236

## 2019-06-21 NOTE — Progress Notes (Signed)
Weekend [place] by primary service re repeat imaging results for patient's left hip after drainage stalled. Drain initially placed for MRI which had demonstrated "large multilocular septated cystic mass" in patient's left gluteal region. Yesterday's CT does not demonstrate resolution rather persistence and extension.  Patient has no fever or increase in WBC to suggest sepsis and clinically stable, as well, with no change in examination per Dr. Nehemiah Settle. Had actually discussed potential discharge to home.  Will let Dr. Percell Miller know. Surgical incision and drainage would be anticipated Mon or Tue.   Robert Magnolia, MD [facility], [facility] [phone]

## 2019-06-21 NOTE — Progress Notes (Addendum)
PROGRESS NOTE    Robert Sullivan  KZL:[id number] DOB: February 16, 1966 DOA: 06/10/2019 PCP: Cathleen Corti, PA-C    Brief Narrative:  53 y.o. male with history of uncontrolled type 2 diabetes mellitus, morbid obesity was admitted to [facility] from 11/2-11/7 with E. coli UTI, uncontrolled diabetes and a fall that resulted in a left gluteal hematoma, he was treated with IV antibiotics, discharged home on Bactrim, completed course and also on insulin. -He felt well initially after discharge, in the last 7 to 10 days he started having fevers and chills, profuse sweats, 5 to 6 days ago he started having increased pain and swelling at the site of his gluteal hematoma, he also noticed that his urine was darker but denied any dysuria or change in odor presented to the emergency room today with weakness, worsening left hip pain, fevers chills and sweats. -In the ED he was noted to have a sodium of 131, CBG of 380, albumin of 1.8, ALP of 224,  lactic acid of 5.5, white count of 20,000.  In the ED he was noted to have an elevated D-dimer subsequently EDP proceeded with CT angiogram which was negative for pulmonary embolism, his UA was significantly abnormal with cloudy urine, positive nitrite, leukocyte esterase, numerous WBC and bacteria.  In addition noted to have redness and tenderness of his left gluteal region  Assessment & Plan:   Principal Problem:   MSSA bacteremia Active Problems:   Acute lower UTI   Sepsis ([place])   Uncontrolled diabetes mellitus ([place])   Obesity, Class III, BMI 40-49.9 (morbid obesity) (HCC)   Cellulitis, gluteal, left  Sepsis secondary to MSSA bacteremia  Blood cx pos for MSSA, ID now consulted with recommendation for 2d echo to r/o endocarditis, neg findings  TEE performed 12/7 with no evidence of vegetations  currently on ancef    Urinary tract infection  Continued on ancef per above  Presenting UA suggestive of UTI, however, pt denies dysuria or other symptoms  Left  gluteal myositis, abscess - MSSA  Per above, pt is continued on ancef  Marked pain with findings suggestive of myositis on CT  f/u MRI findings per above  Pt reports symptoms seem to be improving. Pt better able to ambulate  Appreciate Orthopedics input.   Pt now s/p drain placement by IR 12/7.  Output currently approx 75m in 24 hours.  WBC stable  Rpt CT shows residual abscess measuring 9.5 x 5.7 x 16.6cm with extension anteriorly measuring 7.8 x 1.9 x 15.5cm, extending to muscular fascia.  Appreciate ortho seeing patient. If becomes unstable overnight, will do I&D tomorrow, otherwise wait until Monday.  Hold heparin and place SCDs   Morbid obesity  BMI of 47  diet/lifestyle modification recommended  Uncontrolled type 2 diabetes mellitus  Hemoglobin A1c was 11 recently  Resume Lantus and NovoLog premeal and sliding scale insulin  Glucose trends stable  Suspected cirrhosis  Noted on imaging, patient denies any alcohol use. Suspect NASH/fatty liver disease is likely the primary culprit  Hep C neg  GI follow up recommended on d/c  DVT prophylaxis: SCDs Code Status: Full Family Communication: Pt in room, family not at bedside Disposition Plan: Uncertain at this time  Consultants:   ID  Cardiology  Orthopedic Surgery  IR  Procedures:   TEE 12/7  IR guided drain placement to L thigh 12/8  Antimicrobials: Anti-infectives (From admission, onward)   Start     Dose/Rate Route Frequency Ordered Stop   06/20/19 0000  ceFAZolin (ANCEF)  IVPB     2 g Intravenous Every 8 hours 06/20/19 1701 07/29/19 2359   06/11/19 2200  ceFAZolin (ANCEF) IVPB 2g/100 mL premix     2 g 200 mL/hr over 30 Minutes Intravenous Every 8 hours 06/11/19 1456     06/11/19 0200  vancomycin (VANCOCIN) 1,750 mg in sodium chloride 0.9 % 500 mL IVPB  Status:  Discontinued     1,750 mg 250 mL/hr over 120 Minutes Intravenous Every 12 hours 06/10/19 1257 06/11/19 1456   06/10/19 2200   ceFEPIme (MAXIPIME) 2 g in sodium chloride 0.9 % 100 mL IVPB  Status:  Discontinued     2 g 200 mL/hr over 30 Minutes Intravenous Every 8 hours 06/10/19 1257 06/10/19 1740   06/10/19 1745  cefTRIAXone (ROCEPHIN) 1 g in sodium chloride 0.9 % 100 mL IVPB  Status:  Discontinued     1 g 200 mL/hr over 30 Minutes Intravenous Every 24 hours 06/10/19 1740 06/11/19 1456   06/10/19 1245  ceFEPIme (MAXIPIME) 2 g in sodium chloride 0.9 % 100 mL IVPB     2 g 200 mL/hr over 30 Minutes Intravenous  Once 06/10/19 1239 06/10/19 1411   06/10/19 1245  vancomycin (VANCOCIN) IVPB 1000 mg/200 mL premix  Status:  Discontinued     1,000 mg 200 mL/hr over 60 Minutes Intravenous  Once 06/10/19 1239 06/10/19 1241   06/10/19 1245  vancomycin (VANCOCIN) 2,500 mg in sodium chloride 0.9 % 500 mL IVPB     2,500 mg 250 mL/hr over 120 Minutes Intravenous  Once 06/10/19 1241 06/10/19 1635      Subjective: Doing well.  Patient ambulating with physical therapy and without shortness of breath or chest pain.  Patient frustrated with hematoma and that the abscess has not improved.  Objective: Vitals:   06/20/19 1750 06/20/19 2132 06/21/19 0544 06/21/19 0853  BP: 116/67 135/71 128/81 (!) 141/75  Pulse: 99 93 (!) 102 (!) 105  Resp: 20 18 18 18   Temp: 98.5 F (36.9 C) 98.4 F (36.9 C) 98.2 F (36.8 C) 98 F (36.7 C)  TempSrc: Oral Oral Oral Oral  SpO2: 97% 96% 93% 94%  Weight:      Height:        Intake/Output Summary (Last 24 hours) at 06/21/2019 1235 Last data filed at 06/21/2019 0900 Gross per 24 hour  Intake 1690 ml  Output 220 ml  Net 1470 ml   Filed Weights   06/16/19 1117 06/17/19 2034 06/20/19 0541  Weight: (!) 170.2 kg (!) 170.3 kg (!) 169 kg    Examination: General exam: Awake, alert, no acute distress. Respiratory system: Good respiratory effort with no wheezes, rales, rhonchi Cardiovascular system: Regular rate.  Normal S1-S2 sounds. Gastrointestinal system: Soft, nontender,  nondistended. Central nervous system: CN2-12 grossly intact, strength intact Extremities: Minimal edema.  Pulses normal. Skin: Drain in left buttock.  Skin around drain normal without erythema.  Petechial rash in right upper arm appears to be improving.   Psychiatry: Mood normal // no visual hallucinations   Data Reviewed: I have personally reviewed following labs and imaging studies  CBC: Recent Labs  Lab 06/17/19 0611 06/18/19 0211 06/19/19 0430 06/20/19 0515 06/21/19 0327  WBC 13.2* 10.8* 10.2 10.1 10.5  NEUTROABS  --   --   --  7.2  --   HGB 12.3* 12.2* 11.4* 11.2* 11.3*  HCT 36.0* 36.7* 34.4* 33.9* 34.8*  MCV 90.7 92.2 93.5 92.6 95.3  PLT 135* 136* 143* 143* 030   Basic Metabolic  Panel: Recent Labs  Lab 06/16/19 0512 06/17/19 0611 06/18/19 0211 06/19/19 0430 06/20/19 0515 06/21/19 0327  NA 131* 134* 135 136  --  136  K 4.0 4.0 3.9 3.9  --  4.4  CL 100 101 101 104  --  103  CO2 23 22 24 24   --  25  GLUCOSE 209* 173* 142* 133*  --  139*  BUN 18 18 15 14   --  14  CREATININE 0.72 0.59* 0.55* 0.49*  --  0.51*  CALCIUM 7.9* 7.9* 8.0* 7.9*  --  7.9*  MG  --   --   --   --  1.9  --   PHOS  --   --   --   --  3.2  --    GFR: Estimated Creatinine Clearance: 180.7 mL/min (A) (by C-G formula based on SCr of 0.51 mg/dL (L)). Liver Function Tests: Recent Labs  Lab 06/15/19 0545 06/16/19 0512 06/17/19 0611 06/18/19 0211  AST 82* 81* 80* 82*  ALT 29 28 27 27   ALKPHOS 292* 299* 300* 300*  BILITOT 1.4* 1.2 1.4* 1.3*  PROT 5.7* 5.9* 6.4* 6.6  ALBUMIN 1.2* 1.2* 1.3* 1.3*   No results for input(s): LIPASE, AMYLASE in the last 168 hours. No results for input(s): AMMONIA in the last 168 hours. Coagulation Profile: Recent Labs  Lab 06/16/19 1656  INR 1.4*   Cardiac Enzymes: No results for input(s): CKTOTAL, CKMB, CKMBINDEX, TROPONINI in the last 168 hours. BNP (last 3 results) No results for input(s): PROBNP in the last 8760 hours. HbA1C: No results for input(s):  HGBA1C in the last 72 hours. CBG: Recent Labs  Lab 06/20/19 1125 06/20/19 1622 06/20/19 2132 06/21/19 0654 06/21/19 1113  GLUCAP 137* 186* 149* 140* 168*   Lipid Profile: No results for input(s): CHOL, HDL, LDLCALC, TRIG, CHOLHDL, LDLDIRECT in the last 72 hours. Thyroid Function Tests: No results for input(s): TSH, T4TOTAL, FREET4, T3FREE, THYROIDAB in the last 72 hours. Anemia Panel: No results for input(s): VITAMINB12, FOLATE, FERRITIN, TIBC, IRON, RETICCTPCT in the last 72 hours. Sepsis Labs: No results for input(s): PROCALCITON, LATICACIDVEN in the last 168 hours.  Recent Results (from the past 240 hour(s))  Culture, blood (routine x 2)     Status: None   Collection Time: 06/11/19  4:35 PM   Specimen: BLOOD LEFT HAND  Result Value Ref Range Status   Specimen Description BLOOD LEFT HAND  Final   Special Requests   Final    BOTTLES DRAWN AEROBIC AND ANAEROBIC Blood Culture adequate volume   Culture   Final    NO GROWTH 5 DAYS Performed at [facility], [address], [place], [place] [address]    Report Status 06/16/2019 FINAL  Final  Culture, blood (routine x 2)     Status: None   Collection Time: 06/11/19  4:40 PM   Specimen: BLOOD RIGHT HAND  Result Value Ref Range Status   Specimen Description BLOOD RIGHT HAND  Final   Special Requests   Final    BOTTLES DRAWN AEROBIC ONLY Blood Culture adequate volume   Culture   Final    NO GROWTH 5 DAYS Performed at [facility], [place] [address] [place], [place] [address]    Report Status 06/16/2019 FINAL  Final  Aerobic/Anaerobic Culture (surgical/deep wound)     Status: None (Preliminary result)   Collection Time: 06/17/19 11:27 AM   Specimen: Abscess  Result Value Ref Range Status   Specimen Description ABSCESS  LEFT GLUTEAL  Final   Special Requests Normal  Final   Gram Stain   Final    ABUNDANT WBC PRESENT,BOTH PMN AND MONONUCLEAR ABUNDANT GRAM POSITIVE COCCI Performed at [facility], [place]  [address] [place], [place] [address]    Culture   Final    MODERATE STAPHYLOCOCCUS AUREUS NO ANAEROBES ISOLATED; CULTURE IN PROGRESS FOR 5 DAYS    Report Status PENDING  Incomplete   Organism ID, Bacteria STAPHYLOCOCCUS AUREUS  Final      Susceptibility   Staphylococcus aureus - MIC*    CIPROFLOXACIN <=0.5 SENSITIVE Sensitive     ERYTHROMYCIN <=0.25 SENSITIVE Sensitive     GENTAMICIN <=0.5 SENSITIVE Sensitive     OXACILLIN 0.5 SENSITIVE Sensitive     TETRACYCLINE >=16 RESISTANT Resistant     VANCOMYCIN 1 SENSITIVE Sensitive     TRIMETH/SULFA <=10 SENSITIVE Sensitive     CLINDAMYCIN <=0.25 SENSITIVE Sensitive     RIFAMPIN <=0.5 SENSITIVE Sensitive     Inducible Clindamycin NEGATIVE Sensitive     * MODERATE STAPHYLOCOCCUS AUREUS     Radiology Studies: CT PELVIS W CONTRAST  Result Date: 06/20/2019 CLINICAL DATA:  Abscess, LEFT leg swelling, abscess drainage, diabetes mellitus EXAM: CT PELVIS WITH CONTRAST TECHNIQUE: Multidetector CT imaging of the pelvis was performed using the standard protocol following the bolus administration of intravenous contrast. Sagittal and coronal MPR images reconstructed from axial data set. CONTRAST:  158m OMNIPAQUE IOHEXOL 300 MG/ML  SOLN IV COMPARISON:  06/10/2019 FINDINGS: Urinary Tract: Normal appearing bladder, distal ureters and visualized inferior poles of the kidneys Bowel: Sigmoid diverticulosis. Prominent stool in rectum. Normal appendix. Vascular/Lymphatic: Vascular structures patent Reproductive:  Normal prostate gland Other: Ascites in pelvis. No free air. Scattered subcutaneous edema at the flanks and proximal LEFT thigh laterally. Musculoskeletal: Osseous structures unremarkable. LEFT buttock/proximal LEFT lower extremity: Percutaneous pigtail drainage catheter LEFT gluteal muscles. Bilobed residual abscess collection identified at the LEFT gluteal muscles. Dominant collection more posteriorly, containing the drainage catheter, 9.5 x 5.7 x 16.6 cm.  Thinner collection extending anteriorly and descending laterally at the upper LEFT leg, 7.8 x 1.9 cm in axial dimensions and extending 15.5 cm length, containing tiny foci of gas, appears to extend deep to muscular fascia. Overlying subcutaneous infiltration. Intermuscular edema identified at the posterior muscles of the LEFT thigh without discrete fluid collection. No joint effusion. IMPRESSION: Persistent large LEFT gluteal abscess collection 9.5 x 5.7 cm, 16.6 cm length, containing the pigtail drainage catheter. Additional thinner contiguous abscess collection extending more anteriorly and descending laterally into the lateral LEFT leg 7.8 x 1.9 in axial dimensions, 15.5 cm length, extends deep to muscular fascia. Additional intermuscular fluid at the LEFT hamstring muscles. Scattered subcutaneous edema at the flanks and LEFT hip region. Ascites. Electronically Signed   By: MLavonia DanaM.D.   On: 06/20/2019 17:25   DG CHEST PORT 1 VIEW  Result Date: 06/19/2019 CLINICAL DATA:  PICC (peripherally inserted central catheter) in place EXAM: PORTABLE CHEST 1 VIEW COMPARISON:  None. FINDINGS: PICC line with tip in the distal SVC. Low lung volumes and elevation RIGHT hemidiaphragm. Lungs are clear. No pneumothorax. IMPRESSION: PICC line in good position. Electronically Signed   By: SSuzy BouchardM.D.   On: 06/19/2019 20:08    Scheduled Meds: . bisacodyl  10 mg Rectal Once  . Chlorhexidine Gluconate Cloth  6 each Topical Daily  . cholecalciferol  2,000 Units Oral Daily  . heparin  5,000 Units Subcutaneous Q8H  . insulin aspart  0-15 Units Subcutaneous TID WC  . insulin aspart  0-5 Units Subcutaneous QHS  . insulin aspart  8 Units Subcutaneous TID WC  . insulin glargine  25 Units Subcutaneous Daily  . loratadine  10 mg Oral Daily  . multivitamin with minerals  1 tablet Oral Daily  . Ensure Max Protein  11 oz Oral Daily  . sodium chloride flush  10-40 mL Intracatheter Q12H  . sodium chloride flush  5  mL Intracatheter Q8H  . vitamin C  1,000 mg Oral Daily   Continuous Infusions: . sodium chloride 500 mL (06/17/19 0545)  .  ceFAZolin (ANCEF) IV 2 g (06/21/19 0503)     LOS: 11 days   Truett Mainland, DO [facility] Pager On Amion  If 7PM-7AM, please contact night-coverage 06/21/2019, 12:35 PM

## 2019-06-22 LAB — AEROBIC/ANAEROBIC CULTURE W GRAM STAIN (SURGICAL/DEEP WOUND): Special Requests: NORMAL

## 2019-06-22 LAB — GLUCOSE, CAPILLARY
Glucose-Capillary: 137 mg/dL — ABNORMAL HIGH (ref 70–99)
Glucose-Capillary: 134 mg/dL — ABNORMAL HIGH (ref 70–99)
Glucose-Capillary: 156 mg/dL — ABNORMAL HIGH (ref 70–99)
Glucose-Capillary: 113 mg/dL — ABNORMAL HIGH (ref 70–99)

## 2019-06-22 LAB — CBC
HCT: 31.5 % — ABNORMAL LOW (ref 39.0–52.0)
Hemoglobin: 10.6 g/dL — ABNORMAL LOW (ref 13.0–17.0)
MCH: 31.5 pg (ref 26.0–34.0)
MCHC: 33.7 g/dL (ref 30.0–36.0)
MCV: 93.5 fL (ref 80.0–100.0)
Platelets: 133 10*3/uL — ABNORMAL LOW (ref 150–400)
RBC: 3.37 MIL/uL — ABNORMAL LOW (ref 4.22–5.81)
RDW: 15.8 % — ABNORMAL HIGH (ref 11.5–15.5)
WBC: 8.5 10*3/uL (ref 4.0–10.5)
nRBC: 0 % (ref 0.0–0.2)

## 2019-06-22 LAB — BASIC METABOLIC PANEL
Anion gap: 7 (ref 5–15)
BUN: 12 mg/dL (ref 6–20)
CO2: 26 mmol/L (ref 22–32)
Calcium: 8 mg/dL — ABNORMAL LOW (ref 8.9–10.3)
Chloride: 104 mmol/L (ref 98–111)
Creatinine, Ser: 0.48 mg/dL — ABNORMAL LOW (ref 0.61–1.24)
GFR calc Af Amer: >60 mL/min (ref >60)
GFR calc non Af Amer: >60 mL/min (ref >60)
Glucose, Bld: 130 mg/dL — ABNORMAL HIGH (ref 70–99)
Potassium: 4.1 mmol/L (ref 3.5–5.1)
Sodium: 137 mmol/L (ref 135–145)

## 2019-06-22 LAB — PROTIME-INR
INR: 1.3 — ABNORMAL HIGH (ref 0.8–1.2)
Prothrombin Time: 16.3 seconds — ABNORMAL HIGH (ref 11.4–15.2)

## 2019-06-22 MED ORDER — INSULIN GLARGINE 100 UNIT/ML ~~LOC~~ SOLN
12.0000 [IU] | Freq: Every day | SUBCUTANEOUS | Status: DC
  Administered 2019-06-22 – 2019-06-23 (×2): 12 [IU] via SUBCUTANEOUS
  Filled 2019-06-22 (×3): qty 0.12

## 2019-06-22 MED ORDER — ZOLPIDEM TARTRATE 5 MG PO TABS
5.0000 mg | ORAL_TABLET | Freq: Once | ORAL | Status: AC
  Administered 2019-06-23: 5 mg via ORAL
  Filled 2019-06-22: qty 1

## 2019-06-22 NOTE — Plan of Care (Signed)
For Incision and drainage 06/23/19.

## 2019-06-22 NOTE — H&P (View-Only) (Signed)
[facility] (OTS)  Day 12 with Left hip soft tissue abscess, Day 5 s/p drain placement  Subjective: Patient reports pain as mild to moderate. Eager to go home. Able to use walker.    Objective: Current Vitals Blood pressure 116/65, pulse 84, temperature 97.9 F (36.6 C), resp. rate 18, height 6' 3"  (1.905 m), weight (!) 169.1 kg, SpO2 95 %. Vital signs in last 24 hours: Temp:  [97.9 F (36.6 C)-98.4 F (36.9 C)] 97.9 F (36.6 C) (12/13 0908) Pulse Rate:  [84-101] 84 (12/13 0908) Resp:  [17-19] 18 (12/13 0908) BP: (116-133)/(65-81) 116/65 (12/13 0908) SpO2:  [94 %-99 %] 95 % (12/13 0908) Weight:  [169.1 kg] 169.1 kg (12/12 2116)  Intake/Output from previous day: 12/12 0701 - 12/13 0700 In: 1100 [P.O.:900; IV Piggyback:200] Out: 905 [Urine:900; Drains:5]  LABS Recent Labs    06/20/19 0515 06/21/19 0327 06/22/19 0356  HGB 11.2* 11.3* 10.6*   Recent Labs    06/21/19 0327 06/22/19 0356  WBC 10.5 8.5  RBC 3.65* 3.37*  HCT 34.8* 31.5*  PLT 155 133*   Recent Labs    06/21/19 0327 06/22/19 0356  NA 136 137  K 4.4 4.1  CL 103 104  CO2 25 26  BUN 14 12  CREATININE 0.51* 0.48*  GLUCOSE 139* 130*  CALCIUM 7.9* 8.0*   Recent Labs    06/22/19 0356  INR 1.3*     Physical Exam LLE Dressing intact, clean, dry; JP with 20 cc of purulent material  Edema/ swelling localized at hip  Sens: DPN, SPN, TN intact  Motor: EHL, FHL, and lessor toe ext and flex all intact grossly  Brisk cap refill, warm to touch, DP 2+  Assessment/Plan: 5 Days Post-Op Drain placement I discussed and coordinated the potential plan for management with Dr. Loma Boston at bedside.  1. PT to confirm appropriate for discharge when wound ultimately stable 2. DVT proph Heparin 3. NPO p MN--Anticipate OR tomorrow for surgical debridement and wound vac with probable repeat vac change x 1 or 2 at 48 to 72 hr intervals and eventual closure over a drain with possible d/c on same  day as last procedure. 4. Mild hyperglycemia--effective control will be important for resolution of infection  Altamese Palmas, MD [facility] [phone] (p)

## 2019-06-22 NOTE — Progress Notes (Addendum)
PROGRESS NOTE    Robert Sullivan  JGO:[id number] DOB: Feb 26, 1966 DOA: 06/10/2019 PCP: Cathleen Corti, PA-C    Brief Narrative:  53 y.o. male with history of uncontrolled type 2 diabetes mellitus, morbid obesity was admitted to [facility] from 11/2-11/7 with E. coli UTI, uncontrolled diabetes and a fall that resulted in a left gluteal hematoma, he was treated with IV antibiotics, discharged home on Bactrim, completed course and also on insulin. -He felt well initially after discharge, in the last 7 to 10 days he started having fevers and chills, profuse sweats, 5 to 6 days ago he started having increased pain and swelling at the site of his gluteal hematoma, he also noticed that his urine was darker but denied any dysuria or change in odor presented to the emergency room today with weakness, worsening left hip pain, fevers chills and sweats. -In the ED he was noted to have a sodium of 131, CBG of 380, albumin of 1.8, ALP of 224,  lactic acid of 5.5, white count of 20,000.  In the ED he was noted to have an elevated D-dimer subsequently EDP proceeded with CT angiogram which was negative for pulmonary embolism, his UA was significantly abnormal with cloudy urine, positive nitrite, leukocyte esterase, numerous WBC and bacteria.  In addition noted to have redness and tenderness of his left gluteal region  Assessment & Plan:   Principal Problem:   MSSA bacteremia Active Problems:   Acute lower UTI   Sepsis ([place])   Uncontrolled diabetes mellitus ([place])   Obesity, Class III, BMI 40-49.9 (morbid obesity) ([facility])   Cellulitis, gluteal, left  Sepsis secondary to MSSA bacteremia  Blood cx pos for MSSA, ID now consulted with recommendation for 2d echo to r/o endocarditis, neg findings  TEE performed 12/7 with no evidence of vegetations  currently on ancef    Urinary tract infection  Continued on ancef per above  Presenting UA suggestive of UTI, however, pt denies dysuria or other symptoms  Left  gluteal myositis, abscess - MSSA  Per above, pt is continued on ancef  Marked pain with findings suggestive of myositis on CT  f/u MRI findings per above  Pt reports symptoms seem to be improving. Pt better able to ambulate  Appreciate Orthopedics input.   Pt now s/p drain placement by IR 12/7.  Output currently approx 46m in 24 hours.  WBC stable  Rpt CT shows residual abscess measuring 9.5 x 5.7 x 16.6cm with extension anteriorly measuring 7.8 x 1.9 x 15.5cm, extending to muscular fascia.  Appreciate ortho seeing patient. If becomes unstable overnight, will do I&D tomorrow, otherwise wait until Monday.  Hold heparin and place SCDs   Morbid obesity  BMI of 47  diet/lifestyle modification recommended  Uncontrolled type 2 diabetes mellitus  Hemoglobin A1c was 11 recently  Resume Lantus and NovoLog premeal and sliding scale insulin  Glucose trends stable  1/2 dose lantus tonight, as NPO for tomorrow.  Suspected cirrhosis  Noted on imaging, patient denies any alcohol use. Suspect NASH/fatty liver disease is likely the primary culprit  Hep C neg  GI follow up recommended on d/c  DVT prophylaxis: SCDs Code Status: Full Family Communication: Pt in room, family not at bedside Disposition Plan: Uncertain at this time  Consultants:   ID  Cardiology  Orthopedic Surgery  IR  Procedures:   TEE 12/7  IR guided drain placement to L thigh 12/8  Antimicrobials: Anti-infectives (From admission, onward)   Start     Dose/Rate Route Frequency  Ordered Stop   06/20/19 0000  ceFAZolin (ANCEF) IVPB     2 g Intravenous Every 8 hours 06/20/19 1701 07/29/19 2359   06/11/19 2200  ceFAZolin (ANCEF) IVPB 2g/100 mL premix     2 g 200 mL/hr over 30 Minutes Intravenous Every 8 hours 06/11/19 1456     06/11/19 0200  vancomycin (VANCOCIN) 1,750 mg in sodium chloride 0.9 % 500 mL IVPB  Status:  Discontinued     1,750 mg 250 mL/hr over 120 Minutes Intravenous Every 12  hours 06/10/19 1257 06/11/19 1456   06/10/19 2200  ceFEPIme (MAXIPIME) 2 g in sodium chloride 0.9 % 100 mL IVPB  Status:  Discontinued     2 g 200 mL/hr over 30 Minutes Intravenous Every 8 hours 06/10/19 1257 06/10/19 1740   06/10/19 1745  cefTRIAXone (ROCEPHIN) 1 g in sodium chloride 0.9 % 100 mL IVPB  Status:  Discontinued     1 g 200 mL/hr over 30 Minutes Intravenous Every 24 hours 06/10/19 1740 06/11/19 1456   06/10/19 1245  ceFEPIme (MAXIPIME) 2 g in sodium chloride 0.9 % 100 mL IVPB     2 g 200 mL/hr over 30 Minutes Intravenous  Once 06/10/19 1239 06/10/19 1411   06/10/19 1245  vancomycin (VANCOCIN) IVPB 1000 mg/200 mL premix  Status:  Discontinued     1,000 mg 200 mL/hr over 60 Minutes Intravenous  Once 06/10/19 1239 06/10/19 1241   06/10/19 1245  vancomycin (VANCOCIN) 2,500 mg in sodium chloride 0.9 % 500 mL IVPB     2,500 mg 250 mL/hr over 120 Minutes Intravenous  Once 06/10/19 1241 06/10/19 1635      Subjective: Remains frustrated with lack of improvement.  No new complaints.  No chest pain, shortness of breath, abdominal pain.  Leg still feels heavy and has significant pain, although improved with pain medicine.  He is able to ambulate still  Objective: Vitals:   06/21/19 1803 06/21/19 2116 06/22/19 0541 06/22/19 0908  BP: 133/75 128/81 123/74 116/65  Pulse: (!) 101 100 87 84  Resp: 18 19 17 18   Temp: 98.1 F (36.7 C) 98.2 F (36.8 C) 98.4 F (36.9 C) 97.9 F (36.6 C)  TempSrc: Oral     SpO2: 99% 94% 97% 95%  Weight:  (!) 169.1 kg    Height:        Intake/Output Summary (Last 24 hours) at 06/22/2019 1143 Last data filed at 06/22/2019 0900 Gross per 24 hour  Intake 1080 ml  Output 905 ml  Net 175 ml   Filed Weights   06/17/19 2034 06/20/19 0541 06/21/19 2116  Weight: (!) 170.3 kg (!) 169 kg (!) 169.1 kg    Examination: General exam: Awake, alert, no acute distress. Respiratory system: Good respiratory effort with no wheezes, rales,  rhonchi Cardiovascular system: Regular rate.  Normal S1-S2 sounds. Gastrointestinal system: Soft, nontender, nondistended. Central nervous system: CN2-12 grossly intact, strength intact Extremities: Minimal edema.  Pulses normal. Skin: Drain in left buttock.  Skin around drain normal without erythema.  Petechial rash in right upper arm appears to be improving.   Psychiatry: Mood normal // no visual hallucinations   Data Reviewed: I have personally reviewed following labs and imaging studies  CBC: Recent Labs  Lab 06/18/19 0211 06/19/19 0430 06/20/19 0515 06/21/19 0327 06/22/19 0356  WBC 10.8* 10.2 10.1 10.5 8.5  NEUTROABS  --   --  7.2  --   --   HGB 12.2* 11.4* 11.2* 11.3* 10.6*  HCT 36.7* 34.4*  33.9* 34.8* 31.5*  MCV 92.2 93.5 92.6 95.3 93.5  PLT 136* 143* 143* 155 568*   Basic Metabolic Panel: Recent Labs  Lab 06/17/19 0611 06/18/19 0211 06/19/19 0430 06/20/19 0515 06/21/19 0327 06/22/19 0356  NA 134* 135 136  --  136 137  K 4.0 3.9 3.9  --  4.4 4.1  CL 101 101 104  --  103 104  CO2 22 24 24   --  25 26  GLUCOSE 173* 142* 133*  --  139* 130*  BUN 18 15 14   --  14 12  CREATININE 0.59* 0.55* 0.49*  --  0.51* 0.48*  CALCIUM 7.9* 8.0* 7.9*  --  7.9* 8.0*  MG  --   --   --  1.9  --   --   PHOS  --   --   --  3.2  --   --    GFR: Estimated Creatinine Clearance: 180.7 mL/min (A) (by C-G formula based on SCr of 0.48 mg/dL (L)). Liver Function Tests: Recent Labs  Lab 06/16/19 0512 06/17/19 0611 06/18/19 0211  AST 81* 80* 82*  ALT 28 27 27   ALKPHOS 299* 300* 300*  BILITOT 1.2 1.4* 1.3*  PROT 5.9* 6.4* 6.6  ALBUMIN 1.2* 1.3* 1.3*   No results for input(s): LIPASE, AMYLASE in the last 168 hours. No results for input(s): AMMONIA in the last 168 hours. Coagulation Profile: Recent Labs  Lab 06/16/19 1656 06/22/19 0356  INR 1.4* 1.3*   Cardiac Enzymes: No results for input(s): CKTOTAL, CKMB, CKMBINDEX, TROPONINI in the last 168 hours. BNP (last 3 results) No  results for input(s): PROBNP in the last 8760 hours. HbA1C: No results for input(s): HGBA1C in the last 72 hours. CBG: Recent Labs  Lab 06/21/19 1113 06/21/19 1631 06/21/19 2117 06/22/19 0713 06/22/19 1120  GLUCAP 168* 192* 145* 137* 134*   Lipid Profile: No results for input(s): CHOL, HDL, LDLCALC, TRIG, CHOLHDL, LDLDIRECT in the last 72 hours. Thyroid Function Tests: No results for input(s): TSH, T4TOTAL, FREET4, T3FREE, THYROIDAB in the last 72 hours. Anemia Panel: No results for input(s): VITAMINB12, FOLATE, FERRITIN, TIBC, IRON, RETICCTPCT in the last 72 hours. Sepsis Labs: No results for input(s): PROCALCITON, LATICACIDVEN in the last 168 hours.  Recent Results (from the past 240 hour(s))  Aerobic/Anaerobic Culture (surgical/deep wound)     Status: None   Collection Time: 06/17/19 11:27 AM   Specimen: Abscess  Result Value Ref Range Status   Specimen Description ABSCESS LEFT GLUTEAL  Final   Special Requests Normal  Final   Gram Stain   Final    ABUNDANT WBC PRESENT,BOTH PMN AND MONONUCLEAR ABUNDANT GRAM POSITIVE COCCI    Culture   Final    MODERATE STAPHYLOCOCCUS AUREUS NO ANAEROBES ISOLATED Performed at [facility], [address] [place], [place] [address]    Report Status 06/22/2019 FINAL  Final   Organism ID, Bacteria STAPHYLOCOCCUS AUREUS  Final      Susceptibility   Staphylococcus aureus - MIC*    CIPROFLOXACIN <=0.5 SENSITIVE Sensitive     ERYTHROMYCIN <=0.25 SENSITIVE Sensitive     GENTAMICIN <=0.5 SENSITIVE Sensitive     OXACILLIN 0.5 SENSITIVE Sensitive     TETRACYCLINE >=16 RESISTANT Resistant     VANCOMYCIN 1 SENSITIVE Sensitive     TRIMETH/SULFA <=10 SENSITIVE Sensitive     CLINDAMYCIN <=0.25 SENSITIVE Sensitive     RIFAMPIN <=0.5 SENSITIVE Sensitive     Inducible Clindamycin NEGATIVE Sensitive     * MODERATE  STAPHYLOCOCCUS AUREUS     Radiology Studies: CT PELVIS W CONTRAST  Result Date: 06/20/2019 CLINICAL DATA:  Abscess, LEFT  leg swelling, abscess drainage, diabetes mellitus EXAM: CT PELVIS WITH CONTRAST TECHNIQUE: Multidetector CT imaging of the pelvis was performed using the standard protocol following the bolus administration of intravenous contrast. Sagittal and coronal MPR images reconstructed from axial data set. CONTRAST:  19m OMNIPAQUE IOHEXOL 300 MG/ML  SOLN IV COMPARISON:  06/10/2019 FINDINGS: Urinary Tract: Normal appearing bladder, distal ureters and visualized inferior poles of the kidneys Bowel: Sigmoid diverticulosis. Prominent stool in rectum. Normal appendix. Vascular/Lymphatic: Vascular structures patent Reproductive:  Normal prostate gland Other: Ascites in pelvis. No free air. Scattered subcutaneous edema at the flanks and proximal LEFT thigh laterally. Musculoskeletal: Osseous structures unremarkable. LEFT buttock/proximal LEFT lower extremity: Percutaneous pigtail drainage catheter LEFT gluteal muscles. Bilobed residual abscess collection identified at the LEFT gluteal muscles. Dominant collection more posteriorly, containing the drainage catheter, 9.5 x 5.7 x 16.6 cm. Thinner collection extending anteriorly and descending laterally at the upper LEFT leg, 7.8 x 1.9 cm in axial dimensions and extending 15.5 cm length, containing tiny foci of gas, appears to extend deep to muscular fascia. Overlying subcutaneous infiltration. Intermuscular edema identified at the posterior muscles of the LEFT thigh without discrete fluid collection. No joint effusion. IMPRESSION: Persistent large LEFT gluteal abscess collection 9.5 x 5.7 cm, 16.6 cm length, containing the pigtail drainage catheter. Additional thinner contiguous abscess collection extending more anteriorly and descending laterally into the lateral LEFT leg 7.8 x 1.9 in axial dimensions, 15.5 cm length, extends deep to muscular fascia. Additional intermuscular fluid at the LEFT hamstring muscles. Scattered subcutaneous edema at the flanks and LEFT hip region. Ascites.  Electronically Signed   By: MLavonia DanaM.D.   On: 06/20/2019 17:25    Scheduled Meds: . bisacodyl  10 mg Rectal Once  . Chlorhexidine Gluconate Cloth  6 each Topical Daily  . cholecalciferol  2,000 Units Oral Daily  . insulin aspart  0-15 Units Subcutaneous TID WC  . insulin aspart  0-5 Units Subcutaneous QHS  . insulin aspart  8 Units Subcutaneous TID WC  . insulin glargine  25 Units Subcutaneous Daily  . loratadine  10 mg Oral Daily  . multivitamin with minerals  1 tablet Oral Daily  . Ensure Max Protein  11 oz Oral Daily  . sodium chloride flush  10-40 mL Intracatheter Q12H  . sodium chloride flush  5 mL Intracatheter Q8H  . vitamin C  1,000 mg Oral Daily   Continuous Infusions: . sodium chloride 500 mL (06/17/19 0545)  .  ceFAZolin (ANCEF) IV 2 g (06/22/19 0530)     LOS: 12 days   JTruett Mainland DO [facility] Pager On Amion  If 7PM-7AM, please contact night-coverage 06/22/2019, 11:43 AM

## 2019-06-22 NOTE — Progress Notes (Addendum)
[facility] (OTS)  Day 12 with Left hip soft tissue abscess, Day 5 s/p drain placement  Subjective: Patient reports pain as mild to moderate. Eager to go home. Able to use walker.    Objective: Current Vitals Blood pressure 116/65, pulse 84, temperature 97.9 F (36.6 C), resp. rate 18, height 6' 3"  (1.905 m), weight (!) 169.1 kg, SpO2 95 %. Vital signs in last 24 hours: Temp:  [97.9 F (36.6 C)-98.4 F (36.9 C)] 97.9 F (36.6 C) (12/13 0908) Pulse Rate:  [84-101] 84 (12/13 0908) Resp:  [17-19] 18 (12/13 0908) BP: (116-133)/(65-81) 116/65 (12/13 0908) SpO2:  [94 %-99 %] 95 % (12/13 0908) Weight:  [169.1 kg] 169.1 kg (12/12 2116)  Intake/Output from previous day: 12/12 0701 - 12/13 0700 In: 1100 [P.O.:900; IV Piggyback:200] Out: 905 [Urine:900; Drains:5]  LABS Recent Labs    06/20/19 0515 06/21/19 0327 06/22/19 0356  HGB 11.2* 11.3* 10.6*   Recent Labs    06/21/19 0327 06/22/19 0356  WBC 10.5 8.5  RBC 3.65* 3.37*  HCT 34.8* 31.5*  PLT 155 133*   Recent Labs    06/21/19 0327 06/22/19 0356  NA 136 137  K 4.4 4.1  CL 103 104  CO2 25 26  BUN 14 12  CREATININE 0.51* 0.48*  GLUCOSE 139* 130*  CALCIUM 7.9* 8.0*   Recent Labs    06/22/19 0356  INR 1.3*     Physical Exam LLE Dressing intact, clean, dry; JP with 20 cc of purulent material  Edema/ swelling localized at hip  Sens: DPN, SPN, TN intact  Motor: EHL, FHL, and lessor toe ext and flex all intact grossly  Brisk cap refill, warm to touch, DP 2+  Assessment/Plan: 5 Days Post-Op Drain placement I discussed and coordinated the potential plan for management with Dr. Loma Boston at bedside.  1. PT to confirm appropriate for discharge when wound ultimately stable 2. DVT proph Heparin 3. NPO p MN--Anticipate OR tomorrow for surgical debridement and wound vac with probable repeat vac change x 1 or 2 at 48 to 72 hr intervals and eventual closure over a drain with possible d/c on same  day as last procedure. 4. Mild hyperglycemia--effective control will be important for resolution of infection  Altamese [place], MD [facility] [phone] (p)

## 2019-06-22 NOTE — Plan of Care (Signed)
  Problem: Clinical Measurements: Goal: Will remain free from infection Outcome: Not Progressing  For repeat I and D on 06/23/19

## 2019-06-23 ENCOUNTER — Encounter (HOSPITAL_COMMUNITY)
Admission: EM | Disposition: A | Discharge status: Home Health | Payer: Self-pay | Source: Home / Self Care | Attending: Internal Medicine

## 2019-06-23 ENCOUNTER — Inpatient Hospital Stay (HOSPITAL_COMMUNITY): Payer: Self-pay | Admitting: Anesthesiology

## 2019-06-23 ENCOUNTER — Encounter (HOSPITAL_COMMUNITY): Payer: Self-pay | Admitting: Internal Medicine

## 2019-06-23 HISTORY — PX: INCISION AND DRAINAGE ABSCESS: SHX5864

## 2019-06-23 LAB — BASIC METABOLIC PANEL
Anion gap: 8 (ref 5–15)
BUN: 15 mg/dL (ref 6–20)
CO2: 23 mmol/L (ref 22–32)
Calcium: 8 mg/dL — ABNORMAL LOW (ref 8.9–10.3)
Chloride: 104 mmol/L (ref 98–111)
Creatinine, Ser: 0.62 mg/dL (ref 0.61–1.24)
GFR calc Af Amer: >60 mL/min (ref >60)
GFR calc non Af Amer: >60 mL/min (ref >60)
Glucose, Bld: 174 mg/dL — ABNORMAL HIGH (ref 70–99)
Potassium: 4.3 mmol/L (ref 3.5–5.1)
Sodium: 135 mmol/L (ref 135–145)

## 2019-06-23 LAB — CBC
HCT: 33.9 % — ABNORMAL LOW (ref 39.0–52.0)
Hemoglobin: 11.3 g/dL — ABNORMAL LOW (ref 13.0–17.0)
MCH: 31.2 pg (ref 26.0–34.0)
MCHC: 33.3 g/dL (ref 30.0–36.0)
MCV: 93.6 fL (ref 80.0–100.0)
Platelets: 142 10*3/uL — ABNORMAL LOW (ref 150–400)
RBC: 3.62 MIL/uL — ABNORMAL LOW (ref 4.22–5.81)
RDW: 15.7 % — ABNORMAL HIGH (ref 11.5–15.5)
WBC: 13.8 10*3/uL — ABNORMAL HIGH (ref 4.0–10.5)
nRBC: 0 % (ref 0.0–0.2)

## 2019-06-23 LAB — GLUCOSE, CAPILLARY
Glucose-Capillary: 144 mg/dL — ABNORMAL HIGH (ref 70–99)
Glucose-Capillary: 112 mg/dL — ABNORMAL HIGH (ref 70–99)
Glucose-Capillary: 102 mg/dL — ABNORMAL HIGH (ref 70–99)
Glucose-Capillary: 126 mg/dL — ABNORMAL HIGH (ref 70–99)
Glucose-Capillary: 138 mg/dL — ABNORMAL HIGH (ref 70–99)

## 2019-06-23 SURGERY — INCISION AND DRAINAGE, ABSCESS
Anesthesia: General | Laterality: Left

## 2019-06-23 MED ORDER — LIDOCAINE 2% (20 MG/ML) 5 ML SYRINGE
INTRAMUSCULAR | Status: DC | PRN
  Administered 2019-06-23: 100 mg via INTRAVENOUS

## 2019-06-23 MED ORDER — SUGAMMADEX SODIUM 500 MG/5ML IV SOLN
INTRAVENOUS | Status: DC | PRN
  Administered 2019-06-23: 400 mg via INTRAVENOUS

## 2019-06-23 MED ORDER — SUCCINYLCHOLINE CHLORIDE 200 MG/10ML IV SOSY
PREFILLED_SYRINGE | INTRAVENOUS | Status: DC | PRN
  Administered 2019-06-23: 200 mg via INTRAVENOUS

## 2019-06-23 MED ORDER — FENTANYL CITRATE (PF) 100 MCG/2ML IJ SOLN
INTRAMUSCULAR | Status: AC
  Filled 2019-06-23: qty 2

## 2019-06-23 MED ORDER — MIDAZOLAM HCL 5 MG/5ML IJ SOLN
INTRAMUSCULAR | Status: DC | PRN
  Administered 2019-06-23: 2 mg via INTRAVENOUS

## 2019-06-23 MED ORDER — PHENYLEPHRINE 40 MCG/ML (10ML) SYRINGE FOR IV PUSH (FOR BLOOD PRESSURE SUPPORT)
PREFILLED_SYRINGE | INTRAVENOUS | Status: DC | PRN
  Administered 2019-06-23 (×3): 80 ug via INTRAVENOUS

## 2019-06-23 MED ORDER — ROCURONIUM BROMIDE 100 MG/10ML IV SOLN
INTRAVENOUS | Status: DC | PRN
  Administered 2019-06-23: 60 mg via INTRAVENOUS

## 2019-06-23 MED ORDER — BUPIVACAINE HCL (PF) 0.25 % IJ SOLN
INTRAMUSCULAR | Status: AC
  Filled 2019-06-23: qty 30

## 2019-06-23 MED ORDER — PROPOFOL 10 MG/ML IV BOLUS
INTRAVENOUS | Status: AC
  Filled 2019-06-23: qty 40

## 2019-06-23 MED ORDER — MIDAZOLAM HCL 2 MG/2ML IJ SOLN
INTRAMUSCULAR | Status: AC
  Filled 2019-06-23: qty 2

## 2019-06-23 MED ORDER — HYDROMORPHONE HCL 1 MG/ML IJ SOLN
1.0000 mg | INTRAMUSCULAR | Status: DC | PRN
  Administered 2019-06-23 – 2019-06-24 (×4): 1 mg via INTRAVENOUS
  Filled 2019-06-23 (×5): qty 1

## 2019-06-23 MED ORDER — ONDANSETRON HCL 4 MG/2ML IJ SOLN: 4.0000 mg | Freq: Once | INTRAMUSCULAR | Status: DC | PRN

## 2019-06-23 MED ORDER — FENTANYL CITRATE (PF) 250 MCG/5ML IJ SOLN
INTRAMUSCULAR | Status: AC
  Filled 2019-06-23: qty 5

## 2019-06-23 MED ORDER — SODIUM CHLORIDE 0.9 % IR SOLN
Status: DC | PRN
  Administered 2019-06-23 (×2): 3000 mL

## 2019-06-23 MED ORDER — VANCOMYCIN HCL 1000 MG IV SOLR
INTRAVENOUS | Status: DC | PRN
  Administered 2019-06-23: 1000 mg via TOPICAL

## 2019-06-23 MED ORDER — PROPOFOL 10 MG/ML IV BOLUS
INTRAVENOUS | Status: DC | PRN
  Administered 2019-06-23: 200 mg via INTRAVENOUS

## 2019-06-23 MED ORDER — FENTANYL CITRATE (PF) 100 MCG/2ML IJ SOLN
25.0000 ug | INTRAMUSCULAR | Status: DC | PRN
  Administered 2019-06-23: 15:00:00 50 ug via INTRAVENOUS

## 2019-06-23 MED ORDER — ONDANSETRON HCL 4 MG/2ML IJ SOLN
INTRAMUSCULAR | Status: DC | PRN
  Administered 2019-06-23: 4 mg via INTRAVENOUS

## 2019-06-23 MED ORDER — FENTANYL CITRATE (PF) 100 MCG/2ML IJ SOLN
INTRAMUSCULAR | Status: DC | PRN
  Administered 2019-06-23 (×2): 50 ug via INTRAVENOUS
  Administered 2019-06-23: 100 ug via INTRAVENOUS

## 2019-06-23 MED ORDER — LACTATED RINGERS IV SOLN
INTRAVENOUS | Status: DC | PRN
  Administered 2019-06-23: 13:00:00 via INTRAVENOUS

## 2019-06-23 MED ORDER — CEFAZOLIN SODIUM-DEXTROSE 2-3 GM-%(50ML) IV SOLR
INTRAVENOUS | Status: DC | PRN
  Administered 2019-06-23: 2 g via INTRAVENOUS

## 2019-06-23 MED ORDER — VANCOMYCIN HCL 1000 MG IV SOLR
INTRAVENOUS | Status: AC
  Filled 2019-06-23: qty 1000

## 2019-06-23 SURGICAL SUPPLY — 61 items
BANDAGE ESMARK 6X9 LF (GAUZE/BANDAGES/DRESSINGS) IMPLANT
BLADE SURG 10 STRL SS (BLADE) ×3 IMPLANT
BNDG COHESIVE 4X5 TAN STRL (GAUZE/BANDAGES/DRESSINGS) ×1 IMPLANT
BNDG ELASTIC 4X5.8 VLCR STR LF (GAUZE/BANDAGES/DRESSINGS) ×1 IMPLANT
BNDG ELASTIC 6X5.8 VLCR STR LF (GAUZE/BANDAGES/DRESSINGS) ×1 IMPLANT
BNDG ESMARK 4X9 LF (GAUZE/BANDAGES/DRESSINGS) IMPLANT
BNDG ESMARK 6X9 LF (GAUZE/BANDAGES/DRESSINGS)
BNDG GAUZE ELAST 4 BULKY (GAUZE/BANDAGES/DRESSINGS) ×1 IMPLANT
CONT SPEC 4OZ CLIKSEAL STRL BL (MISCELLANEOUS) IMPLANT
COVER SURGICAL LIGHT HANDLE (MISCELLANEOUS) ×3 IMPLANT
COVER WAND RF STERILE (DRAPES) ×1 IMPLANT
CUFF TOURN SGL LL 12 NO SLV (MISCELLANEOUS) IMPLANT
CUFF TOURN SGL QUICK 34 (TOURNIQUET CUFF)
CUFF TRNQT CYL 34X4.125X (TOURNIQUET CUFF) IMPLANT
DRAPE IMP U-DRAPE 54X76 (DRAPES) ×2 IMPLANT
DRAPE ORTHO SPLIT 77X108 STRL (DRAPES) ×2
DRAPE SURG 17X23 STRL (DRAPES) IMPLANT
DRAPE SURG ORHT 6 SPLT 77X108 (DRAPES) IMPLANT
DRAPE U-SHAPE 47X51 STRL (DRAPES) ×2 IMPLANT
DRSG PAD ABDOMINAL 8X10 ST (GAUZE/BANDAGES/DRESSINGS) ×3 IMPLANT
DURAPREP 26ML APPLICATOR (WOUND CARE) ×3 IMPLANT
ELECT REM PT RETURN 9FT ADLT (ELECTROSURGICAL) ×3
ELECTRODE REM PT RTRN 9FT ADLT (ELECTROSURGICAL) IMPLANT
EVACUATOR 1/8 PVC DRAIN (DRAIN) IMPLANT
EVACUATOR 3/16  PVC DRAIN (DRAIN) ×4
EVACUATOR 3/16 PVC DRAIN (DRAIN) IMPLANT
FACESHIELD WRAPAROUND (MASK) ×3 IMPLANT
FACESHIELD WRAPAROUND OR TEAM (MASK) ×1 IMPLANT
GAUZE SPONGE 4X4 12PLY STRL (GAUZE/BANDAGES/DRESSINGS) ×3 IMPLANT
GAUZE XEROFORM 1X8 LF (GAUZE/BANDAGES/DRESSINGS) ×3 IMPLANT
GLOVE BIO SURGEON STRL SZ7.5 (GLOVE) ×6 IMPLANT
GLOVE BIOGEL PI IND STRL 8 (GLOVE) ×2 IMPLANT
GLOVE BIOGEL PI INDICATOR 8 (GLOVE) ×4
GLOVE SURG SS PI 6.5 STRL IVOR (GLOVE) ×2 IMPLANT
GOWN STRL REUS W/ TWL LRG LVL3 (GOWN DISPOSABLE) ×3 IMPLANT
GOWN STRL REUS W/TWL LRG LVL3 (GOWN DISPOSABLE) ×6
HANDPIECE INTERPULSE COAX TIP (DISPOSABLE)
KIT BASIN OR (CUSTOM PROCEDURE TRAY) ×3 IMPLANT
KIT TURNOVER KIT B (KITS) ×3 IMPLANT
MANIFOLD NEPTUNE II (INSTRUMENTS) ×3 IMPLANT
NDL HYPO 25GX1X1/2 BEV (NEEDLE) IMPLANT
NEEDLE HYPO 25GX1X1/2 BEV (NEEDLE) IMPLANT
NS IRRIG 1000ML POUR BTL (IV SOLUTION) ×3 IMPLANT
PACK ORTHO EXTREMITY (CUSTOM PROCEDURE TRAY) ×3 IMPLANT
PAD ARMBOARD 7.5X6 YLW CONV (MISCELLANEOUS) ×6 IMPLANT
SET CYSTO W/LG BORE CLAMP LF (SET/KITS/TRAYS/PACK) ×2 IMPLANT
SET HNDPC FAN SPRY TIP SCT (DISPOSABLE) IMPLANT
SPONGE LAP 18X18 RF (DISPOSABLE) ×2 IMPLANT
STOCKINETTE IMPERVIOUS 9X36 MD (GAUZE/BANDAGES/DRESSINGS) ×3 IMPLANT
SUT ETHILON 2 0 PSLX (SUTURE) ×4 IMPLANT
SUT ETHILON 3 0 PS 1 (SUTURE) IMPLANT
SUT PDS AB 2-0 CT1 27 (SUTURE) IMPLANT
SWAB CULTURE ESWAB REG 1ML (MISCELLANEOUS) IMPLANT
SYR CONTROL 10ML LL (SYRINGE) IMPLANT
TAPE CLOTH SURG 4X10 WHT LF (GAUZE/BANDAGES/DRESSINGS) ×2 IMPLANT
TOWEL GREEN STERILE (TOWEL DISPOSABLE) ×3 IMPLANT
TOWEL GREEN STERILE FF (TOWEL DISPOSABLE) ×3 IMPLANT
TUBE CONNECTING 12'X1/4 (SUCTIONS) ×1
TUBE CONNECTING 12X1/4 (SUCTIONS) ×2 IMPLANT
UNDERPAD 30X30 (UNDERPADS AND DIAPERS) ×5 IMPLANT
YANKAUER SUCT BULB TIP NO VENT (SUCTIONS) ×3 IMPLANT

## 2019-06-23 NOTE — Plan of Care (Signed)
  Problem: Clinical Measurements: Goal: Ability to maintain clinical measurements within normal limits will improve Outcome: Progressing   

## 2019-06-23 NOTE — Anesthesia Preprocedure Evaluation (Addendum)
Anesthesia Evaluation  Patient identified by MRN, date of birth, ID band Patient awake    Reviewed: Allergy & Precautions, NPO status , Patient's Chart, lab work & pertinent test results  History of Anesthesia Complications Negative for: history of anesthetic complications  Airway Mallampati: II  TM Distance: >3 FB Neck ROM: Full    Dental no notable dental hx.    Pulmonary sleep apnea ,    Pulmonary exam normal        Cardiovascular negative cardio ROS Normal cardiovascular exam     Neuro/Psych negative neurological ROS  negative psych ROS   GI/Hepatic negative GI ROS, Neg liver ROS,   Endo/Other  diabetes, Poorly Controlled, Type 2, Insulin DependentMorbid obesity  Renal/GU negative Renal ROS  negative genitourinary   Musculoskeletal  (+) Arthritis ,   Abdominal   Peds  Hematology  (+) anemia , Hgb 11.3, plt 142   Anesthesia Other Findings   Reproductive/Obstetrics negative OB ROS                            Anesthesia Physical Anesthesia Plan  ASA: III  Anesthesia Plan: General   Post-op Pain Management:    Induction: Intravenous  PONV Risk Score and Plan: 3 and Treatment may vary due to age or medical condition, Ondansetron and Midazolam  Airway Management Planned: Oral ETT  Additional Equipment: None  Intra-op Plan:   Post-operative Plan: Extubation in OR  Informed Consent: I have reviewed the patients History and Physical, chart, labs and discussed the procedure including the risks, benefits and alternatives for the proposed anesthesia with the patient or authorized representative who has indicated his/her understanding and acceptance.     Dental advisory given  Plan Discussed with: CRNA  Anesthesia Plan Comments:        Anesthesia Quick Evaluation

## 2019-06-23 NOTE — Interval H&P Note (Signed)
I discussed R&B of I&D of left gluteal abscess and he wishes to proceed with this.   Renette Butters

## 2019-06-23 NOTE — Progress Notes (Addendum)
PROGRESS NOTE    Cane Dubray  LZJ:[id number] DOB: 1965-08-23 DOA: 06/10/2019 PCP: Cathleen Corti, PA-C    Brief Narrative:  53 y.o. male with history of uncontrolled type 2 diabetes mellitus, morbid obesity was admitted to [facility] from 11/2-11/7 with E. coli UTI, uncontrolled diabetes and a fall that resulted in a left gluteal hematoma, he was treated with IV antibiotics, discharged home on Bactrim, completed course and also on insulin. -He felt well initially after discharge, in the last 7 to 10 days he started having fevers and chills, profuse sweats, 5 to 6 days ago he started having increased pain and swelling at the site of his gluteal hematoma, he also noticed that his urine was darker but denied any dysuria or change in odor presented to the emergency room today with weakness, worsening left hip pain, fevers chills and sweats. -In the ED he was noted to have a sodium of 131, CBG of 380, albumin of 1.8, ALP of 224,  lactic acid of 5.5, white count of 20,000.  In the ED he was noted to have an elevated D-dimer subsequently EDP proceeded with CT angiogram which was negative for pulmonary embolism, his UA was significantly abnormal with cloudy urine, positive nitrite, leukocyte esterase, numerous WBC and bacteria.  In addition noted to have redness and tenderness of his left gluteal region  Assessment & Plan:   Principal Problem:   MSSA bacteremia Active Problems:   Acute lower UTI   Sepsis ([place])   Uncontrolled diabetes mellitus ([place])   Obesity, Class III, BMI 40-49.9 (morbid obesity) (HCC)   Cellulitis, gluteal, left  Sepsis secondary to MSSA bacteremia  Blood cx pos for MSSA, ID now consulted with recommendation for 2d echo to r/o endocarditis, neg findings  TEE performed 12/7 with no evidence of vegetations  currently on ancef    Urinary tract infection  Continued on ancef per above  Presenting UA suggestive of UTI, however, pt denies dysuria or other symptoms  Left  gluteal myositis, abscess - MSSA  Per above, pt is continued on ancef  Marked pain with findings suggestive of myositis on CT  f/u MRI findings per above  Pt reports symptoms seem to be improving. Pt better able to ambulate  Appreciate Orthopedics input.   Pt now s/p drain placement by IR 12/7.  Output currently approx 61m in 24 hours.  WBC stable  Rpt CT shows residual abscess measuring 9.5 x 5.7 x 16.6cm with extension anteriorly measuring 7.8 x 1.9 x 15.5cm, extending to muscular fascia.  I&D with washout today   Morbid obesity  BMI of 47  diet/lifestyle modification recommended  Uncontrolled type 2 diabetes mellitus  Hemoglobin A1c was 11 recently  Resume Lantus and NovoLog premeal and sliding scale insulin  Glucose trends stable  1/2 dose lantus tonight, as NPO for tomorrow.  Suspected cirrhosis  Noted on imaging, patient denies any alcohol use. Suspect NASH/fatty liver disease is likely the primary culprit  Hep C neg  GI follow up recommended on d/c  DVT prophylaxis: SCDs Code Status: Full Family Communication: Pt in room, family not at bedside Disposition Plan: Uncertain at this time  Consultants:   ID  Cardiology  Orthopedic Surgery  IR  Procedures:   TEE 12/7  IR guided drain placement to L thigh 12/8  Antimicrobials: Anti-infectives (From admission, onward)   Start     Dose/Rate Route Frequency Ordered Stop   06/20/19 0000  ceFAZolin (ANCEF) IVPB     2 g Intravenous Every  8 hours 06/20/19 1701 07/29/19 2359   06/11/19 2200  [MAR Hold]  ceFAZolin (ANCEF) IVPB 2g/100 mL premix     (MAR Hold since Mon 06/23/2019 at 1247.Hold Reason: Transfer to a Procedural area.)   2 g 200 mL/hr over 30 Minutes Intravenous Every 8 hours 06/11/19 1456     06/11/19 0200  vancomycin (VANCOCIN) 1,750 mg in sodium chloride 0.9 % 500 mL IVPB  Status:  Discontinued     1,750 mg 250 mL/hr over 120 Minutes Intravenous Every 12 hours 06/10/19 1257  06/11/19 1456   06/10/19 2200  ceFEPIme (MAXIPIME) 2 g in sodium chloride 0.9 % 100 mL IVPB  Status:  Discontinued     2 g 200 mL/hr over 30 Minutes Intravenous Every 8 hours 06/10/19 1257 06/10/19 1740   06/10/19 1745  cefTRIAXone (ROCEPHIN) 1 g in sodium chloride 0.9 % 100 mL IVPB  Status:  Discontinued     1 g 200 mL/hr over 30 Minutes Intravenous Every 24 hours 06/10/19 1740 06/11/19 1456   06/10/19 1245  ceFEPIme (MAXIPIME) 2 g in sodium chloride 0.9 % 100 mL IVPB     2 g 200 mL/hr over 30 Minutes Intravenous  Once 06/10/19 1239 06/10/19 1411   06/10/19 1245  vancomycin (VANCOCIN) IVPB 1000 mg/200 mL premix  Status:  Discontinued     1,000 mg 200 mL/hr over 60 Minutes Intravenous  Once 06/10/19 1239 06/10/19 1241   06/10/19 1245  vancomycin (VANCOCIN) 2,500 mg in sodium chloride 0.9 % 500 mL IVPB     2,500 mg 250 mL/hr over 120 Minutes Intravenous  Once 06/10/19 1241 06/10/19 1635      Subjective: Hungry, no new problems. Increasing pain in left thigh. No neuropathy or radiculopathy.  Objective: Vitals:   06/22/19 0908 06/22/19 1810 06/22/19 2059 06/23/19 0535  BP: 116/65 (!) 148/130 (!) 153/74 (!) 145/69  Pulse: 84 87 94 83  Resp: 18 18 18 18   Temp: 97.9 F (36.6 C) 98.4 F (36.9 C) 98.1 F (36.7 C) 97.9 F (36.6 C)  TempSrc:  Oral Oral Oral  SpO2: 95% 96% 99% 100%  Weight:   (!) 169.1 kg   Height:        Intake/Output Summary (Last 24 hours) at 06/23/2019 1303 Last data filed at 06/23/2019 1118 Gross per 24 hour  Intake 550 ml  Output 900 ml  Net -350 ml   Filed Weights   06/21/19 2116 06/22/19 0541 06/22/19 2059  Weight: (!) 169.1 kg (!) 169.1 kg (!) 169.1 kg    Examination: General exam: Awake, alert, no acute distress. Respiratory system: Good respiratory effort with no wheezes, rales, rhonchi Cardiovascular system: Regular rate.  Normal S1-S2 sounds. Gastrointestinal system: Soft, nontender, nondistended. Central nervous system: CN2-12 grossly  intact, strength intact Extremities: Minimal edema.  Pulses normal. Skin: Drain in left buttock.  Skin around drain normal without erythema.  Petechial rash in right upper arm appears to be improving.   Psychiatry: Mood normal // no visual hallucinations   Data Reviewed: I have personally reviewed following labs and imaging studies  CBC: Recent Labs  Lab 06/19/19 0430 06/20/19 0515 06/21/19 0327 06/22/19 0356 06/23/19 0254  WBC 10.2 10.1 10.5 8.5 13.8*  NEUTROABS  --  7.2  --   --   --   HGB 11.4* 11.2* 11.3* 10.6* 11.3*  HCT 34.4* 33.9* 34.8* 31.5* 33.9*  MCV 93.5 92.6 95.3 93.5 93.6  PLT 143* 143* 155 133* 785*   Basic Metabolic Panel: Recent Labs  Lab 06/18/19 0211 06/19/19 0430 06/20/19 0515 06/21/19 0327 06/22/19 0356 06/23/19 0254  NA 135 136  --  136 137 135  K 3.9 3.9  --  4.4 4.1 4.3  CL 101 104  --  103 104 104  CO2 24 24  --  25 26 23   GLUCOSE 142* 133*  --  139* 130* 174*  BUN 15 14  --  14 12 15   CREATININE 0.55* 0.49*  --  0.51* 0.48* 0.62  CALCIUM 8.0* 7.9*  --  7.9* 8.0* 8.0*  MG  --   --  1.9  --   --   --   PHOS  --   --  3.2  --   --   --    GFR: Estimated Creatinine Clearance: 180.7 mL/min (by C-G formula based on SCr of 0.62 mg/dL). Liver Function Tests: Recent Labs  Lab 06/17/19 0611 06/18/19 0211  AST 80* 82*  ALT 27 27  ALKPHOS 300* 300*  BILITOT 1.4* 1.3*  PROT 6.4* 6.6  ALBUMIN 1.3* 1.3*   No results for input(s): LIPASE, AMYLASE in the last 168 hours. No results for input(s): AMMONIA in the last 168 hours. Coagulation Profile: Recent Labs  Lab 06/16/19 1656 06/22/19 0356  INR 1.4* 1.3*   Cardiac Enzymes: No results for input(s): CKTOTAL, CKMB, CKMBINDEX, TROPONINI in the last 168 hours. BNP (last 3 results) No results for input(s): PROBNP in the last 8760 hours. HbA1C: No results for input(s): HGBA1C in the last 72 hours. CBG: Recent Labs  Lab 06/22/19 1120 06/22/19 1610 06/22/19 2100 06/23/19 0722 06/23/19 1128    GLUCAP 134* 156* 113* 144* 112*   Lipid Profile: No results for input(s): CHOL, HDL, LDLCALC, TRIG, CHOLHDL, LDLDIRECT in the last 72 hours. Thyroid Function Tests: No results for input(s): TSH, T4TOTAL, FREET4, T3FREE, THYROIDAB in the last 72 hours. Anemia Panel: No results for input(s): VITAMINB12, FOLATE, FERRITIN, TIBC, IRON, RETICCTPCT in the last 72 hours. Sepsis Labs: No results for input(s): PROCALCITON, LATICACIDVEN in the last 168 hours.  Recent Results (from the past 240 hour(s))  Aerobic/Anaerobic Culture (surgical/deep wound)     Status: None   Collection Time: 06/17/19 11:27 AM   Specimen: Abscess  Result Value Ref Range Status   Specimen Description ABSCESS LEFT GLUTEAL  Final   Special Requests Normal  Final   Gram Stain   Final    ABUNDANT WBC PRESENT,BOTH PMN AND MONONUCLEAR ABUNDANT GRAM POSITIVE COCCI    Culture   Final    MODERATE STAPHYLOCOCCUS AUREUS NO ANAEROBES ISOLATED Performed at [facility], [address], [place], [place] [address]    Report Status 06/22/2019 FINAL  Final   Organism ID, Bacteria STAPHYLOCOCCUS AUREUS  Final      Susceptibility   Staphylococcus aureus - MIC*    CIPROFLOXACIN <=0.5 SENSITIVE Sensitive     ERYTHROMYCIN <=0.25 SENSITIVE Sensitive     GENTAMICIN <=0.5 SENSITIVE Sensitive     OXACILLIN 0.5 SENSITIVE Sensitive     TETRACYCLINE >=16 RESISTANT Resistant     VANCOMYCIN 1 SENSITIVE Sensitive     TRIMETH/SULFA <=10 SENSITIVE Sensitive     CLINDAMYCIN <=0.25 SENSITIVE Sensitive     RIFAMPIN <=0.5 SENSITIVE Sensitive     Inducible Clindamycin NEGATIVE Sensitive     * MODERATE STAPHYLOCOCCUS AUREUS     Radiology Studies: No results found.  Scheduled Meds: . [MAR Hold] bisacodyl  10 mg Rectal Once  . [MAR Hold] Chlorhexidine Gluconate Cloth  6 each Topical  Daily  . [MAR Hold] cholecalciferol  2,000 Units Oral Daily  . [MAR Hold] insulin aspart  0-15 Units Subcutaneous TID WC  . [MAR Hold] insulin aspart   0-5 Units Subcutaneous QHS  . [MAR Hold] insulin aspart  8 Units Subcutaneous TID WC  . [MAR Hold] insulin glargine  12 Units Subcutaneous Daily  . [MAR Hold] loratadine  10 mg Oral Daily  . [MAR Hold] multivitamin with minerals  1 tablet Oral Daily  . [MAR Hold] Ensure Max Protein  11 oz Oral Daily  . [MAR Hold] sodium chloride flush  10-40 mL Intracatheter Q12H  . [MAR Hold] sodium chloride flush  5 mL Intracatheter Q8H  . [MAR Hold] vitamin C  1,000 mg Oral Daily   Continuous Infusions: . [MAR Hold]  ceFAZolin (ANCEF) IV 2 g (06/23/19 5974)     LOS: 13 days   Truett Mainland, DO [facility] Pager On Amion  If 7PM-7AM, please contact night-coverage 06/23/2019, 1:03 PM

## 2019-06-23 NOTE — Transfer of Care (Signed)
Immediate Anesthesia Transfer of Care Note  Patient: Robert Sullivan  Procedure(s) Performed: INCISION AND DRAINAGE GLUTEAL ABSCESS (Left )  Patient Location: PACU  Anesthesia Type:General  Level of Consciousness: awake, alert  and oriented  Airway & Oxygen Therapy: Patient Spontanous Breathing and Patient connected to face mask oxygen  Post-op Assessment: Report given to RN and Post -op Vital signs reviewed and stable  Post vital signs: Reviewed and stable  Last Vitals:  Vitals Value Taken Time  BP 108/60 06/23/19 1605  Temp 36.9 C 06/23/19 1605  Pulse 105 06/23/19 1640  Resp 16 06/23/19 1605  SpO2 96 % 06/23/19 1605    Last Pain:  Vitals:   06/23/19 1620  TempSrc:   PainSc: 10-Worst pain ever      Patients Stated Pain Goal: 0 (38/18/29 9371)  Complications: No apparent anesthesia complications

## 2019-06-23 NOTE — Anesthesia Procedure Notes (Signed)
Procedure Name: Intubation Date/Time: 06/23/2019 1:49 PM Performed by: Candis Shine, CRNA Pre-anesthesia Checklist: Patient identified, Emergency Drugs available, Suction available and Patient being monitored Patient Re-evaluated:Patient Re-evaluated prior to induction Oxygen Delivery Method: Circle System Utilized Preoxygenation: Pre-oxygenation with 100% oxygen Induction Type: IV induction Ventilation: Mask ventilation without difficulty Laryngoscope Size: Mac and 4 Grade View: Grade I Tube type: Oral Tube size: 7.5 mm Number of attempts: 1 Airway Equipment and Method: Stylet Placement Confirmation: ETT inserted through vocal cords under direct vision,  positive ETCO2 and breath sounds checked- equal and bilateral Secured at: 23 cm Tube secured with: Tape Dental Injury: Teeth and Oropharynx as per pre-operative assessment

## 2019-06-23 NOTE — Anesthesia Postprocedure Evaluation (Signed)
Anesthesia Post Note  Patient: Robert Sullivan  Procedure(s) Performed: INCISION AND DRAINAGE GLUTEAL ABSCESS (Left )     Patient location during evaluation: PACU Anesthesia Type: General Level of consciousness: awake and alert and oriented Pain management: pain level controlled Vital Signs Assessment: post-procedure vital signs reviewed and stable Respiratory status: spontaneous breathing, nonlabored ventilation and respiratory function stable Cardiovascular status: blood pressure returned to baseline and tachycardic Postop Assessment: no apparent nausea or vomiting Anesthetic complications: no    Last Vitals:  Vitals:   06/23/19 1605 06/23/19 1640  BP: 108/60   Pulse: (!) 113 (!) 105  Resp: 16   Temp: 36.9 C   SpO2: 96%                   Brennan Bailey

## 2019-06-23 NOTE — Progress Notes (Signed)
Nutrition Follow up  DOCUMENTATION CODES:   Morbid obesity  INTERVENTION:    Continue Ensure MAX Protein po daily, each supplement provides 150 kcal and 30 grams of protein (chocolate)  Continue MVI daily   NUTRITION DIAGNOSIS:   Increased nutrient needs related to acute illness as evidenced by estimated needs.  Ongoing  GOAL:   Patient will meet greater than or equal to 90% of their needs   Progressing   MONITOR:   PO intake, Supplement acceptance, Weight trends, Labs, I & O's, Skin  REASON FOR ASSESSMENT:   Malnutrition Screening Tool    ASSESSMENT:   Patient with PMH significant for DM. Recently admitted to [facility] from 11/2-11/7 with E.Coli UTI. Presents this admission with sepsis from possible L gluteal cellulitis.   12/8- s/p IR drain placed L gluteal abscess  Pt in OR for surgical debridement with wound VAC.   Pt reports appetite is stable. Meal completions charted as 100% for his last four meals. Drinking Ensure MAX daily if it's chocolate. Does not like Vanilla. RD to change preferences.   Admission weight: 172.4 kg  Current weight: 169.1 kg   Drips: abx Medications: dulcolax, Vit D, ss novolog, lantus, MVI with minerals, 1000 mg Vit C Labs: CBG 113-156  Diet Order:   Diet Order            Diet NPO time specified Except for: Sips with Meds  Diet effective midnight              EDUCATION NEEDS:   Education needs have been addressed  Skin:  Skin Assessment: Skin Integrity Issues: Skin Integrity Issues:: Other (Comment) Other: MASD- sacrum  Last BM:  12/14  Height:   Ht Readings from Last 1 Encounters:  06/23/19 6' 3"  (1.905 m)    Weight:   Wt Readings from Last 1 Encounters:  06/23/19 (!) 169.1 kg    Ideal Body Weight:  89.1 kg  BMI:  Body mass index is 46.6 kg/m.  Estimated Nutritional Needs:   Kcal:  2400-2600 kcal  Protein:  120-135 grams  Fluid:  >/= 2 L/day   Mariana Single RD, LDN Clinical Nutrition Pager # -  [phone]

## 2019-06-23 NOTE — Progress Notes (Signed)
PT Cancellation Note  Patient Details Name: Robert Sullivan MRN: [id number] DOB: 1966/01/02   Cancelled Treatment:    Reason Eval/Treat Not Completed: Patient declined. Pt scheduled for procedure today. He declined due to fatigue and pain.  Will check back as schedule permits.   Galen Manila 06/23/2019, 10:57 AM

## 2019-06-24 LAB — BASIC METABOLIC PANEL
Anion gap: 6 (ref 5–15)
Anion gap: 5 (ref 5–15)
BUN: 20 mg/dL (ref 6–20)
BUN: 19 mg/dL (ref 6–20)
CO2: 23 mmol/L (ref 22–32)
CO2: 23 mmol/L (ref 22–32)
Calcium: 7.7 mg/dL — ABNORMAL LOW (ref 8.9–10.3)
Calcium: 7.6 mg/dL — ABNORMAL LOW (ref 8.9–10.3)
Chloride: 102 mmol/L (ref 98–111)
Chloride: 102 mmol/L (ref 98–111)
Creatinine, Ser: 0.74 mg/dL (ref 0.61–1.24)
Creatinine, Ser: 0.71 mg/dL (ref 0.61–1.24)
GFR calc Af Amer: >60 mL/min (ref >60)
GFR calc Af Amer: >60 mL/min (ref >60)
GFR calc non Af Amer: >60 mL/min (ref >60)
GFR calc non Af Amer: >60 mL/min (ref >60)
Glucose, Bld: 172 mg/dL — ABNORMAL HIGH (ref 70–99)
Glucose, Bld: 179 mg/dL — ABNORMAL HIGH (ref 70–99)
Potassium: 5.5 mmol/L — ABNORMAL HIGH (ref 3.5–5.1)
Potassium: 4.8 mmol/L (ref 3.5–5.1)
Sodium: 131 mmol/L — ABNORMAL LOW (ref 135–145)
Sodium: 130 mmol/L — ABNORMAL LOW (ref 135–145)

## 2019-06-24 LAB — GLUCOSE, CAPILLARY
Glucose-Capillary: 169 mg/dL — ABNORMAL HIGH (ref 70–99)
Glucose-Capillary: 163 mg/dL — ABNORMAL HIGH (ref 70–99)
Glucose-Capillary: 155 mg/dL — ABNORMAL HIGH (ref 70–99)
Glucose-Capillary: 166 mg/dL — ABNORMAL HIGH (ref 70–99)

## 2019-06-24 LAB — CBC
HCT: 32.7 % — ABNORMAL LOW (ref 39.0–52.0)
Hemoglobin: 10.7 g/dL — ABNORMAL LOW (ref 13.0–17.0)
MCH: 31.3 pg (ref 26.0–34.0)
MCHC: 32.7 g/dL (ref 30.0–36.0)
MCV: 95.6 fL (ref 80.0–100.0)
Platelets: 153 10*3/uL (ref 150–400)
RBC: 3.42 MIL/uL — ABNORMAL LOW (ref 4.22–5.81)
RDW: 16.1 % — ABNORMAL HIGH (ref 11.5–15.5)
WBC: 13.6 10*3/uL — ABNORMAL HIGH (ref 4.0–10.5)
nRBC: 0 % (ref 0.0–0.2)

## 2019-06-24 MED ORDER — INSULIN GLARGINE 100 UNIT/ML ~~LOC~~ SOLN
24.0000 [IU] | Freq: Every day | SUBCUTANEOUS | Status: DC
  Administered 2019-06-24 – 2019-06-29 (×6): 24 [IU] via SUBCUTANEOUS
  Filled 2019-06-24 (×7): qty 0.24

## 2019-06-24 MED ORDER — HYDROMORPHONE HCL 1 MG/ML IJ SOLN
0.5000 mg | INTRAMUSCULAR | Status: DC | PRN
  Administered 2019-06-24 – 2019-06-29 (×14): 0.5 mg via INTRAVENOUS
  Filled 2019-06-24 (×14): qty 1

## 2019-06-24 NOTE — Op Note (Signed)
06/23/2019  7:20 AM  PATIENT:  Robert Sullivan    PRE-OPERATIVE DIAGNOSIS:  Gluteal abscess left  POST-OPERATIVE DIAGNOSIS:  Same  PROCEDURE:  INCISION AND DRAINAGE GLUTEAL ABSCESS  SURGEON:  Renette Butters, MD  ASSISTANT: Roxan Hockey, PA-C   ANESTHESIA:   gen  PREOPERATIVE INDICATIONS:  Robert Sullivan is a  53 y.o. male with a diagnosis of Gluteal abscess left who failed conservative measures and elected for surgical management.    The risks benefits and alternatives were discussed with the patient preoperatively including but not limited to the risks of infection, bleeding, nerve injury, cardiopulmonary complications, the need for revision surgery, among others, and the patient was willing to proceed.  OPERATIVE IMPLANTS: drain x 2   OPERATIVE FINDINGS: significant purulent fluid  BLOOD LOSS: 829  COMPLICATIONS: none  TOURNIQUET TIME: none  OPERATIVE PROCEDURE:  Patient was identified in the preoperative holding area and site was marked by me He was transported to the operating theater and placed on the table in supine position taking care to pad all bony prominences. After a preincinduction time out anesthesia was induced. The left flank extremity was prepped and draped in normal sterile fashion and a pre-incision timeout was performed. He received ancef for preoperative antibiotics.   He was placed in the lateral position padding all bone prominences.   I removed his pigtail drain.  The left flank was prepped and draped in normal sterile fashion.  First I made a 5 cm incision over his greater I dissected down to his IT band.  I then performed a incision of this fascia over his lateral thigh.  Immediately obtained a large amount of purulent fluid.  I expressed all of this.  I debrided his wound of any necrotic tissue.  I did this with a ronjure.  Next I performed a bursectomy of his left greater troches bursa.  Next I turned my attention more proximally over  his gluteal abscess I made a 5 cm incision here dissected down through his adipose tissue and identified his fascia over his gluteus I was able to poke through this with a Cobb and extend proximally and distally.  There was a large amount of purulent fluid here this did connect towards his greater troches bursa abscess.  I used a rondure to debride any devitalized tissue here. Muscle and fascia  I then irrigated both areas of fluid collection with 6 L of saline I placed vancomycin powder I placed a drain into the pockets out the skin from each wound.  Wounds were closed using monofilament stitches sterile dressing was applied  POST OPERATIVE PLAN: Continue care per primary teams and dvt px per primary WBAT LLE, mobilize as able. Likely re-image L pelvis in 1-2 days

## 2019-06-24 NOTE — Progress Notes (Addendum)
Physical Therapy Treatment Patient Details Name: Robert Sullivan MRN: [id number] DOB: 1966-02-04 Today's Date: 06/24/2019    History of Present Illness Pt is a 53 y/o M admitted on 06/10/19 for fevers & chills & increased swelling at hematoma with pt found to have L gluteal abscess & MSSA bacetermia.  P t previously admitted to [facility] on 11/2-11/7 for E. Coli UTI, uncontrolled diabetes, & fall that resulted in a L gluteal hematoma, was treated with IV antibiotics & d/c home. Pt underwent TEE on 06/16/19 that showed no evidence of endocarditis. New I and D on 12/14, will likely have another soon.  PMH significant for morbid obesity, uncontrolled DM2, & arthritis in B knees,    PT Comments    Pt was seen for mobility after he declined OOB initially, but MD was in to ask him to get up today.  Felt like he was getting light headed to be up, so check of vitals was done:  Supine BP 133/72, pulse 103, sat 94%;  sitting BP 136/92, pulse 130, sat 94%.  Pt is unable to attempt standing and will need to get up some stairs to go home.  Will work with him to progress mobility, after I and D yesterday and the expectation that he will have another either tomorrow or Thursday per the MD.  Follow acutely as planned.   Follow Up Recommendations  Home health PT     Equipment Recommendations  Other (comment)(per PT bariatric BSC)    Recommendations for Other Services Rehab consult     Precautions / Restrictions Precautions Precautions: Fall Precaution Comments: L glut drain, IV Restrictions Weight Bearing Restrictions: No    Mobility  Bed Mobility Overal bed mobility: Needs Assistance Bed Mobility: Supine to Sit;Sit to Supine Rolling: Min guard   Supine to sit: Min assist Sit to supine: Min assist   General bed mobility comments: used HOB elevated, bed rail and assisted with LLE a bit to get out and back   Transfers                 General transfer comment: declined to try  Ambulation/Gait                  Stairs             Wheelchair Mobility    Modified Rankin (Stroke Patients Only)       Balance Overall balance assessment: Needs assistance;History of Falls Sitting-balance support: Feet supported Sitting balance-Leahy Scale: Fair                                      Cognition Arousal/Alertness: Awake/alert Behavior During Therapy: Anxious Overall Cognitive Status: Within Functional Limits for tasks assessed                                 General Comments: pt is noting that he is not sure he should move, but physician came by and stated to him to try to get OOB      Exercises      General Comments General comments (skin integrity, edema, etc.): pt was seen for mobility today, declines to try standing but did note his head was feeling off.  BP and pulses, sats were all checked      Pertinent Vitals/Pain Pain Assessment: 0-10 Pain Score: 5  Pain Location: L  hip Pain Descriptors / Indicators: Operative site guarding Pain Intervention(s): Limited activity within patient's tolerance;Monitored during session;Premedicated before session;Repositioned    Home Living                      Prior Function            PT Goals (current goals can now be found in the care plan section) Acute Rehab PT Goals Patient Stated Goal: feel better, not hurt Progress towards PT goals: Progressing toward goals    Frequency    Min 2X/week      PT Plan Current plan remains appropriate    Co-evaluation              AM-PAC PT "6 Clicks" Mobility   Outcome Measure  Help needed turning from your back to your side while in a flat bed without using bedrails?: A Little Help needed moving from lying on your back to sitting on the side of a flat bed without using bedrails?: A Little Help needed moving to and from a bed to a chair (including a wheelchair)?: A Little Help needed standing up from a chair using your  arms (e.g., wheelchair or bedside chair)?: A Little Help needed to walk in hospital room?: A Little Help needed climbing 3-5 steps with a railing? : A Lot 6 Click Score: 17    End of Session Equipment Utilized During Treatment: Gait belt Activity Tolerance: Patient limited by pain;Other (comment)(limited by feeling like head was off) Patient left: in bed;with call bell/phone within reach;with bed alarm set Nurse Communication: Mobility status PT Visit Diagnosis: Other abnormalities of gait and mobility (R26.89);Muscle weakness (generalized) (M62.81);Difficulty in walking, not elsewhere classified (R26.2);Pain Pain - Right/Left: Left Pain - part of body: Hip     Time: 1005-1035 PT Time Calculation (min) (ACUTE ONLY): 30 min  Charges:  $Therapeutic Activity: 23-37 mins                     Robert Sullivan 06/24/2019, 2:20 PM   Robert Sullivan, PT MS Acute Rehab Dept. Number: [place] [facility] [place]

## 2019-06-24 NOTE — Progress Notes (Addendum)
PROGRESS NOTE    Robert Sullivan  UVO:[id number] DOB: Nov 30, 1965 DOA: 06/10/2019 PCP: Cathleen Corti, PA-C    Brief Narrative:  53 y.o. male with history of uncontrolled type 2 diabetes mellitus, morbid obesity was admitted to [facility] from 11/2-11/7 with E. coli UTI, uncontrolled diabetes and a fall that resulted in a left gluteal hematoma, he was treated with IV antibiotics, discharged home on Bactrim, completed course and also on insulin. -He felt well initially after discharge, in the last 7 to 10 days he started having fevers and chills, profuse sweats, 5 to 6 days ago he started having increased pain and swelling at the site of his gluteal hematoma, he also noticed that his urine was darker but denied any dysuria or change in odor presented to the emergency room today with weakness, worsening left hip pain, fevers chills and sweats. -In the ED he was noted to have a sodium of 131, CBG of 380, albumin of 1.8, ALP of 224,  lactic acid of 5.5, white count of 20,000.  In the ED he was noted to have an elevated D-dimer subsequently EDP proceeded with CT angiogram which was negative for pulmonary embolism, his UA was significantly abnormal with cloudy urine, positive nitrite, leukocyte esterase, numerous WBC and bacteria.  In addition noted to have redness and tenderness of his left gluteal region  Assessment & Plan:   Principal Problem:   MSSA bacteremia Active Problems:   Acute lower UTI   Sepsis ([place])   Uncontrolled diabetes mellitus ([place])   Obesity, Class III, BMI 40-49.9 (morbid obesity) (HCC)   Cellulitis, gluteal, left  Sepsis secondary to MSSA bacteremia  Blood cx pos for MSSA, ID now consulted with recommendation for 2d echo to r/o endocarditis, neg findings  TEE performed 12/7 with no evidence of vegetations  currently on ancef   Will need 6wks total IV antibiotics (End date 07/30/19)  Urinary tract infection  Continued on ancef per above  Presenting UA suggestive of UTI,  however, pt denies dysuria or other symptoms  Rpt UA just shows hematuria. Will need outpt follow up after completion of antibiotics  Left gluteal myositis, abscess - MSSA  Per above, pt is continued on ancef  Marked pain with findings suggestive of myositis on CT  f/u MRI findings per above  Pt reports symptoms seem to be improving. Pt better able to ambulate  Appreciate Orthopedics input.   Pt now s/p drain placement by IR 12/7.  Output currently approx 47m in 24 hours.  Rpt CT shows residual abscess measuring 9.5 x 5.7 x 16.6cm with extension anteriorly measuring 7.8 x 1.9 x 15.5cm, extending to muscular fascia.  I&D with washout yesterday - drain in place, put out 3548mover night.  WBC slightly elevated today, which is expected after a washout. Will continue to trend.  Hyperkalemia  Developed POD#1  Rpt potassium to see if real.  EKG  Morbid obesity  BMI of 47  diet/lifestyle modification recommended  Uncontrolled type 2 diabetes mellitus  Hemoglobin A1c was 11 recently  Resume Lantus and NovoLog premeal and sliding scale insulin  Glucose trends stable   Suspected cirrhosis  Noted on imaging, patient denies any alcohol use. Suspect NASH/fatty liver disease is likely the primary culprit  Hep C neg  GI follow up recommended on d/c  DVT prophylaxis: SCDs Code Status: Full Family Communication: Pt in room, family not at bedside Disposition Plan: Uncertain at this time  Consultants:   ID  Cardiology  Orthopedic Surgery  IR  Procedures:   TEE 12/7  IR guided drain placement to L thigh 12/8  Incision and Drainage of Gluteal abscess 06/23/19  Antimicrobials: Anti-infectives (From admission, onward)   Start     Dose/Rate Route Frequency Ordered Stop   06/23/19 1431  vancomycin (VANCOCIN) powder  Status:  Discontinued       As needed 06/23/19 1432 06/23/19 1459   06/20/19 0000  ceFAZolin (ANCEF) IVPB     2 g Intravenous Every 8  hours 06/20/19 1701 07/29/19 2359   06/11/19 2200  ceFAZolin (ANCEF) IVPB 2g/100 mL premix     2 g 200 mL/hr over 30 Minutes Intravenous Every 8 hours 06/11/19 1456     06/11/19 0200  vancomycin (VANCOCIN) 1,750 mg in sodium chloride 0.9 % 500 mL IVPB  Status:  Discontinued     1,750 mg 250 mL/hr over 120 Minutes Intravenous Every 12 hours 06/10/19 1257 06/11/19 1456   06/10/19 2200  ceFEPIme (MAXIPIME) 2 g in sodium chloride 0.9 % 100 mL IVPB  Status:  Discontinued     2 g 200 mL/hr over 30 Minutes Intravenous Every 8 hours 06/10/19 1257 06/10/19 1740   06/10/19 1745  cefTRIAXone (ROCEPHIN) 1 g in sodium chloride 0.9 % 100 mL IVPB  Status:  Discontinued     1 g 200 mL/hr over 30 Minutes Intravenous Every 24 hours 06/10/19 1740 06/11/19 1456   06/10/19 1245  ceFEPIme (MAXIPIME) 2 g in sodium chloride 0.9 % 100 mL IVPB     2 g 200 mL/hr over 30 Minutes Intravenous  Once 06/10/19 1239 06/10/19 1411   06/10/19 1245  vancomycin (VANCOCIN) IVPB 1000 mg/200 mL premix  Status:  Discontinued     1,000 mg 200 mL/hr over 60 Minutes Intravenous  Once 06/10/19 1239 06/10/19 1241   06/10/19 1245  vancomycin (VANCOCIN) 2,500 mg in sodium chloride 0.9 % 500 mL IVPB     2,500 mg 250 mL/hr over 120 Minutes Intravenous  Once 06/10/19 1241 06/10/19 1635      Subjective: Increased pain, as expected.  Objective: Vitals:   06/23/19 1640 06/23/19 1936 06/24/19 0558 06/24/19 0805  BP:  118/69 112/66 (!) 121/59  Pulse: (!) 105 (!) 123 (!) 105 (!) 108  Resp:  12 12 16   Temp:  98.3 F (36.8 C) 99.1 F (37.3 C) 98.2 F (36.8 C)  TempSrc:  Oral Oral Oral  SpO2:  96% 96% 95%  Weight:      Height:        Intake/Output Summary (Last 24 hours) at 06/24/2019 0953 Last data filed at 06/24/2019 1660 Gross per 24 hour  Intake 1410 ml  Output 1660 ml  Net -250 ml   Filed Weights   06/22/19 0541 06/22/19 2059 06/23/19 1327  Weight: (!) 169.1 kg (!) 169.1 kg (!) 169.1 kg    Examination: General  exam: Awake, alert, no acute distress. Respiratory system: Good respiratory effort with no wheezes, rales, rhonchi Cardiovascular system: Regular rate.  Normal S1-S2 sounds. Gastrointestinal system: Soft, nontender, nondistended. Central nervous system: CN2-12 grossly intact, strength intact Extremities: Minimal edema.  Pulses normal. Skin: Drain in left buttock.  Skin around drain normal without erythema.  Petechial rash in right upper arm appears to be improving.   Psychiatry: Mood normal // no visual hallucinations   Data Reviewed: I have personally reviewed following labs and imaging studies  CBC: Recent Labs  Lab 06/20/19 0515 06/21/19 0327 06/22/19 0356 06/23/19 0254 06/24/19 0441  WBC 10.1 10.5 8.5 13.8*  13.6*  NEUTROABS 7.2  --   --   --   --   HGB 11.2* 11.3* 10.6* 11.3* 10.7*  HCT 33.9* 34.8* 31.5* 33.9* 32.7*  MCV 92.6 95.3 93.5 93.6 95.6  PLT 143* 155 133* 142* 789   Basic Metabolic Panel: Recent Labs  Lab 06/19/19 0430 06/20/19 0515 06/21/19 0327 06/22/19 0356 06/23/19 0254 06/24/19 0441  NA 136  --  136 137 135 131*  K 3.9  --  4.4 4.1 4.3 5.5*  CL 104  --  103 104 104 102  CO2 24  --  25 26 23 23   GLUCOSE 133*  --  139* 130* 174* 172*  BUN 14  --  14 12 15 20   CREATININE 0.49*  --  0.51* 0.48* 0.62 0.74  CALCIUM 7.9*  --  7.9* 8.0* 8.0* 7.7*  MG  --  1.9  --   --   --   --   PHOS  --  3.2  --   --   --   --    GFR: Estimated Creatinine Clearance: 180.7 mL/min (by C-G formula based on SCr of 0.74 mg/dL). Liver Function Tests: Recent Labs  Lab 06/18/19 0211  AST 82*  ALT 27  ALKPHOS 300*  BILITOT 1.3*  PROT 6.6  ALBUMIN 1.3*   No results for input(s): LIPASE, AMYLASE in the last 168 hours. No results for input(s): AMMONIA in the last 168 hours. Coagulation Profile: Recent Labs  Lab 06/22/19 0356  INR 1.3*   Cardiac Enzymes: No results for input(s): CKTOTAL, CKMB, CKMBINDEX, TROPONINI in the last 168 hours. BNP (last 3 results) No  results for input(s): PROBNP in the last 8760 hours. HbA1C: No results for input(s): HGBA1C in the last 72 hours. CBG: Recent Labs  Lab 06/23/19 1128 06/23/19 1505 06/23/19 1609 06/23/19 2101 06/24/19 0646  GLUCAP 112* 102* 126* 138* 169*   Lipid Profile: No results for input(s): CHOL, HDL, LDLCALC, TRIG, CHOLHDL, LDLDIRECT in the last 72 hours. Thyroid Function Tests: No results for input(s): TSH, T4TOTAL, FREET4, T3FREE, THYROIDAB in the last 72 hours. Anemia Panel: No results for input(s): VITAMINB12, FOLATE, FERRITIN, TIBC, IRON, RETICCTPCT in the last 72 hours. Sepsis Labs: No results for input(s): PROCALCITON, LATICACIDVEN in the last 168 hours.  Recent Results (from the past 240 hour(s))  Aerobic/Anaerobic Culture (surgical/deep wound)     Status: None   Collection Time: 06/17/19 11:27 AM   Specimen: Abscess  Result Value Ref Range Status   Specimen Description ABSCESS LEFT GLUTEAL  Final   Special Requests Normal  Final   Gram Stain   Final    ABUNDANT WBC PRESENT,BOTH PMN AND MONONUCLEAR ABUNDANT GRAM POSITIVE COCCI    Culture   Final    MODERATE STAPHYLOCOCCUS AUREUS NO ANAEROBES ISOLATED Performed at [facility], [address] [place],  [address]    Report Status 06/22/2019 FINAL  Final   Organism ID, Bacteria STAPHYLOCOCCUS AUREUS  Final      Susceptibility   Staphylococcus aureus - MIC*    CIPROFLOXACIN <=0.5 SENSITIVE Sensitive     ERYTHROMYCIN <=0.25 SENSITIVE Sensitive     GENTAMICIN <=0.5 SENSITIVE Sensitive     OXACILLIN 0.5 SENSITIVE Sensitive     TETRACYCLINE >=16 RESISTANT Resistant     VANCOMYCIN 1 SENSITIVE Sensitive     TRIMETH/SULFA <=10 SENSITIVE Sensitive     CLINDAMYCIN <=0.25 SENSITIVE Sensitive     RIFAMPIN <=0.5 SENSITIVE Sensitive     Inducible Clindamycin NEGATIVE Sensitive     *  MODERATE STAPHYLOCOCCUS AUREUS     Radiology Studies: No results found.  Scheduled Meds: . bisacodyl  10 mg Rectal Once  .  Chlorhexidine Gluconate Cloth  6 each Topical Daily  . cholecalciferol  2,000 Units Oral Daily  . insulin aspart  0-15 Units Subcutaneous TID WC  . insulin aspart  0-5 Units Subcutaneous QHS  . insulin aspart  8 Units Subcutaneous TID WC  . insulin glargine  12 Units Subcutaneous Daily  . loratadine  10 mg Oral Daily  . multivitamin with minerals  1 tablet Oral Daily  . Ensure Max Protein  11 oz Oral Daily  . sodium chloride flush  10-40 mL Intracatheter Q12H  . sodium chloride flush  5 mL Intracatheter Q8H  . vitamin C  1,000 mg Oral Daily   Continuous Infusions: .  ceFAZolin (ANCEF) IV 2 g (06/24/19 6644)     LOS: 14 days   Truett Mainland, DO [facility] Pager On Amion  If 7PM-7AM, please contact night-coverage 06/24/2019, 9:53 AM

## 2019-06-25 ENCOUNTER — Inpatient Hospital Stay (HOSPITAL_COMMUNITY): Payer: Self-pay | Admitting: Certified Registered"

## 2019-06-25 ENCOUNTER — Encounter (HOSPITAL_COMMUNITY)
Admission: EM | Disposition: A | Discharge status: Home Health | Payer: Self-pay | Source: Home / Self Care | Attending: Internal Medicine

## 2019-06-25 ENCOUNTER — Encounter (HOSPITAL_COMMUNITY): Payer: Self-pay | Admitting: Internal Medicine

## 2019-06-25 HISTORY — PX: INCISION AND DRAINAGE HIP: SHX1801

## 2019-06-25 LAB — CBC
HCT: 28.5 % — ABNORMAL LOW (ref 39.0–52.0)
Hemoglobin: 9.3 g/dL — ABNORMAL LOW (ref 13.0–17.0)
MCH: 31.3 pg (ref 26.0–34.0)
MCHC: 32.6 g/dL (ref 30.0–36.0)
MCV: 96 fL (ref 80.0–100.0)
Platelets: 116 10*3/uL — ABNORMAL LOW (ref 150–400)
RBC: 2.97 MIL/uL — ABNORMAL LOW (ref 4.22–5.81)
RDW: 15.9 % — ABNORMAL HIGH (ref 11.5–15.5)
WBC: 7.2 10*3/uL (ref 4.0–10.5)
nRBC: 0 % (ref 0.0–0.2)

## 2019-06-25 LAB — BASIC METABOLIC PANEL
Anion gap: 5 (ref 5–15)
BUN: 16 mg/dL (ref 6–20)
CO2: 25 mmol/L (ref 22–32)
Calcium: 7.8 mg/dL — ABNORMAL LOW (ref 8.9–10.3)
Chloride: 103 mmol/L (ref 98–111)
Creatinine, Ser: 0.66 mg/dL (ref 0.61–1.24)
GFR calc Af Amer: >60 mL/min (ref >60)
GFR calc non Af Amer: >60 mL/min (ref >60)
Glucose, Bld: 164 mg/dL — ABNORMAL HIGH (ref 70–99)
Potassium: 4.2 mmol/L (ref 3.5–5.1)
Sodium: 133 mmol/L — ABNORMAL LOW (ref 135–145)

## 2019-06-25 LAB — GLUCOSE, CAPILLARY
Glucose-Capillary: 131 mg/dL — ABNORMAL HIGH (ref 70–99)
Glucose-Capillary: 123 mg/dL — ABNORMAL HIGH (ref 70–99)
Glucose-Capillary: 125 mg/dL — ABNORMAL HIGH (ref 70–99)
Glucose-Capillary: 144 mg/dL — ABNORMAL HIGH (ref 70–99)
Glucose-Capillary: 163 mg/dL — ABNORMAL HIGH (ref 70–99)
Glucose-Capillary: 261 mg/dL — ABNORMAL HIGH (ref 70–99)

## 2019-06-25 SURGERY — IRRIGATION AND DEBRIDEMENT HIP
Anesthesia: General | Site: Hip | Laterality: Left

## 2019-06-25 MED ORDER — ONDANSETRON HCL 4 MG/2ML IJ SOLN
INTRAMUSCULAR | Status: DC | PRN
  Administered 2019-06-25: 4 mg via INTRAVENOUS

## 2019-06-25 MED ORDER — GLYCOPYRROLATE PF 0.2 MG/ML IJ SOSY
PREFILLED_SYRINGE | INTRAMUSCULAR | Status: AC
  Filled 2019-06-25: qty 1

## 2019-06-25 MED ORDER — HYDROMORPHONE HCL 1 MG/ML IJ SOLN
0.5000 mg | INTRAMUSCULAR | Status: DC | PRN
  Administered 2019-06-25: 0.5 mg via INTRAVENOUS

## 2019-06-25 MED ORDER — TOBRAMYCIN SULFATE 1.2 G IJ SOLR
INTRAMUSCULAR | Status: AC
  Filled 2019-06-25: qty 1.2

## 2019-06-25 MED ORDER — FENTANYL CITRATE (PF) 250 MCG/5ML IJ SOLN
INTRAMUSCULAR | Status: AC
  Filled 2019-06-25: qty 5

## 2019-06-25 MED ORDER — HYDROMORPHONE HCL 1 MG/ML IJ SOLN
INTRAMUSCULAR | Status: AC
  Administered 2019-06-25: 17:00:00 0.5 mg via INTRAVENOUS
  Filled 2019-06-25: qty 1

## 2019-06-25 MED ORDER — SUCCINYLCHOLINE CHLORIDE 200 MG/10ML IV SOSY
PREFILLED_SYRINGE | INTRAVENOUS | Status: AC
  Filled 2019-06-25: qty 10

## 2019-06-25 MED ORDER — MIDAZOLAM HCL 2 MG/2ML IJ SOLN
INTRAMUSCULAR | Status: AC
  Filled 2019-06-25: qty 2

## 2019-06-25 MED ORDER — MIDAZOLAM HCL 5 MG/5ML IJ SOLN
INTRAMUSCULAR | Status: DC | PRN
  Administered 2019-06-25: 2 mg via INTRAVENOUS

## 2019-06-25 MED ORDER — FENTANYL CITRATE (PF) 100 MCG/2ML IJ SOLN
INTRAMUSCULAR | Status: DC | PRN
  Administered 2019-06-25 (×5): 50 ug via INTRAVENOUS

## 2019-06-25 MED ORDER — ROCURONIUM BROMIDE 10 MG/ML (PF) SYRINGE
PREFILLED_SYRINGE | INTRAVENOUS | Status: AC
  Filled 2019-06-25: qty 10

## 2019-06-25 MED ORDER — PROPOFOL 1000 MG/100ML IV EMUL
INTRAVENOUS | Status: AC
  Filled 2019-06-25: qty 100

## 2019-06-25 MED ORDER — TOBRAMYCIN SULFATE 1.2 G IJ SOLR
INTRAMUSCULAR | Status: DC | PRN
  Administered 2019-06-25: 1.2 g

## 2019-06-25 MED ORDER — SODIUM CHLORIDE 0.9 % IR SOLN
Status: DC | PRN
  Administered 2019-06-25 (×2): 3000 mL

## 2019-06-25 MED ORDER — ONDANSETRON HCL 4 MG/2ML IJ SOLN: 4.0000 mg | Freq: Four times a day (QID) | INTRAMUSCULAR | Status: DC | PRN

## 2019-06-25 MED ORDER — ONDANSETRON HCL 4 MG PO TABS: 4.0000 mg | ORAL_TABLET | Freq: Four times a day (QID) | ORAL | Status: DC | PRN

## 2019-06-25 MED ORDER — LIDOCAINE 2% (20 MG/ML) 5 ML SYRINGE
INTRAMUSCULAR | Status: AC
  Filled 2019-06-25: qty 5

## 2019-06-25 MED ORDER — PROPOFOL 10 MG/ML IV BOLUS
INTRAVENOUS | Status: AC
  Filled 2019-06-25: qty 20

## 2019-06-25 MED ORDER — GLYCOPYRROLATE PF 0.2 MG/ML IJ SOSY
PREFILLED_SYRINGE | INTRAMUSCULAR | Status: DC | PRN
  Administered 2019-06-25: .1 mg via INTRAVENOUS

## 2019-06-25 MED ORDER — ALBUMIN HUMAN 5 % IV SOLN: INTRAVENOUS | Status: DC | PRN

## 2019-06-25 MED ORDER — SUCCINYLCHOLINE CHLORIDE 200 MG/10ML IV SOSY
PREFILLED_SYRINGE | INTRAVENOUS | Status: DC | PRN
  Administered 2019-06-25: 100 mg via INTRAVENOUS

## 2019-06-25 MED ORDER — DEXAMETHASONE SODIUM PHOSPHATE 10 MG/ML IJ SOLN
INTRAMUSCULAR | Status: DC | PRN
  Administered 2019-06-25: 4 mg via INTRAVENOUS

## 2019-06-25 MED ORDER — PHENYLEPHRINE 40 MCG/ML (10ML) SYRINGE FOR IV PUSH (FOR BLOOD PRESSURE SUPPORT)
PREFILLED_SYRINGE | INTRAVENOUS | Status: AC
  Filled 2019-06-25: qty 10

## 2019-06-25 MED ORDER — LACTATED RINGERS IV SOLN: INTRAVENOUS | Status: DC

## 2019-06-25 MED ORDER — DEXAMETHASONE SODIUM PHOSPHATE 10 MG/ML IJ SOLN
INTRAMUSCULAR | Status: AC
  Filled 2019-06-25: qty 1

## 2019-06-25 MED ORDER — SUGAMMADEX SODIUM 500 MG/5ML IV SOLN
INTRAVENOUS | Status: AC
  Filled 2019-06-25: qty 5

## 2019-06-25 MED ORDER — VANCOMYCIN HCL 1000 MG IV SOLR
INTRAVENOUS | Status: DC | PRN
  Administered 2019-06-25: 1000 mg

## 2019-06-25 MED ORDER — DOCUSATE SODIUM 100 MG PO CAPS
100.0000 mg | ORAL_CAPSULE | Freq: Two times a day (BID) | ORAL | Status: DC
  Administered 2019-06-26 – 2019-06-29 (×3): 100 mg via ORAL
  Filled 2019-06-25 (×9): qty 1

## 2019-06-25 MED ORDER — 0.9 % SODIUM CHLORIDE (POUR BTL) OPTIME
TOPICAL | Status: DC | PRN
  Administered 2019-06-25: 1000 mL

## 2019-06-25 MED ORDER — PROPOFOL 10 MG/ML IV BOLUS
INTRAVENOUS | Status: DC | PRN
  Administered 2019-06-25: 150 mg via INTRAVENOUS

## 2019-06-25 MED ORDER — PROMETHAZINE HCL 25 MG/ML IJ SOLN: 6.2500 mg | INTRAMUSCULAR | Status: DC | PRN

## 2019-06-25 MED ORDER — SUGAMMADEX SODIUM 200 MG/2ML IV SOLN
INTRAVENOUS | Status: DC | PRN
  Administered 2019-06-25: 400 mg via INTRAVENOUS

## 2019-06-25 MED ORDER — VANCOMYCIN HCL 1000 MG IV SOLR
INTRAVENOUS | Status: AC
  Filled 2019-06-25: qty 1000

## 2019-06-25 MED ORDER — ROCURONIUM BROMIDE 50 MG/5ML IV SOSY
PREFILLED_SYRINGE | INTRAVENOUS | Status: DC | PRN
  Administered 2019-06-25: 50 mg via INTRAVENOUS
  Administered 2019-06-25: 10 mg via INTRAVENOUS

## 2019-06-25 MED ORDER — LIDOCAINE 2% (20 MG/ML) 5 ML SYRINGE
INTRAMUSCULAR | Status: DC | PRN
  Administered 2019-06-25: 60 mg via INTRAVENOUS

## 2019-06-25 MED ORDER — ONDANSETRON HCL 4 MG/2ML IJ SOLN
INTRAMUSCULAR | Status: AC
  Filled 2019-06-25: qty 2

## 2019-06-25 MED ORDER — FENTANYL CITRATE (PF) 100 MCG/2ML IJ SOLN: 25.0000 ug | INTRAMUSCULAR | Status: DC | PRN

## 2019-06-25 SURGICAL SUPPLY — 56 items
BNDG COHESIVE 4X5 TAN STRL (GAUZE/BANDAGES/DRESSINGS) ×3 IMPLANT
BNDG COHESIVE 6X5 TAN STRL LF (GAUZE/BANDAGES/DRESSINGS) ×2 IMPLANT
BNDG GAUZE ELAST 4 BULKY (GAUZE/BANDAGES/DRESSINGS) ×6 IMPLANT
BRUSH SCRUB EZ PLAIN DRY (MISCELLANEOUS) ×6 IMPLANT
CANISTER WOUNDNEG PRESSURE 500 (CANNISTER) ×2 IMPLANT
CHLORAPREP W/TINT 26 (MISCELLANEOUS) ×3 IMPLANT
COVER MAYO STAND STRL (DRAPES) ×3 IMPLANT
COVER SURGICAL LIGHT HANDLE (MISCELLANEOUS) ×6 IMPLANT
COVER WAND RF STERILE (DRAPES) ×3 IMPLANT
DRAPE IMP U-DRAPE 54X76 (DRAPES) ×2 IMPLANT
DRAPE INCISE IOBAN 66X45 STRL (DRAPES) ×2 IMPLANT
DRAPE ORTHO SPLIT 77X108 STRL (DRAPES) ×2
DRAPE SURG 17X23 STRL (DRAPES) ×3 IMPLANT
DRAPE SURG ORHT 6 SPLT 77X108 (DRAPES) ×1 IMPLANT
DRAPE U-SHAPE 47X51 STRL (DRAPES) ×3 IMPLANT
DRSG ADAPTIC 3X8 NADH LF (GAUZE/BANDAGES/DRESSINGS) ×3 IMPLANT
ELECT REM PT RETURN 9FT ADLT (ELECTROSURGICAL)
ELECTRODE REM PT RTRN 9FT ADLT (ELECTROSURGICAL) IMPLANT
EVACUATOR 1/8 PVC DRAIN (DRAIN) IMPLANT
GAUZE SPONGE 4X4 12PLY STRL (GAUZE/BANDAGES/DRESSINGS) ×3 IMPLANT
GAUZE SPONGE 4X4 12PLY STRL LF (GAUZE/BANDAGES/DRESSINGS) ×2 IMPLANT
GLOVE BIO SURGEON STRL SZ 6.5 (GLOVE) ×6 IMPLANT
GLOVE BIO SURGEON STRL SZ7.5 (GLOVE) ×12 IMPLANT
GLOVE BIO SURGEONS STRL SZ 6.5 (GLOVE) ×3
GLOVE BIOGEL PI IND STRL 6.5 (GLOVE) ×1 IMPLANT
GLOVE BIOGEL PI IND STRL 7.5 (GLOVE) ×1 IMPLANT
GLOVE BIOGEL PI INDICATOR 6.5 (GLOVE) ×2
GLOVE BIOGEL PI INDICATOR 7.5 (GLOVE) ×2
GOWN STRL REUS W/ TWL LRG LVL3 (GOWN DISPOSABLE) ×2 IMPLANT
GOWN STRL REUS W/TWL LRG LVL3 (GOWN DISPOSABLE) ×4
HANDPIECE INTERPULSE COAX TIP (DISPOSABLE)
KIT BASIN OR (CUSTOM PROCEDURE TRAY) ×3 IMPLANT
KIT PREVENA INCISION MGT20CM45 (CANNISTER) ×2 IMPLANT
KIT TURNOVER KIT B (KITS) ×3 IMPLANT
MANIFOLD NEPTUNE II (INSTRUMENTS) ×3 IMPLANT
NS IRRIG 1000ML POUR BTL (IV SOLUTION) ×3 IMPLANT
PACK ORTHO EXTREMITY (CUSTOM PROCEDURE TRAY) ×3 IMPLANT
PAD ARMBOARD 7.5X6 YLW CONV (MISCELLANEOUS) ×6 IMPLANT
PADDING CAST COTTON 6X4 STRL (CAST SUPPLIES) ×3 IMPLANT
SET HNDPC FAN SPRY TIP SCT (DISPOSABLE) IMPLANT
SPONGE LAP 18X18 RF (DISPOSABLE) ×3 IMPLANT
SPONGE LAP 18X18 X RAY DECT (DISPOSABLE) ×2 IMPLANT
STOCKINETTE IMPERVIOUS LG (DRAPES) ×2 IMPLANT
SUT ETHILON 2 0 FS 18 (SUTURE) ×12 IMPLANT
SUT ETHILON 3 0 PS 1 (SUTURE) ×6 IMPLANT
SUT MON AB 2-0 CT1 36 (SUTURE) ×5 IMPLANT
SUT PDS AB 0 CT 36 (SUTURE) ×2 IMPLANT
SWAB CULTURE ESWAB REG 1ML (MISCELLANEOUS) IMPLANT
TAPE CLOTH SURG 4X10 WHT LF (GAUZE/BANDAGES/DRESSINGS) ×2 IMPLANT
TOWEL GREEN STERILE (TOWEL DISPOSABLE) ×6 IMPLANT
TOWEL GREEN STERILE FF (TOWEL DISPOSABLE) ×3 IMPLANT
TUBE CONNECTING 12'X1/4 (SUCTIONS) ×1
TUBE CONNECTING 12X1/4 (SUCTIONS) ×2 IMPLANT
UNDERPAD 30X30 (UNDERPADS AND DIAPERS) ×3 IMPLANT
WATER STERILE IRR 1000ML POUR (IV SOLUTION) ×3 IMPLANT
YANKAUER SUCT BULB TIP NO VENT (SUCTIONS) ×5 IMPLANT

## 2019-06-25 NOTE — Anesthesia Procedure Notes (Signed)
Procedure Name: Intubation Date/Time: 06/25/2019 2:17 PM Performed by: Orlie Dakin, CRNA Pre-anesthesia Checklist: Patient identified, Emergency Drugs available, Suction available and Patient being monitored Patient Re-evaluated:Patient Re-evaluated prior to induction Oxygen Delivery Method: Circle system utilized Preoxygenation: Pre-oxygenation with 100% oxygen Induction Type: IV induction and Rapid sequence Laryngoscope Size: Mac and 4 Grade View: Grade I Tube type: Oral Tube size: 7.5 mm Number of attempts: 1 Airway Equipment and Method: Stylet Placement Confirmation: ETT inserted through vocal cords under direct vision,  positive ETCO2 and breath sounds checked- equal and bilateral Secured at: 23 cm Tube secured with: Tape Dental Injury: Teeth and Oropharynx as per pre-operative assessment  Comments: 4x4s bite block used.

## 2019-06-25 NOTE — Anesthesia Preprocedure Evaluation (Addendum)
Anesthesia Evaluation  Patient identified by MRN, date of birth, ID band Patient awake    Reviewed: Allergy & Precautions, NPO status , Patient's Chart, lab work & pertinent test results  Airway Mallampati: II  TM Distance: >3 FB Neck ROM: Full    Dental  (+) Dental Advisory Given, Chipped,    Pulmonary sleep apnea ,    Pulmonary exam normal breath sounds clear to auscultation       Cardiovascular negative cardio ROS Normal cardiovascular exam Rhythm:Regular Rate:Normal     Neuro/Psych  Headaches,    GI/Hepatic negative GI ROS, Neg liver ROS,   Endo/Other  diabetes, Poorly Controlled, Type 2, Insulin DependentMorbid obesity  Renal/GU negative Renal ROS     Musculoskeletal  (+) Arthritis , Left buttock abscess   Abdominal   Peds  Hematology  (+) Blood dyscrasia (Thrombocytopenia), anemia ,   Anesthesia Other Findings Day of surgery medications reviewed with the patient.  Reproductive/Obstetrics                            Anesthesia Physical Anesthesia Plan  ASA: III  Anesthesia Plan: General   Post-op Pain Management:    Induction: Intravenous  PONV Risk Score and Plan: 2 and Midazolam, Dexamethasone and Ondansetron  Airway Management Planned: Oral ETT  Additional Equipment:   Intra-op Plan:   Post-operative Plan: Extubation in OR  Informed Consent: I have reviewed the patients History and Physical, chart, labs and discussed the procedure including the risks, benefits and alternatives for the proposed anesthesia with the patient or authorized representative who has indicated his/her understanding and acceptance.     Dental advisory given  Plan Discussed with: CRNA  Anesthesia Plan Comments:         Anesthesia Quick Evaluation

## 2019-06-25 NOTE — Plan of Care (Signed)
  Problem: Activity: Goal: Risk for activity intolerance will decrease Outcome: Progressing   

## 2019-06-25 NOTE — Transfer of Care (Signed)
Immediate Anesthesia Transfer of Care Note  Patient: Robert Sullivan  Procedure(s) Performed: IRRIGATION AND DEBRIDEMENT HIP (Left Hip)  Patient Location: PACU  Anesthesia Type:General  Level of Consciousness: awake and patient cooperative  Airway & Oxygen Therapy: Patient Spontanous Breathing and Patient connected to face mask oxygen  Post-op Assessment: Report given to RN and Post -op Vital signs reviewed and stable  Post vital signs: Reviewed and stable  Last Vitals:  Vitals Value Taken Time  BP    Temp    Pulse 114 06/25/19 1624  Resp 13 06/25/19 1624  SpO2 100 % 06/25/19 1624  Vitals shown include unvalidated device data.  Last Pain:  Vitals:   06/25/19 1202  TempSrc: Oral  PainSc:       Patients Stated Pain Goal: 1 (75/19/82 4299)  Complications: No apparent anesthesia complications

## 2019-06-25 NOTE — Consult Note (Addendum)
Orthopaedic Trauma Service (OTS) Consult   Patient ID: Robert Sullivan MRN: [id number] DOB/AGE: [birth year] 52 y.o.  Reason for Consult:Left hip abscess Referring Physician: Dr. Fredonia Highland, MD [facility]  HPI: Robert Sullivan is an 53 y.o. male who is being seen in consultation at request of Dr. Percell Miller for evaluation of left gluteal abscess.  The patient presented in sepsis and was subsequently found to have a large with left gluteal abscess.  He underwent drain placement with interventional radiology but unfortunately his pain continued and he continued to have significant source of infection.  He eventually went to the operating room with Dr. Percell Miller on Monday, 06/23/2019 for incision and drainage of the left abscess.  Since that time he states that his pain has been much improved.  He does not have the throbbing discomfort that he was having before.  Due to the complexity of his abscess and the location Dr. Percell Miller felt that an orthopedic traumatologist would be best served to perform a repeat irrigation debridement to make sure that all of the purulence is decompressed.  Patient was seen on [place].  He is resting comfortably.  Notes pain in his leg.  Also notes some increased weight gain that he is unsure of where this is coming from heat.  He states that he has had a decreased appetite.  Denies any fevers or chills.  Denies any numbness or tingling in his lower extremity.  Denies any feelings of pressure or pain distal to the thigh.  Denies any pain anywhere else in his right lower extremity or bilateral upper extremities.  Past Medical History:  Diagnosis Date  . Arthritis 05/13/2019   knees  . Diabetes mellitus without complication ([place])   . Headache   . Sleep apnea   . Sleep apnea     Past Surgical History:  Procedure Laterality Date  . INCISION AND DRAINAGE ABSCESS Left 06/23/2019   Procedure: INCISION AND DRAINAGE GLUTEAL ABSCESS;  Surgeon: Renette Butters, MD;   Location: [facility];  Service: Orthopedics;  Laterality: Left;  . IR US GUIDE BX ASP/DRAIN  06/17/2019  . TEE WITHOUT CARDIOVERSION N/A 06/16/2019   Procedure: TRANSESOPHAGEAL ECHOCARDIOGRAM (TEE);  Surgeon: Dorothy Spark, MD;  Location: [facility] ENDOSCOPY;  Service: Cardiovascular;  Laterality: N/A;    History reviewed. No pertinent family history.  Social History: Denies tobacco use, alcohol use and drug use.  Allergies:  Allergies  Allergen Reactions  . Sulfa Antibiotics Rash    Total body rash    Medications:  No current facility-administered medications on file prior to encounter.   Current Outpatient Medications on File Prior to Encounter  Medication Sig Dispense Refill  . cholecalciferol (VITAMIN D3) 25 MCG (1000 UT) tablet Take 2,000 Units by mouth daily.    . insulin aspart (NOVOLOG) 100 UNIT/ML injection Inject 9 Units into the skin 3 (three) times daily with meals. (Patient taking differently: Inject 13 Units into the skin 2 (two) times daily. ) 10 mL 11  . vitamin C (ASCORBIC ACID) 500 MG tablet Take 1,000 mg by mouth daily.    . insulin glargine (LANTUS) 100 UNIT/ML injection Inject 0.24 mLs (24 Units total) into the skin daily. (Patient not taking: Reported on 06/10/2019) 10 mL 11    ROS: Constitutional: No fever or chills Vision: No changes in vision ENT: No difficulty swallowing CV: No chest pain Pulm: No SOB or wheezing GI: No nausea or vomiting GU: No urgency or inability to hold urine Skin: No poor wound  healing Neurologic: No numbness or tingling Psychiatric: No depression or anxiety Heme: No bruising Allergic: No reaction to medications or food   Exam: Blood pressure (!) 110/56, pulse 83, temperature (!) 100.5 F (38.1 C), temperature source Oral, resp. rate 18, height 6' 3"  (1.905 m), weight (!) 171.3 kg, SpO2 97 %. General: No acute distress Orientation: Awake alert and oriented x3 Mood and Affect: Cooperative and pleasant Gait: Unable to assess due to  his immobile state. Coordination and balance: Within normal limits  Left lower extremity: Reveals dressing is in place over the thigh.  There is a Hemovac drain in place with serosanguineous fluid in it.  He has global tenderness about the thigh and hip.  He has limited range of motion secondary to pain.  No fluctuance and no tenderness to the distal thigh.  He is neurovascularly intact distally with warm well perfused foot.  Gentle hip and knee range of motion shows some discomfort.  Right lower extremity: Skin without lesions. No tenderness to palpation. Full painless ROM, full strength in each muscle groups without evidence of instability.   Medical Decision Making: Data: Imaging: CT scan of his pelvis was reviewed which shows a large loculated abscess in the gluteal musculature that extends down beneath the IT band to his greater trochanter.  Labs:  Results for orders placed or performed during the hospital encounter of 06/10/19 (from the past 48 hour(s))  Glucose, capillary     Status: Abnormal   Collection Time: 06/23/19 11:28 AM  Result Value Ref Range   Glucose-Capillary 112 (H) 70 - 99 mg/dL  Glucose, capillary     Status: Abnormal   Collection Time: 06/23/19  3:05 PM  Result Value Ref Range   Glucose-Capillary 102 (H) 70 - 99 mg/dL   Comment 1 Notify RN    Comment 2 Document in Chart   Glucose, capillary     Status: Abnormal   Collection Time: 06/23/19  4:09 PM  Result Value Ref Range   Glucose-Capillary 126 (H) 70 - 99 mg/dL  Glucose, capillary     Status: Abnormal   Collection Time: 06/23/19  9:01 PM  Result Value Ref Range   Glucose-Capillary 138 (H) 70 - 99 mg/dL  CBC     Status: Abnormal   Collection Time: 06/24/19  4:41 AM  Result Value Ref Range   WBC 13.6 (H) 4.0 - 10.5 K/uL   RBC 3.42 (L) 4.22 - 5.81 MIL/uL   Hemoglobin 10.7 (L) 13.0 - 17.0 g/dL   HCT 32.7 (L) 39.0 - 52.0 %   MCV 95.6 80.0 - 100.0 fL   MCH 31.3 26.0 - 34.0 pg   MCHC 32.7 30.0 - 36.0 g/dL    RDW 16.1 (H) 11.5 - 15.5 %   Platelets 153 150 - 400 K/uL   nRBC 0.0 0.0 - 0.2 %    Comment: Performed at [facility], [address], [place], [place] [address]  Basic metabolic panel     Status: Abnormal   Collection Time: 06/24/19  4:41 AM  Result Value Ref Range   Sodium 131 (L) 135 - 145 mmol/L   Potassium 5.5 (H) 3.5 - 5.1 mmol/L   Chloride 102 98 - 111 mmol/L   CO2 23 22 - 32 mmol/L   Glucose, Bld 172 (H) 70 - 99 mg/dL   BUN 20 6 - 20 mg/dL   Creatinine, Ser 0.74 0.61 - 1.24 mg/dL   Calcium 7.7 (L) 8.9 - 10.3 mg/dL   GFR  calc non Af Amer >60 >60 mL/min   GFR calc Af Amer >60 >60 mL/min   Anion gap 6 5 - 15    Comment: Performed at [facility] [address], [place], [place] [address]  Glucose, capillary     Status: Abnormal   Collection Time: 06/24/19  6:46 AM  Result Value Ref Range   Glucose-Capillary 169 (H) 70 - 99 mg/dL  Basic metabolic panel     Status: Abnormal   Collection Time: 06/24/19 10:40 AM  Result Value Ref Range   Sodium 130 (L) 135 - 145 mmol/L   Potassium 4.8 3.5 - 5.1 mmol/L   Chloride 102 98 - 111 mmol/L   CO2 23 22 - 32 mmol/L   Glucose, Bld 179 (H) 70 - 99 mg/dL   BUN 19 6 - 20 mg/dL   Creatinine, Ser 0.71 0.61 - 1.24 mg/dL   Calcium 7.6 (L) 8.9 - 10.3 mg/dL   GFR calc non Af Amer >60 >60 mL/min   GFR calc Af Amer >60 >60 mL/min   Anion gap 5 5 - 15    Comment: Performed at [facility], [place] [address] [place], [place] [address]  Glucose, capillary     Status: Abnormal   Collection Time: 06/24/19 11:33 AM  Result Value Ref Range   Glucose-Capillary 163 (H) 70 - 99 mg/dL  Glucose, capillary     Status: Abnormal   Collection Time: 06/24/19  4:23 PM  Result Value Ref Range   Glucose-Capillary 155 (H) 70 - 99 mg/dL  Glucose, capillary     Status: Abnormal   Collection Time: 06/24/19  8:26 PM  Result Value Ref Range   Glucose-Capillary 166 (H) 70 - 99 mg/dL  CBC     Status: Abnormal   Collection Time: 06/25/19  3:51 AM   Result Value Ref Range   WBC 7.2 4.0 - 10.5 K/uL   RBC 2.97 (L) 4.22 - 5.81 MIL/uL   Hemoglobin 9.3 (L) 13.0 - 17.0 g/dL   HCT 28.5 (L) 39.0 - 52.0 %   MCV 96.0 80.0 - 100.0 fL   MCH 31.3 26.0 - 34.0 pg   MCHC 32.6 30.0 - 36.0 g/dL   RDW 15.9 (H) 11.5 - 15.5 %   Platelets 116 (L) 150 - 400 K/uL    Comment: REPEATED TO VERIFY PLATELET COUNT CONFIRMED BY SMEAR SPECIMEN CHECKED FOR CLOTS Immature Platelet Fraction may be clinically indicated, consider ordering this additional test [id number]    nRBC 0.0 0.0 - 0.2 %    Comment: Performed at [facility], [place] [address], [place], [place] [address]  Basic metabolic panel     Status: Abnormal   Collection Time: 06/25/19  3:51 AM  Result Value Ref Range   Sodium 133 (L) 135 - 145 mmol/L   Potassium 4.2 3.5 - 5.1 mmol/L   Chloride 103 98 - 111 mmol/L   CO2 25 22 - 32 mmol/L   Glucose, Bld 164 (H) 70 - 99 mg/dL   BUN 16 6 - 20 mg/dL   Creatinine, Ser 0.66 0.61 - 1.24 mg/dL   Calcium 7.8 (L) 8.9 - 10.3 mg/dL   GFR calc non Af Amer >60 >60 mL/min   GFR calc Af Amer >60 >60 mL/min   Anion gap 5 5 - 15    Comment: Performed at [facility], [place] [address], [place], [place] [address]  Glucose, capillary     Status: Abnormal  Collection Time: 06/25/19  6:58 AM  Result Value Ref Range   Glucose-Capillary 131 (H) 70 - 99 mg/dL    Imaging or Labs ordered: No new imaging or labs obtained.  Medical history and chart was reviewed and case discussed with medical provider.  Assessment/Plan: 53 year old male with a history of diabetes with a large complex gluteal abscess status post initial incision and drainage by Dr. Percell Miller.  Due to the complexity of his abscess in the location a repeat irrigation and debridement is warranted.  I have discussed the risks and benefits with the patient and he agrees to proceed with surgery consent will be obtained.  Hopefully this will be the final surgery.  We will likely close his a wound over a  drain to prevent any recollection of the pus.  Shona Needles, MD [facility] [phone] (office) [email or web address]

## 2019-06-25 NOTE — Progress Notes (Addendum)
PROGRESS NOTE    Robert Sullivan  HLK:[id number] DOB: 15-Sep-1965 DOA: 06/10/2019 PCP: Cathleen Corti, PA-C    Brief Narrative:  53 y.o. male with history of uncontrolled type 2 diabetes mellitus, morbid obesity was admitted to [facility] from 11/2-11/7 with E. coli UTI, uncontrolled diabetes and a fall that resulted in a left gluteal hematoma, he was treated with IV antibiotics, discharged home on Bactrim, completed course and also on insulin, presented with several days history of fever, chills, increased pain and swelling at site of left gluteal hematoma.  Admitted for sepsis secondary to MSSA bacteremia likely from left gluteal myositis/abscess.  S/p I&D of left gluteal abscess x2.  Assessment & Plan:   Principal Problem:   MSSA bacteremia Active Problems:   Acute lower UTI   Sepsis ([place])   Uncontrolled diabetes mellitus (HCC)   Obesity, Class III, BMI 40-49.9 (morbid obesity) (HCC)   Cellulitis, gluteal, left  Sepsis secondary to MSSA bacteremia  Blood cx pos for MSSA, ID consulted   TTE and TEE performed 12/7 with no evidence of vegetations  currently on ancef   Will need 6wks total IV antibiotics (End date 07/30/19)  Patient has right upper extremity PICC line.  MSSA UTI  Continued on ancef per above  Presenting UA suggestive of UTI, however, pt denies dysuria or other symptoms  Rpt UA just shows hematuria. Will need outpt follow up after completion of antibiotics  Urine culture 12/1 showed greater than 100 K colonies per mL of MSSA.  This is likely related to his MSSA bacteremia.  Left gluteal myositis, abscess - MSSA  Per above, pt is continued on ancef  Marked pain with findings suggestive of myositis on CT  f/u MRI findings per above  Appreciate Orthopedics input.   Pt now s/p drain placement by IR 12/7.    Rpt CT shows residual abscess measuring 9.5 x 5.7 x 16.6cm with extension anteriorly measuring 7.8 x 1.9 x 15.5cm, extending to muscular fascia.  I&D  with washout 12/14  S/p left gluteal abscess I&D in OR by orthopedics on 12/16.  EBL 500 mL continue wound VAC.Marland Kitchen  Hyperkalemia  Resolved.  Morbid obesity  BMI of 47  diet/lifestyle modification recommended  Uncontrolled type 2 diabetes mellitus with hyperglycemia  Hemoglobin A1c was 11 recently  Currently well controlled on regimen below including Lantus, mealtime NovoLog and NovoLog SSI.  Suspected cirrhosis  Noted on imaging, patient denies any alcohol use currently but did drink regularly in the past, suspect NASH/fatty liver disease is likely the primary culprit  Hep C neg  GI follow up recommended on d/c    Addendum:  - This addendum is made in response to the patient's written request to the Health Information Management (HIM) Department. He disputes the statement made in the chart notes stating that he previously used alcohol or drugs. He indicated to HIM that he has Non Alcoholic Cirrhosis and has never done alcohol or drugs. - I cannot recollect if I obtained the information pertaining to substance use directly from him or the information was pulled from prior providers notes.  - Based on the written information the patient provided to the HIM department, the notes are hereby amended to reflect that the patient reports no drug use or alcohol use.  Anemia  Has been stable.  However had EBL of 500 mL today at surgery.  Follow CBC closely in a.m. and transfuse if hemoglobin 7 g or less.  Thrombocytopenia  Fluctuating platelet counts.  May be  due to acute illness, infection or antibiotics.  Follow CBCs closely.  DVT prophylaxis: SCDs Code Status: Full Family Communication: None at bedside Disposition Plan: Uncertain at this time.  To be determined pending clinical improvement.  Consultants:   ID  Cardiology  Orthopedic Surgery  IR  Procedures:   TEE 12/7  IR guided drain placement to L thigh 12/8  Incision and Drainage of Gluteal abscess  06/23/19 & 12/16  Antimicrobials: Anti-infectives (From admission, onward)   Start     Dose/Rate Route Frequency Ordered Stop   06/25/19 1407  vancomycin (VANCOCIN) powder  Status:  Discontinued       As needed 06/25/19 1407 06/25/19 1616   06/25/19 1406  tobramycin (NEBCIN) powder  Status:  Discontinued       As needed 06/25/19 1406 06/25/19 1616   06/23/19 1431  vancomycin (VANCOCIN) powder  Status:  Discontinued       As needed 06/23/19 1432 06/23/19 1459   06/20/19 0000  ceFAZolin (ANCEF) IVPB     2 g Intravenous Every 8 hours 06/20/19 1701 07/29/19 2359   06/11/19 2200  ceFAZolin (ANCEF) IVPB 2g/100 mL premix     2 g 200 mL/hr over 30 Minutes Intravenous Every 8 hours 06/11/19 1456     06/11/19 0200  vancomycin (VANCOCIN) 1,750 mg in sodium chloride 0.9 % 500 mL IVPB  Status:  Discontinued     1,750 mg 250 mL/hr over 120 Minutes Intravenous Every 12 hours 06/10/19 1257 06/11/19 1456   06/10/19 2200  ceFEPIme (MAXIPIME) 2 g in sodium chloride 0.9 % 100 mL IVPB  Status:  Discontinued     2 g 200 mL/hr over 30 Minutes Intravenous Every 8 hours 06/10/19 1257 06/10/19 1740   06/10/19 1745  cefTRIAXone (ROCEPHIN) 1 g in sodium chloride 0.9 % 100 mL IVPB  Status:  Discontinued     1 g 200 mL/hr over 30 Minutes Intravenous Every 24 hours 06/10/19 1740 06/11/19 1456   06/10/19 1245  ceFEPIme (MAXIPIME) 2 g in sodium chloride 0.9 % 100 mL IVPB     2 g 200 mL/hr over 30 Minutes Intravenous  Once 06/10/19 1239 06/10/19 1411   06/10/19 1245  vancomycin (VANCOCIN) IVPB 1000 mg/200 mL premix  Status:  Discontinued     1,000 mg 200 mL/hr over 60 Minutes Intravenous  Once 06/10/19 1239 06/10/19 1241   06/10/19 1245  vancomycin (VANCOCIN) 2,500 mg in sodium chloride 0.9 % 500 mL IVPB     2,500 mg 250 mL/hr over 120 Minutes Intravenous  Once 06/10/19 1241 06/10/19 1635      Subjective: Patient reports that his left lower extremity pain is significantly improved compared to admission.   Concerned that he has gained 5 pound weight over the last 24 hours and feels that it is due to IV fluids although he was only on KVO at 20 mL/h.  Expressed to him that may be it is error and will request nursing to weigh him on a standing scale.  Patient was seen this morning prior to procedure.  Objective: Vitals:   06/25/19 1655 06/25/19 1710 06/25/19 1751 06/25/19 1753  BP: 115/79 (!) 113/98  121/85  Pulse: (!) 120 (!) 118  (!) 122  Resp: 13 15  16   Temp:  97.7 F (36.5 C)  (!) 97.4 F (36.3 C)  TempSrc:      SpO2: 100% 99% 96% 96%  Weight:      Height:        Intake/Output Summary (  Last 24 hours) at 06/25/2019 5789 Last data filed at 06/25/2019 1700 Gross per 24 hour  Intake 2665.17 ml  Output 1350 ml  Net 1315.17 ml   Filed Weights   06/22/19 2059 06/23/19 1327 06/25/19 0400  Weight: (!) 169.1 kg (!) 169.1 kg (!) 171.3 kg    Examination: General exam: Pleasant young male, moderately built and morbidly obese lying comfortably propped up in bed. Respiratory system: Clear to auscultation.  No increased work of breathing. Cardiovascular system: S1 and S2 heard, RRR.  No JVD, murmurs or pedal edema.  Telemetry personally reviewed: SR-intermittent mild sinus tachycardia in the 120s. Gastrointestinal system: Nondistended, soft and nontender.  No organomegaly or masses appreciated.  Normal bowel sounds heard. Central nervous system: Alert and oriented x3.  No focal neurological deficits. Extremities: Symmetric 5 x 5 power. Skin: Left upper hip/buttock area dressing clean and dry with wound VAC in place. Psychiatry: Mood normal // no visual hallucinations   Data Reviewed: I have personally reviewed following labs and imaging studies  CBC: Recent Labs  Lab 06/20/19 0515 06/21/19 0327 06/22/19 0356 06/23/19 0254 06/24/19 0441 06/25/19 0351  WBC 10.1 10.5 8.5 13.8* 13.6* 7.2  NEUTROABS 7.2  --   --   --   --   --   HGB 11.2* 11.3* 10.6* 11.3* 10.7* 9.3*  HCT 33.9* 34.8*  31.5* 33.9* 32.7* 28.5*  MCV 92.6 95.3 93.5 93.6 95.6 96.0  PLT 143* 155 133* 142* 153 784*   Basic Metabolic Panel: Recent Labs  Lab 06/20/19 0515 06/22/19 0356 06/23/19 0254 06/24/19 0441 06/24/19 1040 06/25/19 0351  NA  --  137 135 131* 130* 133*  K  --  4.1 4.3 5.5* 4.8 4.2  CL  --  104 104 102 102 103  CO2  --  26 23 23 23 25   GLUCOSE  --  130* 174* 172* 179* 164*  BUN  --  12 15 20 19 16   CREATININE  --  0.48* 0.62 0.74 0.71 0.66  CALCIUM  --  8.0* 8.0* 7.7* 7.6* 7.8*  MG 1.9  --   --   --   --   --   PHOS 3.2  --   --   --   --   --    GFR: Estimated Creatinine Clearance: 182.1 mL/min (by C-G formula based on SCr of 0.66 mg/dL). Liver Function Tests: No results for input(s): AST, ALT, ALKPHOS, BILITOT, PROT, ALBUMIN in the last 168 hours. No results for input(s): LIPASE, AMYLASE in the last 168 hours. No results for input(s): AMMONIA in the last 168 hours. Coagulation Profile: Recent Labs  Lab 06/22/19 0356  INR 1.3*   CBG: Recent Labs  Lab 06/25/19 0658 06/25/19 1112 06/25/19 1356 06/25/19 1624 06/25/19 1751  GLUCAP 131* 123* 125* 144* 163*     Recent Results (from the past 240 hour(s))  Aerobic/Anaerobic Culture (surgical/deep wound)     Status: None   Collection Time: 06/17/19 11:27 AM   Specimen: Abscess  Result Value Ref Range Status   Specimen Description ABSCESS LEFT GLUTEAL  Final   Special Requests Normal  Final   Gram Stain   Final    ABUNDANT WBC PRESENT,BOTH PMN AND MONONUCLEAR ABUNDANT GRAM POSITIVE COCCI    Culture   Final    MODERATE STAPHYLOCOCCUS AUREUS NO ANAEROBES ISOLATED Performed at [facility], [place] [address] [place], [place] [address]    Report Status 06/22/2019 FINAL  Final   Organism ID, Bacteria STAPHYLOCOCCUS  AUREUS  Final      Susceptibility   Staphylococcus aureus - MIC*    CIPROFLOXACIN <=0.5 SENSITIVE Sensitive     ERYTHROMYCIN <=0.25 SENSITIVE Sensitive     GENTAMICIN <=0.5 SENSITIVE Sensitive      OXACILLIN 0.5 SENSITIVE Sensitive     TETRACYCLINE >=16 RESISTANT Resistant     VANCOMYCIN 1 SENSITIVE Sensitive     TRIMETH/SULFA <=10 SENSITIVE Sensitive     CLINDAMYCIN <=0.25 SENSITIVE Sensitive     RIFAMPIN <=0.5 SENSITIVE Sensitive     Inducible Clindamycin NEGATIVE Sensitive     * MODERATE STAPHYLOCOCCUS AUREUS     Radiology Studies: No results found.  Scheduled Meds: . bisacodyl  10 mg Rectal Once  . Chlorhexidine Gluconate Cloth  6 each Topical Daily  . cholecalciferol  2,000 Units Oral Daily  . docusate sodium  100 mg Oral BID  . insulin aspart  0-15 Units Subcutaneous TID WC  . insulin aspart  0-5 Units Subcutaneous QHS  . insulin aspart  8 Units Subcutaneous TID WC  . insulin glargine  24 Units Subcutaneous Daily  . loratadine  10 mg Oral Daily  . multivitamin with minerals  1 tablet Oral Daily  . Ensure Max Protein  11 oz Oral Daily  . sodium chloride flush  10-40 mL Intracatheter Q12H  . sodium chloride flush  5 mL Intracatheter Q8H  . vitamin C  1,000 mg Oral Daily   Continuous Infusions: .  ceFAZolin (ANCEF) IV 2 g (06/24/19 2034)     LOS: 15 days   Vernell Leep, MD, [place], [facility]. [facility]  To contact the attending provider between 7A-7P or the covering provider during after hours 7P-7A, please log into the web site [email or web address] and access using universal [place] password for that web site. If you do not have the password, please call the hospital operator.

## 2019-06-25 NOTE — Op Note (Signed)
Orthopaedic Surgery Operative Note (CSN: [id number] ) Date of Surgery: 06/25/2019  Admit Date: 06/10/2019   Diagnoses: Pre-Op Diagnoses: Left gluteal abscess   Post-Op Diagnosis: Same  Procedures: CPT 99253-Incision and drainage of left gluteal abscess  Surgeons : Primary: Shona Needles, MD  Assistant: Patrecia Pace, PA-C  Location: OR 5   Anesthesia:General  Antibiotics: Ancef IV, 1 g of vancomycin powder 1.2 g of tobramycin powder placed topically in the wound  Tourniquet time:None    Estimated Blood IPJA:250 mL  Complications:None   Specimens:None   Implants: * No implants in log *   Indications for Surgery: 53 year old male with a history of diabetes that presented with a gluteal abscess.  He had a IR drain placed but this was unsuccessful and he subsequently had I&D with Dr. Percell Miller on 06/23/2019.  Due to the complexity of his abscess it was recommended orthopedic traumatologist take over care due to the location and size.  I recommended return to the operating room for repeat irrigation debridement.  Risks and benefits were discussed with the patient.  He agreed to proceed with surgery and consent was obtained  Operative Findings: Large gluteal abscess between the gluteus maximus and gluteus medius musculature treated with repeat irrigation debridement.  Procedure: The patient was identified in the preoperative holding area. Consent was confirmed with the patient and their family and all questions were answered. The operative extremity was marked after confirmation with the patient. he was then brought back to the operating room by our anesthesia colleagues.  He was placed under general anesthetic and carefully transferred over to a radiolucent flat top table.  He was placed in the lateral decubitus position with a beanbag.  His bony prominences were well-padded.  An axillary roll was placed to keep pressure off his neurovascular structures.  The left lower extremity  was then prepped and draped in usual sterile fashion.  A timeout was performed to verify the patient procedure and extremity.  Preoperative antibiotics were dosed.  There were two, 5 cm incisions one over the greater trochanter and the other more proximal over the gluteal region.  I extended the inferior 1 2 more replicate a standard Kocher incision.  I carried it down through skin subcutaneous tissue.  I removed the sutures that were holding the IT band in place.  I then incised through the IT band and gluteal fascia to the previous proximal incision to be able to access the cavity.  There is significant cavity that extended from the lateral ilium all the way to the ischial tuberosity.  There was some old hematoma blood and some mixed purulence that was evacuated.  From my preoperative planning from the CT scan I felt that the cavity was all that was present.  I then debrided the cavity with a ronguer and a Cobb elevator.  I then used cystoscopy tubing to irrigate the wound and cavity with a 6 L of normal saline.  I then changed gloves.  I placed a medium Hemovac drain deep to the gluteal fascia into the cavity and sewed this in place with a 2-0 nylon.  I then placed the 1 g of vancomycin powder 1.2 g of tobramycin powder into the wound.  I closed the IT band and gluteal fascia with a running 0 PDS suture.  The Scarpa's fascia was closed with 2-0 Monocryl.  The skin was closed with 2-0 Monocryl 2-0 nylon.  A Praveena incisional VAC was placed to the wound.  The drain was hooked up  to suction.  The patient was then awoken from anesthesia and taken the PACU in stable condition.  Post Op Plan/Instructions: The patient will be weightbearing as tolerated to left lower extremity.  He will continue with the incisional wound VAC until he discharges home.  We will continue with the drain until there is minimal output.  Continue the IV antibiotics under the discretion of the infectious disease team.  DVT prophylaxis  is at the discretion of the primary team.  I was present and performed the entire surgery.  Patrecia Pace, PA-C did assist me throughout the case. An assistant was necessary given the difficulty in approach and debridement of the abscess.  Katha Hamming, MD [facility]

## 2019-06-26 ENCOUNTER — Encounter: Payer: Self-pay | Admitting: *Deleted

## 2019-06-26 ENCOUNTER — Encounter (HOSPITAL_COMMUNITY)
Admission: EM | Disposition: A | Discharge status: Home Health | Payer: Self-pay | Source: Home / Self Care | Attending: Internal Medicine

## 2019-06-26 LAB — BASIC METABOLIC PANEL
Anion gap: 8 (ref 5–15)
BUN: 19 mg/dL (ref 6–20)
CO2: 23 mmol/L (ref 22–32)
Calcium: 7.8 mg/dL — ABNORMAL LOW (ref 8.9–10.3)
Chloride: 101 mmol/L (ref 98–111)
Creatinine, Ser: 0.66 mg/dL (ref 0.61–1.24)
GFR calc Af Amer: >60 mL/min (ref >60)
GFR calc non Af Amer: >60 mL/min (ref >60)
Glucose, Bld: 274 mg/dL — ABNORMAL HIGH (ref 70–99)
Potassium: 5.1 mmol/L (ref 3.5–5.1)
Sodium: 132 mmol/L — ABNORMAL LOW (ref 135–145)

## 2019-06-26 LAB — CBC
HCT: 25.8 % — ABNORMAL LOW (ref 39.0–52.0)
Hemoglobin: 8.7 g/dL — ABNORMAL LOW (ref 13.0–17.0)
MCH: 31.6 pg (ref 26.0–34.0)
MCHC: 33.7 g/dL (ref 30.0–36.0)
MCV: 93.8 fL (ref 80.0–100.0)
Platelets: 128 10*3/uL — ABNORMAL LOW (ref 150–400)
RBC: 2.75 MIL/uL — ABNORMAL LOW (ref 4.22–5.81)
RDW: 16 % — ABNORMAL HIGH (ref 11.5–15.5)
WBC: 8.7 10*3/uL (ref 4.0–10.5)
nRBC: 0 % (ref 0.0–0.2)

## 2019-06-26 LAB — GLUCOSE, CAPILLARY
Glucose-Capillary: 236 mg/dL — ABNORMAL HIGH (ref 70–99)
Glucose-Capillary: 271 mg/dL — ABNORMAL HIGH (ref 70–99)
Glucose-Capillary: 237 mg/dL — ABNORMAL HIGH (ref 70–99)
Glucose-Capillary: 247 mg/dL — ABNORMAL HIGH (ref 70–99)

## 2019-06-26 SURGERY — IRRIGATION AND DEBRIDEMENT HIP
Anesthesia: General | Site: Hip | Laterality: Left

## 2019-06-26 NOTE — Progress Notes (Signed)
Orthopaedic Trauma Progress Note  S: Patient doing okay this morning.  Noted significant improvement in pain and pressure in his leg from preoperatively.  Hemovac has had significant output and has been emptied twice since surgery yesterday.  O:  Vitals:   06/25/19 2100 06/26/19 0526  BP:  140/73  Pulse: 100 93  Resp:  18  Temp:  97.8 F (36.6 C)  SpO2:  94%    General - Sitting up in bed, no acute distress Respiratory - No increased work of breathing. Left lower extremity -incisional VAC in place with good seal and function, no output in canister currently.  Hemovac with good suction and function, minimal output in container currently.  Improving tenderness with palpation over posterior hip, gluteals, thigh area.  Able to actively flex the knee some without significant discomfort in the hip.  Ankle dorsiflexion/plantarflexion intact.  Motor and sensory function intact distally.  Neurovascularly intact.  Imaging: No new imaging  Labs:  Results for orders placed or performed during the hospital encounter of 06/10/19 (from the past 24 hour(s))  Glucose, capillary     Status: Abnormal   Collection Time: 06/25/19 11:12 AM  Result Value Ref Range   Glucose-Capillary 123 (H) 70 - 99 mg/dL  Glucose, capillary     Status: Abnormal   Collection Time: 06/25/19  1:56 PM  Result Value Ref Range   Glucose-Capillary 125 (H) 70 - 99 mg/dL  Glucose, capillary     Status: Abnormal   Collection Time: 06/25/19  4:24 PM  Result Value Ref Range   Glucose-Capillary 144 (H) 70 - 99 mg/dL  Glucose, capillary     Status: Abnormal   Collection Time: 06/25/19  5:51 PM  Result Value Ref Range   Glucose-Capillary 163 (H) 70 - 99 mg/dL  Glucose, capillary     Status: Abnormal   Collection Time: 06/25/19  8:44 PM  Result Value Ref Range   Glucose-Capillary 261 (H) 70 - 99 mg/dL  CBC     Status: Abnormal   Collection Time: 06/26/19  4:29 AM  Result Value Ref Range   WBC 8.7 4.0 - 10.5 K/uL   RBC 2.75  (L) 4.22 - 5.81 MIL/uL   Hemoglobin 8.7 (L) 13.0 - 17.0 g/dL   HCT 25.8 (L) 39.0 - 52.0 %   MCV 93.8 80.0 - 100.0 fL   MCH 31.6 26.0 - 34.0 pg   MCHC 33.7 30.0 - 36.0 g/dL   RDW 16.0 (H) 11.5 - 15.5 %   Platelets 128 (L) 150 - 400 K/uL   nRBC 0.0 0.0 - 0.2 %  Basic metabolic panel     Status: Abnormal   Collection Time: 06/26/19  4:29 AM  Result Value Ref Range   Sodium 132 (L) 135 - 145 mmol/L   Potassium 5.1 3.5 - 5.1 mmol/L   Chloride 101 98 - 111 mmol/L   CO2 23 22 - 32 mmol/L   Glucose, Bld 274 (H) 70 - 99 mg/dL   BUN 19 6 - 20 mg/dL   Creatinine, Ser 0.66 0.61 - 1.24 mg/dL   Calcium 7.8 (L) 8.9 - 10.3 mg/dL   GFR calc non Af Amer >60 >60 mL/min   GFR calc Af Amer >60 >60 mL/min   Anion gap 8 5 - 15  Glucose, capillary     Status: Abnormal   Collection Time: 06/26/19  7:11 AM  Result Value Ref Range   Glucose-Capillary 236 (H) 70 - 99 mg/dL    Assessment: 53 year old male  with left gluteal abscess status post incision and drainage with placement of Hemovac   Weightbearing: WBAT LLE  Insicional and dressing care: Continue incisional VAC until patient discharges home.  Continue Hemovac drain until there is minimal output.  Orthopedic device(s): Wound LKT:GYBW lower extremity  CV/Blood loss: Acute blood loss anemia, Hgb 8.7 this morning. Hemodynamically stable. Continue to monitor CBC  Pain management:  1. Tylenol 650 mg q 6 hours scheduled 2. Percocet 5-325 q 4 hours PRN 3 Dilaudid 0.5 mg q 4 hours PRN  VTE prophylaxis: per primary team  ID: Ancef 2gm q 8 hours per primary team  Foley/Lines:  No foley, KVO IVFs  Medical co-morbidities: morbid obesity, DM  Dispo: PT eval and treat. Continue hemovac drain until output is minimal. Continue incisional vac until discharge. Continue care per primary team.   Follow - up plan: 2 weeks after hospital discharge  Contact information:  Katha Hamming MD, Patrecia Pace PA-C   [facility] [phone] (office) [email or web address]

## 2019-06-26 NOTE — Progress Notes (Addendum)
PROGRESS NOTE    Robert Sullivan  DHR:[id number] DOB: October 02, 1965 DOA: 06/10/2019 PCP: Cathleen Corti, PA-C    Brief Narrative:  53 y.o. male with history of uncontrolled type 2 diabetes mellitus, morbid obesity was admitted to [facility] from 11/2-11/7 with E. coli UTI, uncontrolled diabetes and a fall that resulted in a left gluteal hematoma, he was treated with IV antibiotics, discharged home on Bactrim, completed course and also on insulin, presented with several days history of fever, chills, increased pain and swelling at site of left gluteal hematoma.  Admitted for sepsis secondary to MSSA bacteremia likely from left gluteal myositis/abscess.  S/p I&D of left gluteal abscess x2.  Assessment & Plan:   Principal Problem:   MSSA bacteremia Active Problems:   Acute lower UTI   Sepsis ([place])   Uncontrolled diabetes mellitus (HCC)   Obesity, Class III, BMI 40-49.9 (morbid obesity) (HCC)   Cellulitis, gluteal, left  Sepsis secondary to MSSA bacteremia  Blood cx pos for MSSA, ID consulted   TTE and TEE performed 12/7 with no evidence of vegetations  currently on ancef   Will need 6wks total IV antibiotics (End date 07/30/19)  Patient has right upper extremity PICC line.  Sepsis resolved.  MSSA UTI  Continued on ancef per above  Presenting UA suggestive of UTI, however, pt denies dysuria or other symptoms  Rpt UA just shows hematuria. Will need outpt follow up after completion of antibiotics  Urine culture 12/1 showed greater than 100 K colonies per mL of MSSA.  This is likely related to his MSSA bacteremia.  Left gluteal myositis, abscess - MSSA  Per above, pt is continued on ancef  Appreciate Orthopedics input.   Initial drain placement by IR 12/7.    Rpt CT showed residual abscess measuring 9.5 x 5.7 x 16.6cm with extension anteriorly measuring 7.8 x 1.9 x 15.5cm, extending to muscular fascia.  Orthopedics performed I&D with washout 12/14 and 12/16  Has wound VAC  in place with substantial drainage post operatively, continue wound VAC until minimal drainage.  Orthopedics following.  Hyperkalemia  Resolved.  Morbid obesity  BMI of 47  diet/lifestyle modification recommended  Uncontrolled type 2 diabetes mellitus with hyperglycemia  Hemoglobin A1c was 11 recently  CBGs slightly uncontrolled in the 200s today.  If persists consider increasing Lantus tomorrow.  Continue current regimen of Lantus, mealtime NovoLog and SSI.  Suspected cirrhosis  Noted on imaging, patient denies any alcohol use currently but did drink regularly in the past, suspect NASH/fatty liver disease is likely the primary culprit  Hep C neg  GI follow up recommended on d/c  Addendum:  - This addendum is made in response to the patient's written request to the Health Information Management (HIM) Department. He disputes the statement made in the chart notes stating that he previously used alcohol or drugs. He indicated to HIM that he has Non Alcoholic Cirrhosis and has never done alcohol or drugs. - I cannot recollect if I obtained the information pertaining to substance use directly from him or the information was pulled from prior providers notes.  - Based on the written information the patient provided to the HIM department, the notes are hereby amended to reflect that the patient reports no drug use or alcohol use.   Anemia  Had 500 mL EBL at surgery on 12/16.  Hemoglobin down to 8.7 today.  Follow CBC daily and transfuse if hemoglobin 7 g or less.  Thrombocytopenia  Fluctuating platelet counts.  May be due  to acute illness, infection or antibiotics.  Slightly better today.  Monitor.  DVT prophylaxis: SCDs Code Status: Full Family Communication: None at bedside Disposition Plan: Uncertain at this time.  To be determined pending clinical improvement.  Consultants:   ID  Cardiology  Orthopedic Surgery  IR  Procedures:   TEE 12/7  IR guided drain  placement to L thigh 12/8  Incision and Drainage of Gluteal abscess 06/23/19 & 12/16  Antimicrobials: Anti-infectives (From admission, onward)   Start     Dose/Rate Route Frequency Ordered Stop   06/25/19 1407  vancomycin (VANCOCIN) powder  Status:  Discontinued       As needed 06/25/19 1407 06/25/19 1616   06/25/19 1406  tobramycin (NEBCIN) powder  Status:  Discontinued       As needed 06/25/19 1406 06/25/19 1616   06/23/19 1431  vancomycin (VANCOCIN) powder  Status:  Discontinued       As needed 06/23/19 1432 06/23/19 1459   06/20/19 0000  ceFAZolin (ANCEF) IVPB     2 g Intravenous Every 8 hours 06/20/19 1701 07/29/19 2359   06/11/19 2200  ceFAZolin (ANCEF) IVPB 2g/100 mL premix     2 g 200 mL/hr over 30 Minutes Intravenous Every 8 hours 06/11/19 1456     06/11/19 0200  vancomycin (VANCOCIN) 1,750 mg in sodium chloride 0.9 % 500 mL IVPB  Status:  Discontinued     1,750 mg 250 mL/hr over 120 Minutes Intravenous Every 12 hours 06/10/19 1257 06/11/19 1456   06/10/19 2200  ceFEPIme (MAXIPIME) 2 g in sodium chloride 0.9 % 100 mL IVPB  Status:  Discontinued     2 g 200 mL/hr over 30 Minutes Intravenous Every 8 hours 06/10/19 1257 06/10/19 1740   06/10/19 1745  cefTRIAXone (ROCEPHIN) 1 g in sodium chloride 0.9 % 100 mL IVPB  Status:  Discontinued     1 g 200 mL/hr over 30 Minutes Intravenous Every 24 hours 06/10/19 1740 06/11/19 1456   06/10/19 1245  ceFEPIme (MAXIPIME) 2 g in sodium chloride 0.9 % 100 mL IVPB     2 g 200 mL/hr over 30 Minutes Intravenous  Once 06/10/19 1239 06/10/19 1411   06/10/19 1245  vancomycin (VANCOCIN) IVPB 1000 mg/200 mL premix  Status:  Discontinued     1,000 mg 200 mL/hr over 60 Minutes Intravenous  Once 06/10/19 1239 06/10/19 1241   06/10/19 1245  vancomycin (VANCOCIN) 2,500 mg in sodium chloride 0.9 % 500 mL IVPB     2,500 mg 250 mL/hr over 120 Minutes Intravenous  Once 06/10/19 1241 06/10/19 1635      Subjective: Patient reports that his left leg  tightness and soreness are significantly better after second I&D yesterday.  Up in bedside commode this morning.  Attempting to have a BM.  Hopes to return to work soon.  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  Objective: Vitals:   06/25/19 2043 06/25/19 2100 06/26/19 0526 06/26/19 1500  BP: 132/77  140/73 (!) 144/78  Pulse: (!) 118 100 93 90  Resp: 18  18 20   Temp: 98.5 F (36.9 C)  97.8 F (36.6 C) 98 F (36.7 C)  TempSrc:    Oral  SpO2: 96%  94% 96%  Weight: (!) 171.3 kg     Height:        Intake/Output Summary (Last 24 hours) at 06/26/2019 1711 Last data filed at 06/26/2019 1602 Gross per 24 hour  Intake 650 ml  Output 320 ml  Net 330  ml   Filed Weights   06/23/19 1327 06/25/19 0400 06/25/19 2043  Weight: (!) 169.1 kg (!) 171.3 kg (!) 171.3 kg    Examination: General exam: Pleasant young male, moderately built and morbidly obese sitting up comfortably on bedside commode. Respiratory system: Clear to auscultation.  No increased work of breathing. Cardiovascular system: S1 and S2 heard, RRR.  No JVD, murmurs or pedal edema.  Telemetry personally reviewed: SR.  Occasional mild ST. Gastrointestinal system: Nondistended, soft and nontender.  No organomegaly or masses appreciated.  Normal bowel sounds heard. Central nervous system: Alert and oriented x3.  No focal neurological deficits. Extremities: Symmetric 5 x 5 power. Skin: Left hip/gluteal area wound VAC intact draining bloody fluid. Psychiatry: Mood normal // no visual hallucinations   Data Reviewed: I have personally reviewed following labs and imaging studies  CBC: Recent Labs  Lab 06/20/19 0515 06/22/19 0356 06/23/19 0254 06/24/19 0441 06/25/19 0351 06/26/19 0429  WBC 10.1 8.5 13.8* 13.6* 7.2 8.7  NEUTROABS 7.2  --   --   --   --   --   HGB 11.2* 10.6* 11.3* 10.7* 9.3* 8.7*  HCT 33.9* 31.5* 33.9* 32.7* 28.5* 25.8*  MCV 92.6 93.5 93.6 95.6 96.0 93.8  PLT 143* 133* 142* 153 116* 128*   Basic  Metabolic Panel: Recent Labs  Lab 06/20/19 0515 06/23/19 0254 06/24/19 0441 06/24/19 1040 06/25/19 0351 06/26/19 0429  NA  --  135 131* 130* 133* 132*  K  --  4.3 5.5* 4.8 4.2 5.1  CL  --  104 102 102 103 101  CO2  --  23 23 23 25 23   GLUCOSE  --  174* 172* 179* 164* 274*  BUN  --  15 20 19 16 19   CREATININE  --  0.62 0.74 0.71 0.66 0.66  CALCIUM  --  8.0* 7.7* 7.6* 7.8* 7.8*  MG 1.9  --   --   --   --   --   PHOS 3.2  --   --   --   --   --    GFR: Estimated Creatinine Clearance: 182.1 mL/min (by C-G formula based on SCr of 0.66 mg/dL). Liver Function Tests: No results for input(s): AST, ALT, ALKPHOS, BILITOT, PROT, ALBUMIN in the last 168 hours. No results for input(s): LIPASE, AMYLASE in the last 168 hours. No results for input(s): AMMONIA in the last 168 hours. Coagulation Profile: Recent Labs  Lab 06/22/19 0356  INR 1.3*   CBG: Recent Labs  Lab 06/25/19 1751 06/25/19 2044 06/26/19 0711 06/26/19 1112 06/26/19 1603  GLUCAP 163* 261* 236* 271* 237*     Recent Results (from the past 240 hour(s))  Aerobic/Anaerobic Culture (surgical/deep wound)     Status: None   Collection Time: 06/17/19 11:27 AM   Specimen: Abscess  Result Value Ref Range Status   Specimen Description ABSCESS LEFT GLUTEAL  Final   Special Requests Normal  Final   Gram Stain   Final    ABUNDANT WBC PRESENT,BOTH PMN AND MONONUCLEAR ABUNDANT GRAM POSITIVE COCCI    Culture   Final    MODERATE STAPHYLOCOCCUS AUREUS NO ANAEROBES ISOLATED Performed at [facility], [place] [address], [place], [place] [address]    Report Status 06/22/2019 FINAL  Final   Organism ID, Bacteria STAPHYLOCOCCUS AUREUS  Final      Susceptibility   Staphylococcus aureus - MIC*    CIPROFLOXACIN <=0.5 SENSITIVE Sensitive     ERYTHROMYCIN <=0.25 SENSITIVE Sensitive  GENTAMICIN <=0.5 SENSITIVE Sensitive     OXACILLIN 0.5 SENSITIVE Sensitive     TETRACYCLINE >=16 RESISTANT Resistant     VANCOMYCIN 1 SENSITIVE  Sensitive     TRIMETH/SULFA <=10 SENSITIVE Sensitive     CLINDAMYCIN <=0.25 SENSITIVE Sensitive     RIFAMPIN <=0.5 SENSITIVE Sensitive     Inducible Clindamycin NEGATIVE Sensitive     * MODERATE STAPHYLOCOCCUS AUREUS     Radiology Studies: No results found.  Scheduled Meds: . Chlorhexidine Gluconate Cloth  6 each Topical Daily  . cholecalciferol  2,000 Units Oral Daily  . docusate sodium  100 mg Oral BID  . insulin aspart  0-15 Units Subcutaneous TID WC  . insulin aspart  0-5 Units Subcutaneous QHS  . insulin aspart  8 Units Subcutaneous TID WC  . insulin glargine  24 Units Subcutaneous Daily  . loratadine  10 mg Oral Daily  . multivitamin with minerals  1 tablet Oral Daily  . Ensure Max Protein  11 oz Oral Daily  . sodium chloride flush  10-40 mL Intracatheter Q12H  . sodium chloride flush  5 mL Intracatheter Q8H  . vitamin C  1,000 mg Oral Daily   Continuous Infusions: .  ceFAZolin (ANCEF) IV 2 g (06/26/19 1340)     LOS: 16 days   Vernell Leep, MD, [place], [facility]. [facility]  To contact the attending provider between 7A-7P or the covering provider during after hours 7P-7A, please log into the web site [email or web address] and access using universal [place] password for that web site. If you do not have the password, please call the hospital operator.

## 2019-06-26 NOTE — Anesthesia Postprocedure Evaluation (Signed)
Anesthesia Post Note  Patient: Robert Sullivan  Procedure(s) Performed: IRRIGATION AND DEBRIDEMENT HIP (Left Hip)     Patient location during evaluation: PACU Anesthesia Type: General Level of consciousness: awake and alert Pain management: pain level controlled Vital Signs Assessment: post-procedure vital signs reviewed and stable Respiratory status: spontaneous breathing, nonlabored ventilation and respiratory function stable Cardiovascular status: blood pressure returned to baseline and stable Postop Assessment: no apparent nausea or vomiting Anesthetic complications: no    Last Vitals:  Vitals:   06/25/19 2100 06/26/19 0526  BP:  140/73  Pulse: 100 93  Resp:  18  Temp:  36.6 C  SpO2:  94%    Last Pain:  Vitals:   06/26/19 0408  TempSrc:   PainSc: Asleep                 Catalina Gravel

## 2019-06-26 NOTE — Progress Notes (Signed)
Physical Therapy Treatment Patient Details Name: Robert Sullivan MRN: [id number] DOB: June 17, 1966 Today's Date: 06/26/2019    History of Present Illness Pt is a 53 y/o M admitted on 06/10/19 for fevers & chills & increased swelling at hematoma with pt found to have L gluteal abscess & MSSA bacetermia.  P t previously admitted to [facility] on 11/2-11/7 for E. Coli UTI, uncontrolled diabetes, & fall that resulted in a L gluteal hematoma, was treated with IV antibiotics & d/c home. Pt underwent TEE on 06/16/19 that showed no evidence of endocarditis. New I and D on 12/14, will likely have another soon.  PMH significant for morbid obesity, uncontrolled DM2, & arthritis in B knees. Pt underwent repeat I&D 12/16.    PT Comments    Pt required min guard assist transfers and ambulation 20' with RW. Distance limited by pain and fatigue. Pt requiring increased time to complete all functional mobility skills. HR 130s-140s during session.   Follow Up Recommendations  Home health PT     Equipment Recommendations  Other (comment)(bariatric BSC)    Recommendations for Other Services       Precautions / Restrictions Precautions Precautions: Fall;Other (comment) Precaution Comments: wound vac, drain Restrictions Weight Bearing Restrictions: No    Mobility  Bed Mobility Overal bed mobility: Needs Assistance Bed Mobility: Supine to Sit;Rolling Rolling: Modified independent (Device/Increase time)   Supine to sit: Min assist;HOB elevated     General bed mobility comments: cues for sequencing, increased time. Heavy use of rail.  Transfers Overall transfer level: Needs assistance Equipment used: Ambulation equipment used Transfers: Sit to/from [facility] Sit to Stand: Min guard;From elevated surface Stand pivot transfers: Min guard       General transfer comment: increased time to power up, min guard for safety/lines  Ambulation/Gait Ambulation/Gait assistance: Min guard Gait  Distance (Feet): 20 Feet Assistive device: Rolling walker (2 wheeled) Gait Pattern/deviations: Step-through pattern;Decreased stride length Gait velocity: decreased Gait velocity interpretation: <1.31 ft/sec, indicative of household ambulator General Gait Details: Distance limited by fatigue/pain.   Stairs             Wheelchair Mobility    Modified Rankin (Stroke Patients Only)       Balance Overall balance assessment: Needs assistance Sitting-balance support: No upper extremity supported;Feet supported Sitting balance-Leahy Scale: Good     Standing balance support: Bilateral upper extremity supported;During functional activity Standing balance-Leahy Scale: Fair Standing balance comment: static stand without UE support. RW for amb.                            Cognition Arousal/Alertness: Awake/alert Behavior During Therapy: WFL for tasks assessed/performed Overall Cognitive Status: Within Functional Limits for tasks assessed                                   Exercises     General Comments General comments (skin integrity, edema, etc.): tachy during session with HR range 130s-140s      Pertinent Vitals/Pain Pain Assessment: Faces Faces Pain Scale: Hurts little more Pain Location: L buttocks Pain Descriptors / Indicators: Grimacing;Guarding;Spasm;Discomfort Pain Intervention(s): Monitored during session;Limited activity within patient's tolerance;Repositioned;Premedicated before session    Home Living                      Prior Function  PT Goals (current goals can now be found in the care plan section) Acute Rehab PT Goals Patient Stated Goal: home PT Goal Formulation: With patient Time For Goal Achievement: 07/02/19 Potential to Achieve Goals: Good Progress towards PT goals: Progressing toward goals    Frequency    Min 2X/week      PT Plan Current plan remains appropriate    Co-evaluation               AM-PAC PT "6 Clicks" Mobility   Outcome Measure  Help needed turning from your back to your side while in a flat bed without using bedrails?: A Little Help needed moving from lying on your back to sitting on the side of a flat bed without using bedrails?: A Little Help needed moving to and from a bed to a chair (including a wheelchair)?: A Little Help needed standing up from a chair using your arms (e.g., wheelchair or bedside chair)?: A Little Help needed to walk in hospital room?: A Little Help needed climbing 3-5 steps with a railing? : A Lot 6 Click Score: 17    End of Session   Activity Tolerance: Patient tolerated treatment well Patient left: in chair;with call bell/phone within reach Nurse Communication: Mobility status PT Visit Diagnosis: Other abnormalities of gait and mobility (R26.89);Muscle weakness (generalized) (M62.81);Difficulty in walking, not elsewhere classified (R26.2);Pain Pain - Right/Left: Left Pain - part of body: Hip     Time: 2330-0762 PT Time Calculation (min) (ACUTE ONLY): 27 min  Charges:  $Gait Training: 23-37 mins                     Lorrin Goodell, [place]  Office # [phone] Pager [phone]    Lorriane Shire 06/26/2019, 10:31 AM

## 2019-06-27 DIAGNOSIS — E8779 Other fluid overload: Secondary | ICD-10-CM

## 2019-06-27 LAB — BASIC METABOLIC PANEL
Anion gap: 5 (ref 5–15)
BUN: 18 mg/dL (ref 6–20)
CO2: 25 mmol/L (ref 22–32)
Calcium: 7.7 mg/dL — ABNORMAL LOW (ref 8.9–10.3)
Chloride: 101 mmol/L (ref 98–111)
Creatinine, Ser: 0.57 mg/dL — ABNORMAL LOW (ref 0.61–1.24)
GFR calc Af Amer: >60 mL/min (ref >60)
GFR calc non Af Amer: >60 mL/min (ref >60)
Glucose, Bld: 227 mg/dL — ABNORMAL HIGH (ref 70–99)
Potassium: 4.3 mmol/L (ref 3.5–5.1)
Sodium: 131 mmol/L — ABNORMAL LOW (ref 135–145)

## 2019-06-27 LAB — GLUCOSE, CAPILLARY
Glucose-Capillary: 191 mg/dL — ABNORMAL HIGH (ref 70–99)
Glucose-Capillary: 201 mg/dL — ABNORMAL HIGH (ref 70–99)
Glucose-Capillary: 141 mg/dL — ABNORMAL HIGH (ref 70–99)
Glucose-Capillary: 149 mg/dL — ABNORMAL HIGH (ref 70–99)

## 2019-06-27 LAB — CBC
HCT: 22.6 % — ABNORMAL LOW (ref 39.0–52.0)
Hemoglobin: 7.4 g/dL — ABNORMAL LOW (ref 13.0–17.0)
MCH: 31.5 pg (ref 26.0–34.0)
MCHC: 32.7 g/dL (ref 30.0–36.0)
MCV: 96.2 fL (ref 80.0–100.0)
Platelets: 111 10*3/uL — ABNORMAL LOW (ref 150–400)
RBC: 2.35 MIL/uL — ABNORMAL LOW (ref 4.22–5.81)
RDW: 16.4 % — ABNORMAL HIGH (ref 11.5–15.5)
WBC: 6.5 10*3/uL (ref 4.0–10.5)
nRBC: 0 % (ref 0.0–0.2)

## 2019-06-27 MED ORDER — POLYETHYLENE GLYCOL 3350 17 G PO PACK
17.0000 g | PACK | Freq: Every day | ORAL | Status: DC
  Administered 2019-06-29: 17 g via ORAL
  Filled 2019-06-27 (×3): qty 1

## 2019-06-27 MED ORDER — FUROSEMIDE 10 MG/ML IJ SOLN
40.0000 mg | Freq: Once | INTRAMUSCULAR | Status: AC
  Administered 2019-06-27: 40 mg via INTRAVENOUS
  Filled 2019-06-27: qty 4

## 2019-06-27 NOTE — Progress Notes (Addendum)
Patient ID: Robert Sullivan, male   DOB: [birth year], 53 y.o.   MRN: [id number]   LOS: 17 days   Subjective: Feeling better day by day. Pain manageable.   Objective: Vital signs in last 24 hours: Temp:  [97.5 F (36.4 C)-98 F (36.7 C)] 97.5 F (36.4 C) (12/18 0719) Pulse Rate:  [70-90] 70 (12/18 0719) Resp:  [12-20] 16 (12/18 0719) BP: (132-144)/(54-78) 137/54 (12/18 0719) SpO2:  [96 %-98 %] 98 % (12/18 0719) Weight:  [179.3 kg] 179.3 kg (12/17 2100) Last BM Date: 06/26/19   Laboratory  CBC Recent Labs    06/26/19 0429 06/27/19 0450  WBC 8.7 6.5  HGB 8.7* 7.4*  HCT 25.8* 22.6*  PLT 128* 111*   BMET Recent Labs    06/26/19 0429 06/27/19 0450  NA 132* 131*  K 5.1 4.3  CL 101 101  CO2 23 25  GLUCOSE 274* 227*  BUN 19 18  CREATININE 0.66 0.57*  CALCIUM 7.8* 7.7*   Hemovac: 117m emptied last night at 2100  Physical Exam General appearance: alert and no distress  LLE: VAC, hemovac in place   Assessment/Plan: Left gluteal abscess -- Continue VAC and hemovac. Encouraged mobility. OBurkefor discharge home with hemovac. VAC should come off at time of discharge.    MLisette Abu PA-C Orthopedic Surgery [phone]/18/2020

## 2019-06-27 NOTE — Progress Notes (Addendum)
PROGRESS NOTE    Robert Sullivan  WUJ:[id number] DOB: 1966/03/11 DOA: 06/10/2019 PCP: Cathleen Corti, PA-C    Brief Narrative:  53 y.o. male with history of uncontrolled type 2 diabetes mellitus, morbid obesity was admitted to [facility] from 11/2-11/7 with E. coli UTI, uncontrolled diabetes and a fall that resulted in a left gluteal hematoma, he was treated with IV antibiotics, discharged home on Bactrim, completed course and also on insulin, presented with several days history of fever, chills, increased pain and swelling at site of left gluteal hematoma.  Admitted for sepsis secondary to MSSA bacteremia likely from left gluteal myositis/abscess.  S/p I&D of left gluteal abscess x2.  Course complicated by postop acute blood loss anemia.  Some fluid overload.  Assessment & Plan:   Principal Problem:   MSSA bacteremia Active Problems:   Acute lower UTI   Sepsis ([place])   Uncontrolled diabetes mellitus (HCC)   Obesity, Class III, BMI 40-49.9 (morbid obesity) (HCC)   Cellulitis, gluteal, left  Sepsis secondary to MSSA bacteremia  Blood cx pos for MSSA, ID consulted   TTE and TEE performed 12/7 with no evidence of vegetations  currently on ancef   Will need 6wks total IV antibiotics (End date 07/30/19)  Patient has right upper extremity PICC line.  Sepsis resolved.  MSSA UTI  Continued on ancef per above  Presenting UA suggestive of UTI, however, pt denies dysuria or other symptoms  Rpt UA just shows hematuria. Will need outpt follow up after completion of antibiotics  Urine culture 12/1 showed greater than 100 K colonies per mL of MSSA.  This is likely related to his MSSA bacteremia.  Left gluteal myositis, abscess - MSSA  Per above, pt is continued on ancef  Initial drain placement by IR 12/7, now out.    Rpt CT showed residual abscess measuring 9.5 x 5.7 x 16.6cm with extension anteriorly measuring 7.8 x 1.9 x 15.5cm, extending to muscular fascia.  Orthopedics performed  I&D with washout 12/14 and 12/16  I discussed with Hilbert Odor, orthopedic PA-C on 12/18.  Patient has both a wound VAC and Hemovac.  When medically stable, VAC could come off at discharge and okay to discharge home with the Hemovac.  Patient appears to have substantial volume overload related to IV fluid resuscitation during course of hospitalization.  We will give him a dose of IV Lasix 40 mg x 1 and reassess in a.m.  Hyperkalemia  Resolved.  Morbid obesity  BMI of 47  diet/lifestyle modification recommended  Uncontrolled type 2 diabetes mellitus with hyperglycemia  Hemoglobin A1c was 11 recently  CBGs slightly uncontrolled in the 200s today.  If persists consider increasing Lantus tomorrow.  Continue current regimen of Lantus, mealtime NovoLog and SSI.  Slightly better today, continue current regimen per  Suspected cirrhosis  Noted on imaging, patient denies any alcohol use currently but did drink regularly in the past, suspect NASH/fatty liver disease is likely the primary culprit  Hep C neg  GI follow up recommended on d/c  Addendum:  - This addendum is made in response to the patient's written request to the Health Information Management (HIM) Department. He disputes the statement made in the chart notes stating that he previously used alcohol or drugs. He indicated to HIM that he has Non Alcoholic Cirrhosis and has never done alcohol or drugs. - I cannot recollect if I obtained the information pertaining to substance use directly from him or the information was pulled from prior providers notes.  -  Based on the written information the patient provided to the HIM department, the notes are hereby amended to reflect that the patient reports no drug use or alcohol use.  Anemia  Hemoglobin has steadily dropped from 11.3 on 12/14-7.4 today.  Likely related to surgical blood loss.  Patient however relatively asymptomatic.  Follow CBC in a.m. and transfuse if hemoglobin 7 g  or less.  Thrombocytopenia  Likely related to acute blood loss anemia.  Stable.  Follow CBC daily.  DVT prophylaxis: SCDs Code Status: Full Family Communication: None at bedside. I discussed with patient's spouse, updated care and answered questions. Disposition Plan: DC home pending clinical improvement.  Not yet medically ready for discharge.  Consultants:   ID  Cardiology  Orthopedic Surgery  IR  Procedures:   TEE 12/7  IR guided drain placement to L thigh 12/8  Incision and Drainage of Gluteal abscess 06/23/19 & 12/16  Antimicrobials: Anti-infectives (From admission, onward)   Start     Dose/Rate Route Frequency Ordered Stop   06/25/19 1407  vancomycin (VANCOCIN) powder  Status:  Discontinued       As needed 06/25/19 1407 06/25/19 1616   06/25/19 1406  tobramycin (NEBCIN) powder  Status:  Discontinued       As needed 06/25/19 1406 06/25/19 1616   06/23/19 1431  vancomycin (VANCOCIN) powder  Status:  Discontinued       As needed 06/23/19 1432 06/23/19 1459   06/20/19 0000  ceFAZolin (ANCEF) IVPB     2 g Intravenous Every 8 hours 06/20/19 1701 07/29/19 2359   06/11/19 2200  ceFAZolin (ANCEF) IVPB 2g/100 mL premix     2 g 200 mL/hr over 30 Minutes Intravenous Every 8 hours 06/11/19 1456     06/11/19 0200  vancomycin (VANCOCIN) 1,750 mg in sodium chloride 0.9 % 500 mL IVPB  Status:  Discontinued     1,750 mg 250 mL/hr over 120 Minutes Intravenous Every 12 hours 06/10/19 1257 06/11/19 1456   06/10/19 2200  ceFEPIme (MAXIPIME) 2 g in sodium chloride 0.9 % 100 mL IVPB  Status:  Discontinued     2 g 200 mL/hr over 30 Minutes Intravenous Every 8 hours 06/10/19 1257 06/10/19 1740   06/10/19 1745  cefTRIAXone (ROCEPHIN) 1 g in sodium chloride 0.9 % 100 mL IVPB  Status:  Discontinued     1 g 200 mL/hr over 30 Minutes Intravenous Every 24 hours 06/10/19 1740 06/11/19 1456   06/10/19 1245  ceFEPIme (MAXIPIME) 2 g in sodium chloride 0.9 % 100 mL IVPB     2 g 200 mL/hr over  30 Minutes Intravenous  Once 06/10/19 1239 06/10/19 1411   06/10/19 1245  vancomycin (VANCOCIN) IVPB 1000 mg/200 mL premix  Status:  Discontinued     1,000 mg 200 mL/hr over 60 Minutes Intravenous  Once 06/10/19 1239 06/10/19 1241   06/10/19 1245  vancomycin (VANCOCIN) 2,500 mg in sodium chloride 0.9 % 500 mL IVPB     2,500 mg 250 mL/hr over 120 Minutes Intravenous  Once 06/10/19 1241 06/10/19 1635      Subjective: Patient appeared frustrated today due to prolonged hospitalization and feels that things could have been done earlier such as early surgery rather than waiting this long.  Slept well last night.  No significant pain at left hip surgical site.  Objective: Vitals:   06/26/19 2011 06/26/19 2100 06/27/19 0200 06/27/19 0719  BP: 132/76   (!) 137/54  Pulse: 90   70  Resp: 12  17  16  Temp: 98 F (36.7 C)   (!) 97.5 F (36.4 C)  TempSrc: Oral   Oral  SpO2: 96%   98%  Weight:  (!) 179.3 kg    Height:        Intake/Output Summary (Last 24 hours) at 06/27/2019 1656 Last data filed at 06/27/2019 1600 Gross per 24 hour  Intake 1573 ml  Output 920 ml  Net 653 ml   Filed Weights   06/25/19 0400 06/25/19 2043 06/26/19 2100  Weight: (!) 171.3 kg (!) 171.3 kg (!) 179.3 kg    Examination: General exam: Pleasant young male, moderately built and morbidly obese lying comfortably supine in bed without distress. Respiratory system: Clear to auscultation.  No increased work of breathing. Cardiovascular system: S1 and S2 heard, RRR.  No JVD, murmurs.  1+ pitting edema of both legs. Gastrointestinal system: Nondistended, soft and nontender.  No organomegaly or masses appreciated.  Normal bowel sounds heard. Central nervous system: Alert and oriented x3.  No focal neurological deficits. Extremities: Symmetric 5 x 5 power. Skin: Left hip/gluteal area wound VAC intact draining bloody fluid.  Also has a Hemovac.  No features suggestive of compartment syndrome of left lower  extremity. Psychiatry: Mood normal // no visual hallucinations   Data Reviewed: I have personally reviewed following labs and imaging studies  CBC: Recent Labs  Lab 06/23/19 0254 06/24/19 0441 06/25/19 0351 06/26/19 0429 06/27/19 0450  WBC 13.8* 13.6* 7.2 8.7 6.5  HGB 11.3* 10.7* 9.3* 8.7* 7.4*  HCT 33.9* 32.7* 28.5* 25.8* 22.6*  MCV 93.6 95.6 96.0 93.8 96.2  PLT 142* 153 116* 128* 917*   Basic Metabolic Panel: Recent Labs  Lab 06/24/19 0441 06/24/19 1040 06/25/19 0351 06/26/19 0429 06/27/19 0450  NA 131* 130* 133* 132* 131*  K 5.5* 4.8 4.2 5.1 4.3  CL 102 102 103 101 101  CO2 23 23 25 23 25   GLUCOSE 172* 179* 164* 274* 227*  BUN 20 19 16 19 18   CREATININE 0.74 0.71 0.66 0.66 0.57*  CALCIUM 7.7* 7.6* 7.8* 7.8* 7.7*   GFR: Estimated Creatinine Clearance: 187 mL/min (A) (by C-G formula based on SCr of 0.57 mg/dL (L)). Liver Function Tests: No results for input(s): AST, ALT, ALKPHOS, BILITOT, PROT, ALBUMIN in the last 168 hours. No results for input(s): LIPASE, AMYLASE in the last 168 hours. No results for input(s): AMMONIA in the last 168 hours. Coagulation Profile: Recent Labs  Lab 06/22/19 0356  INR 1.3*   CBG: Recent Labs  Lab 06/26/19 1603 06/26/19 2126 06/27/19 0717 06/27/19 1114 06/27/19 1624  GLUCAP 237* 247* 191* 201* 141*     No results found for this or any previous visit (from the past 240 hour(s)).   Radiology Studies: No results found.  Scheduled Meds: . Chlorhexidine Gluconate Cloth  6 each Topical Daily  . cholecalciferol  2,000 Units Oral Daily  . docusate sodium  100 mg Oral BID  . insulin aspart  0-15 Units Subcutaneous TID WC  . insulin aspart  0-5 Units Subcutaneous QHS  . insulin aspart  8 Units Subcutaneous TID WC  . insulin glargine  24 Units Subcutaneous Daily  . loratadine  10 mg Oral Daily  . multivitamin with minerals  1 tablet Oral Daily  . Ensure Max Protein  11 oz Oral Daily  . sodium chloride flush  10-40 mL  Intracatheter Q12H  . sodium chloride flush  5 mL Intracatheter Q8H  . vitamin C  1,000 mg Oral Daily   Continuous  Infusions: .  ceFAZolin (ANCEF) IV 2 g (06/27/19 1321)     LOS: 17 days   Vernell Leep, MD, [place], [facility]. [facility]  To contact the attending provider between 7A-7P or the covering provider during after hours 7P-7A, please log into the web site [email or web address] and access using universal [place] password for that web site. If you do not have the password, please call the hospital operator.

## 2019-06-28 LAB — COMPREHENSIVE METABOLIC PANEL
ALT: 28 U/L (ref 0–44)
AST: 96 U/L — ABNORMAL HIGH (ref 15–41)
Albumin: 1.3 g/dL — ABNORMAL LOW (ref 3.5–5.0)
Alkaline Phosphatase: 220 U/L — ABNORMAL HIGH (ref 38–126)
Anion gap: 6 (ref 5–15)
BUN: 18 mg/dL (ref 6–20)
CO2: 27 mmol/L (ref 22–32)
Calcium: 7.8 mg/dL — ABNORMAL LOW (ref 8.9–10.3)
Chloride: 102 mmol/L (ref 98–111)
Creatinine, Ser: 0.62 mg/dL (ref 0.61–1.24)
GFR calc Af Amer: >60 mL/min (ref >60)
GFR calc non Af Amer: >60 mL/min (ref >60)
Glucose, Bld: 193 mg/dL — ABNORMAL HIGH (ref 70–99)
Potassium: 3.9 mmol/L (ref 3.5–5.1)
Sodium: 135 mmol/L (ref 135–145)
Total Bilirubin: 0.5 mg/dL (ref 0.3–1.2)
Total Protein: 5.1 g/dL — ABNORMAL LOW (ref 6.5–8.1)

## 2019-06-28 LAB — CBC
HCT: 23.9 % — ABNORMAL LOW (ref 39.0–52.0)
Hemoglobin: 7.9 g/dL — ABNORMAL LOW (ref 13.0–17.0)
MCH: 31.9 pg (ref 26.0–34.0)
MCHC: 33.1 g/dL (ref 30.0–36.0)
MCV: 96.4 fL (ref 80.0–100.0)
Platelets: 119 10*3/uL — ABNORMAL LOW (ref 150–400)
RBC: 2.48 MIL/uL — ABNORMAL LOW (ref 4.22–5.81)
RDW: 16.7 % — ABNORMAL HIGH (ref 11.5–15.5)
WBC: 6.6 10*3/uL (ref 4.0–10.5)
nRBC: 0 % (ref 0.0–0.2)

## 2019-06-28 LAB — GLUCOSE, CAPILLARY
Glucose-Capillary: 154 mg/dL — ABNORMAL HIGH (ref 70–99)
Glucose-Capillary: 152 mg/dL — ABNORMAL HIGH (ref 70–99)
Glucose-Capillary: 195 mg/dL — ABNORMAL HIGH (ref 70–99)
Glucose-Capillary: 150 mg/dL — ABNORMAL HIGH (ref 70–99)

## 2019-06-28 MED ORDER — ZOLPIDEM TARTRATE 5 MG PO TABS
5.0000 mg | ORAL_TABLET | Freq: Every evening | ORAL | Status: DC | PRN
  Administered 2019-06-28 – 2019-06-29 (×2): 5 mg via ORAL
  Filled 2019-06-28 (×2): qty 1

## 2019-06-28 NOTE — Progress Notes (Addendum)
PROGRESS NOTE   Robert Sullivan  EUM:[id number]    DOB: 10-22-1965    DOA: 06/10/2019  PCP: Cathleen Corti, PA-C   I have briefly reviewed patients previous medical records in [facility].  Chief Complaint:  Fever, chills, worsening pain and swelling of left gluteal region  Brief Narrative:  53 year old married male, works to hire truck drivers for [employer], PMH of uncontrolled DM2, morbid obesity, OSA, recent multiple falls at home, hospitalized 05/12/2019-05/17/2019 for left hip pain after a fall in the bathtub, sepsis due to UTI, poorly controlled DM, MRI of left hip showed tear of the left gluteus maximus with 50 mL hematoma not amenable to surgical correction, was discharged on Lantus, NovoLog, Bactrim.  He felt well initially but then developed worsening left gluteal pain, fever, chills and dark urine.  Initially admitted for suspected sepsis due to UTI and left gluteal cellulitis versus abscess.   Assessment & Plan:  Principal Problem:   MSSA bacteremia Active Problems:   Acute lower UTI   Sepsis ([place])   Uncontrolled diabetes mellitus (HCC)   Obesity, Class III, BMI 40-49.9 (morbid obesity) (HCC)   Cellulitis, gluteal, left   Sepsis due to MSSA bacteremia with disseminated infection (UTI and left gluteal abscess).  Sepsis present on admission.  Sepsis resolved.  Surveillance blood cultures x2 from 12/2: Negative.  ID was consulted and assisted with evaluation and management.  TTE and TEE (12/7) negative for vegetations.  ID recommended IV cefazolin for 6 weeks with end date of July 30, 2019 and outpatient follow-up with ID in 4 to 6 weeks.  Left 12/11.  Has RUE PICC line  MSSA UTI  In the context of disseminated MSSA infection.  Completed antibiotics for this indication but remains on long-term IV cefazolin.  Asymptomatic of UTI symptoms.  MSSA large complex left gluteal abscess, complicating recent hematoma.  Initial left hip CT scan suggested  myositis only without a distinct abscess but this was a noncontrasted study.  ID initially recommended orthopedic consultation on 12/2, given bacteremia, severe left hip pain with inability to ambulate or weight-bear, recent history of left hip hematoma from fall.  MRI left hip 12/4: Showed large multilocular septated cystic mass (approximately 22 cm in diameter) within gluteal musculature extending into the trochanteric bursa which could be chronic organizing hematoma/seroma or intramuscular abscess.  No OM. Following this ID on 12/6 recommended orthopedic evaluation for debridement of gluteal abscess.  Orthopedics first evaluated patient on 12/7 and recommended IR aspiration, likely drain and if this an antibiotic failed then operative I&D.  IR consulted on 12/8, ultrasound-guided 10 French drain placed into left gluteal abscess yielding 400 mL pink purulent fluid.  Abscess culture confirmed MSSA.  ID follow-up 12/8 recommended open I&D.  Despite the drain, repeat CT pelvis with contrast on 12/11 showed persistent large left gluteal abscess collection 9.5 x 5.7 cm, 16.6 cm length.  Additional thinner contiguous abscess collection extending more anteriorly and descending laterally into the lateral left leg 7.8 x 1.9 in axial dimensions, 15.5 cm length extending deep to muscular fascia.  Underwent first I&D by orthopedics on 12/14, drained large amount of purulent fluid, EBL 150 mL.  Due to the complexity of his abscess and the location, a repeat irrigation and debridement was performed by orthopedic traumatologist on 12/16.  EBL 500 mL.  Postop has wound VAC and Hemovac drain in place.  Discussed with orthopedic and as per their follow-up today, minimal drainage overnight via Hemovac or wound VAC.  Pain better controlled, mobilizing better.  Plan to remove incisional VAC and Hemovac 12/20, WBAT LLE, outpatient follow-up in office 2 weeks from discharge  Postop acute blood loss anemia  Hemoglobin  dropped from baseline of about 12 g to low of 7.4 on 12/18.  Likely due to surgical blood loss.  Hemoglobin has improved to 7.9 today.  Continue to follow CBCs and transfuse if hemoglobin 7 g or less but will likely not need it.  Thrombocytopenia  Secondary to acute illness/sepsis and acute blood loss anemia.  Stable.  Follow CBC.  Poorly controlled type II DM with hyperglycemia  Poorly controlled at home for the last couple of months in the context of significant social and financial stressors leading to poor compliance.  Prior to recent hospitalization, he was using a friend's NovoLog insulin pen.  Although he was discharged at that time on Lantus and NovoLog, he reportedly did not receive these medications at discharge.  CBGs much better controlled today as below on current regimen of Lantus 24 units daily, NovoLog meal 8 units 3 times daily and moderate SSI with bedtime scale.  Last seen by diabetes coordinator on 12/3.  Will reconsult to see if current regimen would be better than 70/30 insulin (patient lacks insurance).  If Lantus and NovoLog preferred then will have to get through [place].  We will also consult case management.  A1c 11 on 05/13/2019  Suspected cirrhosis  Noted on imaging.  Denied alcohol use currently but did drinks regularly in the past.  Suspected NASH/fatty liver as etiology but recommend outpatient GI consultation.  HCV antibodies and HIV screen nonreactive.  Addendum:  - This addendum is made in response to the patient's written request to the Health Information Management (HIM) Department. He disputes the statement made in the chart notes stating that he previously used alcohol or drugs. He indicated to HIM that he has Non Alcoholic Cirrhosis and has never done alcohol or drugs. - I cannot recollect if I obtained the information pertaining to substance use directly from him or the information was pulled from prior providers notes.  - Based on the  written information the patient provided to the HIM department, the notes are hereby amended to reflect that the patient reports no drug use or alcohol use.  Frequent falls  As per PT evaluation 12/17, home health PT and bariatric bedside commode.  Constipation   Aggressive bowel regimen.  Body mass index is 48.5 kg/m./Morbid obesity/OSA  Diet/lifestyle modification recommended.  Nutritional Status Nutrition Problem: Increased nutrient needs Etiology: acute illness Signs/Symptoms: estimated needs Interventions: Premier Protein, MVI   Hyperkalemia  Developed postoperatively on 12/15.  Skin rash  Felt to be related to sulfa antibiotics.  Topical steroids recommended.  Resolved.  Microscopic hematuria  Unclear etiology.  Recommend repeating urine microscopy in 3 to 4 weeks as outpatient and if persists then needs further evaluation.   DVT prophylaxis: SCD Code Status: Full Family Communication: I discussed in detail with patient spouse via phone on 12/18, updated care and answered all questions.  None at bedside today. Disposition: DC home pending further clinical improvement, hopefully in the next 1 to 2 days.   Consultants:   Infectious disease Interventional radiology Cardiology Orthopedic surgery  Procedures:    TEE 12/7  IR guided drain placement to L thigh 12/8  Incision and Drainage of Gluteal abscess 06/23/19 & 12/16  Antimicrobials:   IV ceftriaxone x1 dose on 12/1 IV cefepime x1 dose on 12/1 IV vancomycin 12/1-12/2. IV  cefazolin 12/2 >   Subjective:  Continues to feel better.  Not much pain at surgical left hip site.  If it was not for the drains in the tubes, indicates that he would be able to ambulate better.  Urinated quite a bit after dose of IV Lasix yesterday.  Does not want any more Lasix.  Had BM.  Objective:   Vitals:   06/27/19 0719 06/27/19 1948 06/27/19 2101 06/28/19 0440  BP: (!) 137/54  136/73 131/78  Pulse: 70  81 80  Resp:  16  16 12   Temp: (!) 97.5 F (36.4 C)  97.6 F (36.4 C) 97.6 F (36.4 C)  TempSrc: Oral  Oral Oral  SpO2: 98%  100% 98%  Weight:  (!) 176 kg    Height:        General exam: Pleasant young male, moderately built and morbidly obese lying comfortably supine in bed without distress. Respiratory system: Clear to auscultation. Respiratory effort normal. Cardiovascular system: S1 & S2 heard, RRR. No JVD, murmurs, rubs, gallops or clicks.  1+ pitting bilateral leg edema, slightly improved compared to 12/18. Gastrointestinal system: Abdomen is nondistended, soft and nontender. No organomegaly or masses felt. Normal bowel sounds heard. Central nervous system: Alert and oriented. No focal neurological deficits. Extremities: Symmetric 5 x 5 power. Skin: Left hip/gluteal site postop dressing/wound VAC without acute findings.  Also has Hemovac.  Neither has significant output currently. Psychiatry: Judgement and insight appear normal. Mood & affect appropriate.     Data Reviewed:   I have personally reviewed following labs and imaging studies   CBC: Recent Labs  Lab 06/26/19 0429 06/27/19 0450 06/28/19 0412  WBC 8.7 6.5 6.6  HGB 8.7* 7.4* 7.9*  HCT 25.8* 22.6* 23.9*  MCV 93.8 96.2 96.4  PLT 128* 111* 119*    Basic Metabolic Panel: Recent Labs  Lab 06/26/19 0429 06/27/19 0450 06/28/19 0412  NA 132* 131* 135  K 5.1 4.3 3.9  CL 101 101 102  CO2 23 25 27   GLUCOSE 274* 227* 193*  BUN 19 18 18   CREATININE 0.66 0.57* 0.62  CALCIUM 7.8* 7.7* 7.8*    Liver Function Tests: Recent Labs  Lab 06/28/19 0412  AST 96*  ALT 28  ALKPHOS 220*  BILITOT 0.5  PROT 5.1*  ALBUMIN 1.3*    CBG: Recent Labs  Lab 06/27/19 2056 06/28/19 0649 06/28/19 1149  GLUCAP 149* 154* 152*    Microbiology Studies:  No results found for this or any previous visit (from the past 240 hour(s)).   Radiology Studies:  No results found.   Scheduled Meds:   . Chlorhexidine Gluconate Cloth  6  each Topical Daily  . cholecalciferol  2,000 Units Oral Daily  . docusate sodium  100 mg Oral BID  . insulin aspart  0-15 Units Subcutaneous TID WC  . insulin aspart  0-5 Units Subcutaneous QHS  . insulin aspart  8 Units Subcutaneous TID WC  . insulin glargine  24 Units Subcutaneous Daily  . loratadine  10 mg Oral Daily  . multivitamin with minerals  1 tablet Oral Daily  . polyethylene glycol  17 g Oral Daily  . Ensure Max Protein  11 oz Oral Daily  . sodium chloride flush  10-40 mL Intracatheter Q12H  . sodium chloride flush  5 mL Intracatheter Q8H  . vitamin C  1,000 mg Oral Daily    Continuous Infusions:   .  ceFAZolin (ANCEF) IV 2 g (06/28/19 0455)     LOS:  18 days     Vernell Leep, MD, [place], [facility]. [facility]    To contact the attending provider between 7A-7P or the covering provider during after hours 7P-7A, please log into the web site [email or web address] and access using universal [place] password for that web site. If you do not have the password, please call the hospital operator.  06/28/2019, 3:32 PM

## 2019-06-28 NOTE — Progress Notes (Signed)
ORTHOPAEDIC PROGRESS NOTE  s/p Procedure(s): IRRIGATION AND DEBRIDEMENT HIP on 06/25/2019  SUBJECTIVE: He is feeling better day by day. Pain has been manageable. He is trying to wean off narcotics. Per nursing staff, hemovac with minimal drainage over night and patient requiring less pain medication. He is anxious to get home. No chest pain. No SOB. No nausea/vomiting. No other complaints.  OBJECTIVE: PE: General - Sitting up in bed, no acute distress Respiratory - No increased work of breathing.  Left lower extremity: incisional VAC in place with good seal and function.  Hemovac with minimal output.  Minimal tenderness with palpation over posterior hip, gluteals, thigh area.   Ankle dorsiflexion/plantarflexion intact.  Motor and sensory function intact distally.  Neurovascularly intact.  Vitals:   06/27/19 2101 06/28/19 0440  BP: 136/73 131/78  Pulse: 81 80  Resp: 16 12  Temp: 97.6 F (36.4 C) 97.6 F (36.4 C)  SpO2: 100% 98%     ASSESSMENT: Robert Sullivan is a 53 y.o. male doing well postoperatively.  PLAN: Weightbearing: WBAT LLE Insicional and dressing care: Plan to remove incisional VAC and hemovac tomorrow prior to discharge if minimal drainage Orthopedic device(s): Wound Vac: Left lower extremity VTE prophylaxis: per primary team Pain control: 1. Tylenol 650 mg q 6 hours scheduled 2. Percocet 5-325 q 4 hours PRN 3 Dilaudid 0.5 mg q 4 hours PRN Follow - up plan: 2 weeks after hospital discharge  CV/Blood loss: Acute blood loss anemia, Hgb 7.9 this morning. Hemodynamically stable. Continue to monitor CBC. Per medicine, will transfuse if Hgb less than 7.  Discussed case with Dr. Algis Liming - plan for discharge tomorrow morning, if cleared medically. Will remove vacs in the AM and apply a new dressing.    Noemi Chapel, PA-C 06/28/2019

## 2019-06-28 NOTE — Progress Notes (Signed)
Inpatient Diabetes Program Recommendations  AACE/ADA: New Consensus Statement on Inpatient Glycemic Control (2015)  Target Ranges:  Prepandial:   less than 140 mg/dL      Peak postprandial:   less than 180 mg/dL (1-2 hours)      Critically ill patients:  140 - 180 mg/dL   Results for DILRAJ, KILLGORE (MRN [id number]) as of 06/28/2019 16:59  Ref. Range 06/27/2019 07:17 06/27/2019 11:14 06/27/2019 16:24 06/27/2019 20:56  Glucose-Capillary Latest Ref Range: 70 - 99 mg/dL 191 (H)  11 units NOVOLOG    12 units NOVOLOG  141 (H)  10 units NOVOLOG  149 (H)    24 units LANTUS   Results for ARIES, TOWNLEY (MRN [id number]) as of 06/28/2019 16:59  Ref. Range 06/28/2019 06:49 06/28/2019 11:49  Glucose-Capillary Latest Ref Range: 70 - 99 mg/dL 154 (H)  11 units NOVOLOG  152 (H)  11 units NOVOLOG     Patient states he was taking Novolog insulin that was given to him by a friend and was using an insulin pen.  Patient chart listed patient received Lantus and Novolog on discharge from pharmacy transitions of care, but patient states he didn't receive on discharge.    Current Insulin Orders: Lantus 24 units QHS         Novolog Moderate Correction Scale/ SSI (0-15 units) TID AC + HS       Novolog 8 units TID with meals    MD- TOC pharmacy likely NOT able to fill meds on Sunday (I think they are closed over the weekends).  Not sure if patient eligible for [facility]??  Latest TOC team note does not mention anything about the [facility] letter.  If you find out pt able to get Rxs with a [place] letter, then I would recommend discharging home on Lantus and Novolog.  Current regimen working well.  You could d/c home on the Lantus QHS + Novolog 8 units TID with meals.  If patient not eligible for MATCH, will likely not be able to afford Lantus and Novolog out of pocket and TOC pharmacy not open on the weekends.  Would recommend transition to Reli-On 70/30 Insulin pens at the following doses: 70/30 Insulin  20 units BID with meals This would provide 28 units long-acting insulin split into 2 doses over the day and 6 units quick acting insulin with each dose  Reli-On 70/30 Insulin pens- Order # [id number]  Insulin Pen Needles- Order [phone]     --Will follow patient during hospitalization--  Wyn Quaker RN, MSN, CDE Diabetes Coordinator Inpatient Glycemic Control Team Team Pager: [phone] (8a-5p)

## 2019-06-29 LAB — CBC
HCT: 26.7 % — ABNORMAL LOW (ref 39.0–52.0)
Hemoglobin: 8.9 g/dL — ABNORMAL LOW (ref 13.0–17.0)
MCH: 31.8 pg (ref 26.0–34.0)
MCHC: 33.3 g/dL (ref 30.0–36.0)
MCV: 95.4 fL (ref 80.0–100.0)
Platelets: 125 10*3/uL — ABNORMAL LOW (ref 150–400)
RBC: 2.8 MIL/uL — ABNORMAL LOW (ref 4.22–5.81)
RDW: 17.8 % — ABNORMAL HIGH (ref 11.5–15.5)
WBC: 10.8 10*3/uL — ABNORMAL HIGH (ref 4.0–10.5)
nRBC: 0 % (ref 0.0–0.2)

## 2019-06-29 LAB — COMPREHENSIVE METABOLIC PANEL
ALT: 33 U/L (ref 0–44)
AST: 105 U/L — ABNORMAL HIGH (ref 15–41)
Albumin: 1.7 g/dL — ABNORMAL LOW (ref 3.5–5.0)
Alkaline Phosphatase: 260 U/L — ABNORMAL HIGH (ref 38–126)
Anion gap: 10 (ref 5–15)
BUN: 21 mg/dL — ABNORMAL HIGH (ref 6–20)
CO2: 20 mmol/L — ABNORMAL LOW (ref 22–32)
Calcium: 8.1 mg/dL — ABNORMAL LOW (ref 8.9–10.3)
Chloride: 103 mmol/L (ref 98–111)
Creatinine, Ser: 0.6 mg/dL — ABNORMAL LOW (ref 0.61–1.24)
GFR calc Af Amer: >60 mL/min (ref >60)
GFR calc non Af Amer: >60 mL/min (ref >60)
Glucose, Bld: 269 mg/dL — ABNORMAL HIGH (ref 70–99)
Potassium: 3.7 mmol/L (ref 3.5–5.1)
Sodium: 133 mmol/L — ABNORMAL LOW (ref 135–145)
Total Bilirubin: 1.1 mg/dL (ref 0.3–1.2)
Total Protein: 6.3 g/dL — ABNORMAL LOW (ref 6.5–8.1)

## 2019-06-29 LAB — GLUCOSE, CAPILLARY
Glucose-Capillary: 233 mg/dL — ABNORMAL HIGH (ref 70–99)
Glucose-Capillary: 194 mg/dL — ABNORMAL HIGH (ref 70–99)
Glucose-Capillary: 161 mg/dL — ABNORMAL HIGH (ref 70–99)
Glucose-Capillary: 145 mg/dL — ABNORMAL HIGH (ref 70–99)

## 2019-06-29 MED ORDER — SENNA 8.6 MG PO TABS
2.0000 | ORAL_TABLET | Freq: Every day | ORAL | Status: DC
  Filled 2019-06-29: qty 2

## 2019-06-29 MED ORDER — POLYETHYLENE GLYCOL 3350 17 G PO PACK: 17.0000 g | PACK | Freq: Two times a day (BID) | ORAL | Status: DC

## 2019-06-29 NOTE — Progress Notes (Signed)
ORTHOPAEDIC PROGRESS NOTE  s/p Procedure(s): IRRIGATION AND DEBRIDEMENT HIP on 06/25/2019  SUBJECTIVE: He is having difficulty with bowel movement today. He is anxious get home. Pain is minimal No chest pain. No SOB. No nausea/vomiting. No other complaints.  OBJECTIVE: PE: General - Sitting up in bed, no acute distress Respiratory - No increased work of breathing.  Left lower extremity: incisional VAC in place with good seal and function.  Hemovac with minimal output, no increase of drainage since yesterday.  Minimal tenderness with palpation over posterior hip, gluteals, thigh area.   Ankle dorsiflexion/plantarflexion intact.  Motor and sensory function intact distally.  Neurovascularly intact. VACs were removed. No drainage noted. Incision sites benign.   Vitals:   06/28/19 2225 06/29/19 0500  BP: 123/70 135/75  Pulse: 85 79  Resp: 19 18  Temp: 98.3 F (36.8 C) 98.2 F (36.8 C)  SpO2: 96% 99%    ASSESSMENT: Robert Sullivan is a 53 y.o. male status post I&D left hip on 06/25/2019 by Dr. Doreatha Lavere.   PLAN: Weightbearing: WBAT LLE Insicional and dressing care: Reinforce dressing as needed. VACs were removed - minimal to no drainage noted over past 2 days. New bulky dressing placed over left hip.  Orthopedic device(s): None VTE prophylaxis: per primary team Pain control: 1. Tylenol 650 mg q 6 hours scheduled 2. Percocet 5-325 q 4 hours PRN 3 Dilaudid 0.5 mg q 4 hours PRN Follow - up plan: 2 weeks after hospital discharge with Dr. Doreatha Kelechi  CV/Blood loss: Acute blood loss anemia, Hgb 7.9 yesterday. Hemodynamically stable. Continue to monitor CBC. Per medicine, will transfuse if Hgb less than 7. CBC from today pending.   Okay for discharge from orthopedics standpoint once cleared by medicine team and therapies.    Noemi Chapel, PA-C 06/29/2019

## 2019-06-29 NOTE — Progress Notes (Addendum)
PROGRESS NOTE   Robert Sullivan  NFA:[id number]    DOB: 03-Aug-1965    DOA: 06/10/2019  PCP: Cathleen Corti, PA-C   I have briefly reviewed patients previous medical records in [facility].  Chief Complaint:  Fever, chills, worsening pain and swelling of left gluteal region  Brief Narrative:  53 year old married male, works to hire truck drivers for [employer], PMH of uncontrolled DM2, morbid obesity, OSA, recent multiple falls at home, hospitalized 05/12/2019-05/17/2019 for left hip pain after a fall in the bathtub, sepsis due to UTI, poorly controlled DM, MRI of left hip showed tear of the left gluteus maximus with 50 mL hematoma not amenable to surgical correction, was discharged on Lantus, NovoLog, Bactrim.  He felt well initially but then developed worsening left gluteal pain, fever, chills and dark urine.  He was admitted for sepsis due to MSSA bacteremia with disseminated infection (UTI and left gluteal abscess).  ID and orthopedics consulted.  S/p I&D of left gluteal abscess x2.  Ongoing prolonged IV antibiotic therapy.  Improved.   Assessment & Plan:  Principal Problem:   MSSA bacteremia Active Problems:   Acute lower UTI   Sepsis ([place])   Uncontrolled diabetes mellitus (HCC)   Obesity, Class III, BMI 40-49.9 (morbid obesity) (HCC)   Cellulitis, gluteal, left   Sepsis due to MSSA bacteremia with disseminated infection (UTI and left gluteal abscess).  Sepsis present on admission.  Sepsis resolved.  Surveillance blood cultures x2 from 12/2: Negative.  ID was consulted and assisted with evaluation and management.  TTE and TEE (12/7) negative for vegetations.  ID recommended IV cefazolin for 6 weeks with end date of July 30, 2019 and outpatient follow-up with ID in 4 to 6 weeks.  Left 12/11.  Has RUE PICC line  Discussed in detail with case manager on call 12/20 who will assist with PCP follow-up at: Clinic who can also fill his medications, [facility] will  provide IV antibiotic/RN respectively for care at home, medications at discharge to be filled at [place] on 12/21.  MSSA UTI  In the context of disseminated MSSA infection.  Completed antibiotics for this indication but remains on long-term IV cefazolin.  Asymptomatic of UTI symptoms.  MSSA large complex left gluteal abscess, complicating recent hematoma.  Initial left hip CT scan suggested myositis only without a distinct abscess but this was a noncontrasted study.  ID initially recommended orthopedic consultation on 12/2, given bacteremia, severe left hip pain with inability to ambulate or weight-bear, recent history of left hip hematoma from fall.  MRI left hip 12/4: Showed large multilocular septated cystic mass (approximately 22 cm in diameter) within gluteal musculature extending into the trochanteric bursa which could be chronic organizing hematoma/seroma or intramuscular abscess.  No OM. Following this ID on 12/6 recommended orthopedic evaluation for debridement of gluteal abscess.  Orthopedics first evaluated patient on 12/7 and recommended IR aspiration, likely drain and if this an antibiotic failed then operative I&D.  IR consulted on 12/8, ultrasound-guided 10 French drain placed into left gluteal abscess yielding 400 mL pink purulent fluid.  Abscess culture confirmed MSSA.  ID follow-up 12/8 recommended open I&D.  Despite the drain, repeat CT pelvis with contrast on 12/11 showed persistent large left gluteal abscess collection 9.5 x 5.7 cm, 16.6 cm length.  Additional thinner contiguous abscess collection extending more anteriorly and descending laterally into the lateral left leg 7.8 x 1.9 in axial dimensions, 15.5 cm length extending deep to muscular fascia.  Underwent first  I&D by orthopedics on 12/14, drained large amount of purulent fluid, EBL 150 mL.  Due to the complexity of his abscess and the location, a repeat irrigation and debridement was performed by orthopedic  traumatologist on 12/16.  EBL 500 mL.  Postop has wound VAC and Hemovac drain in place.  Orthopedics have removed wound VAC and Hemovac 12/20. WBAT LLE, outpatient follow-up in office 2 weeks from discharge  Postop acute blood loss anemia  Hemoglobin dropped from baseline of about 12 g to low of 7.4 on 12/18.  Likely due to surgical blood loss.  Hemoglobin has improved to 8.9 today.  Thrombocytopenia  Secondary to acute illness/sepsis and acute blood loss anemia.  Improving and up to 125 today.  Poorly controlled type II DM with hyperglycemia  Poorly controlled at home for the last couple of months in the context of significant social and financial stressors leading to poor compliance.  Prior to recent hospitalization, he was using a friend's NovoLog insulin pen.  Although he was discharged at that time on Lantus and NovoLog, he reportedly did not receive these medications at discharge.  CBGs much better controlled today as below on current regimen of Lantus 24 units daily, NovoLog meal 8 units 3 times daily and moderate SSI with bedtime scale.  As per DM coordinator, Lantus and NovoLog would be better if he can procure.  CM has assisted with medications at discharge via [place].  Subsequent medications to be filled at [facility].  A1c 11 on 05/13/2019  Suspected cirrhosis  Noted on imaging.  Denied alcohol use currently but did drinks regularly in the past.  Suspected NASH/fatty liver as etiology but recommend outpatient GI consultation.  HCV antibodies and HIV screen nonreactive.  Addendum:  - This addendum is made in response to the patient's written request to the Health Information Management (HIM) Department. He disputes the statement made in the chart notes stating that he previously used alcohol or drugs. He indicated to HIM that he has Non Alcoholic Cirrhosis and has never done alcohol or drugs. - I cannot recollect if I obtained the information pertaining to  substance use directly from him or the information was pulled from prior providers notes.  - Based on the written information the patient provided to the HIM department, the notes are hereby amended to reflect that the patient reports no drug use or alcohol use.  Frequent falls  As per PT evaluation 12/17, home health PT and bariatric bedside commode.  Constipation   Aggressive bowel regimen, however patient refuses bowel regimen per nursing.  Having small BMs.  Body mass index is 48.5 kg/m./Morbid obesity/OSA  Diet/lifestyle modification recommended.  Nutritional Status Nutrition Problem: Increased nutrient needs Etiology: acute illness Signs/Symptoms: estimated needs Interventions: Premier Protein, MVI   Hyperkalemia  Developed postoperatively on 12/15.  Skin rash  Felt to be related to sulfa antibiotics.  Topical steroids recommended.  Resolved.  Microscopic hematuria  Unclear etiology.  Recommend repeating urine microscopy in 3 to 4 weeks as outpatient and if persists then needs further evaluation.   DVT prophylaxis: SCD Code Status: Full Family Communication: I discussed in detail with patient spouse via phone on 12/18, updated care and answered all questions.  None at bedside today. Disposition: DC home pending further clinical improvement, hopefully 12/21   Consultants:   Infectious disease Interventional radiology Cardiology Orthopedic surgery  Procedures:    TEE 12/7  IR guided drain placement to L thigh 12/8  Incision and Drainage of Gluteal abscess  06/23/19 & 12/16  Antimicrobials:   IV ceftriaxone x1 dose on 12/1 IV cefepime x1 dose on 12/1 IV vancomycin 12/1-12/2. IV cefazolin 12/2 >   Subjective:  Constipation.  Small BMs.  Reluctant to use bowel regimen.  Left hip wound VAC and drain removed, pain controlled with minimal pain medicines.  Asking when he can go home.  Objective:   Vitals:   06/28/19 1100 06/28/19 1712 06/28/19 2225  06/29/19 0500  BP: 133/78 130/74 123/70 135/75  Pulse: 83 86 85 79  Resp: 13 12 19 18   Temp: 98.3 F (36.8 C) (!) 97.5 F (36.4 C) 98.3 F (36.8 C) 98.2 F (36.8 C)  TempSrc: Oral Oral Oral Oral  SpO2: 98% 97% 96% 99%  Weight:      Height:        General exam: Pleasant young male, moderately built and morbidly obese lying comfortably supine in bed without distress. Respiratory system: Clear to auscultation. Respiratory effort normal. Cardiovascular system: S1 & S2 heard, RRR. No JVD, murmurs, rubs, gallops or clicks.  1+ pitting bilateral leg edema, slightly improved compared to 12/18. Gastrointestinal system: Abdomen is nondistended, soft and nontender. No organomegaly or masses felt. Normal bowel sounds heard. Central nervous system: Alert and oriented. No focal neurological deficits. Extremities: Symmetric 5 x 5 power. Skin: Left hip/gluteal site wound VAC and drain removed, thick dressing clean and dry. Psychiatry: Judgement and insight appear normal. Mood & affect appropriate.     Data Reviewed:   I have personally reviewed following labs and imaging studies   CBC: Recent Labs  Lab 06/27/19 0450 06/28/19 0412 06/29/19 1303  WBC 6.5 6.6 10.8*  HGB 7.4* 7.9* 8.9*  HCT 22.6* 23.9* 26.7*  MCV 96.2 96.4 95.4  PLT 111* 119* 125*    Basic Metabolic Panel: Recent Labs  Lab 06/27/19 0450 06/28/19 0412 06/29/19 0350  NA 131* 135 133*  K 4.3 3.9 3.7  CL 101 102 103  CO2 25 27 20*  GLUCOSE 227* 193* 269*  BUN 18 18 21*  CREATININE 0.57* 0.62 0.60*  CALCIUM 7.7* 7.8* 8.1*    Liver Function Tests: Recent Labs  Lab 06/28/19 0412 06/29/19 0350  AST 96* 105*  ALT 28 33  ALKPHOS 220* 260*  BILITOT 0.5 1.1  PROT 5.1* 6.3*  ALBUMIN 1.3* 1.7*    CBG: Recent Labs  Lab 06/28/19 2225 06/29/19 0711 06/29/19 1151  GLUCAP 150* 233* 194*    Microbiology Studies:  No results found for this or any previous visit (from the past 240 hour(s)).   Radiology  Studies:  No results found.   Scheduled Meds:   . Chlorhexidine Gluconate Cloth  6 each Topical Daily  . cholecalciferol  2,000 Units Oral Daily  . docusate sodium  100 mg Oral BID  . insulin aspart  0-15 Units Subcutaneous TID WC  . insulin aspart  0-5 Units Subcutaneous QHS  . insulin aspart  8 Units Subcutaneous TID WC  . insulin glargine  24 Units Subcutaneous Daily  . loratadine  10 mg Oral Daily  . multivitamin with minerals  1 tablet Oral Daily  . polyethylene glycol  17 g Oral Daily  . Ensure Max Protein  11 oz Oral Daily  . sodium chloride flush  10-40 mL Intracatheter Q12H  . sodium chloride flush  5 mL Intracatheter Q8H  . vitamin C  1,000 mg Oral Daily    Continuous Infusions:   .  ceFAZolin (ANCEF) IV 2 g (06/29/19 1401)  LOS: 19 days     Vernell Leep, MD, [place], [facility]. [facility]    To contact the attending provider between 7A-7P or the covering provider during after hours 7P-7A, please log into the web site [email or web address] and access using universal [place] password for that web site. If you do not have the password, please call the hospital operator.  06/29/2019, 3:03 PM

## 2019-06-29 NOTE — Care Management (Addendum)
Spoke w MD and identified the following needs for discharge:  1) PCP (preverably an apt w [facility] to be able to access medication refills) Patient states he is not getting medication assistance from his current provider and is agreeable o go to a [facility]. He will need an appointment made in the next 30 days. Email sent to CMA to schedule.   2) Home Health RN to address L glute hemovac as well as PICC line care and administration of IV Abx  ( Cefazolin 2gm Q8 hrs through Jan 20). HH PT [facility] will provide RN for IV Abx they are setting up private pay with patient. [facility] will provide IV Abx and determine cost with patient.  Per bedside RN today hemovac has been removed, bulky dressing in place, patient states he and his wife can manage dressing change.    3) MATCH to cover Lantus and other medications including override for percocet 5/325. Patient entered in [place] for dates 12/20 to 12/28 with override for percocet. Medication can be filled through [facility] Monday. I have sent a secure chat to [place]. Please follow up.   4) Clarify what patient's copay will be for [facility] and IV Abx if any. Patient is coordinating copayments with [facility].   Barriers- no insurance  Per previous notes, Liaison w [facility] has been consulted for home IV meds. I have left a message with her and will discuss case today.   Anticipated DC will be Monday.

## 2019-06-30 DIAGNOSIS — A4101 Sepsis due to Methicillin susceptible Staphylococcus aureus: Principal | ICD-10-CM

## 2019-06-30 LAB — BASIC METABOLIC PANEL
Anion gap: 8 (ref 5–15)
BUN: 16 mg/dL (ref 6–20)
CO2: 25 mmol/L (ref 22–32)
Calcium: 8.1 mg/dL — ABNORMAL LOW (ref 8.9–10.3)
Chloride: 103 mmol/L (ref 98–111)
Creatinine, Ser: 0.65 mg/dL (ref 0.61–1.24)
GFR calc Af Amer: >60 mL/min (ref >60)
GFR calc non Af Amer: >60 mL/min (ref >60)
Glucose, Bld: 151 mg/dL — ABNORMAL HIGH (ref 70–99)
Potassium: 3.6 mmol/L (ref 3.5–5.1)
Sodium: 136 mmol/L (ref 135–145)

## 2019-06-30 LAB — GLUCOSE, CAPILLARY
Glucose-Capillary: 135 mg/dL — ABNORMAL HIGH (ref 70–99)
Glucose-Capillary: 163 mg/dL — ABNORMAL HIGH (ref 70–99)

## 2019-06-30 LAB — CBC
HCT: 26.9 % — ABNORMAL LOW (ref 39.0–52.0)
Hemoglobin: 8.9 g/dL — ABNORMAL LOW (ref 13.0–17.0)
MCH: 31.7 pg (ref 26.0–34.0)
MCHC: 33.1 g/dL (ref 30.0–36.0)
MCV: 95.7 fL (ref 80.0–100.0)
Platelets: 124 10*3/uL — ABNORMAL LOW (ref 150–400)
RBC: 2.81 MIL/uL — ABNORMAL LOW (ref 4.22–5.81)
RDW: 18.5 % — ABNORMAL HIGH (ref 11.5–15.5)
WBC: 9.1 10*3/uL (ref 4.0–10.5)
nRBC: 0 % (ref 0.0–0.2)

## 2019-06-30 MED ORDER — POLYETHYLENE GLYCOL 3350 17 G PO PACK: 17.0000 g | PACK | Freq: Every day | ORAL | 0 refills | Status: DC | PRN

## 2019-06-30 MED ORDER — HEPARIN SOD (PORK) LOCK FLUSH 100 UNIT/ML IV SOLN
250.0000 [IU] | INTRAVENOUS | Status: AC | PRN
  Administered 2019-06-30: 250 [IU]
  Filled 2019-06-30: qty 2.5

## 2019-06-30 MED ORDER — OXYCODONE-ACETAMINOPHEN 5-325 MG PO TABS: 1.0000 | ORAL_TABLET | Freq: Three times a day (TID) | ORAL | 0 refills | Status: DC | PRN

## 2019-06-30 MED ORDER — INSULIN ASPART 100 UNIT/ML ~~LOC~~ SOLN: 8.0000 [IU] | Freq: Three times a day (TID) | SUBCUTANEOUS | 0 refills | Status: DC

## 2019-06-30 MED ORDER — INSULIN GLARGINE 100 UNIT/ML ~~LOC~~ SOLN: 24.0000 [IU] | Freq: Every day | SUBCUTANEOUS | 0 refills | Status: DC

## 2019-06-30 MED ORDER — INSULIN SYRINGES (DISPOSABLE) U-100 0.5 ML MISC: 0 refills | Status: AC

## 2019-06-30 MED ORDER — BLOOD GLUCOSE MONITOR KIT: PACK | 0 refills | Status: AC

## 2019-06-30 MED ORDER — ENSURE MAX PROTEIN PO LIQD: 11.0000 [oz_av] | Freq: Every day | ORAL | 12 refills | Status: AC

## 2019-06-30 MED ORDER — ADULT MULTIVITAMIN W/MINERALS CH: 1.0000 | ORAL_TABLET | Freq: Every day | ORAL | Status: AC

## 2019-06-30 MED ORDER — DOCUSATE SODIUM 100 MG PO CAPS: 100.0000 mg | ORAL_CAPSULE | Freq: Two times a day (BID) | ORAL | 0 refills | Status: DC

## 2019-06-30 MED ORDER — ACETAMINOPHEN 325 MG PO TABS: 650.0000 mg | ORAL_TABLET | Freq: Three times a day (TID) | ORAL | Status: DC | PRN

## 2019-06-30 MED FILL — DOK 100 MG CAPS: 100 | 5 days supply | Qty: 10 | Fill #0

## 2019-06-30 MED FILL — NovoLOG 100 UNIT/ML SOLN: 100 | 30 days supply | Qty: 10 | Fill #0

## 2019-06-30 MED FILL — LANTUS 100 UNITS/ML VIAL: 100 | 30 days supply | Qty: 10 | Fill #0

## 2019-06-30 MED FILL — OXYCODONE-APAP 5-325MG: 5-325 | 5 days supply | Qty: 15 | Fill #0

## 2019-06-30 MED FILL — POLYETHYLENE GLYCOL 3350 PO: 17 | 14 days supply | Qty: 238 | Fill #0

## 2019-06-30 MED FILL — ULTICARE INS 0.5 ML 30GX1/2: 30G X 1/2" | 30 days supply | Qty: 100 | Fill #0

## 2019-06-30 NOTE — Discharge Summary (Addendum)
Physician Discharge Summary  Ukiah Trawick IWL:[id number] DOB: [birth year]  PCP: No primary care provider on file.  Admitted from: Home Discharged to: Home  Admit date: 06/10/2019 Discharge date: 06/30/2019  Recommendations for Outpatient Follow-up:   Follow-up Information    Haddix, Thomasene Lot, MD. Schedule an appointment as soon as possible for a visit in 2 weeks.   Specialty: Orthopedic Surgery Why: For wound re-check Contact information: [place] [address] [phone]        [facility]. Call.   Specialty: [place] Why: Ask for Gerald Stabs to set up payment arrangements Contact information: [place] [phone]       [facility] Follow up.   Why: The office will call to make arrangements for refills and weekly assessments Contact information: [place] Follow up on 07/17/2019.   Why: 2:30pm. This is a telehealth appointment & you will get a reminder call the day before.  Contact information: [place] [id number] [phone]       Carlyle Basques, MD. Schedule an appointment as soon as possible for a visit in 4 week(s).   Specialty: Infectious Diseases Why: Follow-up regarding infection management. Contact information: [place] [address] [phone]            Home Health: PT and RN.  RN for management of PICC line, IV antibiotics, lab draws per outpatient antibiotic therapy protocol, please forward all labs drawn to Dr. Carlyle Basques, Infectious Disease MD at [facility] for Infectious Disease and to new PCP at [place] center. Equipment/Devices: Patient will be discharging home with right upper extremity PICC line which has to be removed after completion of IV antibiotic course.  Discharge Condition:  Improved and stable CODE STATUS: Full. Diet recommendation: Heart healthy & diabetic diet.  Discharge Diagnoses:  Principal Problem:   MSSA bacteremia Active Problems:   Acute lower UTI   Sepsis ([place])   Uncontrolled diabetes mellitus (HCC)   Obesity, Class III, BMI 40-49.9 (morbid obesity) (HCC)   Cellulitis, gluteal, left   Brief Summary: 53 year old married male, works to hire truck drivers for [employer], PMH of uncontrolled DM2, morbid obesity, OSA, recent multiple falls at home, hospitalized 05/12/2019-05/17/2019 for left hip pain after a fall in the bathtub and treated for sepsis due to UTI, poorly controlled DM, MRI of left hip showed tear of the left gluteus maximus with 50 mL hematoma not amenable to surgical correction, was discharged on Lantus, NovoLog & Bactrim.  He felt well initially but then developed worsening left gluteal pain, fever, chills and dark urine.  He was admitted for sepsis due to MSSA bacteremia with disseminated infection (UTI and left gluteal abscess).  ID and orthopedics consulted.  S/p I&D of left gluteal abscess x2.  Ongoing prolonged IV antibiotic therapy.  Improved.   Assessment & Plan:   Sepsis due to MSSA bacteremia with disseminated infection (UTI and left gluteal abscess).  Sepsis present on admission.  Sepsis resolved.  Surveillance blood cultures x2 from 12/2: Negative.  ID was consulted and assisted with evaluation and management.  TTE and TEE (12/7) negative for vegetations.  ID recommended IV cefazolin for 6 weeks with end date of July 29, 2019 and outpatient follow-up with ID in 4 to 6 weeks.  ID signed off 12/11.  Has RUE PICC  line that is to be removed after IV antibiotic course is completed.  Case management has assisted with outpatient PCP follow-up and for medication refills, home health RN for management of PICC line, antibiotics and labs, will try for home health PT and has arranged for discharge medications through [facility].  MSSA UTI  In the context of disseminated MSSA infection.  Completed antibiotics for this indication but remains on long-term IV cefazolin.  Asymptomatic of UTI symptoms.  MSSA large complex left gluteal abscess, complicating recent hematoma.  Initial left hip CT scan suggested myositis only without a distinct abscess but this was a noncontrasted study.  ID initially recommended orthopedic consultation on 12/2, given bacteremia, severe left hip pain with inability to ambulate or weight-bear, recent history of left hip hematoma from fall.  MRI left hip 12/4: Showed large multilocular septated cystic mass (approximately 22 cm in diameter) within gluteal musculature extending into the trochanteric bursa which could be chronic organizing hematoma/seroma or intramuscular abscess.  No OM. Following this ID on 12/6 recommended orthopedic evaluation for debridement of gluteal abscess.  Orthopedics first evaluated patient on 12/7 and recommended IR aspiration, likely drain and if this an antibiotic failed then operative I&D.  IR consulted on 12/8, ultrasound-guided 10 French drain placed into left gluteal abscess yielding 400 mL pink purulent fluid.  Abscess culture confirmed MSSA.  ID follow-up 12/8 recommended open I&D.  Despite the drain, repeat CT pelvis with contrast on 12/11 showed persistent large left gluteal abscess collection 9.5 x 5.7 cm, 16.6 cm length.  Additional thinner contiguous abscess collection extending more anteriorly and descending laterally into the lateral left leg 7.8 x 1.9 in axial dimensions, 15.5 cm length extending deep to muscular fascia.  Underwent first I&D by orthopedics on 12/14, drained large amount of purulent fluid, EBL 150 mL.  Due to the complexity of his abscess and the location, a repeat irrigation and debridement was performed by orthopedic traumatologist on 12/16.  EBL 500 mL.  Postop has wound VAC and Hemovac drain in place.  Orthopedics have  removed wound VAC and Hemovac 12/20. WBAT LLE, outpatient follow-up in office 2 weeks from discharge  Postop pain is reasonably controlled on oral pain medications.  Using small doses of oral opioids.  Provided short supply of oral Percocet for moderate or severe pain.  Reviewed PDMP and he has not filled any opioids or controlled substances recently.  Postop acute blood loss anemia  Hemoglobin dropped from baseline of about 12 g to low of 7.4 on 12/18.  Likely due to surgical blood loss.  Hemoglobin stable in the high 8 g per DL range.  Thrombocytopenia  Secondary to acute illness/sepsis and acute blood loss anemia.  Improving and stable in the 124-125 range.  Poorly controlled type II DM with hyperglycemia  Poorly controlled at home for the last couple of months in the context of significant social and financial stressors leading to poor compliance.  Prior to recent hospitalization, he was using a friend's NovoLog insulin pen.  Although he was discharged at that time on Lantus and NovoLog, he reportedly did not receive these medications at discharge.  CBGs much better controlled today as below on current regimen of Lantus 24 units daily, NovoLog meal 8 units 3 times daily and moderate SSI with bedtime scale.  As per DM coordinator, Lantus and NovoLog would be better if he can procure.  CM has assisted with medications at discharge via [facility].  Subsequent medications to be filled at  [facility].  A1c 11 on 05/13/2019   At discharge patient will go home on Lantus and mealtime NovoLog alone for simplicity of regimen.  These are to be adjusted outpatient as deemed necessary.  Suspected cirrhosis  Noted on imaging.  Denied alcohol use currently but did drinks regularly in the past.  Suspected NASH/fatty liver as etiology but recommend outpatient GI consultation.  HCV antibodies and HIV screen nonreactive.  Addendum:  - This addendum is made in response to the patient's  written request to the Health Information Management (HIM) Department. He disputes the statement made in the chart notes stating that he previously used alcohol or drugs. He indicated to HIM that he has Non Alcoholic Cirrhosis and has never done alcohol or drugs. - I cannot recollect if I obtained the information pertaining to substance use directly from him or the information was pulled from prior providers notes.  - Based on the written information the patient provided to the HIM department, the notes are hereby amended to reflect that the patient reports no drug use or alcohol use.  Frequent falls  As per PT evaluation 12/17, home health PT and bariatric bedside commode.  Constipation   Patient now having BMs after bowel regimen, continue at discharge.  Body mass index is 48.5 kg/m./Morbid obesity/OSA  Diet/lifestyle modification recommended.  Nutritional Status Nutrition Problem: Increased nutrient needs Etiology: acute illness Signs/Symptoms: estimated needs Interventions: Premier Protein, MVI   Hyperkalemia  Developed postoperatively on 12/15.  Skin rash  Felt to be related to sulfa antibiotics.  Topical steroids recommended.  Resolved.  Microscopic hematuria  Unclear etiology.  Recommend repeating urine microscopy in 3 to 4 weeks as outpatient and if persists then needs further evaluation.   Consultants:   Infectious disease Interventional radiology Cardiology Orthopedic surgery  Procedures:    TEE 12/7  IR guided drain placement to L thigh 12/8  Incision and Drainage of Gluteal abscess 06/23/19 & 12/16    Discharge Instructions  Discharge Instructions    Amb Referral to Nutrition and Diabetic E   Complete by: As directed    Call MD for:  difficulty breathing, headache or visual disturbances   Complete by: As directed    Call MD for:  extreme fatigue   Complete by: As directed    Call MD for:  persistant dizziness or light-headedness    Complete by: As directed    Call MD for:  persistant nausea and vomiting   Complete by: As directed    Call MD for:  redness, tenderness, or signs of infection (pain, swelling, redness, odor or green/yellow discharge around incision site)   Complete by: As directed    Call MD for:  severe uncontrolled pain   Complete by: As directed    Call MD for:  temperature >100.4   Complete by: As directed    Diet - low sodium heart healthy   Complete by: As directed    Diet Carb Modified   Complete by: As directed    Home infusion instructions [place] May follow [place] Dosing Protocol; May administer Cathflo as needed to maintain patency of vascular access device.; [place] of vascular access device: per [facility] Protocol: 0.9% NaCl pre/post medica...   Complete by: As directed    Instructions: May follow [place] Dosing Protocol   Instructions: May administer Cathflo as needed to maintain patency of vascular access device.   Instructions: [place] of vascular access device: per [facility] Protocol: 0.9% NaCl pre/post medication administration and  prn patency; Heparin 100 u/ml, 71m for implanted ports and Heparin 10u/ml, 578mfor all other central venous catheters.   Instructions: May follow [facility] Anaphylaxis Protocol for First Dose Administration in the home: 0.9% NaCl at 25-50 ml/hr to maintain IV access for protocol meds. Epinephrine 0.3 ml IV/IM PRN and Benadryl 25-50 IV/IM PRN s/s of anaphylaxis.   Instructions: [facility] Coordinator (RN) to assist per patient IV care needs in the home PRN.   Increase activity slowly   Complete by: As directed        Medication List    TAKE these medications   acetaminophen 325 MG tablet Commonly known as: TYLENOL Take 2 tablets (650 mg total) by mouth every 8 (eight) hours as needed for mild pain or fever.   blood glucose meter kit and supplies Kit Dispense based on patient and insurance preference. Use up to four times daily as  directed. (FOR ICD-9 250.00, 250.01).   ceFAZolin  IVPB Commonly known as: ANCEF Inject 2 g into the vein every 8 (eight) hours. Indication:  MSSA bacteremia/gluteal abscess Last Day of Therapy:  07/29/2019 Labs - Once weekly:  CBC/D and BMP, Labs - Every other week:  ESR and CRP   cholecalciferol 25 MCG (1000 UT) tablet Commonly known as: VITAMIN D3 Take 2,000 Units by mouth daily.   docusate sodium 100 MG capsule Commonly known as: COLACE Take 1 capsule (100 mg total) by mouth 2 (two) times daily.   Ensure Max Protein Liqd Take 330 mLs (11 oz total) by mouth daily. Start taking on: July 01, 2019   insulin aspart 100 UNIT/ML injection Commonly known as: novoLOG Inject 8 Units into the skin 3 (three) times daily with meals. What changed: how much to take   insulin glargine 100 UNIT/ML injection Commonly known as: LANTUS Inject 0.24 mLs (24 Units total) into the skin daily. Start taking on: July 01, 2019   Insulin Syringes (Disposable) U-100 0.5 ML Misc Use as directed 3 times daily AC.   multivitamin with minerals Tabs tablet Take 1 tablet by mouth daily. Start taking on: July 01, 2019   oxyCODONE-acetaminophen 5-325 MG tablet Commonly known as: PERCOCET/ROXICET Take 1 tablet by mouth every 8 (eight) hours as needed for moderate pain or severe pain.   polyethylene glycol 17 g packet Commonly known as: MIRALAX / GLYCOLAX Take 17 g by mouth daily as needed for mild constipation or moderate constipation.   vitamin C 500 MG tablet Commonly known as: ASCORBIC ACID Take 1,000 mg by mouth daily.      Allergies  Allergen Reactions  . Sulfa Antibiotics Rash    Total body rash      Procedures/Studies: CT Angio Chest PE W and/or Wo Contrast  Result Date: 06/10/2019 CLINICAL DATA:  Chest pain. EXAM: CT ANGIOGRAPHY CHEST WITH CONTRAST TECHNIQUE: Multidetector CT imaging of the chest was performed using the standard protocol during bolus administration of  intravenous contrast. Multiplanar CT image reconstructions and MIPs were obtained to evaluate the vascular anatomy. CONTRAST:  8032mMNIPAQUE IOHEXOL 350 MG/ML SOLN COMPARISON:  None. FINDINGS: Cardiovascular: There does not appear to be any definite evidence of large central pulmonary embolus in the main pulmonary artery or main portions of the left or right pulmonary arteries. However, there is limited opacification of the more peripheral branches, and therefore smaller peripheral pulmonary emboli cannot be excluded on the basis of this exam. There is no evidence of thoracic aortic dissection or aneurysm. Normal cardiac size. No pericardial  effusion. Mediastinum/Nodes: No enlarged mediastinal, hilar, or axillary lymph nodes. Thyroid gland, trachea, and esophagus demonstrate no significant findings. Lungs/Pleura: Lungs are clear. No pleural effusion or pneumothorax. Upper Abdomen: Mild splenomegaly is noted. Possible nodularity of liver is noted suggesting possible hepatic cirrhosis. Musculoskeletal: No chest wall abnormality. No acute or significant osseous findings. Review of the MIP images confirms the above findings. IMPRESSION: 1. There is no definite evidence of large central pulmonary embolus in the main pulmonary artery or main portions of the left or right pulmonary arteries. However, there is limited opacification of the more peripheral branches, and therefore smaller peripheral pulmonary emboli cannot be excluded on the basis of this exam. 2. Mild splenomegaly is noted. Possible nodularity of liver is noted suggesting possible hepatic cirrhosis. Electronically Signed   By: Marijo Conception M.D.   On: 06/10/2019 16:19   CT PELVIS W CONTRAST  Result Date: 06/20/2019 CLINICAL DATA:  Abscess, LEFT leg swelling, abscess drainage, diabetes mellitus EXAM: CT PELVIS WITH CONTRAST TECHNIQUE: Multidetector CT imaging of the pelvis was performed using the standard protocol following the bolus administration of  intravenous contrast. Sagittal and coronal MPR images reconstructed from axial data set. CONTRAST:  142m OMNIPAQUE IOHEXOL 300 MG/ML  SOLN IV COMPARISON:  06/10/2019 FINDINGS: Urinary Tract: Normal appearing bladder, distal ureters and visualized inferior poles of the kidneys Bowel: Sigmoid diverticulosis. Prominent stool in rectum. Normal appendix. Vascular/Lymphatic: Vascular structures patent Reproductive:  Normal prostate gland Other: Ascites in pelvis. No free air. Scattered subcutaneous edema at the flanks and proximal LEFT thigh laterally. Musculoskeletal: Osseous structures unremarkable. LEFT buttock/proximal LEFT lower extremity: Percutaneous pigtail drainage catheter LEFT gluteal muscles. Bilobed residual abscess collection identified at the LEFT gluteal muscles. Dominant collection more posteriorly, containing the drainage catheter, 9.5 x 5.7 x 16.6 cm. Thinner collection extending anteriorly and descending laterally at the upper LEFT leg, 7.8 x 1.9 cm in axial dimensions and extending 15.5 cm length, containing tiny foci of gas, appears to extend deep to muscular fascia. Overlying subcutaneous infiltration. Intermuscular edema identified at the posterior muscles of the LEFT thigh without discrete fluid collection. No joint effusion. IMPRESSION: Persistent large LEFT gluteal abscess collection 9.5 x 5.7 cm, 16.6 cm length, containing the pigtail drainage catheter. Additional thinner contiguous abscess collection extending more anteriorly and descending laterally into the lateral LEFT leg 7.8 x 1.9 in axial dimensions, 15.5 cm length, extends deep to muscular fascia. Additional intermuscular fluid at the LEFT hamstring muscles. Scattered subcutaneous edema at the flanks and LEFT hip region. Ascites. Electronically Signed   By: MLavonia DanaM.D.   On: 06/20/2019 17:25   CT HIP LEFT WO CONTRAST  Result Date: 06/10/2019 CLINICAL DATA:  Hip swelling and infection EXAM: CT OF THE LEFT HIP WITHOUT CONTRAST  TECHNIQUE: Multidetector CT imaging of the left hip was performed according to the standard protocol. Multiplanar CT image reconstructions were also generated. COMPARISON:  May 14, 2019 MRI FINDINGS: Bones/Joint/Cartilage No fracture or dislocation. There is moderate bilateral hip osteoarthritis with superior joint space loss and marginal osteophyte formation. No large joint effusion is seen. Ligaments Suboptimally assessed by CT. Muscles and Tendons Within the gluteal musculature there is enlargement and heterogeneous appearance to the gluteal musculature. No definite loculated fluid collection is seen, however limited due to technique. Soft tissues Overlying subcutaneous edema and soft tissue stranding which extends over the greater trochanter. IMPRESSION: Heterogeneous appearance with enlargement of the gluteal musculature with surrounding soft tissue inflammatory changes and skin thickening. No definite loculated fluid collection.  This could be due to resolving hematoma or myositis. If further evaluation is required, would recommend MRI. Electronically Signed   By: Prudencio Pair M.D.   On: 06/10/2019 21:13   MR HIP LEFT WO CONTRAST  Result Date: 06/13/2019 CLINICAL DATA:  Hip pain and swelling, question of osteomyelitis EXAM: MR OF THE LEFT HIP WITHOUT CONTRAST TECHNIQUE: Multiplanar, multisequence MR imaging was performed. No intravenous contrast was administered. COMPARISON:  CT 06/10/2019 FINDINGS: Bones: There is no evidence of acute fracture, dislocation or avascular necrosis. No areas of cortical destruction or periosteal reaction is seen. The visualized bony pelvis appears normal. The visualized sacroiliac joints and symphysis pubis appear normal. Articular cartilage and labrum Articular cartilage: No focal chondral defect or subchondral signal abnormality identified. Labrum: There is no gross labral tear or paralabral abnormality. Joint or bursal effusion Joint effusion: No significant hip joint  effusion. Bursae: No focal periarticular fluid collection. Muscles and tendons Muscles and tendons: There is a large multilocular cystic mass seen within the gluteal musculature measuring approximately 22 cm in craniocaudal dimension. The loculated fluid collection extends in anteriorly into the trochanteric bursa. There is scattered debris seen within the collection. Increased feathery signal seen throughout the remainder of the muscles surrounding the hip. This is most notable within the abductor, piriformis, and psoas musculature. A however appear to be intact. There is limited visualization of the gluteal tendons, however they do appear to be intact. The hamstrings and iliopsoas tendons are intact. Other findings Miscellaneous: A moderate amount of ascites is present within the deep pelvis. IMPRESSION: 1. Large multilocular septated cystic mass within the gluteal musculature extending into the trochanteric bursa which could chronic organizing hematoma/seroma or intramuscular abscess. No evidence of osteomyelitis. 2. Diffuse muscular edema surrounding the hip, most notable within the psoas and adductor musculature 3. Moderate pelvic ascites. Electronically Signed   By: Prudencio Pair M.D.   On: 06/13/2019 20:52   IR US Guide Bx Asp/Drain  Result Date: 06/17/2019 INDICATION: Indeterminate left gluteal fluid collection worrisome for abscess. Please perform ultrasound-guided aspiration and/or drainage catheter placement for diagnostic and therapeutic purposes. EXAM: ULTRASOUND-GUIDED LEFT GLUTEAL ABSCESS DRAINAGE CATHETER PLACEMENT COMPARISON:  Left hip MRI-06/13/2019; left hip CT-06/10/2019 MEDICATIONS: The patient is currently admitted to the hospital and receiving intravenous antibiotics. The antibiotics were administered within an appropriate time frame prior to the initiation of the procedure. ANESTHESIA/SEDATION: Moderate (conscious) sedation was employed during this procedure. A total of Versed 2.5 mg and  Fentanyl 125 mcg was administered intravenously. Moderate Sedation Time: 22 minutes. The patient's level of consciousness and vital signs were monitored continuously by radiology nursing throughout the procedure under my direct supervision. CONTRAST:  None COMPLICATIONS: None immediate. PROCEDURE: Informed written consent was obtained from the patient after a discussion of the risks, benefits and alternatives to treatment. The patient was placed slightly RPO on his hospital bed (patient was unable to tolerate positioning on the CT table or prone positioning) with preprocedural imaging demonstrating a complex at least 7.7 x 7.3 cm fluid collection within the deep tissues of the right buttocks (image 2). The procedure was planned. A timeout was performed prior to the initiation of the procedure. The posterolateral aspect of the left buttocks was prepped and draped in the usual sterile fashion. The overlying soft tissues were anesthetized with 1% lidocaine with epinephrine. Under direct ultrasound guidance, the fluid collection was accessed with an 18 gauge trocar needle and a short Amplatz wire was coiled within the collection. Multiple ultrasound images were saved  for procedural documentation purposes Next, the track was dilated allowing placement of a 10 [place] all-purpose drainage catheter. Following drainage catheter placement, approximately 400 cc of pink colored purulent appearing fluid was aspirated. A sample of aspirated fluid was capped and sent to the laboratory for analysis. The drainage catheter was connected to a JP bulb and secured in place within interrupted suture and a Stat Lock device. A dressing was applied. The patient tolerated the procedure well without immediate postprocedural complication. IMPRESSION: Successful ultrasound guided placement of a 10 [place] all purpose drain catheter into the left gluteal abscess with aspiration of 400 cc of pink colored, purulent appearing fluid. A representative  aspirated sample was capped and sent to the laboratory for analysis. Electronically Signed   By: Sandi Mariscal M.D.   On: 06/17/2019 11:13   DG CHEST PORT 1 VIEW  Result Date: 06/19/2019 CLINICAL DATA:  PICC (peripherally inserted central catheter) in place EXAM: PORTABLE CHEST 1 VIEW COMPARISON:  None. FINDINGS: PICC line with tip in the distal SVC. Low lung volumes and elevation RIGHT hemidiaphragm. Lungs are clear. No pneumothorax. IMPRESSION: PICC line in good position. Electronically Signed   By: Suzy Bouchard M.D.   On: 06/19/2019 20:08   ECHOCARDIOGRAM COMPLETE  Result Date: 06/12/2019   ECHOCARDIOGRAM REPORT   Patient Name:   SUHAIB GUZZO Date of Exam: 06/12/2019 Medical Rec #:  [id number]   Height:       75.0 in Accession #:    [id number]  Weight:       351.6 lb Date of Birth:  10/25/1965  BSA:          2.79 m Patient Age:    53 years    BP:           132/64 mmHg Patient Gender: M           HR:           106 bpm. Exam Location:  Inpatient Procedure: 2D Echo Indications:    Bacteremia 790.7/R78.81  History:        Patient has no prior history of Echocardiogram examinations.                 Risk Factors:Diabetes. MSSA bacteremia.  Sonographer:    Clayton Lefort RDCS (AE) Referring Phys: [id number] [place]  1. Left ventricular ejection fraction, by visual estimation, is 60 to 65%. The left ventricle has normal function. Moderately increased left ventricular posterior wall thickness. There is severely increased left ventricular hypertrophy of the basal septum.  2. Global right ventricle has normal systolic function.The right ventricular size is normal. No increase in right ventricular wall thickness.  3. Left atrial size was normal.  4. Right atrial size was normal.  5. The mitral valve is normal in structure. No evidence of mitral valve regurgitation. No evidence of mitral stenosis.  6. The tricuspid valve is normal in structure. Tricuspid valve regurgitation is not demonstrated.  7. The  aortic valve is tricuspid. Aortic valve regurgitation is not visualized. Mild aortic valve sclerosis without stenosis.  8. The pulmonic valve was normal in structure. Pulmonic valve regurgitation is not visualized.  9. The inferior vena cava is normal in size with <50% respiratory variability, suggesting right atrial pressure of 8 mmHg. 10. TR signal is inadequate for assessing pulmonary artery systolic pressure. 11. Trivial pericardial effusion is present. 12. The pericardial effusion is posterior to the left ventricle. FINDINGS  Left Ventricle: Left ventricular ejection fraction, by visual estimation, is  60 to 65%. The left ventricle has normal function. Moderately increased left ventricular posterior wall thickness. There is severely increased left ventricular hypertrophy of the basal septum. Left ventricular diastolic parameters were normal. Normal left atrial pressure. Right Ventricle: The right ventricular size is normal. No increase in right ventricular wall thickness. Global RV systolic function is has normal systolic function. Left Atrium: Left atrial size was normal in size. Right Atrium: Right atrial size was normal in size Pericardium: Trivial pericardial effusion is present. The pericardial effusion is posterior to the left ventricle. Mitral Valve: The mitral valve is normal in structure. No evidence of mitral valve stenosis by observation. MV peak gradient, 3.7 mmHg. No evidence of mitral valve regurgitation. Tricuspid Valve: The tricuspid valve is normal in structure. Tricuspid valve regurgitation is not demonstrated. Aortic Valve: The aortic valve is tricuspid. Aortic valve regurgitation is not visualized. Mild aortic valve sclerosis is present, with no evidence of aortic valve stenosis. Aortic valve mean gradient measures 6.0 mmHg. Aortic valve peak gradient measures 10.1 mmHg. Aortic valve area, by VTI measures 2.08 cm. Pulmonic Valve: The pulmonic valve was normal in structure. Pulmonic valve  regurgitation is not visualized. Aorta: The aortic root, ascending aorta and aortic arch are all structurally normal, with no evidence of dilitation or obstruction. Venous: The inferior vena cava is normal in size with less than 50% respiratory variability, suggesting right atrial pressure of 8 mmHg. IAS/Shunts: No atrial level shunt detected by color flow Doppler. There is no evidence of a patent foramen ovale. No ventricular septal defect is seen or detected. There is no evidence of an atrial septal defect.  LEFT VENTRICLE PLAX 2D LVIDd:         3.90 cm  Diastology LVIDs:         2.60 cm  LV e' lateral:   11.00 cm/s LV PW:         1.40 cm  LV E/e' lateral: 6.0 LV IVS:        1.50 cm  LV e' medial:    6.74 cm/s LVOT diam:     2.10 cm  LV E/e' medial:  9.8 LV SV:         41 ml LV SV Index:   13.89 LVOT Area:     3.46 cm  RIGHT VENTRICLE             IVC RV Basal diam:  3.70 cm     IVC diam: 2.10 cm RV Mid diam:    3.00 cm RV S prime:     16.90 cm/s TAPSE (M-mode): 2.7 cm LEFT ATRIUM             Index       RIGHT ATRIUM           Index LA diam:        4.00 cm 1.43 cm/m  RA Area:     13.20 cm LA Vol (A2C):   64.1 ml 22.98 ml/m RA Volume:   27.00 ml  9.68 ml/m LA Vol (A4C):   48.9 ml 17.53 ml/m LA Biplane Vol: 56.1 ml 20.11 ml/m  AORTIC VALVE AV Area (Vmax):    2.14 cm AV Area (Vmean):   1.95 cm AV Area (VTI):     2.08 cm AV Vmax:           159.00 cm/s AV Vmean:          117.000 cm/s AV VTI:  0.300 m AV Peak Grad:      10.1 mmHg AV Mean Grad:      6.0 mmHg LVOT Vmax:         98.30 cm/s LVOT Vmean:        65.800 cm/s LVOT VTI:          0.180 m LVOT/AV VTI ratio: 0.60  AORTA Ao Root diam: 3.20 cm MITRAL VALVE MV Area (PHT): 3.54 cm             SHUNTS MV Peak grad:  3.7 mmHg             Systemic VTI:  0.18 m MV Mean grad:  2.0 mmHg             Systemic Diam: 2.10 cm MV Vmax:       0.96 m/s MV Vmean:      61.9 cm/s MV VTI:        0.25 m MV PHT:        62.06 msec MV Decel Time: 214 msec MV E velocity:  66.00 cm/s 103 cm/s MV A velocity: 64.80 cm/s 70.3 cm/s MV E/A ratio:  1.02       1.5  Fransico Him MD Electronically signed by Fransico Him MD Signature Date/Time: 06/12/2019/9:05:58 AM    Final    ECHO TEE  Result Date: 06/16/2019   TRANSESOPHOGEAL ECHO REPORT   Patient Name:   KAMEREN PARGAS Date of Exam: 06/16/2019 Medical Rec #:  [id number]   Height:       75.0 in Accession #:    [id number]  Weight:       375.2 lb Date of Birth:  1965-07-25  BSA:          2.87 m Patient Age:    8 years    BP:           112/58 mmHg Patient Gender: M           HR:           91 bpm. Exam Location:  Inpatient  Procedure: Transesophageal Echo and Color Doppler Indications:     Bacteremia  History:         Patient has prior history of Echocardiogram examinations, most                  recent 06/12/2019.  Sonographer:     Roseanna Rainbow RDCS Referring Phys:  [id number] Darreld Mclean Diagnosing Phys: [name] MD  PROCEDURE: Normal Transesophogeal exam. Consent was requested emergently by emergency room physicain. Patients was monitored while under deep sedation Anesthestetic sedation was provided intravenously by Anesthesiology: 261m of Propofol. The transesophogeal probe was passed through the esophogus of the patient. Imaged were obtained with the patient in a supine position. The patient's vital signs; including heart rate, blood pressure, and oxygen saturation; remained stable throughout the procedure. The patient developed no complications during the procedure. IMPRESSIONS  1. Left ventricular ejection fraction, by visual estimation, is 60 to 65%. The left ventricle has normal function. Normal left ventricular size. There is no left ventricular hypertrophy.  2. Left ventricular diastolic function could not be evaluated.  3. Global right ventricle has normal systolic function.The right ventricular size is normal. No increase in right ventricular wall thickness.  4. Left atrial size was normal.  5. Right atrial size was normal.   6. The mitral valve is normal in structure. Mild mitral valve regurgitation. No evidence of mitral stenosis.  7. No trisuspid valve  vegetation visualized.  8. The tricuspid valve is normal in structure. Tricuspid valve regurgitation is mild.  9. The aortic valve is normal in structure. Aortic valve regurgitation is not visualized. Mild to moderate aortic valve sclerosis/calcification without any evidence of aortic stenosis. 10. No pulmonic vegetation present. 11. The pulmonic valve was normal in structure. Pulmonic valve regurgitation is not visualized. 12. The inferior vena cava is normal in size with greater than 50% respiratory variability, suggesting right atrial pressure of 3 mmHg. 13. No vegetation was seen. FINDINGS  Left Ventricle: Left ventricular ejection fraction, by visual estimation, is 60 to 65%. The left ventricle has normal function. There is no left ventricular hypertrophy. Normal left ventricular size. Left ventricular diastolic function could not be evaluated. Normal left atrial pressure. Right Ventricle: The right ventricular size is normal. No increase in right ventricular wall thickness. Global RV systolic function is has normal systolic function. Left Atrium: Left atrial size was normal in size. Right Atrium: Right atrial size was normal in size Pericardium: There is no evidence of pericardial effusion. Mitral Valve: The mitral valve is normal in structure. No evidence of mitral valve stenosis by observation. Mild mitral valve regurgitation. There is no evidence of mitral valve vegetation. Tricuspid Valve: The tricuspid valve is normal in structure. Tricuspid valve regurgitation is mild. No TV vegetation was visualized. Aortic Valve: The aortic valve is normal in structure. Aortic valve regurgitation is not visualized. Mild to moderate aortic valve sclerosis/calcification is present, without any evidence of aortic stenosis. Pulmonic Valve: The pulmonic valve was normal in structure. Pulmonic  valve regurgitation is not visualized. No pulmonic vegetation present. Aorta: The aortic root, ascending aorta and aortic arch are all structurally normal, with no evidence of dilitation or obstruction. Venous: The inferior vena cava is normal in size with greater than 50% respiratory variability, suggesting right atrial pressure of 3 mmHg. Shunts: There is no evidence of a patent foramen ovale. No ventricular septal defect is seen or detected. There is no evidence of an atrial septal defect. No atrial level shunt detected by color flow Doppler.  [name] MD Electronically signed by [name] MD Signature Date/Time: 06/16/2019/1:01:31 PM    Final    [place] EKG SITE RITE  Result Date: 06/19/2019 If Site Rite image not attached, placement could not be confirmed due to current cardiac rhythm.     Subjective: Patient having BMs.  Mild left postop hip/buttock pain, controlled on current pain medication regimen.  No other complaints reported.  As per RN, no acute issues noted.  Discharge Exam:  Vitals:   06/29/19 0500 06/29/19 1710 06/29/19 2220 06/30/19 0620  BP: 135/75 137/73 129/71 127/74  Pulse: 79 (!) 101 83 (!) 102  Resp: _0 Temp: 98.2 F (36.8 C) 97.8 F (36.6 C) 98.2 F (36.8 C) 98.3 F (36.8 C)  TempSrc: Oral Oral Oral Oral  SpO2: 99% 96% 100% 97%  Weight:   (!) 176.1 kg   Height:        General exam: Pleasant young male, moderately built and morbidly obese lying comfortably supine in bed without distress. Respiratory system: Clear to auscultation. Respiratory effort normal. Cardiovascular system: S1 & S2 heard, RRR. No JVD, murmurs, rubs, gallops or clicks.  Trace bilateral leg edema. Gastrointestinal system: Abdomen is nondistended, soft and nontender. No organomegaly or masses felt. Normal bowel sounds heard. Central nervous system: Alert and oriented. No focal neurological deficits. Extremities: Symmetric 5 x 5 power. Skin: Left hip/gluteal site dressing  clean and dry. Psychiatry: Judgement and insight appear normal. Mood & affect appropriate.    The results of significant diagnostics from this hospitalization (including imaging, microbiology, ancillary and laboratory) are listed below for reference.     Microbiology: No results found for this or any previous visit (from the past 240 hour(s)).   Labs: CBC: Recent Labs  Lab 06/26/19 0429 06/27/19 0450 06/28/19 0412 06/29/19 1303 06/30/19 0509  WBC 8.7 6.5 6.6 10.8* 9.1  HGB 8.7* 7.4* 7.9* 8.9* 8.9*  HCT 25.8* 22.6* 23.9* 26.7* 26.9*  MCV 93.8 96.2 96.4 95.4 95.7  PLT 128* 111* 119* 125* 124*    Basic Metabolic Panel: Recent Labs  Lab 06/26/19 0429 06/27/19 0450 06/28/19 0412 06/29/19 0350 06/30/19 0509  NA 132* 131* 135 133* 136  K 5.1 4.3 3.9 3.7 3.6  CL 101 101 102 103 103  CO2 _0 20* 25  GLUCOSE 274* 227* 193* 269* 151*  BUN _1 21* 16  CREATININE 0.66 0.57* 0.62 0.60* 0.65  CALCIUM 7.8* 7.7* 7.8* 8.1* 8.1*    Liver Function Tests: Recent Labs  Lab 06/28/19 0412 06/29/19 0350  AST 96* 105*  ALT 28 33  ALKPHOS 220* 260*  BILITOT 0.5 1.1  PROT 5.1* 6.3*  ALBUMIN 1.3* 1.7*    CBG: Recent Labs  Lab 06/29/19 0711 06/29/19 1151 06/29/19 1700 06/29/19 2218 06/30/19 1321  GLUCAP 233* 194* 161* 145* 135*     Urinalysis    Component Value Date/Time   COLORURINE AMBER (A) 06/20/2019 0458   APPEARANCEUR CLEAR 06/20/2019 0458   LABSPEC 1.030 06/20/2019 0458   PHURINE 5.0 06/20/2019 0458   GLUCOSEU NEGATIVE 06/20/2019 0458   HGBUR SMALL (A) 06/20/2019 0458   BILIRUBINUR NEGATIVE 06/20/2019 0458   [place] 06/20/2019 0458   PROTEINUR 30 (A) 06/20/2019 0458   NITRITE NEGATIVE 06/20/2019 0458   LEUKOCYTESUR NEGATIVE 06/20/2019 0458      Time coordinating discharge: 60 minutes  SIGNED:  Vernell Leep, MD, FACP, [facility]. [facility]  To contact the attending provider between 7A-7P or the covering provider during  after hours 7P-7A, please log into the web site [email or web address] and access using universal [place] password for that web site. If you do not have the password, please call the hospital operator.

## 2019-06-30 NOTE — Progress Notes (Signed)
Nutrition Follow-up  DOCUMENTATION CODES:   Morbid obesity  INTERVENTION:  Continue Ensure Max Protein po once daily, each supplement provides 150 kcal and 30 grams of protein. Patient prefers chocolate. Would increase oral nutrition supplementation in setting of poor PO intake but patient now with discharge orders. Encourage him to drink a high-protein oral nutrition supplement 2-3 times daily at home until appetite/PO intake improves.  Continue daily MVI.  Continue vitamin C 1000 mg daily. Consider splitting into 500 mg BID for better absorption.  NUTRITION DIAGNOSIS:   Increased nutrient needs related to acute illness as evidenced by estimated needs.  Ongoing.  GOAL:   Patient will meet greater than or equal to 90% of their needs  Not met.  MONITOR:   PO intake, Supplement acceptance, Weight trends, Labs, I & O's, Skin  REASON FOR ASSESSMENT:   Malnutrition Screening Tool    ASSESSMENT:   Patient with PMH significant for DM. Recently admitted to [facility] from 11/2-11/7 with E.Coli UTI. Presents this admission with sepsis from possible L gluteal cellulitis.   -Patient s/p I&D of left gluteal abscess on 12/14 and 12/16  Plan is for patient to discharge home today with home health. Spoke with patient over the phone. He reports his appetite has been decreased and he has not been eating well. According to chart he ate 0% of all three meals yesterday. He reports he ate "light meals" today but was not able to provide any details. He reports he is drinking the Ensure Max occasionally. He did not have any yesterday. He reports he feels he will be able to eat better at home. Discussed importance of adequate calorie/protein intake and encouraged patient to eat high-protein meals and continue drinking a high-protein oral nutrition supplement at home.  Medications reviewed and include: vitamin D3 2000 units daily, Colace 100 mg BID, Novolog 0-15 units TID, Novolog 0-5 units QHS, Novolog 8  units TID, Lantus 24 units daily, MVI daily, Miralax 17 grams BID, senna 2 tablets QHS, vitamin C 1000 mg daily, cefazolin.  Labs reviewed: CBG 145-135.  Diet Order:   Diet Order            Diet - low sodium heart healthy        Diet Carb Modified        Diet Carb Modified Fluid consistency: Thin; Room service appropriate? Yes  Diet effective now             EDUCATION NEEDS:   Education needs have been addressed  Skin:  Skin Assessment: Skin Integrity Issues:(MSAD to sacrum; closed incision left hip) Skin Integrity Issues:: Other (Comment) Other: MASD- sacrum  Last BM:  06/29/2019 - small type 4  Height:   Ht Readings from Last 1 Encounters:  06/25/19 6' 3"  (1.905 m)   Weight:   Wt Readings from Last 1 Encounters:  06/29/19 (!) 176.1 kg   Ideal Body Weight:  89.1 kg  BMI:  Body mass index is 48.53 kg/m.  Estimated Nutritional Needs:   Kcal:  2400-2600 kcal  Protein:  120-135 grams  Fluid:  >/= 2 L/day  Jacklynn Barnacle, MS, RD, LDN Office: [phone] Pager: [phone] After Hours/Weekend Pager: [phone]

## 2019-06-30 NOTE — TOC Transition Note (Signed)
Transition of Care [facility]) - CM/SW Discharge Note   Patient Details  Name: Coy Rochford MRN: [id number] Date of Birth: 1966/01/31  Transition [facility] CM/SW Contact:  Bartholomew Crews, RN Phone Number: [phone] 06/30/2019, 10:29 AM   Clinical Narrative:    Follow up to discharge planning.   Spoke with with Clint. Patient is scheduled for telehealth appointment on 07/17/19 2:30pm - patient will get a reminder call the day before.   Reached out to [place]. Override created by previous NCM for discharge medications. Patient had received match in November 2020.   Spoke with liaison at [facility]. Patient has been advised and educated. He has an out of pocket cost of about $45/day for IV antibiotics/supplies. Patient is to receive 2pm dose of ancef in the hospital, and will do 10 pm dose at home as already taught. Liaison with [facility] to see patient today prior to discharge to review discharge needs and self administration of IV antibiotics.    Patient has been given contact information for [facility] for infusion nursing - patient is aware of need for follow up to establish out of pocket cost.   Patient to transition home today. TOC team following for transition needs.    Final next level of care: [place] Barriers to Discharge: No Barriers Identified   Patient Goals and CMS Choice Patient states their goals for this hospitalization and ongoing recovery are:: to go home CMS Medicare.gov Compare Post Acute Care list provided to:: Patient Choice offered to / list presented to : Patient  Discharge Placement                       Discharge Plan and Services   Discharge Planning Services: CM Consult Post Acute Care Choice: Home Health          DME Arranged: 3-N-1 DME Agency: [facility] Date DME Agency Contacted: 06/20/19 Time DME Agency Contacted: [phone] Representative spoke with at DME Agency:  [place]: IV Antibiotics HH Agency: [facility] Date [place]: 06/30/19 Time [place]: 1028 Representative spoke with at [place]: [place] Determinants of Health ([place]) Interventions     Readmission Risk Interventions No flowsheet data found.

## 2019-06-30 NOTE — Plan of Care (Signed)
  Problem: Health Behavior/Discharge Planning: Goal: Ability to manage health-related needs will improve Outcome: Progressing   

## 2019-06-30 NOTE — Progress Notes (Signed)
DISCHARGE NOTE Robert Sullivan to be discharged Home per MD order. Patient verbalized understanding.  Skin clean, dry and intact without evidence of skin break down, no evidence of skin tears noted.Going home with PICC line in RUA. Site without signs and symptoms of complications.Intact. no complaints noted.  Patient free of lines, drains, and wounds.   Discharge packet assembled. An After Visit Summary (AVS) was printed and given to the patient/wife . Patient escorted via wheelchair  and discharged to home via private auto.   Babs Sciara, RN

## 2019-06-30 NOTE — TOC Transition Note (Signed)
Transition of Care [facility]) - CM/SW Discharge Note   Patient Details  Name: Robert Sullivan MRN: [id number] Date of Birth: 02/04/1966  [facility]) CM/SW Contact:  Bartholomew Crews, RN Phone Number: [phone] 06/30/2019, 2:17 PM   Clinical Narrative:    Referral place for charity [facility] to [facility]. Only able to accept patient for PT d/t high demand for skilled nursing at this time.    Final next level of care: [place] Barriers to Discharge: No Barriers Identified   Patient Goals and CMS Choice Patient states their goals for this hospitalization and ongoing recovery are:: to go home CMS Medicare.gov Compare Post Acute Care list provided to:: Patient Choice offered to / list presented to : Patient  Discharge Placement                       Discharge Plan and Services   Discharge Planning Services: CM Consult Post Acute Care Choice: Home Health          DME Arranged: 3-N-1 DME Agency: [facility] Date DME Agency Contacted: 06/20/19 Time DME Agency Contacted: [phone] Representative spoke with at DME Agency: [place]: IV Antibiotics HH Agency: [facility] Date [place]: 06/30/19 Time [place]: 1028 Representative spoke with at [place]: [place] Determinants of Health ([place]) Interventions     Readmission Risk Interventions No flowsheet data found.

## 2019-07-02 ENCOUNTER — Telehealth: Payer: Self-pay

## 2019-07-02 NOTE — Telephone Encounter (Signed)
Called patient to set up Hospital Follow up currently on IV antibiotics that need office visits/video visits scheduled with either Marya Amsler or [place]. Mail box is full , will call again later.

## 2019-07-08 ENCOUNTER — Other Ambulatory Visit (HOSPITAL_COMMUNITY)
Admission: RE | Admit: 2019-07-08 | Discharge: 2019-07-08 | Disposition: A | Discharge status: Home / Self Care | Payer: Self-pay | Source: Other Acute Inpatient Hospital | Attending: Family Medicine | Admitting: Family Medicine

## 2019-07-08 DIAGNOSIS — A419 Sepsis, unspecified organism: Secondary | ICD-10-CM | POA: Insufficient documentation

## 2019-07-08 DIAGNOSIS — R7881 Bacteremia: Secondary | ICD-10-CM | POA: Insufficient documentation

## 2019-07-08 LAB — CBC WITH DIFFERENTIAL/PLATELET
Abs Immature Granulocytes: 0.06 10*3/uL (ref 0.00–0.07)
Basophils Absolute: 0.1 10*3/uL (ref 0.0–0.1)
Basophils Relative: 1 %
Eosinophils Absolute: 0.2 10*3/uL (ref 0.0–0.5)
Eosinophils Relative: 3 %
HCT: 34.7 % — ABNORMAL LOW (ref 39.0–52.0)
Hemoglobin: 10.7 g/dL — ABNORMAL LOW (ref 13.0–17.0)
Immature Granulocytes: 1 %
Lymphocytes Relative: 19 %
Lymphs Abs: 1.5 10*3/uL (ref 0.7–4.0)
MCH: 31.1 pg (ref 26.0–34.0)
MCHC: 30.8 g/dL (ref 30.0–36.0)
MCV: 100.9 fL — ABNORMAL HIGH (ref 80.0–100.0)
Monocytes Absolute: 0.5 10*3/uL (ref 0.1–1.0)
Monocytes Relative: 7 %
Neutro Abs: 5.5 10*3/uL (ref 1.7–7.7)
Neutrophils Relative %: 69 %
Platelets: 142 10*3/uL — ABNORMAL LOW (ref 150–400)
RBC: 3.44 MIL/uL — ABNORMAL LOW (ref 4.22–5.81)
RDW: 17.6 % — ABNORMAL HIGH (ref 11.5–15.5)
WBC: 7.9 10*3/uL (ref 4.0–10.5)
nRBC: 0 % (ref 0.0–0.2)

## 2019-07-08 LAB — C-REACTIVE PROTEIN: CRP: 2.6 mg/dL — ABNORMAL HIGH (ref <1.0)

## 2019-07-08 LAB — BASIC METABOLIC PANEL
Anion gap: 10 (ref 5–15)
BUN: 13 mg/dL (ref 6–20)
CO2: 21 mmol/L — ABNORMAL LOW (ref 22–32)
Calcium: 8 mg/dL — ABNORMAL LOW (ref 8.9–10.3)
Chloride: 107 mmol/L (ref 98–111)
Creatinine, Ser: 0.52 mg/dL — ABNORMAL LOW (ref 0.61–1.24)
GFR calc Af Amer: >60 mL/min (ref >60)
GFR calc non Af Amer: >60 mL/min (ref >60)
Glucose, Bld: 99 mg/dL (ref 70–99)
Potassium: 4.8 mmol/L (ref 3.5–5.1)
Sodium: 138 mmol/L (ref 135–145)

## 2019-07-09 ENCOUNTER — Inpatient Hospital Stay (HOSPITAL_COMMUNITY)
Admission: RE | Admit: 2019-07-09 | Discharge: 2019-07-25 | DRG: 571 | Disposition: A | Discharge status: Home / Self Care | Payer: Self-pay | Source: Intra-hospital | Attending: Orthopedic Surgery | Admitting: Orthopedic Surgery

## 2019-07-09 ENCOUNTER — Inpatient Hospital Stay (INDEPENDENT_AMBULATORY_CARE_PROVIDER_SITE_OTHER): Payer: Self-pay | Admitting: Primary Care

## 2019-07-09 ENCOUNTER — Encounter (HOSPITAL_COMMUNITY)
Admission: RE | Disposition: A | Discharge status: Home / Self Care | Payer: Self-pay | Source: Intra-hospital | Attending: Orthopedic Surgery

## 2019-07-09 ENCOUNTER — Encounter (HOSPITAL_COMMUNITY): Payer: Self-pay | Admitting: Orthopedic Surgery

## 2019-07-09 ENCOUNTER — Inpatient Hospital Stay (HOSPITAL_COMMUNITY): Payer: Self-pay | Admitting: Anesthesiology

## 2019-07-09 ENCOUNTER — Other Ambulatory Visit: Payer: Self-pay

## 2019-07-09 ENCOUNTER — Other Ambulatory Visit: Payer: Self-pay | Admitting: Physician Assistant

## 2019-07-09 DIAGNOSIS — I9581 Postprocedural hypotension: Secondary | ICD-10-CM | POA: Diagnosis not present

## 2019-07-09 DIAGNOSIS — R7881 Bacteremia: Secondary | ICD-10-CM

## 2019-07-09 DIAGNOSIS — Z6841 Body Mass Index (BMI) 40.0 and over, adult: Secondary | ICD-10-CM

## 2019-07-09 DIAGNOSIS — Z882 Allergy status to sulfonamides status: Secondary | ICD-10-CM

## 2019-07-09 DIAGNOSIS — Z8619 Personal history of other infectious and parasitic diseases: Secondary | ICD-10-CM

## 2019-07-09 DIAGNOSIS — I10 Essential (primary) hypertension: Secondary | ICD-10-CM | POA: Diagnosis present

## 2019-07-09 DIAGNOSIS — L02416 Cutaneous abscess of left lower limb: Principal | ICD-10-CM

## 2019-07-09 DIAGNOSIS — Z20822 Contact with and (suspected) exposure to covid-19: Secondary | ICD-10-CM | POA: Diagnosis present

## 2019-07-09 DIAGNOSIS — L089 Local infection of the skin and subcutaneous tissue, unspecified: Secondary | ICD-10-CM | POA: Diagnosis present

## 2019-07-09 DIAGNOSIS — D62 Acute posthemorrhagic anemia: Secondary | ICD-10-CM | POA: Diagnosis not present

## 2019-07-09 DIAGNOSIS — Z794 Long term (current) use of insulin: Secondary | ICD-10-CM

## 2019-07-09 DIAGNOSIS — Z79899 Other long term (current) drug therapy: Secondary | ICD-10-CM

## 2019-07-09 DIAGNOSIS — L03317 Cellulitis of buttock: Secondary | ICD-10-CM

## 2019-07-09 DIAGNOSIS — S70229A Blister (nonthermal), unspecified hip, initial encounter: Secondary | ICD-10-CM | POA: Diagnosis present

## 2019-07-09 DIAGNOSIS — E1140 Type 2 diabetes mellitus with diabetic neuropathy, unspecified: Secondary | ICD-10-CM | POA: Diagnosis present

## 2019-07-09 DIAGNOSIS — Z9181 History of falling: Secondary | ICD-10-CM

## 2019-07-09 DIAGNOSIS — G4733 Obstructive sleep apnea (adult) (pediatric): Secondary | ICD-10-CM | POA: Diagnosis present

## 2019-07-09 HISTORY — PX: INCISION AND DRAINAGE ABSCESS: SHX5864

## 2019-07-09 LAB — ABO/RH: ABO/RH(D): A POS

## 2019-07-09 LAB — PREPARE RBC (CROSSMATCH)

## 2019-07-09 LAB — POCT I-STAT EG7
Acid-Base Excess: 2 mmol/L (ref 0.0–2.0)
Bicarbonate: 27.6 mmol/L (ref 20.0–28.0)
Calcium, Ion: 1.21 mmol/L (ref 1.15–1.40)
HCT: 28 % — ABNORMAL LOW (ref 39.0–52.0)
Hemoglobin: 9.5 g/dL — ABNORMAL LOW (ref 13.0–17.0)
O2 Saturation: 93 %
Potassium: 4.4 mmol/L (ref 3.5–5.1)
Sodium: 140 mmol/L (ref 135–145)
TCO2: 29 mmol/L (ref 22–32)
pCO2, Ven: 47.6 mmHg (ref 44.0–60.0)
pH, Ven: 7.372 (ref 7.250–7.430)
pO2, Ven: 71 mmHg — ABNORMAL HIGH (ref 32.0–45.0)

## 2019-07-09 LAB — RESPIRATORY PANEL BY RT PCR (FLU A&B, COVID)
Influenza A by PCR: NEGATIVE
Influenza B by PCR: NEGATIVE
SARS Coronavirus 2 by RT PCR: NEGATIVE

## 2019-07-09 LAB — GLUCOSE, CAPILLARY
Glucose-Capillary: 147 mg/dL — ABNORMAL HIGH (ref 70–99)
Glucose-Capillary: 112 mg/dL — ABNORMAL HIGH (ref 70–99)
Glucose-Capillary: 117 mg/dL — ABNORMAL HIGH (ref 70–99)

## 2019-07-09 LAB — MRSA PCR SCREENING: MRSA by PCR: NEGATIVE

## 2019-07-09 SURGERY — INCISION AND DRAINAGE, ABSCESS
Anesthesia: General | Site: Hip | Laterality: Left

## 2019-07-09 MED ORDER — SUGAMMADEX SODIUM 500 MG/5ML IV SOLN
INTRAVENOUS | Status: AC
  Filled 2019-07-09: qty 5

## 2019-07-09 MED ORDER — MIDAZOLAM HCL 2 MG/2ML IJ SOLN: 0.5000 mg | Freq: Once | INTRAMUSCULAR | Status: DC | PRN

## 2019-07-09 MED ORDER — ONDANSETRON HCL 4 MG PO TABS: 4.0000 mg | ORAL_TABLET | Freq: Four times a day (QID) | ORAL | Status: DC | PRN

## 2019-07-09 MED ORDER — MEPERIDINE HCL 25 MG/ML IJ SOLN: 6.2500 mg | INTRAMUSCULAR | Status: DC | PRN

## 2019-07-09 MED ORDER — CHLORHEXIDINE GLUCONATE 4 % EX LIQD: 60.0000 mL | Freq: Once | CUTANEOUS | Status: DC

## 2019-07-09 MED ORDER — ONDANSETRON HCL 4 MG/2ML IJ SOLN: 4.0000 mg | Freq: Four times a day (QID) | INTRAMUSCULAR | Status: DC | PRN

## 2019-07-09 MED ORDER — SODIUM CHLORIDE 0.9 % IV SOLN: INTRAVENOUS | Status: DC

## 2019-07-09 MED ORDER — ROCURONIUM BROMIDE 10 MG/ML (PF) SYRINGE
PREFILLED_SYRINGE | INTRAVENOUS | Status: DC | PRN
  Administered 2019-07-09: 30 mg via INTRAVENOUS
  Administered 2019-07-09: 50 mg via INTRAVENOUS

## 2019-07-09 MED ORDER — PHENYLEPHRINE 40 MCG/ML (10ML) SYRINGE FOR IV PUSH (FOR BLOOD PRESSURE SUPPORT)
PREFILLED_SYRINGE | INTRAVENOUS | Status: DC | PRN
  Administered 2019-07-09 (×2): 200 ug via INTRAVENOUS
  Administered 2019-07-09 (×2): 120 ug via INTRAVENOUS
  Administered 2019-07-09: 200 ug via INTRAVENOUS
  Administered 2019-07-09 (×2): 80 ug via INTRAVENOUS

## 2019-07-09 MED ORDER — SUGAMMADEX SODIUM 200 MG/2ML IV SOLN
INTRAVENOUS | Status: DC | PRN
  Administered 2019-07-09: 320 mg via INTRAVENOUS

## 2019-07-09 MED ORDER — LACTATED RINGERS IV SOLN: INTRAVENOUS | Status: DC

## 2019-07-09 MED ORDER — INSULIN ASPART 100 UNIT/ML ~~LOC~~ SOLN: 0.0000 [IU] | Freq: Every day | SUBCUTANEOUS | Status: DC

## 2019-07-09 MED ORDER — INSULIN ASPART 100 UNIT/ML ~~LOC~~ SOLN: 4.0000 [IU] | Freq: Three times a day (TID) | SUBCUTANEOUS | Status: DC

## 2019-07-09 MED ORDER — CHLORHEXIDINE GLUCONATE CLOTH 2 % EX PADS
6.0000 | MEDICATED_PAD | Freq: Every day | CUTANEOUS | Status: DC
  Administered 2019-07-09 – 2019-07-25 (×16): 6 via TOPICAL

## 2019-07-09 MED ORDER — PHENYLEPHRINE 40 MCG/ML (10ML) SYRINGE FOR IV PUSH (FOR BLOOD PRESSURE SUPPORT)
PREFILLED_SYRINGE | INTRAVENOUS | Status: AC
  Filled 2019-07-09: qty 10

## 2019-07-09 MED ORDER — LIDOCAINE 2% (20 MG/ML) 5 ML SYRINGE
INTRAMUSCULAR | Status: DC | PRN
  Administered 2019-07-09: 30 mg via INTRAVENOUS

## 2019-07-09 MED ORDER — MIDAZOLAM HCL 2 MG/2ML IJ SOLN
INTRAMUSCULAR | Status: DC | PRN
  Administered 2019-07-09: 2 mg via INTRAVENOUS

## 2019-07-09 MED ORDER — PHENYLEPHRINE 40 MCG/ML (10ML) SYRINGE FOR IV PUSH (FOR BLOOD PRESSURE SUPPORT)
PREFILLED_SYRINGE | INTRAVENOUS | Status: AC
  Filled 2019-07-09: qty 20

## 2019-07-09 MED ORDER — 0.9 % SODIUM CHLORIDE (POUR BTL) OPTIME
TOPICAL | Status: DC | PRN
  Administered 2019-07-09: 1000 mL

## 2019-07-09 MED ORDER — INSULIN ASPART 100 UNIT/ML ~~LOC~~ SOLN: 6.0000 [IU] | Freq: Three times a day (TID) | SUBCUTANEOUS | Status: DC

## 2019-07-09 MED ORDER — ONDANSETRON HCL 4 MG/2ML IJ SOLN
INTRAMUSCULAR | Status: AC
  Filled 2019-07-09: qty 2

## 2019-07-09 MED ORDER — HYDROMORPHONE HCL 1 MG/ML IJ SOLN: 0.2500 mg | INTRAMUSCULAR | Status: DC | PRN

## 2019-07-09 MED ORDER — PROPOFOL 10 MG/ML IV BOLUS
INTRAVENOUS | Status: AC
  Filled 2019-07-09: qty 20

## 2019-07-09 MED ORDER — DEXTROSE 5 % IV SOLN
3.0000 g | INTRAVENOUS | Status: AC
  Administered 2019-07-09: 3 g via INTRAVENOUS
  Filled 2019-07-09: qty 3

## 2019-07-09 MED ORDER — ENSURE MAX PROTEIN PO LIQD
11.0000 [oz_av] | Freq: Every day | ORAL | Status: DC
  Administered 2019-07-10 – 2019-07-25 (×9): 11 [oz_av] via ORAL
  Filled 2019-07-09 (×21): qty 330

## 2019-07-09 MED ORDER — PHENYLEPHRINE HCL-NACL 10-0.9 MG/250ML-% IV SOLN
0.0000 ug/min | INTRAVENOUS | Status: DC
  Administered 2019-07-09: 100 ug/min via INTRAVENOUS
  Filled 2019-07-09 (×3): qty 250

## 2019-07-09 MED ORDER — INSULIN ASPART 100 UNIT/ML ~~LOC~~ SOLN
0.0000 [IU] | Freq: Three times a day (TID) | SUBCUTANEOUS | Status: DC
  Administered 2019-07-10 (×3): 3 [IU] via SUBCUTANEOUS
  Administered 2019-07-11 – 2019-07-12 (×5): 2 [IU] via SUBCUTANEOUS
  Administered 2019-07-13 – 2019-07-14 (×6): 3 [IU] via SUBCUTANEOUS
  Administered 2019-07-15: 2 [IU] via SUBCUTANEOUS
  Administered 2019-07-15: 3 [IU] via SUBCUTANEOUS
  Administered 2019-07-15 – 2019-07-18 (×7): 2 [IU] via SUBCUTANEOUS
  Administered 2019-07-19: 5 [IU] via SUBCUTANEOUS
  Administered 2019-07-19: 3 [IU] via SUBCUTANEOUS
  Administered 2019-07-19: 8 [IU] via SUBCUTANEOUS
  Administered 2019-07-20 – 2019-07-21 (×4): 3 [IU] via SUBCUTANEOUS
  Administered 2019-07-21 – 2019-07-24 (×7): 2 [IU] via SUBCUTANEOUS

## 2019-07-09 MED ORDER — MIDAZOLAM HCL 2 MG/2ML IJ SOLN
INTRAMUSCULAR | Status: AC
  Filled 2019-07-09: qty 2

## 2019-07-09 MED ORDER — INSULIN GLARGINE 100 UNIT/ML ~~LOC~~ SOLN
16.0000 [IU] | Freq: Every day | SUBCUTANEOUS | Status: DC
  Administered 2019-07-10 – 2019-07-25 (×13): 16 [IU] via SUBCUTANEOUS
  Filled 2019-07-09 (×17): qty 0.16

## 2019-07-09 MED ORDER — CEFAZOLIN SODIUM-DEXTROSE 1-4 GM/50ML-% IV SOLN
1.0000 g | Freq: Three times a day (TID) | INTRAVENOUS | Status: DC
  Administered 2019-07-09 – 2019-07-10 (×2): 1 g via INTRAVENOUS
  Filled 2019-07-09 (×3): qty 50

## 2019-07-09 MED ORDER — HYDROMORPHONE HCL 1 MG/ML IJ SOLN
0.5000 mg | INTRAMUSCULAR | Status: DC | PRN
  Administered 2019-07-11 – 2019-07-19 (×16): 1 mg via INTRAVENOUS
  Filled 2019-07-09 (×26): qty 1

## 2019-07-09 MED ORDER — SODIUM CHLORIDE 0.9% IV SOLUTION: Freq: Once | INTRAVENOUS | Status: DC

## 2019-07-09 MED ORDER — ALBUMIN HUMAN 5 % IV SOLN: INTRAVENOUS | Status: DC | PRN

## 2019-07-09 MED ORDER — PROMETHAZINE HCL 25 MG/ML IJ SOLN: 6.2500 mg | INTRAMUSCULAR | Status: DC | PRN

## 2019-07-09 MED ORDER — PROPOFOL 10 MG/ML IV BOLUS
INTRAVENOUS | Status: DC | PRN
  Administered 2019-07-09: 200 mg via INTRAVENOUS

## 2019-07-09 MED ORDER — METOCLOPRAMIDE HCL 5 MG/ML IJ SOLN: 5.0000 mg | Freq: Three times a day (TID) | INTRAMUSCULAR | Status: DC | PRN

## 2019-07-09 MED ORDER — FENTANYL CITRATE (PF) 250 MCG/5ML IJ SOLN
INTRAMUSCULAR | Status: AC
  Filled 2019-07-09: qty 5

## 2019-07-09 MED ORDER — ACETAMINOPHEN 325 MG PO TABS
325.0000 mg | ORAL_TABLET | Freq: Four times a day (QID) | ORAL | Status: DC | PRN
  Administered 2019-07-10 – 2019-07-24 (×4): 650 mg via ORAL
  Filled 2019-07-09 (×4): qty 2

## 2019-07-09 MED ORDER — METOCLOPRAMIDE HCL 5 MG PO TABS
5.0000 mg | ORAL_TABLET | Freq: Three times a day (TID) | ORAL | Status: DC | PRN
  Filled 2019-07-09: qty 2

## 2019-07-09 MED ORDER — FENTANYL CITRATE (PF) 100 MCG/2ML IJ SOLN
INTRAMUSCULAR | Status: DC | PRN
  Administered 2019-07-09: 100 ug via INTRAVENOUS

## 2019-07-09 MED ORDER — PHENYLEPHRINE HCL-NACL 10-0.9 MG/250ML-% IV SOLN
INTRAVENOUS | Status: AC
  Filled 2019-07-09: qty 250

## 2019-07-09 MED ORDER — DOCUSATE SODIUM 100 MG PO CAPS: 100.0000 mg | ORAL_CAPSULE | Freq: Two times a day (BID) | ORAL | Status: DC

## 2019-07-09 MED ORDER — DEXMEDETOMIDINE HCL IN NACL 200 MCG/50ML IV SOLN
INTRAVENOUS | Status: AC
  Filled 2019-07-09: qty 50

## 2019-07-09 MED ORDER — DEXMEDETOMIDINE HCL IN NACL 200 MCG/50ML IV SOLN
INTRAVENOUS | Status: DC | PRN
  Administered 2019-07-09 (×2): 20 ug via INTRAVENOUS

## 2019-07-09 MED ORDER — OXYCODONE HCL 5 MG PO TABS
5.0000 mg | ORAL_TABLET | ORAL | Status: DC | PRN
  Administered 2019-07-10: 10 mg via ORAL
  Administered 2019-07-10: 5 mg via ORAL
  Administered 2019-07-11 – 2019-07-24 (×19): 10 mg via ORAL
  Filled 2019-07-09 (×13): qty 2
  Filled 2019-07-09: qty 1
  Filled 2019-07-09 (×16): qty 2

## 2019-07-09 SURGICAL SUPPLY — 33 items
BLADE SURG 21 STRL SS (BLADE) ×3 IMPLANT
BNDG GAUZE ELAST 4 BULKY (GAUZE/BANDAGES/DRESSINGS) ×6 IMPLANT
CANISTER WOUND CARE 500ML ATS (WOUND CARE) ×6 IMPLANT
CONNECTOR Y ATS VAC SYSTEM (MISCELLANEOUS) ×3 IMPLANT
CONT SPEC 4OZ CLIKSEAL STRL BL (MISCELLANEOUS) ×6 IMPLANT
COVER SURGICAL LIGHT HANDLE (MISCELLANEOUS) ×6 IMPLANT
DRAPE INCISE IOBAN 85X60 (DRAPES) ×6 IMPLANT
DRAPE U-SHAPE 47X51 STRL (DRAPES) ×3 IMPLANT
DRESSING VERAFLO CLEANSE CC (GAUZE/BANDAGES/DRESSINGS) ×2 IMPLANT
DRSG ADAPTIC 3X8 NADH LF (GAUZE/BANDAGES/DRESSINGS) ×3 IMPLANT
DRSG VERAFLO CLEANSE CC (GAUZE/BANDAGES/DRESSINGS) ×6
DURAPREP 26ML APPLICATOR (WOUND CARE) ×3 IMPLANT
ELECT REM PT RETURN 9FT ADLT (ELECTROSURGICAL) ×3
ELECTRODE REM PT RTRN 9FT ADLT (ELECTROSURGICAL) ×1 IMPLANT
GAUZE SPONGE 4X4 12PLY STRL (GAUZE/BANDAGES/DRESSINGS) ×3 IMPLANT
GLOVE BIOGEL PI IND STRL 9 (GLOVE) ×1 IMPLANT
GLOVE BIOGEL PI INDICATOR 9 (GLOVE) ×2
GLOVE SURG ORTHO 9.0 STRL STRW (GLOVE) ×3 IMPLANT
GOWN STRL REUS W/ TWL XL LVL3 (GOWN DISPOSABLE) ×2 IMPLANT
GOWN STRL REUS W/TWL XL LVL3 (GOWN DISPOSABLE) ×4
KIT BASIN OR (CUSTOM PROCEDURE TRAY) ×3 IMPLANT
KIT TURNOVER KIT B (KITS) ×3 IMPLANT
MANIFOLD NEPTUNE II (INSTRUMENTS) ×3 IMPLANT
NS IRRIG 1000ML POUR BTL (IV SOLUTION) ×3 IMPLANT
PACK ORTHO EXTREMITY (CUSTOM PROCEDURE TRAY) ×3 IMPLANT
PAD ARMBOARD 7.5X6 YLW CONV (MISCELLANEOUS) ×6 IMPLANT
SPONGE LAP 18X18 X RAY DECT (DISPOSABLE) ×6 IMPLANT
SUT ETHILON 2 0 PSLX (SUTURE) ×12 IMPLANT
SWABSTK COMLB BENZOIN TINCTURE (MISCELLANEOUS) ×12 IMPLANT
TOWEL GREEN STERILE (TOWEL DISPOSABLE) ×3 IMPLANT
TUBE CONNECTING 12'X1/4 (SUCTIONS) ×1
TUBE CONNECTING 12X1/4 (SUCTIONS) ×2 IMPLANT
YANKAUER SUCT BULB TIP NO VENT (SUCTIONS) ×3 IMPLANT

## 2019-07-09 NOTE — H&P (Addendum)
Robert Sullivan is an 53 y.o. male.   Chief Complaint: Chronic abscess left hip. HPI: Patient is a 53 year old gentleman with diabetic insensate neuropathy sleep apnea does not use CPAP does not smoke states that he slipped in his tub but did not fall he states that from the slip he tore the muscles in the left hip.  Patient has had a prolonged conservative and operative course including being hospitalized the patient states the first time for a week and the second time for a month with serial irrigations and debridement with persistent abscess and drainage today.  Past Medical History:  Diagnosis Date  . Arthritis 05/13/2019   knees  . Diabetes mellitus without complication ([place])   . Headache   . Sleep apnea   . Sleep apnea     Past Surgical History:  Procedure Laterality Date  . INCISION AND DRAINAGE ABSCESS Left 06/23/2019   Procedure: INCISION AND DRAINAGE GLUTEAL ABSCESS;  Surgeon: Renette Butters, MD;  Location: [facility];  Service: Orthopedics;  Laterality: Left;  . INCISION AND DRAINAGE HIP Left 06/25/2019   Procedure: IRRIGATION AND DEBRIDEMENT HIP;  Surgeon: Shona Needles, MD;  Location: [facility];  Service: Orthopedics;  Laterality: Left;  . IR US GUIDE BX ASP/DRAIN  06/17/2019  . TEE WITHOUT CARDIOVERSION N/A 06/16/2019   Procedure: TRANSESOPHAGEAL ECHOCARDIOGRAM (TEE);  Surgeon: Dorothy Spark, MD;  Location: [facility] ENDOSCOPY;  Service: Cardiovascular;  Laterality: N/A;    History reviewed. No pertinent family history. Social History:  reports that he has never smoked. He has never used smokeless tobacco. He reports previous alcohol use. He reports previous drug use.   Addendum: Patient reports no previous alcohol use.  He reports no previous drug use.    Allergies:  Allergies  Allergen Reactions  . Sulfa Antibiotics Rash    Total body rash    Medications Prior to Admission  Medication Sig Dispense Refill  . acetaminophen (TYLENOL) 325 MG tablet Take 2 tablets (650 mg total)  by mouth every 8 (eight) hours as needed for mild pain or fever.    Marland Kitchen ceFAZolin (ANCEF) IVPB Inject 2 g into the vein every 8 (eight) hours. Indication:  MSSA bacteremia/gluteal abscess Last Day of Therapy:  07/29/2019 Labs - Once weekly:  CBC/D and BMP, Labs - Every other week:  ESR and CRP 117 Units 0  . cholecalciferol (VITAMIN D3) 25 MCG (1000 UT) tablet Take 2,000 Units by mouth daily.    Marland Kitchen docusate sodium (COLACE) 100 MG capsule Take 1 capsule (100 mg total) by mouth 2 (two) times daily. 10 capsule 0  . Ensure Max Protein (ENSURE MAX PROTEIN) LIQD Take 330 mLs (11 oz total) by mouth daily. 330 mL 12  . insulin aspart (NOVOLOG) 100 UNIT/ML injection Inject 8 Units into the skin 3 (three) times daily with meals. 10 mL 0  . Multiple Vitamin (MULTIVITAMIN WITH MINERALS) TABS tablet Take 1 tablet by mouth daily.    . polyethylene glycol (MIRALAX / GLYCOLAX) 17 g packet Take 17 g by mouth daily as needed for mild constipation or moderate constipation. 14 each 0  . vitamin C (ASCORBIC ACID) 500 MG tablet Take 1,000 mg by mouth daily.    . blood glucose meter kit and supplies KIT Dispense based on patient and insurance preference. Use up to four times daily as directed. (FOR ICD-9 250.00, 250.01). 1 each 0  . insulin glargine (LANTUS) 100 UNIT/ML injection Inject 0.24 mLs (24 Units total) into the skin daily. 10 mL  0  . Insulin Syringes, Disposable, U-100 0.5 ML MISC Use as directed 3 times daily AC. 200 each 0  . oxyCODONE-acetaminophen (PERCOCET/ROXICET) 5-325 MG tablet Take 1 tablet by mouth every 8 (eight) hours as needed for moderate pain or severe pain. 15 tablet 0    Results for orders placed or performed during the hospital encounter of 07/09/19 (from the past 48 hour(s))  Respiratory Panel by RT PCR (Flu A&B, Covid) - Nasopharyngeal Swab     Status: None   Collection Time: 07/09/19  1:36 PM   Specimen: Nasopharyngeal Swab  Result Value Ref Range   SARS Coronavirus 2 by RT PCR NEGATIVE  NEGATIVE    Comment: (NOTE) SARS-CoV-2 target nucleic acids are NOT DETECTED. The SARS-CoV-2 RNA is generally detectable in upper respiratoy specimens during the acute phase of infection. The lowest concentration of SARS-CoV-2 viral copies this assay can detect is 131 copies/mL. A negative result does not preclude SARS-Cov-2 infection and should not be used as the sole basis for treatment or other patient management decisions. A negative result may occur with  improper specimen collection/handling, submission of specimen other than nasopharyngeal swab, presence of viral mutation(s) within the areas targeted by this assay, and inadequate number of viral copies (<131 copies/mL). A negative result must be combined with clinical observations, patient history, and epidemiological information. The expected result is Negative. Fact Sheet for Patients:  PinkCheek.be Fact Sheet for Healthcare Providers:  GravelBags.it This test is not yet ap proved or cleared by the [place] FDA and  has been authorized for detection and/or diagnosis of SARS-CoV-2 by FDA under an Emergency Use Authorization (EUA). This EUA will remain  in effect (meaning this test can be used) for the duration of the COVID-19 declaration under Section 564(b)(1) of the Act, 21 U.S.C. section 360bbb-3(b)(1), unless the authorization is terminated or revoked sooner.    Influenza A by PCR NEGATIVE NEGATIVE   Influenza B by PCR NEGATIVE NEGATIVE    Comment: (NOTE) The Xpert Xpress SARS-CoV-2/FLU/RSV assay is intended as an aid in  the diagnosis of influenza from Nasopharyngeal swab specimens and  should not be used as a sole basis for treatment. Nasal washings and  aspirates are unacceptable for Xpert Xpress SARS-CoV-2/FLU/RSV  testing. Fact Sheet for Patients: PinkCheek.be Fact Sheet for Healthcare  Providers: GravelBags.it This test is not yet approved or cleared by the [place] FDA and  has been authorized for detection and/or diagnosis of SARS-CoV-2 by  FDA under an Emergency Use Authorization (EUA). This EUA will remain  in effect (meaning this test can be used) for the duration of the  Covid-19 declaration under Section 564(b)(1) of the Act, 21  U.S.C. section 360bbb-3(b)(1), unless the authorization is  terminated or revoked. Performed at [facility], [place] [address], [place], [place] [address]   Glucose, capillary     Status: Abnormal   Collection Time: 07/09/19  1:46 PM  Result Value Ref Range   Glucose-Capillary 147 (H) 70 - 99 mg/dL   No results found.  Review of Systems  All other systems reviewed and are negative.   Blood pressure 122/70, pulse 90, temperature (!) 97.5 F (36.4 C), temperature source Oral, resp. rate 18, height 6' 3"  (1.905 m), weight (!) 165.6 kg, SpO2 99 %. Physical Exam  .  Examination patient is alert oriented no adenopathy well-dressed normal affect normal respiratory effort.  He has a strong dorsalis pedis pulse on the left he has good sciatic nerve function with  good plantar flexion dorsiflexion of the toes and ankle.  Examination left hip he has 2 surgical incisions that have cellulitis and drainage consistent with persistent infection patient also states he has been having increasing pain in the hip.  He does have a PICC line for IV antibiotics. Assessment/Plan Assessment: Recurrent infection left hip.  Plan: We will plan for repeat irrigation and debridement today placement of a installation wound VAC follow-up next week for repeat debridement and closure.  We will send soft tissue for cultures and anticipate will need to get infectious disease involved with antibiotic coverage.  Newt Minion, MD 07/09/2019, 3:44 PM

## 2019-07-09 NOTE — Anesthesia Preprocedure Evaluation (Addendum)
Anesthesia Evaluation  Patient identified by MRN, date of birth, ID band Patient awake    Reviewed: Allergy & Precautions, NPO status , Patient's Chart, lab work & pertinent test results  History of Anesthesia Complications Negative for: history of anesthetic complications  Airway Mallampati: I  TM Distance: >3 FB Neck ROM: Full    Dental  (+) Dental Advisory Given, Chipped   Pulmonary sleep apnea (does not use CPAP) ,  07/09/2019 SARS coronavirus NEG   breath sounds clear to auscultation       Cardiovascular hypertension (does not take BP meds),  Rhythm:Regular Rate:Normal  06/16/2019 ECHO: EF 60-65%, mild MR, mild TR, aortic sclerosis without stenosis   Neuro/Psych negative neurological ROS     GI/Hepatic negative GI ROS, Elevated LFTs   Endo/Other  diabetes, Insulin DependentMorbid obesity  Renal/GU negative Renal ROS     Musculoskeletal  (+) Arthritis ,   Abdominal (+) + obese,   Peds  Hematology  (+) Blood dyscrasia (Hb 10.7), anemia ,   Anesthesia Other Findings   Reproductive/Obstetrics                            Anesthesia Physical Anesthesia Plan  ASA: III  Anesthesia Plan: General   Post-op Pain Management:    Induction: Intravenous  PONV Risk Score and Plan: 3 and Ondansetron, Dexamethasone and Treatment may vary due to age or medical condition  Airway Management Planned: Oral ETT  Additional Equipment:   Intra-op Plan:   Post-operative Plan: Extubation in OR  Informed Consent: I have reviewed the patients History and Physical, chart, labs and discussed the procedure including the risks, benefits and alternatives for the proposed anesthesia with the patient or authorized representative who has indicated his/her understanding and acceptance.     Dental advisory given  Plan Discussed with: CRNA and Surgeon  Anesthesia Plan Comments:         Anesthesia  Quick Evaluation

## 2019-07-09 NOTE — Progress Notes (Signed)
[facility] Progress Note Patient Name: Robert Sullivan DOB: 1965-11-27 MRN: [id number]   Date of Service  07/09/2019  HPI/Events of Note  Pt with left hip abscess s/p I and D, patient was admitted to the ICU for post-operative hypotension requiring pressors and transfusion of blood products. He was extubated post-operatively.  eICU Interventions  New Patient Evaluation completed.        Kerry Kass Karlina Suares 07/09/2019, 10:24 PM

## 2019-07-09 NOTE — Consult Note (Addendum)
NAME:  Robert Sullivan, MRN:  [id number], DOB:  1966/01/05, LOS: 0 ADMISSION DATE:  07/09/2019, CONSULTATION DATE: 12/30 REFERRING MD: Dr. Sharol Given, CHIEF COMPLAINT: Postoperative shock  Brief History   53 year old male with recent history of fall and subsequent gluteal abscess.  He has undergone IR drainage and several surgical I&D.  He has been on IV antibiotics for approximately 1 month.  Was discharged about a week ago and now comes back with worsening edema and drainage from prior surgical site.  Taken to the OR for incision and drainage.  Postoperatively requiring phenylephrine infusion.  History of present illness   53 year old male with past medical history as below, which is significant for diabetes and OSA not on CPAP.  He was initially admitted back in the beginning of November after a slip and fall in the bathroom.  Work-up at that time was concerning for left gluteal hematoma as result of the fall.  He was treated for a UTI and discharged home.  He then again presented to [facility] emergency department on 12/1 with complaints of left gluteal and leg edema and pain.  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  He initially underwent IR drainage, but large abscess persisted and he was taken to the OR for I&D x 2 with wound vac placed. VAC discontinued on 12/20 and he was discharged on 12/21 to home with IV antibiotics. After about one week at home he was having significant drainage from old surgical site and called the orthopedic office. He was referred to the hospital for repeat I&D. He became hypotensive during the case and was started on phenylephrine. Post-operatively he couldn't be liberated from pressor and was transferred to ICU.   Past Medical History   has a past medical history of Arthritis (05/13/2019), Diabetes mellitus without complication ([place]), Headache, Sleep apnea, and Sleep apnea.   Significant Hospital Events     12/30 admit for I&D number 3  Consults:  Orthopedics PCCM  Procedures:  PICC pre-hosp I&D 12/30 >   Significant Diagnostic Tests:    Micro Data:  COVID, FLU > Neg Surgical 12/30   Antimicrobials:  Ancef >>>  Interim history/subjective:  No complaints  Objective   Blood pressure (!) 122/54, pulse 73, temperature 98 F (36.7 C), resp. rate 13, height 6' 3"  (1.905 m), weight (!) 165.6 kg, SpO2 100 %.        Intake/Output Summary (Last 24 hours) at 07/09/2019 2129 Last data filed at 07/09/2019 2024 Gross per 24 hour  Intake 1565 ml  Output 750 ml  Net 815 ml   Filed Weights   07/09/19 1346  Weight: (!) 165.6 kg    Examination: General: Morbidly obese male in NAD HENT: [place]/AT, PERRL, no JVD Lungs: Clear bilateral breath sounds Cardiovascular: RRR, no MRG Abdomen: soft, non-tender, non-distended Extremities: No acute deformity or ROM limitation Neuro: Alert, oriented, non-focal  Resolved Hospital Problem list     Assessment & Plan:   Shock: septic vs hypovolemic post-operative. - MAP goal 65 - Hydrate - PRBC  2nd unit finishing now - Phenylephrine infusion. Weaning.  Left gluteal abscess s/p IR drain and I&D x 2 earlier this month. Now I&D again 12/30. - Management per ortho - Ancef continue. Original stop date was 1/19, suspect this will be longer now - Consider ID consultation.  - Wound VAC  DM - CBG monitoring and SSI - Home lantus  OSA not on CPAP - monitor  Best practice:  Diet: per primary Pain/Anxiety/Delirium protocol (if indicated): Per primary VAP protocol (if indicated): NA DVT prophylaxis: per primary GI prophylaxis: NA Glucose control: SSI, lantus Mobility: BR Code Status: FULL Family Communication: Per primary Disposition: ICU  Labs   CBC: Recent Labs  Lab 07/08/19 2042 07/09/19 1744  WBC 7.9  --   NEUTROABS 5.5  --   HGB 10.7* 9.5*  HCT 34.7* 28.0*  MCV 100.9*  --   PLT 142*  --     Basic Metabolic  Panel: Recent Labs  Lab 07/08/19 2042 07/09/19 1744  NA 138 140  K 4.8 4.4  CL 107  --   CO2 21*  --   GLUCOSE 99  --   BUN 13  --   CREATININE 0.52*  --   CALCIUM 8.0*  --    GFR: Estimated Creatinine Clearance: 178.6 mL/min (A) (by C-G formula based on SCr of 0.52 mg/dL (L)). Recent Labs  Lab 07/08/19 2042  WBC 7.9    Liver Function Tests: No results for input(s): AST, ALT, ALKPHOS, BILITOT, PROT, ALBUMIN in the last 168 hours. No results for input(s): LIPASE, AMYLASE in the last 168 hours. No results for input(s): AMMONIA in the last 168 hours.  ABG    Component Value Date/Time   HCO3 27.6 07/09/2019 1744   TCO2 29 07/09/2019 1744   O2SAT 93.0 07/09/2019 1744     Coagulation Profile: No results for input(s): INR, PROTIME in the last 168 hours.  Cardiac Enzymes: No results for input(s): CKTOTAL, CKMB, CKMBINDEX, TROPONINI in the last 168 hours.  HbA1C: Hgb A1c MFr Bld  Date/Time Value Ref Range Status  05/13/2019 03:55 AM 11.0 (H) 4.8 - 5.6 % Final    Comment:    (NOTE) Pre diabetes:          5.7%-6.4% Diabetes:              >6.4% Glycemic control for   <7.0% adults with diabetes     CBG: Recent Labs  Lab 07/09/19 1346 07/09/19 1815  GLUCAP 147* 112*    Review of Systems:    Bolds are positive  Constitutional: weight loss, gain, night sweats, Fevers, chills, fatigue .  HEENT: headaches, Sore throat, sneezing, nasal congestion, post nasal drip, Difficulty swallowing, Tooth/dental problems, visual complaints visual changes, ear ache CV:  chest pain, radiates:,Orthopnea, PND, swelling in lower extremities, dizziness, palpitations, syncope.  GI  heartburn, indigestion, abdominal pain, nausea, vomiting, diarrhea, change in bowel habits, loss of appetite, bloody stools.  Resp: cough, productive:, hemoptysis, dyspnea, chest pain, pleuritic.  Skin: rash or itching or icterus GU: dysuria, change in color of urine, urgency or frequency. flank pain,  hematuria  MS: L hip pain or swelling. decreased range of motion  Psych: change in mood or affect. depression or anxiety.  Neuro: difficulty with speech, weakness, numbness, ataxia    Past Medical History  He,  has a past medical history of Arthritis (05/13/2019), Diabetes mellitus without complication ([place]), Headache, Sleep apnea, and Sleep apnea.   Surgical History    Past Surgical History:  Procedure Laterality Date  . INCISION AND DRAINAGE ABSCESS Left 06/23/2019   Procedure: INCISION AND DRAINAGE GLUTEAL ABSCESS;  Surgeon: Renette Butters, MD;  Location: [facility];  Service: Orthopedics;  Laterality: Left;  . INCISION AND DRAINAGE HIP Left 06/25/2019   Procedure: IRRIGATION AND DEBRIDEMENT HIP;  Surgeon: Shona Needles, MD;  Location: [facility];  Service: Orthopedics;  Laterality: Left;  . IR US  GUIDE BX ASP/DRAIN  06/17/2019  . TEE WITHOUT CARDIOVERSION N/A 06/16/2019   Procedure: TRANSESOPHAGEAL ECHOCARDIOGRAM (TEE);  Surgeon: Dorothy Spark, MD;  Location: [facility] ENDOSCOPY;  Service: Cardiovascular;  Laterality: N/A;     Social History   reports that he has never smoked. He has never used smokeless tobacco. He reports previous alcohol use. He reports previous drug use.   Family History   His family history is not on file.   Allergies Allergies  Allergen Reactions  . Sulfa Antibiotics Rash    Total body rash     Home Medications  Prior to Admission medications   Medication Sig Start Date End Date Taking? Authorizing Provider  acetaminophen (TYLENOL) 325 MG tablet Take 2 tablets (650 mg total) by mouth every 8 (eight) hours as needed for mild pain or fever. 06/30/19  Yes Hongalgi, Lenis Dickinson, MD  ceFAZolin (ANCEF) IVPB Inject 2 g into the vein every 8 (eight) hours. Indication:  MSSA bacteremia/gluteal abscess Last Day of Therapy:  07/29/2019 Labs - Once weekly:  CBC/D and BMP, Labs - Every other week:  ESR and CRP 06/20/19 07/29/19 Yes Truett Mainland, DO  cholecalciferol  (VITAMIN D3) 25 MCG (1000 UT) tablet Take 2,000 Units by mouth daily.   Yes [provider]  docusate sodium (COLACE) 100 MG capsule Take 1 capsule (100 mg total) by mouth 2 (two) times daily. 06/30/19  Yes Hongalgi, Lenis Dickinson, MD  Ensure Max Protein (ENSURE MAX PROTEIN) LIQD Take 330 mLs (11 oz total) by mouth daily. 07/01/19  Yes Hongalgi, Lenis Dickinson, MD  insulin aspart (NOVOLOG) 100 UNIT/ML injection Inject 8 Units into the skin 3 (three) times daily with meals. 06/30/19  Yes Hongalgi, Lenis Dickinson, MD  Multiple Vitamin (MULTIVITAMIN WITH MINERALS) TABS tablet Take 1 tablet by mouth daily. 07/01/19  Yes Hongalgi, Lenis Dickinson, MD  polyethylene glycol (MIRALAX / GLYCOLAX) 17 g packet Take 17 g by mouth daily as needed for mild constipation or moderate constipation. 06/30/19  Yes Hongalgi, Lenis Dickinson, MD  vitamin C (ASCORBIC ACID) 500 MG tablet Take 1,000 mg by mouth daily.   Yes [provider]  blood glucose meter kit and supplies KIT Dispense based on patient and insurance preference. Use up to four times daily as directed. (FOR ICD-9 250.00, 250.01). 06/30/19   Hongalgi, Lenis Dickinson, MD  insulin glargine (LANTUS) 100 UNIT/ML injection Inject 0.24 mLs (24 Units total) into the skin daily. 07/01/19   Hongalgi, Lenis Dickinson, MD  Insulin Syringes, Disposable, U-100 0.5 ML MISC Use as directed 3 times daily AC. 06/30/19   Hongalgi, Lenis Dickinson, MD  oxyCODONE-acetaminophen (PERCOCET/ROXICET) 5-325 MG tablet Take 1 tablet by mouth every 8 (eight) hours as needed for moderate pain or severe pain. 06/30/19   Modena Jansky, MD     Critical care time: 30 min     Georgann Housekeeper, AGACNP-BC [place] for personal pager PCCM on call pager [phone]  07/09/2019 9:57 PM Case personally reviewed, examined at bedside and critical care planning discussed with NP Hoffman.  Patient looks pretty good at this point.  Will wean pressors over night if possible.  Agree with above  assessment and plan.

## 2019-07-09 NOTE — Anesthesia Procedure Notes (Signed)
Procedure Name: Intubation Date/Time: 07/09/2019 5:10 PM Performed by: Barrington Ellison, CRNA Pre-anesthesia Checklist: Patient identified, Emergency Drugs available, Suction available and Patient being monitored Patient Re-evaluated:Patient Re-evaluated prior to induction Oxygen Delivery Method: Circle System Utilized Preoxygenation: Pre-oxygenation with 100% oxygen Induction Type: IV induction Ventilation: Mask ventilation without difficulty Laryngoscope Size: Mac and 4 Grade View: Grade I Tube type: Oral Tube size: 7.5 mm Number of attempts: 1 Airway Equipment and Method: Stylet and Oral airway Placement Confirmation: ETT inserted through vocal cords under direct vision,  positive ETCO2 and breath sounds checked- equal and bilateral Secured at: 22 cm Tube secured with: Tape Dental Injury: Teeth and Oropharynx as per pre-operative assessment

## 2019-07-09 NOTE — Op Note (Signed)
07/09/2019  6:47 PM  PATIENT:  Robert Sullivan    PRE-OPERATIVE DIAGNOSIS:  PELVIC/BUTTOCK ABSCESS  POST-OPERATIVE DIAGNOSIS:  Same  PROCEDURE:  INCISION AND DRAINAGE LEFT HIP/PELVIC ABSCESS, With excision skin soft tissue muscle and fascia. APPLICATION OF NEGATIVE PRESSURE WOUND VAC Local tissue rearrangement for wound closure 30 x 15 cm. Tissue and abscess sent for cultures.  SURGEON:  Newt Minion, MD  PHYSICIAN ASSISTANT:None ANESTHESIA:   General  PREOPERATIVE INDICATIONS:  Robert Sullivan is a  53 y.o. male with a diagnosis of PELVIC/BUTTOCK ABSCESS who failed conservative measures and elected for surgical management.    The risks benefits and alternatives were discussed with the patient preoperatively including but not limited to the risks of infection, bleeding, nerve injury, cardiopulmonary complications, the need for revision surgery, among others, and the patient was willing to proceed.  OPERATIVE IMPLANTS: Cleanse choice wound VAC sponges x6  @ENCIMAGES @  OPERATIVE FINDINGS: Large deep abscess with necrotic muscle fascia with a large fluctuant abscess  OPERATIVE PROCEDURE: Patient was brought the operating room and underwent a general anesthetic.  After adequate levels anesthesia obtained patient was placed in the right lateral decubitus position with the left side up in the left lower extremity was prepped using DuraPrep draped into a sterile field a timeout was called.  A football shaped incision was made around the previous incision and the cellulitic skin.  This left a wound that was 30 x 15 cm and approximately 7 cm deep.  There was a large fluid abscess this was drained there was a large amount of necrotic muscle skin and fascia.  This was excised the wound was irrigated with saline.  All tissue was sent for cultures.  For wound vacs sponges cleanse choice were placed deep within the wound.  Local tissue rearrangement was used to close the wound over the wound VAC sponges  with wound closure of 30 x 15 cm.  This was then covered with 2 additional wound VAC sponges sealed with Ioban and connected to the wound VAC canister.  This had a good suction fit patient was extubated taken to the PACU in stable condition.   DISCHARGE PLANNING:  Antibiotic duration: Continue IV antibiotics  Weightbearing: Weightbearing as tolerated  Pain medication: Opioid pathway  Dressing care/ Wound VAC: Continue wound VAC with cleanse choice  Ambulatory devices: Walker or crutches  Discharge to: Plan for return to the operating room in 1 week with repeat irrigation and debridement antibiotics adjusted her culture sensitivity.  Follow-up: In the office 1 week post operative.

## 2019-07-09 NOTE — Transfer of Care (Addendum)
Immediate Anesthesia Transfer of Care Note  Patient: Robert Sullivan  Procedure(s) Performed: INCISION AND DRAINAGE LEFT HIP/PELVIC ABSCESS, APPLICATION OF NEGATIVE PRESSURE WOUND VAC (Left Hip)  Patient Location: PACU  Anesthesia Type:General  Level of Consciousness: drowsy and patient cooperative  Airway & Oxygen Therapy: Patient Spontanous Breathing  Post-op Assessment: Report given to RN  Post vital signs: Reviewed and unstable  Last Vitals:  Vitals Value Taken Time  BP    Temp    Pulse 81 07/09/19 1816  Resp 16 07/09/19 1816  SpO2 94 % 07/09/19 1816  Vitals shown include unvalidated device data.  Last Pain:  Vitals:   07/09/19 1413  TempSrc:   PainSc: 2       Patients Stated Pain Goal: 3 (07/13/02 5913)  Complications: hypotention

## 2019-07-10 ENCOUNTER — Other Ambulatory Visit: Payer: Self-pay | Admitting: Physician Assistant

## 2019-07-10 ENCOUNTER — Inpatient Hospital Stay (HOSPITAL_COMMUNITY): Payer: Self-pay

## 2019-07-10 DIAGNOSIS — Z8619 Personal history of other infectious and parasitic diseases: Secondary | ICD-10-CM

## 2019-07-10 DIAGNOSIS — E119 Type 2 diabetes mellitus without complications: Secondary | ICD-10-CM

## 2019-07-10 DIAGNOSIS — B9561 Methicillin susceptible Staphylococcus aureus infection as the cause of diseases classified elsewhere: Secondary | ICD-10-CM

## 2019-07-10 DIAGNOSIS — Z881 Allergy status to other antibiotic agents status: Secondary | ICD-10-CM

## 2019-07-10 DIAGNOSIS — R7881 Bacteremia: Secondary | ICD-10-CM

## 2019-07-10 LAB — CBC
HCT: 29.2 % — ABNORMAL LOW (ref 39.0–52.0)
Hemoglobin: 9.7 g/dL — ABNORMAL LOW (ref 13.0–17.0)
MCH: 32.1 pg (ref 26.0–34.0)
MCHC: 33.2 g/dL (ref 30.0–36.0)
MCV: 96.7 fL (ref 80.0–100.0)
Platelets: 149 10*3/uL — ABNORMAL LOW (ref 150–400)
RBC: 3.02 MIL/uL — ABNORMAL LOW (ref 4.22–5.81)
RDW: 17.2 % — ABNORMAL HIGH (ref 11.5–15.5)
WBC: 12 10*3/uL — ABNORMAL HIGH (ref 4.0–10.5)
nRBC: 0 % (ref 0.0–0.2)

## 2019-07-10 LAB — BASIC METABOLIC PANEL
Anion gap: 7 (ref 5–15)
BUN: 8 mg/dL (ref 6–20)
CO2: 24 mmol/L (ref 22–32)
Calcium: 7.9 mg/dL — ABNORMAL LOW (ref 8.9–10.3)
Chloride: 108 mmol/L (ref 98–111)
Creatinine, Ser: 0.55 mg/dL — ABNORMAL LOW (ref 0.61–1.24)
GFR calc Af Amer: >60 mL/min (ref >60)
GFR calc non Af Amer: >60 mL/min (ref >60)
Glucose, Bld: 153 mg/dL — ABNORMAL HIGH (ref 70–99)
Potassium: 3.6 mmol/L (ref 3.5–5.1)
Sodium: 139 mmol/L (ref 135–145)

## 2019-07-10 LAB — GLUCOSE, CAPILLARY
Glucose-Capillary: 174 mg/dL — ABNORMAL HIGH (ref 70–99)
Glucose-Capillary: 188 mg/dL — ABNORMAL HIGH (ref 70–99)
Glucose-Capillary: 154 mg/dL — ABNORMAL HIGH (ref 70–99)
Glucose-Capillary: 152 mg/dL — ABNORMAL HIGH (ref 70–99)

## 2019-07-10 LAB — POCT I-STAT EG7
Acid-base deficit: 1 mmol/L (ref 0.0–2.0)
Bicarbonate: 24.5 mmol/L (ref 20.0–28.0)
Calcium, Ion: 1.2 mmol/L (ref 1.15–1.40)
HCT: 23 % — ABNORMAL LOW (ref 39.0–52.0)
Hemoglobin: 7.8 g/dL — ABNORMAL LOW (ref 13.0–17.0)
O2 Saturation: 72 %
Patient temperature: 36.7
Potassium: 4 mmol/L (ref 3.5–5.1)
Sodium: 142 mmol/L (ref 135–145)
TCO2: 26 mmol/L (ref 22–32)
pCO2, Ven: 44.5 mmHg (ref 44.0–60.0)
pH, Ven: 7.347 (ref 7.250–7.430)
pO2, Ven: 39 mmHg (ref 32.0–45.0)

## 2019-07-10 LAB — HEMOGLOBIN A1C
Hgb A1c MFr Bld: 5.5 % (ref 4.8–5.6)
Mean Plasma Glucose: 111.15 mg/dL

## 2019-07-10 LAB — ALBUMIN: Albumin: 1.9 g/dL — ABNORMAL LOW (ref 3.5–5.0)

## 2019-07-10 IMAGING — MR MR HIP*L* WO/W CM
10 series · 40 of 40 positions shown · IV contrast (gadavist)
Comparison: MRI [DATE] and CT scan [DATE]

CLINICAL DATA: Follow-up left gluteal abscess.

EXAM:
MRI OF THE LEFT HIP WITHOUT AND WITH CONTRAST
TECHNIQUE: Multiplanar, multisequence MR imaging was performed both before and
after administration of intravenous contrast.
CONTRAST:  10mL GADAVIST GADOBUTROL 1 MMOL/ML IV SOLN

[Series 6: T1 · coronal · left · 5.0mm · 1.30mm/px · 4 of 50 slices shown]
[im 1/50]
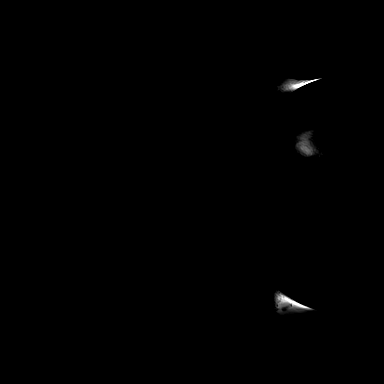
[im 17/50]
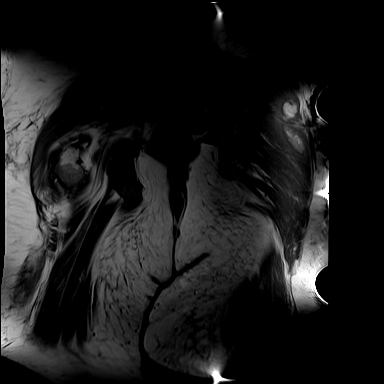
[im 33/50]
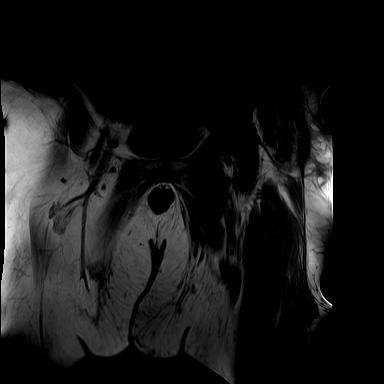
[im 50/50]
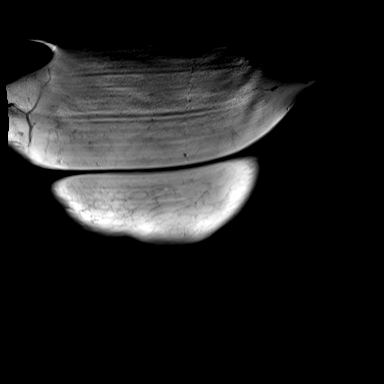

[Series 7: STIR · coronal · left · 5.0mm · 1.30mm/px · 5 of 50 slices shown]
[im 1/50]
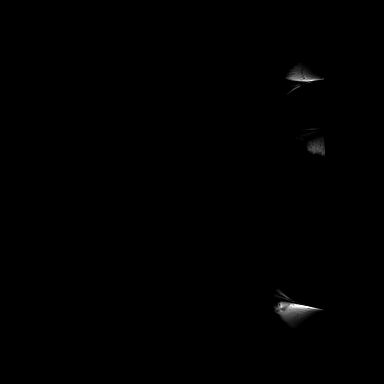
[im 13/50]
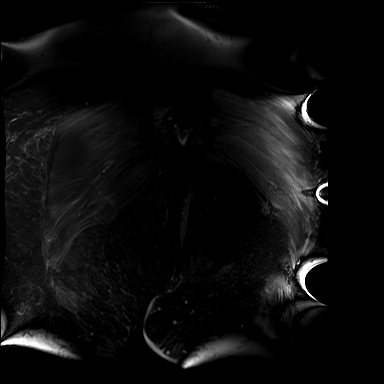
[im 25/50]
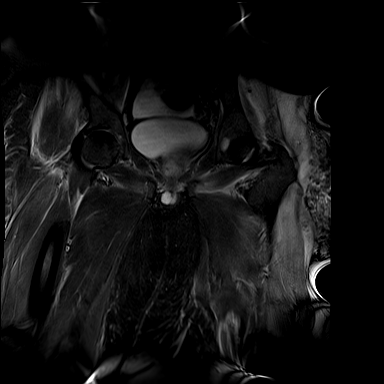
[im 37/50]
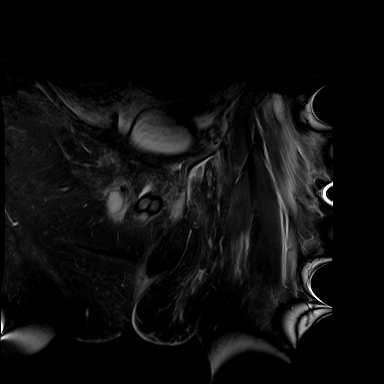
[im 50/50]
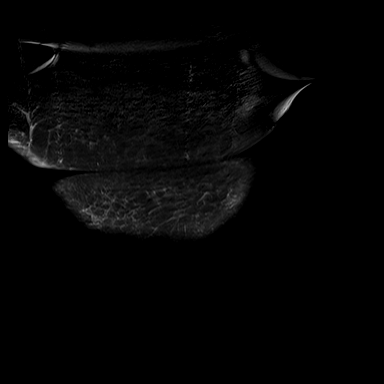

[Series 8: T2 fat-sat · coronal · left · 5.0mm · 1.12mm/px · 5 of 50 slices shown (1 of 2)]
[im 1/50]
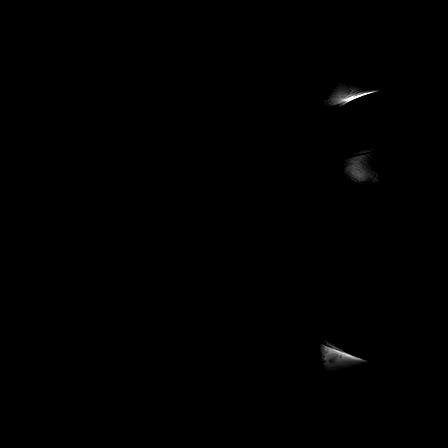
[im 13/50]
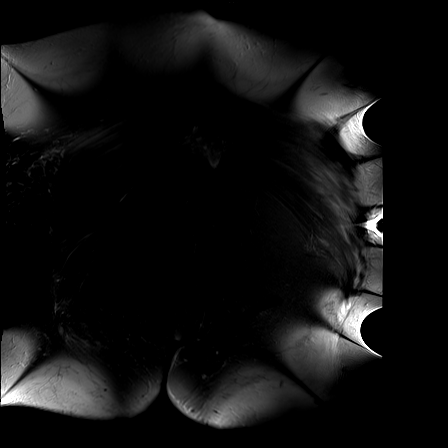
[im 25/50]
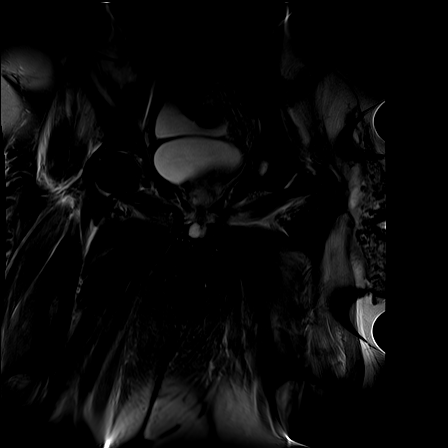
[im 37/50]
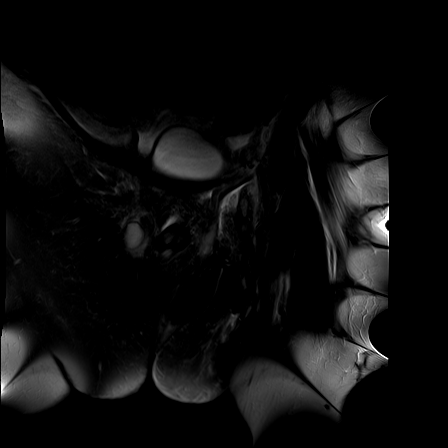
[im 50/50]
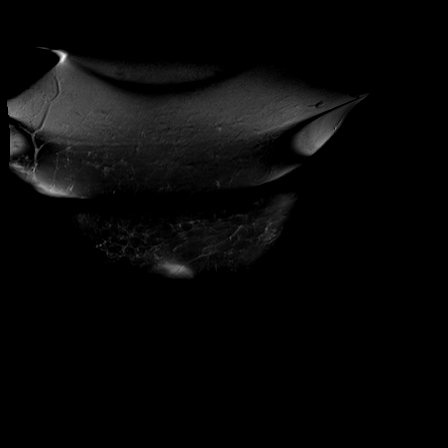

[Series 9: T2 fat-sat · axial · left · 7.0mm · 0.70mm/px · z∈[-58,+240]mm · 4 of 35 slices shown (2 of 2)]
[im 1/35]
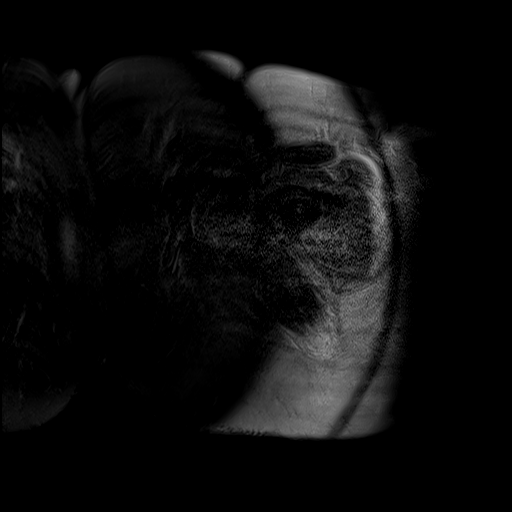
[im 12/35]
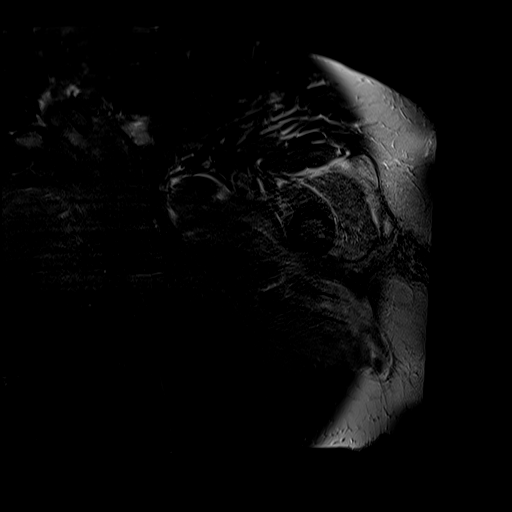
[im 23/35]
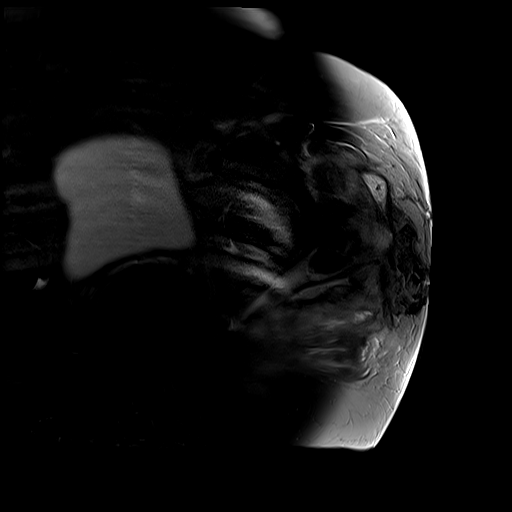
[im 35/35]
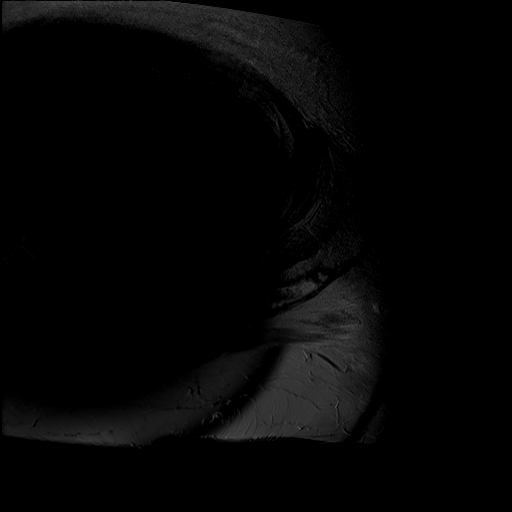

[Series 10: PD fat-sat · sagittal · left · 7.0mm · 0.94mm/px · 3 of 30 slices shown (1 of 2)]
[im 1/30]
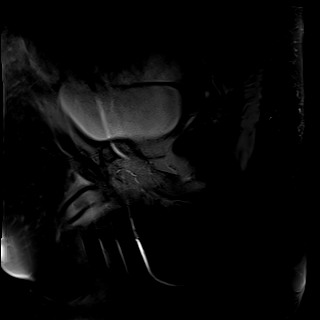
[im 15/30]
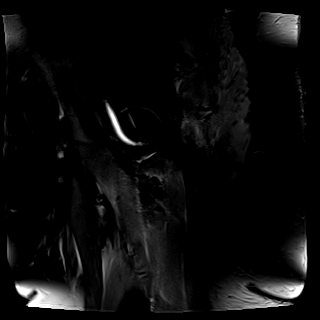
[im 30/30]
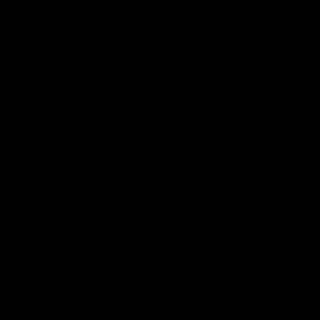

[Series 11: PD fat-sat · coronal · left · 7.0mm · 1.17mm/px · 4 of 40 slices shown (2 of 2)]
[im 1/40]
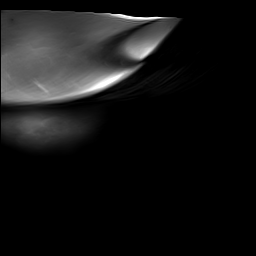
[im 14/40]
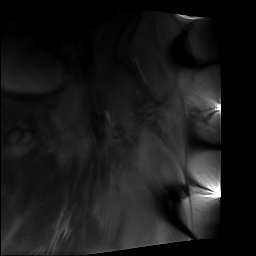
[im 27/40]
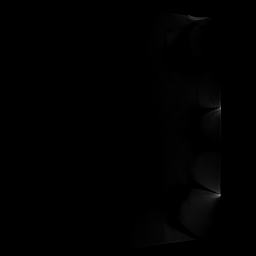
[im 40/40]
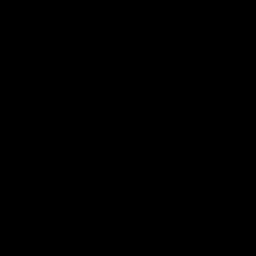

[Series 12: T1 fat-sat · axial · non-contrast · left · 7.0mm · 1.44mm/px · z∈[-58,+240]mm · 4 of 35 slices shown]
[im 1/35]
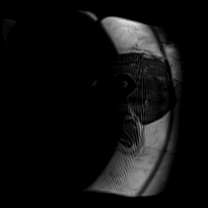
[im 12/35]
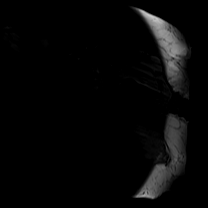
[im 23/35]
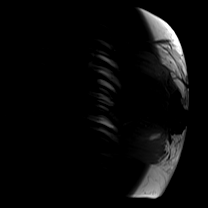
[im 35/35]
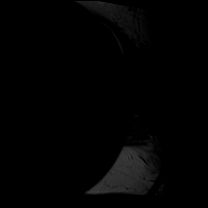

[Series 13: T1 fat-sat post-contrast · axial · left · 7.0mm · 1.44mm/px · z∈[-58,+240]mm · 4 of 35 slices shown (1 of 3)]
[im 1/35]
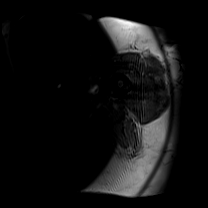
[im 12/35]
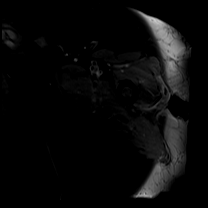
[im 23/35]
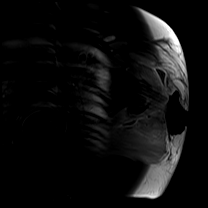
[im 35/35]
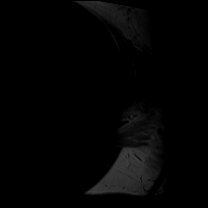

[Series 14: T1 fat-sat post-contrast · coronal · left · 7.0mm · 1.44mm/px · 4 of 40 slices shown (2 of 3)]
[im 1/40]
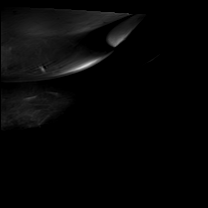
[im 14/40]
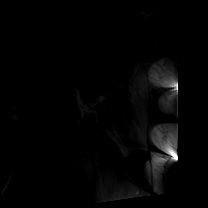
[im 27/40]
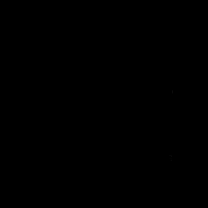
[im 40/40]
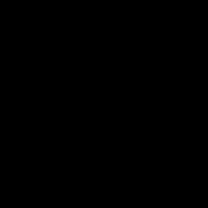

[Series 15: T1 fat-sat post-contrast · sagittal · left · 7.0mm · 1.44mm/px · 3 of 29 slices shown (3 of 3)]
[im 1/29]
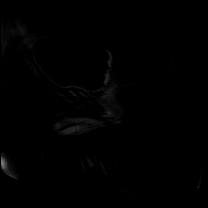
[im 15/29]
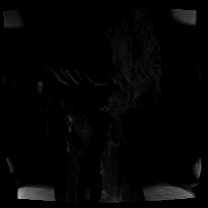
[im 29/29]
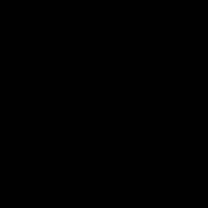

[40 of 40 positions shown; findings below may reference images not displayed]

FINDINGS: Examination is quite limited due to body habitus and motion. Both
hips are normally located. No findings suspicious for septic
arthritis or osteomyelitis involving the hips. The pubic symphysis
and SI joints are intact. No evidence of septic arthritis.

The patient had a huge left gluteal abscess on the prior MRI from
[DATE]. This is vastly improved. There are a few small residual
abscesses measuring less than 3 cm. Small gluteal hematomas are also
noted. Persistent surrounding myofasciitis. There is also a large
open wound in the lateral buttock region.

Diffuse myositis involving the right gluteal muscles and right
lateral thigh muscles. There is also diffuse myositis involving the
left adductor muscles and the left thigh muscles. No definite
findings for new areas of pyomyositis.

Moderate free pelvic fluid is noted.
IMPRESSION: 1. Very limited examination due to body habitus and motion.
2. Vastly improved left gluteal abscess. There are few small sub 3
cm abscesses and a few small hematomas.
3. Persistent diffuse myofasciitis involving the hip and pelvic
musculature bilaterally but no new areas of pyomyositis.
4. No definite MR findings for septic arthritis or osteomyelitis.
5. Moderate free pelvic fluid noted.

## 2019-07-10 MED ORDER — CEFAZOLIN SODIUM-DEXTROSE 2-4 GM/100ML-% IV SOLN
2.0000 g | Freq: Three times a day (TID) | INTRAVENOUS | Status: DC
  Administered 2019-07-10 – 2019-07-11 (×3): 2 g via INTRAVENOUS
  Filled 2019-07-10 (×4): qty 100

## 2019-07-10 MED ORDER — PHENYLEPHRINE HCL-NACL 10-0.9 MG/250ML-% IV SOLN
0.0000 ug/min | INTRAVENOUS | Status: DC
  Administered 2019-07-10: 50 ug/min via INTRAVENOUS

## 2019-07-10 MED ORDER — GADOBUTROL 1 MMOL/ML IV SOLN
10.0000 mL | Freq: Once | INTRAVENOUS | Status: AC | PRN
  Administered 2019-07-10: 10 mL via INTRAVENOUS

## 2019-07-10 NOTE — Consult Note (Signed)
Date of Admission:  07/09/2019          Reason for Consult: Large leg abscess   Referring Provider: Dr. Sharol Given   Assessment:  1. Complicated MSSA bacteremia with  2. Large left hip abscess that may involve joint 3. Morbid obesity 4. DM  Plan:  1. Continue cefazolin but would likely change the stop date to further down the road with day 1 being today unless he goes back to the operating room again 2. Repeat MRI of his left hip 3. Recheck inflammatory markers 4. Follow-up intraoperative cultures    Active Problems:   Abscess of left hip   Blister of hip with infection   Scheduled Meds: . sodium chloride   Intravenous Once  . Chlorhexidine Gluconate Cloth  6 each Topical Daily  . insulin aspart  0-15 Units Subcutaneous TID WC  . insulin aspart  0-5 Units Subcutaneous QHS  . insulin glargine  16 Units Subcutaneous Daily  . Ensure Max Protein  11 oz Oral Daily   Continuous Infusions: . sodium chloride Stopped (07/10/19 1252)  .  ceFAZolin (ANCEF) IV Stopped (07/10/19 1257)  . phenylephrine (NEO-SYNEPHRINE) Adult infusion Stopped (07/10/19 0505)   PRN Meds:.acetaminophen, HYDROmorphone (DILAUDID) injection, metoCLOPramide **OR** metoCLOPramide (REGLAN) injection, ondansetron **OR** ondansetron (ZOFRAN) IV, oxyCODONE  HPI: Robert Sullivan is a 53 y.o. male with morbid obesity and diabetes mellitus who was admitted in the hospital earlier this month with complicated methicillin sensitive Staph aureus bacteremia.  Source of infection appear to be large thigh abscess.  There was quite a bit of difficulty controlling the site of infection with initial attempts with only antibiotics followed by percutaneous drain followed by 2 surgeries by Dr. Percell Miller and the patient being discharged with A wound vacuum dressing on 21 December.  Unfortunately he was having copious drainage from his wound despite the wound vacuum.  He was seen in the orthopedic surgery clinic and they referred him to  Dr. Sharol Given who arranged for surgery today. Dr. Sharol Given formed excisional debridement of the left hip and found a massive abscess.  There was 800 mL of purulent material removed during surgery along with tissue.  We will continue him on cefazolin and follow-up as intraoperative cultures.  I am also going to repeat an MRI of his hip as there was mention of this abscess communicating with the trochanter on MRI from earlier in December.  Certainly if he has septic hip as well he will need surgical attention to that area as well.  We also need to restart his antibiotics start date give him a more protracted parenteral course.    Review of Systems: Review of Systems  Constitutional: Negative for chills, diaphoresis, fever, malaise/fatigue and weight loss.  HENT: Negative for congestion, hearing loss, sore throat and tinnitus.   Eyes: Negative for blurred vision and double vision.  Respiratory: Negative for cough, sputum production, shortness of breath and wheezing.   Cardiovascular: Negative for chest pain, palpitations and leg swelling.  Gastrointestinal: Negative for abdominal pain, blood in stool, constipation, diarrhea, heartburn, melena, nausea and vomiting.  Genitourinary: Negative for dysuria, flank pain and hematuria.  Musculoskeletal: Positive for joint pain and myalgias. Negative for back pain and falls.  Skin: Negative for itching and rash.  Neurological: Negative for dizziness, sensory change, focal weakness, loss of consciousness, weakness and headaches.  Endo/Heme/Allergies: Does not bruise/bleed easily.  Psychiatric/Behavioral: Negative for depression, memory loss and suicidal ideas. The patient is not nervous/anxious.     Past Medical  History:  Diagnosis Date  . Arthritis 05/13/2019   knees  . Diabetes mellitus without complication ([place])   . Headache   . Sleep apnea   . Sleep apnea     Social History   Tobacco Use  . Smoking status: Never Smoker  . Smokeless tobacco:  Never Used  Substance Use Topics  . Alcohol use: Not Currently  . Drug use: Not Currently    History reviewed. No pertinent family history. Allergies  Allergen Reactions  . Sulfa Antibiotics Rash    Total body rash    OBJECTIVE: Blood pressure 129/65, pulse 93, temperature 98 F (36.7 C), temperature source Oral, resp. rate 14, height 6' 3"  (1.905 m), weight (!) 168.1 kg, SpO2 100 %.  Physical Exam Constitutional:      General: He is not in acute distress.    Appearance: Normal appearance. He is well-developed. He is not ill-appearing or diaphoretic.  HENT:     Head: Normocephalic and atraumatic.     Right Ear: Hearing and external ear normal.     Left Ear: Hearing and external ear normal.     Nose: No nasal deformity or rhinorrhea.  Eyes:     General: No scleral icterus.    Conjunctiva/sclera: Conjunctivae normal.     Right eye: Right conjunctiva is not injected.     Left eye: Left conjunctiva is not injected.     Pupils: Pupils are equal, round, and reactive to light.  Neck:     Vascular: No JVD.  Cardiovascular:     Rate and Rhythm: Normal rate and regular rhythm.     Heart sounds: S1 normal and S2 normal. No friction rub.  Pulmonary:     Effort: Pulmonary effort is normal. No respiratory distress.     Breath sounds: No wheezing.  Abdominal:     Palpations: Abdomen is soft.  Musculoskeletal:        General: Normal range of motion.     Right shoulder: Normal.     Left shoulder: Normal.     Cervical back: Normal range of motion and neck supple.     Right hip: Normal.     Left hip: Normal.     Right knee: Normal.     Left knee: Normal.  Lymphadenopathy:     Head:     Right side of head: No submandibular, preauricular or posterior auricular adenopathy.     Left side of head: No submandibular, preauricular or posterior auricular adenopathy.     Cervical: No cervical adenopathy.     Right cervical: No superficial or deep cervical adenopathy.    Left cervical: No  superficial or deep cervical adenopathy.  Skin:    General: Skin is warm and dry.     Coloration: Skin is not pale.     Findings: No abrasion, bruising, ecchymosis, erythema, lesion or rash.     Nails: There is no clubbing.  Neurological:     General: No focal deficit present.     Mental Status: He is alert and oriented to person, place, and time.     Sensory: No sensory deficit.     Coordination: Coordination normal.     Gait: Gait normal.  Psychiatric:        Attention and Perception: He is attentive.        Mood and Affect: Mood normal.        Speech: Speech normal.        Behavior: Behavior normal. Behavior is cooperative.  Thought Content: Thought content normal.        Judgment: Judgment normal.   leg with bandage  Lab Results Lab Results  Component Value Date   WBC 12.0 (H) 07/10/2019   HGB 9.7 (L) 07/10/2019   HCT 29.2 (L) 07/10/2019   MCV 96.7 07/10/2019   PLT 149 (L) 07/10/2019    Lab Results  Component Value Date   CREATININE 0.55 (L) 07/10/2019   BUN 8 07/10/2019   NA 139 07/10/2019   K 3.6 07/10/2019   CL 108 07/10/2019   CO2 24 07/10/2019    Lab Results  Component Value Date   ALT 33 06/29/2019   AST 105 (H) 06/29/2019   ALKPHOS 260 (H) 06/29/2019   BILITOT 1.1 06/29/2019     Microbiology: Recent Results (from the past 240 hour(s))  Respiratory Panel by RT PCR (Flu A&B, Covid) - Nasopharyngeal Swab     Status: None   Collection Time: 07/09/19  1:36 PM   Specimen: Nasopharyngeal Swab  Result Value Ref Range Status   SARS Coronavirus 2 by RT PCR NEGATIVE NEGATIVE Final    Comment: (NOTE) SARS-CoV-2 target nucleic acids are NOT DETECTED. The SARS-CoV-2 RNA is generally detectable in upper respiratoy specimens during the acute phase of infection. The lowest concentration of SARS-CoV-2 viral copies this assay can detect is 131 copies/mL. A negative result does not preclude SARS-Cov-2 infection and should not be used as the sole basis for  treatment or other patient management decisions. A negative result may occur with  improper specimen collection/handling, submission of specimen other than nasopharyngeal swab, presence of viral mutation(s) within the areas targeted by this assay, and inadequate number of viral copies (<131 copies/mL). A negative result must be combined with clinical observations, patient history, and epidemiological information. The expected result is Negative. Fact Sheet for Patients:  PinkCheek.be Fact Sheet for Healthcare Providers:  GravelBags.it This test is not yet ap proved or cleared by the [place] FDA and  has been authorized for detection and/or diagnosis of SARS-CoV-2 by FDA under an Emergency Use Authorization (EUA). This EUA will remain  in effect (meaning this test can be used) for the duration of the COVID-19 declaration under Section 564(b)(1) of the Act, 21 U.S.C. section 360bbb-3(b)(1), unless the authorization is terminated or revoked sooner.    Influenza A by PCR NEGATIVE NEGATIVE Final   Influenza B by PCR NEGATIVE NEGATIVE Final    Comment: (NOTE) The Xpert Xpress SARS-CoV-2/FLU/RSV assay is intended as an aid in  the diagnosis of influenza from Nasopharyngeal swab specimens and  should not be used as a sole basis for treatment. Nasal washings and  aspirates are unacceptable for Xpert Xpress SARS-CoV-2/FLU/RSV  testing. Fact Sheet for Patients: PinkCheek.be Fact Sheet for Healthcare Providers: GravelBags.it This test is not yet approved or cleared by the [place] FDA and  has been authorized for detection and/or diagnosis of SARS-CoV-2 by  FDA under an Emergency Use Authorization (EUA). This EUA will remain  in effect (meaning this test can be used) for the duration of the  Covid-19 declaration under Section 564(b)(1) of the Act, 21  U.S.C. section  360bbb-3(b)(1), unless the authorization is  terminated or revoked. Performed at [facility], [place] [address], [place], [place] [address]   Aerobic/Anaerobic Culture (surgical/deep wound)     Status: None (Preliminary result)   Collection Time: 07/09/19  5:29 PM   Specimen: Soft Tissue, Other  Result Value Ref Range Status   Specimen Description  TISSUE LEFT HIP  Final   Special Requests NONE  Final   Gram Stain   Final    RARE WBC PRESENT, PREDOMINANTLY PMN NO ORGANISMS SEEN    Culture   Final    TOO YOUNG TO READ Performed at [facility], [place] [address] [place], [place] [address]    Report Status PENDING  Incomplete  MRSA PCR Screening     Status: None   Collection Time: 07/09/19  9:24 PM   Specimen: Nasal Mucosa; Nasopharyngeal  Result Value Ref Range Status   MRSA by PCR NEGATIVE NEGATIVE Final    Comment:        The GeneXpert MRSA Assay (FDA approved for NASAL specimens only), is one component of a comprehensive MRSA colonization surveillance program. It is not intended to diagnose MRSA infection nor to guide or monitor treatment for MRSA infections. Performed at [facility], [place] [address] [place], [place] [address]     Alcide Evener, [place] [facility] [phone] pager  07/10/2019, 1:33 PM

## 2019-07-10 NOTE — Evaluation (Signed)
Physical Therapy Evaluation Patient Details Name: Robert Sullivan MRN: [id number] DOB: 1966/07/02 Today's Date: 07/10/2019   History of Present Illness  53 year old male with recent history of fall and subsequent gluteal abscess.  He has undergone IR drainage and several surgical I&D.  He has been on IV antibiotics for approximately 1 month.  Was discharged about a week ago and now comes back with worsening edema and drainage from prior surgical site. PMH including but not limited to DM2, OA, septic shock, obesity, and arthritis.  Clinical Impression  Pt admitted for above. He has impairments in strength, ROM, pain, balance, gait, and overall functional mobility. Pt able to complete bed mobility with min assist for trunk righting and transfers and gait min guard for safety. He required cueing for hand placement and sequencing with gait throughout session. He noted that he became a little dizzy half way through gait assessment and ambulated back to the chair, upon which his BP had dropped as described below. Despite difficult home set up, he declines going to a SNF and would prefer [facility]. He states that he feels as though his family will be able to help him mobilize safely in the home. Recommend OT consult as he stated he had difficulty toileting and bathing at home. Pt would benefit from continued skilled PT intervention in order to address impairments.   VS: Start of session: 98 bpm, 98%, 108/66 At EOB: 125 bpm, 99%, 119/59 End of session in chair: 111 bpm, 97%, 96/80    Follow Up Recommendations Home health PT    Equipment Recommendations  3in1 (PT)    Recommendations for Other Services OT consult     Precautions / Restrictions Precautions Precautions: Fall Precaution Comments: wound vac, drain Restrictions Weight Bearing Restrictions: Yes LLE Weight Bearing: Weight bearing as tolerated      Mobility  Bed Mobility Overal bed mobility: Needs Assistance Bed Mobility: Supine to Sit     Supine to sit: Min assist;HOB elevated     General bed mobility comments: increased time; heavy use of bed rails and min A hand held assist to right trunk  Transfers Overall transfer level: Needs assistance Equipment used: Rolling walker (2 wheeled) Transfers: Sit to/from Stand Sit to Stand: Min guard;From elevated surface         General transfer comment: increased time to power up, pt unable to adduct L LE enough to have both LE inside walker before standing; cues for hand placement; pt able to transfer without physical assistance  Ambulation/Gait Ambulation/Gait assistance: Min guard Gait Distance (Feet): 20 Feet Assistive device: Rolling walker (2 wheeled) Gait Pattern/deviations: Decreased stride length;Step-through pattern;Wide base of support Gait velocity: decreased   General Gait Details: distance limited by fatigue; HR within 132-145 bpm throughout; cues for sequencing as he has tendency to lead with R foot and take short steps with L  Stairs            Wheelchair Mobility    Modified Rankin (Stroke Patients Only)       Balance Overall balance assessment: Needs assistance Sitting-balance support: No upper extremity supported;Feet supported Sitting balance-Leahy Scale: Good Sitting balance - Comments: supervision for static sitting balance EOB   Standing balance support: Bilateral upper extremity supported;During functional activity Standing balance-Leahy Scale: Poor Standing balance comment: reliance on RW                             Pertinent Vitals/Pain Pain Assessment: Faces Faces  Pain Scale: Hurts little more Pain Location: L buttocks Pain Descriptors / Indicators: Grimacing;Guarding;Spasm;Discomfort Pain Intervention(s): Monitored during session;Limited activity within patient's tolerance;Repositioned    Home Living Family/patient expects to be discharged to:: Private residence Living Arrangements: Spouse/significant  other;Children Available Help at Discharge: Family;Available 24 hours/day Type of Home: House Home Access: Stairs to enter Entrance Stairs-Rails: None Entrance Stairs-Number of Steps: 4 Home Layout: Two level;Bed/bath upstairs Home Equipment: Walker - 2 wheels;Crutches      Prior Function Level of Independence: Independent with assistive device(s)         Comments: pt reports he was using crutches to negotiate stairs at home     Hand Dominance   Dominant Hand: Right    Extremity/Trunk Assessment   Upper Extremity Assessment Upper Extremity Assessment: Overall WFL for tasks assessed    Lower Extremity Assessment Lower Extremity Assessment: Generalized weakness LLE Deficits / Details: pain & weakness in LLE       Communication   Communication: No difficulties  Cognition Arousal/Alertness: Awake/alert Behavior During Therapy: WFL for tasks assessed/performed Overall Cognitive Status: Within Functional Limits for tasks assessed                                        General Comments      Exercises     Assessment/Plan    PT Assessment Patient needs continued PT services  PT Problem List Decreased strength;Decreased balance;Pain;Obesity;Cardiopulmonary status limiting activity;Decreased knowledge of use of DME;Decreased mobility;Decreased range of motion;Decreased activity tolerance;Decreased coordination;Decreased safety awareness;Decreased skin integrity       PT Treatment Interventions DME instruction;Gait training;Stair training;Functional mobility training;Therapeutic activities;Therapeutic exercise;Balance training;Neuromuscular re-education;Patient/family education;Modalities    PT Goals (Current goals can be found in the Care Plan section)  Acute Rehab PT Goals Patient Stated Goal: home PT Goal Formulation: With patient Time For Goal Achievement: 07/24/19 Potential to Achieve Goals: Good    Frequency Min 3X/week   Barriers to  discharge Inaccessible home environment pt states he cannot live on ground floor of his home and stated that going up and down his flight of stairs was difficult    Co-evaluation               AM-PAC PT "6 Clicks" Mobility  Outcome Measure Help needed turning from your back to your side while in a flat bed without using bedrails?: A Little Help needed moving from lying on your back to sitting on the side of a flat bed without using bedrails?: A Little Help needed moving to and from a bed to a chair (including a wheelchair)?: A Little Help needed standing up from a chair using your arms (e.g., wheelchair or bedside chair)?: A Little Help needed to walk in hospital room?: A Little Help needed climbing 3-5 steps with a railing? : A Lot 6 Click Score: 17    End of Session Equipment Utilized During Treatment: Gait belt Activity Tolerance: Patient tolerated treatment well Patient left: in chair;with call bell/phone within reach;with chair alarm set Nurse Communication: Mobility status PT Visit Diagnosis: Other abnormalities of gait and mobility (R26.89);Muscle weakness (generalized) (M62.81);Difficulty in walking, not elsewhere classified (R26.2);Pain Pain - Right/Left: Left Pain - part of body: Hip    Time: 0930-1009 PT Time Calculation (min) (ACUTE ONLY): 39 min   Charges:   PT Evaluation $PT Eval Moderate Complexity: 1 Mod PT Treatments $Gait Training: 8-22 mins $Therapeutic Activity: 8-22 mins  Christel Mormon, SPT   Robert Sullivan 07/10/2019, 11:11 AM

## 2019-07-10 NOTE — H&P (View-Only) (Signed)
Patient ID: Robert Sullivan, male   DOB: 1966-04-11, 53 y.o.   MRN: [id number] Patient is status post excisional debridement left hip massive abscess.  Tissues were sent for cultures will consult infectious disease.  There is 800 cc of drainage since surgery.  Patient is stable he has been weaned off his pressors and will transfer back to the orthopedic floor.  Plan for repeat debridement possibly this weekend.

## 2019-07-10 NOTE — Progress Notes (Signed)
NAME:  Robert Sullivan, MRN:  [id number], DOB:  Dec 10, 1965, LOS: 1 ADMISSION DATE:  07/09/2019, CONSULTATION DATE: 12/30 REFERRING MD: Dr. Sharol Given, CHIEF COMPLAINT: Postoperative shock  Brief History   53 year old male with recent history of fall and subsequent gluteal abscess.  He has undergone IR drainage and several surgical I&D.  He has been on IV antibiotics for approximately 1 month.  Was discharged about a week ago and now comes back with worsening edema and drainage from prior surgical site.  Taken to the OR for incision and drainage.  Postoperatively requiring phenylephrine infusion.  History of present illness   53 year old male with past medical history as below, which is significant for diabetes and OSA not on CPAP.  He was initially admitted back in the beginning of November after a slip and fall in the bathroom.  Work-up at that time was concerning for left gluteal hematoma as result of the fall.  He was treated for a UTI and discharged home.  He then again presented to [facility] emergency department on 12/1 with complaints of left gluteal and leg edema and pain.  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  He initially underwent IR drainage, but large abscess persisted and he was taken to the OR for I&D x 2 with wound vac placed. VAC discontinued on 12/20 and he was discharged on 12/21 to home with IV antibiotics. After about one week at home he was having significant drainage from old surgical site and called the orthopedic office. He was referred to the hospital for repeat I&D. He became hypotensive during the case and was started on phenylephrine. Post-operatively he couldn't be liberated from pressor and was transferred to ICU.   Past Medical History   has a past medical history of Arthritis (05/13/2019), Diabetes mellitus without complication ([place]), Headache, Sleep apnea, and Sleep apnea.   Significant Hospital Events     12/30 admit for I&D number 3  Consults:  Orthopedics PCCM  Procedures:  PICC pre-hosp I&D 12/30 >   Significant Diagnostic Tests:    Micro Data:  COVID, FLU > Neg Surgical 12/30   Antimicrobials:  Ancef >>>  Interim history/subjective:  Awake and alert no complaints transferring to floor  Objective   Blood pressure (!) 112/59, pulse 95, temperature 98.4 F (36.9 C), temperature source Oral, resp. rate 18, height 6' 3"  (1.905 m), weight (!) 168.1 kg, SpO2 97 %.        Intake/Output Summary (Last 24 hours) at 07/10/2019 0750 Last data filed at 07/10/2019 0700 Gross per 24 hour  Intake 4461.5 ml  Output 1750 ml  Net 2711.5 ml   Filed Weights   07/09/19 1346 07/09/19 2125  Weight: (!) 165.6 kg (!) 168.1 kg    Examination: General: Morbidly obese male no acute distress HEENT: No JVD or lymphadenopathy is appreciated Neuro: Grossly intact no focal defect CV: Heart sounds are regular regular rate and rhythm hemodynamically stable PULM: Diminished in the bases  GI: soft, bsx4 active, obese Extremities: warm/dry,  edema left thigh dressing dry and intact skin: no rashes or lesions   Resolved Hospital Problem list     Assessment & Plan:   Shock: septic vs hypovolemic post-operative. Recent Labs    07/09/19 1744 07/10/19 0036  HGB 9.5* 9.7*   Resolved 07/10/2019 Post hydration transfusion Now off vasopressor support Transferring to floor  Left gluteal abscess s/p IR drain and I&D x 2 earlier this month.  Now I&D again 12/30. - Management per ortho - Ancef continue. Original stop date was 1/19, suspect this will be longer now - Consider ID consultation.  - Wound VAC  DM CBG (last 3)  Recent Labs    07/09/19 1815 07/09/19 2139 07/10/19 0738  GLUCAP 112* 117* 174*    Sign scale insulin protocol Home Lantus  OSA not on CPAP Monitor  Best practice:  Diet: per primary Pain/Anxiety/Delirium protocol (if indicated): Per primary VAP protocol  (if indicated): NA DVT prophylaxis: per primary GI prophylaxis: NA Glucose control: SSI, lantus Mobility: BR Code Status: FULL Family Communication: Per primary Disposition: Transfer to floor 07/10/2019 with pulmonary critical care signing off  Labs   CBC: Recent Labs  Lab 07/08/19 2042 07/09/19 1744 07/10/19 0036  WBC 7.9  --  12.0*  NEUTROABS 5.5  --   --   HGB 10.7* 9.5* 9.7*  HCT 34.7* 28.0* 29.2*  MCV 100.9*  --  96.7  PLT 142*  --  149*    Basic Metabolic Panel: Recent Labs  Lab 07/08/19 2042 07/09/19 1744 07/10/19 0036  NA 138 140 139  K 4.8 4.4 3.6  CL 107  --  108  CO2 21*  --  24  GLUCOSE 99  --  153*  BUN 13  --  8  CREATININE 0.52*  --  0.55*  CALCIUM 8.0*  --  7.9*   GFR: Estimated Creatinine Clearance: 180.1 mL/min (A) (by C-G formula based on SCr of 0.55 mg/dL (L)). Recent Labs  Lab 07/08/19 2042 07/10/19 0036  WBC 7.9 12.0*    Liver Function Tests: Recent Labs  Lab 07/10/19 0036  ALBUMIN 1.9*   No results for input(s): LIPASE, AMYLASE in the last 168 hours. No results for input(s): AMMONIA in the last 168 hours.  ABG    Component Value Date/Time   HCO3 27.6 07/09/2019 1744   TCO2 29 07/09/2019 1744   O2SAT 93.0 07/09/2019 1744     Coagulation Profile: No results for input(s): INR, PROTIME in the last 168 hours.  Cardiac Enzymes: No results for input(s): CKTOTAL, CKMB, CKMBINDEX, TROPONINI in the last 168 hours.  HbA1C: Hgb A1c MFr Bld  Date/Time Value Ref Range Status  07/10/2019 12:36 AM 5.5 4.8 - 5.6 % Final    Comment:    (NOTE) Pre diabetes:          5.7%-6.4% Diabetes:              >6.4% Glycemic control for   <7.0% adults with diabetes   05/13/2019 03:55 AM 11.0 (H) 4.8 - 5.6 % Final    Comment:    (NOTE) Pre diabetes:          5.7%-6.4% Diabetes:              >6.4% Glycemic control for   <7.0% adults with diabetes     CBG: Recent Labs  Lab 07/09/19 1346 07/09/19 1815 07/09/19 2139 07/10/19 0738    GLUCAP 147* 112* 117* 174*    App Level 3 time 30 min  Steve Layaan Mott ACNP Acute Care Nurse Practitioner [place] Please consult [facility] 07/10/2019, 7:51 AM

## 2019-07-10 NOTE — Progress Notes (Signed)
Patient ID: Robert Sullivan, male   DOB: 10/15/1965, 53 y.o.   MRN: [id number] Patient is status post excisional debridement left hip massive abscess.  Tissues were sent for cultures will consult infectious disease.  There is 800 cc of drainage since surgery.  Patient is stable he has been weaned off his pressors and will transfer back to the orthopedic floor.  Plan for repeat debridement possibly this weekend.

## 2019-07-10 NOTE — Progress Notes (Signed)
1630 Received pt from ICU via bed, A&O x4. Left hip wound vac dressing dry and intact. Denies pain at this time. Call light w/ in reach.

## 2019-07-10 NOTE — Progress Notes (Signed)
PHARMACY NOTE:  ANTIMICROBIAL RENAL DOSAGE ADJUSTMENT  Current antimicrobial regimen includes a mismatch between antimicrobial dosage and estimated renal function.  As per policy approved by the Pharmacy & Therapeutics and Medical Executive Committees, the antimicrobial dosage will be adjusted accordingly.  Current antimicrobial dosage:  Ancef 1gm IV Q8H  Indication: chronic left hip abscess s/p multiple drainage and I&D's  Renal Function:  Estimated Creatinine Clearance: 180.1 mL/min (A) (by C-G formula based on SCr of 0.55 mg/dL (L)). []      On intermittent HD, scheduled: []      On CRRT    Antimicrobial dosage has been changed to:  Ancef 2gm IV Q8H, especially with weight 168 kg   Whitney Hillegass D. Mina Marble, PharmD, BCPS, [place] 07/10/2019, 9:53 AM

## 2019-07-11 DIAGNOSIS — Z978 Presence of other specified devices: Secondary | ICD-10-CM

## 2019-07-11 LAB — CBC
HCT: 25.6 % — ABNORMAL LOW (ref 39.0–52.0)
Hemoglobin: 8.4 g/dL — ABNORMAL LOW (ref 13.0–17.0)
MCH: 32.1 pg (ref 26.0–34.0)
MCHC: 32.8 g/dL (ref 30.0–36.0)
MCV: 97.7 fL (ref 80.0–100.0)
Platelets: UNDETERMINED 10*3/uL (ref 150–400)
RBC: 2.62 MIL/uL — ABNORMAL LOW (ref 4.22–5.81)
RDW: 16.2 % — ABNORMAL HIGH (ref 11.5–15.5)
WBC: 5.2 10*3/uL (ref 4.0–10.5)
nRBC: 0 % (ref 0.0–0.2)

## 2019-07-11 LAB — BASIC METABOLIC PANEL
Anion gap: 6 (ref 5–15)
BUN: 10 mg/dL (ref 6–20)
CO2: 22 mmol/L (ref 22–32)
Calcium: 7.5 mg/dL — ABNORMAL LOW (ref 8.9–10.3)
Chloride: 107 mmol/L (ref 98–111)
Creatinine, Ser: 0.56 mg/dL — ABNORMAL LOW (ref 0.61–1.24)
GFR calc Af Amer: >60 mL/min (ref >60)
GFR calc non Af Amer: >60 mL/min (ref >60)
Glucose, Bld: 142 mg/dL — ABNORMAL HIGH (ref 70–99)
Potassium: 3.6 mmol/L (ref 3.5–5.1)
Sodium: 135 mmol/L (ref 135–145)

## 2019-07-11 LAB — C-REACTIVE PROTEIN: CRP: 4.6 mg/dL — ABNORMAL HIGH (ref <1.0)

## 2019-07-11 LAB — GLUCOSE, CAPILLARY
Glucose-Capillary: 126 mg/dL — ABNORMAL HIGH (ref 70–99)
Glucose-Capillary: 129 mg/dL — ABNORMAL HIGH (ref 70–99)
Glucose-Capillary: 140 mg/dL — ABNORMAL HIGH (ref 70–99)
Glucose-Capillary: 128 mg/dL — ABNORMAL HIGH (ref 70–99)

## 2019-07-11 LAB — SEDIMENTATION RATE: Sed Rate: 12 mm/hr (ref 0–16)

## 2019-07-11 MED ORDER — SODIUM CHLORIDE 0.9% FLUSH
10.0000 mL | Freq: Two times a day (BID) | INTRAVENOUS | Status: DC
  Administered 2019-07-13 – 2019-07-25 (×7): 10 mL

## 2019-07-11 MED ORDER — ENSURE PRE-SURGERY PO LIQD
296.0000 mL | Freq: Once | ORAL | Status: AC
  Administered 2019-07-11: 296 mL via ORAL
  Filled 2019-07-11: qty 296

## 2019-07-11 MED ORDER — POVIDONE-IODINE 10 % EX SWAB: 2.0000 "application " | Freq: Once | CUTANEOUS | Status: DC

## 2019-07-11 MED ORDER — SODIUM CHLORIDE 0.9% FLUSH
10.0000 mL | INTRAVENOUS | Status: DC | PRN
  Administered 2019-07-17 – 2019-07-21 (×2): 10 mL

## 2019-07-11 MED ORDER — DEXTROSE 5 % IV SOLN
3.0000 g | INTRAVENOUS | Status: DC
  Filled 2019-07-11: qty 3000

## 2019-07-11 MED ORDER — SODIUM CHLORIDE 0.9 % IV SOLN
2.0000 g | Freq: Three times a day (TID) | INTRAVENOUS | Status: DC
  Administered 2019-07-11 – 2019-07-25 (×40): 2 g via INTRAVENOUS
  Filled 2019-07-11 (×40): qty 2

## 2019-07-11 MED ORDER — CHLORHEXIDINE GLUCONATE 4 % EX LIQD
60.0000 mL | Freq: Once | CUTANEOUS | Status: AC
  Administered 2019-07-12: 4 via TOPICAL
  Filled 2019-07-11: qty 60

## 2019-07-11 NOTE — Progress Notes (Signed)
Subjective: No new complaints   Antibiotics:  Anti-infectives (From admission, onward)   Start     Dose/Rate Route Frequency Ordered Stop   07/10/19 1400  ceFAZolin (ANCEF) IVPB 2g/100 mL premix  Status:  Discontinued     2 g 200 mL/hr over 30 Minutes Intravenous Every 8 hours 07/10/19 0954 07/11/19 1241   07/10/19 0600  ceFAZolin (ANCEF) 3 g in dextrose 5 % 50 mL IVPB     3 g 100 mL/hr over 30 Minutes Intravenous On call to O.R. 07/09/19 1343 07/10/19 0630   07/09/19 2200  ceFAZolin (ANCEF) IVPB 1 g/50 mL premix  Status:  Discontinued     1 g 100 mL/hr over 30 Minutes Intravenous Every 8 hours 07/09/19 2104 07/10/19 0954      Medications: Scheduled Meds: . sodium chloride   Intravenous Once  . Chlorhexidine Gluconate Cloth  6 each Topical Daily  . insulin aspart  0-15 Units Subcutaneous TID WC  . insulin aspart  0-5 Units Subcutaneous QHS  . insulin glargine  16 Units Subcutaneous Daily  . Ensure Max Protein  11 oz Oral Daily  . sodium chloride flush  10-40 mL Intracatheter Q12H   Continuous Infusions: . sodium chloride 100 mL/hr at 07/11/19 1017  . phenylephrine (NEO-SYNEPHRINE) Adult infusion Stopped (07/10/19 0505)   PRN Meds:.acetaminophen, HYDROmorphone (DILAUDID) injection, metoCLOPramide **OR** metoCLOPramide (REGLAN) injection, ondansetron **OR** ondansetron (ZOFRAN) IV, oxyCODONE, sodium chloride flush    Objective: Weight change:   Intake/Output Summary (Last 24 hours) at 07/11/2019 1241 Last data filed at 07/11/2019 0300 Gross per 24 hour  Intake 344.05 ml  Output 1275 ml  Net -930.95 ml   Blood pressure 140/73, pulse 95, temperature 97.8 F (36.6 C), temperature source Oral, resp. rate 18, height 6' 3"  (1.905 m), weight (!) 168.1 kg, SpO2 97 %. Temp:  [97.8 F (36.6 C)-98.6 F (37 C)] 97.8 F (36.6 C) (01/01 0806) Pulse Rate:  [88-97] 95 (01/01 0806) Resp:  [14-18] 18 (12/31 1639) BP: (115-140)/(58-76) 140/73 (01/01 0806) SpO2:  [97 %-100  %] 97 % (01/01 0806)  Physical Exam: General: Alert and awake, oriented x3, not in any acute distress. HEENT: anicteric sclera, EOMI CVS regular rate, normal  Chest: , no wheezing, no respiratory distress Abdomen: soft non-distended,  Extremities: no edema or deformity noted bilaterally Skin: no rashes, drain in place Neuro: nonfocal  CBC:    BMET Recent Labs    07/10/19 0036 07/11/19 0331  NA 139 135  K 3.6 3.6  CL 108 107  CO2 24 22  GLUCOSE 153* 142*  BUN 8 10  CREATININE 0.55* 0.56*  CALCIUM 7.9* 7.5*     Liver Panel  Recent Labs    07/10/19 0036  ALBUMIN 1.9*       Sedimentation Rate Recent Labs    07/11/19 0331  ESRSEDRATE 12   C-Reactive Protein Recent Labs    07/08/19 2043 07/11/19 0331  CRP 2.6* 4.6*    Micro Results: Recent Results (from the past 720 hour(s))  Culture, blood (routine x 2)     Status: None   Collection Time: 06/11/19  4:35 PM   Specimen: BLOOD LEFT HAND  Result Value Ref Range Status   Specimen Description BLOOD LEFT HAND  Final   Special Requests   Final    BOTTLES DRAWN AEROBIC AND ANAEROBIC Blood Culture adequate volume   Culture   Final    NO GROWTH 5 DAYS Performed at [facility],  [address], [place], [place] [address]    Report Status 06/16/2019 FINAL  Final  Culture, blood (routine x 2)     Status: None   Collection Time: 06/11/19  4:40 PM   Specimen: BLOOD RIGHT HAND  Result Value Ref Range Status   Specimen Description BLOOD RIGHT HAND  Final   Special Requests   Final    BOTTLES DRAWN AEROBIC ONLY Blood Culture adequate volume   Culture   Final    NO GROWTH 5 DAYS Performed at [facility], [place] [address] [place], [place] [address]    Report Status 06/16/2019 FINAL  Final  Aerobic/Anaerobic Culture (surgical/deep wound)     Status: None   Collection Time: 06/17/19 11:27 AM   Specimen: Abscess  Result Value Ref Range Status   Specimen Description ABSCESS LEFT GLUTEAL  Final    Special Requests Normal  Final   Gram Stain   Final    ABUNDANT WBC PRESENT,BOTH PMN AND MONONUCLEAR ABUNDANT GRAM POSITIVE COCCI    Culture   Final    MODERATE STAPHYLOCOCCUS AUREUS NO ANAEROBES ISOLATED Performed at [facility], [place] [address] [place], [place] [address]    Report Status 06/22/2019 FINAL  Final   Organism ID, Bacteria STAPHYLOCOCCUS AUREUS  Final      Susceptibility   Staphylococcus aureus - MIC*    CIPROFLOXACIN <=0.5 SENSITIVE Sensitive     ERYTHROMYCIN <=0.25 SENSITIVE Sensitive     GENTAMICIN <=0.5 SENSITIVE Sensitive     OXACILLIN 0.5 SENSITIVE Sensitive     TETRACYCLINE >=16 RESISTANT Resistant     VANCOMYCIN 1 SENSITIVE Sensitive     TRIMETH/SULFA <=10 SENSITIVE Sensitive     CLINDAMYCIN <=0.25 SENSITIVE Sensitive     RIFAMPIN <=0.5 SENSITIVE Sensitive     Inducible Clindamycin NEGATIVE Sensitive     * MODERATE STAPHYLOCOCCUS AUREUS  Respiratory Panel by RT PCR (Flu A&B, Covid) - Nasopharyngeal Swab     Status: None   Collection Time: 07/09/19  1:36 PM   Specimen: Nasopharyngeal Swab  Result Value Ref Range Status   SARS Coronavirus 2 by RT PCR NEGATIVE NEGATIVE Final    Comment: (NOTE) SARS-CoV-2 target nucleic acids are NOT DETECTED. The SARS-CoV-2 RNA is generally detectable in upper respiratoy specimens during the acute phase of infection. The lowest concentration of SARS-CoV-2 viral copies this assay can detect is 131 copies/mL. A negative result does not preclude SARS-Cov-2 infection and should not be used as the sole basis for treatment or other patient management decisions. A negative result may occur with  improper specimen collection/handling, submission of specimen other than nasopharyngeal swab, presence of viral mutation(s) within the areas targeted by this assay, and inadequate number of viral copies (<131 copies/mL). A negative result must be combined with clinical observations, patient history, and epidemiological information.  The expected result is Negative. Fact Sheet for Patients:  PinkCheek.be Fact Sheet for Healthcare Providers:  GravelBags.it This test is not yet ap proved or cleared by the [place] FDA and  has been authorized for detection and/or diagnosis of SARS-CoV-2 by FDA under an Emergency Use Authorization (EUA). This EUA will remain  in effect (meaning this test can be used) for the duration of the COVID-19 declaration under Section 564(b)(1) of the Act, 21 U.S.C. section 360bbb-3(b)(1), unless the authorization is terminated or revoked sooner.    Influenza A by PCR NEGATIVE NEGATIVE Final   Influenza B by PCR NEGATIVE NEGATIVE Final    Comment: (NOTE) The  Xpert Xpress SARS-CoV-2/FLU/RSV assay is intended as an aid in  the diagnosis of influenza from Nasopharyngeal swab specimens and  should not be used as a sole basis for treatment. Nasal washings and  aspirates are unacceptable for Xpert Xpress SARS-CoV-2/FLU/RSV  testing. Fact Sheet for Patients: PinkCheek.be Fact Sheet for Healthcare Providers: GravelBags.it This test is not yet approved or cleared by the [place] FDA and  has been authorized for detection and/or diagnosis of SARS-CoV-2 by  FDA under an Emergency Use Authorization (EUA). This EUA will remain  in effect (meaning this test can be used) for the duration of the  Covid-19 declaration under Section 564(b)(1) of the Act, 21  U.S.C. section 360bbb-3(b)(1), unless the authorization is  terminated or revoked. Performed at [facility], [place] [address] [place], [place] [address]   Aerobic/Anaerobic Culture (surgical/deep wound)     Status: None (Preliminary result)   Collection Time: 07/09/19  5:29 PM   Specimen: Soft Tissue, Other  Result Value Ref Range Status   Specimen Description TISSUE LEFT HIP  Final   Special Requests NONE  Final   Gram  Stain   Final    RARE WBC PRESENT, PREDOMINANTLY PMN NO ORGANISMS SEEN Performed at [facility], [place] [address] [place], [place] [address]    Culture   Final    RARE GRAM NEGATIVE RODS NO ANAEROBES ISOLATED; CULTURE IN PROGRESS FOR 5 DAYS    Report Status PENDING  Incomplete  MRSA PCR Screening     Status: None   Collection Time: 07/09/19  9:24 PM   Specimen: Nasal Mucosa; Nasopharyngeal  Result Value Ref Range Status   MRSA by PCR NEGATIVE NEGATIVE Final    Comment:        The GeneXpert MRSA Assay (FDA approved for NASAL specimens only), is one component of a comprehensive MRSA colonization surveillance program. It is not intended to diagnose MRSA infection nor to guide or monitor treatment for MRSA infections. Performed at [facility], [place] [address], [place], [place] [address]     Studies/Results: MR HIP LEFT W WO CONTRAST  Result Date: 07/10/2019 CLINICAL DATA:  Follow-up left gluteal abscess. EXAM: MRI OF THE LEFT HIP WITHOUT AND WITH CONTRAST TECHNIQUE: Multiplanar, multisequence MR imaging was performed both before and after administration of intravenous contrast. CONTRAST:  69m GADAVIST GADOBUTROL 1 MMOL/ML IV SOLN COMPARISON:  MRI 06/13/2019 and CT scan 06/20/2019 FINDINGS: Examination is quite limited due to body habitus and motion. Both hips are normally located. No findings suspicious for septic arthritis or osteomyelitis involving the hips. The pubic symphysis and SI joints are intact. No evidence of septic arthritis. The patient had a huge left gluteal abscess on the prior MRI from 06/13/2019. This is vastly improved. There are a few small residual abscesses measuring less than 3 cm. Small gluteal hematomas are also noted. Persistent surrounding myofasciitis. There is also a large open wound in the lateral buttock region. Diffuse myositis involving the right gluteal muscles and right lateral thigh muscles. There is also diffuse myositis involving the  left adductor muscles and the left thigh muscles. No definite findings for new areas of pyomyositis. Moderate free pelvic fluid is noted. IMPRESSION: 1. Very limited examination due to body habitus and motion. 2. Vastly improved left gluteal abscess. There are few small sub 3 cm abscesses and a few small hematomas. 3. Persistent diffuse myofasciitis involving the hip and pelvic musculature bilaterally but no new areas of pyomyositis. 4. No definite  MR findings for septic arthritis or osteomyelitis. 5. Moderate free pelvic fluid noted. Electronically Signed   By: Marijo Sanes M.D.   On: 07/10/2019 15:58      Assessment/Plan:  INTERVAL HISTORY: Cultures this time are showing a gram-negative rod   Active Problems:   Abscess of left hip   Blister of hip with infection    Robert Sullivan is a 54 y.o. male with  Complicated MSSA bacteremia with large left hip abscess that is required several surgeries including most recent one performed by Dr. Sharol Given on July 09, 2019.  He has been on IV cefazolin 2 g IV every 8 hours with original plan of completing therapy on January 20.  Had been some prior anxiety about potential involvement of the hip joint and self based on imaging.  MRI that I ordered yesterday did not show conclusive evidence of involvement of the hip joint or of osteomyelitis did show some evidence of myositis.  Intraoperative cultures this time from the abscess are apparently growing a gram-negative rod  I am going to switch him over to cefepime  He is going back to the OR again tomorrow.  Dr. Linus Salmons is available this weekend for questions and will follow up the culture data.   LOS: 2 days   Alcide Evener 07/11/2019, 12:41 PM

## 2019-07-11 NOTE — Evaluation (Signed)
Occupational Therapy Evaluation Patient Details Name: Robert Sullivan MRN: [id number] DOB: 1965/08/19 Today's Date: 07/11/2019    History of Present Illness 54 year old male with recent history of fall and subsequent gluteal abscess.  He has undergone IR drainage and several surgical I&D.  He has been on IV antibiotics for approximately 1 month.  Was discharged about a week ago and now comes back with worsening edema and drainage from prior surgical site. PMH including but not limited to DM2, OA, septic shock, obesity, and arthritis.   Clinical Impression   Pt with decline in function and safety with ADLs and ADL mobility with impaired balance and endurance. Pt lives at home with his wife and was independent with ADLs/selfcare and used crutches for mobility. Pt currently requires set up with UB ADLs, min guard A with grooming standing, max - total A with LB ADL and min guard A with mobility using RW. Pt would benefit from acute OT services to address impairments to maximize level of function and safety    Follow Up Recommendations  Home health OT    Equipment Recommendations  Tub/shower seat;Other (comment);3 in 1 bedside commode(reacher)    Recommendations for Other Services       Precautions / Restrictions Precautions Precautions: Fall Precaution Comments: wound vac, drain Restrictions Weight Bearing Restrictions: Yes LLE Weight Bearing: Weight bearing as tolerated      Mobility Bed Mobility Overal bed mobility: Needs Assistance Bed Mobility: Supine to Sit     Supine to sit: Min guard;HOB elevated     General bed mobility comments: increased time; heavy use of bed rails  Transfers Overall transfer level: Needs assistance Equipment used: Rolling walker (2 wheeled) Transfers: Sit to/from Stand Sit to Stand: Min guard;From elevated surface Stand pivot transfers: Min guard            Balance Overall balance assessment: Needs assistance Sitting-balance support: No upper  extremity supported;Feet supported Sitting balance-Leahy Scale: Good     Standing balance support: Bilateral upper extremity supported;During functional activity Standing balance-Leahy Scale: Poor                             ADL either performed or assessed with clinical judgement   ADL Overall ADL's : Needs assistance/impaired Eating/Feeding: Independent;Sitting   Grooming: Wash/dry hands;Wash/dry face;Min guard;Standing   Upper Body Bathing: Set up;Sitting   Lower Body Bathing: Maximal assistance   Upper Body Dressing : Set up;Sitting   Lower Body Dressing: Total assistance   Toilet Transfer: Min guard;Stand-pivot;Cueing for Office manager Details (indicate cue type and reason): simulated to recliner Toileting- Clothing Manipulation and Hygiene: Maximal assistance       Functional mobility during ADLs: Min guard;Rolling walker;Cueing for safety       Vision Baseline Vision/History: No visual deficits Patient Visual Report: No change from baseline       Perception     Praxis      Pertinent Vitals/Pain Pain Assessment: 0-10 Pain Score: 6  Pain Location: L buttocks Pain Descriptors / Indicators: Burning Pain Intervention(s): Limited activity within patient's tolerance;Monitored during session;Repositioned     Hand Dominance Right   Extremity/Trunk Assessment Upper Extremity Assessment Upper Extremity Assessment: Overall WFL for tasks assessed   Lower Extremity Assessment Lower Extremity Assessment: Defer to PT evaluation       Communication Communication Communication: No difficulties   Cognition Arousal/Alertness: Awake/alert Behavior During Therapy: WFL for tasks assessed/performed Overall Cognitive Status: Within Functional Limits for  tasks assessed                                     General Comments       Exercises     Shoulder Instructions      Home Living Family/patient expects to be discharged to::  Private residence Living Arrangements: Spouse/significant other;Children Available Help at Discharge: Family;Available 24 hours/day Type of Home: House Home Access: Stairs to enter [employer] of Steps: 4 Entrance Stairs-Rails: None Home Layout: Two level;Bed/bath upstairs Alternate Level Stairs-Number of Steps: flight of stairs Alternate Level Stairs-Rails: Right     Bathroom Toilet: Standard Bathroom Accessibility: Yes   Home Equipment: Walker - 2 wheels;Crutches          Prior Functioning/Environment Level of Independence: Independent with assistive device(s)        Comments: pt reports he was using crutches to negotiate stairs at home        OT Problem List: Impaired balance (sitting and/or standing);Pain;Decreased activity tolerance;Decreased knowledge of use of DME or AE      OT Treatment/Interventions: Self-care/ADL training;DME and/or AE instruction;Therapeutic activities;Balance training;Patient/family education    OT Goals(Current goals can be found in the care plan section) Acute Rehab OT Goals Patient Stated Goal: home OT Goal Formulation: With patient Time For Goal Achievement: 07/25/19 Potential to Achieve Goals: Good ADL Goals Pt Will Perform Grooming: with set-up;with supervision;standing Pt Will Perform Lower Body Bathing: with mod assist;with adaptive equipment;sitting/lateral leans;sit to/from stand Pt Will Perform Lower Body Dressing: with mod assist;sitting/lateral leans;sit to/from stand;with adaptive equipment Pt Will Transfer to Toilet: with supervision;ambulating;stand pivot transfer Pt Will Perform Toileting - Clothing Manipulation and hygiene: with mod assist;sit to/from stand  OT Frequency: Min 2X/week   Barriers to D/C:    no barriers       Co-evaluation              AM-PAC OT "6 Clicks" Daily Activity     Outcome Measure Help from another person eating meals?: None Help from another person taking care of personal  grooming?: A Little Help from another person toileting, which includes using toliet, bedpan, or urinal?: A Lot Help from another person bathing (including washing, rinsing, drying)?: A Lot Help from another person to put on and taking off regular upper body clothing?: None Help from another person to put on and taking off regular lower body clothing?: Total 6 Click Score: 16   End of Session Equipment Utilized During Treatment: Rolling walker;Gait belt  Activity Tolerance: Patient tolerated treatment well Patient left: in chair;with call bell/phone within reach;with nursing/sitter in room;with chair alarm set  OT Visit Diagnosis: Unsteadiness on feet (R26.81);Other abnormalities of gait and mobility (R26.89);Pain Pain - Right/Left: Left Pain - part of body: Leg(buttocks)                Time: 1115-1150 OT Time Calculation (min): 35 min Charges:  OT General Charges $OT Visit: 1 Visit OT Evaluation $OT Eval Moderate Complexity: 1 Mod OT Treatments $Self Care/Home Management : 8-22 mins    Emmit Alexanders [facility] 07/11/2019, 1:03 PM

## 2019-07-11 NOTE — Progress Notes (Signed)
Patient ID: Robert Sullivan, male   DOB: 1965-11-30, 54 y.o.   MRN: [id number] Patient is status post irrigation debridement of a large abscess in the left hip.  Patient has had 500 cc in the wound VAC canister daily.  150 cc this morning.  Culture sensitivities are pending.  Plan for repeat irrigation debridement tomorrow Saturday morning n.p.o. after midnight.

## 2019-07-11 NOTE — Progress Notes (Signed)
Pharmacy Antibiotic Note  Robert Sullivan is a 54 y.o. male admitted on 89/37/3428 with  complicated MSSA bacteremia with large left hip abscess that is required several surgeries including most recent one done 07/09/19.  He has been on IV cefazolin 2 g IV q8h.    Intraoperative left hip tissue cultures growing a gram-negative rod.  ID is switching antibiotic to Cefepime and has consulted pharmacy for Cefepime dosing for bactermia.  Plan: Cefepime 2 g IV q8 hours Monitor clinical status, renal function and culture results daily.    Height: 6' 3"  (190.5 cm) Weight: (!) 370 lb 9.5 oz (168.1 kg) IBW/kg (Calculated) : 84.5  Temp (24hrs), Avg:98.2 F (36.8 C), Min:97.8 F (36.6 C), Max:98.6 F (37 C)  Recent Labs  Lab 07/08/19 2042 07/10/19 0036 07/11/19 0331  WBC 7.9 12.0* 5.2  CREATININE 0.52* 0.55* 0.56*    Estimated Creatinine Clearance: 180.1 mL/min (A) (by C-G formula based on SCr of 0.56 mg/dL (L)).    Allergies  Allergen Reactions  . Sulfa Antibiotics Rash    Total body rash    Antimicrobials this admission: Cefazolin on  PTA >> 07/11/19 Cefepime 07/11/19>>  Dose adjustments this admission:   Microbiology results: 12/30 MRSA / covid Corie Chiquito- negative 12/30 left hip tissue cx -  GNR   Thank you for allowing pharmacy to be a part of this patient's care. Nicole Cella, RPh Clinical Pharmacist  Please check AMION for all [employer] phone numbers After 10:00 PM, call [place] [phone] 07/11/2019 1:14 PM

## 2019-07-11 NOTE — Plan of Care (Signed)

## 2019-07-12 ENCOUNTER — Encounter (HOSPITAL_COMMUNITY)
Admission: RE | Disposition: A | Discharge status: Home / Self Care | Payer: Self-pay | Source: Intra-hospital | Attending: Orthopedic Surgery

## 2019-07-12 ENCOUNTER — Inpatient Hospital Stay (HOSPITAL_COMMUNITY): Payer: Self-pay | Admitting: Anesthesiology

## 2019-07-12 HISTORY — PX: INCISION AND DRAINAGE OF WOUND: SHX1803

## 2019-07-12 LAB — BASIC METABOLIC PANEL
Anion gap: 8 (ref 5–15)
BUN: 11 mg/dL (ref 6–20)
CO2: 21 mmol/L — ABNORMAL LOW (ref 22–32)
Calcium: 7.5 mg/dL — ABNORMAL LOW (ref 8.9–10.3)
Chloride: 105 mmol/L (ref 98–111)
Creatinine, Ser: 0.47 mg/dL — ABNORMAL LOW (ref 0.61–1.24)
GFR calc Af Amer: >60 mL/min (ref >60)
GFR calc non Af Amer: >60 mL/min (ref >60)
Glucose, Bld: 128 mg/dL — ABNORMAL HIGH (ref 70–99)
Potassium: 3.7 mmol/L (ref 3.5–5.1)
Sodium: 134 mmol/L — ABNORMAL LOW (ref 135–145)

## 2019-07-12 LAB — GLUCOSE, CAPILLARY
Glucose-Capillary: 116 mg/dL — ABNORMAL HIGH (ref 70–99)
Glucose-Capillary: 134 mg/dL — ABNORMAL HIGH (ref 70–99)
Glucose-Capillary: 139 mg/dL — ABNORMAL HIGH (ref 70–99)
Glucose-Capillary: 144 mg/dL — ABNORMAL HIGH (ref 70–99)
Glucose-Capillary: 147 mg/dL — ABNORMAL HIGH (ref 70–99)
Glucose-Capillary: 159 mg/dL — ABNORMAL HIGH (ref 70–99)
Glucose-Capillary: 176 mg/dL — ABNORMAL HIGH (ref 70–99)

## 2019-07-12 LAB — PREPARE RBC (CROSSMATCH)

## 2019-07-12 SURGERY — IRRIGATION AND DEBRIDEMENT WOUND
Anesthesia: General | Site: Hip | Laterality: Left

## 2019-07-12 MED ORDER — METHOCARBAMOL 500 MG PO TABS
ORAL_TABLET | ORAL | Status: AC
  Filled 2019-07-12: qty 1

## 2019-07-12 MED ORDER — ONDANSETRON HCL 4 MG/2ML IJ SOLN
INTRAMUSCULAR | Status: DC | PRN
  Administered 2019-07-12: 4 mg via INTRAVENOUS

## 2019-07-12 MED ORDER — ONDANSETRON HCL 4 MG/2ML IJ SOLN
INTRAMUSCULAR | Status: AC
  Filled 2019-07-12: qty 2

## 2019-07-12 MED ORDER — MIDAZOLAM HCL 5 MG/5ML IJ SOLN
INTRAMUSCULAR | Status: DC | PRN
  Administered 2019-07-12: 2 mg via INTRAVENOUS

## 2019-07-12 MED ORDER — PHENYLEPHRINE 40 MCG/ML (10ML) SYRINGE FOR IV PUSH (FOR BLOOD PRESSURE SUPPORT)
PREFILLED_SYRINGE | INTRAVENOUS | Status: DC | PRN
  Administered 2019-07-12: 120 ug via INTRAVENOUS

## 2019-07-12 MED ORDER — SUCCINYLCHOLINE CHLORIDE 200 MG/10ML IV SOSY
PREFILLED_SYRINGE | INTRAVENOUS | Status: DC | PRN
  Administered 2019-07-12: 140 mg via INTRAVENOUS

## 2019-07-12 MED ORDER — LACTATED RINGERS IV SOLN: INTRAVENOUS | Status: DC

## 2019-07-12 MED ORDER — METOCLOPRAMIDE HCL 5 MG PO TABS
5.0000 mg | ORAL_TABLET | Freq: Three times a day (TID) | ORAL | Status: DC | PRN
  Administered 2019-07-14 – 2019-07-16 (×3): 10 mg via ORAL
  Filled 2019-07-12 (×3): qty 2

## 2019-07-12 MED ORDER — SODIUM CHLORIDE 0.9 % IV SOLN: 10.0000 mL/h | Freq: Once | INTRAVENOUS | Status: DC

## 2019-07-12 MED ORDER — MIDAZOLAM HCL 2 MG/2ML IJ SOLN
INTRAMUSCULAR | Status: AC
  Filled 2019-07-12: qty 2

## 2019-07-12 MED ORDER — FENTANYL CITRATE (PF) 100 MCG/2ML IJ SOLN: 25.0000 ug | INTRAMUSCULAR | Status: DC | PRN

## 2019-07-12 MED ORDER — PHENYLEPHRINE HCL-NACL 10-0.9 MG/250ML-% IV SOLN
INTRAVENOUS | Status: DC | PRN
  Administered 2019-07-12: 25 ug/min via INTRAVENOUS

## 2019-07-12 MED ORDER — PROPOFOL 10 MG/ML IV BOLUS
INTRAVENOUS | Status: DC | PRN
  Administered 2019-07-12: 200 mg via INTRAVENOUS

## 2019-07-12 MED ORDER — BISACODYL 10 MG RE SUPP: 10.0000 mg | Freq: Every day | RECTAL | Status: DC | PRN

## 2019-07-12 MED ORDER — OXYCODONE HCL 5 MG/5ML PO SOLN: 5.0000 mg | Freq: Once | ORAL | Status: DC | PRN

## 2019-07-12 MED ORDER — METOCLOPRAMIDE HCL 5 MG/ML IJ SOLN
5.0000 mg | Freq: Three times a day (TID) | INTRAMUSCULAR | Status: DC | PRN
  Administered 2019-07-17: 10 mg via INTRAVENOUS
  Filled 2019-07-12: qty 2

## 2019-07-12 MED ORDER — ONDANSETRON HCL 4 MG/2ML IJ SOLN
4.0000 mg | Freq: Four times a day (QID) | INTRAMUSCULAR | Status: DC | PRN
  Administered 2019-07-14 – 2019-07-23 (×4): 4 mg via INTRAVENOUS
  Filled 2019-07-12 (×4): qty 2

## 2019-07-12 MED ORDER — FENTANYL CITRATE (PF) 250 MCG/5ML IJ SOLN
INTRAMUSCULAR | Status: AC
  Filled 2019-07-12: qty 5

## 2019-07-12 MED ORDER — ONDANSETRON HCL 4 MG/2ML IJ SOLN: 4.0000 mg | Freq: Four times a day (QID) | INTRAMUSCULAR | Status: DC | PRN

## 2019-07-12 MED ORDER — SODIUM CHLORIDE 0.9 % IV SOLN: INTRAVENOUS | Status: DC

## 2019-07-12 MED ORDER — OXYCODONE HCL 5 MG PO TABS: 5.0000 mg | ORAL_TABLET | Freq: Once | ORAL | Status: DC | PRN

## 2019-07-12 MED ORDER — MAGNESIUM CITRATE PO SOLN
1.0000 | Freq: Once | ORAL | Status: AC | PRN
  Administered 2019-07-15: 1 via ORAL
  Filled 2019-07-12: qty 296

## 2019-07-12 MED ORDER — OXYCODONE HCL 5 MG PO TABS
ORAL_TABLET | ORAL | Status: AC
  Filled 2019-07-12: qty 2

## 2019-07-12 MED ORDER — POLYETHYLENE GLYCOL 3350 17 G PO PACK: 17.0000 g | PACK | Freq: Every day | ORAL | Status: DC | PRN

## 2019-07-12 MED ORDER — SODIUM CHLORIDE 0.9 % IR SOLN
Status: DC | PRN
  Administered 2019-07-12: 3000 mL

## 2019-07-12 MED ORDER — FENTANYL CITRATE (PF) 250 MCG/5ML IJ SOLN
INTRAMUSCULAR | Status: DC | PRN
  Administered 2019-07-12: 100 ug via INTRAVENOUS
  Administered 2019-07-12: 50 ug via INTRAVENOUS

## 2019-07-12 MED ORDER — PROPOFOL 10 MG/ML IV BOLUS
INTRAVENOUS | Status: AC
  Filled 2019-07-12: qty 20

## 2019-07-12 MED ORDER — 0.9 % SODIUM CHLORIDE (POUR BTL) OPTIME
TOPICAL | Status: DC | PRN
  Administered 2019-07-12: 1000 mL

## 2019-07-12 MED ORDER — ALBUMIN HUMAN 5 % IV SOLN: INTRAVENOUS | Status: DC | PRN

## 2019-07-12 MED ORDER — METHOCARBAMOL 1000 MG/10ML IJ SOLN
500.0000 mg | Freq: Four times a day (QID) | INTRAVENOUS | Status: DC | PRN
  Filled 2019-07-12: qty 5

## 2019-07-12 MED ORDER — METHOCARBAMOL 500 MG PO TABS
500.0000 mg | ORAL_TABLET | Freq: Four times a day (QID) | ORAL | Status: DC | PRN
  Administered 2019-07-12 – 2019-07-24 (×19): 500 mg via ORAL
  Filled 2019-07-12 (×18): qty 1

## 2019-07-12 MED ORDER — ONDANSETRON HCL 4 MG PO TABS
4.0000 mg | ORAL_TABLET | Freq: Four times a day (QID) | ORAL | Status: DC | PRN
  Administered 2019-07-12 – 2019-07-17 (×4): 4 mg via ORAL
  Filled 2019-07-12 (×4): qty 1

## 2019-07-12 MED ORDER — DOCUSATE SODIUM 100 MG PO CAPS
100.0000 mg | ORAL_CAPSULE | Freq: Two times a day (BID) | ORAL | Status: DC
  Filled 2019-07-12 (×14): qty 1

## 2019-07-12 SURGICAL SUPPLY — 50 items
BENZOIN TINCTURE PRP APPL 2/3 (GAUZE/BANDAGES/DRESSINGS) ×4 IMPLANT
BNDG COHESIVE 6X5 TAN STRL LF (GAUZE/BANDAGES/DRESSINGS) ×1 IMPLANT
CANISTER WOUNDNEG PRESSURE 500 (CANNISTER) ×1 IMPLANT
COVER SURGICAL LIGHT HANDLE (MISCELLANEOUS) ×3 IMPLANT
COVER WAND RF STERILE (DRAPES) ×2 IMPLANT
DRAPE IMP U-DRAPE 54X76 (DRAPES) ×2 IMPLANT
DRAPE INCISE IOBAN 66X45 STRL (DRAPES) ×2 IMPLANT
DRAPE ORTHO SPLIT 77X108 STRL (DRAPES) ×2
DRAPE SURG ORHT 6 SPLT 77X108 (DRAPES) ×2 IMPLANT
DRAPE U-SHAPE 47X51 STRL (DRAPES) ×3 IMPLANT
DRESSING PREVENA PLUS CUSTOM (GAUZE/BANDAGES/DRESSINGS) IMPLANT
DRSG ADAPTIC 3X8 NADH LF (GAUZE/BANDAGES/DRESSINGS) ×1 IMPLANT
DRSG MEPILEX BORDER 4X8 (GAUZE/BANDAGES/DRESSINGS) ×1 IMPLANT
DRSG PAD ABDOMINAL 8X10 ST (GAUZE/BANDAGES/DRESSINGS) ×1 IMPLANT
DRSG PREVENA PLUS CUSTOM (GAUZE/BANDAGES/DRESSINGS) ×2
DURAPREP 26ML APPLICATOR (WOUND CARE) ×2 IMPLANT
ELECT CAUTERY BLADE 6.4 (BLADE) IMPLANT
ELECT REM PT RETURN 9FT ADLT (ELECTROSURGICAL) ×2
ELECTRODE REM PT RTRN 9FT ADLT (ELECTROSURGICAL) IMPLANT
GAUZE SPONGE 4X4 12PLY STRL (GAUZE/BANDAGES/DRESSINGS) ×1 IMPLANT
GLOVE BIOGEL PI IND STRL 9 (GLOVE) ×1 IMPLANT
GLOVE BIOGEL PI INDICATOR 9 (GLOVE) ×1
GLOVE SURG ORTHO 9.0 STRL STRW (GLOVE) ×3 IMPLANT
GOWN STRL REUS W/ TWL XL LVL3 (GOWN DISPOSABLE) ×2 IMPLANT
GOWN STRL REUS W/TWL XL LVL3 (GOWN DISPOSABLE) ×2
HANDPIECE INTERPULSE COAX TIP (DISPOSABLE) ×1
KIT BASIN OR (CUSTOM PROCEDURE TRAY) ×2 IMPLANT
KIT TURNOVER KIT B (KITS) ×2 IMPLANT
MANIFOLD NEPTUNE II (INSTRUMENTS) ×2 IMPLANT
NS IRRIG 1000ML POUR BTL (IV SOLUTION) ×2 IMPLANT
PACK GENERAL/GYN (CUSTOM PROCEDURE TRAY) ×1 IMPLANT
PACK TOTAL JOINT (CUSTOM PROCEDURE TRAY) ×1 IMPLANT
PACK UNIVERSAL I (CUSTOM PROCEDURE TRAY) ×2 IMPLANT
PAD ARMBOARD 7.5X6 YLW CONV (MISCELLANEOUS) ×5 IMPLANT
PREVENA RESTOR AXIOFORM 29X28 (GAUZE/BANDAGES/DRESSINGS) ×1 IMPLANT
SET HNDPC FAN SPRY TIP SCT (DISPOSABLE) IMPLANT
SPONGE LAP 18X18 RF (DISPOSABLE) ×1 IMPLANT
STAPLER VISISTAT 35W (STAPLE) ×1 IMPLANT
STOCKINETTE IMPERVIOUS LG (DRAPES) ×1 IMPLANT
SUT ETHIBOND NAB CT1 #1 30IN (SUTURE) ×1 IMPLANT
SUT ETHILON 2 0 PSLX (SUTURE) ×4 IMPLANT
SUT VIC AB 1 CTX 36 (SUTURE)
SUT VIC AB 1 CTX36XBRD ANBCTR (SUTURE) ×1 IMPLANT
SUT VIC AB 2-0 CT1 27 (SUTURE)
SUT VIC AB 2-0 CT1 TAPERPNT 27 (SUTURE) ×1 IMPLANT
SWAB CULTURE ESWAB REG 1ML (MISCELLANEOUS) IMPLANT
TOWEL GREEN STERILE (TOWEL DISPOSABLE) ×2 IMPLANT
TOWEL GREEN STERILE FF (TOWEL DISPOSABLE) ×2 IMPLANT
UNDERPAD 30X30 (UNDERPADS AND DIAPERS) ×1 IMPLANT
WATER STERILE IRR 1000ML POUR (IV SOLUTION) ×2 IMPLANT

## 2019-07-12 NOTE — Interval H&P Note (Signed)
History and Physical Interval Note:  07/12/2019 7:48 AM  Robert Sullivan  has presented today for surgery, with the diagnosis of abscess left hip.  The various methods of treatment have been discussed with the patient and family. After consideration of risks, benefits and other options for treatment, the patient has consented to  Procedure(s): LEFT HIP DEBRIDEMENT (Left) as a surgical intervention.  The patient's history has been reviewed, patient examined, no change in status, stable for surgery.  I have reviewed the patient's chart and labs.  Questions were answered to the patient's satisfaction.     Newt Minion

## 2019-07-12 NOTE — Anesthesia Preprocedure Evaluation (Signed)
Anesthesia Evaluation  Patient identified by MRN, date of birth, ID band Patient awake    Reviewed: Allergy & Precautions, H&P , NPO status , Patient's Chart, lab work & pertinent test results  Airway Mallampati: II   Neck ROM: full    Dental   Pulmonary sleep apnea ,    breath sounds clear to auscultation       Cardiovascular negative cardio ROS   Rhythm:regular Rate:Normal     Neuro/Psych  Headaches,    GI/Hepatic   Endo/Other  diabetes, Type 2Morbid obesity  Renal/GU      Musculoskeletal  (+) Arthritis ,   Abdominal   Peds  Hematology  (+) anemia ,   Anesthesia Other Findings   Reproductive/Obstetrics                             Anesthesia Physical Anesthesia Plan  ASA: III  Anesthesia Plan: General   Post-op Pain Management:    Induction: Intravenous  PONV Risk Score and Plan: 2 and Ondansetron, Dexamethasone, Midazolam and Treatment may vary due to age or medical condition  Airway Management Planned: Oral ETT  Additional Equipment:   Intra-op Plan:   Post-operative Plan: Extubation in OR  Informed Consent: I have reviewed the patients History and Physical, chart, labs and discussed the procedure including the risks, benefits and alternatives for the proposed anesthesia with the patient or authorized representative who has indicated his/her understanding and acceptance.       Plan Discussed with: CRNA, Anesthesiologist and Surgeon  Anesthesia Plan Comments:         Anesthesia Quick Evaluation

## 2019-07-12 NOTE — Plan of Care (Signed)
  Problem: Education: Goal: Knowledge of General Education information will improve Description: Including pain rating scale, medication(s)/side effects and non-pharmacologic comfort measures Outcome: Progressing   Problem: Health Behavior/Discharge Planning: Goal: Ability to manage health-related needs will improve Outcome: Progressing   Problem: Clinical Measurements: Goal: Will remain free from infection Outcome: Progressing Goal: Diagnostic test results will improve Outcome: Progressing Goal: Respiratory complications will improve Outcome: Progressing   Problem: Activity: Goal: Risk for activity intolerance will decrease Outcome: Progressing   Problem: Nutrition: Goal: Adequate nutrition will be maintained Outcome: Progressing   Problem: Coping: Goal: Level of anxiety will decrease Outcome: Progressing   Problem: Elimination: Goal: Will not experience complications related to bowel motility Outcome: Progressing

## 2019-07-12 NOTE — Anesthesia Procedure Notes (Signed)
Procedure Name: Intubation Date/Time: 07/12/2019 9:17 AM Performed by: Renato Shin, CRNA Pre-anesthesia Checklist: Patient identified, Emergency Drugs available, Suction available and Patient being monitored Patient Re-evaluated:Patient Re-evaluated prior to induction Oxygen Delivery Method: Circle system utilized Preoxygenation: Pre-oxygenation with 100% oxygen Induction Type: IV induction Ventilation: Mask ventilation without difficulty Laryngoscope Size: Miller and 2 Grade View: Grade I Tube type: Oral Tube size: 7.5 mm Number of attempts: 1 Airway Equipment and Method: Stylet and Oral airway Placement Confirmation: ETT inserted through vocal cords under direct vision,  positive ETCO2 and breath sounds checked- equal and bilateral Secured at: 22 cm Tube secured with: Tape Dental Injury: Teeth and Oropharynx as per pre-operative assessment

## 2019-07-12 NOTE — Transfer of Care (Signed)
Immediate Anesthesia Transfer of Care Note  Patient: Robert Sullivan  Procedure(s) Performed: LEFT HIP DEBRIDEMENT (Left Hip)  Patient Location: PACU  Anesthesia Type:General  Level of Consciousness: awake, drowsy and patient cooperative  Airway & Oxygen Therapy: Patient Spontanous Breathing and Patient connected to face mask oxygen  Post-op Assessment: Report given to RN and Post -op Vital signs reviewed and stable  Post vital signs: Reviewed and stable  Last Vitals:  Vitals Value Taken Time  BP 125/68 07/12/19 1027  Temp 37.1 C 07/12/19 1027  Pulse 97 07/12/19 1031  Resp 13 07/12/19 1031  SpO2 99 % 07/12/19 1031  Vitals shown include unvalidated device data.  Last Pain:  Vitals:   07/12/19 0817  TempSrc: Oral  PainSc:       Patients Stated Pain Goal: 3 (19/80/22 1798)  Complications: No apparent anesthesia complications

## 2019-07-12 NOTE — Progress Notes (Signed)
Notified Dr Ninfa Linden of pt wd VAC drainage of 300 mL bloody drainage since 1930 and a total of 928m reported on day shift since returning to unit from surgery, pt pale and HR 123. Will continue to monitor patient and call back if change in pt status.

## 2019-07-12 NOTE — Op Note (Signed)
07/12/2019  10:27 AM  PATIENT:  Robert Sullivan    PRE-OPERATIVE DIAGNOSIS:  abscess left hip  POST-OPERATIVE DIAGNOSIS:  Same  PROCEDURE:  LEFT HIP DEBRIDEMENT With excision skin soft tissue muscle and fascia. Local tissue rearrangement for wound closure 40 x 10 cm. Application of Praveena customizable and arthroform wound VAC dressing.  SURGEON:  Newt Minion, MD  PHYSICIAN ASSISTANT:None ANESTHESIA:   General  PREOPERATIVE INDICATIONS:  [name] is a  54 y.o. male with a diagnosis of abscess left hip who failed conservative measures and elected for surgical management.    The risks benefits and alternatives were discussed with the patient preoperatively including but not limited to the risks of infection, bleeding, nerve injury, cardiopulmonary complications, the need for revision surgery, among others, and the patient was willing to proceed.  OPERATIVE IMPLANTS: Praveena arthroform and customizable wound VAC dressing  @ENCIMAGES @  OPERATIVE FINDINGS: Healthy viable tissue with mild ischemic changes along the wound edges.  OPERATIVE PROCEDURE: Patient was brought the operating room underwent a general anesthetic.  After adequate levels anesthesia were obtained patient was placed in the right lateral decubitus position with the left side up in the left lower extremity was prepped using DuraPrep draped into a sterile field a timeout was called.  There was ischemic change along the wound edges and approximately 2 cm was excised around the circumference of the wound.  This left a wound that was 40 x 10 cm.  This was carried sharply down to the fascia.  A 21 blade knife rondure and periosteal elevator was used to further debride the soft tissue and excised skin soft tissue muscle and fascia.  The tissue was viable healthy muscle had good color and contractility the fascia was viable with no necrotizing fasciitis.  The wound was irrigated with 3 L of pulse lavage electrocautery was used for  hemostasis.  The incision was closed using 2-0 nylon local tissue rearrangement was used to close the wound that was 40 x 10 cm.  A customizable wound VAC dressing was applied over the incision and this was covered over with a arthroform dressing to further decrease swelling.  This had a good suction fit patient was extubated taken the PACU in stable condition.  Patient received 2 vials of albumin and received 2 units of packed red blood cells.  Estimated blood loss 500 cc.   DISCHARGE PLANNING:  Antibiotic duration: Continue IV antibiotics per infectious disease  Weightbearing: Weightbearing as tolerated  Pain medication: Continue opioid pathway  Dressing care/ Wound VAC: Continue wound VAC for 1 week  Ambulatory devices: Walker or crutches  Discharge to: Patient may need discharge to skilled nursing pending recommendations from physical therapy.  Follow-up: In the office 1 week post operative.

## 2019-07-12 NOTE — Progress Notes (Signed)
PT Cancellation Note  Patient Details Name: Robert Sullivan MRN: [id number] DOB: 1966-03-07   Cancelled Treatment:    Reason Eval/Treat Not Completed: Patient at procedure or test/unavailable. Pt to OR for L hip debridement. PT will continue to f/u with pt acutely as available.    Iron Post 07/12/2019, 8:35 AM

## 2019-07-13 LAB — TYPE AND SCREEN
ABO/RH(D): A POS
Antibody Screen: NEGATIVE
Unit division: 0
Unit division: 0
Unit division: 0
Unit division: 0
Unit division: 0
Unit division: 0

## 2019-07-13 LAB — GLUCOSE, CAPILLARY
Glucose-Capillary: 163 mg/dL — ABNORMAL HIGH (ref 70–99)
Glucose-Capillary: 161 mg/dL — ABNORMAL HIGH (ref 70–99)
Glucose-Capillary: 174 mg/dL — ABNORMAL HIGH (ref 70–99)
Glucose-Capillary: 178 mg/dL — ABNORMAL HIGH (ref 70–99)

## 2019-07-13 LAB — BPAM RBC
Blood Product Expiration Date: 202101082359
Blood Product Expiration Date: 202101172359
Blood Product Expiration Date: 202101202359
Blood Product Expiration Date: 202101262359
Blood Product Expiration Date: 202101262359
Blood Product Expiration Date: 202101272359
ISSUE DATE / TIME: 202012301828
ISSUE DATE / TIME: 202012302006
ISSUE DATE / TIME: 202101020945
ISSUE DATE / TIME: 202101020945
ISSUE DATE / TIME: 202101031218
Unit Type and Rh: 6200
Unit Type and Rh: 6200
Unit Type and Rh: 6200
Unit Type and Rh: 6200
Unit Type and Rh: 6200
Unit Type and Rh: 6200

## 2019-07-13 LAB — CBC
HCT: 21.7 % — ABNORMAL LOW (ref 39.0–52.0)
Hemoglobin: 7 g/dL — ABNORMAL LOW (ref 13.0–17.0)
MCH: 31.4 pg (ref 26.0–34.0)
MCHC: 32.3 g/dL (ref 30.0–36.0)
MCV: 97.3 fL (ref 80.0–100.0)
Platelets: 178 10*3/uL (ref 150–400)
RBC: 2.23 MIL/uL — ABNORMAL LOW (ref 4.22–5.81)
RDW: 17.2 % — ABNORMAL HIGH (ref 11.5–15.5)
WBC: 16.3 10*3/uL — ABNORMAL HIGH (ref 4.0–10.5)
nRBC: 0 % (ref 0.0–0.2)

## 2019-07-13 LAB — HEMOGLOBIN AND HEMATOCRIT, BLOOD
HCT: 21.8 % — ABNORMAL LOW (ref 39.0–52.0)
Hemoglobin: 7.3 g/dL — ABNORMAL LOW (ref 13.0–17.0)

## 2019-07-13 LAB — PREPARE RBC (CROSSMATCH)

## 2019-07-13 MED ORDER — SODIUM CHLORIDE 0.9% IV SOLUTION: Freq: Once | INTRAVENOUS | Status: DC

## 2019-07-13 MED ORDER — ZOLPIDEM TARTRATE 5 MG PO TABS
5.0000 mg | ORAL_TABLET | Freq: Every evening | ORAL | Status: DC | PRN
  Administered 2019-07-13 – 2019-07-24 (×3): 5 mg via ORAL
  Filled 2019-07-13 (×3): qty 1

## 2019-07-13 MED ORDER — SODIUM CHLORIDE 0.9 % IV BOLUS
500.0000 mL | Freq: Once | INTRAVENOUS | Status: AC
  Administered 2019-07-13: 500 mL via INTRAVENOUS

## 2019-07-13 NOTE — Progress Notes (Signed)
Subjective: 1 Day Post-Op Procedure(s) (LRB): LEFT HIP DEBRIDEMENT (Left) Patient reports pain as moderate.  Denies chest pain. Some SOB earlier but has resolved now.   Objective: Vital signs in last 24 hours: Temp:  [97.6 F (36.4 C)-98.8 F (37.1 C)] 98.2 F (36.8 C) (01/03 0756) Pulse Rate:  [98-126] 113 (01/03 0756) Resp:  [15-20] 20 (01/03 0117) BP: (87-130)/(58-70) 100/65 (01/03 0756) SpO2:  [96 %-100 %] 98 % (01/03 0756)  Intake/Output from previous day: 01/02 0701 - 01/03 0700 In: 2250 [P.O.:720; I.V.:400; Blood:630; IV [id number]] Out: 2725 [Urine:300; Drains:1725; Blood:700] Intake/Output this shift: No intake/output data recorded.  Recent Labs    07/11/19 0331 07/13/19 0350  HGB 8.4* 7.0*   Recent Labs    07/11/19 0331 07/13/19 0350  WBC 5.2 16.3*  RBC 2.62* 2.23*  HCT 25.6* 21.7*  PLT PLATELET CLUMPS NOTED ON SMEAR, UNABLE TO ESTIMATE 178   Recent Labs    07/11/19 0331 07/12/19 0417  NA 135 134*  K 3.6 3.7  CL 107 105  CO2 22 21*  BUN 10 11  CREATININE 0.56* 0.47*  GLUCOSE 142* 128*  CALCIUM 7.5* 7.5*   No results for input(s): LABPT, INR in the last 72 hours.  General: Awake and alert.  Appears comfortable.  Dorsiflexion/Plantar flexion intact Compartment soft Wound vac in place canister almost full.   Assessment/Plan: 1 Day Post-Op Procedure(s) (LRB): LEFT HIP DEBRIDEMENT (Left)  Symptomatic ABLA secondary to surgery will give 2 units of PRBC's . Check CBC in AM .  Continue current care.  Leukocytosis monitor.       Michaelle Bottomley 07/13/2019, 8:21 AM

## 2019-07-13 NOTE — Progress Notes (Signed)
Notified Dr Ninfa Linden that pt had a 8 beat run of VTach. VS- Temp 98, BP 93/70, P126, R20. Pt voices no complaints. States he feels better after receiving dilaudid for pain and ondansetron for nausea. States his pain is now a 6/10. Patient is also requesting sleeping pill and orders received. Will continue to monitor pt.

## 2019-07-13 NOTE — Plan of Care (Signed)
  Problem: Pain Managment: Goal: General experience of comfort will improve Outcome: Progressing   Problem: Safety: Goal: Ability to remain free from injury will improve Outcome: Progressing   Problem: Skin Integrity: Goal: Risk for impaired skin integrity will decrease Outcome: Progressing   

## 2019-07-13 NOTE — Progress Notes (Signed)
Notified Dr Ninfa Linden of pt's hgb 7.0. Orders received to administer 2 units of PRBCs. Will continue to monitor pt's status.

## 2019-07-14 ENCOUNTER — Other Ambulatory Visit: Payer: Self-pay | Admitting: Physician Assistant

## 2019-07-14 ENCOUNTER — Encounter: Payer: Self-pay | Admitting: *Deleted

## 2019-07-14 DIAGNOSIS — Z1619 Resistance to other specified beta lactam antibiotics: Secondary | ICD-10-CM

## 2019-07-14 DIAGNOSIS — B9689 Other specified bacterial agents as the cause of diseases classified elsewhere: Secondary | ICD-10-CM

## 2019-07-14 LAB — GLUCOSE, CAPILLARY
Glucose-Capillary: 152 mg/dL — ABNORMAL HIGH (ref 70–99)
Glucose-Capillary: 169 mg/dL — ABNORMAL HIGH (ref 70–99)
Glucose-Capillary: 180 mg/dL — ABNORMAL HIGH (ref 70–99)
Glucose-Capillary: 152 mg/dL — ABNORMAL HIGH (ref 70–99)

## 2019-07-14 LAB — AEROBIC/ANAEROBIC CULTURE W GRAM STAIN (SURGICAL/DEEP WOUND)

## 2019-07-14 LAB — CBC
HCT: 21.1 % — ABNORMAL LOW (ref 39.0–52.0)
Hemoglobin: 7 g/dL — ABNORMAL LOW (ref 13.0–17.0)
MCH: 31.4 pg (ref 26.0–34.0)
MCHC: 33.2 g/dL (ref 30.0–36.0)
MCV: 94.6 fL (ref 80.0–100.0)
Platelets: 78 10*3/uL — ABNORMAL LOW (ref 150–400)
RBC: 2.23 MIL/uL — ABNORMAL LOW (ref 4.22–5.81)
RDW: 16.4 % — ABNORMAL HIGH (ref 11.5–15.5)
WBC: 6.6 10*3/uL (ref 4.0–10.5)
nRBC: 0 % (ref 0.0–0.2)

## 2019-07-14 MED ORDER — DEXTROSE 5 % IV SOLN
3.0000 g | INTRAVENOUS | Status: DC
  Filled 2019-07-14: qty 3000

## 2019-07-14 MED ORDER — CHLORHEXIDINE GLUCONATE 4 % EX LIQD: 60.0000 mL | Freq: Once | CUTANEOUS | Status: DC

## 2019-07-14 NOTE — Plan of Care (Signed)
  Problem: Education: Goal: Knowledge of General Education information will improve Description: Including pain rating scale, medication(s)/side effects and non-pharmacologic comfort measures Outcome: Progressing   Problem: Clinical Measurements: Goal: Respiratory complications will improve Outcome: Progressing Note: On room air   Problem: Nutrition: Goal: Adequate nutrition will be maintained Outcome: Progressing   Problem: Coping: Goal: Level of anxiety will decrease Outcome: Progressing   Problem: Elimination: Goal: Will not experience complications related to bowel motility Outcome: Progressing Note: BM this shift Goal: Will not experience complications related to urinary retention Outcome: Progressing   Problem: Pain Managment: Goal: General experience of comfort will improve Outcome: Progressing Note: Treated for pain once with dilaudid which helped   Problem: Safety: Goal: Ability to remain free from injury will improve Outcome: Progressing

## 2019-07-14 NOTE — Progress Notes (Signed)
Occupational Therapy Treatment Patient Details Name: Robert Sullivan MRN: [id number] DOB: 1966/05/31 Today's Date: 07/14/2019    History of present illness 54 year old male with recent history of fall and subsequent gluteal abscess.  He has undergone IR drainage and several surgical I&D.  repeat I&D 1/2. He has been on IV antibiotics for approximately 1 month.  Was discharged about a week ago and now comes back with worsening edema and drainage from prior surgical site. PMH including but not limited to DM2, OA, septic shock, obesity, and arthritis.   OT comments  Pt had I & D 1/2 to L hip. Pt currently reports no pain on L side, but appears to grimmace with mobility. Pt limited by fatigue, poor activity tolerance, increased assist required for ADL and decreased mobility. Pt minguardA for sit to stand from low chair in bathroom with pulling self to get up due to no handrails. Pt set-upA for grooming in sitting and fatigued after 25 mins of shaving and brushing teeth. Pt minguardA with RW. Pt heavily reliant on RW. Pt would benefit from continued OT skilled services for ADL, mobility, and safety. OT following acutely.   Follow Up Recommendations  Home health OT    Equipment Recommendations  Tub/shower seat;3 in 1 bedside commode;Other (comment)(bariatric shower chair and BSC  >350#)    Recommendations for Other Services      Precautions / Restrictions Precautions Precautions: Fall Precaution Comments: wound vac Restrictions Weight Bearing Restrictions: Yes LLE Weight Bearing: Weight bearing as tolerated       Mobility Bed Mobility Overal bed mobility: Needs Assistance Bed Mobility: Supine to Sit     Supine to sit: Min guard;HOB elevated     General bed mobility comments: HOB 15 degrees with use of rail  Transfers Overall transfer level: Needs assistance   Transfers: Sit to/from Stand;Stand Pivot Transfers Sit to Stand: Min guard;From elevated surface Stand pivot transfers: Min  guard       General transfer comment: sit to stand from elevated bed     Balance Overall balance assessment: Needs assistance Sitting-balance support: No upper extremity supported;Feet supported Sitting balance-Leahy Scale: Good       Standing balance-Leahy Scale: Fair Standing balance comment: use of RW for gait                           ADL either performed or assessed with clinical judgement   ADL Overall ADL's : Needs assistance/impaired     Grooming: Set up;Sitting Grooming Details (indicate cue type and reason): pt unable to stand for tasks due to possible onset of dizziness and fatigue                  Toilet Transfer: Min guard;Stand-pivot;Cueing for safety Toilet Transfer Details (indicate cue type and reason): sit to stand from low chair in bathroom with pulling self to get up due to no handrails.         Functional mobility during ADLs: Min guard;Rolling walker General ADL Comments: Pt limited by fatigue, poor activity tolerance, increased assist required for ADL and decreased mobility.     Vision   Vision Assessment?: No apparent visual deficits   Perception     Praxis      Cognition Arousal/Alertness: Awake/alert Behavior During Therapy: WFL for tasks assessed/performed Overall Cognitive Status: Within Functional Limits for tasks assessed  Exercises     Shoulder Instructions       General Comments VSS. Pt reporting no dizziness today.    Pertinent Vitals/ Pain       Pain Assessment: No/denies pain  Home Living                                          Prior Functioning/Environment              Frequency  Min 2X/week        Progress Toward Goals  OT Goals(current goals can now be found in the care plan section)  Progress towards OT goals: Progressing toward goals  Acute Rehab OT Goals Patient Stated Goal: home OT Goal Formulation:  With patient Time For Goal Achievement: 07/25/19 Potential to Achieve Goals: Good ADL Goals Pt Will Perform Grooming: with set-up;with supervision;standing Pt Will Perform Lower Body Bathing: with mod assist;with adaptive equipment;sitting/lateral leans;sit to/from stand Pt Will Perform Lower Body Dressing: with mod assist;sitting/lateral leans;sit to/from stand;with adaptive equipment Pt Will Transfer to Toilet: with supervision;ambulating;stand pivot transfer Pt Will Perform Toileting - Clothing Manipulation and hygiene: with mod assist;sit to/from stand  Plan Discharge plan remains appropriate    Co-evaluation                 AM-PAC OT "6 Clicks" Daily Activity     Outcome Measure   Help from another person eating meals?: None Help from another person taking care of personal grooming?: A Little Help from another person toileting, which includes using toliet, bedpan, or urinal?: A Lot Help from another person bathing (including washing, rinsing, drying)?: A Lot Help from another person to put on and taking off regular upper body clothing?: None Help from another person to put on and taking off regular lower body clothing?: Total 6 Click Score: 16    End of Session Equipment Utilized During Treatment: Rolling walker  OT Visit Diagnosis: Other abnormalities of gait and mobility (R26.89);Muscle weakness (generalized) (M62.81);Pain Pain - Right/Left: Left Pain - part of body: Leg   Activity Tolerance Patient limited by fatigue   Patient Left in bed;with call bell/phone within reach   Nurse Communication Mobility status;Other (comment)(IV is occluded)        Time: 1421-1505 OT Time Calculation (min): 44 min  Charges: OT General Charges $OT Visit: 1 Visit OT Treatments $Self Care/Home Management : 23-37 mins $Therapeutic Activity: 8-22 mins  Robert Sullivan  OTR/L Acute Rehabilitation Services Pager: [phone] Office: [phone]    [name] C 07/14/2019, 3:44  PM

## 2019-07-14 NOTE — Progress Notes (Signed)
S/P I and D Hip. HGB 7 . VSS has been having dark colored urine but voiding normal amounts. Patient states this has been occurring since his infection. But does resolve. Will continue to push fluids. VAC 350 cc . Blood in tube  is light red in color.  ID Cefepime current Antibiotic. Appreciate input. Will discuss with Dr. Sharol Given

## 2019-07-14 NOTE — Progress Notes (Signed)
Patient voided using urinal with dark amber urine this AM. He stated it has looked this way this entire admission. Patient states he has voided at least 3 times during the day yesterday and the color was about the same-(amber to dark amber per the patient). Patient voices no complaints of pain or discomfort upon urinating. States he is not urinating as much as he should since he is receiving IV fluids and drinking water and sprite. Dr Sharol Given notified.  Hbg this AM is 7.0. Dr Sharol Given notified. Will continue to monitor the patient.

## 2019-07-14 NOTE — Anesthesia Postprocedure Evaluation (Signed)
Anesthesia Post Note  Patient: Robert Sullivan  Procedure(s) Performed: LEFT HIP DEBRIDEMENT (Left Hip)     Patient location during evaluation: PACU Anesthesia Type: General Level of consciousness: awake and alert Pain management: pain level controlled Vital Signs Assessment: post-procedure vital signs reviewed and stable Respiratory status: spontaneous breathing, nonlabored ventilation, respiratory function stable and patient connected to nasal cannula oxygen Cardiovascular status: blood pressure returned to baseline and stable Postop Assessment: no apparent nausea or vomiting Anesthetic complications: no    Last Vitals:  Vitals:   07/13/19 1927 07/14/19 0515  BP: 127/72 110/75  Pulse: 99 (!) 102  Resp:    Temp: 36.4 C 36.5 C  SpO2: 99% 98%    Last Pain:  Vitals:   07/14/19 0538  TempSrc:   PainSc: [place]

## 2019-07-14 NOTE — Anesthesia Postprocedure Evaluation (Signed)
Anesthesia Post Note  Patient: Robert Sullivan  Procedure(s) Performed: INCISION AND DRAINAGE LEFT HIP/PELVIC ABSCESS, APPLICATION OF NEGATIVE PRESSURE WOUND VAC (Left Hip)     Patient location during evaluation: PACU Anesthesia Type: General Level of consciousness: sedated, patient cooperative and oriented Pain management: pain level controlled Vital Signs Assessment: post-procedure vital signs reviewed and stable Respiratory status: spontaneous breathing, nonlabored ventilation, respiratory function stable and patient connected to nasal cannula oxygen Cardiovascular status: blood pressure returned to baseline and stable (BP stabilizing with Albumin, IVF bolus.) Postop Assessment: no apparent nausea or vomiting Anesthetic complications: no               Janiqua Friscia,E. Chanti Golubski

## 2019-07-14 NOTE — Progress Notes (Signed)
Patient ID: Robert Sullivan, male   DOB: Nov 13, 1965, 54 y.o.   MRN: [id number]         [facility]  Date of Admission:  07/09/2019   Total days of antibiotics 35        Day 4 cefepime         ASSESSMENT: Unfortunately, he has Enterobacter wound infection superimposed upon his recent MSSA infection.  PLAN: 1. Continue cefepime  Active Problems:   Abscess of left hip   Blister of hip with infection   Scheduled Meds: . sodium chloride   Intravenous Once  . sodium chloride   Intravenous Once  . Chlorhexidine Gluconate Cloth  6 each Topical Daily  . docusate sodium  100 mg Oral BID  . insulin aspart  0-15 Units Subcutaneous TID WC  . insulin aspart  0-5 Units Subcutaneous QHS  . insulin glargine  16 Units Subcutaneous Daily  . Ensure Max Protein  11 oz Oral Daily  . sodium chloride flush  10-40 mL Intracatheter Q12H   Continuous Infusions: . sodium chloride 100 mL/hr at 07/13/19 2237  . sodium chloride    . sodium chloride    . ceFEPime (MAXIPIME) IV 2 g (07/14/19 0541)  . lactated ringers    . methocarbamol (ROBAXIN) IV     PRN Meds:.acetaminophen, bisacodyl, HYDROmorphone (DILAUDID) injection, magnesium citrate, methocarbamol **OR** methocarbamol (ROBAXIN) IV, metoCLOPramide **OR** metoCLOPramide (REGLAN) injection, ondansetron **OR** ondansetron (ZOFRAN) IV, oxyCODONE, polyethylene glycol, sodium chloride flush, zolpidem   SUBJECTIVE: He was hospitalized in early December with MSSA bacteremia and a left gluteal abscess requiring I&D x2.  He was discharged on IV cefazolin but says that he never felt better and continued to have high output from his VAC wound drain leading to readmission in late December.  He underwent repeat I&D on 07/09/2019 and operative cultures grew Enterobacter resistant to cefazolin.  He was switched to cefepime.  For the brief time that he was home he had no difficulty managing his IV cefazolin or PICC.  Review of  Systems: Review of Systems  Constitutional: Negative for chills, diaphoresis and fever.  Gastrointestinal: Negative for abdominal pain, diarrhea, nausea and vomiting.  Musculoskeletal: Positive for joint pain.    Allergies  Allergen Reactions  . Sulfa Antibiotics Rash    Total body rash    OBJECTIVE: Vitals:   07/13/19 1600 07/13/19 1927 07/14/19 0515 07/14/19 0917  BP: 122/74 127/72 110/75 (!) 147/89  Pulse: (!) 102 99 (!) 102 (!) 142  Resp:      Temp: 97.7 F (36.5 C) 97.6 F (36.4 C) 97.7 F (36.5 C) 97.6 F (36.4 C)  TempSrc: Oral Oral Oral Oral  SpO2: 98% 99% 98% 98%  Weight:      Height:       Body mass index is 46.32 kg/m.  Physical Exam Constitutional:      Comments: He appears very frustrated but otherwise in no distress.  Skin:    Comments: Right arm PIC site looks good.  Psychiatric:        Mood and Affect: Mood normal.     Lab Results Lab Results  Component Value Date   WBC 6.6 07/14/2019   HGB 7.0 (L) 07/14/2019   HCT 21.1 (L) 07/14/2019   MCV 94.6 07/14/2019   PLT 78 (L) 07/14/2019    Lab Results  Component Value Date   CREATININE 0.47 (L) 07/12/2019   BUN 11 07/12/2019   NA 134 (L) 07/12/2019   K 3.7  07/12/2019   CL 105 07/12/2019   CO2 21 (L) 07/12/2019    Lab Results  Component Value Date   ALT 33 06/29/2019   AST 105 (H) 06/29/2019   ALKPHOS 260 (H) 06/29/2019   BILITOT 1.1 06/29/2019     Microbiology: Recent Results (from the past 240 hour(s))  Respiratory Panel by RT PCR (Flu A&B, Covid) - Nasopharyngeal Swab     Status: None   Collection Time: 07/09/19  1:36 PM   Specimen: Nasopharyngeal Swab  Result Value Ref Range Status   SARS Coronavirus 2 by RT PCR NEGATIVE NEGATIVE Final    Comment: (NOTE) SARS-CoV-2 target nucleic acids are NOT DETECTED. The SARS-CoV-2 RNA is generally detectable in upper respiratoy specimens during the acute phase of infection. The lowest concentration of SARS-CoV-2 viral copies this assay can  detect is 131 copies/mL. A negative result does not preclude SARS-Cov-2 infection and should not be used as the sole basis for treatment or other patient management decisions. A negative result may occur with  improper specimen collection/handling, submission of specimen other than nasopharyngeal swab, presence of viral mutation(s) within the areas targeted by this assay, and inadequate number of viral copies (<131 copies/mL). A negative result must be combined with clinical observations, patient history, and epidemiological information. The expected result is Negative. Fact Sheet for Patients:  PinkCheek.be Fact Sheet for Healthcare Providers:  GravelBags.it This test is not yet ap proved or cleared by the [place] FDA and  has been authorized for detection and/or diagnosis of SARS-CoV-2 by FDA under an Emergency Use Authorization (EUA). This EUA will remain  in effect (meaning this test can be used) for the duration of the COVID-19 declaration under Section 564(b)(1) of the Act, 21 U.S.C. section 360bbb-3(b)(1), unless the authorization is terminated or revoked sooner.    Influenza A by PCR NEGATIVE NEGATIVE Final   Influenza B by PCR NEGATIVE NEGATIVE Final    Comment: (NOTE) The Xpert Xpress SARS-CoV-2/FLU/RSV assay is intended as an aid in  the diagnosis of influenza from Nasopharyngeal swab specimens and  should not be used as a sole basis for treatment. Nasal washings and  aspirates are unacceptable for Xpert Xpress SARS-CoV-2/FLU/RSV  testing. Fact Sheet for Patients: PinkCheek.be Fact Sheet for Healthcare Providers: GravelBags.it This test is not yet approved or cleared by the [place] FDA and  has been authorized for detection and/or diagnosis of SARS-CoV-2 by  FDA under an Emergency Use Authorization (EUA). This EUA will remain  in effect (meaning  this test can be used) for the duration of the  Covid-19 declaration under Section 564(b)(1) of the Act, 21  U.S.C. section 360bbb-3(b)(1), unless the authorization is  terminated or revoked. Performed at [facility], [place] [address] [place], [place] [address]   Aerobic/Anaerobic Culture (surgical/deep wound)     Status: None (Preliminary result)   Collection Time: 07/09/19  5:29 PM   Specimen: Soft Tissue, Other  Result Value Ref Range Status   Specimen Description TISSUE LEFT HIP  Final   Special Requests NONE  Final   Gram Stain   Final    RARE WBC PRESENT, PREDOMINANTLY PMN NO ORGANISMS SEEN    Culture   Final    RARE ENTEROBACTER CLOACAE NO ANAEROBES ISOLATED; CULTURE IN PROGRESS FOR 5 DAYS CRITICAL RESULT CALLED TO, READ BACK BY AND VERIFIED WITH: RN ANITA MANNING [phone] FCP Performed at [facility], [place] [address], [place], [place] [address]    Report Status  PENDING  Incomplete   Organism ID, Bacteria ENTEROBACTER CLOACAE  Final      Susceptibility   Enterobacter cloacae - MIC*    CEFAZOLIN >=64 RESISTANT Resistant     CEFEPIME <=0.12 SENSITIVE Sensitive     CEFTAZIDIME <=1 SENSITIVE Sensitive     CIPROFLOXACIN <=0.25 SENSITIVE Sensitive     GENTAMICIN <=1 SENSITIVE Sensitive     IMIPENEM 0.5 SENSITIVE Sensitive     TRIMETH/SULFA <=20 SENSITIVE Sensitive     PIP/TAZO <=4 SENSITIVE Sensitive     * RARE ENTEROBACTER CLOACAE  MRSA PCR Screening     Status: None   Collection Time: 07/09/19  9:24 PM   Specimen: Nasal Mucosa; Nasopharyngeal  Result Value Ref Range Status   MRSA by PCR NEGATIVE NEGATIVE Final    Comment:        The GeneXpert MRSA Assay (FDA approved for NASAL specimens only), is one component of a comprehensive MRSA colonization surveillance program. It is not intended to diagnose MRSA infection nor to guide or monitor treatment for MRSA infections. Performed at [facility], [place] [address], [place], [place] [address]      Michel Bickers, [place] [facility] [place] [phone] pager   [phone] cell 07/14/2019, 10:49 AM

## 2019-07-14 NOTE — Progress Notes (Signed)
Physical Therapy Treatment Patient Details Name: Robert Sullivan MRN: [id number] DOB: 1965-07-18 Today's Date: 07/14/2019    History of Present Illness 54 year old male with recent history of fall and subsequent gluteal abscess.  He has undergone IR drainage and several surgical I&D.  repeat I&D 1/2. He has been on IV antibiotics for approximately 1 month.  Was discharged about a week ago and now comes back with worsening edema and drainage from prior surgical site. PMH including but not limited to DM2, OA, septic shock, obesity, and arthritis.    PT Comments    Pt pleasant and reports having not been OOB since before 1/2 with need to get up to toilet. PT with good mobility and tolerance for weight bearing on LLE. Pt with reliance on rail for exiting bed and use of RW for gait. Activity limited by need to toilet with nursing staff aware of pt in bathroom with call button end of session. Pt encouraged to walk with nursing. Will continue to follow.    Follow Up Recommendations  Home health PT     Equipment Recommendations  3in1 (PT)(bari)    Recommendations for Other Services       Precautions / Restrictions Precautions Precautions: Fall Precaution Comments: wound vac Restrictions LLE Weight Bearing: Weight bearing as tolerated    Mobility  Bed Mobility Overal bed mobility: Needs Assistance Bed Mobility: Supine to Sit     Supine to sit: Min guard;HOB elevated     General bed mobility comments: HOB 15 degrees with use of rail  Transfers Overall transfer level: Needs assistance   Transfers: Sit to/from Stand Sit to Stand: Min guard;From elevated surface         General transfer comment: pt able to rise from bed with use of rail and elevated bed. lowered to toilet with use of grab bar  Ambulation/Gait Ambulation/Gait assistance: Min guard Gait Distance (Feet): 12 Feet Assistive device: Rolling walker (2 wheeled) Gait Pattern/deviations: Decreased stride  length;Step-through pattern;Wide base of support   Gait velocity interpretation: <1.8 ft/sec, indicate of risk for recurrent falls General Gait Details: pt able to walk to toilet with use of RW and wide BOS   Stairs             Wheelchair Mobility    Modified Rankin (Stroke Patients Only)       Balance Overall balance assessment: Needs assistance   Sitting balance-Leahy Scale: Good       Standing balance-Leahy Scale: Fair Standing balance comment: use of RW for gait                            Cognition Arousal/Alertness: Awake/alert Behavior During Therapy: WFL for tasks assessed/performed Overall Cognitive Status: Within Functional Limits for tasks assessed                                        Exercises      General Comments        Pertinent Vitals/Pain Pain Assessment: No/denies pain    Home Living                      Prior Function            PT Goals (current goals can now be found in the care plan section) Progress towards PT goals: Progressing toward goals  Frequency    Min 3X/week      PT Plan Current plan remains appropriate    Co-evaluation              AM-PAC PT "6 Clicks" Mobility   Outcome Measure  Help needed turning from your back to your side while in a flat bed without using bedrails?: A Little Help needed moving from lying on your back to sitting on the side of a flat bed without using bedrails?: A Little Help needed moving to and from a bed to a chair (including a wheelchair)?: A Little Help needed standing up from a chair using your arms (e.g., wheelchair or bedside chair)?: A Little Help needed to walk in hospital room?: A Little Help needed climbing 3-5 steps with a railing? : A Lot 6 Click Score: 17    End of Session   Activity Tolerance: Patient tolerated treatment well Patient left: Other (comment);with call bell/phone within reach(toilet with nursing staff  aware) Nurse Communication: Mobility status PT Visit Diagnosis: Other abnormalities of gait and mobility (R26.89);Muscle weakness (generalized) (M62.81);Difficulty in walking, not elsewhere classified (R26.2);Pain     Time: 8295-6213 PT Time Calculation (min) (ACUTE ONLY): 27 min  Charges:  $Gait Training: 8-22 mins $Therapeutic Activity: 8-22 mins                     Keelyn Monjaras P, PT Acute Rehabilitation Services Pager: [phone] Office: [place] 07/14/2019, 8:54 AM

## 2019-07-15 ENCOUNTER — Other Ambulatory Visit: Payer: Self-pay | Admitting: Physician Assistant

## 2019-07-15 DIAGNOSIS — Z9889 Other specified postprocedural states: Secondary | ICD-10-CM

## 2019-07-15 LAB — BASIC METABOLIC PANEL
Anion gap: 5 (ref 5–15)
BUN: 19 mg/dL (ref 6–20)
CO2: 20 mmol/L — ABNORMAL LOW (ref 22–32)
Calcium: 7.5 mg/dL — ABNORMAL LOW (ref 8.9–10.3)
Chloride: 107 mmol/L (ref 98–111)
Creatinine, Ser: 0.64 mg/dL (ref 0.61–1.24)
GFR calc Af Amer: >60 mL/min (ref >60)
GFR calc non Af Amer: >60 mL/min (ref >60)
Glucose, Bld: 150 mg/dL — ABNORMAL HIGH (ref 70–99)
Potassium: 4 mmol/L (ref 3.5–5.1)
Sodium: 132 mmol/L — ABNORMAL LOW (ref 135–145)

## 2019-07-15 LAB — GLUCOSE, CAPILLARY
Glucose-Capillary: 144 mg/dL — ABNORMAL HIGH (ref 70–99)
Glucose-Capillary: 148 mg/dL — ABNORMAL HIGH (ref 70–99)
Glucose-Capillary: 177 mg/dL — ABNORMAL HIGH (ref 70–99)
Glucose-Capillary: 149 mg/dL — ABNORMAL HIGH (ref 70–99)

## 2019-07-15 LAB — HEMOGLOBIN AND HEMATOCRIT, BLOOD
HCT: 21.2 % — ABNORMAL LOW (ref 39.0–52.0)
HCT: 25.9 % — ABNORMAL LOW (ref 39.0–52.0)
Hemoglobin: 7 g/dL — ABNORMAL LOW (ref 13.0–17.0)
Hemoglobin: 8.6 g/dL — ABNORMAL LOW (ref 13.0–17.0)

## 2019-07-15 LAB — PREPARE RBC (CROSSMATCH)

## 2019-07-15 MED ORDER — ACETAMINOPHEN 325 MG PO TABS
650.0000 mg | ORAL_TABLET | Freq: Once | ORAL | Status: AC
  Administered 2019-07-15: 650 mg via ORAL
  Filled 2019-07-15: qty 2

## 2019-07-15 MED ORDER — DIPHENHYDRAMINE HCL 25 MG PO CAPS
25.0000 mg | ORAL_CAPSULE | Freq: Once | ORAL | Status: AC
  Administered 2019-07-15: 25 mg via ORAL
  Filled 2019-07-15: qty 1

## 2019-07-15 MED ORDER — SODIUM CHLORIDE 0.9% IV SOLUTION: Freq: Once | INTRAVENOUS | Status: AC

## 2019-07-15 NOTE — Plan of Care (Signed)
  Problem: Education: Goal: Knowledge of General Education information will improve Description: Including pain rating scale, medication(s)/side effects and non-pharmacologic comfort measures Outcome: Progressing   Problem: Clinical Measurements: Goal: Respiratory complications will improve Outcome: Progressing Note: Room air   Problem: Activity: Goal: Risk for activity intolerance will decrease Outcome: Progressing Note: Up to  bathroom with 1 assist and walker tolerated well   Problem: Coping: Goal: Level of anxiety will decrease Outcome: Progressing Note: Still kind of depressed mood all day   Problem: Elimination: Goal: Will not experience complications related to bowel motility Outcome: Progressing Note: Was constipated when trying to have a BM, magnesium citrate given, with large stool afterward Goal: Will not experience complications related to urinary retention Outcome: Progressing   Problem: Pain Managment: Goal: General experience of comfort will improve Outcome: Progressing Note: Treated for left hip pain once with oxycodone   Problem: Safety: Goal: Ability to remain free from injury will improve Outcome: Progressing   Problem: Skin Integrity: Goal: Demonstration of wound healing without infection will improve Outcome: Progressing Note: Wound vac in place

## 2019-07-15 NOTE — Progress Notes (Signed)
PHARMACY CONSULT NOTE FOR:  OUTPATIENT  PARENTERAL ANTIBIOTIC THERAPY (OPAT)  Indication: Left hip wound infection Regimen: Cefepime 2 gm Q 8 hours End date: 08/07/19  IV antibiotic discharge orders are pended. To discharging provider:  please sign these orders via discharge navigator,  Select New Orders & click on the button choice - Manage This Unsigned Work.     Thank you for allowing pharmacy to be a part of this patient's care.  Jimmy Footman, PharmD, BCPS, BCIDP Infectious Diseases Clinical Pharmacist Phone: [phone] 07/15/2019, 12:14 PM

## 2019-07-15 NOTE — Progress Notes (Signed)
Patient ID: Robert Sullivan, male   DOB: January 22, 1966, 54 y.o.   MRN: [id number]         [facility]  Date of Admission:  07/09/2019   Total days of antibiotics 35        Day 4 cefepime         ASSESSMENT: Unfortunately, he has an  Enterobacter left hip wound wound infection superimposed upon his recent MSSA infection.  His recent operative report does not seem to indicate any involvement of his joint.  I recommend continuing cefepime for 4 weeks total.  PLAN: 1. Continue cefepime 2. I will sign off now  Diagnosis: Left hip wound infection  Culture Result: Enterobacter  Allergies  Allergen Reactions  . Sulfa Antibiotics Rash    Total body rash    OPAT Orders Discharge antibiotics: Per pharmacy protocol cefepime  Duration: 4 weeks End Date: 08/07/2019   [facility] Care Per Protocol:  Labs weekly while on IV antibiotics: _x_ CBC with differential _x_ BMP __ CMP __ CRP __ ESR __ Vancomycin trough __ CK  __ Please pull PIC at completion of IV antibiotics _x_ Please leave PIC in place until doctor has seen patient or been notified  Fax weekly labs to [phone]  Clinic Follow Up Appt: 08/06/2019  Active Problems:   Abscess of left hip   Blister of hip with infection   Scheduled Meds: . chlorhexidine  60 mL Topical Once  . Chlorhexidine Gluconate Cloth  6 each Topical Daily  . docusate sodium  100 mg Oral BID  . insulin aspart  0-15 Units Subcutaneous TID WC  . insulin aspart  0-5 Units Subcutaneous QHS  . insulin glargine  16 Units Subcutaneous Daily  . Ensure Max Protein  11 oz Oral Daily  . sodium chloride flush  10-40 mL Intracatheter Q12H   Continuous Infusions: . sodium chloride 100 mL/hr at 07/15/19 0851  . sodium chloride 10 mL/hr at 07/15/19 1033  .  ceFAZolin (ANCEF) IV    . ceFEPime (MAXIPIME) IV Stopped (07/15/19 0618)  . lactated ringers    . methocarbamol (ROBAXIN) IV     PRN Meds:.acetaminophen, bisacodyl,  HYDROmorphone (DILAUDID) injection, magnesium citrate, methocarbamol **OR** methocarbamol (ROBAXIN) IV, metoCLOPramide **OR** metoCLOPramide (REGLAN) injection, ondansetron **OR** ondansetron (ZOFRAN) IV, oxyCODONE, polyethylene glycol, sodium chloride flush, zolpidem   SUBJECTIVE: He says that he is feeling very weak.  Review of Systems: Review of Systems  Constitutional: Positive for malaise/fatigue. Negative for chills, diaphoresis and fever.  Gastrointestinal: Negative for abdominal pain, diarrhea, nausea and vomiting.  Musculoskeletal: Positive for joint pain.    Allergies  Allergen Reactions  . Sulfa Antibiotics Rash    Total body rash    OBJECTIVE: Vitals:   07/15/19 0525 07/15/19 0958 07/15/19 1034 07/15/19 1050  BP: 116/74 130/63 127/65 123/68  Pulse: 91 91 91 92  Resp: 16 18 18 20   Temp: 98.3 F (36.8 C) (!) 97.5 F (36.4 C) (!) 97.5 F (36.4 C) 97.7 F (36.5 C)  TempSrc: Oral Oral Oral Oral  SpO2: 99% 97% 99% 98%  Weight:      Height:       Body mass index is 46.32 kg/m.  Physical Exam Constitutional:      Comments: He is resting quietly in bed.  He is receiving a blood transfusion.  Skin:    Comments: Right arm PIC site looks good.  Psychiatric:        Mood and Affect: Mood normal.  Lab Results Lab Results  Component Value Date   WBC 6.6 07/14/2019   HGB 7.0 (L) 07/15/2019   HCT 21.2 (L) 07/15/2019   MCV 94.6 07/14/2019   PLT 78 (L) 07/14/2019    Lab Results  Component Value Date   CREATININE 0.64 07/15/2019   BUN 19 07/15/2019   NA 132 (L) 07/15/2019   K 4.0 07/15/2019   CL 107 07/15/2019   CO2 20 (L) 07/15/2019    Lab Results  Component Value Date   ALT 33 06/29/2019   AST 105 (H) 06/29/2019   ALKPHOS 260 (H) 06/29/2019   BILITOT 1.1 06/29/2019     Microbiology: Recent Results (from the past 240 hour(s))  Respiratory Panel by RT PCR (Flu A&B, Covid) - Nasopharyngeal Swab     Status: None   Collection Time: 07/09/19  1:36 PM    Specimen: Nasopharyngeal Swab  Result Value Ref Range Status   SARS Coronavirus 2 by RT PCR NEGATIVE NEGATIVE Final    Comment: (NOTE) SARS-CoV-2 target nucleic acids are NOT DETECTED. The SARS-CoV-2 RNA is generally detectable in upper respiratoy specimens during the acute phase of infection. The lowest concentration of SARS-CoV-2 viral copies this assay can detect is 131 copies/mL. A negative result does not preclude SARS-Cov-2 infection and should not be used as the sole basis for treatment or other patient management decisions. A negative result may occur with  improper specimen collection/handling, submission of specimen other than nasopharyngeal swab, presence of viral mutation(s) within the areas targeted by this assay, and inadequate number of viral copies (<131 copies/mL). A negative result must be combined with clinical observations, patient history, and epidemiological information. The expected result is Negative. Fact Sheet for Patients:  PinkCheek.be Fact Sheet for Healthcare Providers:  GravelBags.it This test is not yet ap proved or cleared by the [place] FDA and  has been authorized for detection and/or diagnosis of SARS-CoV-2 by FDA under an Emergency Use Authorization (EUA). This EUA will remain  in effect (meaning this test can be used) for the duration of the COVID-19 declaration under Section 564(b)(1) of the Act, 21 U.S.C. section 360bbb-3(b)(1), unless the authorization is terminated or revoked sooner.    Influenza A by PCR NEGATIVE NEGATIVE Final   Influenza B by PCR NEGATIVE NEGATIVE Final    Comment: (NOTE) The Xpert Xpress SARS-CoV-2/FLU/RSV assay is intended as an aid in  the diagnosis of influenza from Nasopharyngeal swab specimens and  should not be used as a sole basis for treatment. Nasal washings and  aspirates are unacceptable for Xpert Xpress SARS-CoV-2/FLU/RSV  testing. Fact  Sheet for Patients: PinkCheek.be Fact Sheet for Healthcare Providers: GravelBags.it This test is not yet approved or cleared by the [place] FDA and  has been authorized for detection and/or diagnosis of SARS-CoV-2 by  FDA under an Emergency Use Authorization (EUA). This EUA will remain  in effect (meaning this test can be used) for the duration of the  Covid-19 declaration under Section 564(b)(1) of the Act, 21  U.S.C. section 360bbb-3(b)(1), unless the authorization is  terminated or revoked. Performed at [facility], [place] [address], [place], [place] [address]   Aerobic/Anaerobic Culture (surgical/deep wound)     Status: None   Collection Time: 07/09/19  5:29 PM   Specimen: Soft Tissue, Other  Result Value Ref Range Status   Specimen Description TISSUE LEFT HIP  Final   Special Requests NONE  Final   Gram Stain   Final    RARE  WBC PRESENT, PREDOMINANTLY PMN NO ORGANISMS SEEN    Culture   Final    RARE ENTEROBACTER CLOACAE NO ANAEROBES ISOLATED CRITICAL RESULT CALLED TO, READ BACK BY AND VERIFIED WITH: RN ANITA MANNING [phone] FCP Performed at [facility], [place] [address] [place], [place] [address]    Report Status 07/14/2019 FINAL  Final   Organism ID, Bacteria ENTEROBACTER CLOACAE  Final      Susceptibility   Enterobacter cloacae - MIC*    CEFAZOLIN >=64 RESISTANT Resistant     CEFEPIME <=0.12 SENSITIVE Sensitive     CEFTAZIDIME <=1 SENSITIVE Sensitive     CIPROFLOXACIN <=0.25 SENSITIVE Sensitive     GENTAMICIN <=1 SENSITIVE Sensitive     IMIPENEM 0.5 SENSITIVE Sensitive     TRIMETH/SULFA <=20 SENSITIVE Sensitive     PIP/TAZO <=4 SENSITIVE Sensitive     * RARE ENTEROBACTER CLOACAE  MRSA PCR Screening     Status: None   Collection Time: 07/09/19  9:24 PM   Specimen: Nasal Mucosa; Nasopharyngeal  Result Value Ref Range Status   MRSA by PCR NEGATIVE NEGATIVE Final    Comment:        The  GeneXpert MRSA Assay (FDA approved for NASAL specimens only), is one component of a comprehensive MRSA colonization surveillance program. It is not intended to diagnose MRSA infection nor to guide or monitor treatment for MRSA infections. Performed at [facility], [place] [address] [place],  [address]     Michel Bickers, [place] [phone] pager   [phone] cell 07/15/2019, 11:58 AM

## 2019-07-15 NOTE — H&P (View-Only) (Signed)
HGB 7 yesterday. Wound cannister full and emptied by nursing . VSS but some tacycardia most likely secondary to anemia. Per Dr. Sharol Given will plan for transfusion 2units today. Discussed with patient and he is in agreement with plan

## 2019-07-15 NOTE — Progress Notes (Signed)
HGB 7 yesterday. Wound cannister full and emptied by nursing . VSS but some tacycardia most likely secondary to anemia. Per Dr. Sharol Given will plan for transfusion 2units today. Discussed with patient and he is in agreement with plan

## 2019-07-16 ENCOUNTER — Encounter (HOSPITAL_COMMUNITY)
Admission: RE | Disposition: A | Discharge status: Home / Self Care | Payer: Self-pay | Source: Intra-hospital | Attending: Orthopedic Surgery

## 2019-07-16 ENCOUNTER — Inpatient Hospital Stay (HOSPITAL_COMMUNITY): Payer: Self-pay | Admitting: Anesthesiology

## 2019-07-16 ENCOUNTER — Telehealth: Payer: Self-pay

## 2019-07-16 ENCOUNTER — Encounter (HOSPITAL_COMMUNITY): Payer: Self-pay | Admitting: Orthopedic Surgery

## 2019-07-16 HISTORY — PX: I & D EXTREMITY: SHX5045

## 2019-07-16 LAB — GLUCOSE, CAPILLARY
Glucose-Capillary: 111 mg/dL — ABNORMAL HIGH (ref 70–99)
Glucose-Capillary: 136 mg/dL — ABNORMAL HIGH (ref 70–99)
Glucose-Capillary: 145 mg/dL — ABNORMAL HIGH (ref 70–99)
Glucose-Capillary: 143 mg/dL — ABNORMAL HIGH (ref 70–99)
Glucose-Capillary: 135 mg/dL — ABNORMAL HIGH (ref 70–99)

## 2019-07-16 LAB — POCT I-STAT, CHEM 8
BUN: 17 mg/dL (ref 6–20)
Calcium, Ion: 1.22 mmol/L (ref 1.15–1.40)
Chloride: 107 mmol/L (ref 98–111)
Creatinine, Ser: 0.6 mg/dL — ABNORMAL LOW (ref 0.61–1.24)
Glucose, Bld: 128 mg/dL — ABNORMAL HIGH (ref 70–99)
HCT: 22 % — ABNORMAL LOW (ref 39.0–52.0)
Hemoglobin: 7.5 g/dL — ABNORMAL LOW (ref 13.0–17.0)
Potassium: 4.5 mmol/L (ref 3.5–5.1)
Sodium: 136 mmol/L (ref 135–145)
TCO2: 19 mmol/L — ABNORMAL LOW (ref 22–32)

## 2019-07-16 LAB — PREPARE RBC (CROSSMATCH)

## 2019-07-16 SURGERY — IRRIGATION AND DEBRIDEMENT EXTREMITY
Anesthesia: General | Site: Hip | Laterality: Left

## 2019-07-16 MED ORDER — ACETAMINOPHEN 160 MG/5ML PO SOLN: 325.0000 mg | ORAL | Status: DC | PRN

## 2019-07-16 MED ORDER — METHOCARBAMOL 500 MG PO TABS: 500.0000 mg | ORAL_TABLET | Freq: Four times a day (QID) | ORAL | Status: DC | PRN

## 2019-07-16 MED ORDER — KETAMINE HCL 50 MG/5ML IJ SOSY
PREFILLED_SYRINGE | INTRAMUSCULAR | Status: AC
  Filled 2019-07-16: qty 5

## 2019-07-16 MED ORDER — FENTANYL CITRATE (PF) 100 MCG/2ML IJ SOLN
INTRAMUSCULAR | Status: DC | PRN
  Administered 2019-07-16: 100 ug via INTRAVENOUS

## 2019-07-16 MED ORDER — TRANEXAMIC ACID-NACL 1000-0.7 MG/100ML-% IV SOLN
INTRAVENOUS | Status: AC
  Filled 2019-07-16: qty 100

## 2019-07-16 MED ORDER — DOCUSATE SODIUM 100 MG PO CAPS: 100.0000 mg | ORAL_CAPSULE | Freq: Two times a day (BID) | ORAL | Status: DC

## 2019-07-16 MED ORDER — TRANEXAMIC ACID-NACL 1000-0.7 MG/100ML-% IV SOLN
INTRAVENOUS | Status: DC | PRN
  Administered 2019-07-16: 1000 mg via INTRAVENOUS

## 2019-07-16 MED ORDER — KETAMINE HCL 10 MG/ML IJ SOLN
INTRAMUSCULAR | Status: DC | PRN
  Administered 2019-07-16: 50 mg via INTRAVENOUS

## 2019-07-16 MED ORDER — ONDANSETRON HCL 4 MG/2ML IJ SOLN: 4.0000 mg | Freq: Four times a day (QID) | INTRAMUSCULAR | Status: DC | PRN

## 2019-07-16 MED ORDER — METOCLOPRAMIDE HCL 5 MG PO TABS: 5.0000 mg | ORAL_TABLET | Freq: Three times a day (TID) | ORAL | Status: DC | PRN

## 2019-07-16 MED ORDER — ONDANSETRON HCL 4 MG PO TABS: 4.0000 mg | ORAL_TABLET | Freq: Four times a day (QID) | ORAL | Status: DC | PRN

## 2019-07-16 MED ORDER — ALBUMIN HUMAN 5 % IV SOLN: INTRAVENOUS | Status: DC | PRN

## 2019-07-16 MED ORDER — ENSURE PRE-SURGERY PO LIQD
296.0000 mL | Freq: Once | ORAL | Status: AC
  Administered 2019-07-16: 296 mL via ORAL
  Filled 2019-07-16 (×2): qty 296

## 2019-07-16 MED ORDER — ROCURONIUM BROMIDE 10 MG/ML (PF) SYRINGE
PREFILLED_SYRINGE | INTRAVENOUS | Status: AC
  Filled 2019-07-16: qty 10

## 2019-07-16 MED ORDER — PHENYLEPHRINE 40 MCG/ML (10ML) SYRINGE FOR IV PUSH (FOR BLOOD PRESSURE SUPPORT)
PREFILLED_SYRINGE | INTRAVENOUS | Status: AC
  Filled 2019-07-16: qty 10

## 2019-07-16 MED ORDER — POLYETHYLENE GLYCOL 3350 17 G PO PACK: 17.0000 g | PACK | Freq: Every day | ORAL | Status: DC | PRN

## 2019-07-16 MED ORDER — OXYCODONE HCL 5 MG/5ML PO SOLN: 5.0000 mg | Freq: Once | ORAL | Status: DC | PRN

## 2019-07-16 MED ORDER — CHLORHEXIDINE GLUCONATE 4 % EX LIQD: 60.0000 mL | Freq: Once | CUTANEOUS | Status: DC

## 2019-07-16 MED ORDER — MAGNESIUM CITRATE PO SOLN: 1.0000 | Freq: Once | ORAL | Status: DC | PRN

## 2019-07-16 MED ORDER — ONDANSETRON HCL 4 MG/2ML IJ SOLN
INTRAMUSCULAR | Status: DC | PRN
  Administered 2019-07-16: 4 mg via INTRAVENOUS

## 2019-07-16 MED ORDER — MEPERIDINE HCL 25 MG/ML IJ SOLN: 6.2500 mg | INTRAMUSCULAR | Status: DC | PRN

## 2019-07-16 MED ORDER — SODIUM CHLORIDE 0.9 % IV SOLN: INTRAVENOUS | Status: DC

## 2019-07-16 MED ORDER — PROPOFOL 10 MG/ML IV BOLUS
INTRAVENOUS | Status: AC
  Filled 2019-07-16: qty 20

## 2019-07-16 MED ORDER — TRANEXAMIC ACID 1000 MG/10ML IV SOLN
2000.0000 mg | Freq: Once | INTRAVENOUS | Status: DC
  Filled 2019-07-16: qty 20

## 2019-07-16 MED ORDER — SUCCINYLCHOLINE CHLORIDE 20 MG/ML IJ SOLN
INTRAMUSCULAR | Status: DC | PRN
  Administered 2019-07-16: 200 mg via INTRAVENOUS

## 2019-07-16 MED ORDER — METOCLOPRAMIDE HCL 5 MG/ML IJ SOLN: 5.0000 mg | Freq: Three times a day (TID) | INTRAMUSCULAR | Status: DC | PRN

## 2019-07-16 MED ORDER — OXYCODONE HCL 5 MG PO TABS
10.0000 mg | ORAL_TABLET | ORAL | Status: DC | PRN
  Administered 2019-07-17 – 2019-07-18 (×5): 10 mg via ORAL
  Administered 2019-07-19 – 2019-07-20 (×2): 15 mg via ORAL
  Administered 2019-07-21 – 2019-07-24 (×11): 10 mg via ORAL
  Administered 2019-07-25 (×2): 15 mg via ORAL
  Filled 2019-07-16: qty 2
  Filled 2019-07-16: qty 3
  Filled 2019-07-16: qty 2
  Filled 2019-07-16 (×2): qty 3
  Filled 2019-07-16 (×2): qty 2
  Filled 2019-07-16: qty 3
  Filled 2019-07-16 (×2): qty 2

## 2019-07-16 MED ORDER — LIDOCAINE 2% (20 MG/ML) 5 ML SYRINGE
INTRAMUSCULAR | Status: AC
  Filled 2019-07-16: qty 5

## 2019-07-16 MED ORDER — PHENYLEPHRINE 40 MCG/ML (10ML) SYRINGE FOR IV PUSH (FOR BLOOD PRESSURE SUPPORT)
PREFILLED_SYRINGE | INTRAVENOUS | Status: DC | PRN
  Administered 2019-07-16 (×2): 80 ug via INTRAVENOUS
  Administered 2019-07-16 (×4): 120 ug via INTRAVENOUS

## 2019-07-16 MED ORDER — FENTANYL CITRATE (PF) 100 MCG/2ML IJ SOLN
25.0000 ug | INTRAMUSCULAR | Status: DC | PRN
  Administered 2019-07-16 (×2): 50 ug via INTRAVENOUS

## 2019-07-16 MED ORDER — HYDROMORPHONE HCL 1 MG/ML IJ SOLN
0.5000 mg | INTRAMUSCULAR | Status: DC | PRN
  Administered 2019-07-17 – 2019-07-24 (×15): 1 mg via INTRAVENOUS
  Filled 2019-07-16 (×6): qty 1

## 2019-07-16 MED ORDER — ASPIRIN 325 MG PO TABS
325.0000 mg | ORAL_TABLET | Freq: Every day | ORAL | Status: DC
  Administered 2019-07-17 – 2019-07-25 (×8): 325 mg via ORAL
  Filled 2019-07-16 (×8): qty 1

## 2019-07-16 MED ORDER — ACETAMINOPHEN 325 MG PO TABS
650.0000 mg | ORAL_TABLET | Freq: Once | ORAL | Status: AC
  Administered 2019-07-16: 650 mg via ORAL
  Filled 2019-07-16: qty 2

## 2019-07-16 MED ORDER — ACETAMINOPHEN 325 MG PO TABS: 325.0000 mg | ORAL_TABLET | ORAL | Status: DC | PRN

## 2019-07-16 MED ORDER — ONDANSETRON HCL 4 MG/2ML IJ SOLN: 4.0000 mg | Freq: Once | INTRAMUSCULAR | Status: DC | PRN

## 2019-07-16 MED ORDER — FENTANYL CITRATE (PF) 100 MCG/2ML IJ SOLN
INTRAMUSCULAR | Status: AC
  Filled 2019-07-16: qty 2

## 2019-07-16 MED ORDER — DEXAMETHASONE SODIUM PHOSPHATE 10 MG/ML IJ SOLN
INTRAMUSCULAR | Status: AC
  Filled 2019-07-16: qty 1

## 2019-07-16 MED ORDER — DEXTROSE 5 % IV SOLN
3.0000 g | INTRAVENOUS | Status: AC
  Administered 2019-07-16: 3 g via INTRAVENOUS
  Filled 2019-07-16: qty 3

## 2019-07-16 MED ORDER — KETOROLAC TROMETHAMINE 15 MG/ML IJ SOLN
15.0000 mg | Freq: Four times a day (QID) | INTRAMUSCULAR | Status: AC
  Administered 2019-07-16 – 2019-07-18 (×6): 15 mg via INTRAVENOUS
  Filled 2019-07-16 (×6): qty 1

## 2019-07-16 MED ORDER — ACETAMINOPHEN 325 MG PO TABS: 325.0000 mg | ORAL_TABLET | Freq: Four times a day (QID) | ORAL | Status: DC | PRN

## 2019-07-16 MED ORDER — MIDAZOLAM HCL 2 MG/2ML IJ SOLN
INTRAMUSCULAR | Status: DC | PRN
  Administered 2019-07-16: 2 mg via INTRAVENOUS

## 2019-07-16 MED ORDER — MIDAZOLAM HCL 2 MG/2ML IJ SOLN
INTRAMUSCULAR | Status: AC
  Filled 2019-07-16: qty 2

## 2019-07-16 MED ORDER — FENTANYL CITRATE (PF) 250 MCG/5ML IJ SOLN
INTRAMUSCULAR | Status: AC
  Filled 2019-07-16: qty 5

## 2019-07-16 MED ORDER — OXYCODONE HCL 5 MG PO TABS: 5.0000 mg | ORAL_TABLET | Freq: Once | ORAL | Status: DC | PRN

## 2019-07-16 MED ORDER — OXYCODONE HCL 5 MG PO TABS
5.0000 mg | ORAL_TABLET | ORAL | Status: DC | PRN
  Administered 2019-07-18: 10 mg via ORAL
  Administered 2019-07-19: 5 mg via ORAL
  Administered 2019-07-19 – 2019-07-24 (×10): 10 mg via ORAL
  Filled 2019-07-16 (×12): qty 2

## 2019-07-16 MED ORDER — BISACODYL 10 MG RE SUPP: 10.0000 mg | Freq: Every day | RECTAL | Status: DC | PRN

## 2019-07-16 MED ORDER — ONDANSETRON HCL 4 MG/2ML IJ SOLN
INTRAMUSCULAR | Status: AC
  Filled 2019-07-16: qty 2

## 2019-07-16 MED ORDER — SODIUM CHLORIDE 0.9% IV SOLUTION: Freq: Once | INTRAVENOUS | Status: AC

## 2019-07-16 MED ORDER — METHOCARBAMOL 1000 MG/10ML IJ SOLN: 500.0000 mg | Freq: Four times a day (QID) | INTRAVENOUS | Status: DC | PRN

## 2019-07-16 MED ORDER — 0.9 % SODIUM CHLORIDE (POUR BTL) OPTIME
TOPICAL | Status: DC | PRN
  Administered 2019-07-16: 1000 mL

## 2019-07-16 MED ORDER — SUCCINYLCHOLINE CHLORIDE 200 MG/10ML IV SOSY
PREFILLED_SYRINGE | INTRAVENOUS | Status: AC
  Filled 2019-07-16: qty 10

## 2019-07-16 MED ORDER — PROPOFOL 10 MG/ML IV BOLUS
INTRAVENOUS | Status: DC | PRN
  Administered 2019-07-16: 200 mg via INTRAVENOUS

## 2019-07-16 MED ORDER — TRANEXAMIC ACID 1000 MG/10ML IV SOLN
INTRAVENOUS | Status: DC | PRN
  Administered 2019-07-16: 2000 mg via TOPICAL

## 2019-07-16 MED ORDER — DIPHENHYDRAMINE HCL 50 MG/ML IJ SOLN
25.0000 mg | Freq: Once | INTRAMUSCULAR | Status: AC
  Administered 2019-07-16: 25 mg via INTRAVENOUS
  Filled 2019-07-16: qty 1

## 2019-07-16 MED ORDER — LACTATED RINGERS IV SOLN: INTRAVENOUS | Status: DC

## 2019-07-16 SURGICAL SUPPLY — 36 items
BLADE SURG 21 STRL SS (BLADE) ×3 IMPLANT
BNDG COHESIVE 6X5 TAN STRL LF (GAUZE/BANDAGES/DRESSINGS) IMPLANT
BNDG GAUZE ELAST 4 BULKY (GAUZE/BANDAGES/DRESSINGS) ×6 IMPLANT
CANISTER WOUNDNEG PRESSURE 500 (CANNISTER) ×2 IMPLANT
COVER SURGICAL LIGHT HANDLE (MISCELLANEOUS) ×6 IMPLANT
COVER WAND RF STERILE (DRAPES) ×3 IMPLANT
DRAPE U-SHAPE 47X51 STRL (DRAPES) ×3 IMPLANT
DRESSING PREVENA PLUS CUSTOM (GAUZE/BANDAGES/DRESSINGS) IMPLANT
DRSG ADAPTIC 3X8 NADH LF (GAUZE/BANDAGES/DRESSINGS) ×3 IMPLANT
DRSG PREVENA PLUS CUSTOM (GAUZE/BANDAGES/DRESSINGS) ×3
DURAPREP 26ML APPLICATOR (WOUND CARE) ×3 IMPLANT
ELECT REM PT RETURN 9FT ADLT (ELECTROSURGICAL)
ELECTRODE REM PT RTRN 9FT ADLT (ELECTROSURGICAL) IMPLANT
GAUZE SPONGE 4X4 12PLY STRL (GAUZE/BANDAGES/DRESSINGS) ×3 IMPLANT
GLOVE BIOGEL PI IND STRL 9 (GLOVE) ×1 IMPLANT
GLOVE BIOGEL PI INDICATOR 9 (GLOVE) ×2
GLOVE SURG ORTHO 9.0 STRL STRW (GLOVE) ×3 IMPLANT
GOWN STRL REUS W/ TWL XL LVL3 (GOWN DISPOSABLE) ×2 IMPLANT
GOWN STRL REUS W/TWL XL LVL3 (GOWN DISPOSABLE) ×4
HANDPIECE INTERPULSE COAX TIP (DISPOSABLE)
KIT BASIN OR (CUSTOM PROCEDURE TRAY) ×3 IMPLANT
KIT TURNOVER KIT B (KITS) ×3 IMPLANT
MANIFOLD NEPTUNE II (INSTRUMENTS) ×3 IMPLANT
NS IRRIG 1000ML POUR BTL (IV SOLUTION) ×3 IMPLANT
PACK ORTHO EXTREMITY (CUSTOM PROCEDURE TRAY) ×3 IMPLANT
PAD ARMBOARD 7.5X6 YLW CONV (MISCELLANEOUS) ×6 IMPLANT
SET HNDPC FAN SPRY TIP SCT (DISPOSABLE) IMPLANT
STOCKINETTE IMPERVIOUS 9X36 MD (GAUZE/BANDAGES/DRESSINGS) IMPLANT
SUT ETHILON 2 0 PSLX (SUTURE) ×8 IMPLANT
SUT SILK 2 0 TIES 10X30 (SUTURE) ×2 IMPLANT
SWAB COLLECTION DEVICE MRSA (MISCELLANEOUS) ×3 IMPLANT
SWAB CULTURE ESWAB REG 1ML (MISCELLANEOUS) IMPLANT
TOWEL GREEN STERILE (TOWEL DISPOSABLE) ×3 IMPLANT
TUBE CONNECTING 12'X1/4 (SUCTIONS) ×1
TUBE CONNECTING 12X1/4 (SUCTIONS) ×2 IMPLANT
YANKAUER SUCT BULB TIP NO VENT (SUCTIONS) ×3 IMPLANT

## 2019-07-16 NOTE — Op Note (Signed)
07/16/2019  2:57 PM  PATIENT:  Robert Sullivan    PRE-OPERATIVE DIAGNOSIS:  ABSCESS LEFT HIP  POST-OPERATIVE DIAGNOSIS:  Same  PROCEDURE:  LEFT HIP DEBRIDEMENT Excision skin soft tissue muscle and fascia. Tissue sent for cultures. Local tissue rearrangement for wound closure 45 x 15 cm. Application of customizable and [employer] wound VAC.  SURGEON:  Newt Minion, MD  PHYSICIAN ASSISTANT:None ANESTHESIA:   General  PREOPERATIVE INDICATIONS:  Robert Sullivan is a  54 y.o. male with a diagnosis of ABSCESS LEFT HIP who failed conservative measures and elected for surgical management.    The risks benefits and alternatives were discussed with the patient preoperatively including but not limited to the risks of infection, bleeding, nerve injury, cardiopulmonary complications, the need for revision surgery, among others, and the patient was willing to proceed.  OPERATIVE IMPLANTS: Wound VAC dressing sponges x2  @ENCIMAGES @  OPERATIVE FINDINGS: New necrotic muscle and no abscess tissue was sent for repeat cultures.  OPERATIVE PROCEDURE: Patient was brought the operating room underwent a general anesthetic.  After adequate levels anesthesia were obtained patient was placed in the right lateral decubitus position with the left side up in the left lower extremity was prepped using DuraPrep draped into a sterile field a timeout was called.  A football shaped incision was made around the previous surgical incision approximately 2 cm of soft tissue was resected from the wound there was a large deep wound hematoma.  This tissue was sent for cultures.  The tensor fascia lata had necrotic muscle this was resected electrocautery and silk was used for hemostasis.  The wound was irrigated with normal saline and TXA was used both IV and topically for further hemostasis.  Final wound was 45 x 15 cm.  All fascia muscle and soft tissue was healthy viable and was not in previous contact with the previous wound.   Local tissue rearrangement with 2-0 nylon was used to close the wound 45 x 15 cm.  A customizable VAC sponge was applied covered with a Arthur form VAC dressing.  This had a good suction fit patient was extubated taken the PACU in stable condition.   DISCHARGE PLANNING:  Antibiotic duration: Continue IV antibiotics previous cultures were positive for Enterobacter cloacae.  New cultures pending  Weightbearing: Weightbearing as tolerated on the left  Pain medication: Opioid pathway  Dressing care/ Wound VAC: Incisional wound VAC  Ambulatory devices: Walker or crutches  Discharge to: Continue hospitalization until cultures are finalized.  Follow-up: In the office 1 week post operative.

## 2019-07-16 NOTE — Telephone Encounter (Signed)
Patient's wife Sharyn Lull called stating that she needs a phone call from Dr. Sharol Given ASAP concerning Rx for pain.  Patient had surgery left Hip surgery today and is currently at the hospital.  Cb# is [phone].  Please advise.  Thank you.

## 2019-07-16 NOTE — Plan of Care (Signed)
  Problem: Education: Goal: Knowledge of General Education information will improve Description: Including pain rating scale, medication(s)/side effects and non-pharmacologic comfort measures Outcome: Progressing   Problem: Nutrition: Goal: Adequate nutrition will be maintained Outcome: Progressing   Problem: Coping: Goal: Level of anxiety will decrease Outcome: Progressing   Problem: Pain Managment: Goal: General experience of comfort will improve Outcome: Progressing Note: Treated for pain multiple times today with oxycodone, dilaudid, robaxin   Problem: Safety: Goal: Ability to remain free from injury will improve Outcome: Progressing

## 2019-07-16 NOTE — Transfer of Care (Signed)
Immediate Anesthesia Transfer of Care Note  Patient: Natalie Leclaire  Procedure(s) Performed: LEFT HIP DEBRIDEMENT (Left Hip)  Patient Location: PACU  Anesthesia Type:General  Level of Consciousness: awake and alert   Airway & Oxygen Therapy: Patient Spontanous Breathing and Patient connected to face mask oxygen  Post-op Assessment: Report given to RN  Post vital signs: Reviewed and stable  Last Vitals:  Vitals Value Taken Time  BP 154/77 07/16/19 1427  Temp    Pulse 124 07/16/19 1427  Resp 13 07/16/19 1427  SpO2 95 % 07/16/19 1427  Vitals shown include unvalidated device data.  Last Pain:  Vitals:   07/16/19 1056  TempSrc: Oral  PainSc:       Patients Stated Pain Goal: 3 (94/37/00 5259)  Complications: No apparent anesthesia complications

## 2019-07-16 NOTE — Progress Notes (Signed)
PT Cancellation Note  Patient Details Name: Robert Sullivan MRN: [id number] DOB: 1965/11/20   Cancelled Treatment:    Reason Eval/Treat Not Completed: Patient declined, no reason specified. Pt reporting that he has been up all morning and has been walking around to the bathroom and back. Pt declining PT session at this time and reporting that he will be heading to OR in approximately one hour. PT will continue to f/u with pt acutely as available.    [place] 07/16/2019, 10:24 AM

## 2019-07-16 NOTE — Interval H&P Note (Signed)
History and Physical Interval Note:  07/16/2019 7:02 AM  Kaleen Mask  has presented today for surgery, with the diagnosis of ABSCESS LEFT HIP.  The various methods of treatment have been discussed with the patient and family. After consideration of risks, benefits and other options for treatment, the patient has consented to  Procedure(s): LEFT HIP DEBRIDEMENT (Left) as a surgical intervention.  The patient's history has been reviewed, patient examined, no change in status, stable for surgery.  I have reviewed the patient's chart and labs.  Questions were answered to the patient's satisfaction.     Newt Minion

## 2019-07-16 NOTE — Progress Notes (Signed)
Spoke with Dr. Sharol Given, wife had called him very upset about pt's pain after surgery. Pt received 30m IV diluadid upon returning to the unit, pt is now comfortable. Dr. DSharol Givengave orders to give IV toradol [id number] for 6 doses to add on for his pain management as well. Also mentioned to him that the pre medications prior to starting the blood transfusion were never given by the PACU nurse, I gave tylenol once he got back to the floor and will give the IV benadryl when 1st unit of blood is finished. Dr. DuSharol Givenkay with that.   Will continue to monitor.

## 2019-07-16 NOTE — Progress Notes (Signed)
Elevated HR while up. HR in 150's notified by telemetry. Robert Sullivan Persons-PA notified, no new orders. Will continue to monitor.   Vital Signs MEWS/VS Documentation      07/15/2019 1919 07/15/2019 1930 07/16/2019 0249 07/16/2019 0700   MEWS Score:  1  1  0  3   MEWS Score Color:  Green  Green  Green  Yellow   Resp:  18  --  --  --   Pulse:  (!) 101  --  98  --   BP:  139/83  --  137/75  --   Temp:  97.9 F (36.6 C)  --  97.7 F (36.5 C)  --   O2 Device:  Room Air  --  Room Air  --   Level of Consciousness:  --  Alert  --  --           [place] 07/16/2019,8:01 AM

## 2019-07-16 NOTE — Anesthesia Preprocedure Evaluation (Signed)
Anesthesia Evaluation  Patient identified by MRN, date of birth, ID band Patient awake    Reviewed: Allergy & Precautions, H&P , NPO status , Patient's Chart, lab work & pertinent test results  Airway Mallampati: II   Neck ROM: full    Dental   Pulmonary sleep apnea ,    breath sounds clear to auscultation       Cardiovascular negative cardio ROS   Rhythm:regular Rate:Normal     Neuro/Psych  Headaches, negative psych ROS   GI/Hepatic negative GI ROS, Neg liver ROS,   Endo/Other  diabetes, Type 2Morbid obesity  Renal/GU negative Renal ROS  negative genitourinary   Musculoskeletal  (+) Arthritis ,   Abdominal   Peds  Hematology  (+) Blood dyscrasia, anemia ,   Anesthesia Other Findings   Reproductive/Obstetrics negative OB ROS                             Anesthesia Physical  Anesthesia Plan  ASA: III  Anesthesia Plan: General   Post-op Pain Management:    Induction: Intravenous  PONV Risk Score and Plan: 2 and Ondansetron, Dexamethasone, Midazolam and Treatment may vary due to age or medical condition  Airway Management Planned: Oral ETT and LMA  Additional Equipment:   Intra-op Plan:   Post-operative Plan: Extubation in OR  Informed Consent: I have reviewed the patients History and Physical, chart, labs and discussed the procedure including the risks, benefits and alternatives for the proposed anesthesia with the patient or authorized representative who has indicated his/her understanding and acceptance.       Plan Discussed with: CRNA, Anesthesiologist and Surgeon  Anesthesia Plan Comments:         Anesthesia Quick Evaluation

## 2019-07-16 NOTE — Anesthesia Procedure Notes (Signed)
Procedure Name: Intubation Date/Time: 07/16/2019 1:13 PM Performed by: Barrington Ellison, CRNA Pre-anesthesia Checklist: Patient identified, Emergency Drugs available, Suction available and Patient being monitored Patient Re-evaluated:Patient Re-evaluated prior to induction Oxygen Delivery Method: Circle System Utilized Preoxygenation: Pre-oxygenation with 100% oxygen Induction Type: IV induction Ventilation: Mask ventilation without difficulty Laryngoscope Size: Mac and 4 Grade View: Grade I Tube type: Oral Tube size: 7.5 mm Number of attempts: 1 Airway Equipment and Method: Stylet and Oral airway Placement Confirmation: ETT inserted through vocal cords under direct vision,  positive ETCO2 and breath sounds checked- equal and bilateral Secured at: 22 cm Tube secured with: Tape Dental Injury: Teeth and Oropharynx as per pre-operative assessment

## 2019-07-16 NOTE — Telephone Encounter (Signed)
This pt is s/p a left hip debridement today. Please see message below

## 2019-07-16 NOTE — Plan of Care (Signed)

## 2019-07-16 NOTE — Anesthesia Postprocedure Evaluation (Signed)
Anesthesia Post Note  Patient: Robert Sullivan  Procedure(s) Performed: LEFT HIP DEBRIDEMENT (Left Hip)     Anesthesia Post Evaluation  Last Vitals:  Vitals:   07/16/19 1615 07/16/19 1616  BP: 125/73 129/73  Pulse: (!) 103 (!) 102  Resp: 18   Temp: (!) 36.4 C   SpO2: 100%     Last Pain:  Vitals:   07/16/19 1627  TempSrc:   PainSc: 10-Worst pain ever                 Albertha Beattie

## 2019-07-17 ENCOUNTER — Other Ambulatory Visit: Payer: Self-pay | Admitting: Physician Assistant

## 2019-07-17 ENCOUNTER — Telehealth: Payer: Self-pay | Admitting: Orthopedic Surgery

## 2019-07-17 ENCOUNTER — Inpatient Hospital Stay (INDEPENDENT_AMBULATORY_CARE_PROVIDER_SITE_OTHER): Payer: Self-pay | Admitting: Primary Care

## 2019-07-17 LAB — TYPE AND SCREEN
ABO/RH(D): A POS
Antibody Screen: NEGATIVE
Unit division: 0
Unit division: 0
Unit division: 0
Unit division: 0
Unit division: 0
Unit division: 0
Unit division: 0

## 2019-07-17 LAB — CBC
HCT: 24.1 % — ABNORMAL LOW (ref 39.0–52.0)
HCT: 31.9 % — ABNORMAL LOW (ref 39.0–52.0)
Hemoglobin: 7.8 g/dL — ABNORMAL LOW (ref 13.0–17.0)
Hemoglobin: 10.2 g/dL — ABNORMAL LOW (ref 13.0–17.0)
MCH: 30.5 pg (ref 26.0–34.0)
MCH: 30.1 pg (ref 26.0–34.0)
MCHC: 32.4 g/dL (ref 30.0–36.0)
MCHC: 32 g/dL (ref 30.0–36.0)
MCV: 94.1 fL (ref 80.0–100.0)
MCV: 94.1 fL (ref 80.0–100.0)
Platelets: 84 10*3/uL — ABNORMAL LOW (ref 150–400)
Platelets: 117 10*3/uL — ABNORMAL LOW (ref 150–400)
RBC: 2.56 MIL/uL — ABNORMAL LOW (ref 4.22–5.81)
RBC: 3.39 MIL/uL — ABNORMAL LOW (ref 4.22–5.81)
RDW: 18 % — ABNORMAL HIGH (ref 11.5–15.5)
RDW: 17.8 % — ABNORMAL HIGH (ref 11.5–15.5)
WBC: 7.4 10*3/uL (ref 4.0–10.5)
WBC: 10.6 10*3/uL — ABNORMAL HIGH (ref 4.0–10.5)
nRBC: 0 % (ref 0.0–0.2)
nRBC: 0 % (ref 0.0–0.2)

## 2019-07-17 LAB — BPAM RBC
Blood Product Expiration Date: 202101062359
Blood Product Expiration Date: 202101172359
Blood Product Expiration Date: 202101222359
Blood Product Expiration Date: 202101262359
Blood Product Expiration Date: 202101282359
Blood Product Expiration Date: 202101282359
Blood Product Expiration Date: 202101282359
ISSUE DATE / TIME: 202101030902
ISSUE DATE / TIME: 202101031218
ISSUE DATE / TIME: 202101051004
ISSUE DATE / TIME: 202101051602
ISSUE DATE / TIME: 202101061409
ISSUE DATE / TIME: 202101061811
Unit Type and Rh: 6200
Unit Type and Rh: 6200
Unit Type and Rh: 6200
Unit Type and Rh: 6200
Unit Type and Rh: 6200
Unit Type and Rh: 6200
Unit Type and Rh: 6200

## 2019-07-17 LAB — GLUCOSE, CAPILLARY
Glucose-Capillary: 125 mg/dL — ABNORMAL HIGH (ref 70–99)
Glucose-Capillary: 149 mg/dL — ABNORMAL HIGH (ref 70–99)
Glucose-Capillary: 122 mg/dL — ABNORMAL HIGH (ref 70–99)
Glucose-Capillary: 147 mg/dL — ABNORMAL HIGH (ref 70–99)

## 2019-07-17 LAB — PREPARE RBC (CROSSMATCH)

## 2019-07-17 LAB — BASIC METABOLIC PANEL
Anion gap: 5 (ref 5–15)
BUN: 19 mg/dL (ref 6–20)
CO2: 21 mmol/L — ABNORMAL LOW (ref 22–32)
Calcium: 7.7 mg/dL — ABNORMAL LOW (ref 8.9–10.3)
Chloride: 109 mmol/L (ref 98–111)
Creatinine, Ser: 0.84 mg/dL (ref 0.61–1.24)
GFR calc Af Amer: >60 mL/min (ref >60)
GFR calc non Af Amer: >60 mL/min (ref >60)
Glucose, Bld: 140 mg/dL — ABNORMAL HIGH (ref 70–99)
Potassium: 4.4 mmol/L (ref 3.5–5.1)
Sodium: 135 mmol/L (ref 135–145)

## 2019-07-17 MED ORDER — ACETAMINOPHEN 325 MG PO TABS
650.0000 mg | ORAL_TABLET | Freq: Once | ORAL | Status: AC
  Administered 2019-07-17: 650 mg via ORAL
  Filled 2019-07-17: qty 2

## 2019-07-17 MED ORDER — SODIUM CHLORIDE 0.9% IV SOLUTION: Freq: Once | INTRAVENOUS | Status: AC

## 2019-07-17 MED ORDER — SIMETHICONE 80 MG PO CHEW
80.0000 mg | CHEWABLE_TABLET | Freq: Four times a day (QID) | ORAL | Status: DC | PRN
  Administered 2019-07-17 – 2019-07-22 (×9): 80 mg via ORAL
  Filled 2019-07-17 (×10): qty 1

## 2019-07-17 MED ORDER — DIPHENHYDRAMINE HCL 25 MG PO CAPS
25.0000 mg | ORAL_CAPSULE | Freq: Once | ORAL | Status: AC
  Administered 2019-07-17: 25 mg via ORAL
  Filled 2019-07-17: qty 1

## 2019-07-17 NOTE — H&P (View-Only) (Signed)
Patient ID: Robert Sullivan, male   DOB: 1965-09-08, 54 y.o.   MRN: [id number] Patient is postoperative day 1 repeat irrigation and debridement left hip and thigh abscess.  Hemoglobin 7.8 white blood cell count 7.4 there is 200 cc in the wound VAC canister.  We will plan to type and cross and transfuse with 2 units of packed red blood cells today plan to return to the operating room tomorrow for repeat irrigation and debridement.

## 2019-07-17 NOTE — Progress Notes (Signed)
OT Cancellation Note  Patient Details Name: Robert Sullivan MRN: [id number] DOB: 10-04-1965   Cancelled Treatment:    Reason Eval/Treat Not Completed: Patient declined, no reason specified(Pt awaiting breakfast, unable to lift LLE and in severe pain)  Pt reports frustration with his LLE not moving well s/p sx last night. Pt stating "maybe later." OT to continue to follow.  Jefferey Pica OTR/L Acute Rehabilitation Services Pager: [phone] Office: [phone]   Cristino Degroff C 07/17/2019, 8:46 AM

## 2019-07-17 NOTE — Telephone Encounter (Signed)
Please see below.

## 2019-07-17 NOTE — Plan of Care (Signed)

## 2019-07-17 NOTE — Progress Notes (Addendum)
PT Cancellation Note  Patient Details Name: Robert Sullivan MRN: [id number] DOB: April 02, 1966   Cancelled Treatment:    Reason Eval/Treat Not Completed: Patient declined, no reason specified(pt reports he cannot mobilize at this time due to having "lots of meds" and "not feeling it")  Attempted 2nd time at 1251 and pt receiving blood and again denied mobility this date. Noted planned return to OR tomorrow.  Chelbie Jarnagin B Amedeo Detweiler 07/17/2019, 10:55 AM Bayard Males, PT Acute Rehabilitation Services Pager: [phone] Office: [phone]

## 2019-07-17 NOTE — Telephone Encounter (Signed)
Patient wife called stated that this call was pertaining to procedure tomorrow and she states its urgent she speaks with some one in regards to this.  Please call her asap.  [phone] Sharyn Lull

## 2019-07-17 NOTE — Progress Notes (Addendum)
Blood administration complete @ 2100. VS obtain, pt stable. 133m drainage from wound vac. Will continue to monitor.

## 2019-07-17 NOTE — Plan of Care (Signed)

## 2019-07-17 NOTE — Progress Notes (Signed)
Patient ID: Robert Sullivan, male   DOB: 10-30-1965, 54 y.o.   MRN: [id number] Patient is postoperative day 1 repeat irrigation and debridement left hip and thigh abscess.  Hemoglobin 7.8 white blood cell count 7.4 there is 200 cc in the wound VAC canister.  We will plan to type and cross and transfuse with 2 units of packed red blood cells today plan to return to the operating room tomorrow for repeat irrigation and debridement.

## 2019-07-18 ENCOUNTER — Inpatient Hospital Stay (HOSPITAL_COMMUNITY): Payer: Self-pay | Admitting: Certified Registered Nurse Anesthetist

## 2019-07-18 ENCOUNTER — Encounter (HOSPITAL_COMMUNITY)
Admission: RE | Disposition: A | Discharge status: Home / Self Care | Payer: Self-pay | Source: Intra-hospital | Attending: Orthopedic Surgery

## 2019-07-18 ENCOUNTER — Encounter (HOSPITAL_COMMUNITY): Payer: Self-pay | Admitting: Orthopedic Surgery

## 2019-07-18 HISTORY — PX: I & D EXTREMITY: SHX5045

## 2019-07-18 LAB — GLUCOSE, CAPILLARY
Glucose-Capillary: 130 mg/dL — ABNORMAL HIGH (ref 70–99)
Glucose-Capillary: 114 mg/dL — ABNORMAL HIGH (ref 70–99)
Glucose-Capillary: 122 mg/dL — ABNORMAL HIGH (ref 70–99)
Glucose-Capillary: 148 mg/dL — ABNORMAL HIGH (ref 70–99)
Glucose-Capillary: 195 mg/dL — ABNORMAL HIGH (ref 70–99)

## 2019-07-18 LAB — CBC
HCT: 27.5 % — ABNORMAL LOW (ref 39.0–52.0)
Hemoglobin: 8.9 g/dL — ABNORMAL LOW (ref 13.0–17.0)
MCH: 30.2 pg (ref 26.0–34.0)
MCHC: 32.4 g/dL (ref 30.0–36.0)
MCV: 93.2 fL (ref 80.0–100.0)
Platelets: 79 10*3/uL — ABNORMAL LOW (ref 150–400)
RBC: 2.95 MIL/uL — ABNORMAL LOW (ref 4.22–5.81)
RDW: 17.6 % — ABNORMAL HIGH (ref 11.5–15.5)
WBC: 7.1 10*3/uL (ref 4.0–10.5)
nRBC: 0 % (ref 0.0–0.2)

## 2019-07-18 SURGERY — IRRIGATION AND DEBRIDEMENT EXTREMITY
Anesthesia: General | Laterality: Left

## 2019-07-18 MED ORDER — OXYCODONE HCL 5 MG PO TABS
ORAL_TABLET | ORAL | Status: AC
  Filled 2019-07-18: qty 1

## 2019-07-18 MED ORDER — DEXAMETHASONE SODIUM PHOSPHATE 10 MG/ML IJ SOLN
INTRAMUSCULAR | Status: AC
  Filled 2019-07-18: qty 1

## 2019-07-18 MED ORDER — FENTANYL CITRATE (PF) 250 MCG/5ML IJ SOLN
INTRAMUSCULAR | Status: AC
  Filled 2019-07-18: qty 5

## 2019-07-18 MED ORDER — ONDANSETRON HCL 4 MG/2ML IJ SOLN
INTRAMUSCULAR | Status: DC | PRN
  Administered 2019-07-18: 4 mg via INTRAVENOUS

## 2019-07-18 MED ORDER — HYDROCORTISONE NA SUCCINATE PF 250 MG IJ SOLR
INTRAMUSCULAR | Status: AC
  Filled 2019-07-18: qty 250

## 2019-07-18 MED ORDER — DIPHENHYDRAMINE HCL 50 MG/ML IJ SOLN
INTRAMUSCULAR | Status: AC
  Filled 2019-07-18: qty 1

## 2019-07-18 MED ORDER — HYDROMORPHONE HCL 1 MG/ML IJ SOLN: 0.2500 mg | INTRAMUSCULAR | Status: DC | PRN

## 2019-07-18 MED ORDER — PHENYLEPHRINE 40 MCG/ML (10ML) SYRINGE FOR IV PUSH (FOR BLOOD PRESSURE SUPPORT)
PREFILLED_SYRINGE | INTRAVENOUS | Status: DC | PRN
  Administered 2019-07-18: 120 ug via INTRAVENOUS
  Administered 2019-07-18: 240 ug via INTRAVENOUS
  Administered 2019-07-18: 200 ug via INTRAVENOUS

## 2019-07-18 MED ORDER — VASOPRESSIN 20 UNIT/ML IV SOLN
INTRAVENOUS | Status: AC
  Filled 2019-07-18: qty 1

## 2019-07-18 MED ORDER — MAGNESIUM CITRATE PO SOLN
1.0000 | Freq: Once | ORAL | Status: AC
  Administered 2019-07-18: 1 via ORAL
  Filled 2019-07-18: qty 296

## 2019-07-18 MED ORDER — CHLORHEXIDINE GLUCONATE 4 % EX LIQD: 60.0000 mL | Freq: Once | CUTANEOUS | Status: DC

## 2019-07-18 MED ORDER — DEXAMETHASONE SODIUM PHOSPHATE 4 MG/ML IJ SOLN
INTRAMUSCULAR | Status: DC | PRN
  Administered 2019-07-18: 5 mg via INTRAVENOUS

## 2019-07-18 MED ORDER — PHENYLEPHRINE HCL-NACL 10-0.9 MG/250ML-% IV SOLN: INTRAVENOUS | Status: DC | PRN

## 2019-07-18 MED ORDER — ONDANSETRON HCL 4 MG/2ML IJ SOLN
INTRAMUSCULAR | Status: AC
  Filled 2019-07-18: qty 2

## 2019-07-18 MED ORDER — LACTATED RINGERS IV SOLN: INTRAVENOUS | Status: DC | PRN

## 2019-07-18 MED ORDER — ACETAMINOPHEN 10 MG/ML IV SOLN
INTRAVENOUS | Status: AC
  Filled 2019-07-18: qty 100

## 2019-07-18 MED ORDER — 0.9 % SODIUM CHLORIDE (POUR BTL) OPTIME
TOPICAL | Status: DC | PRN
  Administered 2019-07-18: 1000 mL

## 2019-07-18 MED ORDER — VASOPRESSIN 20 UNIT/ML IV SOLN
INTRAVENOUS | Status: DC | PRN
  Administered 2019-07-18 (×3): 2 [IU] via INTRAVENOUS

## 2019-07-18 MED ORDER — ALBUMIN HUMAN 5 % IV SOLN: INTRAVENOUS | Status: DC | PRN

## 2019-07-18 MED ORDER — SODIUM CHLORIDE 0.9 % IR SOLN
Status: DC | PRN
  Administered 2019-07-18: 3000 mL

## 2019-07-18 MED ORDER — OXYCODONE HCL 5 MG PO TABS
5.0000 mg | ORAL_TABLET | Freq: Once | ORAL | Status: AC | PRN
  Administered 2019-07-18: 5 mg via ORAL

## 2019-07-18 MED ORDER — MIDAZOLAM HCL 2 MG/2ML IJ SOLN
INTRAMUSCULAR | Status: AC
  Filled 2019-07-18: qty 2

## 2019-07-18 MED ORDER — MEPERIDINE HCL 25 MG/ML IJ SOLN: 6.2500 mg | INTRAMUSCULAR | Status: DC | PRN

## 2019-07-18 MED ORDER — OXYCODONE HCL 5 MG/5ML PO SOLN: 5.0000 mg | Freq: Once | ORAL | Status: AC | PRN

## 2019-07-18 MED ORDER — DIPHENHYDRAMINE HCL 50 MG/ML IJ SOLN
INTRAMUSCULAR | Status: DC | PRN
  Administered 2019-07-18: 12.5 mg via INTRAVENOUS

## 2019-07-18 MED ORDER — PROPOFOL 10 MG/ML IV BOLUS
INTRAVENOUS | Status: DC | PRN
  Administered 2019-07-18: 180 mg via INTRAVENOUS

## 2019-07-18 MED ORDER — ONDANSETRON HCL 4 MG/2ML IJ SOLN: 4.0000 mg | Freq: Once | INTRAMUSCULAR | Status: DC | PRN

## 2019-07-18 MED ORDER — SUCCINYLCHOLINE CHLORIDE 200 MG/10ML IV SOSY
PREFILLED_SYRINGE | INTRAVENOUS | Status: DC | PRN
  Administered 2019-07-18: 140 mg via INTRAVENOUS

## 2019-07-18 MED ORDER — FENTANYL CITRATE (PF) 250 MCG/5ML IJ SOLN
INTRAMUSCULAR | Status: DC | PRN
  Administered 2019-07-18: 150 ug via INTRAVENOUS

## 2019-07-18 MED ORDER — LIDOCAINE 2% (20 MG/ML) 5 ML SYRINGE
INTRAMUSCULAR | Status: DC | PRN
  Administered 2019-07-18: 100 mg via INTRAVENOUS

## 2019-07-18 MED ORDER — MIDAZOLAM HCL 2 MG/2ML IJ SOLN
INTRAMUSCULAR | Status: DC | PRN
  Administered 2019-07-18: 2 mg via INTRAVENOUS

## 2019-07-18 MED ORDER — HYDROCORTISONE NA SUCCINATE PF 100 MG IJ SOLR
INTRAMUSCULAR | Status: DC | PRN
  Administered 2019-07-18: 100 mg via INTRAVENOUS

## 2019-07-18 MED ORDER — ACETAMINOPHEN 10 MG/ML IV SOLN
1000.0000 mg | Freq: Once | INTRAVENOUS | Status: DC | PRN
  Administered 2019-07-18: 1000 mg via INTRAVENOUS

## 2019-07-18 SURGICAL SUPPLY — 39 items
BLADE SURG 21 STRL SS (BLADE) ×3 IMPLANT
BNDG COHESIVE 6X5 TAN STRL LF (GAUZE/BANDAGES/DRESSINGS) IMPLANT
BNDG GAUZE ELAST 4 BULKY (GAUZE/BANDAGES/DRESSINGS) ×2 IMPLANT
COVER SURGICAL LIGHT HANDLE (MISCELLANEOUS) ×2 IMPLANT
COVER WAND RF STERILE (DRAPES) ×1 IMPLANT
DRAPE INCISE IOBAN 66X45 STRL (DRAPES) ×2 IMPLANT
DRAPE U-SHAPE 47X51 STRL (DRAPES) ×5 IMPLANT
DRESSING PREVENA PLUS CUSTOM (GAUZE/BANDAGES/DRESSINGS) IMPLANT
DRSG ADAPTIC 3X8 NADH LF (GAUZE/BANDAGES/DRESSINGS) ×1 IMPLANT
DRSG PREVENA PLUS CUSTOM (GAUZE/BANDAGES/DRESSINGS) ×3
DURAPREP 26ML APPLICATOR (WOUND CARE) ×5 IMPLANT
ELECT BLADE 4.0 EZ CLEAN MEGAD (MISCELLANEOUS) ×3
ELECT REM PT RETURN 9FT ADLT (ELECTROSURGICAL) ×3
ELECTRODE BLDE 4.0 EZ CLN MEGD (MISCELLANEOUS) IMPLANT
ELECTRODE REM PT RTRN 9FT ADLT (ELECTROSURGICAL) IMPLANT
GAUZE SPONGE 4X4 12PLY STRL (GAUZE/BANDAGES/DRESSINGS) ×1 IMPLANT
GLOVE BIOGEL PI IND STRL 9 (GLOVE) ×1 IMPLANT
GLOVE BIOGEL PI INDICATOR 9 (GLOVE) ×2
GLOVE SURG ORTHO 9.0 STRL STRW (GLOVE) ×3 IMPLANT
GOWN STRL REUS W/ TWL XL LVL3 (GOWN DISPOSABLE) ×2 IMPLANT
GOWN STRL REUS W/TWL XL LVL3 (GOWN DISPOSABLE) ×4
HANDPIECE INTERPULSE COAX TIP (DISPOSABLE) ×2
KIT BASIN OR (CUSTOM PROCEDURE TRAY) ×3 IMPLANT
KIT TURNOVER KIT B (KITS) ×3 IMPLANT
MANIFOLD NEPTUNE II (INSTRUMENTS) ×3 IMPLANT
NS IRRIG 1000ML POUR BTL (IV SOLUTION) ×3 IMPLANT
PACK ORTHO EXTREMITY (CUSTOM PROCEDURE TRAY) ×3 IMPLANT
PAD ARMBOARD 7.5X6 YLW CONV (MISCELLANEOUS) ×6 IMPLANT
SET HNDPC FAN SPRY TIP SCT (DISPOSABLE) IMPLANT
SPONGE LAP 18X18 RF (DISPOSABLE) ×4 IMPLANT
STAPLER VISISTAT (STAPLE) ×2 IMPLANT
STOCKINETTE IMPERVIOUS 9X36 MD (GAUZE/BANDAGES/DRESSINGS) ×2 IMPLANT
SUT ETHILON 2 0 PSLX (SUTURE) ×10 IMPLANT
SWAB COLLECTION DEVICE MRSA (MISCELLANEOUS) ×1 IMPLANT
SWAB CULTURE ESWAB REG 1ML (MISCELLANEOUS) IMPLANT
TOWEL GREEN STERILE (TOWEL DISPOSABLE) ×3 IMPLANT
TUBE CONNECTING 12'X1/4 (SUCTIONS) ×1
TUBE CONNECTING 12X1/4 (SUCTIONS) ×2 IMPLANT
YANKAUER SUCT BULB TIP NO VENT (SUCTIONS) ×3 IMPLANT

## 2019-07-18 NOTE — Plan of Care (Signed)

## 2019-07-18 NOTE — Interval H&P Note (Signed)
History and Physical Interval Note:  07/18/2019 7:00 AM  Robert Sullivan  has presented today for surgery, with the diagnosis of Abscess Left Hip.  The various methods of treatment have been discussed with the patient and family. After consideration of risks, benefits and other options for treatment, the patient has consented to  Procedure(s): REPEAT IRRIGATION AND DEBRIDEMENT LEFT HIP (Left) as a surgical intervention.  The patient's history has been reviewed, patient examined, no change in status, stable for surgery.  I have reviewed the patient's chart and labs.  Questions were answered to the patient's satisfaction.     Newt Minion

## 2019-07-18 NOTE — Anesthesia Preprocedure Evaluation (Signed)
Anesthesia Evaluation  Patient identified by MRN, date of birth, ID band Patient awake    Reviewed: Allergy & Precautions, NPO status , Patient's Chart, lab work & pertinent test results  Airway Mallampati: II  TM Distance: >3 FB Neck ROM: Full    Dental no notable dental hx. (+) Teeth Intact, Dental Advisory Given   Pulmonary sleep apnea and Continuous Positive Airway Pressure Ventilation ,    Pulmonary exam normal breath sounds clear to auscultation       Cardiovascular hypertension, Normal cardiovascular exam Rhythm:Regular Rate:Normal     Neuro/Psych    GI/Hepatic negative GI ROS, Neg liver ROS,   Endo/Other  diabetes  Renal/GU K+ 4.4 Cr 0.84     Musculoskeletal negative musculoskeletal ROS (+)   Abdominal   Peds  Hematology hgb 8.9 plt 79   Anesthesia Other Findings   Reproductive/Obstetrics negative OB ROS                             Anesthesia Physical Anesthesia Plan  ASA: IV  Anesthesia Plan: General   Post-op Pain Management:    Induction: Intravenous  PONV Risk Score and Plan: 3 and Treatment may vary due to age or medical condition, Ondansetron and Dexamethasone  Airway Management Planned: Oral ETT  Additional Equipment:   Intra-op Plan:   Post-operative Plan: Extubation in OR  Informed Consent: I have reviewed the patients History and Physical, chart, labs and discussed the procedure including the risks, benefits and alternatives for the proposed anesthesia with the patient or authorized representative who has indicated his/her understanding and acceptance.     Dental advisory given  Plan Discussed with: CRNA  Anesthesia Plan Comments:         Anesthesia Quick Evaluation

## 2019-07-18 NOTE — Op Note (Signed)
07/18/2019  3:22 PM  PATIENT:  Robert Sullivan    PRE-OPERATIVE DIAGNOSIS:  Abscess Left Hip  POST-OPERATIVE DIAGNOSIS:  Same  PROCEDURE:  REPEAT IRRIGATION AND DEBRIDEMENT LEFT HIP Excision of skin soft tissue muscle fascia including part of the gluteus maximus muscles. Local tissue rearrangement for wound closure 60 x 15 cm. Application of Prevena incisional wound VAC.  SURGEON:  Newt Minion, MD  PHYSICIAN ASSISTANT:None ANESTHESIA:   General  PREOPERATIVE INDICATIONS:  [name] is a  54 y.o. male with a diagnosis of Abscess Left Hip who failed conservative measures and elected for surgical management.    The risks benefits and alternatives were discussed with the patient preoperatively including but not limited to the risks of infection, bleeding, nerve injury, cardiopulmonary complications, the need for revision surgery, among others, and the patient was willing to proceed.  OPERATIVE IMPLANTS: Praveena incisional wound VAC.  @ENCIMAGES @  OPERATIVE FINDINGS: Nonviable gluteus maximus muscles as well as skin and soft tissue which was excised back to healthy viable contractile margins  OPERATIVE PROCEDURE: Patient was brought to the operating room and underwent a general anesthetic.  After adequate levels anesthesia were obtained patient was placed in the right lateral decubitus position with the left side up and the left lower extremity was prepped using DuraPrep draped into a sterile field a timeout was called.  Approximately a centimeter around the wound edges was excised to get back to healthy viable margins.  There was part of the gluteus maximus muscle that was not contractile did not have good color or consistency.  Part of the gluteus maximus muscles were excised.  The wound was irrigated with pulsatile lavage the remaining soft tissue was healthy and viable the muscle had good color consistency and contractility.  Local tissue rearrangement was used to close the wound that was  15 x 60 cm.  This was closed with staples and 2-0 nylon.  An incisional wound VAC was applied with the [place].  This had a good suction fit patient was extubated taken the PACU in stable condition.   DISCHARGE PLANNING:  Antibiotic duration: Patient will need discharge on 4 weeks IV antibiotics  Weightbearing: Weightbearing as tolerated  Pain medication: Opioid pathway  Dressing care/ Wound VAC: Continue wound VAC at discharge  Ambulatory devices: Walker  Discharge to: Home.  Follow-up: In the office 1 week post operative.

## 2019-07-18 NOTE — Progress Notes (Signed)
Physical Therapy Treatment Patient Details Name: Robert Sullivan MRN: [id number] DOB: 1965-10-17 Today's Date: 07/18/2019    History of Present Illness 54 year old male with recent history of fall and subsequent gluteal abscess.  He has undergone IR drainage and several surgical I&D.  repeat I&Ds 1/2, 1/6. He has been on IV antibiotics for approximately 1 month.  Was discharged about a week ago and now comes back with worsening edema and drainage from prior surgical site. PMH including but not limited to DM2, OA, septic shock, obesity, and arthritis.    PT Comments    Pt denied OOB due to getting up yesterday with blood oozing everywhere and pt not willing to attempt trunk elevation at this time. Pt agreeable to HEP and performed with encouragement to mobilize after sx with nursing.     Follow Up Recommendations  Home health PT     Equipment Recommendations  3in1 (PT)    Recommendations for Other Services       Precautions / Restrictions Precautions Precautions: Fall Precaution Comments: wound vac Restrictions Weight Bearing Restrictions: Yes LLE Weight Bearing: Weight bearing as tolerated    Mobility  Bed Mobility               General bed mobility comments: pt declined mobility due to pending return to OR  Transfers                    Ambulation/Gait                 Stairs             Wheelchair Mobility    Modified Rankin (Stroke Patients Only)       Balance                                            Cognition Arousal/Alertness: Awake/alert Behavior During Therapy: WFL for tasks assessed/performed Overall Cognitive Status: Within Functional Limits for tasks assessed                                        Exercises General Exercises - Lower Extremity Ankle Circles/Pumps: AROM;Left;Supine;Other reps (comment)(25 reps) Heel Slides: AROM;AAROM;Right;Left;Supine;15 reps(15 reps left , 25  right) Hip ABduction/ADduction: AROM;AAROM;Left;Supine;Right;15 reps(15 left, 25 right) Straight Leg Raises: AROM;AAROM;Right;Left;Supine;15 reps(15 left, 25 right)    General Comments        Pertinent Vitals/Pain Pain Score: 6  Pain Location: L buttocks Pain Descriptors / Indicators: Aching;Guarding Pain Intervention(s): Limited activity within patient's tolerance;Monitored during session;Repositioned    Home Living                      Prior Function            PT Goals (current goals can now be found in the care plan section) Progress towards PT goals: Progressing toward goals    Frequency    Min 3X/week      PT Plan Current plan remains appropriate    Co-evaluation              AM-PAC PT "6 Clicks" Mobility   Outcome Measure  Help needed turning from your back to your side while in a flat bed without using bedrails?: A Little Help needed moving from lying on your back  to sitting on the side of a flat bed without using bedrails?: A Little Help needed moving to and from a bed to a chair (including a wheelchair)?: A Little Help needed standing up from a chair using your arms (e.g., wheelchair or bedside chair)?: A Little Help needed to walk in hospital room?: A Little Help needed climbing 3-5 steps with a railing? : A Lot 6 Click Score: 17    End of Session   Activity Tolerance: Patient tolerated treatment well Patient left: in bed;with call bell/phone within reach Nurse Communication: Mobility status PT Visit Diagnosis: Other abnormalities of gait and mobility (R26.89);Muscle weakness (generalized) (M62.81);Difficulty in walking, not elsewhere classified (R26.2);Pain     Time: 4259-5638 PT Time Calculation (min) (ACUTE ONLY): 20 min  Charges:  $Therapeutic Exercise: 8-22 mins                     Robert Sullivan P, PT Acute Rehabilitation Services Pager: [phone] Office: [place] 07/18/2019, 12:26 PM

## 2019-07-18 NOTE — Progress Notes (Signed)
Came to room d/t bipap order.  I spoke w/ pt, he states he has cpap ordered at home but does not use it.  Pt is on [place] and in no distress.  I called the ordering MD, per Dr Valma Cava- he thought pt would possibly need bipap initially post-op but states pt does not need bipap now and order can be d/c.

## 2019-07-18 NOTE — Plan of Care (Signed)

## 2019-07-18 NOTE — Anesthesia Procedure Notes (Signed)
Procedure Name: Intubation Date/Time: 07/18/2019 2:21 PM Performed by: Kathryne Hitch, CRNA Pre-anesthesia Checklist: Patient identified, Emergency Drugs available, Suction available and Patient being monitored Patient Re-evaluated:Patient Re-evaluated prior to induction Oxygen Delivery Method: Circle system utilized Preoxygenation: Pre-oxygenation with 100% oxygen Induction Type: IV induction Ventilation: Mask ventilation without difficulty Laryngoscope Size: Miller and 3 Grade View: Grade I Tube type: Oral Tube size: 7.5 mm Number of attempts: 1 Airway Equipment and Method: Stylet and Oral airway Placement Confirmation: ETT inserted through vocal cords under direct vision,  positive ETCO2 and breath sounds checked- equal and bilateral Secured at: 23 cm Tube secured with: Tape Dental Injury: Teeth and Oropharynx as per pre-operative assessment

## 2019-07-18 NOTE — Progress Notes (Signed)
OT Cancellation Note  Patient Details Name: Robert Sullivan MRN: [id number] DOB: 17-May-1966   Cancelled Treatment:    Reason Eval/Treat Not Completed: Patient declined, no reason specified  [name] 07/18/2019, 12:31 PM  Nestor Lewandowsky, OTR/L Acute Rehabilitation Services Pager: [phone] Office: [phone]

## 2019-07-18 NOTE — Transfer of Care (Signed)
Immediate Anesthesia Transfer of Care Note  Patient: Robert Sullivan  Procedure(s) Performed: REPEAT IRRIGATION AND DEBRIDEMENT LEFT HIP (Left )  Patient Location: PACU  Anesthesia Type:General  Level of Consciousness: drowsy and patient cooperative  Airway & Oxygen Therapy: Patient Spontanous Breathing and Patient connected to face mask oxygen  Post-op Assessment: Report given to RN and Post -op Vital signs reviewed and stable  Post vital signs: Reviewed and stable  Last Vitals:  Vitals Value Taken Time  BP 142/72 07/18/19 1502  Temp    Pulse 104 07/18/19 1503  Resp 19 07/18/19 1503  SpO2 98 % 07/18/19 1503  Vitals shown include unvalidated device data.  Last Pain:  Vitals:   07/18/19 0851  TempSrc:   PainSc: 7       Patients Stated Pain Goal: 2 (66/59/93 5701)  Complications: No apparent anesthesia complications

## 2019-07-18 NOTE — Anesthesia Postprocedure Evaluation (Signed)
Anesthesia Post Note  Patient: Robert Sullivan  Procedure(s) Performed: REPEAT IRRIGATION AND DEBRIDEMENT LEFT HIP (Left )     Patient location during evaluation: PACU Anesthesia Type: General Level of consciousness: awake and alert Pain management: pain level controlled Vital Signs Assessment: post-procedure vital signs reviewed and stable Respiratory status: spontaneous breathing, nonlabored ventilation, respiratory function stable and patient connected to nasal cannula oxygen Cardiovascular status: blood pressure returned to baseline and stable Postop Assessment: no apparent nausea or vomiting Anesthetic complications: no    Last Vitals:  Vitals:   07/18/19 1545 07/18/19 1605  BP: 118/64 130/61  Pulse: 95 92  Resp: 13 16  Temp: 36.7 C 36.5 C  SpO2: 98% 97%    Last Pain:  Vitals:   07/18/19 1549  TempSrc:   PainSc: 4                  Barnet Glasgow

## 2019-07-19 LAB — GLUCOSE, CAPILLARY
Glucose-Capillary: 254 mg/dL — ABNORMAL HIGH (ref 70–99)
Glucose-Capillary: 214 mg/dL — ABNORMAL HIGH (ref 70–99)
Glucose-Capillary: 184 mg/dL — ABNORMAL HIGH (ref 70–99)
Glucose-Capillary: 174 mg/dL — ABNORMAL HIGH (ref 70–99)

## 2019-07-19 LAB — AEROBIC/ANAEROBIC CULTURE W GRAM STAIN (SURGICAL/DEEP WOUND)

## 2019-07-19 LAB — CBC
HCT: 21.1 % — ABNORMAL LOW (ref 39.0–52.0)
Hemoglobin: 6.7 g/dL — CL (ref 13.0–17.0)
MCH: 31 pg (ref 26.0–34.0)
MCHC: 31.8 g/dL (ref 30.0–36.0)
MCV: 97.7 fL (ref 80.0–100.0)
Platelets: 100 10*3/uL — ABNORMAL LOW (ref 150–400)
RBC: 2.16 MIL/uL — ABNORMAL LOW (ref 4.22–5.81)
RDW: 17.6 % — ABNORMAL HIGH (ref 11.5–15.5)
WBC: 7.1 10*3/uL (ref 4.0–10.5)
nRBC: 0 % (ref 0.0–0.2)

## 2019-07-19 LAB — BPAM PLATELET PHERESIS
Blood Product Expiration Date: 202101092359
ISSUE DATE / TIME: 202101081349
Unit Type and Rh: 6200

## 2019-07-19 LAB — PREPARE PLATELET PHERESIS: Unit division: 0

## 2019-07-19 LAB — PREPARE RBC (CROSSMATCH)

## 2019-07-19 MED ORDER — SODIUM CHLORIDE 0.9% IV SOLUTION: Freq: Once | INTRAVENOUS | Status: AC

## 2019-07-19 MED ORDER — ACETAMINOPHEN 325 MG PO TABS
650.0000 mg | ORAL_TABLET | Freq: Once | ORAL | Status: AC
  Administered 2019-07-19: 650 mg via ORAL
  Filled 2019-07-19: qty 2

## 2019-07-19 MED ORDER — DIPHENHYDRAMINE HCL 25 MG PO CAPS
25.0000 mg | ORAL_CAPSULE | Freq: Once | ORAL | Status: AC
  Administered 2019-07-19: 25 mg via ORAL
  Filled 2019-07-19: qty 1

## 2019-07-19 NOTE — Plan of Care (Signed)

## 2019-07-19 NOTE — Plan of Care (Signed)

## 2019-07-19 NOTE — Plan of Care (Signed)

## 2019-07-19 NOTE — Progress Notes (Addendum)
Patient ID: Robert Sullivan, male   DOB: [birth year], 54 y.o.   MRN: [id number] Patient is postoperative day 1 repeat excisional debridement left hip with further excision of nonviable gluteus muscles.  Patient has 250 cc of clear serosanguineous drainage in the wound VAC canister.  Patient's hemoglobin was 8.9 yesterday we will repeat hemoglobin today.  Patient's platelets were 79,000 and he did receive a platelet transfusion intraoperatively.  White blood cell count was 7.1.     Laboratory values from today are available his white cell count is unchanged at 7.1.  Hemoglobin has dropped to 6.7.  Will type and cross and transfuse with 2 units packed red blood cells.  Platelets are 100,000.

## 2019-07-20 LAB — BPAM RBC
Blood Product Expiration Date: 202101202359
Blood Product Expiration Date: 202101262359
Blood Product Expiration Date: 202101302359
Blood Product Expiration Date: 202101302359
ISSUE DATE / TIME: 202101071133
ISSUE DATE / TIME: 202101071641
ISSUE DATE / TIME: 202101091035
ISSUE DATE / TIME: 202101091610
Unit Type and Rh: 6200
Unit Type and Rh: 6200
Unit Type and Rh: 6200
Unit Type and Rh: 6200

## 2019-07-20 LAB — TYPE AND SCREEN
ABO/RH(D): A POS
Antibody Screen: NEGATIVE
Unit division: 0
Unit division: 0
Unit division: 0
Unit division: 0

## 2019-07-20 LAB — CBC
HCT: 24.4 % — ABNORMAL LOW (ref 39.0–52.0)
Hemoglobin: 7.9 g/dL — ABNORMAL LOW (ref 13.0–17.0)
MCH: 29.8 pg (ref 26.0–34.0)
MCHC: 32.4 g/dL (ref 30.0–36.0)
MCV: 92.1 fL (ref 80.0–100.0)
Platelets: 105 10*3/uL — ABNORMAL LOW (ref 150–400)
RBC: 2.65 MIL/uL — ABNORMAL LOW (ref 4.22–5.81)
RDW: 19.4 % — ABNORMAL HIGH (ref 11.5–15.5)
WBC: 7.2 10*3/uL (ref 4.0–10.5)
nRBC: 0 % (ref 0.0–0.2)

## 2019-07-20 LAB — GLUCOSE, CAPILLARY
Glucose-Capillary: 174 mg/dL — ABNORMAL HIGH (ref 70–99)
Glucose-Capillary: 155 mg/dL — ABNORMAL HIGH (ref 70–99)
Glucose-Capillary: 181 mg/dL — ABNORMAL HIGH (ref 70–99)
Glucose-Capillary: 158 mg/dL — ABNORMAL HIGH (ref 70–99)

## 2019-07-20 MED ORDER — MAGNESIUM CITRATE PO SOLN
1.0000 | Freq: Once | ORAL | Status: AC
  Administered 2019-07-20: 1 via ORAL
  Filled 2019-07-20: qty 296

## 2019-07-20 MED ORDER — BISMUTH SUBSALICYLATE 262 MG/15ML PO SUSP
30.0000 mL | ORAL | Status: DC | PRN
  Administered 2019-07-20 – 2019-07-23 (×5): 30 mL via ORAL
  Filled 2019-07-20: qty 236

## 2019-07-20 NOTE — Plan of Care (Signed)

## 2019-07-20 NOTE — Progress Notes (Signed)
  Subjective: Pt is a 54 y.o. Male who is POD2 s/p repeat excisional debridement of left hip. He states pain is controlled but complains of lightheadedness and increased fatigue.  He has ~250 cc of serosanguinous drainage in the Vac cannister.    Objective: Vital signs in last 24 hours: Temp:  [97.6 F (36.4 C)-98.2 F (36.8 C)] 97.6 F (36.4 C) (01/10 0735) Pulse Rate:  [84-99] 98 (01/10 0735) Resp:  [16-18] 16 (01/10 0356) BP: (108-142)/(62-76) 122/63 (01/10 0735) SpO2:  [99 %-100 %] 100 % (01/10 0735)  Intake/Output from previous day: 01/09 0701 - 01/10 0700 In: 2643 [P.O.:1680; Blood:663; IV Piggyback:300] Out: 2050 [Urine:1400; Drains:650] Intake/Output this shift: No intake/output data recorded.  Exam:  Dressing in place with suction intact DF/PF intact Left foot perfused  Labs: Recent Labs    07/17/19 2052 07/18/19 0318 07/19/19 0514 07/20/19 0346  HGB 10.2* 8.9* 6.7* 7.9*   Recent Labs    07/19/19 0514 07/20/19 0346  WBC 7.1 7.2  RBC 2.16* 2.65*  HCT 21.1* 24.4*  PLT 100* 105*   No results for input(s): NA, K, CL, CO2, BUN, CREATININE, GLUCOSE, CALCIUM in the last 72 hours. No results for input(s): LABPT, INR in the last 72 hours.  Assessment/Plan: Plan for discharge early next week.  He will need to be dc'ed with IV abx for 4 weeks.   Hgb improved to 7.9 as of 0346 this morning.     Shatavia Santor L Armonie Mettler 07/20/2019, 9:01 AM

## 2019-07-20 NOTE — Progress Notes (Signed)
OT Cancellation Note  Patient Details Name: Robert Sullivan MRN: [id number] DOB: 1966-06-04   Cancelled Treatment:    Reason Eval/Treat Not Completed: Patient declined, no reason specified. Will follow up at a later time if time allows. Thank you.  Minus Breeding, MSOT, OTR/L  Supplemental Rehabilitation Services  [phone]  [name]   07/20/2019, 9:29 AM

## 2019-07-20 NOTE — Plan of Care (Signed)

## 2019-07-20 NOTE — Progress Notes (Addendum)
14:00  Patient was sitting on toilet straining for 1 hour.  His heart rate intermittently went up to 160s according to [facility].  He was sweating profusely.  His canister on the wound vac was changed immediately prior to going to the bathroom.  After being on the toilet for 1 hour and straining, he completely filled the new canister on the wound vac.    Patient has been refusing scheduled colace.  According to patient, it brings on bowel movement to urgently and causes incontinence; however, he is requesting magnesium citrate because, "it works immediately."  He also has asked for prn simethicone and Pepto Bismol this shift for abdominal cramping/gas.  Patient was educated that these meds slow down the GI tract and are counter-effective to the laxative.  It was also suggested that colace was a less extreme laxative/stool softner and would help with regularity.  Patient refused education.  [place], PA was notified.

## 2019-07-21 ENCOUNTER — Telehealth: Payer: Self-pay | Admitting: Orthopedic Surgery

## 2019-07-21 LAB — CBC
HCT: 21.8 % — ABNORMAL LOW (ref 39.0–52.0)
Hemoglobin: 7 g/dL — ABNORMAL LOW (ref 13.0–17.0)
MCH: 30.4 pg (ref 26.0–34.0)
MCHC: 32.1 g/dL (ref 30.0–36.0)
MCV: 94.8 fL (ref 80.0–100.0)
Platelets: 109 10*3/uL — ABNORMAL LOW (ref 150–400)
RBC: 2.3 MIL/uL — ABNORMAL LOW (ref 4.22–5.81)
RDW: 19.5 % — ABNORMAL HIGH (ref 11.5–15.5)
WBC: 7.3 10*3/uL (ref 4.0–10.5)
nRBC: 0 % (ref 0.0–0.2)

## 2019-07-21 LAB — HEMOGLOBIN AND HEMATOCRIT, BLOOD
HCT: 23.5 % — ABNORMAL LOW (ref 39.0–52.0)
Hemoglobin: 7.4 g/dL — ABNORMAL LOW (ref 13.0–17.0)

## 2019-07-21 LAB — AEROBIC/ANAEROBIC CULTURE W GRAM STAIN (SURGICAL/DEEP WOUND): Culture: NO GROWTH

## 2019-07-21 LAB — GLUCOSE, CAPILLARY
Glucose-Capillary: 133 mg/dL — ABNORMAL HIGH (ref 70–99)
Glucose-Capillary: 132 mg/dL — ABNORMAL HIGH (ref 70–99)
Glucose-Capillary: 174 mg/dL — ABNORMAL HIGH (ref 70–99)
Glucose-Capillary: 146 mg/dL — ABNORMAL HIGH (ref 70–99)

## 2019-07-21 LAB — PREPARE RBC (CROSSMATCH)

## 2019-07-21 MED ORDER — SODIUM CHLORIDE 0.9 % IV SOLN: 2.0000 g | Freq: Three times a day (TID) | INTRAVENOUS | 2 refills | Status: AC

## 2019-07-21 MED ORDER — SODIUM CHLORIDE 0.9% IV SOLUTION: Freq: Once | INTRAVENOUS | Status: AC

## 2019-07-21 MED ORDER — CEFEPIME IV (FOR PTA / DISCHARGE USE ONLY): 2.0000 g | Freq: Three times a day (TID) | INTRAVENOUS | 0 refills | Status: AC

## 2019-07-21 NOTE — Progress Notes (Signed)
PT Cancellation Note  Patient Details Name: Laquentin Loudermilk MRN: [id number] DOB: 1965-08-17   Cancelled Treatment:    Reason Eval/Treat Not Completed: Other (comment)(pt awaiting blood and currently requesting therapy later in the day.)   Pinchas Reither B Moshe Wenger 07/21/2019, 11:05 AM  [place] Pager: [phone] Office: [phone]

## 2019-07-21 NOTE — Plan of Care (Signed)
  Problem: Clinical Measurements: Goal: Ability to maintain clinical measurements within normal limits will improve Outcome: Progressing Goal: Will remain free from infection Outcome: Progressing   Problem: Activity: Goal: Risk for activity intolerance will decrease Outcome: Progressing   Problem: Coping: Goal: Level of anxiety will decrease Outcome: Progressing   Problem: Pain Managment: Goal: General experience of comfort will improve Outcome: Progressing   Problem: Safety: Goal: Ability to remain free from injury will improve Outcome: Progressing   Problem: Skin Integrity: Goal: Risk for impaired skin integrity will decrease Outcome: Progressing

## 2019-07-21 NOTE — Telephone Encounter (Signed)
Patient's wife called.   Her husband is being discharged tomorrow and she is trying to prepare. She wants to know if he is coming home with a wound vac? & should she go ahead and contact PT and Homehealth service?   Call back number: [phone]

## 2019-07-21 NOTE — Progress Notes (Signed)
Wound vac canister changed. 479m of serosanguinous fluid in canister.

## 2019-07-21 NOTE — Progress Notes (Signed)
S/p I and D left Hip. Had BM x 3 yesterday. States that vac filled up when oob x 3. 250 currently in VAC.  Will recheck CBC today. TOC team for Home Health needs including IV Antibiotics. Hopefully discharge tomorrow if stable

## 2019-07-21 NOTE — Progress Notes (Signed)
OT Cancellation Note  Patient Details Name: Robert Sullivan MRN: [id number] DOB: 1965-11-28   Cancelled Treatment:    Reason Eval/Treat Not Completed: Patient at procedure or test/ unavailable;Other (comment).Pt receiving blood this afternoon. Second attempt to see pt today. This morning before pt was receiving blood, pt requested therapy return later. OT will try again next available time/date  [name] 07/21/2019, 2:20 PM

## 2019-07-22 LAB — TYPE AND SCREEN
ABO/RH(D): A POS
Antibody Screen: NEGATIVE
Unit division: 0

## 2019-07-22 LAB — GLUCOSE, CAPILLARY
Glucose-Capillary: 115 mg/dL — ABNORMAL HIGH (ref 70–99)
Glucose-Capillary: 136 mg/dL — ABNORMAL HIGH (ref 70–99)
Glucose-Capillary: 146 mg/dL — ABNORMAL HIGH (ref 70–99)
Glucose-Capillary: 115 mg/dL — ABNORMAL HIGH (ref 70–99)

## 2019-07-22 LAB — BPAM RBC
Blood Product Expiration Date: 202102042359
ISSUE DATE / TIME: 202101111238
Unit Type and Rh: 6200

## 2019-07-22 NOTE — Progress Notes (Addendum)
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 services. Pt without insurance. PTA pt was setup with [facility] for LT ABX therapy, and  RN was provided through [place].  Pt now requiring [place] RN and PT services which [facility] can only provide RN services. NCM also monitoring pt needs for regular wound vac vs prevena. Pt with  large drainage  from vac ( 400-500 ml per day) per bedside nurse.    TOC will continue monotr and foolow for TOC needs.... Whitman Hero RN,BSN,CM [phone]

## 2019-07-22 NOTE — Progress Notes (Signed)
VAST into room for CL care. Pt reported all of a sudden feeling bad; he complained of tight chest, feels like he is not getting enough oxygen and generally "bad". He appears pale in face and lips. [place] in nose at 2LPM. Pt stated he felt great 2 hours ago. He has not gotten out of bed or done anything to cause change in status. He ate breakfast. Sharyn Lull, pt's RN notified. NA in to check vitals. VSS.

## 2019-07-22 NOTE — Progress Notes (Signed)
Physical Therapy Treatment Patient Details Name: Robert Sullivan MRN: [id number] DOB: 09-05-1965 Today's Date: 07/22/2019    History of Present Illness 54 year old male with recent history of fall and subsequent gluteal abscess.  He has undergone IR drainage and several surgical I&D.  repeat I&Ds 1/2, 1/6, 1/8 with excision of necrotic gluteal. He has been on IV antibiotics for approximately 1 month. Returned after recent D/C with increased edema and drainage PMHx:DM2, OA, septic shock, obesity, and arthritis.    PT Comments    Pt pleasant and willing to mobilize this session with increased time for transfers and fatigue and SOB noted with activity. Pt with HR 125-155 with mobility. Pt also noted to have increased drainage without appropriate suction to distal 1/3 of wound VAC, RN present and aware who notified surgeon. Pt educated for stand hip ext and Abduction exercises as well as gait and progression. Pt with increased time for transfers and mobility stating fatigue with Hgb only 7.4 this date. Pt educated for modifications for home sleeping down stairs or in lift chair, assist for bed mobility without use of rail and progression of gait.     Follow Up Recommendations  Home health PT     Equipment Recommendations       Recommendations for Other Services       Precautions / Restrictions Precautions Precautions: Fall Precaution Comments: wound vac Restrictions Weight Bearing Restrictions: No LLE Weight Bearing: Weight bearing as tolerated    Mobility  Bed Mobility Overal bed mobility: Needs Assistance Bed Mobility: Supine to Sit     Supine to sit: Min guard     General bed mobility comments: bed flat with bed height elevated. Increased time and pt requires time to "prep" for mobility but able to transition to sitting with use of rail and no physical assist  Transfers Overall transfer level: Needs assistance   Transfers: Sit to/from Stand Sit to Stand: Min guard;From  elevated surface         General transfer comment: sit to stand from elevated bed x 2 trials  Ambulation/Gait Ambulation/Gait assistance: Min guard Gait Distance (Feet): 40 Feet Assistive device: Rolling walker (2 wheeled) Gait Pattern/deviations: Decreased stride length;Step-through pattern;Wide base of support   Gait velocity interpretation: 1.31 - 2.62 ft/sec, indicative of limited community ambulator General Gait Details: with use of RW pt able to walk 40' then additional 30' with seated rest and recovery between trials   Stairs             Wheelchair Mobility    Modified Rankin (Stroke Patients Only)       Balance Overall balance assessment: Needs assistance Sitting-balance support: No upper extremity supported;Feet supported Sitting balance-Leahy Scale: Good Sitting balance - Comments: supervision for static sitting balance EOB   Standing balance support: Bilateral upper extremity supported;During functional activity Standing balance-Leahy Scale: Fair Standing balance comment: use of RW for gait                            Cognition Arousal/Alertness: Awake/alert Behavior During Therapy: WFL for tasks assessed/performed Overall Cognitive Status: Within Functional Limits for tasks assessed                                        Exercises Total Joint Exercises Hip ABduction/ADduction: AROM;Left;Standing;5 reps Standing Hip Extension: AROM;Left;Standing;5 reps General Exercises - Lower Extremity Long  Arc Quad: AROM;Left;Seated;10 reps    General Comments        Pertinent Vitals/Pain Pain Assessment: No/denies pain    Home Living                      Prior Function            PT Goals (current goals can now be found in the care plan section) Acute Rehab PT Goals Time For Goal Achievement: 08/05/19 Potential to Achieve Goals: Good Progress towards PT goals: Progressing toward goals    Frequency    Min  3X/week      PT Plan Current plan remains appropriate    Co-evaluation              AM-PAC PT "6 Clicks" Mobility   Outcome Measure  Help needed turning from your back to your side while in a flat bed without using bedrails?: A Little Help needed moving from lying on your back to sitting on the side of a flat bed without using bedrails?: A Little Help needed moving to and from a bed to a chair (including a wheelchair)?: A Little Help needed standing up from a chair using your arms (e.g., wheelchair or bedside chair)?: A Little Help needed to walk in hospital room?: A Little Help needed climbing 3-5 steps with a railing? : A Lot 6 Click Score: 17    End of Session   Activity Tolerance: Patient tolerated treatment well Patient left: in chair;with call bell/phone within reach;with nursing/sitter in room(geomat) Nurse Communication: Mobility status PT Visit Diagnosis: Other abnormalities of gait and mobility (R26.89);Muscle weakness (generalized) (M62.81);Difficulty in walking, not elsewhere classified (R26.2);Pain     Time: 1122-1210 PT Time Calculation (min) (ACUTE ONLY): 48 min  Charges:  $Gait Training: 8-22 mins $Therapeutic Exercise: 8-22 mins $Therapeutic Activity: 8-22 mins                     Champ Keetch P, PT Acute Rehabilitation Services Pager: [phone] Office: [place] 07/22/2019, 1:17 PM

## 2019-07-22 NOTE — Progress Notes (Signed)
PT Cancellation Note  Patient Details Name: Robert Sullivan MRN: [id number] DOB: [birth year]   Cancelled Treatment:    Reason Eval/Treat Not Completed: Other (comment)(pt on the phone and deferring therapy until after call and cleaned up)   Kaarin Pardy B Junita Kubota 07/22/2019, 8:11 AM Bayard Males, PT Acute Rehabilitation Services Pager: [phone] Office: [phone]

## 2019-07-22 NOTE — Telephone Encounter (Signed)
Patient was scheduled for this Friday for 1st week post/op visit. Patient's wife was assured that we will get patient set up for HHPT services if needed then as well as removing the wound vac.

## 2019-07-22 NOTE — Progress Notes (Signed)
Patient alert awake. VAC emptied this morning  Serous bloody fluid.  Patient states he feels better today. Agrees to work with PT and will be OOB.   Plan for DC tomorrow. Patient seen with Dr. Sharol Given

## 2019-07-22 NOTE — Progress Notes (Signed)
Vac canister changed with 472m of serosanguinous fluid in it since 7am. Vac now has two green check marks for seal.

## 2019-07-23 LAB — GLUCOSE, CAPILLARY
Glucose-Capillary: 129 mg/dL — ABNORMAL HIGH (ref 70–99)
Glucose-Capillary: 122 mg/dL — ABNORMAL HIGH (ref 70–99)
Glucose-Capillary: 109 mg/dL — ABNORMAL HIGH (ref 70–99)
Glucose-Capillary: 102 mg/dL — ABNORMAL HIGH (ref 70–99)

## 2019-07-23 NOTE — Plan of Care (Signed)

## 2019-07-23 NOTE — Progress Notes (Signed)
Physical Therapy Treatment Patient Details Name: Tabitha Tupper MRN: [id number] DOB: 1966-03-05 Today's Date: 07/23/2019    History of Present Illness 54 year old male with recent history of fall and subsequent gluteal abscess.  He has undergone IR drainage and several surgical I&D.  repeat I&Ds 1/2, 1/6, 1/8 with excision of necrotic gluteal. He has been on IV antibiotics for approximately 1 month. Returned after recent D/C with increased edema and drainage PMHx:DM2, OA, septic shock, obesity, and arthritis.    PT Comments    Pt ambulated in room today, but not as far as previous session. HR up to 153 with gait and pt c/o some dizziness, and overall fatigue. He attributes to not receiving blood today.  He has many questions regarding his wound vac at home, as well as medicines. Questions deferred to MD and nursing.  Once he is up, he ambulates fairly well with the RW. He does need increased time with transfers and elevated surface for standing. Con't to recommend HHPT.   Follow Up Recommendations  Home health PT     Equipment Recommendations  3in1 (PT)    Recommendations for Other Services       Precautions / Restrictions Precautions Precautions: Fall Precaution Comments: wound vac Restrictions Weight Bearing Restrictions: No LLE Weight Bearing: Weight bearing as tolerated    Mobility  Bed Mobility Overal bed mobility: Needs Assistance Bed Mobility: Supine to Sit;Sit to Supine     Supine to sit: Min guard Sit to supine: Min guard   General bed mobility comments: bed flat with use of rails with increased time.  With return supine HR up to 143.  Transfers Overall transfer level: Needs assistance Equipment used: Rolling walker (2 wheeled) Transfers: Sit to/from Stand Sit to Stand: Min guard;From elevated surface         General transfer comment: Stood 2x from elevated bed. Side stepping after gait to get farther up.  Ambulation/Gait Ambulation/Gait assistance: Min  guard Gait Distance (Feet): 20 Feet Assistive device: Rolling walker (2 wheeled) Gait Pattern/deviations: Step-through pattern;Wide base of support     General Gait Details: Amb in room and began to feel dizzy which he attributes to the fact today was 1st day in 3 days he didn't get blood.  HR 153. With rest, he felt better, but took a while for HR to go down.    Stairs             Wheelchair Mobility    Modified Rankin (Stroke Patients Only)       Balance   Sitting-balance support: Single extremity supported;Feet supported Sitting balance-Leahy Scale: Fair Sitting balance - Comments: heavy reliance on R UE with sitting balance today   Standing balance support: Bilateral upper extremity supported Standing balance-Leahy Scale: Fair Standing balance comment: use of RW for gait                            Cognition Arousal/Alertness: Awake/alert Behavior During Therapy: WFL for tasks assessed/performed Overall Cognitive Status: Within Functional Limits for tasks assessed                                 General Comments: Pt is very concerned about going home on small wound vac when he feels he will drain more than it allows.      Exercises Other Exercises Other Exercises: Pt stated he would be doing his leg exercises  later.    General Comments General comments (skin integrity, edema, etc.): Pt focused on his concern about the wound vac at home and that he doesn't think it will work.  He had several questions regarding vitamins, meds, deferred to nursing staff.      Pertinent Vitals/Pain Faces Pain Scale: Hurts a little bit Pain Location: L buttocks Pain Descriptors / Indicators: Guarding Pain Intervention(s): Premedicated before session;Limited activity within patient's tolerance    Home Living                      Prior Function            PT Goals (current goals can now be found in the care plan section) Acute Rehab PT  Goals Patient Stated Goal: home PT Goal Formulation: With patient Time For Goal Achievement: 08/05/19 Potential to Achieve Goals: Good Progress towards PT goals: Progressing toward goals    Frequency    Min 3X/week      PT Plan Current plan remains appropriate    Co-evaluation              AM-PAC PT "6 Clicks" Mobility   Outcome Measure  Help needed turning from your back to your side while in a flat bed without using bedrails?: A Little Help needed moving from lying on your back to sitting on the side of a flat bed without using bedrails?: A Little Help needed moving to and from a bed to a chair (including a wheelchair)?: A Little Help needed standing up from a chair using your arms (e.g., wheelchair or bedside chair)?: A Little Help needed to walk in hospital room?: A Little Help needed climbing 3-5 steps with a railing? : A Lot 6 Click Score: 17    End of Session   Activity Tolerance: Treatment limited secondary to medical complications (Comment)(HR 153 with gait) Patient left: in bed;with call bell/phone within reach Nurse Communication: Mobility status PT Visit Diagnosis: Other abnormalities of gait and mobility (R26.89);Muscle weakness (generalized) (M62.81);Difficulty in walking, not elsewhere classified (R26.2);Pain Pain - Right/Left: Left Pain - part of body: Hip     Time: 2035-5974 PT Time Calculation (min) (ACUTE ONLY): 45 min  Charges:  $Gait Training: 8-22 mins $Therapeutic Activity: 23-37 mins                     Cherylene Ferrufino L. Tamala Julian, [place] Pager [phone] 07/23/2019    Galen Manila 07/23/2019, 3:01 PM

## 2019-07-23 NOTE — Progress Notes (Signed)
Wound vac canister changed- 456ms

## 2019-07-23 NOTE — Progress Notes (Signed)
Dr Sharol Given is at the bedside.  Wound vac dressing changed per Dr Sharol Given.  Dr Sharol Given has talked with the patient regarding plan of care.  Dr Sharol Given is aware of the moderate to large amount of drainage from wound vac.

## 2019-07-24 LAB — CBC
HCT: 23.1 % — ABNORMAL LOW (ref 39.0–52.0)
Hemoglobin: 7.1 g/dL — ABNORMAL LOW (ref 13.0–17.0)
MCH: 29.8 pg (ref 26.0–34.0)
MCHC: 30.7 g/dL (ref 30.0–36.0)
MCV: 97.1 fL (ref 80.0–100.0)
Platelets: 99 10*3/uL — ABNORMAL LOW (ref 150–400)
RBC: 2.38 MIL/uL — ABNORMAL LOW (ref 4.22–5.81)
RDW: 18.3 % — ABNORMAL HIGH (ref 11.5–15.5)
WBC: 4.5 10*3/uL (ref 4.0–10.5)
nRBC: 0 % (ref 0.0–0.2)

## 2019-07-24 LAB — GLUCOSE, CAPILLARY
Glucose-Capillary: 113 mg/dL — ABNORMAL HIGH (ref 70–99)
Glucose-Capillary: 116 mg/dL — ABNORMAL HIGH (ref 70–99)
Glucose-Capillary: 121 mg/dL — ABNORMAL HIGH (ref 70–99)
Glucose-Capillary: 121 mg/dL — ABNORMAL HIGH (ref 70–99)

## 2019-07-24 NOTE — Progress Notes (Signed)
450 in Vac yesterday afternoon. 400now. Slowly drainage is decrasing. VSS  But tachy. Will recheck CBC today. Plan Discharge tomorrow

## 2019-07-24 NOTE — Plan of Care (Signed)

## 2019-07-24 NOTE — Progress Notes (Signed)
Physical Therapy Treatment Patient Details Name: Robert Sullivan MRN: [id number] DOB: July 14, 1965 Today's Date: 07/24/2019    History of Present Illness 54 year old male with recent history of fall and subsequent gluteal abscess.  He has undergone IR drainage and several surgical I&D.  repeat I&Ds 1/2, 1/6, 1/8 with excision of necrotic gluteal. He has been on IV antibiotics for approximately 1 month. Returned after recent D/C with increased edema and drainage PMHx:DM2, OA, septic shock, obesity, and arthritis.    PT Comments    Pt resting in bed upon arrival stating he could not get out of bed today because he felt so weak, fatigued and lightheaded. Pt reporting nursing and MD deciding on what to do about his low hemoglobin (7.1). pt agreeable to performing bed level therex. Pt performed LE and UE exercises in supine with correct performance. Pt required frequent encouragement for participation. Pt required mod redirection to task. Pt provided with full water bottles from room to utilize as weights during UE exercises. Pt initially very anxious after phone call with family. Pt stating he has been having much more anxiety since being in the hospital. Pt educated on diaphragmatic breathing when feeling anxious. Pt appeared to be in much better spirits end of session encouraged by the use of water bottles as weights. Pt encouraged to perform further UE and LE exercises during the day, sitting up in bed and increased HOB for improved upright posture. Pt will continue to benefit from acute PT for further mobility progression.     Follow Up Recommendations  Home health PT     Equipment Recommendations  3in1 (PT)    Recommendations for Other Services       Precautions / Restrictions Precautions Precautions: Fall Precaution Comments: wound vac Restrictions Weight Bearing Restrictions: Yes LLE Weight Bearing: Weight bearing as tolerated    Mobility  Bed Mobility               General bed  mobility comments: pt refusing to get EOB  Transfers                 General transfer comment: pt refusing  Ambulation/Gait             General Gait Details: pt refusing   Stairs             Wheelchair Mobility    Modified Rankin (Stroke Patients Only)       Balance                                            Cognition Arousal/Alertness: Awake/alert Behavior During Therapy: WFL for tasks assessed/performed;Anxious Overall Cognitive Status: Within Functional Limits for tasks assessed                                 General Comments: Pt is very concerned about going home on small wound vac when he feels he will drain more than it allows.      Exercises Total Joint Exercises Ankle Circles/Pumps: AROM;Both;15 reps Quad Sets: AROM;Both;10 reps Gluteal Sets: AROM;Both;10 reps Towel Squeeze: AROM;Both;10 reps Short Arc Quad: AROM;10 reps;AAROM;Both Heel Slides: AROM;Both;10 reps General Exercises - Upper Extremity Shoulder Flexion: AROM;Both;15 reps;Other (comment)(water bottles for resistance) Elbow Flexion: AROM;Both;10 reps(water bottles as weights) Elbow Extension: AROM;Both;10 reps(water bottles as weights) Other Exercises Other Exercises: supine  chest press with water bottles as resistance x10    General Comments        Pertinent Vitals/Pain Faces Pain Scale: Hurts a little bit Pain Location: L buttocks/hip esp with movement on LLE Pain Descriptors / Indicators: Grimacing;Discomfort;Aching Pain Intervention(s): Limited activity within patient's tolerance;Monitored during session    Home Living                      Prior Function            PT Goals (current goals can now be found in the care plan section) Progress towards PT goals: Progressing toward goals    Frequency    Min 3X/week      PT Plan Current plan remains appropriate    Co-evaluation              AM-PAC PT "6  Clicks" Mobility   Outcome Measure  Help needed turning from your back to your side while in a flat bed without using bedrails?: A Little Help needed moving from lying on your back to sitting on the side of a flat bed without using bedrails?: A Little Help needed moving to and from a bed to a chair (including a wheelchair)?: A Little Help needed standing up from a chair using your arms (e.g., wheelchair or bedside chair)?: A Little Help needed to walk in hospital room?: A Little Help needed climbing 3-5 steps with a railing? : A Lot 6 Click Score: 17    End of Session Equipment Utilized During Treatment: Gait belt Activity Tolerance: Patient limited by fatigue Patient left: in bed;with call bell/phone within reach Nurse Communication: Mobility status PT Visit Diagnosis: Other abnormalities of gait and mobility (R26.89);Muscle weakness (generalized) (M62.81);Difficulty in walking, not elsewhere classified (R26.2);Pain Pain - Right/Left: Left Pain - part of body: Hip     Time: 7829-5621 PT Time Calculation (min) (ACUTE ONLY): 19 min  Charges:  $Therapeutic Exercise: 8-22 mins                     Zachary George PT, DPT 11:16 AM,07/24/19    Isa Hitz Drucilla Chalet 07/24/2019, 11:10 AM

## 2019-07-24 NOTE — Progress Notes (Signed)
OT Cancellation Note  Patient Details Name: Robert Sullivan MRN: [id number] DOB: 1965-09-14   Cancelled Treatment:    Reason Eval/Treat Not Completed: Patient declined, no reason specified. Pt states that he is not doing anymore therapy today and not getting up. Will try again next available day/time  [name] 07/24/2019, 11:04 AM

## 2019-07-25 ENCOUNTER — Inpatient Hospital Stay: Payer: Self-pay | Admitting: Family

## 2019-07-25 LAB — GLUCOSE, CAPILLARY
Glucose-Capillary: 97 mg/dL (ref 70–99)
Glucose-Capillary: 116 mg/dL — ABNORMAL HIGH (ref 70–99)

## 2019-07-25 MED ORDER — OXYCODONE HCL 5 MG PO TABS: 5.0000 mg | ORAL_TABLET | ORAL | 0 refills | Status: DC | PRN

## 2019-07-25 MED ORDER — ASPIRIN 325 MG PO TABS: 325.0000 mg | ORAL_TABLET | Freq: Every day | ORAL | Status: DC

## 2019-07-25 MED ORDER — HEPARIN SOD (PORK) LOCK FLUSH 100 UNIT/ML IV SOLN
250.0000 [IU] | INTRAVENOUS | Status: AC | PRN
  Administered 2019-07-25: 250 [IU]
  Filled 2019-07-25: qty 2.5

## 2019-07-25 NOTE — Progress Notes (Signed)
Patient discharged with PICC in right arm for prolong IV use.  Patient educated on using and caring for PICC.  Home infusion set up for patient.  07/25/19 1459  PICC Single Lumen 06/19/19 PICC Right Cephalic 47 cm 2 cm  Placement Date/Time: 06/19/19 1425   Time Out: Correct patient;Correct site;Correct procedure  Maximum sterile barrier precautions: Hand hygiene;Large sterile sheet;Cap;Mask;Sterile gown;Sterile gloves  Site Prep: Chlorhexidine (preferred);Skin Prep C...  Indication for Insertion or Continuance of Line Prolonged intravenous therapies  Site Assessment Clean;Dry;Intact  Line Status Saline locked  Dressing Type Transparent;Securing device  Dressing Status Clean;Dry;Intact;Antimicrobial disc in place  Line Care Tubing changed;Connections checked and tightened;Other (Comment) (Dressing changed 07/25/2019)  Dressing Change Due 08/01/19

## 2019-07-25 NOTE — Plan of Care (Signed)
  Problem: Education: Goal: Knowledge of General Education information will improve Description: Including pain rating scale, medication(s)/side effects and non-pharmacologic comfort measures Outcome: Adequate for Discharge   Problem: Health Behavior/Discharge Planning: Goal: Ability to manage health-related needs will improve Outcome: Adequate for Discharge   Problem: Clinical Measurements: Goal: Ability to maintain clinical measurements within normal limits will improve Outcome: Adequate for Discharge Goal: Will remain free from infection Outcome: Adequate for Discharge Goal: Diagnostic test results will improve Outcome: Adequate for Discharge Goal: Respiratory complications will improve Outcome: Adequate for Discharge Goal: Cardiovascular complication will be avoided Outcome: Adequate for Discharge   Problem: Activity: Goal: Risk for activity intolerance will decrease Outcome: Adequate for Discharge   Problem: Nutrition: Goal: Adequate nutrition will be maintained Outcome: Adequate for Discharge   Problem: Coping: Goal: Level of anxiety will decrease Outcome: Adequate for Discharge   Problem: Elimination: Goal: Will not experience complications related to bowel motility Outcome: Adequate for Discharge Goal: Will not experience complications related to urinary retention Outcome: Adequate for Discharge   Problem: Pain Managment: Goal: General experience of comfort will improve Outcome: Adequate for Discharge   Problem: Safety: Goal: Ability to remain free from injury will improve Outcome: Adequate for Discharge   Problem: Skin Integrity: Goal: Risk for impaired skin integrity will decrease Outcome: Adequate for Discharge   Problem: Education: Goal: Required Educational Video(s) Outcome: Adequate for Discharge   Problem: Clinical Measurements: Goal: Ability to maintain clinical measurements within normal limits will improve Outcome: Adequate for  Discharge Goal: Postoperative complications will be avoided or minimized Outcome: Adequate for Discharge   Problem: Skin Integrity: Goal: Demonstration of wound healing without infection will improve Outcome: Adequate for Discharge   Problem: Acute Rehab PT Goals(only PT should resolve) Goal: Patient Will Transfer Sit To/From Stand Outcome: Adequate for Discharge Goal: Pt Will Transfer Bed To Chair/Chair To Bed Outcome: Adequate for Discharge Goal: Pt Will Ambulate Outcome: Adequate for Discharge Goal: Pt Will Go Up/Down Stairs Outcome: Adequate for Discharge   Problem: Acute Rehab OT Goals (only OT should resolve) Goal: Pt. Will Perform Grooming Outcome: Adequate for Discharge Goal: Pt. Will Perform Lower Body Bathing Outcome: Adequate for Discharge Goal: Pt. Will Perform Lower Body Dressing Outcome: Adequate for Discharge Goal: Pt. Will Transfer To Toilet Outcome: Adequate for Discharge Goal: Pt. Will Perform Toileting-Clothing Manipulation Outcome: Adequate for Discharge

## 2019-07-25 NOTE — Discharge Summary (Signed)
Discharge Diagnoses:  Active Problems:   Abscess of left hip   Blister of hip with infection   Surgeries: Procedure(s): REPEAT IRRIGATION AND DEBRIDEMENT LEFT HIP on 07/18/2019    Consultants:   Discharged Condition: Improved  Hospital Course: Robert Sullivan is an 54 y.o. male who was admitted 07/09/2019 with a chief complaint of Left Hip Pain, with a final diagnosis of Abscess Left Hip.  Patient was brought to the operating room on 07/18/2019 and underwent Procedure(s): REPEAT IRRIGATION AND DEBRIDEMENT LEFT HIP.    Patient was given perioperative antibiotics:  Anti-infectives (From admission, onward)   Start     Dose/Rate Route Frequency Ordered Stop   07/21/19 0000  ceFEPime (MAXIPIME) IVPB     2 g Intravenous Every 8 hours 07/21/19 0745 08/13/19 2359   07/21/19 0000  ceFEPIme 2 g in sodium chloride 0.9 % 100 mL     2 g Intravenous Every 8 hours 07/21/19 0802 08/07/19 2359   07/16/19 1145  ceFAZolin (ANCEF) 3 g in dextrose 5 % 50 mL IVPB     3 g 100 mL/hr over 30 Minutes Intravenous On call to O.R. 07/16/19 1143 07/16/19 1315   07/15/19 0600  ceFAZolin (ANCEF) 3 g in dextrose 5 % 50 mL IVPB  Status:  Discontinued     3 g 100 mL/hr over 30 Minutes Intravenous On call to O.R. 07/14/19 2052 07/16/19 0559   07/12/19 0600  ceFAZolin (ANCEF) 3 g in dextrose 5 % 50 mL IVPB  Status:  Discontinued     3 g 100 mL/hr over 30 Minutes Intravenous On call to O.R. 07/11/19 2055 07/12/19 1104   07/11/19 1400  ceFEPIme (MAXIPIME) 2 g in sodium chloride 0.9 % 100 mL IVPB     2 g 200 mL/hr over 30 Minutes Intravenous Every 8 hours 07/11/19 1309     07/10/19 1400  ceFAZolin (ANCEF) IVPB 2g/100 mL premix  Status:  Discontinued     2 g 200 mL/hr over 30 Minutes Intravenous Every 8 hours 07/10/19 0954 07/11/19 1241   07/10/19 0600  ceFAZolin (ANCEF) 3 g in dextrose 5 % 50 mL IVPB     3 g 100 mL/hr over 30 Minutes Intravenous On call to O.R. 07/09/19 1343 07/10/19 0630   07/09/19 2200  ceFAZolin  (ANCEF) IVPB 1 g/50 mL premix  Status:  Discontinued     1 g 100 mL/hr over 30 Minutes Intravenous Every 8 hours 07/09/19 2104 07/10/19 0954    .  Patient was given sequential compression devices, early ambulation, and aspirin for DVT prophylaxis.  Recent vital signs:  Patient Vitals for the past 24 hrs:  BP Temp Temp src Pulse Resp SpO2  07/25/19 0818 131/75 98.4 F (36.9 C) Oral 88 18 99 %  07/25/19 0226 139/68 98 F (36.7 C) Oral 94 17 100 %  07/24/19 2000 (!) 145/73 98.3 F (36.8 C) Oral 82 17 100 %  07/24/19 1334 132/71 97.9 F (36.6 C) Oral 76 18 100 %  .  Recent laboratory studies: No results found.  Discharge Medications:   Allergies as of 07/25/2019      Reactions   Sulfa Antibiotics Rash   Total body rash      Medication List    STOP taking these medications   acetaminophen 325 MG tablet Commonly known as: TYLENOL   insulin aspart 100 UNIT/ML injection Commonly known as: novoLOG   oxyCODONE-acetaminophen 5-325 MG tablet Commonly known as: PERCOCET/ROXICET     TAKE these medications  aspirin 325 MG tablet Take 1 tablet (325 mg total) by mouth daily.   blood glucose meter kit and supplies Kit Dispense based on patient and insurance preference. Use up to four times daily as directed. (FOR ICD-9 250.00, 250.01).   ceFEPime  IVPB Commonly known as: MAXIPIME Inject 2 g into the vein every 8 (eight) hours for 23 days. Indication:  Left hip wound infection Last Day of Therapy:  08/07/19 Labs - Once weekly:  CBC/D and BMP, Labs - Every other week:  ESR and CRP   ceFEPIme 2 g in sodium chloride 0.9 % 100 mL Inject 2 g into the vein every 8 (eight) hours for 17 days.   cholecalciferol 25 MCG (1000 UNIT) tablet Commonly known as: VITAMIN D3 Take 2,000 Units by mouth daily.   docusate sodium 100 MG capsule Commonly known as: COLACE Take 1 capsule (100 mg total) by mouth 2 (two) times daily.   Ensure Max Protein Liqd Take 330 mLs (11 oz total) by mouth  daily.   insulin glargine 100 UNIT/ML injection Commonly known as: LANTUS Inject 0.24 mLs (24 Units total) into the skin daily.   Insulin Syringes (Disposable) U-100 0.5 ML Misc Use as directed 3 times daily AC.   multivitamin with minerals Tabs tablet Take 1 tablet by mouth daily.   oxyCODONE 5 MG immediate release tablet Commonly known as: Oxy IR/ROXICODONE Take 1-2 tablets (5-10 mg total) by mouth every 4 (four) hours as needed for moderate pain (pain score 4-6).   polyethylene glycol 17 g packet Commonly known as: MIRALAX / GLYCOLAX Take 17 g by mouth daily as needed for mild constipation or moderate constipation.   vitamin C 500 MG tablet Commonly known as: ASCORBIC ACID Take 1,000 mg by mouth daily.            Home Infusion Instuctions  (From admission, onward)         Start     Ordered   07/21/19 0000  Home infusion instructions    Question:  Instructions  Answer:  Flushing of vascular access device: 0.9% NaCl pre/post medication administration and prn patency; Heparin 100 u/ml, 76m for implanted ports and Heparin 10u/ml, 57mfor all other central venous catheters.   07/21/19 0745          Diagnostic Studies: MR HIP LEFT W WO CONTRAST  Result Date: 07/10/2019 CLINICAL DATA:  Follow-up left gluteal abscess. EXAM: MRI OF THE LEFT HIP WITHOUT AND WITH CONTRAST TECHNIQUE: Multiplanar, multisequence MR imaging was performed both before and after administration of intravenous contrast. CONTRAST:  1056mADAVIST GADOBUTROL 1 MMOL/ML IV SOLN COMPARISON:  MRI 06/13/2019 and CT scan 06/20/2019 FINDINGS: Examination is quite limited due to body habitus and motion. Both hips are normally located. No findings suspicious for septic arthritis or osteomyelitis involving the hips. The pubic symphysis and SI joints are intact. No evidence of septic arthritis. The patient had a huge left gluteal abscess on the prior MRI from 06/13/2019. This is vastly improved. There are a few small  residual abscesses measuring less than 3 cm. Small gluteal hematomas are also noted. Persistent surrounding myofasciitis. There is also a large open wound in the lateral buttock region. Diffuse myositis involving the right gluteal muscles and right lateral thigh muscles. There is also diffuse myositis involving the left adductor muscles and the left thigh muscles. No definite findings for new areas of pyomyositis. Moderate free pelvic fluid is noted. IMPRESSION: 1. Very limited examination due to body habitus and motion.  2. Vastly improved left gluteal abscess. There are few small sub 3 cm abscesses and a few small hematomas. 3. Persistent diffuse myofasciitis involving the hip and pelvic musculature bilaterally but no new areas of pyomyositis. 4. No definite MR findings for septic arthritis or osteomyelitis. 5. Moderate free pelvic fluid noted. Electronically Signed   By: Marijo Sanes M.D.   On: 07/10/2019 15:58    Patient benefited maximally from their hospital stay and there were no complications.     Disposition: Discharge disposition: 01-Home or Self Care      Discharge Instructions    Call MD / Call 911   Complete by: As directed    If you experience chest pain or shortness of breath, CALL 911 and be transported to the hospital emergency room.  If you develope a fever above 101 F, pus (white drainage) or increased drainage or redness at the wound, or calf pain, call your surgeon's office.   Constipation Prevention   Complete by: As directed    Drink plenty of fluids.  Prune juice may be helpful.  You may use a stool softener, such as Colace (over the counter) 100 mg twice a day.  Use MiraLax (over the counter) for constipation as needed.   Diet - low sodium heart healthy   Complete by: As directed    Discharge instructions   Complete by: As directed    Keep Dressing in Place.   Home infusion instructions   Complete by: As directed    Instructions: Flushing of vascular access device:  0.9% NaCl pre/post medication administration and prn patency; Heparin 100 u/ml, 56m for implanted ports and Heparin 10u/ml, 546mfor all other central venous catheters.   Increase activity slowly as tolerated   Complete by: As directed    Neg Press Wound Therapy / Incisional   Complete by: As directed    Show Patient how to attach prevena pump   Neg Press Wound Therapy / Incisional   Complete by: As directed    Show patient how to attach prevena pump   Neg Press Wound Therapy / Incisional   Complete by: As directed    Show patient and wife how to attach prevena pump and change canisters.. Please provide extra canisters to patient     Follow-up Information    Follow up In 1 week.        DuNewt MinionMD In 1 week.   Specialty: Orthopedic Surgery Contact information: [id number]          Signed: MaBevely Palmerersons 07/25/2019, 8:20 AM

## 2019-07-25 NOTE — Progress Notes (Signed)
Patient discharged home with wife.  Escorted in wheelchair by volunteer.  Patient was transitioned over to [facility] wound vac.  Patient was given 7 cannisters and instructed to follow up with Dr. Jess Barters office for additional cannisters.  AVS was discussed with patient and all questions were answered.  Patient and wife were trained on [place].

## 2019-07-25 NOTE — Progress Notes (Signed)
VSS doing better with PT. Pain controlled. 200 in VAC since  730 last night  Plan for DC today. Will give patient extra canisters. Talked about iron supplementation.

## 2019-07-28 ENCOUNTER — Telehealth: Payer: Self-pay

## 2019-07-28 NOTE — Telephone Encounter (Signed)
Patient called stating that he was sent home with a prevena vac and now vac is full and would like to know what he needs to do?  Patient had left hip surgery on 07/18/2019.  Stated that he had received 2 extra from the hospital, but now they are full.  CB# [phone].  Please advise.  Thank you.

## 2019-07-28 NOTE — Telephone Encounter (Signed)
Called pt and made appt for tomorrow at 3:30 advised to allow the vac to stay intact and turned on. If it starts to alarm then he can turn off and clamp and we will remove in office tomorrow.

## 2019-07-29 ENCOUNTER — Encounter: Payer: Self-pay | Admitting: Physician Assistant

## 2019-07-29 ENCOUNTER — Other Ambulatory Visit: Payer: Self-pay

## 2019-07-29 ENCOUNTER — Ambulatory Visit: Payer: Self-pay | Admitting: Orthopedic Surgery

## 2019-07-29 ENCOUNTER — Telehealth: Payer: Self-pay

## 2019-07-29 ENCOUNTER — Ambulatory Visit (INDEPENDENT_AMBULATORY_CARE_PROVIDER_SITE_OTHER): Payer: Self-pay | Admitting: Physician Assistant

## 2019-07-29 VITALS — Ht 75.0 in | Wt 370.0 lb

## 2019-07-29 DIAGNOSIS — L03317 Cellulitis of buttock: Secondary | ICD-10-CM

## 2019-07-29 NOTE — Telephone Encounter (Signed)
Pt's wife called and states that she is unable to get the pt here safely. That he has gained 50 lbs since the last time Dr. Sharol Given has seen him and that he is unable to take care of himself or move on his own. Advised after speaking with Dr. Sharol Given to call EMS and have pt transported to the hospital if they do not feel safe coming into the office that if they do not feel safe moving pt on their own that this is what they should do. Pt's wife advised that she would call if things change.

## 2019-07-29 NOTE — Progress Notes (Signed)
Office Visit Note   Patient: Robert Sullivan           Date of Birth: 1966-03-05           MRN: [id number] Visit Date: 07/29/2019              Requested by: No referring provider defined for this encounter. PCP: System, Pcp Not In  Chief Complaint  Patient presents with  . Left Hip - Routine Post Op    07/18/19 I&D left hip       HPI: The patient is a 54 year old gentleman who is status post irrigation and debridement left hip.  He was discharged from the hospital this past Friday with his portable VAC and extra canisters.  He called this morning because he no longer has any canisters left and is still having drainage from the wound.  His wife is concerned as he still continues to have overall weakness and he is swelling She reports he has had a weight gain of 50lbs  She has done some research online and believes it is in adverse reaction to the antibiotics. His glucometer is broken so he has not recently checked his blood sugars.  Assessment & Plan: Visit Diagnoses: No diagnosis found.  Plan: Patient was given a pair of VIVE compression stockings and these were placed on him today.  We gave very specific instructions to his wife to be sure that stockings do not have any wrinkling.  We will also discontinue the antibiotics and PICC line.  We discussed physical therapy at home to get him moving more and help with some of the swelling but he has declined this and states that he can do exercises on his own.  They will do simple daily dressing changes with 4 x 4's and tape We also told the patient and his wife that if they have any concerns or questions to contact us at any time.  We encouraged the patient to be up and about as much as he possibly can as this will help to resolve the swelling Follow-Up Instructions: No follow-ups on file.   Ortho Exam  Patient is alert, oriented, no adenopathy, well-dressed, normal affect, normal respiratory effort. Patient was seen by Dr. Sharol Given. The wound vac  was removed. The wound edges are healthy and well approximated. There is no surrounding cellulitis. Dr. Sharol Given attempted to express any fluid from the wound and could not.  There was no fluctuance.  He had pitting edema down into his lower extremities and into his feet bilaterally. His blood pressure today was 135/82.  We did check his blood sugar which was 88.  Pulse oximetry was 97% on room air.  His lower extremities were measured at 50 cm and a double extra-large Vive socks were applied  Imaging: No results found. No images are attached to the encounter.  Labs: Lab Results  Component Value Date   HGBA1C 5.5 07/10/2019   HGBA1C 11.0 (H) 05/13/2019   ESRSEDRATE 12 07/11/2019   CRP 4.6 (H) 07/11/2019   CRP 2.6 (H) 07/08/2019   REPTSTATUS 07/21/2019 FINAL 07/16/2019   GRAMSTAIN  07/16/2019    RARE WBC PRESENT, PREDOMINANTLY PMN NO ORGANISMS SEEN    CULT  07/16/2019    No growth aerobically or anaerobically. Performed at [facility], [place] [address] [place], [place] [address]    LABORGA ENTEROCOCCUS FAECALIS 07/16/2019     Lab Results  Component Value Date   ALBUMIN 1.9 (L) 07/10/2019   ALBUMIN 1.7 (  L) 06/29/2019   ALBUMIN 1.3 (L) 06/28/2019    Lab Results  Component Value Date   MG 1.9 06/20/2019   MG 1.7 05/13/2019   No results found for: VD25OH  No results found for: PREALBUMIN CBC EXTENDED Latest Ref Rng & Units 07/24/2019 07/21/2019 07/21/2019  WBC 4.0 - 10.5 K/uL 4.5 - 7.3  RBC 4.22 - 5.81 MIL/uL 2.38(L) - 2.30(L)  HGB 13.0 - 17.0 g/dL 7.1(L) 7.4(L) 7.0(L)  HCT 39.0 - 52.0 % 23.1(L) 23.5(L) 21.8(L)  PLT 150 - 400 K/uL 99(L) - 109(L)  NEUTROABS 1.7 - 7.7 K/uL - - -  LYMPHSABS 0.7 - 4.0 K/uL - - -     Body mass index is 46.25 kg/m.  Orders:  No orders of the defined types were placed in this encounter.  No orders of the defined types were placed in this encounter.    Procedures: No procedures performed  Clinical Data: No additional  findings.  ROS:  All other systems negative, except as noted in the HPI. Review of Systems  Objective: Vital Signs: Ht 6' 3"  (1.905 m)   Wt (!) 370 lb (167.8 kg)   BMI 46.25 kg/m   Specialty Comments:  No specialty comments available.  PMFS History: Patient Active Problem List   Diagnosis Date Noted  . Blister of hip with infection 07/09/2019  . Abscess of left hip   . MSSA bacteremia 06/19/2019  . Uncontrolled diabetes mellitus ([place]) 06/10/2019  . Obesity, Class III, BMI 40-49.9 (morbid obesity) ([place]) 06/10/2019  . Cellulitis, gluteal, left 06/10/2019  . Sepsis secondary to UTI ([place]) 05/13/2019  . Acute lower UTI 05/13/2019  . Type 2 diabetes mellitus without complication ([place]) 80/09/4915  . Hip pain, acute, left 05/13/2019  . Sepsis ([place]) 05/13/2019  . Acute cystitis without hematuria   . Hyperglycemia    Past Medical History:  Diagnosis Date  . Arthritis 05/13/2019   knees  . Diabetes mellitus without complication ([place])   . Headache   . Sleep apnea   . Sleep apnea     No family history on file.  Past Surgical History:  Procedure Laterality Date  . I & D EXTREMITY Left 07/16/2019   Procedure: LEFT HIP DEBRIDEMENT;  Surgeon: Newt Minion, MD;  Location: [place];  Service: Orthopedics;  Laterality: Left;  . I & D EXTREMITY Left 07/18/2019   Procedure: REPEAT IRRIGATION AND DEBRIDEMENT LEFT HIP;  Surgeon: Newt Minion, MD;  Location: [place];  Service: Orthopedics;  Laterality: Left;  . INCISION AND DRAINAGE ABSCESS Left 06/23/2019   Procedure: INCISION AND DRAINAGE GLUTEAL ABSCESS;  Surgeon: Renette Butters, MD;  Location: [place];  Service: Orthopedics;  Laterality: Left;  . INCISION AND DRAINAGE ABSCESS Left 07/09/2019   Procedure: INCISION AND DRAINAGE LEFT HIP/PELVIC ABSCESS, APPLICATION OF NEGATIVE PRESSURE WOUND VAC;  Surgeon: Newt Minion, MD;  Location: [place];  Service: Orthopedics;  Laterality: Left;  . INCISION AND DRAINAGE HIP Left 06/25/2019    Procedure: IRRIGATION AND DEBRIDEMENT HIP;  Surgeon: Shona Needles, MD;  Location: [place];  Service: Orthopedics;  Laterality: Left;  . INCISION AND DRAINAGE OF WOUND Left 07/12/2019   Procedure: LEFT HIP DEBRIDEMENT;  Surgeon: Newt Minion, MD;  Location: [place];  Service: Orthopedics;  Laterality: Left;  . IR US GUIDE BX ASP/DRAIN  06/17/2019  . TEE WITHOUT CARDIOVERSION N/A 06/16/2019   Procedure: TRANSESOPHAGEAL ECHOCARDIOGRAM (TEE);  Surgeon: Dorothy Spark, MD;  Location: [place];  Service: Cardiovascular;  Laterality:  N/A;   Social History   Occupational History  . Not on file  Tobacco Use  . Smoking status: Never Smoker  . Smokeless tobacco: Never Used  Substance and Sexual Activity  . Alcohol use: Not Currently  . Drug use: Not Currently  . Sexual activity: Not on file

## 2019-07-30 ENCOUNTER — Telehealth: Payer: Self-pay | Admitting: Orthopedic Surgery

## 2019-07-30 NOTE — Telephone Encounter (Signed)
Patient's wife has a wound dressing question. Would like a call back from Dr. Jess Barters assistant. Patient phone number is [phone].

## 2019-07-30 NOTE — Telephone Encounter (Signed)
Patient was called and advised to keep wound covered if draining form the area of concern and they should be fine until appt on Tuesday. Patient understood.

## 2019-07-31 ENCOUNTER — Inpatient Hospital Stay: Payer: Self-pay | Admitting: Orthopedic Surgery

## 2019-08-05 ENCOUNTER — Telehealth: Payer: Self-pay

## 2019-08-05 ENCOUNTER — Other Ambulatory Visit: Payer: Self-pay

## 2019-08-05 ENCOUNTER — Encounter: Payer: Self-pay | Admitting: Orthopedic Surgery

## 2019-08-05 ENCOUNTER — Ambulatory Visit (INDEPENDENT_AMBULATORY_CARE_PROVIDER_SITE_OTHER): Payer: Self-pay | Admitting: Orthopedic Surgery

## 2019-08-05 VITALS — Ht 75.0 in | Wt 370.0 lb

## 2019-08-05 DIAGNOSIS — L03317 Cellulitis of buttock: Secondary | ICD-10-CM

## 2019-08-05 NOTE — Telephone Encounter (Signed)
COVID-19 Pre-Screening Questions:08/05/19  Do you currently have a fever (>100 F), chills or unexplained body aches? NO   Are you currently experiencing new cough, shortness of breath, sore throat, runny nose? NO .  Have you recently travelled outside the state of [place] in the last 14 days? NO .  Have you been in contact with someone that is currently pending confirmation of Covid19 testing or has been confirmed to have the [place] virus?  NO   **If the patient answers NO to ALL questions -  advise the patient to please call the clinic before coming to the office should any symptoms develop.

## 2019-08-06 ENCOUNTER — Encounter: Payer: Self-pay | Admitting: Orthopedic Surgery

## 2019-08-06 ENCOUNTER — Ambulatory Visit: Payer: Self-pay | Admitting: Internal Medicine

## 2019-08-06 ENCOUNTER — Telehealth: Payer: Self-pay

## 2019-08-06 NOTE — Telephone Encounter (Signed)
Patient called requesting to cancel appointment. States he has completed his antibiotics and picc line has already been removed (by Dr. Linnell Fulling wanted to know why he would need this appointment today. LPN stated to patient that this would be a follow up appointment related to his infection to determine if his infections was still present. Patient states he will call Dr. Jess Barters office to check with him to see if he recommends to follow up with [facility]. Patient scheduled for today at 11:15 but states he will call back to let us know if he will keep his appointment or not. Eugenia Mcalpine

## 2019-08-06 NOTE — Progress Notes (Signed)
Office Visit Note   Patient: Robert Sullivan           Date of Birth: [birth year]           MRN: [id number] Visit Date: 08/05/2019              Requested by: No referring provider defined for this encounter. PCP: System, Pcp Not In  Chief Complaint  Patient presents with  . Left Hip - Routine Post Op    07/18/19 I&D left hip       HPI: Patient is a 54 year old gentleman who is 3 weeks status post irrigation debridement of a infection that involved the gluteus muscles down the lateral aspect of the thigh to the distal thigh.  Patient states that when he gets up to ambulate he has been having increasing drainage through the dressing.  Patient's wife is on the phone during the examination.   Assessment & Plan: Visit Diagnoses:  1. Cellulitis, gluteal, left     Plan: Recommended compression shorts to help decrease the swelling.  A dry dressing and Hypafix tape was applied he was given samples of the Hypafix tape recommended dressing changes as needed.  This does appear to be a resolving hematoma.  Follow-Up Instructions: Return in about 1 week (around 08/12/2019).   Ortho Exam  Patient is alert, oriented, no adenopathy, well-dressed, normal affect, normal respiratory effort. On examination there is no cellulitis there is no tenderness to palpation around the incision I cannot express any fluid at this time however there is a significant amount of serosanguineous drainage in the dressing that was removed.  Imaging: No results found. No images are attached to the encounter.  Labs: Lab Results  Component Value Date   HGBA1C 5.5 07/10/2019   HGBA1C 11.0 (H) 05/13/2019   ESRSEDRATE 12 07/11/2019   CRP 4.6 (H) 07/11/2019   CRP 2.6 (H) 07/08/2019   REPTSTATUS 07/21/2019 FINAL 07/16/2019   GRAMSTAIN  07/16/2019    RARE WBC PRESENT, PREDOMINANTLY PMN NO ORGANISMS SEEN    CULT  07/16/2019    No growth aerobically or anaerobically. Performed at [facility], [place] [address] [place], [place] [address]    LABORGA ENTEROCOCCUS FAECALIS 07/16/2019     Lab Results  Component Value Date   ALBUMIN 1.9 (L) 07/10/2019   ALBUMIN 1.7 (L) 06/29/2019   ALBUMIN 1.3 (L) 06/28/2019    Lab Results  Component Value Date   MG 1.9 06/20/2019   MG 1.7 05/13/2019   No results found for: VD25OH  No results found for: PREALBUMIN CBC EXTENDED Latest Ref Rng & Units 07/24/2019 07/21/2019 07/21/2019  WBC 4.0 - 10.5 K/uL 4.5 - 7.3  RBC 4.22 - 5.81 MIL/uL 2.38(L) - 2.30(L)  HGB 13.0 - 17.0 g/dL 7.1(L) 7.4(L) 7.0(L)  HCT 39.0 - 52.0 % 23.1(L) 23.5(L) 21.8(L)  PLT 150 - 400 K/uL 99(L) - 109(L)  NEUTROABS 1.7 - 7.7 K/uL - - -  LYMPHSABS 0.7 - 4.0 K/uL - - -     Body mass index is 46.25 kg/m.  Orders:  No orders of the defined types were placed in this encounter.  No orders of the defined types were placed in this encounter.    Procedures: No procedures performed  Clinical Data: No additional findings.  ROS:  All other systems negative, except as noted in the HPI. Review of Systems  Objective: Vital Signs: Ht 6' 3"  (1.905 m)   Wt (!) 370 lb (167.8 kg)   BMI 46.25  kg/m   Specialty Comments:  No specialty comments available.  PMFS History: Patient Active Problem List   Diagnosis Date Noted  . Blister of hip with infection 07/09/2019  . Abscess of left hip   . MSSA bacteremia 06/19/2019  . Uncontrolled diabetes mellitus ([place]) 06/10/2019  . Obesity, Class III, BMI 40-49.9 (morbid obesity) ([place]) 06/10/2019  . Cellulitis, gluteal, left 06/10/2019  . Sepsis secondary to UTI ([place]) 05/13/2019  . Acute lower UTI 05/13/2019  . Type 2 diabetes mellitus without complication ([place]) 42/35/3614  . Hip pain, acute, left 05/13/2019  . Sepsis ([place]) 05/13/2019  . Acute cystitis without hematuria   . Hyperglycemia    Past Medical History:  Diagnosis Date  . Arthritis 05/13/2019   knees  . Diabetes mellitus without complication ([place])   . Headache   . Sleep apnea    . Sleep apnea     History reviewed. No pertinent family history.  Past Surgical History:  Procedure Laterality Date  . I & D EXTREMITY Left 07/16/2019   Procedure: LEFT HIP DEBRIDEMENT;  Surgeon: Newt Minion, MD;  Location: [place];  Service: Orthopedics;  Laterality: Left;  . I & D EXTREMITY Left 07/18/2019   Procedure: REPEAT IRRIGATION AND DEBRIDEMENT LEFT HIP;  Surgeon: Newt Minion, MD;  Location: [place];  Service: Orthopedics;  Laterality: Left;  . INCISION AND DRAINAGE ABSCESS Left 06/23/2019   Procedure: INCISION AND DRAINAGE GLUTEAL ABSCESS;  Surgeon: Renette Butters, MD;  Location: [place];  Service: Orthopedics;  Laterality: Left;  . INCISION AND DRAINAGE ABSCESS Left 07/09/2019   Procedure: INCISION AND DRAINAGE LEFT HIP/PELVIC ABSCESS, APPLICATION OF NEGATIVE PRESSURE WOUND VAC;  Surgeon: Newt Minion, MD;  Location: [place];  Service: Orthopedics;  Laterality: Left;  . INCISION AND DRAINAGE HIP Left 06/25/2019   Procedure: IRRIGATION AND DEBRIDEMENT HIP;  Surgeon: Shona Needles, MD;  Location: [place];  Service: Orthopedics;  Laterality: Left;  . INCISION AND DRAINAGE OF WOUND Left 07/12/2019   Procedure: LEFT HIP DEBRIDEMENT;  Surgeon: Newt Minion, MD;  Location: [place];  Service: Orthopedics;  Laterality: Left;  . IR US GUIDE BX ASP/DRAIN  06/17/2019  . TEE WITHOUT CARDIOVERSION N/A 06/16/2019   Procedure: TRANSESOPHAGEAL ECHOCARDIOGRAM (TEE);  Surgeon: Dorothy Spark, MD;  Location: [facility] ENDOSCOPY;  Service: Cardiovascular;  Laterality: N/A;   Social History   Occupational History  . Not on file  Tobacco Use  . Smoking status: Never Smoker  . Smokeless tobacco: Never Used  Substance and Sexual Activity  . Alcohol use: Not Currently  . Drug use: Not Currently  . Sexual activity: Not on file

## 2019-08-12 ENCOUNTER — Ambulatory Visit: Payer: Self-pay | Admitting: Orthopedic Surgery

## 2019-08-14 ENCOUNTER — Encounter: Payer: Self-pay | Admitting: Orthopedic Surgery

## 2019-08-14 ENCOUNTER — Other Ambulatory Visit: Payer: Self-pay

## 2019-08-14 ENCOUNTER — Encounter: Payer: Self-pay | Admitting: Medical

## 2019-08-14 ENCOUNTER — Ambulatory Visit (INDEPENDENT_AMBULATORY_CARE_PROVIDER_SITE_OTHER): Payer: Self-pay | Admitting: Orthopedic Surgery

## 2019-08-14 VITALS — Ht 75.0 in | Wt 370.0 lb

## 2019-08-14 DIAGNOSIS — L02416 Cutaneous abscess of left lower limb: Secondary | ICD-10-CM

## 2019-08-14 NOTE — Progress Notes (Signed)
Office Visit Note   Patient: Robert Sullivan           Date of Birth: 1965/10/12           MRN: [id number] Visit Date: 08/14/2019              Requested by: No referring provider defined for this encounter. PCP: System, Pcp Not In  Chief Complaint  Patient presents with  . Left Hip - Routine Post Op    07/18/19 I&D left hip       HPI: This is a pleasant gentleman who is here for follow-up on his left hip.  He is now 4 weeks status post irrigation and debridement left hip with multiple irrigations prior to that.  He continues to complain that he is getting quite a bit of bloody drainage.  Assessment & Plan: Visit Diagnoses: No diagnosis found.  Plan: He will continue to work on dressing changes.  Follow-up in 1 week.  Follow-Up Instructions: No follow-ups on file.   Ortho Exam  Patient is alert, oriented, no adenopathy, well-dressed, normal affect, normal respiratory effort. Focused examination the incision is completely healed with the exception of a 2 cm x 2 cm area.  I cannot express any drainage from this.  There is no foul odor.  No cellulitis Of note he is not wearing his compression stockings because he has difficulty getting them on  Imaging: No results found.   Labs: Lab Results  Component Value Date   HGBA1C 5.5 07/10/2019   HGBA1C 11.0 (H) 05/13/2019   ESRSEDRATE 12 07/11/2019   CRP 4.6 (H) 07/11/2019   CRP 2.6 (H) 07/08/2019   REPTSTATUS 07/21/2019 FINAL 07/16/2019   GRAMSTAIN  07/16/2019    RARE WBC PRESENT, PREDOMINANTLY PMN NO ORGANISMS SEEN    CULT  07/16/2019    No growth aerobically or anaerobically. Performed at [facility], [place] [address] [place], [place] [address]    LABORGA ENTEROCOCCUS FAECALIS 07/16/2019     Lab Results  Component Value Date   ALBUMIN 1.9 (L) 07/10/2019   ALBUMIN 1.7 (L) 06/29/2019   ALBUMIN 1.3 (L) 06/28/2019    Lab Results  Component Value Date   MG 1.9 06/20/2019   MG 1.7 05/13/2019   No results  found for: VD25OH  No results found for: PREALBUMIN CBC EXTENDED Latest Ref Rng & Units 07/24/2019 07/21/2019 07/21/2019  WBC 4.0 - 10.5 K/uL 4.5 - 7.3  RBC 4.22 - 5.81 MIL/uL 2.38(L) - 2.30(L)  HGB 13.0 - 17.0 g/dL 7.1(L) 7.4(L) 7.0(L)  HCT 39.0 - 52.0 % 23.1(L) 23.5(L) 21.8(L)  PLT 150 - 400 K/uL 99(L) - 109(L)  NEUTROABS 1.7 - 7.7 K/uL - - -  LYMPHSABS 0.7 - 4.0 K/uL - - -     Body mass index is 46.25 kg/m.  Orders:  No orders of the defined types were placed in this encounter.  No orders of the defined types were placed in this encounter.    Procedures: No procedures performed  Clinical Data: No additional findings.  ROS:  All other systems negative, except as noted in the HPI. Review of Systems  Objective: Vital Signs: Ht 6' 3"  (1.905 m)   Wt (!) 370 lb (167.8 kg)   BMI 46.25 kg/m   Specialty Comments:  No specialty comments available.  PMFS History: Patient Active Problem List   Diagnosis Date Noted  . Blister of hip with infection 07/09/2019  . Abscess of left hip   . MSSA bacteremia  06/19/2019  . Uncontrolled diabetes mellitus ([place]) 06/10/2019  . Obesity, Class III, BMI 40-49.9 (morbid obesity) ([place]) 06/10/2019  . Cellulitis, gluteal, left 06/10/2019  . Sepsis secondary to UTI ([place]) 05/13/2019  . Acute lower UTI 05/13/2019  . Type 2 diabetes mellitus without complication ([place]) 37/04/6268  . Hip pain, acute, left 05/13/2019  . Sepsis ([place]) 05/13/2019  . Acute cystitis without hematuria   . Hyperglycemia    Past Medical History:  Diagnosis Date  . Arthritis 05/13/2019   knees  . Diabetes mellitus without complication ([place])   . Headache   . Sleep apnea   . Sleep apnea     No family history on file.  Past Surgical History:  Procedure Laterality Date  . I & D EXTREMITY Left 07/16/2019   Procedure: LEFT HIP DEBRIDEMENT;  Surgeon: Newt Minion, MD;  Location: [place];  Service: Orthopedics;  Laterality: Left;  . I & D EXTREMITY Left 07/18/2019    Procedure: REPEAT IRRIGATION AND DEBRIDEMENT LEFT HIP;  Surgeon: Newt Minion, MD;  Location: [place];  Service: Orthopedics;  Laterality: Left;  . INCISION AND DRAINAGE ABSCESS Left 06/23/2019   Procedure: INCISION AND DRAINAGE GLUTEAL ABSCESS;  Surgeon: Renette Butters, MD;  Location: [facility];  Service: Orthopedics;  Laterality: Left;  . INCISION AND DRAINAGE ABSCESS Left 07/09/2019   Procedure: INCISION AND DRAINAGE LEFT HIP/PELVIC ABSCESS, APPLICATION OF NEGATIVE PRESSURE WOUND VAC;  Surgeon: Newt Minion, MD;  Location: [place];  Service: Orthopedics;  Laterality: Left;  . INCISION AND DRAINAGE HIP Left 06/25/2019   Procedure: IRRIGATION AND DEBRIDEMENT HIP;  Surgeon: Shona Needles, MD;  Location: [place];  Service: Orthopedics;  Laterality: Left;  . INCISION AND DRAINAGE OF WOUND Left 07/12/2019   Procedure: LEFT HIP DEBRIDEMENT;  Surgeon: Newt Minion, MD;  Location: [place];  Service: Orthopedics;  Laterality: Left;  . IR US GUIDE BX ASP/DRAIN  06/17/2019  . TEE WITHOUT CARDIOVERSION N/A 06/16/2019   Procedure: TRANSESOPHAGEAL ECHOCARDIOGRAM (TEE);  Surgeon: Dorothy Spark, MD;  [facility] ENDOSCOPY;  Service: Cardiovascular;  Laterality: N/A;   Social History   Occupational History  . Not on file  Tobacco Use  . Smoking status: Never Smoker  . Smokeless tobacco: Never Used  Substance and Sexual Activity  . Alcohol use: Not Currently  . Drug use: Not Currently  . Sexual activity: Not on file

## 2019-08-21 ENCOUNTER — Other Ambulatory Visit: Payer: Self-pay

## 2019-08-21 ENCOUNTER — Ambulatory Visit (INDEPENDENT_AMBULATORY_CARE_PROVIDER_SITE_OTHER): Payer: Self-pay | Admitting: Orthopedic Surgery

## 2019-08-21 DIAGNOSIS — L02416 Cutaneous abscess of left lower limb: Secondary | ICD-10-CM

## 2019-08-22 ENCOUNTER — Encounter: Payer: Self-pay | Admitting: Orthopedic Surgery

## 2019-08-22 MED ORDER — DOXYCYCLINE HYCLATE 100 MG PO TABS: 100.0000 mg | ORAL_TABLET | Freq: Two times a day (BID) | ORAL | 0 refills | Status: DC

## 2019-08-22 NOTE — Addendum Note (Signed)
Addended by: Meridee Score on: 08/22/2019 01:08 PM   Modules accepted: Orders

## 2019-08-22 NOTE — Progress Notes (Addendum)
Office Visit Note   Patient: Robert Sullivan           Date of Birth: 1965-12-06           MRN: [id number] Visit Date: 08/21/2019              Requested by: No referring provider defined for this encounter. PCP: System, Pcp Not In  No chief complaint on file.     HPI: Patient is a 54 year old gentleman who presents status post multiple irrigation debridements for left hip infection.  Patient is currently completed his antibiotics he states he still has some clear serosanguineous drainage.  Patient states he had lab work performed by [facility].  Assessment & Plan: Visit Diagnoses:  1. Abscess of left hip     Plan: We will call in a prescription for doxycycline recommend continuing washing with soap and water dry dressing changes recommended mobilizing better with increased protein supplements.  Recommended a protein supplement drink 3 times a day in addition to his regular meals.  Follow-Up Instructions: Return in about 2 weeks (around 09/04/2019).   Ortho Exam  Patient is alert, oriented, no adenopathy, well-dressed, normal affect, normal respiratory effort. Examination with firm palpation around the incision I cannot express any fluid.  There is no redness no cellulitis no tenderness to palpation.  There is 1 small open area that has been draining bits about the size of a Q-tip.  The sent for patient's labs his glucose was 130 albumin 2.6 his alkaline phosphatase was elevated at 135 and his AST was elevated at 46.  Patient's white blood cell count was within normal range at 9.1 and hemoglobin improved to 11.1.  Imaging: No results found. No images are attached to the encounter.  Labs: Lab Results  Component Value Date   HGBA1C 5.5 07/10/2019   HGBA1C 11.0 (H) 05/13/2019   ESRSEDRATE 12 07/11/2019   CRP 4.6 (H) 07/11/2019   CRP 2.6 (H) 07/08/2019   REPTSTATUS 07/21/2019 FINAL 07/16/2019   GRAMSTAIN  07/16/2019    RARE WBC PRESENT, PREDOMINANTLY PMN NO ORGANISMS  SEEN    CULT  07/16/2019    No growth aerobically or anaerobically. Performed at [facility], [place] [address], [place], [place] [address]    LABORGA ENTEROCOCCUS FAECALIS 07/16/2019     Lab Results  Component Value Date   ALBUMIN 1.9 (L) 07/10/2019   ALBUMIN 1.7 (L) 06/29/2019   ALBUMIN 1.3 (L) 06/28/2019    Lab Results  Component Value Date   MG 1.9 06/20/2019   MG 1.7 05/13/2019   No results found for: VD25OH  No results found for: PREALBUMIN CBC EXTENDED Latest Ref Rng & Units 07/24/2019 07/21/2019 07/21/2019  WBC 4.0 - 10.5 K/uL 4.5 - 7.3  RBC 4.22 - 5.81 MIL/uL 2.38(L) - 2.30(L)  HGB 13.0 - 17.0 g/dL 7.1(L) 7.4(L) 7.0(L)  HCT 39.0 - 52.0 % 23.1(L) 23.5(L) 21.8(L)  PLT 150 - 400 K/uL 99(L) - 109(L)  NEUTROABS 1.7 - 7.7 K/uL - - -  LYMPHSABS 0.7 - 4.0 K/uL - - -     There is no height or weight on file to calculate BMI.  Orders:  No orders of the defined types were placed in this encounter.  Meds ordered this encounter  Medications  . doxycycline (VIBRA-TABS) 100 MG tablet    Sig: Take 1 tablet (100 mg total) by mouth 2 (two) times daily.    Dispense:  60 tablet    Refill:  0  Procedures: No procedures performed  Clinical Data: No additional findings.  ROS:  All other systems negative, except as noted in the HPI. Review of Systems  Objective: Vital Signs: There were no vitals taken for this visit.  Specialty Comments:  No specialty comments available.  PMFS History: Patient Active Problem List   Diagnosis Date Noted  . Blister of hip with infection 07/09/2019  . Abscess of left hip   . MSSA bacteremia 06/19/2019  . Uncontrolled diabetes mellitus ([place]) 06/10/2019  . Obesity, Class III, BMI 40-49.9 (morbid obesity) ([place]) 06/10/2019  . Cellulitis, gluteal, left 06/10/2019  . Sepsis secondary to UTI ([place]) 05/13/2019  . Acute lower UTI 05/13/2019  . Type 2 diabetes mellitus without complication ([place]) 32/91/9166  . Hip pain, acute, left  05/13/2019  . Sepsis ([place]) 05/13/2019  . Acute cystitis without hematuria   . Hyperglycemia    Past Medical History:  Diagnosis Date  . Arthritis 05/13/2019   knees  . Diabetes mellitus without complication ([place])   . Headache   . Sleep apnea   . Sleep apnea     History reviewed. No pertinent family history.  Past Surgical History:  Procedure Laterality Date  . I & D EXTREMITY Left 07/16/2019   Procedure: LEFT HIP DEBRIDEMENT;  Surgeon: Newt Minion, MD;  Location: [facility];  Service: Orthopedics;  Laterality: Left;  . I & D EXTREMITY Left 07/18/2019   Procedure: REPEAT IRRIGATION AND DEBRIDEMENT LEFT HIP;  Surgeon: Newt Minion, MD;  Location: [facility];  Service: Orthopedics;  Laterality: Left;  . INCISION AND DRAINAGE ABSCESS Left 06/23/2019   Procedure: INCISION AND DRAINAGE GLUTEAL ABSCESS;  Surgeon: Renette Butters, MD;  Location: [facility];  Service: Orthopedics;  Laterality: Left;  . INCISION AND DRAINAGE ABSCESS Left 07/09/2019   Procedure: INCISION AND DRAINAGE LEFT HIP/PELVIC ABSCESS, APPLICATION OF NEGATIVE PRESSURE WOUND VAC;  Surgeon: Newt Minion, MD;  Location: [facility];  Service: Orthopedics;  Laterality: Left;  . INCISION AND DRAINAGE HIP Left 06/25/2019   Procedure: IRRIGATION AND DEBRIDEMENT HIP;  Surgeon: Shona Needles, MD;  Location: [facility];  Service: Orthopedics;  Laterality: Left;  . INCISION AND DRAINAGE OF WOUND Left 07/12/2019   Procedure: LEFT HIP DEBRIDEMENT;  Surgeon: Newt Minion, MD;  Location: [facility];  Service: Orthopedics;  Laterality: Left;  . IR US GUIDE BX ASP/DRAIN  06/17/2019  . TEE WITHOUT CARDIOVERSION N/A 06/16/2019   Procedure: TRANSESOPHAGEAL ECHOCARDIOGRAM (TEE);  Surgeon: Dorothy Spark, MD;  Location: [facility] ENDOSCOPY;  Service: Cardiovascular;  Laterality: N/A;   Social History   Occupational History  . Not on file  Tobacco Use  . Smoking status: Never Smoker  . Smokeless tobacco: Never Used  Substance and Sexual Activity  . Alcohol use: Not  Currently  . Drug use: Not Currently  . Sexual activity: Not on file

## 2019-08-26 ENCOUNTER — Encounter: Payer: Self-pay | Admitting: Medical

## 2019-09-04 ENCOUNTER — Other Ambulatory Visit: Payer: Self-pay

## 2019-09-04 ENCOUNTER — Ambulatory Visit (INDEPENDENT_AMBULATORY_CARE_PROVIDER_SITE_OTHER): Payer: Self-pay | Admitting: Orthopedic Surgery

## 2019-09-04 ENCOUNTER — Encounter: Payer: Self-pay | Admitting: Orthopedic Surgery

## 2019-09-04 VITALS — Ht 75.0 in | Wt 370.0 lb

## 2019-09-04 DIAGNOSIS — L02416 Cutaneous abscess of left lower limb: Secondary | ICD-10-CM

## 2019-09-04 NOTE — Progress Notes (Signed)
Office Visit Note   Patient: Robert Sullivan           Date of Birth: Feb 05, 1966           MRN: [id number] Visit Date: 09/04/2019              Requested by: No referring provider defined for this encounter. PCP: System, Pcp Not In  Chief Complaint  Patient presents with  . Left Hip - Routine Post Op    07/18/19 rep I&D left hip       HPI: Patient is a 54 year old gentleman who presents in follow-up status post debridement left hip and thigh.  Patient states the drainage has decreased he states he has a small area of hyper granulation tissue and is using a small dressing at this time.  Patient states he has global retained fluid with increased difficulty breathing despite his sleep apnea he states he has swelling in his legs abdomen.  He feels like he needs medical follow-up.  He states he has had labs obtained that we have reviewed with him but his medical doctor is not followed up and he states he now has a follow-up appointment with gastroenterology.  Assessment & Plan: Visit Diagnoses:  1. Abscess of left hip     Plan: Recommended continue Dial soap cleansing continue to increase his activities as tolerated recommend continue dry dressing change there is no signs of infection.  Agree that he needs to follow-up medically to follow-up on his global fluid retention.  Follow-Up Instructions: Return in about 3 weeks (around 09/25/2019).   Ortho Exam  Patient is alert, oriented, no adenopathy, well-dressed, normal affect, normal respiratory effort. Patient states that his urine is dark despite increased water intake.  The incision is well-healed there is no redness no cellulitis no drainage I cannot express any fluid today.  There is 1 small area of granulation tissue about 3 mm in diameter 0.1 mm deep.  Imaging: No results found. No images are attached to the encounter.  Labs: Lab Results  Component Value Date   HGBA1C 5.5 07/10/2019   HGBA1C 11.0 (H) 05/13/2019   ESRSEDRATE 12  07/11/2019   CRP 4.6 (H) 07/11/2019   CRP 2.6 (H) 07/08/2019   REPTSTATUS 07/21/2019 FINAL 07/16/2019   GRAMSTAIN  07/16/2019    RARE WBC PRESENT, PREDOMINANTLY PMN NO ORGANISMS SEEN    CULT  07/16/2019    No growth aerobically or anaerobically. Performed at [facility], [place] [address] [place], [place] [address]    LABORGA ENTEROCOCCUS FAECALIS 07/16/2019     Lab Results  Component Value Date   ALBUMIN 1.9 (L) 07/10/2019   ALBUMIN 1.7 (L) 06/29/2019   ALBUMIN 1.3 (L) 06/28/2019    Lab Results  Component Value Date   MG 1.9 06/20/2019   MG 1.7 05/13/2019   No results found for: VD25OH  No results found for: PREALBUMIN CBC EXTENDED Latest Ref Rng & Units 07/24/2019 07/21/2019 07/21/2019  WBC 4.0 - 10.5 K/uL 4.5 - 7.3  RBC 4.22 - 5.81 MIL/uL 2.38(L) - 2.30(L)  HGB 13.0 - 17.0 g/dL 7.1(L) 7.4(L) 7.0(L)  HCT 39.0 - 52.0 % 23.1(L) 23.5(L) 21.8(L)  PLT 150 - 400 K/uL 99(L) - 109(L)  NEUTROABS 1.7 - 7.7 K/uL - - -  LYMPHSABS 0.7 - 4.0 K/uL - - -     Body mass index is 46.25 kg/m.  Orders:  No orders of the defined types were placed in this encounter.  No orders of the  defined types were placed in this encounter.    Procedures: No procedures performed  Clinical Data: No additional findings.  ROS:  All other systems negative, except as noted in the HPI. Review of Systems  Objective: Vital Signs: Ht 6' 3"  (1.905 m)   Wt (!) 370 lb (167.8 kg)   BMI 46.25 kg/m   Specialty Comments:  No specialty comments available.  PMFS History: Patient Active Problem List   Diagnosis Date Noted  . Blister of hip with infection 07/09/2019  . Abscess of left hip   . MSSA bacteremia 06/19/2019  . Uncontrolled diabetes mellitus ([place]) 06/10/2019  . Obesity, Class III, BMI 40-49.9 (morbid obesity) ([place]) 06/10/2019  . Cellulitis, gluteal, left 06/10/2019  . Sepsis secondary to UTI ([place]) 05/13/2019  . Acute lower UTI 05/13/2019  . Type 2 diabetes mellitus without  complication ([place]) 22/48/2500  . Hip pain, acute, left 05/13/2019  . Sepsis ([place]) 05/13/2019  . Acute cystitis without hematuria   . Hyperglycemia    Past Medical History:  Diagnosis Date  . Arthritis 05/13/2019   knees  . Diabetes mellitus without complication ([place])   . Headache   . Sleep apnea   . Sleep apnea     History reviewed. No pertinent family history.  Past Surgical History:  Procedure Laterality Date  . I & D EXTREMITY Left 07/16/2019   Procedure: LEFT HIP DEBRIDEMENT;  Surgeon: Newt Minion, MD;  Location: [facility];  Service: Orthopedics;  Laterality: Left;  . I & D EXTREMITY Left 07/18/2019   Procedure: REPEAT IRRIGATION AND DEBRIDEMENT LEFT HIP;  Surgeon: Newt Minion, MD;  Location: [facility];  Service: Orthopedics;  Laterality: Left;  . INCISION AND DRAINAGE ABSCESS Left 06/23/2019   Procedure: INCISION AND DRAINAGE GLUTEAL ABSCESS;  Surgeon: Renette Butters, MD;  Location: [facility];  Service: Orthopedics;  Laterality: Left;  . INCISION AND DRAINAGE ABSCESS Left 07/09/2019   Procedure: INCISION AND DRAINAGE LEFT HIP/PELVIC ABSCESS, APPLICATION OF NEGATIVE PRESSURE WOUND VAC;  Surgeon: Newt Minion, MD;  Location: [facility];  Service: Orthopedics;  Laterality: Left;  . INCISION AND DRAINAGE HIP Left 06/25/2019   Procedure: IRRIGATION AND DEBRIDEMENT HIP;  Surgeon: Shona Needles, MD;  Location: [facility];  Service: Orthopedics;  Laterality: Left;  . INCISION AND DRAINAGE OF WOUND Left 07/12/2019   Procedure: LEFT HIP DEBRIDEMENT;  Surgeon: Newt Minion, MD;  Location: [facility];  Service: Orthopedics;  Laterality: Left;  . IR US GUIDE BX ASP/DRAIN  06/17/2019  . TEE WITHOUT CARDIOVERSION N/A 06/16/2019   Procedure: TRANSESOPHAGEAL ECHOCARDIOGRAM (TEE);  Surgeon: Dorothy Spark, MD;  Location: [facility] ENDOSCOPY;  Service: Cardiovascular;  Laterality: N/A;   Social History   Occupational History  . Not on file  Tobacco Use  . Smoking status: Never Smoker  . Smokeless tobacco: Never  Used  Substance and Sexual Activity  . Alcohol use: Not Currently  . Drug use: Not Currently  . Sexual activity: Not on file

## 2019-09-09 ENCOUNTER — Ambulatory Visit: Payer: Self-pay | Admitting: Gastroenterology

## 2019-09-11 ENCOUNTER — Encounter: Payer: Self-pay | Admitting: Physician Assistant

## 2019-09-11 ENCOUNTER — Ambulatory Visit (INDEPENDENT_AMBULATORY_CARE_PROVIDER_SITE_OTHER): Payer: Self-pay | Admitting: Physician Assistant

## 2019-09-11 ENCOUNTER — Other Ambulatory Visit: Payer: Self-pay

## 2019-09-11 ENCOUNTER — Emergency Department (HOSPITAL_COMMUNITY): Payer: Self-pay

## 2019-09-11 ENCOUNTER — Inpatient Hospital Stay (HOSPITAL_COMMUNITY)
Admission: EM | Admit: 2019-09-11 | Discharge: 2019-09-25 | DRG: 424 | Disposition: A | Discharge status: Home Health | Payer: Self-pay | Attending: Internal Medicine | Admitting: Internal Medicine

## 2019-09-11 VITALS — BP 146/76 | HR 104 | Temp 97.7°F

## 2019-09-11 DIAGNOSIS — R0602 Shortness of breath: Secondary | ICD-10-CM

## 2019-09-11 DIAGNOSIS — D638 Anemia in other chronic diseases classified elsewhere: Secondary | ICD-10-CM | POA: Diagnosis present

## 2019-09-11 DIAGNOSIS — K7290 Hepatic failure, unspecified without coma: Secondary | ICD-10-CM | POA: Diagnosis present

## 2019-09-11 DIAGNOSIS — J90 Pleural effusion, not elsewhere classified: Secondary | ICD-10-CM | POA: Diagnosis present

## 2019-09-11 DIAGNOSIS — Z79899 Other long term (current) drug therapy: Secondary | ICD-10-CM

## 2019-09-11 DIAGNOSIS — R609 Edema, unspecified: Secondary | ICD-10-CM

## 2019-09-11 DIAGNOSIS — E1165 Type 2 diabetes mellitus with hyperglycemia: Secondary | ICD-10-CM | POA: Diagnosis present

## 2019-09-11 DIAGNOSIS — R188 Other ascites: Secondary | ICD-10-CM | POA: Diagnosis present

## 2019-09-11 DIAGNOSIS — Z9049 Acquired absence of other specified parts of digestive tract: Secondary | ICD-10-CM

## 2019-09-11 DIAGNOSIS — G4733 Obstructive sleep apnea (adult) (pediatric): Secondary | ICD-10-CM | POA: Diagnosis present

## 2019-09-11 DIAGNOSIS — K7460 Unspecified cirrhosis of liver: Principal | ICD-10-CM | POA: Diagnosis present

## 2019-09-11 DIAGNOSIS — I959 Hypotension, unspecified: Secondary | ICD-10-CM | POA: Diagnosis present

## 2019-09-11 DIAGNOSIS — K7581 Nonalcoholic steatohepatitis (NASH): Secondary | ICD-10-CM | POA: Diagnosis present

## 2019-09-11 DIAGNOSIS — E785 Hyperlipidemia, unspecified: Secondary | ICD-10-CM | POA: Diagnosis present

## 2019-09-11 DIAGNOSIS — I10 Essential (primary) hypertension: Secondary | ICD-10-CM | POA: Diagnosis present

## 2019-09-11 DIAGNOSIS — R601 Generalized edema: Secondary | ICD-10-CM | POA: Diagnosis present

## 2019-09-11 DIAGNOSIS — Z8249 Family history of ischemic heart disease and other diseases of the circulatory system: Secondary | ICD-10-CM

## 2019-09-11 DIAGNOSIS — Z7982 Long term (current) use of aspirin: Secondary | ICD-10-CM

## 2019-09-11 DIAGNOSIS — Z20822 Contact with and (suspected) exposure to covid-19: Secondary | ICD-10-CM | POA: Diagnosis present

## 2019-09-11 DIAGNOSIS — Z8619 Personal history of other infectious and parasitic diseases: Secondary | ICD-10-CM

## 2019-09-11 DIAGNOSIS — M869 Osteomyelitis, unspecified: Secondary | ICD-10-CM | POA: Diagnosis present

## 2019-09-11 DIAGNOSIS — E875 Hyperkalemia: Secondary | ICD-10-CM | POA: Diagnosis present

## 2019-09-11 DIAGNOSIS — E876 Hypokalemia: Secondary | ICD-10-CM | POA: Diagnosis present

## 2019-09-11 DIAGNOSIS — E1169 Type 2 diabetes mellitus with other specified complication: Secondary | ICD-10-CM | POA: Diagnosis present

## 2019-09-11 DIAGNOSIS — K766 Portal hypertension: Secondary | ICD-10-CM | POA: Diagnosis present

## 2019-09-11 DIAGNOSIS — L0231 Cutaneous abscess of buttock: Secondary | ICD-10-CM | POA: Diagnosis present

## 2019-09-11 DIAGNOSIS — Z86718 Personal history of other venous thrombosis and embolism: Secondary | ICD-10-CM

## 2019-09-11 DIAGNOSIS — B159 Hepatitis A without hepatic coma: Secondary | ICD-10-CM | POA: Diagnosis present

## 2019-09-11 DIAGNOSIS — Z6841 Body Mass Index (BMI) 40.0 and over, adult: Secondary | ICD-10-CM

## 2019-09-11 DIAGNOSIS — D696 Thrombocytopenia, unspecified: Secondary | ICD-10-CM | POA: Diagnosis present

## 2019-09-11 DIAGNOSIS — R161 Splenomegaly, not elsewhere classified: Secondary | ICD-10-CM | POA: Diagnosis present

## 2019-09-11 DIAGNOSIS — Z87891 Personal history of nicotine dependence: Secondary | ICD-10-CM

## 2019-09-11 DIAGNOSIS — L02416 Cutaneous abscess of left lower limb: Secondary | ICD-10-CM | POA: Diagnosis present

## 2019-09-11 DIAGNOSIS — G4730 Sleep apnea, unspecified: Secondary | ICD-10-CM | POA: Diagnosis present

## 2019-09-11 DIAGNOSIS — M609 Myositis, unspecified: Secondary | ICD-10-CM | POA: Diagnosis present

## 2019-09-11 DIAGNOSIS — Z794 Long term (current) use of insulin: Secondary | ICD-10-CM

## 2019-09-11 LAB — TROPONIN I (HIGH SENSITIVITY)
Troponin I (High Sensitivity): 5 ng/L (ref <18)
Troponin I (High Sensitivity): 14 ng/L (ref <18)

## 2019-09-11 LAB — BASIC METABOLIC PANEL
Anion gap: 8 (ref 5–15)
BUN: 14 mg/dL (ref 6–20)
CO2: 23 mmol/L (ref 22–32)
Calcium: 8.6 mg/dL — ABNORMAL LOW (ref 8.9–10.3)
Chloride: 109 mmol/L (ref 98–111)
Creatinine, Ser: 0.77 mg/dL (ref 0.61–1.24)
GFR calc Af Amer: >60 mL/min (ref >60)
GFR calc non Af Amer: >60 mL/min (ref >60)
Glucose, Bld: 125 mg/dL — ABNORMAL HIGH (ref 70–99)
Potassium: 4 mmol/L (ref 3.5–5.1)
Sodium: 140 mmol/L (ref 135–145)

## 2019-09-11 LAB — CBC
HCT: 33.7 % — ABNORMAL LOW (ref 39.0–52.0)
Hemoglobin: 10.2 g/dL — ABNORMAL LOW (ref 13.0–17.0)
MCH: 25.4 pg — ABNORMAL LOW (ref 26.0–34.0)
MCHC: 30.3 g/dL (ref 30.0–36.0)
MCV: 83.8 fL (ref 80.0–100.0)
Platelets: 143 10*3/uL — ABNORMAL LOW (ref 150–400)
RBC: 4.02 MIL/uL — ABNORMAL LOW (ref 4.22–5.81)
RDW: 16.2 % — ABNORMAL HIGH (ref 11.5–15.5)
WBC: 5.7 10*3/uL (ref 4.0–10.5)
nRBC: 0 % (ref 0.0–0.2)

## 2019-09-11 IMAGING — DX DG CHEST 2V
2 series · 2 of 2 positions shown · non-contrast
Comparison: Chest radiograph dated [DATE].

CLINICAL DATA: 53-year-old male with chest pain and shortness of
breath.

EXAM:
CHEST - 2 VIEW

[chest lat]
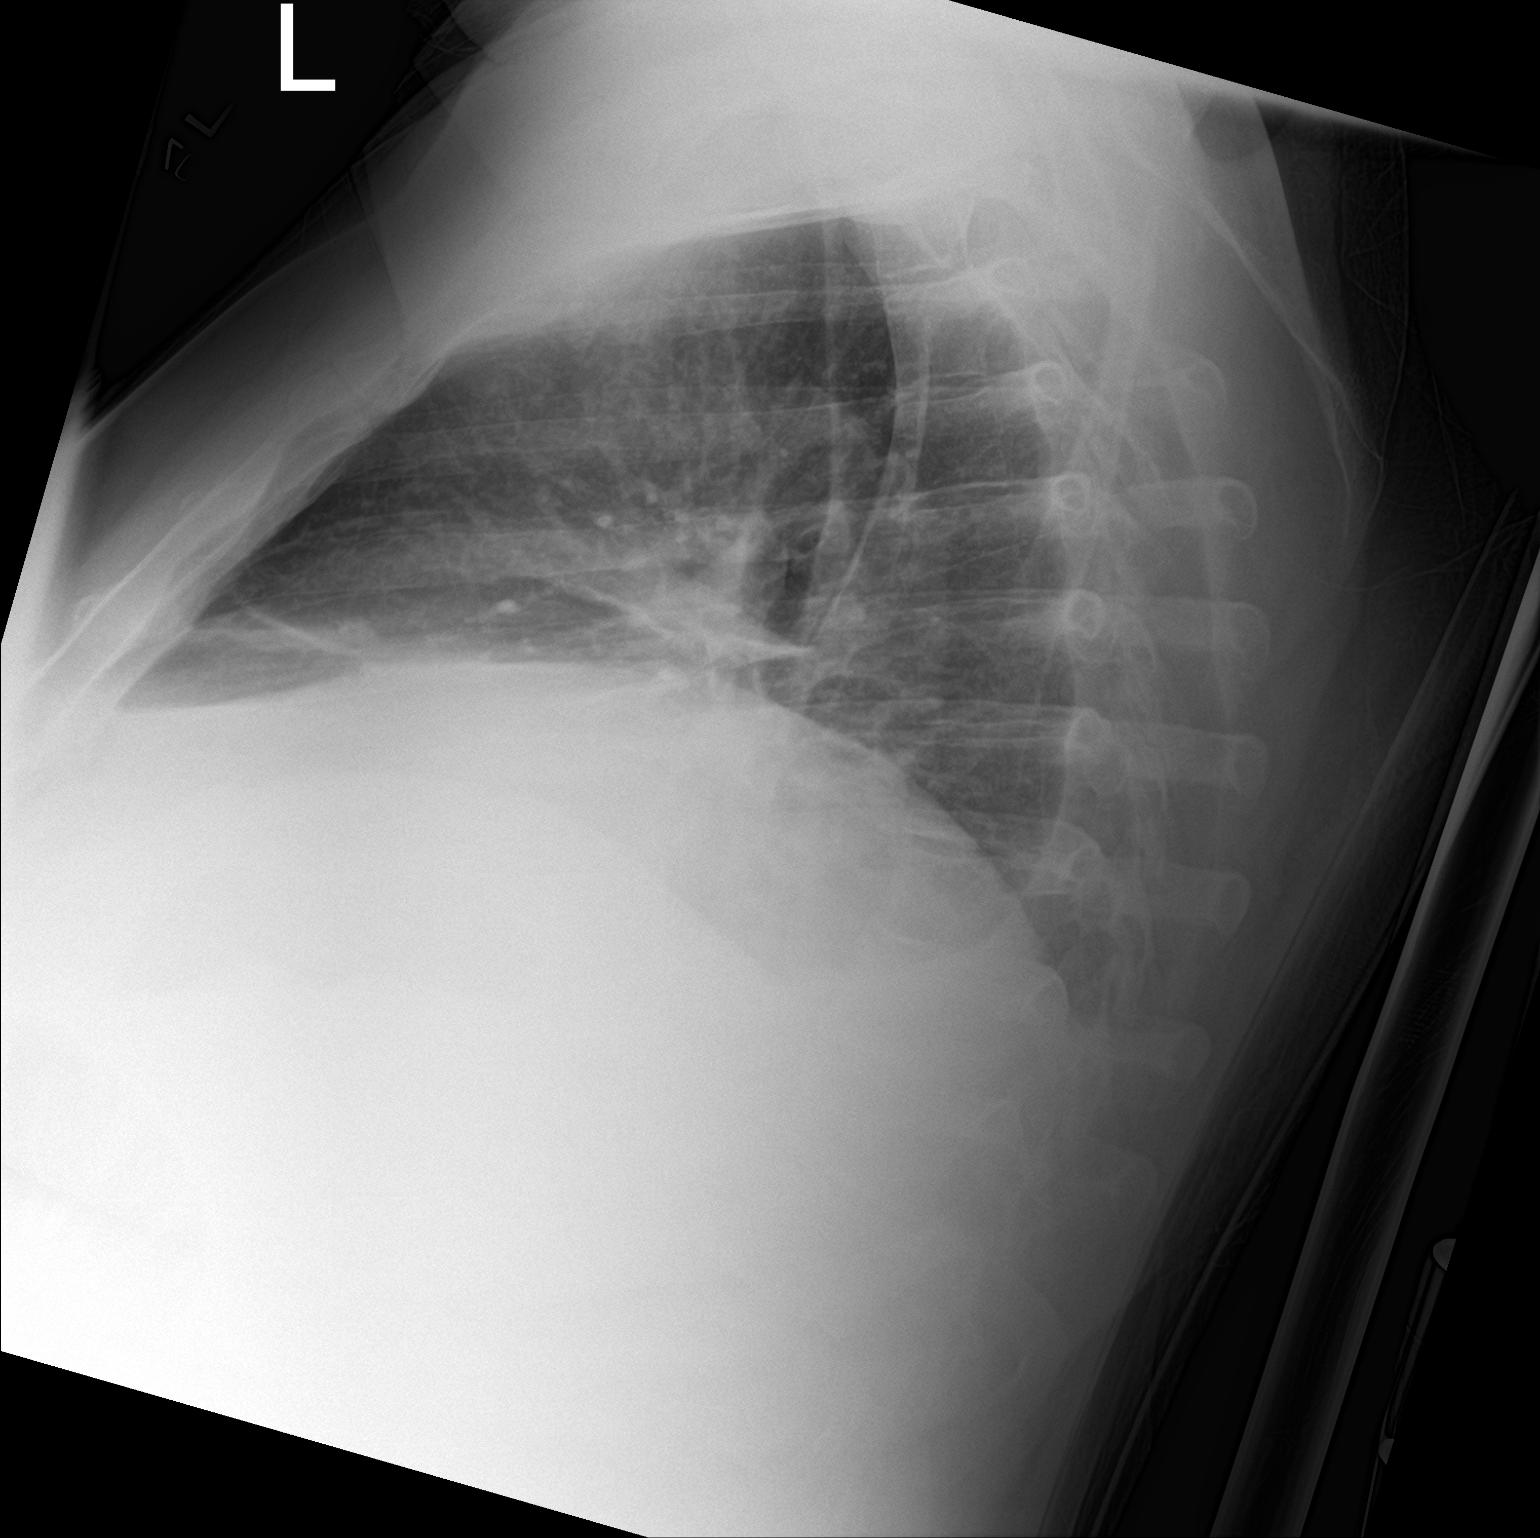

[chest ap]
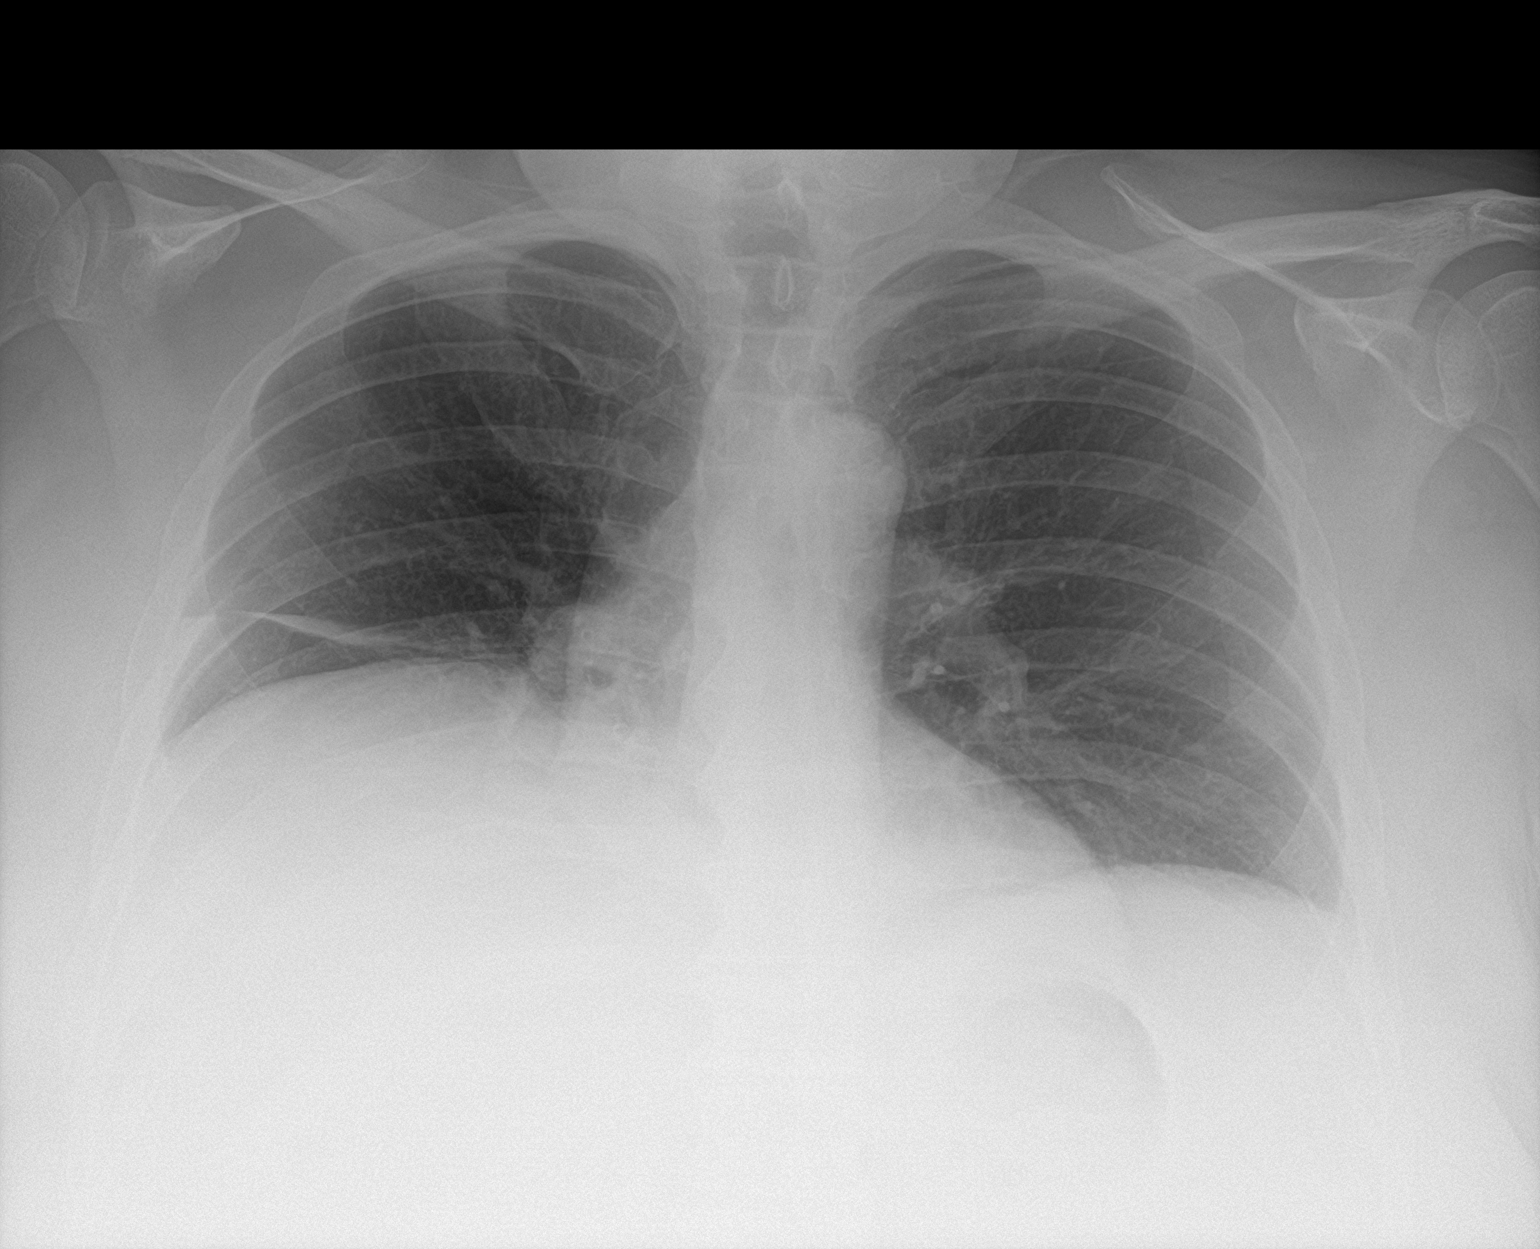

[2 of 2 positions shown; findings below may reference images not displayed]

FINDINGS: There is shallow inspiration with bibasilar atelectasis. No focal
consolidation, pleural effusion, or pneumothorax. Stable cardiac
silhouette. No acute osseous pathology.
IMPRESSION: No acute cardiopulmonary process.

## 2019-09-11 MED ORDER — SODIUM CHLORIDE 0.9% FLUSH: 3.0000 mL | Freq: Once | INTRAVENOUS | Status: DC

## 2019-09-11 NOTE — ED Triage Notes (Signed)
Pt here for worsening shortness of breath x 2 months. Endorses associated chest pain when exerting himself. Pt has severe swelling in bilateral lower extremities, has difficulty getting around and even using the bathroom. Thinks swelling is from the abx he's taking from multiple recent debridement surgeries. Currently has stopped all abx. Just went to GI doctor PTA who told him he has fluid in his lungs and should come to the ER.

## 2019-09-11 NOTE — Patient Instructions (Signed)
If you are age 53 or older, your body mass index should be between 23-30. Your There is no height or weight on file to calculate BMI. If this is out of the aforementioned range listed, please consider follow up with your Primary Care Provider.  If you are age 43 or younger, your body mass index should be between 19-25. Your There is no height or weight on file to calculate BMI. If this is out of the aformentioned range listed, please consider follow up with your Primary Care Provider.   Go to ER.

## 2019-09-11 NOTE — Progress Notes (Signed)
Chief Complaint: "Swelling all over my body" and question of cirrhosis  HPI:    Robert Sullivan is a 54 year old Caucasian male with a past medical history as listed below, who presents to clinic today, as a referral from Dr. Sherre Lain, with a complaint of "swelling all over my body" and question of cirrhosis.    08/14/2019 CMP with an albumin of 2.6, alkaline phosphatase 135, AST 46 and otherwise normal.  CBC with a hemoglobin of 11.1, normal platelets.  Hemoglobin A1c 11.6.    06/10/2019 CT angio chest with no definite evidence of large central pulmonary embolus.  Mild splenomegaly.  Possible nodularity of the liver suggesting possible hepatic cirrhosis.    Today, the patient presents to clinic accompanied by his wife who does assist with his history.  They spend a long time telling me all about his recent hospitalizations for an abscess of his left hip.  Apparently he was in and out of the hospital 2 separate occasions, they are very unimpressed with the care that he has received there.  It ended with a debridement of the wound and a total of about 2-1/2 months of antibiotics.  He is still due to undergo another debridement soon.    Apparently while in the hospital patient had a CT which showed possible cirrhosis.  This is noted above.  Patient tells me that he had never been told he had cirrhosis before, but "they were treating everything but what they needed to".  His biggest complaint today is that he has "swelling all over my body".  Tells me that his legs, scrotum and stomach have grown "3 times the size they were" over the past few weeks.  Tells me this started while he was in the hospital "on so many fluids", but "no one wanted to address it".  Over the past week or so he has had an increasing shortness of breath which is making it hard for him to move around at all, he has had decreased mobility and in fact cannot move himself around anymore and describes weight gain when he should have had a weight loss  after being in the hospital for so long.  Tells me that it is really getting "unbearable".  He is not able to care for himself.    His wife reports that he looked jaundiced in the hospital and thought she heard mention of "liver failure".  She has many questions that she is started to research cirrhosis.    Denies fever, chills or blood in his stool.  Also denies history of alcohol use, blood transfusion, IV drug use, tattoos or history of hepatitis or family history of liver problems.     Past Medical History:  Diagnosis Date  . Anemia   . Anxiety   . Arthritis 05/13/2019   knees  . Depression   . Diabetes mellitus without complication ([place])   . DVT (deep venous thrombosis) ([place])   . Gallstones   . Headache   . HTN (hypertension)   . Hyperlipidemia   . Sleep apnea     Past Surgical History:  Procedure Laterality Date  . I & D EXTREMITY Left 07/16/2019   Procedure: LEFT HIP DEBRIDEMENT;  Surgeon: Newt Minion, MD;  Location: [place];  Service: Orthopedics;  Laterality: Left;  . I & D EXTREMITY Left 07/18/2019   Procedure: REPEAT IRRIGATION AND DEBRIDEMENT LEFT HIP;  Surgeon: Newt Minion, MD;  Location: [place];  Service: Orthopedics;  Laterality: Left;  . INCISION  AND DRAINAGE ABSCESS Left 06/23/2019   Procedure: INCISION AND DRAINAGE GLUTEAL ABSCESS;  Surgeon: Renette Butters, MD;  Location: [place];  Service: Orthopedics;  Laterality: Left;  . INCISION AND DRAINAGE ABSCESS Left 07/09/2019   Procedure: INCISION AND DRAINAGE LEFT HIP/PELVIC ABSCESS, APPLICATION OF NEGATIVE PRESSURE WOUND VAC;  Surgeon: Newt Minion, MD;  Location: [place];  Service: Orthopedics;  Laterality: Left;  . INCISION AND DRAINAGE HIP Left 06/25/2019   Procedure: IRRIGATION AND DEBRIDEMENT HIP;  Surgeon: Shona Needles, MD;  Location: [place];  Service: Orthopedics;  Laterality: Left;  . INCISION AND DRAINAGE OF WOUND Left 07/12/2019   Procedure: LEFT HIP DEBRIDEMENT;  Surgeon: Newt Minion, MD;  Location: [place];   Service: Orthopedics;  Laterality: Left;  . IR US GUIDE BX ASP/DRAIN  06/17/2019  . KNEE ARTHROSCOPY Bilateral    left x 2, 1 right  . LUMBAR DISC SURGERY    . NASAL SINUS SURGERY    . TEE WITHOUT CARDIOVERSION N/A 06/16/2019   Procedure: TRANSESOPHAGEAL ECHOCARDIOGRAM (TEE);  Surgeon: Dorothy Spark, MD;  Location: [facility] ENDOSCOPY;  Service: Cardiovascular;  Laterality: N/A;    Current Outpatient Medications  Medication Sig Dispense Refill  . acetaminophen (TYLENOL) 500 MG tablet Take 500-1,000 mg by mouth as needed.    . Alum & Mag Hydroxide-Simeth (MYLANTA PO) Take 1 Dose by mouth as needed.    Marland Kitchen aspirin 325 MG tablet Take 1 tablet (325 mg total) by mouth daily.    Marland Kitchen bismuth subsalicylate (PEPTO BISMOL) 262 MG/15ML suspension Take 30 mLs by mouth as needed.    . blood glucose meter kit and supplies KIT Dispense based on patient and insurance preference. Use up to four times daily as directed. (FOR ICD-9 250.00, 250.01). 1 each 0  . Ensure Max Protein (ENSURE MAX PROTEIN) LIQD Take 330 mLs (11 oz total) by mouth daily. (Patient taking differently: Take 11 oz by mouth as needed. ) 330 mL 12  . ibuprofen (ADVIL) 200 MG tablet Take 200-600 mg by mouth as needed.    . insulin aspart (NOVOLOG) 100 UNIT/ML injection 3-5 units twice a day    . insulin glargine (LANTUS) 100 UNIT/ML injection Inject 0.24 mLs (24 Units total) into the skin daily. 10 mL 0  . Insulin Syringes, Disposable, U-100 0.5 ML MISC Use as directed 3 times daily AC. 200 each 0  . Multiple Vitamin (MULTIVITAMIN WITH MINERALS) TABS tablet Take 1 tablet by mouth daily.    . vitamin C (ASCORBIC ACID) 500 MG tablet Take 1,000 mg by mouth daily.     No current facility-administered medications for this visit.    Allergies as of 09/11/2019 - Review Complete 09/11/2019  Allergen Reaction Noted  . Sulfa antibiotics Rash 05/27/2019    Family History  Problem Relation Age of Onset  . Hypertension Father     Social History    Socioeconomic History  . Marital status: Married    Spouse name: Not on file  . Number of children: 5  . Years of education: Not on file  . Highest education level: Not on file  Occupational History  . Occupation: Logistics  Tobacco Use  . Smoking status: Former Smoker    Types: Cigars    Quit date: 09/10/2017    Years since quitting: 2.0  . Smokeless tobacco: Never Used  Substance and Sexual Activity  . Alcohol use: Not Currently  . Drug use: Not Currently  . Sexual activity: Not on file  Other  Topics Concern  . Not on file  Social History Narrative  . Not on file   Social Determinants of Health   Financial Resource Strain:   . Difficulty of Paying Living Expenses: Not on file  Food Insecurity:   . Worried About Charity fundraiser in the Last Year: Not on file  . Ran Out of Food in the Last Year: Not on file  Transportation Needs:   . Lack of Transportation (Medical): Not on file  . Lack of Transportation (Non-Medical): Not on file  Physical Activity:   . Days of Exercise per Week: Not on file  . Minutes of Exercise per Session: Not on file  Stress:   . Feeling of Stress : Not on file  Social Connections:   . Frequency of Communication with Friends and Family: Not on file  . Frequency of Social Gatherings with Friends and Family: Not on file  . Attends Religious Services: Not on file  . Active Member of Clubs or Organizations: Not on file  . Attends Archivist Meetings: Not on file  . Marital Status: Not on file  Intimate Partner Violence:   . Fear of Current or Ex-Partner: Not on file  . Emotionally Abused: Not on file  . Physically Abused: Not on file  . Sexually Abused: Not on file    Review of Systems:    Constitutional: No weight loss, fever or chills Skin: No rash  Cardiovascular: No chest pain Respiratory: +SOB Gastrointestinal: See HPI and otherwise negative Genitourinary: No dysuria  Neurological: No headache Musculoskeletal: No new  muscle or joint pain Hematologic: No bleeding  Psychiatric: No history of depression or anxiety   Physical Exam:  Vital signs: BP (!) 146/76 (BP Location: Left Arm, Patient Position: Sitting, Cuff Size: Normal)   Pulse (!) 104   Temp 97.7 F (36.5 C)   Constitutional:  Obese Caucasian male appears to be in mild distress, Well developed, Well nourished, alert and cooperative Head:  Normocephalic and atraumatic. Eyes:   PEERL, EOMI. No icterus. Conjunctiva pink. Ears:  Normal auditory acuity. Neck:  Supple Throat: Oral cavity and pharynx without inflammation, swelling or lesion.  Respiratory: Respirations even and unlabored. +wheezes  Cardiovascular: Normal S1, S2. No MRG. Regular rate and rhythm. No peripheral edema, cyanosis or pallor.  Gastrointestinal:  Soft, moderate distension, nontender. No rebound or guarding. Normal bowel sounds. No appreciable masses or hepatomegaly. Rectal:  Not performed.  Msk:  Symmetrical without gross deformities.2+ b/l edema to level of thigh Neurologic:  Alert and  oriented x4;  grossly normal neurologically.  Skin:   Dry and intact without significant lesions or rashes. Psychiatric:  Demonstrates good judgement and reason without abnormal affect or behaviors.  MOST RECENT LABS AND IMAGING: CBC    Component Value Date/Time   WBC 4.5 07/24/2019 0807   RBC 2.38 (L) 07/24/2019 0807   HGB 7.1 (L) 07/24/2019 0807   HCT 23.1 (L) 07/24/2019 0807   PLT 99 (L) 07/24/2019 0807   MCV 97.1 07/24/2019 0807   MCH 29.8 07/24/2019 0807   MCHC 30.7 07/24/2019 0807   RDW 18.3 (H) 07/24/2019 0807   LYMPHSABS 1.5 07/08/2019 2042   MONOABS 0.5 07/08/2019 2042   EOSABS 0.2 07/08/2019 2042   BASOSABS 0.1 07/08/2019 2042    CMP     Component Value Date/Time   NA 135 07/17/2019 0359   K 4.4 07/17/2019 0359   CL 109 07/17/2019 0359   CO2 21 (L) 07/17/2019 0359  GLUCOSE 140 (H) 07/17/2019 0359   BUN 19 07/17/2019 0359   CREATININE 0.84 07/17/2019 0359    CALCIUM 7.7 (L) 07/17/2019 0359   PROT 6.3 (L) 06/29/2019 0350   ALBUMIN 1.9 (L) 07/10/2019 0036   AST 105 (H) 06/29/2019 0350   ALT 33 06/29/2019 0350   ALKPHOS 260 (H) 06/29/2019 0350   BILITOT 1.1 06/29/2019 0350   GFRNONAA >60 07/17/2019 0359   GFRAA >60 07/17/2019 0359    Assessment: 1.  Cirrhosis?:  Suspected on CT of the chest, no history of this previously 2.  Edema: Most likely this is coming from cirrhosis?  +/- excess fluids given during recent hospitalization which has likely made it worse  Plan: 1.  Discussed with the patient and his wife today that the only imaging I can see is a CT of his chest which discusses possible cirrhosis.  His labs show an elevated AST at 105, platelets are low as well.  This suggests that he has cirrhosis which he has never been told before.  We would need more imaging and labs to confirm such as a specific CT of the abdomen. 2.  His largest complaint today is of edema which he cannot deal with anymore at home.  Also describes some shortness of breath and there is question about pulmonary edema given exam.  He really does not seem like he could function at home over the weekend, discussed that if we did an outpatient work-up he would need a CT abdomen and labs and likely would have to wait until Monday to start him on any sort of diuretic to ensure that his kidneys/electrolytes are okay.  Explained that he could get this work-up quicker in the ER and possibly they would end up admitting him to help pull off the fluid via IV diuretics +/- paracentesis. 3.  Discussed in detail that this could be coming from his liver, but at this point in time we are not really sure.  Likely all this is worse given that he has been in the hospital recently for a prolonged period of time on fluids. 4.  Patient's wife has multiple concerns including about his kidneys.  We briefly discussed this. 5.  At the end of the appointment patient and his wife decided that they would  like to go to the ER to be worked up faster so that he can get some relief from all of this fluid quicker.  They will go to [facility].  Explained that if there is a finding of cirrhosis and he is felt to have liver related ascites then our team could be consulted in the hospital and start him on appropriate therapy for cirrhosis. 85.  Patient's wife will let us know if she has any problems or questions in the interim.  Patient was assigned to Dr. Rush Landmark today.  Over an hour spent in consultation and coordination of care for this patient today.  Ellouise Newer, PA-C [facility] 09/11/2019, 2:20 PM

## 2019-09-12 ENCOUNTER — Telehealth: Payer: Self-pay | Admitting: Physician Assistant

## 2019-09-12 ENCOUNTER — Encounter (HOSPITAL_COMMUNITY): Payer: Self-pay | Admitting: Internal Medicine

## 2019-09-12 ENCOUNTER — Emergency Department (HOSPITAL_COMMUNITY): Payer: Self-pay

## 2019-09-12 ENCOUNTER — Inpatient Hospital Stay (HOSPITAL_COMMUNITY): Payer: Self-pay

## 2019-09-12 ENCOUNTER — Encounter: Payer: Self-pay | Admitting: Physician Assistant

## 2019-09-12 DIAGNOSIS — J90 Pleural effusion, not elsewhere classified: Secondary | ICD-10-CM

## 2019-09-12 DIAGNOSIS — L0231 Cutaneous abscess of buttock: Secondary | ICD-10-CM

## 2019-09-12 DIAGNOSIS — R601 Generalized edema: Secondary | ICD-10-CM | POA: Diagnosis present

## 2019-09-12 DIAGNOSIS — I5031 Acute diastolic (congestive) heart failure: Secondary | ICD-10-CM

## 2019-09-12 DIAGNOSIS — K7460 Unspecified cirrhosis of liver: Secondary | ICD-10-CM

## 2019-09-12 DIAGNOSIS — D696 Thrombocytopenia, unspecified: Secondary | ICD-10-CM

## 2019-09-12 LAB — COMPREHENSIVE METABOLIC PANEL
ALT: 21 U/L (ref 0–44)
AST: 61 U/L — ABNORMAL HIGH (ref 15–41)
Albumin: 2.3 g/dL — ABNORMAL LOW (ref 3.5–5.0)
Alkaline Phosphatase: 126 U/L (ref 38–126)
Anion gap: 9 (ref 5–15)
BUN: 15 mg/dL (ref 6–20)
CO2: 24 mmol/L (ref 22–32)
Calcium: 8.6 mg/dL — ABNORMAL LOW (ref 8.9–10.3)
Chloride: 108 mmol/L (ref 98–111)
Creatinine, Ser: 0.72 mg/dL (ref 0.61–1.24)
GFR calc Af Amer: >60 mL/min (ref >60)
GFR calc non Af Amer: >60 mL/min (ref >60)
Glucose, Bld: 105 mg/dL — ABNORMAL HIGH (ref 70–99)
Potassium: 3.5 mmol/L (ref 3.5–5.1)
Sodium: 141 mmol/L (ref 135–145)
Total Bilirubin: 1.1 mg/dL (ref 0.3–1.2)
Total Protein: 6.7 g/dL (ref 6.5–8.1)

## 2019-09-12 LAB — CBC WITH DIFFERENTIAL/PLATELET
Abs Immature Granulocytes: 0.01 10*3/uL (ref 0.00–0.07)
Basophils Absolute: 0 10*3/uL (ref 0.0–0.1)
Basophils Relative: 1 %
Eosinophils Absolute: 0.3 10*3/uL (ref 0.0–0.5)
Eosinophils Relative: 6 %
HCT: 34.8 % — ABNORMAL LOW (ref 39.0–52.0)
Hemoglobin: 10.5 g/dL — ABNORMAL LOW (ref 13.0–17.0)
Immature Granulocytes: 0 %
Lymphocytes Relative: 24 %
Lymphs Abs: 0.9 10*3/uL (ref 0.7–4.0)
MCH: 25.2 pg — ABNORMAL LOW (ref 26.0–34.0)
MCHC: 30.2 g/dL (ref 30.0–36.0)
MCV: 83.5 fL (ref 80.0–100.0)
Monocytes Absolute: 0.3 10*3/uL (ref 0.1–1.0)
Monocytes Relative: 9 %
Neutro Abs: 2.3 10*3/uL (ref 1.7–7.7)
Neutrophils Relative %: 60 %
Platelets: 125 10*3/uL — ABNORMAL LOW (ref 150–400)
RBC: 4.17 MIL/uL — ABNORMAL LOW (ref 4.22–5.81)
RDW: 16.3 % — ABNORMAL HIGH (ref 11.5–15.5)
WBC: 3.9 10*3/uL — ABNORMAL LOW (ref 4.0–10.5)
nRBC: 0 % (ref 0.0–0.2)

## 2019-09-12 LAB — HEMOGLOBIN A1C
Hgb A1c MFr Bld: 4.6 % — ABNORMAL LOW (ref 4.8–5.6)
Mean Plasma Glucose: 85.32 mg/dL

## 2019-09-12 LAB — SEDIMENTATION RATE: Sed Rate: 15 mm/hr (ref 0–16)

## 2019-09-12 LAB — PROTEIN / CREATININE RATIO, URINE
Creatinine, Urine: 290.34 mg/dL
Protein Creatinine Ratio: 0.11 mg/mg{Cre} (ref 0.00–0.15)
Total Protein, Urine: 33 mg/dL

## 2019-09-12 LAB — BRAIN NATRIURETIC PEPTIDE: B Natriuretic Peptide: 23.2 pg/mL (ref 0.0–100.0)

## 2019-09-12 LAB — SARS CORONAVIRUS 2 (TAT 6-24 HRS): SARS Coronavirus 2: NEGATIVE

## 2019-09-12 LAB — URINALYSIS, ROUTINE W REFLEX MICROSCOPIC
Bilirubin Urine: NEGATIVE
Glucose, UA: NEGATIVE mg/dL
Hgb urine dipstick: NEGATIVE
Ketones, ur: NEGATIVE mg/dL
Leukocytes,Ua: NEGATIVE
Nitrite: NEGATIVE
Protein, ur: 30 mg/dL — AB
Specific Gravity, Urine: 1.03 (ref 1.005–1.030)
pH: 5 (ref 5.0–8.0)

## 2019-09-12 LAB — CBG MONITORING, ED: Glucose-Capillary: 89 mg/dL (ref 70–99)

## 2019-09-12 LAB — TROPONIN I (HIGH SENSITIVITY): Troponin I (High Sensitivity): 5 ng/L (ref <18)

## 2019-09-12 LAB — GLUCOSE, CAPILLARY: Glucose-Capillary: 105 mg/dL — ABNORMAL HIGH (ref 70–99)

## 2019-09-12 LAB — ECHOCARDIOGRAM COMPLETE

## 2019-09-12 LAB — LIPASE, BLOOD: Lipase: 20 U/L (ref 11–51)

## 2019-09-12 LAB — C-REACTIVE PROTEIN: CRP: 3.4 mg/dL — ABNORMAL HIGH (ref <1.0)

## 2019-09-12 IMAGING — CT CT ABD-PELV W/ CM
2 of 8 series · 15 of 46 positions shown, 17 images · IV contrast (APPLIED)
Comparison: CT [DATE]

CLINICAL DATA: Abdominal distension, ascites

EXAM:
CT ABDOMEN AND PELVIS WITH CONTRAST
TECHNIQUE: Multidetector CT imaging of the abdomen and pelvis was performed
using the standard protocol following bolus administration of
intravenous contrast.
CONTRAST:  100mL OMNIPAQUE IOHEXOL 300 MG/ML  SOLN

[Series 3: abd/ pelvis 5.0 i30f 2 · axial · 0.98mm/px · z∈[+729,+1279]mm · 12 of 128 slices shown, 14 images]
[im 9/128  soft-tissue]
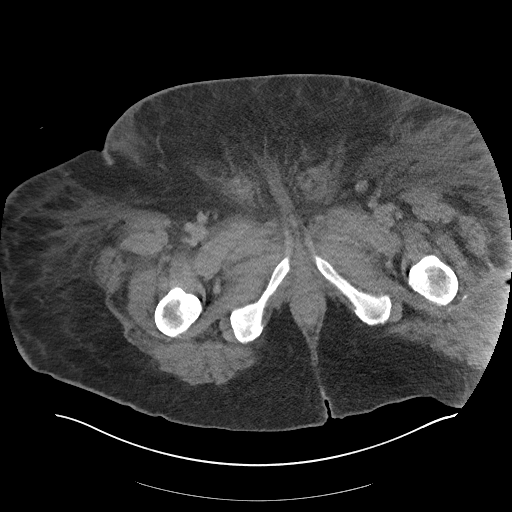
[im 9/128  bone]
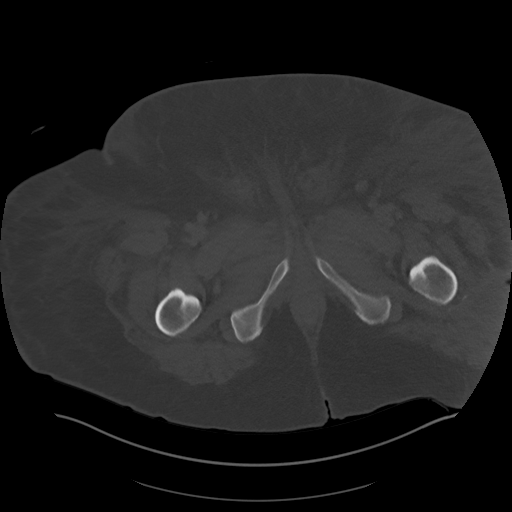
[im 17/128  soft-tissue]
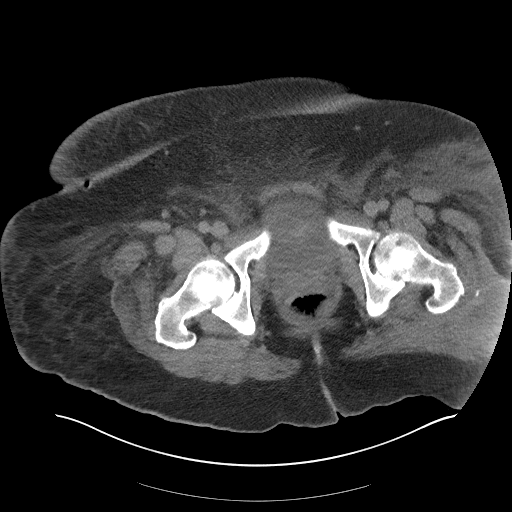
[im 26/128  soft-tissue]
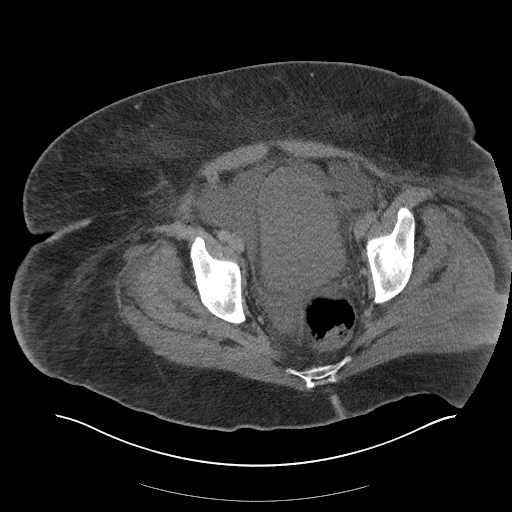
[im 43/128  soft-tissue]
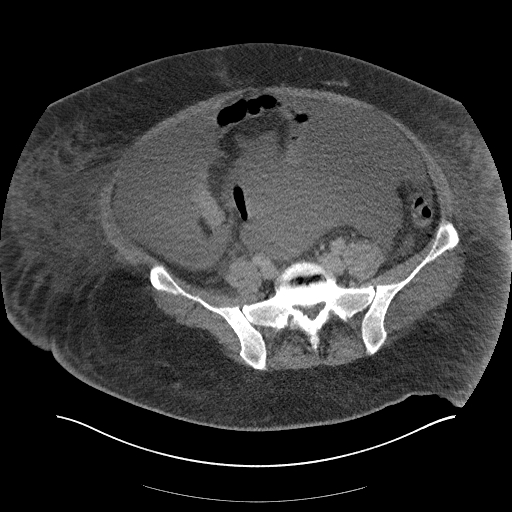
[im 51/128  soft-tissue]
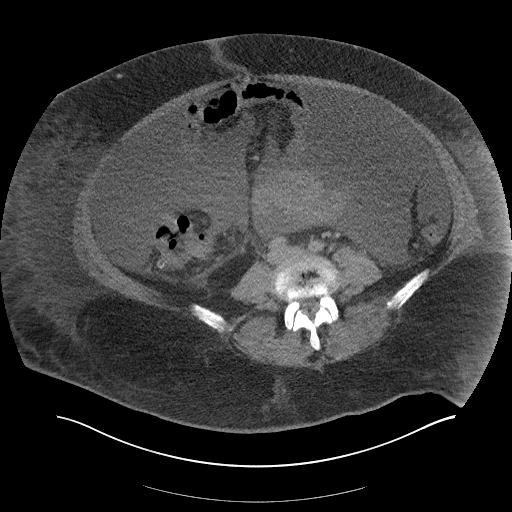
[im 60/128  soft-tissue]
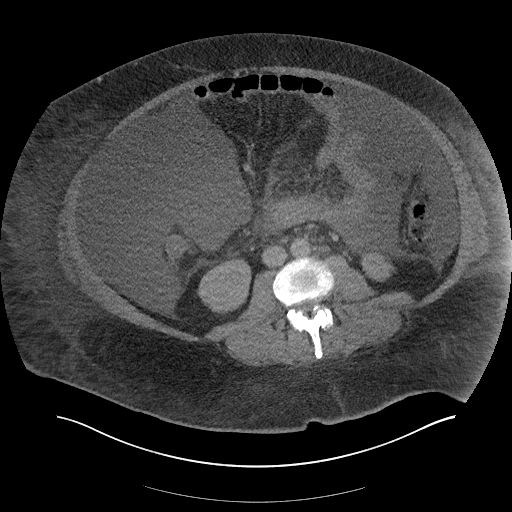
[im 68/128  soft-tissue]
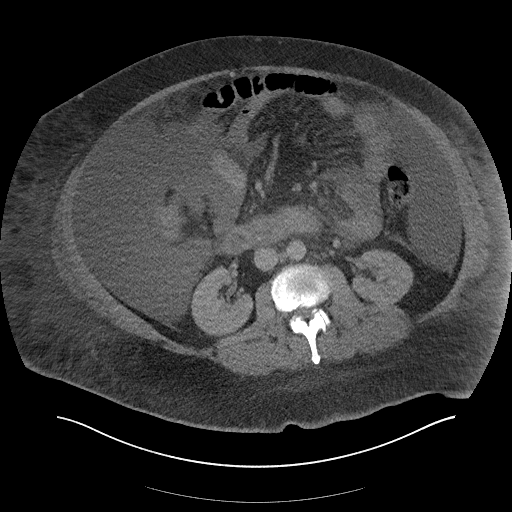
[im 77/128  soft-tissue]
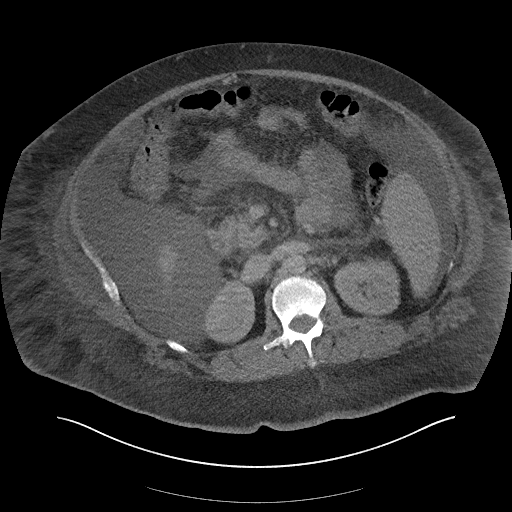
[im 85/128  soft-tissue]
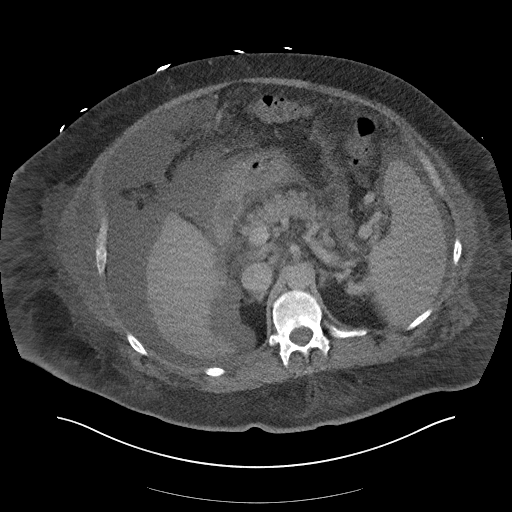
[im 85/128  bone]
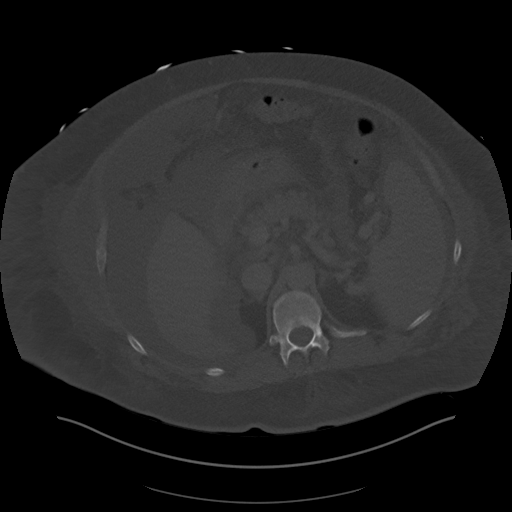
[im 102/128  soft-tissue]
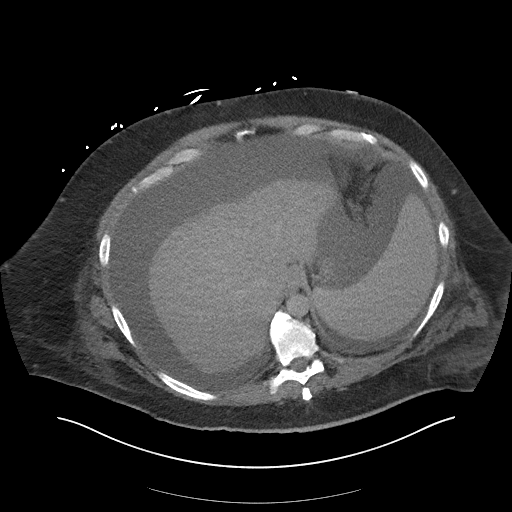
[im 111/128  soft-tissue]
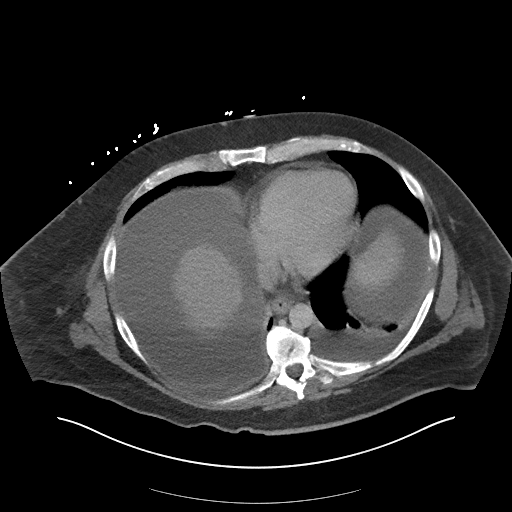
[im 119/128  soft-tissue]
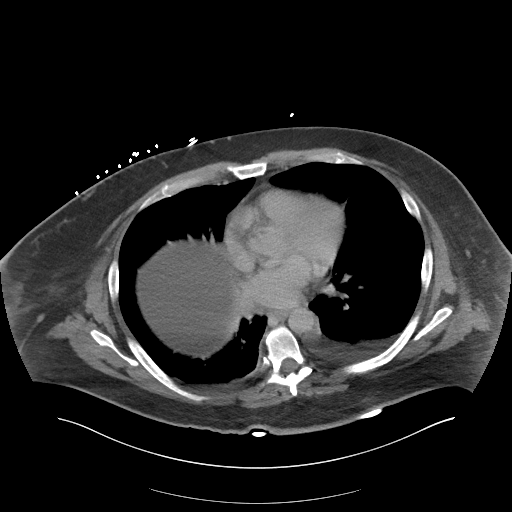

[Series 8: coronal soft tissue · coronal · 1.02mm/px · 3 of 139 slices shown]
[im 35/139  soft-tissue]
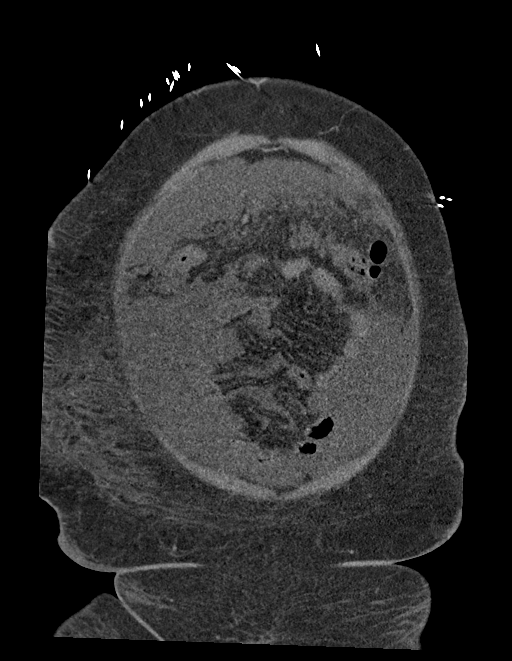
[im 70/139  soft-tissue]
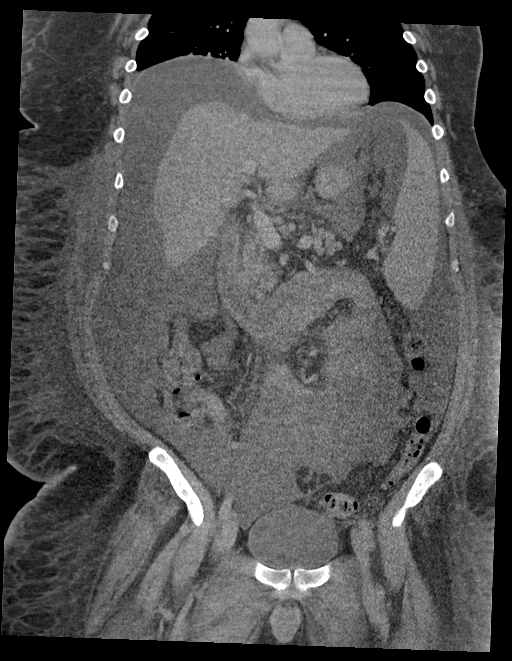
[im 104/139  soft-tissue]
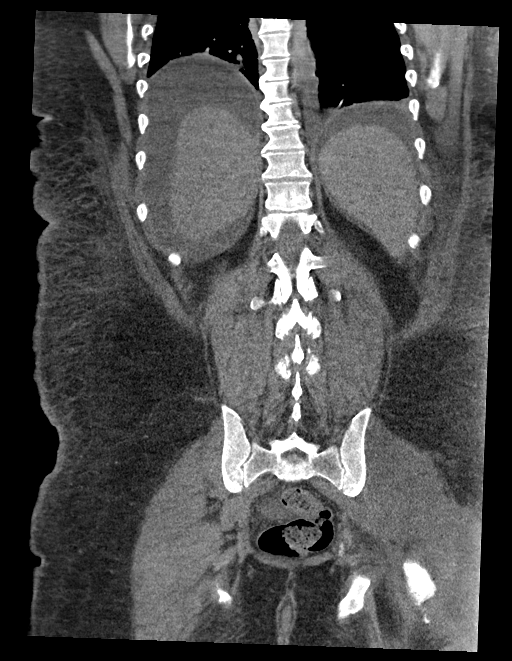

[15 of 46 positions shown; findings below may reference images not displayed]

FINDINGS: Lower chest: Small left and trace right pleural effusions with
associated compressive atelectasis. Normal heart size. No
pericardial effusion.

Hepatobiliary: Shrunken, nodular appearance of the liver suggesting
cirrhosis. No focal liver lesion identified. Prior cholecystectomy.

Pancreas: Appears unremarkable without pancreatic ductal dilatation.
No definite peripancreatic inflammatory changes although fluid from
adjacent ascites slightly limits the evaluation.

Spleen: Splenomegaly. Spleen measures 20 cm in length.

Adrenals/Urinary Tract: Unremarkable adrenal glands. 1.6 cm lower
pole right renal cyst. Kidneys appear otherwise within normal
limits. No renal calculi. No hydronephrosis. Nondilated ureters.
Urinary bladder appears unremarkable.

Stomach/Bowel: Stomach is within normal limits. Appendix not clearly
visualized. Scattered colonic diverticulosis. No evidence of bowel
wall thickening, distention, or inflammatory changes.

Vascular/Lymphatic: Abdominal aorta is nonaneurysmal. Recanalization
of the umbilical vein. Multiple upper abdominal varices suggesting
portal hypertension. Portal vein appears patent. Small upper
abdominal lymph nodes including within the porta hepatis.

Reproductive: Prostate is unremarkable.

Other: Moderate to large volume ascites. No pneumoperitoneum.

Musculoskeletal: Rim enhancing fluid and air collection within the
left gluteal soft tissues at site of previously seen gluteal
abscess. Approximate measurements of the collection are 6.8 x 4.1 cm
trans-axially by 16.7 cm craniocaudally (series 3, image 106; series
10, image 156). Extensive surrounding subcutaneous edema overlying
the left hip and proximal thigh as well as the bilateral flanks. No
acute osseous abnormality. No areas of cortical destruction to
suggest osteomyelitis.
IMPRESSION: 1. Rim-enhancing fluid and air collection within the left gluteal
soft tissues at site of previously seen gluteal abscess. Approximate
measurements of the collection are 6.8 x 4.1 x 16.7 cm. No areas of
underlying cortical destruction to suggest osteomyelitis.
2. Cirrhosis with stigmata of portal hypertension including
splenomegaly and moderate to large volume ascites.
3. Small left and trace right pleural effusions.

These results were called by telephone at the time of interpretation
on [DATE] at [DATE] to provider TIBANE , who verbally
acknowledged these results.

## 2019-09-12 MED ORDER — ACETAMINOPHEN 325 MG PO TABS
650.0000 mg | ORAL_TABLET | Freq: Four times a day (QID) | ORAL | Status: DC | PRN
  Administered 2019-09-13 – 2019-09-21 (×4): 650 mg via ORAL
  Filled 2019-09-12 (×4): qty 2

## 2019-09-12 MED ORDER — VANCOMYCIN HCL 1500 MG/300ML IV SOLN
1500.0000 mg | Freq: Three times a day (TID) | INTRAVENOUS | Status: DC
  Administered 2019-09-12 – 2019-09-13 (×3): 1500 mg via INTRAVENOUS
  Filled 2019-09-12 (×4): qty 300

## 2019-09-12 MED ORDER — INSULIN ASPART 100 UNIT/ML ~~LOC~~ SOLN: 0.0000 [IU] | Freq: Three times a day (TID) | SUBCUTANEOUS | Status: DC

## 2019-09-12 MED ORDER — ENOXAPARIN SODIUM 40 MG/0.4ML ~~LOC~~ SOLN
40.0000 mg | SUBCUTANEOUS | Status: DC
  Administered 2019-09-12 – 2019-09-20 (×6): 40 mg via SUBCUTANEOUS
  Filled 2019-09-12 (×9): qty 0.4

## 2019-09-12 MED ORDER — IOHEXOL 300 MG/ML  SOLN
100.0000 mL | Freq: Once | INTRAMUSCULAR | Status: AC | PRN
  Administered 2019-09-12: 100 mL via INTRAVENOUS

## 2019-09-12 MED ORDER — ENSURE MAX PROTEIN PO LIQD: 11.0000 [oz_av] | Freq: Every day | ORAL | Status: DC

## 2019-09-12 MED ORDER — INSULIN GLARGINE 100 UNIT/ML ~~LOC~~ SOLN
5.0000 [IU] | Freq: Every day | SUBCUTANEOUS | Status: DC
  Administered 2019-09-13: 5 [IU] via SUBCUTANEOUS
  Filled 2019-09-12 (×4): qty 0.05

## 2019-09-12 MED ORDER — SPIRONOLACTONE 25 MG PO TABS: 100.0000 mg | ORAL_TABLET | Freq: Every day | ORAL | Status: DC

## 2019-09-12 MED ORDER — INSULIN ASPART 100 UNIT/ML ~~LOC~~ SOLN
0.0000 [IU] | Freq: Three times a day (TID) | SUBCUTANEOUS | Status: DC
  Administered 2019-09-19 – 2019-09-21 (×6): 1 [IU] via SUBCUTANEOUS
  Administered 2019-09-21: 2 [IU] via SUBCUTANEOUS
  Administered 2019-09-22 – 2019-09-25 (×6): 1 [IU] via SUBCUTANEOUS

## 2019-09-12 MED ORDER — ACETAMINOPHEN 650 MG RE SUPP: 650.0000 mg | Freq: Four times a day (QID) | RECTAL | Status: DC | PRN

## 2019-09-12 MED ORDER — ALBUTEROL SULFATE HFA 108 (90 BASE) MCG/ACT IN AERS
1.0000 | INHALATION_SPRAY | Freq: Once | RESPIRATORY_TRACT | Status: AC
  Administered 2019-09-12: 2 via RESPIRATORY_TRACT
  Filled 2019-09-12: qty 6.7

## 2019-09-12 MED ORDER — ONDANSETRON HCL 4 MG/2ML IJ SOLN
4.0000 mg | Freq: Four times a day (QID) | INTRAMUSCULAR | Status: DC | PRN
  Administered 2019-09-19: 09:00:00 4 mg via INTRAVENOUS
  Filled 2019-09-12: qty 2

## 2019-09-12 MED ORDER — INSULIN GLARGINE 100 UNIT/ML ~~LOC~~ SOLN
40.0000 [IU] | Freq: Every day | SUBCUTANEOUS | Status: DC
  Filled 2019-09-12 (×2): qty 0.4

## 2019-09-12 MED ORDER — AEROCHAMBER PLUS FLO-VU LARGE MISC
1.0000 | Freq: Once | Status: AC
  Administered 2019-09-12: 1

## 2019-09-12 MED ORDER — ONDANSETRON HCL 4 MG PO TABS: 4.0000 mg | ORAL_TABLET | Freq: Four times a day (QID) | ORAL | Status: DC | PRN

## 2019-09-12 MED ORDER — ALBUMIN HUMAN 25 % IV SOLN
25.0000 g | Freq: Four times a day (QID) | INTRAVENOUS | Status: DC
  Administered 2019-09-13: 25 g via INTRAVENOUS
  Filled 2019-09-12 (×3): qty 100

## 2019-09-12 MED ORDER — ADULT MULTIVITAMIN W/MINERALS CH
1.0000 | ORAL_TABLET | Freq: Every day | ORAL | Status: DC
  Administered 2019-09-12 – 2019-09-25 (×14): 1 via ORAL
  Filled 2019-09-12 (×15): qty 1

## 2019-09-12 MED ORDER — FUROSEMIDE 10 MG/ML IJ SOLN
40.0000 mg | Freq: Two times a day (BID) | INTRAMUSCULAR | Status: DC
  Administered 2019-09-12 – 2019-09-18 (×11): 40 mg via INTRAVENOUS
  Filled 2019-09-12 (×12): qty 4

## 2019-09-12 MED ORDER — INSULIN ASPART 100 UNIT/ML ~~LOC~~ SOLN: 0.0000 [IU] | Freq: Every day | SUBCUTANEOUS | Status: DC

## 2019-09-12 MED ORDER — VANCOMYCIN HCL 2000 MG/400ML IV SOLN
2000.0000 mg | Freq: Once | INTRAVENOUS | Status: AC
  Administered 2019-09-12: 2000 mg via INTRAVENOUS
  Filled 2019-09-12: qty 400

## 2019-09-12 MED ORDER — FUROSEMIDE 40 MG PO TABS: 40.0000 mg | ORAL_TABLET | Freq: Every day | ORAL | Status: DC

## 2019-09-12 NOTE — ED Notes (Signed)
Pt not dyspneic in bed, able to speak in full sentences and unlabored. States that he would become dyspneic if he tried to transfer himself into another bed.  Denies drainage from wound to L gluteus at home.

## 2019-09-12 NOTE — Progress Notes (Signed)
Pharmacy Antibiotic Note  Kapil Petropoulos is a 54 y.o. male admitted on 09/11/2019 with cellulitis.  Pharmacy has been consulted for vancomycin dosing.  VSS, AF, WBC 3.9.    Plan: Vancomycin 2000 mg IV x 1, then 1571m IV every 8 hours Goal AUC 400-550. Expected AUC: 480 SCr used: 0.8 Monitor renal function, clinical progression and LOT Vancomycin levels at steady state     Temp (24hrs), Avg:97.9 F (36.6 C), Min:97.7 F (36.5 C), Max:98.1 F (36.7 C)  Recent Labs  Lab 09/11/19 1702 09/12/19 0744  WBC 5.7 3.9*  CREATININE 0.77 0.72    Estimated Creatinine Clearance: 180 mL/min (by C-G formula based on SCr of 0.72 mg/dL).    JBertis Ruddy PharmD Clinical Pharmacist ED Pharmacist Phone # [phone] 69853/11/2019 10:29 AM

## 2019-09-12 NOTE — Telephone Encounter (Signed)
Pt stated that he went to [facility] ER as instructed by Ellouise Newer, PA.  Pt reported that he has been there 17 hours and "the staff have only ran tests to check my heart since I told them that I have chest pain and shortness of breath."    Pt is concerned about his bloating issue but does not feel that his concerns are being taken seriously.  He is worried that he will be discharged without his GI issues being treated.    Pt stated that he was in the hospital "for two months, and all the antibiotics they put me on has hurt my liver."

## 2019-09-12 NOTE — ED Notes (Signed)
Pt taken to CT.

## 2019-09-12 NOTE — ED Notes (Signed)
Admitting at bedside 

## 2019-09-12 NOTE — Consult Note (Addendum)
[facility] Consult: 11:32 AM 09/12/2019  LOS: 0 days    Referring Provider: Dr Alvino Chapel  Primary Care Physician:  Cathleen Corti, PA-C  Primary Gastroenterologist:  1 visit with Ellouise Newer PA-C on 09/11/19, assigned to Dr Rush Landmark.       Reason for Consultation:  Ascites.  Generalized edema.  Cirrhosis.   HPI: Robert Sullivan is a 54 y.o. male.  PMH Morbid obesity, BMI 49.  IDDM, historically poor control.  OSA.  Previous surgeries include lap chole, sinus, lumbar disc, knee arthroscopies. 2D echo and TEE in 06/2019 showed LVEF 60 to 65%, LV hypertrophy and no diastolic dysfunction or significant valvular heart disease.  05/2019 admission w UTI with sepsis, left gluteal tear/hematoma due to fall. Discharged on Bactrim.   12/1 - 12/21 admission w MSSA bacteremia/sepsis in setting of UTI and complex left gluteal abscess.  Abscess treated with drain placement, multiple I&Ds, wound vac, hemovac.  Thrombocytopenia noted during the admission and 06/10/2019 CT angio chest ruled out PE but did show incidental mild splenomegaly and nodular liver.  Pt was not aware of this finding.  T bili max 1.4, alkaline phosphatase max 300,  AST 105 but normal ALT.  Albumin quite low at 1.2.  INR 1.4. MELD and MELD-Na 11.  Anemic and received 6 PRBCs 06/2019 - early 07/2019, FOBT +.   Discharged on to finish  IV cefazolin on 1/19.   07/16/19 and 07/18/2019 underwent repeat left hip I&D.  Discharged on cefepime x17 days.  09/11/19 initial OV w GI PA for generalized edema (with legs, scrotum, abdomen) and likely diagnosis of cirrhosis.  The swelling started during his recent prolonged hospitalization when he noticed that it was hard for him to use the urinal due to scrotal edema.  Additionally has had significant DOE for a couple of months,  chest pressure with exertion. Has not been receiving any diuretics. On yesterday's exam patient had crackles/rales and he was sent to the ED.  He waited a long time but eventually underwent: CTAP with contrast this morning showing: Cirrhosis, stigmata of portal hypertension.  Splenomegaly.  Moderate to large ascites.  Trace bilateral pleural effusions.  6.8 x 4.1 16.7 left gluteal soft tissue fluid/air collection at site of previous abscess.  No evidence for associated osteomyelitis. 2V Chest: unremarkable.     Labs: Other than AST 61, his T bili/alk phos/ALT are normal.  Albumin 2.3. BUN/creatinine, sodium are normal. No coags. No elevation of BNP or cardiac enzymes. Has not had any laboratory work-up for viral hepatitis or AIH related labs.  Denies unusual or excessive bleeding/bruising.  Did require the transfusions during admission in December 2020.  No fever, no chills.  Dyspnea on exertion has progressed over the last few weeks along with the progressive lower extremity edema and ascites.  Urinating well.  No nausea, vomiting.   Weight: 172.8 kg in 05/2019, 167.8 kg 1 week ago.  Social history Patient does not drink alcohol other than on very rare occasions less than a handful of times over the last year, never drinks to  excess.  Not a smoker.  Lives with his wife in [place].  Family history No liver disease, no colorectal cancer.  No GI bleeding, anemia, ulcer disease.   Past Medical History:  Diagnosis Date  . Anemia   . Anxiety   . Arthritis 05/13/2019   knees  . Depression   . Diabetes mellitus without complication ([place])   . DVT (deep venous thrombosis) ([place])   . Gallstones   . Headache   . HTN (hypertension)   . Hyperlipidemia   . Sleep apnea     Past Surgical History:  Procedure Laterality Date  . I & D EXTREMITY Left 07/16/2019   Procedure: LEFT HIP DEBRIDEMENT;  Surgeon: Newt Minion, MD;  Location: [facility];  Service: Orthopedics;  Laterality: Left;  . I & D  EXTREMITY Left 07/18/2019   Procedure: REPEAT IRRIGATION AND DEBRIDEMENT LEFT HIP;  Surgeon: Newt Minion, MD;  Location: [facility];  Service: Orthopedics;  Laterality: Left;  . INCISION AND DRAINAGE ABSCESS Left 06/23/2019   Procedure: INCISION AND DRAINAGE GLUTEAL ABSCESS;  Surgeon: Renette Butters, MD;  Location: [facility];  Service: Orthopedics;  Laterality: Left;  . INCISION AND DRAINAGE ABSCESS Left 07/09/2019   Procedure: INCISION AND DRAINAGE LEFT HIP/PELVIC ABSCESS, APPLICATION OF NEGATIVE PRESSURE WOUND VAC;  Surgeon: Newt Minion, MD;  Location: [facility];  Service: Orthopedics;  Laterality: Left;  . INCISION AND DRAINAGE HIP Left 06/25/2019   Procedure: IRRIGATION AND DEBRIDEMENT HIP;  Surgeon: Shona Needles, MD;  Location: [facility];  Service: Orthopedics;  Laterality: Left;  . INCISION AND DRAINAGE OF WOUND Left 07/12/2019   Procedure: LEFT HIP DEBRIDEMENT;  Surgeon: Newt Minion, MD;  Location: [place] [facility];  Service: Orthopedics;  Laterality: Left;  . IR US GUIDE BX ASP/DRAIN  06/17/2019  . KNEE ARTHROSCOPY Bilateral    left x 2, 1 right  . LUMBAR DISC SURGERY    . NASAL SINUS SURGERY    . TEE WITHOUT CARDIOVERSION N/A 06/16/2019   Procedure: TRANSESOPHAGEAL ECHOCARDIOGRAM (TEE);  Surgeon: Dorothy Spark, MD;  Location: [facility];  Service: Cardiovascular;  Laterality: N/A;    Prior to Admission medications   Medication Sig Start Date End Date Taking? Authorizing Provider  aspirin 325 MG tablet Take 1 tablet (325 mg total) by mouth daily. Patient taking differently: Take 325 mg by mouth every 6 (six) hours as needed for mild pain.  07/25/19  Yes Persons, Bevely Palmer, PA  insulin aspart (NOVOLOG) 100 UNIT/ML injection Inject 4 Units into the skin in the morning and at bedtime.    Yes [provider]  insulin glargine (LANTUS) 100 UNIT/ML injection Inject 0.24 mLs (24 Units total) into the skin daily. Patient taking differently: Inject 50 Units into the skin daily.  07/01/19  Yes  Hongalgi, Lenis Dickinson, MD  blood glucose meter kit and supplies KIT Dispense based on patient and insurance preference. Use up to four times daily as directed. (FOR ICD-9 250.00, 250.01). 06/30/19   Hongalgi, Lenis Dickinson, MD  Ensure Max Protein (ENSURE MAX PROTEIN) LIQD Take 330 mLs (11 oz total) by mouth daily. Patient not taking: Reported on 09/12/2019 07/01/19   Modena Jansky, MD  Insulin Syringes, Disposable, U-100 0.5 ML MISC Use as directed 3 times daily AC. 06/30/19   Hongalgi, Lenis Dickinson, MD  Multiple Vitamin (MULTIVITAMIN WITH MINERALS) TABS tablet Take 1 tablet by mouth daily. Patient not taking: Reported on 09/12/2019 07/01/19   Modena Jansky, MD  Scheduled Meds: . sodium chloride flush  3 mL Intravenous Once   Infusions: . vancomycin    . vancomycin 2,000 mg (09/12/19 1057)   PRN Meds:    Allergies as of 09/11/2019 - Review Complete 09/11/2019  Allergen Reaction Noted  . Sulfa antibiotics Rash 05/27/2019    Family History  Problem Relation Age of Onset  . Hypertension Father     Social History   Socioeconomic History  . Marital status: Married    Spouse name: Not on file  . Number of children: 5  . Years of education: Not on file  . Highest education level: Not on file  Occupational History  . Occupation: Logistics  Tobacco Use  . Smoking status: Former Smoker    Types: Cigars    Quit date: 09/10/2017    Years since quitting: 2.0  . Smokeless tobacco: Never Used  Substance and Sexual Activity  . Alcohol use: Not Currently  . Drug use: Not Currently  . Sexual activity: Not on file  Other Topics Concern  . Not on file  Social History Narrative  . Not on file   Social Determinants of Health   Financial Resource Strain:   . Difficulty of Paying Living Expenses: Not on file  Food Insecurity:   . Worried About Charity fundraiser in the Last Year: Not on file  . Ran Out of Food in the Last Year: Not on file  Transportation Needs:   . Lack of  Transportation (Medical): Not on file  . Lack of Transportation (Non-Medical): Not on file  Physical Activity:   . Days of Exercise per Week: Not on file  . Minutes of Exercise per Session: Not on file  Stress:   . Feeling of Stress : Not on file  Social Connections:   . Frequency of Communication with Friends and Family: Not on file  . Frequency of Social Gatherings with Friends and Family: Not on file  . Attends Religious Services: Not on file  . Active Member of Clubs or Organizations: Not on file  . Attends Archivist Meetings: Not on file  . Marital Status: Not on file  Intimate Partner Violence:   . Fear of Current or Ex-Partner: Not on file  . Emotionally Abused: Not on file  . Physically Abused: Not on file  . Sexually Abused: Not on file    REVIEW OF SYSTEMS: Constitutional: Weakness, limited ambulation. ENT:  No nose bleeds Pulm: Dyspnea on exertion but not at rest.  No cough. CV: Mild exertional chest pressure.  No palpitations, no LE edema.  GU:  No hematuria, no frequency.  No orange or dark urine. GI: See HPI. Heme: No unusual or excessive bleeding or bruising Transfusions: See HPI. Neuro: No confusion, no excessive somnolence.  No tremors.  No headaches, no peripheral tingling or numbness.  History of frequent falls which is what led up to him developing the left hip hematoma last year. Derm:  No itching, no rash or sores.  Endocrine: Home blood glucose have been running 80s to 120s recently, improved compared with several months ago.  No sweats or chills.  No polyuria or dysuria Immunization: Not queried. Travel:  None beyond local counties in last few months.    PHYSICAL EXAM: Vital signs in last 24 hours: Vitals:   09/12/19 1030 09/12/19 1040  BP:  133/89  Pulse: 95 100  Resp: 19 (!) 21  Temp:    SpO2: 97% 98%   Wt Readings from Last  3 Encounters:  09/04/19 (!) 167.8 kg  08/14/19 (!) 167.8 kg  08/05/19 (!) 167.8 kg    General: Morbidly  obese white male who looks chronically ill.  Alert.  Comfortable. Head: No facial asymmetry or swelling.  No signs of head trauma. Eyes: No scleral icterus, no conjunctival pallor.  EOMI. Ears: Not hard of hearing Nose: No discharge or congestion Mouth: Tongue midline.  Mucosa pink, moist, clear.  Good dentition. Neck: Obese.  No JVD, no masses, no thyromegaly Lungs: No labored breathing.  Lungs clear bilaterally. Heart: RRR.  No MRG.  S1, S2 audible. Abdomen: Large, obese.  Swelling in the flanks which is firm.  The top of his abdomen is soft.  No tenderness.  Positive fluid wave.  Unable to appreciate organomegaly, hernias, bruits, masses..   Rectal: Deferred. Musc/Skeltl: No obvious sarcopenia. Extremities: Significant edema and anasarca w pitting up the legs into the hips Neurologic: No tremor or asterixis.  Alert.  Oriented x3.  Detailed historian. Skin: Slight erythema in the lower legs but does not look like cellulitis. Nodes: No cervical adenopathy. Psych: Cooperative.  Fluid speech.  Detailed historian.  Affect normal.  Intake/Output from previous day: No intake/output data recorded. Intake/Output this shift: No intake/output data recorded.  LAB RESULTS: Recent Labs    09/11/19 1702 09/12/19 0744  WBC 5.7 3.9*  HGB 10.2* 10.5*  HCT 33.7* 34.8*  PLT 143* 125*   BMET Lab Results  Component Value Date   NA 141 09/12/2019   NA 140 09/11/2019   NA 135 07/17/2019   K 3.5 09/12/2019   K 4.0 09/11/2019   K 4.4 07/17/2019   CL 108 09/12/2019   CL 109 09/11/2019   CL 109 07/17/2019   CO2 24 09/12/2019   CO2 23 09/11/2019   CO2 21 (L) 07/17/2019   GLUCOSE 105 (H) 09/12/2019   GLUCOSE 125 (H) 09/11/2019   GLUCOSE 140 (H) 07/17/2019   BUN 15 09/12/2019   BUN 14 09/11/2019   BUN 19 07/17/2019   CREATININE 0.72 09/12/2019   CREATININE 0.77 09/11/2019   CREATININE 0.84 07/17/2019   CALCIUM 8.6 (L) 09/12/2019   CALCIUM 8.6 (L) 09/11/2019   CALCIUM 7.7 (L) 07/17/2019    LFT Recent Labs    09/12/19 0744  PROT 6.7  ALBUMIN 2.3*  AST 61*  ALT 21  ALKPHOS 126  BILITOT 1.1   PT/INR Lab Results  Component Value Date   INR 1.3 (H) 06/22/2019   INR 1.4 (H) 06/16/2019   INR 1.4 (H) 06/10/2019   Hepatitis Panel No results for input(s): HEPBSAG, HCVAB, HEPAIGM, HEPBIGM in the last 72 hours. C-Diff No components found for: CDIFF Lipase     Component Value Date/Time   LIPASE 20 09/12/2019 0744    Drugs of Abuse  No results found for: LABOPIA, COCAINSCRNUR, LABBENZ, AMPHETMU, THCU, LABBARB   RADIOLOGY STUDIES: DG Chest 2 View  Result Date: 09/11/2019 CLINICAL DATA:  54 year old male with chest pain and shortness of breath. EXAM: CHEST - 2 VIEW COMPARISON:  Chest radiograph dated 06/19/2019. FINDINGS: There is shallow inspiration with bibasilar atelectasis. No focal consolidation, pleural effusion, or pneumothorax. Stable cardiac silhouette. No acute osseous pathology. IMPRESSION: No acute cardiopulmonary process. Electronically Signed   By: Anner Crete M.D.   On: 09/11/2019 17:57   CT ABDOMEN PELVIS W CONTRAST  Result Date: 09/12/2019 CLINICAL DATA:  Abdominal distension, ascites EXAM: CT ABDOMEN AND PELVIS WITH CONTRAST TECHNIQUE: Multidetector CT imaging of the abdomen and pelvis was performed using  the standard protocol following bolus administration of intravenous contrast. CONTRAST:  135m OMNIPAQUE IOHEXOL 300 MG/ML  SOLN COMPARISON:  CT 06/20/2019 FINDINGS: Lower chest: Small left and trace right pleural effusions with associated compressive atelectasis. Normal heart size. No pericardial effusion. Hepatobiliary: Shrunken, nodular appearance of the liver suggesting cirrhosis. No focal liver lesion identified. Prior cholecystectomy. Pancreas: Appears unremarkable without pancreatic ductal dilatation. No definite peripancreatic inflammatory changes although fluid from adjacent ascites slightly limits the evaluation. Spleen: Splenomegaly. Spleen  measures 20 cm in length. Adrenals/Urinary Tract: Unremarkable adrenal glands. 1.6 cm lower pole right renal cyst. Kidneys appear otherwise within normal limits. No renal calculi. No hydronephrosis. Nondilated ureters. Urinary bladder appears unremarkable. Stomach/Bowel: Stomach is within normal limits. Appendix not clearly visualized. Scattered colonic diverticulosis. No evidence of bowel wall thickening, distention, or inflammatory changes. Vascular/Lymphatic: Abdominal aorta is nonaneurysmal. Recanalization of the umbilical vein. Multiple upper abdominal varices suggesting portal hypertension. Portal vein appears patent. Small upper abdominal lymph nodes including within the porta hepatis. Reproductive: Prostate is unremarkable. Other: Moderate to large volume ascites. No pneumoperitoneum. Musculoskeletal: Rim enhancing fluid and air collection within the left gluteal soft tissues at site of previously seen gluteal abscess. Approximate measurements of the collection are 6.8 x 4.1 cm trans-axially by 16.7 cm craniocaudally (series 3, image 106; series 10, image 156). Extensive surrounding subcutaneous edema overlying the left hip and proximal thigh as well as the bilateral flanks. No acute osseous abnormality. No areas of cortical destruction to suggest osteomyelitis. IMPRESSION: 1. Rim-enhancing fluid and air collection within the left gluteal soft tissues at site of previously seen gluteal abscess. Approximate measurements of the collection are 6.8 x 4.1 x 16.7 cm. No areas of underlying cortical destruction to suggest osteomyelitis. 2. Cirrhosis with stigmata of portal hypertension including splenomegaly and moderate to large volume ascites. 3. Small left and trace right pleural effusions. These results were called by telephone at the time of interpretation on 09/12/2019 at 10:18 am to provider [facility], who verbally acknowledged these results. Electronically Signed   By: NDavina PokeD.O.   On:  09/12/2019 10:19     IMPRESSION:   *    Cirrhosis of the liver, decompensated with ascites/anasarca.  CT reveals splenomegaly and ascites consistent with portal hypertension, however varices were not seen.  Without INR unable to calculate MELD which was 11 on 06/16/19  *     Normocytic anemia.  Current Hgb 10.5 improved compared with 7.1 in mid January.  Received 6 PRBCs 06/2019 -early 07/2019 FOBT positive 06/10/2019 but no gross bleeding or melena.  *    Complicated left hip abscess, status post I&D x 6.  Several weeks of IV antibiotics in the past 3 months.  *    Splenomegaly.  Intermittent thrombocytopenia, currently platelets normal.  *    Trace, bilateral pleural effusions.  Normal/unremarkable 2D echo and TEE in 06/2019  *    IDDM.  ot well controlled in 06/2019 but his self-reported current numbers suggest improved control.  A1c 5.5 on 07/10/2019.  Suspect hyperglycemia during December was due to active infection.  *   [employer] self pay for medical care at this point.  PLAN:     *    Paracentesis of up to 5 L of fluid and send studies for rule out SBP, albumin.  Administer IV albumin x 3 doses after paracentesis, this may help with his fluid retention.  *   Start diuretics.  Inclined to start with Lasix 40, Aldactone  100 mg daily, start rmrw  given potential deleterious impact on kidneys of paracentesis.  *    Follow bmet. AM Labs ordered for viral hepatitis and other causes for liver disease including  Hep ABC serologies, ANA, smooth muscle/mitochondrial Ab, ceruloplasmin. INR.   Calculate MELD once we have INR.    *    Eventually will need EGD for variceal screening, screening colonoscopy.  This is not urgent.    *    Does patient qualify for Medicare or Medicaid at this point?  He certainly would benefit from healthcare insurance of some sort.    Azucena Freed  09/12/2019, 11:32 AM Phone [phone]      Attending Physician Note   I have taken a  history, examined the patient and reviewed the chart. I agree with the Advanced Practitioner's note, impression and recommendations.  Decompensated cirrhosis, with ascites/anasarca, splenomegaly, suspected NASH.  Large volume paracentesis send fluid for cell counts, albumin and culture Start standard diuretic regimen for ascites: lasix 40 mg, aldactone 100 mg Daily weights 2 g Na diet Send standard serologies to evaluate for other causes of liver disease.   Lucio Edward, MD [facility]

## 2019-09-12 NOTE — Consult Note (Addendum)
Chief Complaint: Patient was seen in consultation today for left gluteal/thigh abscess  Referring Physician(s): Dr. Carles Collet  Supervising Physician: Sandi Mariscal  Patient Status: [facility] - In-pt  History of Present Illness: Robert Sullivan is a 54 y.o. male with past medical history of anemia, anxiety, depression, DM, DVT, HTN, known to Interventional Radiology from recent left gluteal abscess 06/17/19.  In summary, patient sustained a left hip injury after a fall at home in October 2020.  An MR left hip showed a left gluteus maximus tear with 50 mL hematoma with mild surrounding edema. He has had several repeat admission since the initial injury due to recurrent infections requiring aspiration and I&D.  His 06/17/19 aspiration grew MSSA.  A drain was placed in the collection, and was ultimately removed. He underwent surgical debridements.  Note that 07/09/19 cultures grew Enterobacter, and a culture 07/16/19 grew Enterococcus faecalis.  He was maintained on several IV antibiotics under the direction of ID and his symptoms improved.  He has not been on antibiotics for the past several weeks.   Patient assessed in the ED this afternoon.  He endorses a small amount of mostly clear drainage from his left thigh along his incision. He denies fevers, chills, or pus. States that he has continued to follow-up with Dr. Sharol Given for his orthopedic care, but has recently noticed increased swelling in his abdomen, legs, feet, and scrotum.  He has had difficulty with mobility at home due to the swelling.  He presented to a GI doctor for evaluation and was referred to the ED due to his multiple symptoms, worsening at home.    He underwent CT Abdomen Pelvis today which showed:  1. Rim-enhancing fluid and air collection within the left gluteal soft tissues at site of previously seen gluteal abscess. Approximate measurements of the collection are 6.8 x 4.1 x 16.7 cm. No areas of underlying cortical destruction to suggest  osteomyelitis. 2. Cirrhosis with stigmata of portal hypertension including splenomegaly and moderate to large volume ascites. 3. Small left and trace right pleural effusions.  IR consulted for possible left hip aspiration and drainage.   Past Medical History:  Diagnosis Date  . Anemia   . Anxiety   . Arthritis 05/13/2019   knees  . Depression   . Diabetes mellitus without complication ([place])   . DVT (deep venous thrombosis) ([place])   . Gallstones   . Headache   . HTN (hypertension)   . Hyperlipidemia   . Sleep apnea     Past Surgical History:  Procedure Laterality Date  . I & D EXTREMITY Left 07/16/2019   Procedure: LEFT HIP DEBRIDEMENT;  Surgeon: Newt Minion, MD;  Location: [place];  Service: Orthopedics;  Laterality: Left;  . I & D EXTREMITY Left 07/18/2019   Procedure: REPEAT IRRIGATION AND DEBRIDEMENT LEFT HIP;  Surgeon: Newt Minion, MD;  Location: [place];  Service: Orthopedics;  Laterality: Left;  . INCISION AND DRAINAGE ABSCESS Left 06/23/2019   Procedure: INCISION AND DRAINAGE GLUTEAL ABSCESS;  Surgeon: Renette Butters, MD;  Location: [place];  Service: Orthopedics;  Laterality: Left;  . INCISION AND DRAINAGE ABSCESS Left 07/09/2019   Procedure: INCISION AND DRAINAGE LEFT HIP/PELVIC ABSCESS, APPLICATION OF NEGATIVE PRESSURE WOUND VAC;  Surgeon: Newt Minion, MD;  Location: [place];  Service: Orthopedics;  Laterality: Left;  . INCISION AND DRAINAGE HIP Left 06/25/2019   Procedure: IRRIGATION AND DEBRIDEMENT HIP;  Surgeon: Shona Needles, MD;  Location: [place];  Service: Orthopedics;  Laterality: Left;  . INCISION AND DRAINAGE OF WOUND Left 07/12/2019   Procedure: LEFT HIP DEBRIDEMENT;  Surgeon: Newt Minion, MD;  Location: [place];  Service: Orthopedics;  Laterality: Left;  . IR US GUIDE BX ASP/DRAIN  06/17/2019  . KNEE ARTHROSCOPY Bilateral    left x 2, 1 right  . LAPAROSCOPIC CHOLECYSTECTOMY  ~ 2010  . LUMBAR DISC SURGERY    . NASAL SINUS SURGERY    . TEE WITHOUT  CARDIOVERSION N/A 06/16/2019   Procedure: TRANSESOPHAGEAL ECHOCARDIOGRAM (TEE);  Surgeon: Dorothy Spark, MD;  Location: [facility] ENDOSCOPY;  Service: Cardiovascular;  Laterality: N/A;    Allergies: Sulfa antibiotics  Medications: Prior to Admission medications   Medication Sig Start Date End Date Taking? Authorizing Provider  aspirin 325 MG tablet Take 1 tablet (325 mg total) by mouth daily. Patient taking differently: Take 325 mg by mouth every 6 (six) hours as needed for mild pain.  07/25/19  Yes Persons, Bevely Palmer, PA  insulin aspart (NOVOLOG) 100 UNIT/ML injection Inject 4 Units into the skin in the morning and at bedtime.    Yes [provider]  insulin glargine (LANTUS) 100 UNIT/ML injection Inject 0.24 mLs (24 Units total) into the skin daily. Patient taking differently: Inject 50 Units into the skin daily.  07/01/19  Yes Hongalgi, Lenis Dickinson, MD  blood glucose meter kit and supplies KIT Dispense based on patient and insurance preference. Use up to four times daily as directed. (FOR ICD-9 250.00, 250.01). 06/30/19   Hongalgi, Lenis Dickinson, MD  Ensure Max Protein (ENSURE MAX PROTEIN) LIQD Take 330 mLs (11 oz total) by mouth daily. Patient not taking: Reported on 09/12/2019 07/01/19   Modena Jansky, MD  Insulin Syringes, Disposable, U-100 0.5 ML MISC Use as directed 3 times daily AC. 06/30/19   Hongalgi, Lenis Dickinson, MD  Multiple Vitamin (MULTIVITAMIN WITH MINERALS) TABS tablet Take 1 tablet by mouth daily. Patient not taking: Reported on 09/12/2019 07/01/19   Modena Jansky, MD     Family History  Problem Relation Age of Onset  . Hypertension Father     Social History   Socioeconomic History  . Marital status: Married    Spouse name: Not on file  . Number of children: 5  . Years of education: Not on file  . Highest education level: Not on file  Occupational History  . Occupation: Logistics  Tobacco Use  . Smoking status: Former Smoker    Types: Cigars    Quit date:  09/10/2017    Years since quitting: 2.0  . Smokeless tobacco: Never Used  Substance and Sexual Activity  . Alcohol use: Not Currently  . Drug use: Not Currently  . Sexual activity: Not on file  Other Topics Concern  . Not on file  Social History Narrative  . Not on file   Social Determinants of Health   Financial Resource Strain:   . Difficulty of Paying Living Expenses: Not on file  Food Insecurity:   . Worried About Charity fundraiser in the Last Year: Not on file  . Ran Out of Food in the Last Year: Not on file  Transportation Needs:   . Lack of Transportation (Medical): Not on file  . Lack of Transportation (Non-Medical): Not on file  Physical Activity:   . Days of Exercise per Week: Not on file  . Minutes of Exercise per Session: Not on file  Stress:   . Feeling of Stress : Not on file  Social  Connections:   . Frequency of Communication with Friends and Family: Not on file  . Frequency of Social Gatherings with Friends and Family: Not on file  . Attends Religious Services: Not on file  . Active Member of Clubs or Organizations: Not on file  . Attends Archivist Meetings: Not on file  . Marital Status: Not on file     Review of Systems: A 12 point ROS discussed and pertinent positives are indicated in the HPI above.  All other systems are negative.  Review of Systems  Constitutional: Negative for fatigue and fever.  Respiratory: Positive for shortness of breath. Negative for cough.   Gastrointestinal: Positive for abdominal distention. Negative for abdominal pain, nausea and vomiting.  Genitourinary: Positive for scrotal swelling. Negative for dysuria.  Musculoskeletal: Negative for back pain.  Psychiatric/Behavioral: Negative for behavioral problems and confusion.    Vital Signs: BP 124/62 (BP Location: Left Arm)   Pulse 87   Temp 97.8 F (36.6 C) (Oral)   Resp 16   SpO2 97%   Physical Exam Vitals and nursing note reviewed.  Constitutional:       General: He is not in acute distress.    Appearance: He is not ill-appearing.  HENT:     Mouth/Throat:     Mouth: Mucous membranes are moist.     Pharynx: Oropharynx is clear.  Cardiovascular:     Rate and Rhythm: Normal rate and regular rhythm.  Pulmonary:     Effort: Pulmonary effort is normal. No respiratory distress.     Breath sounds: Normal breath sounds.  Abdominal:     General: There is distension.     Palpations: There is no mass.     Tenderness: There is no abdominal tenderness.  Skin:    Comments: Large surgical scar along the lateral left thigh.  Appears well healed.  Trace amount of drainage to the superior portion of the scar, there is serous-appearing stains on his bandage.  Compressible.  No palpable collection.   Neurological:     Mental Status: He is alert.      Imaging: DG Chest 2 View  Result Date: 09/11/2019 CLINICAL DATA:  54 year old male with chest pain and shortness of breath. EXAM: CHEST - 2 VIEW COMPARISON:  Chest radiograph dated 06/19/2019. FINDINGS: There is shallow inspiration with bibasilar atelectasis. No focal consolidation, pleural effusion, or pneumothorax. Stable cardiac silhouette. No acute osseous pathology. IMPRESSION: No acute cardiopulmonary process. Electronically Signed   By: Anner Crete M.D.   On: 09/11/2019 17:57   CT ABDOMEN PELVIS W CONTRAST  Result Date: 09/12/2019 CLINICAL DATA:  Abdominal distension, ascites EXAM: CT ABDOMEN AND PELVIS WITH CONTRAST TECHNIQUE: Multidetector CT imaging of the abdomen and pelvis was performed using the standard protocol following bolus administration of intravenous contrast. CONTRAST:  187m OMNIPAQUE IOHEXOL 300 MG/ML  SOLN COMPARISON:  CT 06/20/2019 FINDINGS: Lower chest: Small left and trace right pleural effusions with associated compressive atelectasis. Normal heart size. No pericardial effusion. Hepatobiliary: Shrunken, nodular appearance of the liver suggesting cirrhosis. No focal liver  lesion identified. Prior cholecystectomy. Pancreas: Appears unremarkable without pancreatic ductal dilatation. No definite peripancreatic inflammatory changes although fluid from adjacent ascites slightly limits the evaluation. Spleen: Splenomegaly. Spleen measures 20 cm in length. Adrenals/Urinary Tract: Unremarkable adrenal glands. 1.6 cm lower pole right renal cyst. Kidneys appear otherwise within normal limits. No renal calculi. No hydronephrosis. Nondilated ureters. Urinary bladder appears unremarkable. Stomach/Bowel: Stomach is within normal limits. Appendix not clearly visualized. Scattered colonic diverticulosis.  No evidence of bowel wall thickening, distention, or inflammatory changes. Vascular/Lymphatic: Abdominal aorta is nonaneurysmal. Recanalization of the umbilical vein. Multiple upper abdominal varices suggesting portal hypertension. Portal vein appears patent. Small upper abdominal lymph nodes including within the porta hepatis. Reproductive: Prostate is unremarkable. Other: Moderate to large volume ascites. No pneumoperitoneum. Musculoskeletal: Rim enhancing fluid and air collection within the left gluteal soft tissues at site of previously seen gluteal abscess. Approximate measurements of the collection are 6.8 x 4.1 cm trans-axially by 16.7 cm craniocaudally (series 3, image 106; series 10, image 156). Extensive surrounding subcutaneous edema overlying the left hip and proximal thigh as well as the bilateral flanks. No acute osseous abnormality. No areas of cortical destruction to suggest osteomyelitis. IMPRESSION: 1. Rim-enhancing fluid and air collection within the left gluteal soft tissues at site of previously seen gluteal abscess. Approximate measurements of the collection are 6.8 x 4.1 x 16.7 cm. No areas of underlying cortical destruction to suggest osteomyelitis. 2. Cirrhosis with stigmata of portal hypertension including splenomegaly and moderate to large volume ascites. 3. Small left and  trace right pleural effusions. These results were called by telephone at the time of interpretation on 09/12/2019 at 10:18 am to provider [facility] , who verbally acknowledged these results. Electronically Signed   By: Davina Poke D.O.   On: 09/12/2019 10:19   ECHOCARDIOGRAM COMPLETE  Result Date: 09/12/2019    ECHOCARDIOGRAM REPORT   Patient Name:   Robert Sullivan Date of Exam: 09/12/2019 Medical Rec #:  [id number]   Height:       75.0 in Accession #:    [id number]  Weight:       370.0 lb Date of Birth:  Jun 06, 1966  BSA:          2.850 m Patient Age:    84 years    BP:           144/82 mmHg Patient Gender: M           HR:           95 bpm. Exam Location:  Inpatient Procedure: 2D Echo Indications:    acute diastolic chf 867.67  History:        Patient has prior history of Echocardiogram examinations, most                 recent 06/12/2019. Cirrhosis of liver with ascites. pleural                 effusion.  Sonographer:    Johny Chess Referring Phys: [id number] DAVID TAT  Sonographer Comments: Patient is morbidly obese. Image acquisition challenging due to respiratory motion and Image acquisition challenging due to patient body habitus. IMPRESSIONS  1. Normal LV function; trace MR; ascites noted on subcostal views.  2. Left ventricular ejection fraction, by estimation, is 60 to 65%. The left ventricle has normal function. The left ventricle has no regional wall motion abnormalities. Left ventricular diastolic parameters were normal.  3. Right ventricular systolic function is normal. The right ventricular size is normal.  4. The mitral valve is normal in structure and function. No evidence of mitral valve regurgitation. No evidence of mitral stenosis.  5. The aortic valve is tricuspid. Aortic valve regurgitation is not visualized. No aortic stenosis is present.  6. The inferior vena cava is normal in size with greater than 50% respiratory variability, suggesting right atrial pressure of 3 mmHg. FINDINGS  Left  Ventricle: Left ventricular ejection fraction, by estimation, is 60 to 65%.  The left ventricle has normal function. The left ventricle has no regional wall motion abnormalities. The left ventricular internal cavity size was normal in size. There is  no left ventricular hypertrophy. Left ventricular diastolic parameters were normal. Right Ventricle: The right ventricular size is normal. Right ventricular systolic function is normal. Left Atrium: Left atrial size was normal in size. Right Atrium: Right atrial size was normal in size. Pericardium: There is no evidence of pericardial effusion. Mitral Valve: The mitral valve is normal in structure and function. Normal mobility of the mitral valve leaflets. No evidence of mitral valve regurgitation. No evidence of mitral valve stenosis. Tricuspid Valve: The tricuspid valve is normal in structure. Tricuspid valve regurgitation is trivial. No evidence of tricuspid stenosis. Aortic Valve: The aortic valve is tricuspid. Aortic valve regurgitation is not visualized. No aortic stenosis is present. Pulmonic Valve: The pulmonic valve was normal in structure. Pulmonic valve regurgitation is trivial. No evidence of pulmonic stenosis. Aorta: The aortic root is normal in size and structure. Venous: The inferior vena cava is normal in size with greater than 50% respiratory variability, suggesting right atrial pressure of 3 mmHg. IAS/Shunts: No atrial level shunt detected by color flow Doppler. Additional Comments: Normal LV function; trace MR; ascites noted on subcostal views.  LEFT VENTRICLE PLAX 2D LVIDd:         4.69 cm  Diastology LVIDs:         2.91 cm  LV e' lateral: 16.50 cm/s LV PW:         0.96 cm  LV e' medial:  10.20 cm/s LV IVS:        0.95 cm LVOT diam:     2.30 cm LV SV:         88 LV SV Index:   31 LVOT Area:     4.15 cm  LEFT ATRIUM           Index       RIGHT ATRIUM           Index LA diam:      4.60 cm 1.61 cm/m  RA Area:     12.80 cm LA Vol (A2C): 55.7 ml 19.54  ml/m RA Volume:   24.00 ml  8.42 ml/m  AORTIC VALVE LVOT Vmax:   110.00 cm/s LVOT Vmean:  69.800 cm/s LVOT VTI:    0.211 m  AORTA Ao Root diam: 3.30 cm  SHUNTS Systemic VTI:  0.21 m Systemic Diam: 2.30 cm Kirk Ruths MD Electronically signed by Kirk Ruths MD Signature Date/Time: 09/12/2019/4:38:39 PM    Final     Labs:  CBC: Recent Labs    07/21/19 0855 07/21/19 0855 07/21/19 1700 07/24/19 0807 09/11/19 1702 09/12/19 0744  WBC 7.3  --   --  4.5 5.7 3.9*  HGB 7.0*   < > 7.4* 7.1* 10.2* 10.5*  HCT 21.8*   < > 23.5* 23.1* 33.7* 34.8*  PLT 109*  --   --  99* 143* 125*   < > = values in this interval not displayed.    COAGS: Recent Labs    06/10/19 1345 06/16/19 1656 06/22/19 0356  INR 1.4* 1.4* 1.3*  APTT 30  --   --     BMP: Recent Labs    07/15/19 0336 07/15/19 0336 07/16/19 1352 07/17/19 0359 09/11/19 1702 09/12/19 0744  NA 132*   < > 136 135 140 141  K 4.0   < > 4.5 4.4 4.0 3.5  CL 107   < >  107 109 109 108  CO2 20*  --   --  21* 23 24  GLUCOSE 150*   < > 128* 140* 125* 105*  BUN 19   < > _0 CALCIUM 7.5*  --   --  7.7* 8.6* 8.6*  CREATININE 0.64   < > 0.60* 0.84 0.77 0.72  GFRNONAA >60  --   --  >60 >60 >60  GFRAA >60  --   --  >60 >60 >60   < > = values in this interval not displayed.    LIVER FUNCTION TESTS: Recent Labs    06/18/19 0211 06/18/19 0211 06/28/19 0412 06/29/19 0350 07/10/19 0036 09/12/19 0744  BILITOT 1.3*  --  0.5 1.1  --  1.1  AST 82*  --  96* 105*  --  61*  ALT 27  --  28 33  --  21  ALKPHOS 300*  --  220* 260*  --  126  PROT 6.6  --  5.1* 6.3*  --  6.7  ALBUMIN 1.3*   < > 1.3* 1.7* 1.9* 2.3*   < > = values in this interval not displayed.    TUMOR MARKERS: No results for input(s): AFPTM, CEA, CA199, CHROMGRNA in the last 8760 hours.  Assessment and Plan: Left gluteal abscess Patient with history of left gluteal/thigh hematoma which became infected.  Over the past 3 months he has required multiple  interventions, debridements, and IV antibiotics for various species of bacteria.  He has been able to achieve good blood glucose control, however continues to show signs of slow wound healing.  He was admitted to the hospital today after evaluation with GI due to increased abdominal, scrotal, and bilateral leg swelling.  The swelling and dyspnea on exertion is his current main complaint.  Afebrile His WBC is 3.9.  Unfortunately his left gluteal abscess is incompletely visualized on CT today.  Available imaging reviewed by Dr. Pascal Lux who notes some improvement in the collection.  The patient denies significant drainage from his incision/scar, but does have some serous-appearing drainage on his dressing from home.   IR has been consulted for paracentesis as well as possible aspiration.  Dr. Pascal Lux recommends consideration of MRI of left hip and femur in conjunction with consultation with Ortho and ID.   Plan for paracentesis 3/6.   IR following.   Thank you for this interesting consult.  I greatly enjoyed meeting Erez Mccallum and look forward to participating in their care.  A copy of this report was sent to the requesting provider on this date.  Electronically Signed: Docia Barrier, PA 09/12/2019, 8:10 PM   I spent a total of 40 Minutes    in face to face in clinical consultation, greater than 50% of which was counseling/coordinating care for left gluteal abscess.

## 2019-09-12 NOTE — Telephone Encounter (Signed)
Pt is currently in the ER.

## 2019-09-12 NOTE — Plan of Care (Signed)
  Problem: Education: Goal: Knowledge of General Education information will improve Description: Including pain rating scale, medication(s)/side effects and non-pharmacologic comfort measures Outcome: Progressing   Problem: Health Behavior/Discharge Planning: Goal: Ability to manage health-related needs will improve Outcome: Progressing   Problem: Clinical Measurements: Goal: Will remain free from infection Outcome: Progressing Goal: Respiratory complications will improve Outcome: Progressing Goal: Cardiovascular complication will be avoided Outcome: Progressing   Problem: Pain Managment: Goal: General experience of comfort will improve Outcome: Progressing   Problem: Safety: Goal: Ability to remain free from injury will improve Outcome: Progressing   Problem: Skin Integrity: Goal: Risk for impaired skin integrity will decrease Outcome: Progressing

## 2019-09-12 NOTE — ED Provider Notes (Signed)
[facility] Provider Note   CSN: [id number] Arrival date & time: 09/11/19  1635     History Chief Complaint  Patient presents with   Shortness of Breath    Leslie Langille is a 54 y.o. male history of obesity, diabetes, DVT, hypertension, hyperlipidemia, sleep apnea, MSSA bacteremia.  Patient presents today for worsening shortness of breath and swelling x2 months.  He had extended hospital stay late 2020 secondary to abscess of the left hip treated with multiple surgical procedures.  He has been on and off antibiotics since that time.  He reports that towards the end of his hospital stay in January he began developing swelling of the bilateral lower extremities which has been gradually worsening since that time.  He also reports that around the same time he had begun to develop shortness of breath worsened with exertion and feeling of difficulty catching his breath which is improved with rest.  He reports that symptoms have all been progressively worsening for the last 2 months and he feels the swelling now involves his bilateral lower extremities and his lower abdomen as well.  He reports he was seen by gastroenterologist yesterday afternoon who thought that he had fluid on his lungs and symptoms to the ER for further evaluation.  Associated symptoms mild intermittent chest pain and a brief sharp sensation in the center of his chest no clear alleviating factors, lasts only a few seconds worsened with shortness of breath, nonradiating.  Denies fever/chills, headache, neck pain, cough, hemoptysis, vomiting/diarrhea, dysuria/hematuria, numbness/tingling, weakness or any additional concerns. HPI     Past Medical History:  Diagnosis Date   Anemia    Anxiety    Arthritis 05/13/2019   knees   Depression    Diabetes mellitus without complication (HCC)    DVT (deep venous thrombosis) (HCC)    Gallstones    Headache    HTN (hypertension)     Hyperlipidemia    Sleep apnea     Patient Active Problem List   Diagnosis Date Noted   Blister of hip with infection 07/09/2019   Abscess of left hip    MSSA bacteremia 06/19/2019   Uncontrolled diabetes mellitus ([place]) 06/10/2019   Obesity, Class III, BMI 40-49.9 (morbid obesity) ([place]) 06/10/2019   Cellulitis, gluteal, left 06/10/2019   Sepsis secondary to UTI ([place]) 05/13/2019   Acute lower UTI 05/13/2019   Type 2 diabetes mellitus without complication ([place]) 94/70/9628   Hip pain, acute, left 05/13/2019   Sepsis ([place]) 05/13/2019   Acute cystitis without hematuria    Hyperglycemia     Past Surgical History:  Procedure Laterality Date   I & D EXTREMITY Left 07/16/2019   Procedure: LEFT HIP DEBRIDEMENT;  Surgeon: Newt Minion, MD;  Location: [facility];  Service: Orthopedics;  Laterality: Left;   I & D EXTREMITY Left 07/18/2019   Procedure: REPEAT IRRIGATION AND DEBRIDEMENT LEFT HIP;  Surgeon: Newt Minion, MD;  Location: [facility];  Service: Orthopedics;  Laterality: Left;   INCISION AND DRAINAGE ABSCESS Left 06/23/2019   Procedure: INCISION AND DRAINAGE GLUTEAL ABSCESS;  Surgeon: Renette Butters, MD;  Location: [facility];  Service: Orthopedics;  Laterality: Left;   INCISION AND DRAINAGE ABSCESS Left 07/09/2019   Procedure: INCISION AND DRAINAGE LEFT HIP/PELVIC ABSCESS, APPLICATION OF NEGATIVE PRESSURE WOUND VAC;  Surgeon: Newt Minion, MD;  Location: [facility];  Service: Orthopedics;  Laterality: Left;   INCISION AND DRAINAGE HIP Left 06/25/2019   Procedure: IRRIGATION AND  DEBRIDEMENT HIP;  Surgeon: Shona Needles, MD;  Location: [facility];  Service: Orthopedics;  Laterality: Left;   INCISION AND DRAINAGE OF WOUND Left 07/12/2019   Procedure: LEFT HIP DEBRIDEMENT;  Surgeon: Newt Minion, MD;  Location: [facility];  Service: Orthopedics;  Laterality: Left;   IR US GUIDE BX ASP/DRAIN  06/17/2019   KNEE ARTHROSCOPY Bilateral    left x 2, 1 right   LUMBAR DISC SURGERY     NASAL  SINUS SURGERY     TEE WITHOUT CARDIOVERSION N/A 06/16/2019   Procedure: TRANSESOPHAGEAL ECHOCARDIOGRAM (TEE);  Surgeon: Dorothy Spark, MD;  Location[facility] END[facility]COPY;  Service: Cardiovascular;  Laterality: N/A;       Family History  Problem Relation Age of Onset   Hypertension Father     Social History   Tobacco Use   Smoking status: Former Smoker    Types: Cigars    Quit date: 09/10/2017    Years since quitting: 2.0   Smokeless tobacco: Never Used  Substance Use Topics   Alcohol use: Not Currently   Drug use: Not Currently    Home Medications Prior to Admission medications   Medication Sig Start Date End Date Taking? Authorizing Provider  aspirin 325 MG tablet Take 1 tablet (325 mg total) by mouth daily. Patient taking differently: Take 325 mg by mouth every 6 (six) hours as needed for mild pain.  07/25/19  Yes Persons, Bevely Palmer, PA  insulin aspart (NOVOLOG) 100 UNIT/ML injection Inject 4 Units into the skin in the morning and at bedtime.    Yes [provider]  insulin glargine (LANTUS) 100 UNIT/ML injection Inject 0.24 mLs (24 Units total) into the skin daily. Patient taking differently: Inject 50 Units into the skin daily.  07/01/19  Yes Hongalgi, Lenis Dickinson, MD  blood glucose meter kit and supplies KIT Dispense based on patient and insurance preference. Use up to four times daily as directed. (FOR ICD-9 250.00, 250.01). 06/30/19   Hongalgi, Lenis Dickinson, MD  Ensure Max Protein (ENSURE MAX PROTEIN) LIQD Take 330 mLs (11 oz total) by mouth daily. Patient not taking: Reported on 09/12/2019 07/01/19   Modena Jansky, MD  Insulin Syringes, Disposable, U-100 0.5 ML MISC Use as directed 3 times daily AC. 06/30/19   Hongalgi, Lenis Dickinson, MD  Multiple Vitamin (MULTIVITAMIN WITH MINERALS) TABS tablet Take 1 tablet by mouth daily. Patient not taking: Reported on 09/12/2019 07/01/19   Modena Jansky, MD    Allergies    Sulfa antibiotics  Review of Systems   Review of  Systems Ten systems are reviewed and are negative for acute change except as noted in the HPI  Physical Exam Updated Vital Signs BP (!) 142/89    Pulse 100    Temp 97.8 F (36.6 C) (Oral)    Resp 18    SpO2 95%   Physical Exam Constitutional:      General: He is not in acute distress.    Appearance: Normal appearance. He is well-developed. He is obese. He is not ill-appearing or diaphoretic.  HENT:     Head: Normocephalic and atraumatic.     Right Ear: External ear normal.     Left Ear: External ear normal.     Nose: Nose normal.  Eyes:     General: Vision grossly intact. Gaze aligned appropriately.     Pupils: Pupils are equal, round, and reactive to light.  Neck:     Trachea: Trachea and phonation normal. No tracheal deviation.  Cardiovascular:     Rate and Rhythm: Normal rate and regular rhythm.  Pulmonary:     Effort: Pulmonary effort is normal. No tachypnea, accessory muscle usage or respiratory distress.     Breath sounds: Wheezing present.  Abdominal:     General: There is no distension.     Palpations: Abdomen is soft.     Tenderness: There is no abdominal tenderness. There is no guarding or rebound.  Musculoskeletal:        General: Normal range of motion.     Cervical back: Normal range of motion.     Right lower leg: No tenderness. Edema present.     Left lower leg: No tenderness. Edema present.  Skin:    General: Skin is warm and dry.          Comments: Large surgical incision scant amount of serosanguineous drainage at the superior border.  Nontender no fluctuance minimal erythema no streaking.  Neurological:     Mental Status: He is alert.     GCS: GCS eye subscore is 4. GCS verbal subscore is 5. GCS motor subscore is 6.     Comments: Speech is clear and goal oriented, follows commands Major Cranial nerves without deficit, no facial droop Moves extremities without ataxia, coordination intact  Psychiatric:        Behavior: Behavior normal.     ED  Results / Procedures / Treatments   Labs (all labs ordered are listed, but only abnormal results are displayed) Labs Reviewed  BASIC METABOLIC PANEL - Abnormal; Notable for the following components:      Result Value   Glucose, Bld 125 (*)    Calcium 8.6 (*)    All other components within normal limits  CBC - Abnormal; Notable for the following components:   RBC 4.02 (*)    Hemoglobin 10.2 (*)    HCT 33.7 (*)    MCH 25.4 (*)    RDW 16.2 (*)    Platelets 143 (*)    All other components within normal limits  CBC WITH DIFFERENTIAL/PLATELET - Abnormal; Notable for the following components:   WBC 3.9 (*)    RBC 4.17 (*)    Hemoglobin 10.5 (*)    HCT 34.8 (*)    MCH 25.2 (*)    RDW 16.3 (*)    Platelets 125 (*)    All other components within normal limits  COMPREHENSIVE METABOLIC PANEL - Abnormal; Notable for the following components:   Glucose, Bld 105 (*)    Calcium 8.6 (*)    Albumin 2.3 (*)    AST 61 (*)    All other components within normal limits  URINALYSIS, ROUTINE W REFLEX MICROSCOPIC - Abnormal; Notable for the following components:   Color, Urine AMBER (*)    APPearance HAZY (*)    Protein, ur 30 (*)    Bacteria, UA RARE (*)    All other components within normal limits  CULTURE, BLOOD (SINGLE)  SARS CORONAVIRUS 2 (TAT 6-24 HRS)  BRAIN NATRIURETIC PEPTIDE  LIPASE, BLOOD  SEDIMENTATION RATE  C-REACTIVE PROTEIN  TROPONIN I (HIGH SENSITIVITY)  TROPONIN I (HIGH SENSITIVITY)  TROPONIN I (HIGH SENSITIVITY)    EKG EKG Interpretation  Date/Time:  Thursday September 11 2019 16:40:55 EST Ventricular Rate:  111 PR Interval:    QRS Duration: 74 QT Interval:  318 QTC Calculation: 432 R Axis:   16 Text Interpretation: Accelerated Junctional rhythm Cannot rule out Inferior infarct , age undetermined Cannot rule out Anterior infarct ,  age undetermined Abnormal ECG Rate faster Confirmed by Ezequiel Essex [phone]) on 09/12/2019 6:19:12 AM Also confirmed by Ezequiel Essex  [phone]), editor Hattie Perch [phone])  on 09/12/2019 8:47:32 AM   Radiology DG Chest 2 View  Result Date: 09/11/2019 CLINICAL DATA:  54 year old male with chest pain and shortness of breath. EXAM: CHEST - 2 VIEW COMPARISON:  Chest radiograph dated 06/19/2019. FINDINGS: There is shallow inspiration with bibasilar atelectasis. No focal consolidation, pleural effusion, or pneumothorax. Stable cardiac silhouette. No acute osseous pathology. IMPRESSION: No acute cardiopulmonary process. Electronically Signed   By: Anner Crete M.D.   On: 09/11/2019 17:57   CT ABDOMEN PELVIS W CONTRAST  Result Date: 09/12/2019 CLINICAL DATA:  Abdominal distension, ascites EXAM: CT ABDOMEN AND PELVIS WITH CONTRAST TECHNIQUE: Multidetector CT imaging of the abdomen and pelvis was performed using the standard protocol following bolus administration of intravenous contrast. CONTRAST:  111m OMNIPAQUE IOHEXOL 300 MG/ML  SOLN COMPARISON:  CT 06/20/2019 FINDINGS: Lower chest: Small left and trace right pleural effusions with associated compressive atelectasis. Normal heart size. No pericardial effusion. Hepatobiliary: Shrunken, nodular appearance of the liver suggesting cirrhosis. No focal liver lesion identified. Prior cholecystectomy. Pancreas: Appears unremarkable without pancreatic ductal dilatation. No definite peripancreatic inflammatory changes although fluid from adjacent ascites slightly limits the evaluation. Spleen: Splenomegaly. Spleen measures 20 cm in length. Adrenals/Urinary Tract: Unremarkable adrenal glands. 1.6 cm lower pole right renal cyst. Kidneys appear otherwise within normal limits. No renal calculi. No hydronephrosis. Nondilated ureters. Urinary bladder appears unremarkable. Stomach/Bowel: Stomach is within normal limits. Appendix not clearly visualized. Scattered colonic diverticulosis. No evidence of bowel wall thickening, distention, or inflammatory changes. Vascular/Lymphatic: Abdominal aorta is  nonaneurysmal. Recanalization of the umbilical vein. Multiple upper abdominal varices suggesting portal hypertension. Portal vein appears patent. Small upper abdominal lymph nodes including within the porta hepatis. Reproductive: Prostate is unremarkable. Other: Moderate to large volume ascites. No pneumoperitoneum. Musculoskeletal: Rim enhancing fluid and air collection within the left gluteal soft tissues at site of previously seen gluteal abscess. Approximate measurements of the collection are 6.8 x 4.1 cm trans-axially by 16.7 cm craniocaudally (series 3, image 106; series 10, image 156). Extensive surrounding subcutaneous edema overlying the left hip and proximal thigh as well as the bilateral flanks. No acute osseous abnormality. No areas of cortical destruction to suggest osteomyelitis. IMPRESSION: 1. Rim-enhancing fluid and air collection within the left gluteal soft tissues at site of previously seen gluteal abscess. Approximate measurements of the collection are 6.8 x 4.1 x 16.7 cm. No areas of underlying cortical destruction to suggest osteomyelitis. 2. Cirrhosis with stigmata of portal hypertension including splenomegaly and moderate to large volume ascites. 3. Small left and trace right pleural effusions. These results were called by telephone at the time of interpretation on 09/12/2019 at 10:18 am to [facility], who verbally acknowledged these results. Electronically Signed   By: NDavina PokeD.O.   On: 09/12/2019 10:19    Procedures Procedures (including critical care time)  Medications Ordered in ED Medications  sodium chloride flush (NS) 0.9 % injection 3 mL (has no administration in time range)  vancomycin (VANCOREADY) IVPB 2000 mg/400 mL (2,000 mg Intravenous New Bag/Given 09/12/19 1057)  vancomycin (VANCOREADY) IVPB 1500 mg/300 mL (has no administration in time range)  albuterol (VENTOLIN HFA) 108 (90 Base) MCG/ACT inhaler 1-2 puff (2 puffs Inhalation Given 09/12/19 0725)    AeroChamber Plus Flo-Vu Large MISC 1 each (1 each Other Given 09/12/19 0727)  iohexol (OMNIPAQUE) 300 MG/ML solution 100 mL (  100 mLs Intravenous Contrast Given 09/12/19 0943)    ED Course  I have reviewed the triage vital signs and the nursing notes.  Pertinent labs & imaging results that were available during my care of the patient were reviewed by me and considered in my medical decision making (see chart for details).  Clinical Course as of Sep 11 1232  Fri Sep 12, 2019  0849 Ellouise Newer, PA-C:   [BM]  [facility]   [BM]  5400 Dr. Louanne Skye; Ortho   [BM]  51 Dr. Carles Collet   [BM]    Clinical Course User Index [BM] Gari Crown   MDM Rules/Calculators/A&P                     54 year old male with history as detailed above presents today for 2 months of shortness of breath and swelling of his bilateral lower extremities and abdomen.  No recent infectious type symptoms.  Reports an intermittent brief chest pain along with his shortness of breath, not currently short of breath or having chest pain on initial evaluation.  He is in no acute distress on initial evaluation cranial nerves intact, no meningeal signs, heart regular rate and rhythm, there is diffuse wheezing in all lung fields no obvious rales, abdomen is nontender and without peritoneal signs, is neurovascular intact to all 4 extremities, there is 1+ bilateral pitting edema which appears symmetric.  Unfortunate patient has been in the waiting room for around 14 hours prior to my initial evaluation.  He had a CBC performed last night which showed hemoglobin 10.2 which is improved from priors, no leukocytosis to suggest infection.  BMP shows elevation of glucose, calcium 8.6 no emergent electrolyte derangement or kidney injury.  High since he troponins were both within normal limits however initially it was 5 and delta was 14.  I attempted to review gastroenterologist note from yesterday however it is  incomplete and there is no information regarding the referral to the ER or their work-up yesterday.  Since patient has been in the ER for greater than 14 hours will repeat CBC with differential, CMP added for evaluation of LFTs, another troponin has been added since the change between the initial and the second was 9.  I have added a BNP, lipase and urinalysis to his work-up.  Chest x-ray personally reviewed I agree with radiologist interpretation of shows bibasilar atelectasis, no acute cardiopulmonary process.  He does have 1+ bilateral lower extremity edema on exam however lung sounds suggest reactive airway disease as he is wheezing in all fields.  I have ordered patient albuterol inhaler to help with his shortness of breath.  Will monitor and reassess. - 8:49 AM: I spoke with Ellouise Newer, PA-C at the [facility], she reports they are concerned for possible new onset cirrhosis and ascites.  They requested CT abdomen pelvis for further evaluation.  She advises that if significant cirrhosis is seen that they would like patient to be admitted to hospitalist service for them to see patient in consultation. - CBC shows mild leukopenia from prior, hemoglobin stable.  CMP without emergent electrolyte derangement, kidney injury or elevation of LFTs.  BNP within normal limits.  Third troponin is 5, flat no evidence of cardiac damage at this time.  CT abdomen pelvis:  IMPRESSION:  1. Rim-enhancing fluid and air collection within the left gluteal  soft tissues at site of previously seen gluteal abscess. Approximate  measurements of the collection are 6.8 x  4.1 x 16.7 cm. No areas of  underlying cortical destruction to suggest osteomyelitis.  2. Cirrhosis with stigmata of portal hypertension including  splenomegaly and moderate to large volume ascites.  3. Small left and trace right pleural effusions.   Patient reevaluated updated on findings as above and states understanding.  He is agreeable for  admission at this time.  Evaluation of patient's surgical site of the left hip is remarkable for a small amount of serosanguineous drainage from the superior border without significant erythema or palpable abscess. Examination is limited by body habitus.  Blood culture and empiric vancomycin ordered.  Wheezing improved with albuterol.  Patient reports shortness of breath improved.  Covid test has been ordered, lower suspicion for COVID-19 viral infection based on lack of infectious symptoms at this time.  Consult was called to [facility] gastroenterology I spoke with Maylon Peppers who agrees with hospitalist admission and GI to round on patient after admission. - Consulted orthopedic specialist Dr. Louanne Skye who is asked for addition of ESR and CRP, will have orthopedic team around on patient in the hospital after admission.  Dr. Louanne Skye advises he will be contacting interventional radiology for possible aspiration of the fluid collection. - 12:31 PM: Consult called to hospitalist Dr. Carles Collet who is accepted patient to his service. - Patient reassessed resting comfortably no acute distress states understanding of care plan he has no questions or additional concerns at this time.  Case was discussed with Dr. Alvino Chapel during this visit who agrees with plan.  Taariq Leitz was evaluated in Emergency Department on 09/12/2019 for the symptoms described in the history of present illness. He was evaluated in the context of the global COVID-19 pandemic, which necessitated consideration that the patient might be at risk for infection with the SARS-CoV-2 virus that causes COVID-19. Institutional protocols and algorithms that pertain to the evaluation of patients at risk for COVID-19 are in a state of rapid change based on information released by regulatory bodies including the CDC and federal and state organizations. These policies and algorithms were followed during the patient's care in the ED.  Note: Portions of this report  may have been transcribed using voice recognition software. Every effort was made to ensure accuracy; however, inadvertent computerized transcription errors may still be present. Final Clinical Impression(s) / ED Diagnoses Final diagnoses:  Cirrhosis of liver with ascites, unspecified hepatic cirrhosis type ([place])  Abscess, gluteal, left  Pleural effusion    Rx / DC Orders ED Discharge Orders    None       Gari Crown 09/12/19 1235    Davonna Belling, MD 09/12/19 1517

## 2019-09-12 NOTE — Progress Notes (Signed)
  Echocardiogram 2D Echocardiogram has been performed.  Robert Sullivan 09/12/2019, 3:52 PM

## 2019-09-12 NOTE — H&P (Addendum)
History and Physical  Tushar Enns EVO:[id number] DOB: 03-14-1966 DOA: 09/11/2019   PCP: Cathleen Corti, PA-C   Patient coming from: Home  Chief Complaint: increase swelling. edema  HPI:  Robert Sullivan is a 54 y.o. male with medical history of diabetes mellitus, left gluteal abscess, presenting with 1 to 77-monthhistory of increasing lower extremity edema and dyspnea on exertion.  Patient has had fairly complicated history with numerous recent hospital admissions.  Most notably, the patient was admitted to the hospital from 07/09/2019 to 07/25/2019 during which time the Patient underwent 2 separate I&D procedures.  Cultures on 07/16/2019 grew Enterococcus faecalis sensitive to amp and Finegoldia magna.  The patient was seen by infectious disease.  Per Dr. CHale Bogusnote on 07/15/2019, the patient was to finish 4 more weeks of cefepime.  At that point, the patient had had received 35 days of antibiotics.  The patient states that he has not been on any antibiotics since that period of time.  He has not had any fevers, chills, purulent drainage, or worsening lower extremity erythema. In addition, the patient also had a prolonged admission from 06/10/2019 to 06/30/2019 during which time he also underwent debridements for his left hip infection.  He also had MSSA bacteremia.  06/17/2019 gluteal cultures grew MSSA.  07/09/2019 left gluteal cultures grew Enterobacter cloacae.  The patient states that his left hip wound has actually been improving.  He has only had serosanguineous drainage since his 07/25/2019 discharge.  The patient was seen by [facility] in the office on 09/11/2019.  Because of his anasarca, there was concern of possible fluid overload.  As result, he was instructed to go to emergency department for further evaluation. The patient himself has been complaining of worsening lower extremity edema and dyspnea on exertion over the past 2 months.  He has had some intermittent chest discomfort that is  sharp in nature lasting only seconds.  He denies any hemoptysis, nausea, vomiting, diarrhea, hematochezia, melena. In the emergency department, the patient was afebrile hemodynamically stable with oxygen saturation 100% on room air.  BMP was unremarkable peer WBC 3.9, hemoglobin 10.5, platelets 125,000.  AST 61, ALT 21, alk phos 126, total bilirubin 1.1.  Lipase 20.  CT of the abdomen pelvis showed a small left pleural effusion, trace right pleural effusion with nodular liver.  There is a moderate to large volume ascites.  It also showed a rim-enhancing 6.8 x 4.1 x 16.7 cm left gluteal fluid collection.  He was given one dose vancomycin in ED prior to my assessment.   Assessment/Plan: Decompensated liver cirrhosis with anasarca -Start IV furosemide -will ultimately need spironolactone to be started -Urine protein creatinine ratio -Echocardiogram -Request paracentesis -Previous HCV antibodies negative -Likely secondary to NASH liver cirrhosis  Left gluteal abscess -This appears to be recurrent -09/12/19 CT abdomen pelvis--rim-enhancing 6.8 x 4.1 x 16.7 cm left gluteal fluid collection -I will hold off on additional antibiotics at this time to maximize cultures as the patient is afebrile and hemodynamically stable -Consult IR for percutaneous drainage -ID consult--Dr. SBaxter Flatterynotified -ESR--15 -CRP--3.4 -[facility] has been consulted--Dr. NLouanne Skye Bilateral pleural effusions -Stable on room air -Secondary to hepatothorax from the patient's liver cirrhosis  Thrombocytopenia -Secondary to the patient's liver cirrhosis  Uncontrolled diabetes mellitus type 2 with hyperglycemia -05/17/2019 hemoglobin A1c 11.0 -07/10/2019 hemoglobin A1c 5.5 -Reduced dose Lantus--pt tells RN he takes Lantus 15 units daily-->start 5 units at hs -NovoLog sliding scale  Lower extremity edema -Venous  duplex rule out DVT  Morbid Obesity -BMI 48.5  -lifestyle modification  Atypical chest pain -Troponins  unremarkable (14>>>5) -Personally reviewed EKG--sinus rhythm, nonspecific T wave change -Personally reviewed chest x-ray--poor insp effort.  No edema or consolidation         Past Medical History:  Diagnosis Date  . Anemia   . Anxiety   . Arthritis 05/13/2019   knees  . Depression   . Diabetes mellitus without complication ([place])   . DVT (deep venous thrombosis) ([place])   . Gallstones   . Headache   . HTN (hypertension)   . Hyperlipidemia   . Sleep apnea    Past Surgical History:  Procedure Laterality Date  . I & D EXTREMITY Left 07/16/2019   Procedure: LEFT HIP DEBRIDEMENT;  Surgeon: Newt Minion, MD;  Location: [place];  Service: Orthopedics;  Laterality: Left;  . I & D EXTREMITY Left 07/18/2019   Procedure: REPEAT IRRIGATION AND DEBRIDEMENT LEFT HIP;  Surgeon: Newt Minion, MD;  Location: [place];  Service: Orthopedics;  Laterality: Left;  . INCISION AND DRAINAGE ABSCESS Left 06/23/2019   Procedure: INCISION AND DRAINAGE GLUTEAL ABSCESS;  Surgeon: Renette Butters, MD;  Location: [place];  Service: Orthopedics;  Laterality: Left;  . INCISION AND DRAINAGE ABSCESS Left 07/09/2019   Procedure: INCISION AND DRAINAGE LEFT HIP/PELVIC ABSCESS, APPLICATION OF NEGATIVE PRESSURE WOUND VAC;  Surgeon: Newt Minion, MD;  Location: [place];  Service: Orthopedics;  Laterality: Left;  . INCISION AND DRAINAGE HIP Left 06/25/2019   Procedure: IRRIGATION AND DEBRIDEMENT HIP;  Surgeon: Shona Needles, MD;  Location: [place];  Service: Orthopedics;  Laterality: Left;  . INCISION AND DRAINAGE OF WOUND Left 07/12/2019   Procedure: LEFT HIP DEBRIDEMENT;  Surgeon: Newt Minion, MD;  Location: [place];  Service: Orthopedics;  Laterality: Left;  . IR US GUIDE BX ASP/DRAIN  06/17/2019  . KNEE ARTHROSCOPY Bilateral    left x 2, 1 right  . LUMBAR DISC SURGERY    . NASAL SINUS SURGERY    . TEE WITHOUT CARDIOVERSION N/A 06/16/2019   Procedure: TRANSESOPHAGEAL ECHOCARDIOGRAM (TEE);  Surgeon: Dorothy Spark,  MD;  Location: [facility];  Service: Cardiovascular;  Laterality: N/A;   Social History:  reports that he quit smoking about 2 years ago. His smoking use included cigars. He has never used smokeless tobacco. He reports previous alcohol use. He reports previous drug use.   Family History  Problem Relation Age of Onset  . Hypertension Father      Allergies  Allergen Reactions  . Sulfa Antibiotics Rash    Total body rash     Prior to Admission medications   Medication Sig Start Date End Date Taking? Authorizing Provider  aspirin 325 MG tablet Take 1 tablet (325 mg total) by mouth daily. Patient taking differently: Take 325 mg by mouth every 6 (six) hours as needed for mild pain.  07/25/19  Yes Persons, Bevely Palmer, PA  insulin aspart (NOVOLOG) 100 UNIT/ML injection Inject 4 Units into the skin in the morning and at bedtime.    Yes [provider]  insulin glargine (LANTUS) 100 UNIT/ML injection Inject 0.24 mLs (24 Units total) into the skin daily. Patient taking differently: Inject 50 Units into the skin daily.  07/01/19  Yes Hongalgi, Lenis Dickinson, MD  blood glucose meter kit and supplies KIT Dispense based on patient and insurance preference. Use up to four times daily as directed. (FOR ICD-9 250.00, 250.01). 06/30/19   Hongalgi,  Lenis Dickinson, MD  Ensure Max Protein (ENSURE MAX PROTEIN) LIQD Take 330 mLs (11 oz total) by mouth daily. Patient not taking: Reported on 09/12/2019 07/01/19   Modena Jansky, MD  Insulin Syringes, Disposable, U-100 0.5 ML MISC Use as directed 3 times daily AC. 06/30/19   Hongalgi, Lenis Dickinson, MD  Multiple Vitamin (MULTIVITAMIN WITH MINERALS) TABS tablet Take 1 tablet by mouth daily. Patient not taking: Reported on 09/12/2019 07/01/19   Modena Jansky, MD    Review of Systems:  Constitutional:  No weight loss, night sweats,  Head&Eyes: No headache.  No vision loss.  No eye pain or scotoma ENT:  No Difficulty swallowing,Tooth/dental problems,Sore throat,    No ear ache, post nasal drip,  Cardio-vascular:  No  Orthopnea, PND, swelling in lower extremities,  dizziness, palpitations  GI:  No  abdominal pain, nausea, vomiting, diarrhea, loss of appetite, hematochezia, melena, heartburn, indigestion, Resp:   No coughing up of blood .No wheezing.No chest wall deformity  Skin:  no rash or lesions.  GU:  no dysuria, change in color of urine, no urgency or frequency. No flank pain.  Musculoskeletal:   No decreased range of motion. No back pain.  Psych:  No change in mood or affect. No depression or anxiety. Neurologic: No headache, no dysesthesia, no focal weakness, no vision loss. No syncope  Physical Exam: Vitals:   09/12/19 0900 09/12/19 1030 09/12/19 1040 09/12/19 1100  BP: 125/71  133/89 (!) 142/89  Pulse: 95 95 100 100  Resp: 16 19 (!) 21 18  Temp:      TempSrc:      SpO2: 98% 97% 98% 95%   General:  A&O x 3, NAD, nontoxic, pleasant/cooperative Head/Eye: No conjunctival hemorrhage, no icterus, [place]/AT, No nystagmus ENT:  No icterus,  No thrush, good dentition, no pharyngeal exudate Neck:  No masses, no lymphadenpathy, no bruits CV:  RRR, no rub, no gallop, no S3 Lung:  Bibasilar crackles. No wheeze Abdomen: soft/NT, +BS, nondistended, no peritoneal signs Ext: No cyanosis, No rashes, No petechiae, No lymphangitis, 2 +LE edema Neuro: CNII-XII intact, strength 4/5 in bilateral upper and lower extremities, no dysmetria  Labs on Admission:  Basic Metabolic Panel: Recent Labs  Lab 09/11/19 1702 09/12/19 0744  NA 140 141  K 4.0 3.5  CL 109 108  CO2 23 24  GLUCOSE 125* 105*  BUN 14 15  CREATININE 0.77 0.72  CALCIUM 8.6* 8.6*   Liver Function Tests: Recent Labs  Lab 09/12/19 0744  AST 61*  ALT 21  ALKPHOS 126  BILITOT 1.1  PROT 6.7  ALBUMIN 2.3*   Recent Labs  Lab 09/12/19 0744  LIPASE 20   No results for input(s): AMMONIA in the last 168 hours. CBC: Recent Labs  Lab 09/11/19 1702 09/12/19 0744  WBC 5.7  3.9*  NEUTROABS  --  2.3  HGB 10.2* 10.5*  HCT 33.7* 34.8*  MCV 83.8 83.5  PLT 143* 125*   Coagulation Profile: No results for input(s): INR, PROTIME in the last 168 hours. Cardiac Enzymes: No results for input(s): CKTOTAL, CKMB, CKMBINDEX, TROPONINI in the last 168 hours. BNP: Invalid input(s): POCBNP CBG: No results for input(s): GLUCAP in the last 168 hours. Urine analysis:    Component Value Date/Time   COLORURINE AMBER (A) 09/12/2019 1057   APPEARANCEUR HAZY (A) 09/12/2019 1057   LABSPEC 1.030 09/12/2019 1057   PHURINE 5.0 09/12/2019 1057   GLUCOSEU NEGATIVE 09/12/2019 1057   HGBUR NEGATIVE 09/12/2019 1057   BILIRUBINUR  NEGATIVE 09/12/2019 1057   KETONESUR NEGATIVE 09/12/2019 1057   PROTEINUR 30 (A) 09/12/2019 1057   NITRITE NEGATIVE 09/12/2019 1057   LEUKOCYTESUR NEGATIVE 09/12/2019 1057   Sepsis Labs: @LABRCNTIP (procalcitonin:4,lacticidven:4) )No results found for this or any previous visit (from the past 240 hour(s)).   Radiological Exams on Admission: DG Chest 2 View  Result Date: 09/11/2019 CLINICAL DATA:  54 year old male with chest pain and shortness of breath. EXAM: CHEST - 2 VIEW COMPARISON:  Chest radiograph dated 06/19/2019. FINDINGS: There is shallow inspiration with bibasilar atelectasis. No focal consolidation, pleural effusion, or pneumothorax. Stable cardiac silhouette. No acute osseous pathology. IMPRESSION: No acute cardiopulmonary process. Electronically Signed   By: Anner Crete M.D.   On: 09/11/2019 17:57   CT ABDOMEN PELVIS W CONTRAST  Result Date: 09/12/2019 CLINICAL DATA:  Abdominal distension, ascites EXAM: CT ABDOMEN AND PELVIS WITH CONTRAST TECHNIQUE: Multidetector CT imaging of the abdomen and pelvis was performed using the standard protocol following bolus administration of intravenous contrast. CONTRAST:  159m OMNIPAQUE IOHEXOL 300 MG/ML  SOLN COMPARISON:  CT 06/20/2019 FINDINGS: Lower chest: Small left and trace right pleural effusions  with associated compressive atelectasis. Normal heart size. No pericardial effusion. Hepatobiliary: Shrunken, nodular appearance of the liver suggesting cirrhosis. No focal liver lesion identified. Prior cholecystectomy. Pancreas: Appears unremarkable without pancreatic ductal dilatation. No definite peripancreatic inflammatory changes although fluid from adjacent ascites slightly limits the evaluation. Spleen: Splenomegaly. Spleen measures 20 cm in length. Adrenals/Urinary Tract: Unremarkable adrenal glands. 1.6 cm lower pole right renal cyst. Kidneys appear otherwise within normal limits. No renal calculi. No hydronephrosis. Nondilated ureters. Urinary bladder appears unremarkable. Stomach/Bowel: Stomach is within normal limits. Appendix not clearly visualized. Scattered colonic diverticulosis. No evidence of bowel wall thickening, distention, or inflammatory changes. Vascular/Lymphatic: Abdominal aorta is nonaneurysmal. Recanalization of the umbilical vein. Multiple upper abdominal varices suggesting portal hypertension. Portal vein appears patent. Small upper abdominal lymph nodes including within the porta hepatis. Reproductive: Prostate is unremarkable. Other: Moderate to large volume ascites. No pneumoperitoneum. Musculoskeletal: Rim enhancing fluid and air collection within the left gluteal soft tissues at site of previously seen gluteal abscess. Approximate measurements of the collection are 6.8 x 4.1 cm trans-axially by 16.7 cm craniocaudally (series 3, image 106; series 10, image 156). Extensive surrounding subcutaneous edema overlying the left hip and proximal thigh as well as the bilateral flanks. No acute osseous abnormality. No areas of cortical destruction to suggest osteomyelitis. IMPRESSION: 1. Rim-enhancing fluid and air collection within the left gluteal soft tissues at site of previously seen gluteal abscess. Approximate measurements of the collection are 6.8 x 4.1 x 16.7 cm. No areas of  underlying cortical destruction to suggest osteomyelitis. 2. Cirrhosis with stigmata of portal hypertension including splenomegaly and moderate to large volume ascites. 3. Small left and trace right pleural effusions. These results were called by telephone at the time of interpretation on 09/12/2019 at 10:18 am to provider [facility], who verbally acknowledged these results. Electronically Signed   By: NDavina PokeD.O.   On: 09/12/2019 10:19    EKG: Independently reviewed. Sinus, nonspecific T wave changes    Time spent:60 minutes Code Status:   FULL Family Communication: spouse updated on phone 3/5 Disposition Plan: expect 2-3 day hospitalization Consults called: Ortho--Nitka, ID--[place] DVT Prophylaxis: [place] Lovenox  DOrson Eva DO  [facility] Pager [phone] If 7PM-7AM, please contact night-coverage [email or web address] Password [email or web address] 09/12/2019, 1:35 PM

## 2019-09-13 ENCOUNTER — Other Ambulatory Visit: Payer: Self-pay | Admitting: Orthopedic Surgery

## 2019-09-13 ENCOUNTER — Inpatient Hospital Stay (HOSPITAL_COMMUNITY): Payer: Self-pay

## 2019-09-13 ENCOUNTER — Other Ambulatory Visit (HOSPITAL_COMMUNITY): Payer: Self-pay

## 2019-09-13 ENCOUNTER — Encounter (HOSPITAL_COMMUNITY): Payer: Self-pay | Admitting: Internal Medicine

## 2019-09-13 DIAGNOSIS — Z872 Personal history of diseases of the skin and subcutaneous tissue: Secondary | ICD-10-CM

## 2019-09-13 DIAGNOSIS — Z881 Allergy status to other antibiotic agents status: Secondary | ICD-10-CM

## 2019-09-13 DIAGNOSIS — R601 Generalized edema: Secondary | ICD-10-CM

## 2019-09-13 DIAGNOSIS — M869 Osteomyelitis, unspecified: Secondary | ICD-10-CM

## 2019-09-13 DIAGNOSIS — R609 Edema, unspecified: Secondary | ICD-10-CM

## 2019-09-13 DIAGNOSIS — Z87891 Personal history of nicotine dependence: Secondary | ICD-10-CM

## 2019-09-13 DIAGNOSIS — B9689 Other specified bacterial agents as the cause of diseases classified elsewhere: Secondary | ICD-10-CM

## 2019-09-13 DIAGNOSIS — Z8619 Personal history of other infectious and parasitic diseases: Secondary | ICD-10-CM

## 2019-09-13 DIAGNOSIS — K7460 Unspecified cirrhosis of liver: Principal | ICD-10-CM

## 2019-09-13 HISTORY — PX: IR US GUIDE BX ASP/DRAIN: IMG2392

## 2019-09-13 HISTORY — PX: IR PARACENTESIS: IMG2679

## 2019-09-13 LAB — BASIC METABOLIC PANEL
Anion gap: 14 (ref 5–15)
BUN: 12 mg/dL (ref 6–20)
CO2: 18 mmol/L — ABNORMAL LOW (ref 22–32)
Calcium: 8.1 mg/dL — ABNORMAL LOW (ref 8.9–10.3)
Chloride: 108 mmol/L (ref 98–111)
Creatinine, Ser: 0.66 mg/dL (ref 0.61–1.24)
GFR calc Af Amer: >60 mL/min (ref >60)
GFR calc non Af Amer: >60 mL/min (ref >60)
Glucose, Bld: 100 mg/dL — ABNORMAL HIGH (ref 70–99)
Potassium: 5.6 mmol/L — ABNORMAL HIGH (ref 3.5–5.1)
Sodium: 140 mmol/L (ref 135–145)

## 2019-09-13 LAB — HEPATITIS A ANTIBODY, TOTAL: hep A Total Ab: REACTIVE — AB

## 2019-09-13 LAB — HEPATITIS B SURFACE ANTIGEN: Hepatitis B Surface Ag: NONREACTIVE

## 2019-09-13 LAB — GRAM STAIN

## 2019-09-13 LAB — CBC
HCT: 30.8 % — ABNORMAL LOW (ref 39.0–52.0)
Hemoglobin: 9.3 g/dL — ABNORMAL LOW (ref 13.0–17.0)
MCH: 25 pg — ABNORMAL LOW (ref 26.0–34.0)
MCHC: 30.2 g/dL (ref 30.0–36.0)
MCV: 82.8 fL (ref 80.0–100.0)
Platelets: 121 10*3/uL — ABNORMAL LOW (ref 150–400)
RBC: 3.72 MIL/uL — ABNORMAL LOW (ref 4.22–5.81)
RDW: 16.3 % — ABNORMAL HIGH (ref 11.5–15.5)
WBC: 4.8 10*3/uL (ref 4.0–10.5)
nRBC: 0 % (ref 0.0–0.2)

## 2019-09-13 LAB — HEPATITIS B SURFACE ANTIBODY,QUALITATIVE: Hep B S Ab: NONREACTIVE

## 2019-09-13 LAB — GLUCOSE, CAPILLARY
Glucose-Capillary: 100 mg/dL — ABNORMAL HIGH (ref 70–99)
Glucose-Capillary: 86 mg/dL (ref 70–99)
Glucose-Capillary: 118 mg/dL — ABNORMAL HIGH (ref 70–99)
Glucose-Capillary: 117 mg/dL — ABNORMAL HIGH (ref 70–99)

## 2019-09-13 LAB — HEPATITIS C ANTIBODY: HCV Ab: NONREACTIVE

## 2019-09-13 LAB — ALBUMIN, PLEURAL OR PERITONEAL FLUID: Albumin, Fluid: <1 g/dL

## 2019-09-13 LAB — BODY FLUID CELL COUNT WITH DIFFERENTIAL
Eos, Fluid: 0 %
Lymphs, Fluid: 44 %
Monocyte-Macrophage-Serous Fluid: 46 % — ABNORMAL LOW (ref 50–90)
Neutrophil Count, Fluid: 10 % (ref 0–25)
Total Nucleated Cell Count, Fluid: 191 cu mm (ref 0–1000)

## 2019-09-13 IMAGING — US IR US GUIDANCE
1 series · 5 of 5 positions shown · non-contrast
Comparison: CT abdomen and pelvis-[DATE];

INDICATION: New diagnosis of cirrhosis, now with symptomatic intra-abdominal
ascites.
TECHNIQUE: Informed written consent was obtained from the patient after a
discussion of the risks, benefits and alternatives to treatment. A
timeout was performed prior to the initiation of the procedure.

[Series 1: ir us guidance · 5 of 5 slices shown]
[im 1/5]
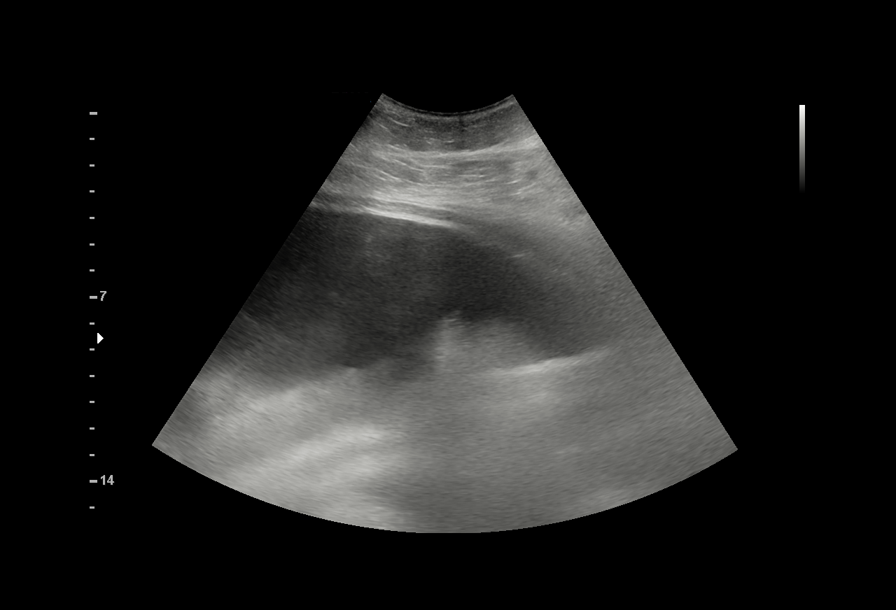
[im 2/5]
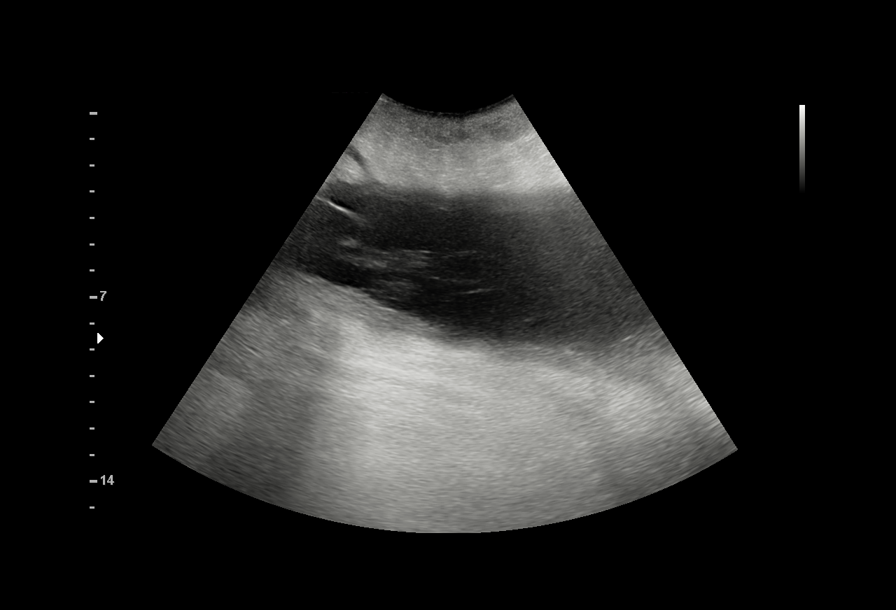
[im 3/5]
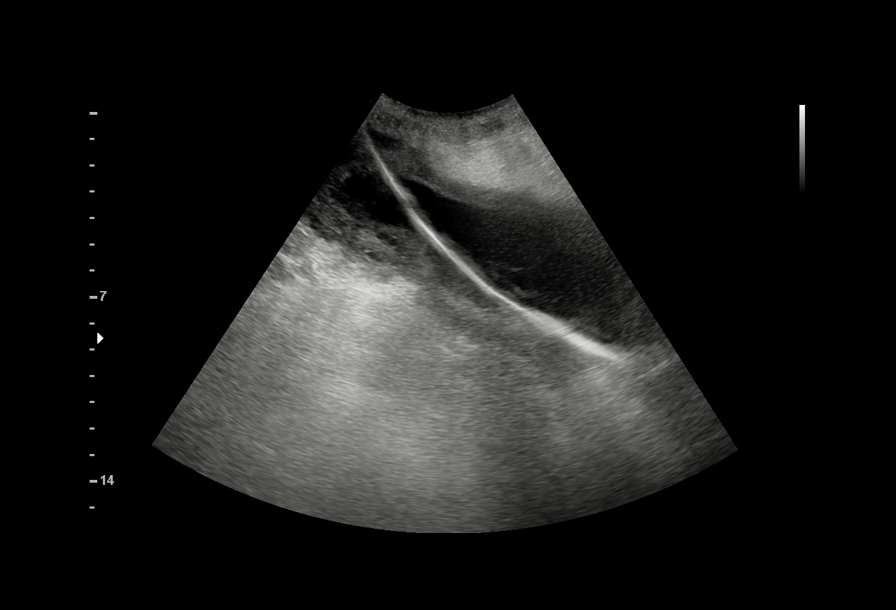
[im 4/5]
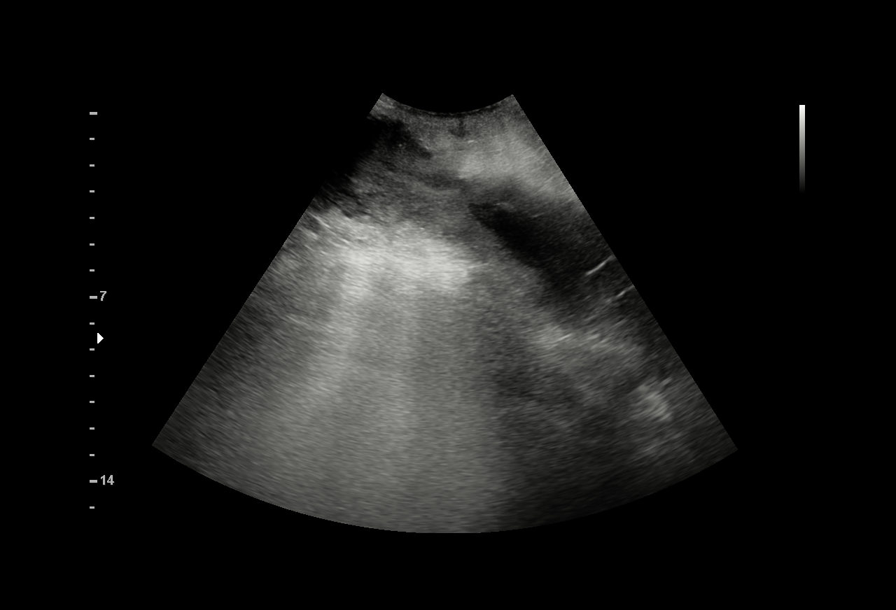
[im 5/5]
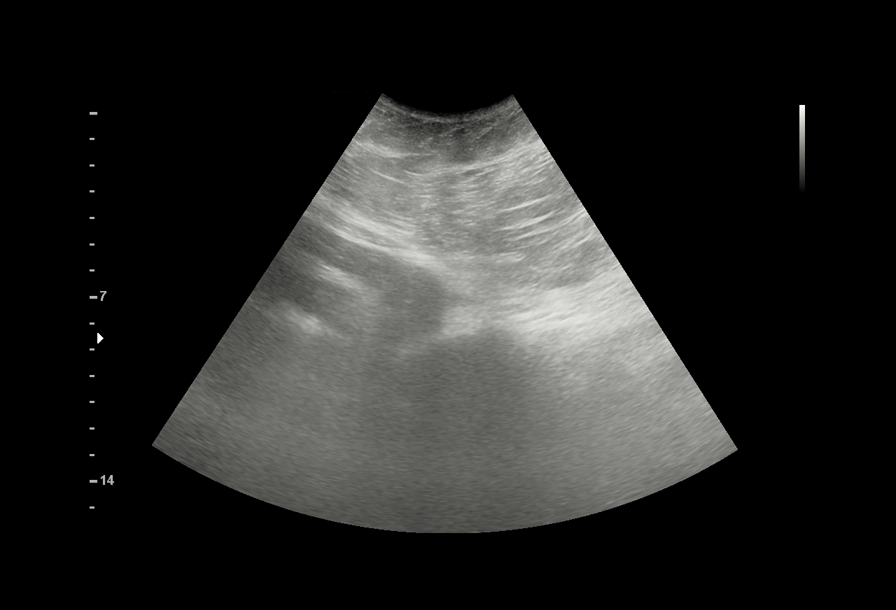

[5 of 5 positions shown; findings below may reference images not displayed]

Additionally, patient with history of superinfected hematoma
previously requiring image guided drainage catheter placement on
[DATE] with multiple subsequent operative debridement.

Preceding CT scan demonstrated a recurrent fluid collection about
the left lateral thigh and as such request made for image guided
aspiration for diagnostic purposes.

EXAM:
1. ULTRASOUND-GUIDED PARACENTESIS
2. ULTRASOUND-GUIDED ASPIRATION OF INDETERMINATE RECURRENT FLUID
COLLECTION WITHIN THE SUPEROLATERAL ASPECT OF THE LEFT THIGH.
image guided left
lateral thigh percutaneous drainage catheter placement-[DATE]

MEDICATIONS:
None.

COMPLICATIONS:
None immediate.
Initial ultrasound scanning demonstrates a moderate amount of
ascites within the left lower abdomen which was subsequently prepped
and draped in the usual sterile fashion. 1% lidocaine with
epinephrine was used for local anesthesia. An ultrasound image was
saved for documentation purposed. An 8 Fr Safe-T-Centesis catheter
was introduced. The paracentesis was performed ultimately yielding 9
L of serous ascitic fluid. A representative sample was capped and
sent to the laboratory for analysis. The catheter was removed.

_________________________________________________________

Attention was now paid towards ultrasound-guided aspiration of
recurrent indeterminate fluid collection within the subcutaneous
tissues about the anterior superolateral aspect the left thigh.

Sonographic evaluation demonstrates a at least 15.1 x 5.9 cm
minimally complex fluid collection within the subcutaneous tissues
about the superolateral aspect of the left thigh, subjacent to
overlying surgical incision.

The skin overlying the operative site with prepped and draped in
usual sterile fashion. 1% lidocaine with epinephrine was utilized
for local anesthesia. Under direct ultrasound guidance, an 18 gauge
trocar needle was advanced into a cystic component of the complex
fluid collection and a short Amplatz wire was coiled within the
collection. Multiple image was saved procedural documentation
purposes

Next, the 18 gauge trocar needle was exchanged for a 15 cm MENUNGGUMU
MENUNGGUMU catheter which was utilized to aspirate approximately 350 cc
of serous non foul smelling fluid as the catheter was slowly
retracted. A representative sample was capped and sent to the
laboratory for analysis.

Dressings were applied. The patient tolerated the above procedures
well without immediate postprocedural complication.
FINDINGS: A total of approximately 9 liters of serous fluid was removed.

Technically successful ultrasound-guided aspiration of 350 cc of
serous non foul smelling fluid from minimally complex fluid
collection within the subcutaneous tissues about the superolateral
aspect the left thigh, subjacent to the overlying surgical incision.

Representative samples were sent separately from each collection to
the laboratory for analysis.
IMPRESSION: 1. Successful ultrasound-guided paracentesis yielding 9 liters of
peritoneal fluid.
2. Successful ultrasound-guided aspiration of 350 cc of serous, non
foul smelling fluid from minimally complex recurrent fluid
collection with the subcutaneous tissues about the superolateral
aspect of the left thigh, subjacent to the overlying surgical
incision.
3. Representative samples were sent separately from each collection
to the laboratory for analysis.

## 2019-09-13 IMAGING — MR MR HIP*L* WO/W CM
9 series · 40 of 40 positions shown · IV contrast (gadavist)
Comparison: CT scan [DATE] and prior MRI [DATE] and
[DATE]

CLINICAL DATA: Severe left hip pain. Follow-up gluteal abscess.

EXAM:
MRI OF THE LEFT HIP WITHOUT AND WITH CONTRAST
TECHNIQUE: Multiplanar, multisequence MR imaging was performed both before and
after administration of intravenous contrast.
CONTRAST:  10mL GADAVIST GADOBUTROL 1 MMOL/ML IV SOLN

[Series 5: T1 · coronal · left · 4.0mm · 1.17mm/px · 4 of 44 slices shown]
[im 1/44]
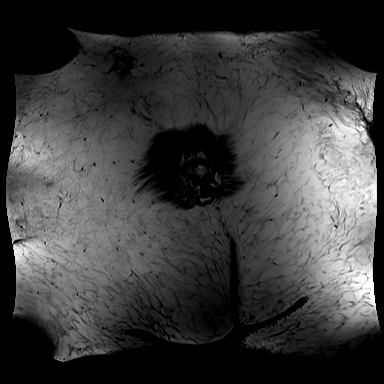
[im 15/44]
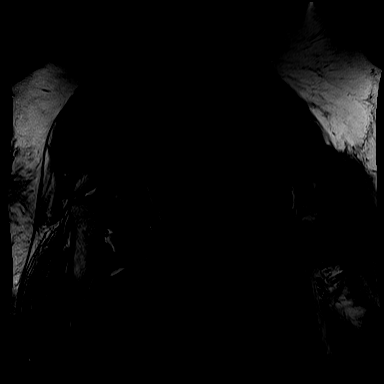
[im 29/44]
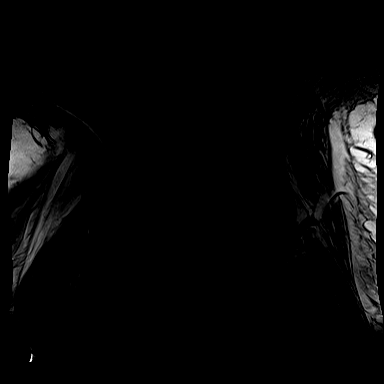
[im 44/44]
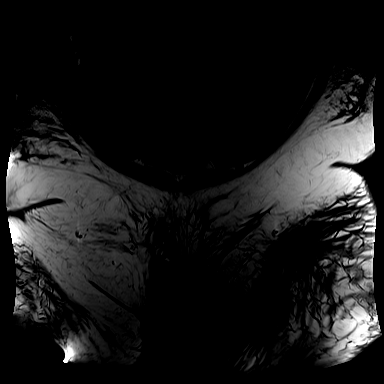

[Series 6: T2 fat-sat · coronal · left · 4.0mm · 1.00mm/px · 5 of 44 slices shown (1 of 2)]
[im 1/44]
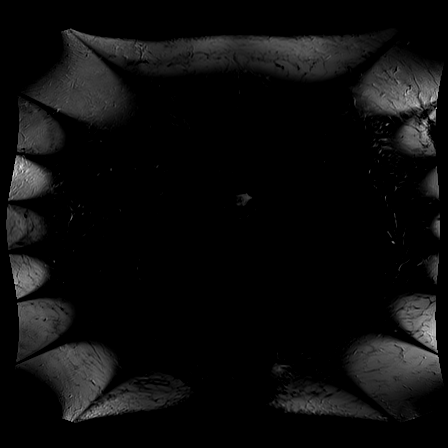
[im 11/44]
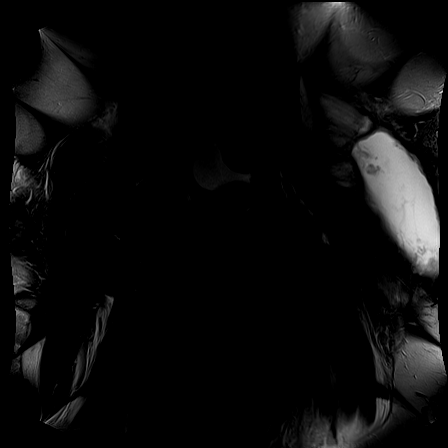
[im 22/44]
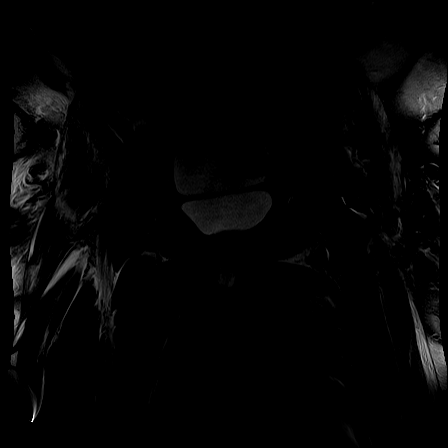
[im 33/44]
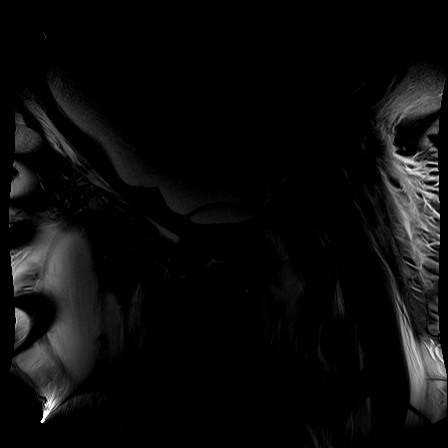
[im 44/44]
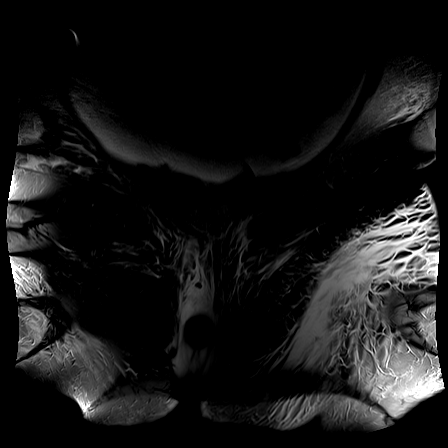

[Series 7: T2 fat-sat · axial · left · 4.0mm · 0.39mm/px · z∈[-106,+88]mm · 5 of 40 slices shown (2 of 2)]
[im 1/40]
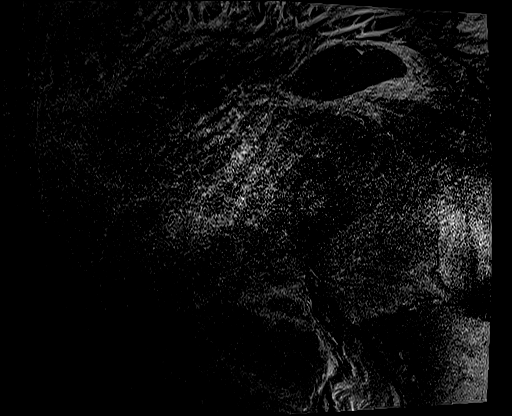
[im 10/40]
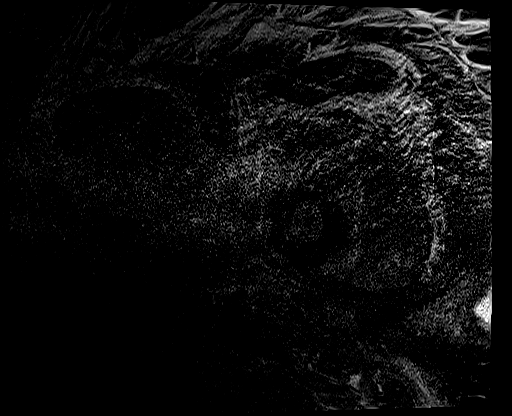
[im 20/40]
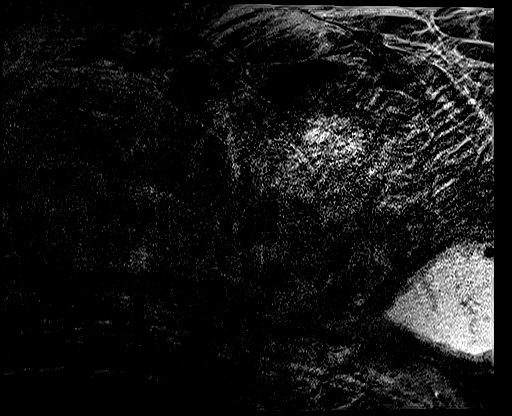
[im 30/40]
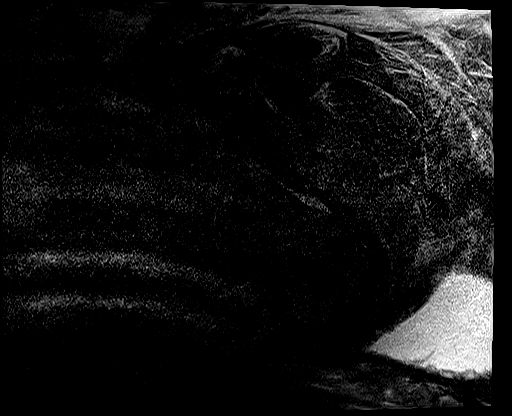
[im 40/40]
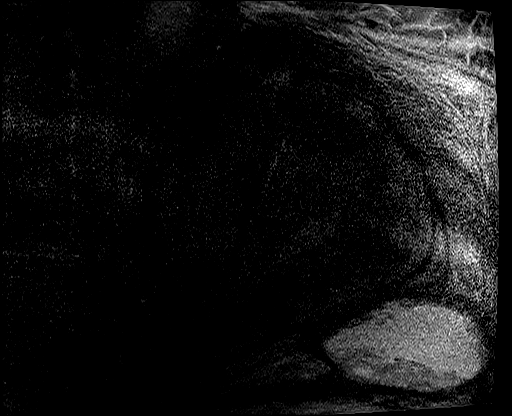

[Series 8: PD fat-sat · sagittal · left · 4.0mm · 0.62mm/px · 4 of 35 slices shown (1 of 2)]
[im 1/35]
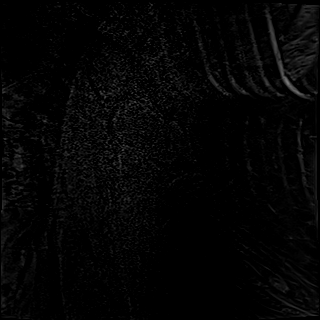
[im 12/35]
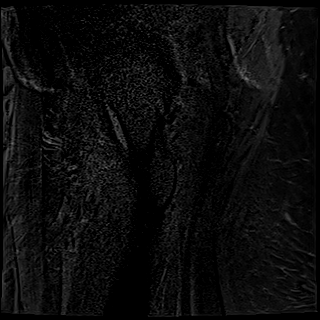
[im 23/35]
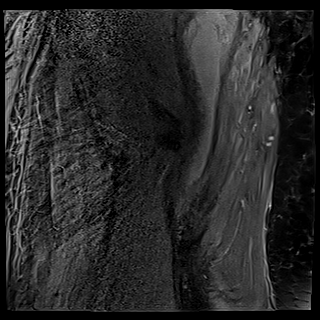
[im 35/35]
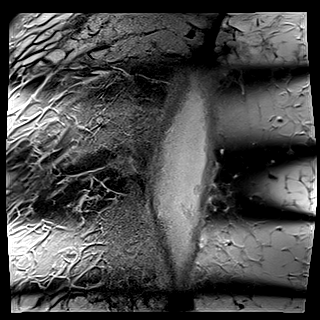

[Series 9: STIR · axial · left · 4.0mm · 0.78mm/px · z∈[-106,+88]mm · 5 of 40 slices shown]
[im 1/40]
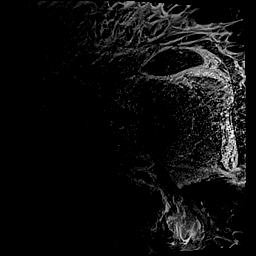
[im 10/40]
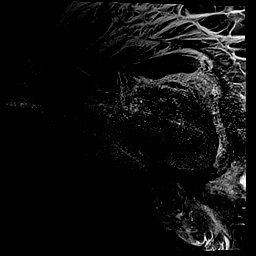
[im 20/40]
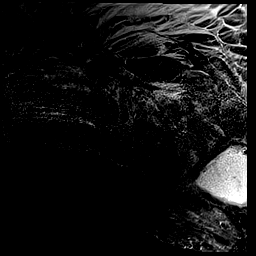
[im 30/40]
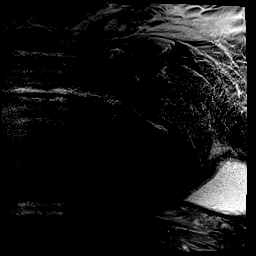
[im 40/40]
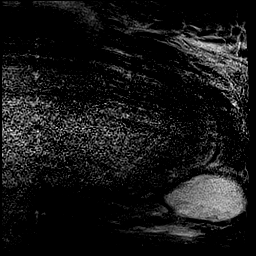

[Series 11: T1 fat-sat · axial · non-contrast · left · 4.0mm · 0.96mm/px · z∈[-106,+128]mm · 6 of 48 slices shown]
[im 1/48]
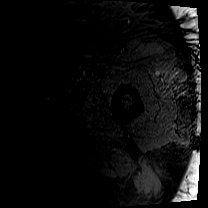
[im 10/48]
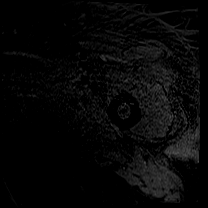
[im 19/48]
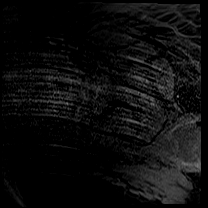
[im 29/48]
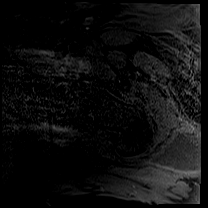
[im 38/48]
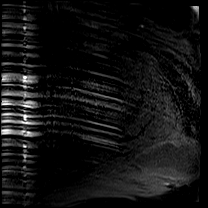
[im 48/48]
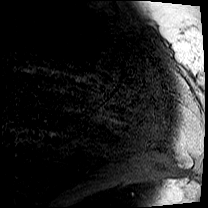

[Series 12: PD fat-sat · coronal · left · 3.0mm · 0.86mm/px · 3 of 22 slices shown (2 of 2)]
[im 1/22]
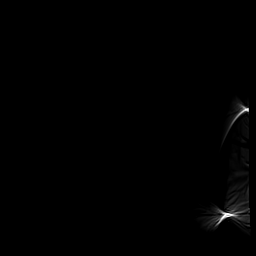
[im 11/22]
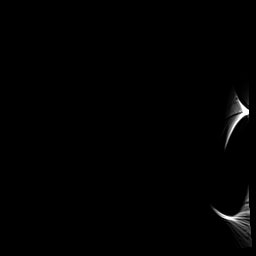
[im 22/22]
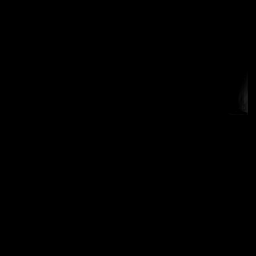

[Series 13: T1 fat-sat post-contrast · axial · left · 4.0mm · 0.96mm/px · z∈[-106,+128]mm · 6 of 48 slices shown (1 of 2)]
[im 1/48]
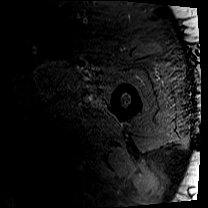
[im 10/48]
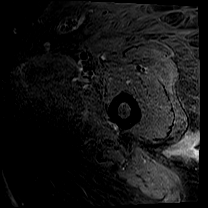
[im 19/48]
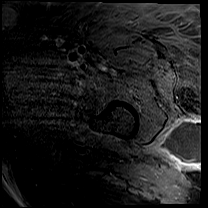
[im 29/48]
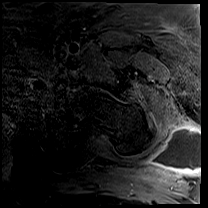
[im 38/48]
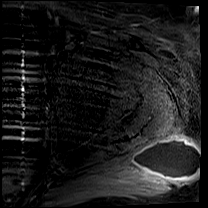
[im 48/48]
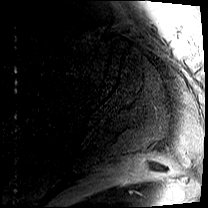

[Series 14: T1 fat-sat post-contrast · coronal · left · 3.0mm · 0.96mm/px · 2 of 21 slices shown (2 of 2)]
[im 1/21]
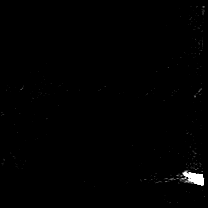
[im 21/21]
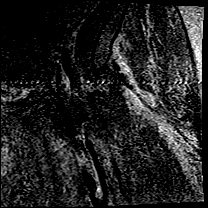

[40 of 40 positions shown; findings below may reference images not displayed]

FINDINGS: Examination is quite limited due to body habitus and patient motion.

As demonstrated on the CT scan there is a large thick walled abscess
in the left gluteal area in the gluteus maximus muscle laterally.
This measures approximately 19 x 5.5 cm and contains gas and
demonstrates moderate rim enhancement. There is also surrounding
inflammatory changes with significant myofasciitis involving the
left gluteal muscles and to a lesser extent the left hip
musculature. No definite findings for other abscesses.

No findings suspicious for septic arthritis involving the left hip
or osteomyelitis involving the left femur or left hemipelvis.

Mild myositis involving the right hip and pelvic muscles.

The pubic symphysis and SI joints are intact. No findings for septic
arthritis.

Diffuse subcutaneous soft tissue swelling/edema/fluid consistent
with cellulitis.

Mildly enlarged inguinal lymph nodes are likely
inflammatory/hyperplastic.

Large volume pelvic ascites related to patient's known cirrhosis.
IMPRESSION: 1. 19 x 5.5 cm thick walled abscess in the left gluteus maximus
muscle laterally.
2. Significant surrounding myofasciitis.
3. No findings for septic arthritis involving the left hip or left
hemipelvis.
4. Large volume pelvic ascites related to patient's known cirrhosis.
5. Mild myositis involving the right hip and pelvic muscles.

## 2019-09-13 MED ORDER — MIDAZOLAM HCL 2 MG/2ML IJ SOLN
INTRAMUSCULAR | Status: AC
  Filled 2019-09-13: qty 2

## 2019-09-13 MED ORDER — SODIUM CHLORIDE 0.9 % IV SOLN
3.0000 g | Freq: Four times a day (QID) | INTRAVENOUS | Status: DC
  Administered 2019-09-13 – 2019-09-15 (×7): 3 g via INTRAVENOUS
  Filled 2019-09-13 (×5): qty 8
  Filled 2019-09-13 (×2): qty 3
  Filled 2019-09-13 (×3): qty 8

## 2019-09-13 MED ORDER — GADOBUTROL 1 MMOL/ML IV SOLN
10.0000 mL | Freq: Once | INTRAVENOUS | Status: AC | PRN
  Administered 2019-09-13: 10 mL via INTRAVENOUS

## 2019-09-13 MED ORDER — ALBUMIN HUMAN 25 % IV SOLN
25.0000 g | Freq: Four times a day (QID) | INTRAVENOUS | Status: AC
  Administered 2019-09-13: 25 g via INTRAVENOUS
  Administered 2019-09-13: 12.5 g via INTRAVENOUS
  Administered 2019-09-13: 25 g via INTRAVENOUS
  Filled 2019-09-13 (×3): qty 100

## 2019-09-13 MED ORDER — LIDOCAINE HCL (PF) 1 % IJ SOLN
INTRAMUSCULAR | Status: AC
  Filled 2019-09-13: qty 30

## 2019-09-13 MED ORDER — FENTANYL CITRATE (PF) 100 MCG/2ML IJ SOLN
INTRAMUSCULAR | Status: AC | PRN
  Administered 2019-09-13: 50 ug via INTRAVENOUS

## 2019-09-13 MED ORDER — LIDOCAINE HCL 1 % IJ SOLN
INTRAMUSCULAR | Status: AC
  Filled 2019-09-13: qty 20

## 2019-09-13 MED ORDER — SPIRONOLACTONE 25 MG PO TABS: 100.0000 mg | ORAL_TABLET | Freq: Every day | ORAL | Status: DC

## 2019-09-13 MED ORDER — SODIUM ZIRCONIUM CYCLOSILICATE 10 G PO PACK
10.0000 g | PACK | Freq: Every day | ORAL | Status: DC
  Administered 2019-09-13: 10 g via ORAL
  Filled 2019-09-13: qty 1

## 2019-09-13 MED ORDER — FENTANYL CITRATE (PF) 100 MCG/2ML IJ SOLN
INTRAMUSCULAR | Status: AC
  Filled 2019-09-13: qty 2

## 2019-09-13 NOTE — Progress Notes (Addendum)
Daily Rounding Note  09/13/2019, 2:16 PM  LOS: 1 day   SUBJECTIVE:   Chief complaint: Decompensated cirrhosis, ascites.     Belly feels a lot less tight after 9 L paracentesis.  Feels like his legs are more comfortable as well. Sono tech just completed Doppler studies to rule out DVT and has found none. Urine output not measured but patient says he is peed at least 10 or 12 times in the last 12 hours.  OBJECTIVE:         Vital signs in last 24 hours:    Temp:  [97.3 F (36.3 C)-98 F (36.7 C)] 97.3 F (36.3 C) (03/06 1000) Pulse Rate:  [87-101] 93 (03/06 1000) Resp:  [16-21] 20 (03/06 1000) BP: (115-140)/(52-79) 131/78 (03/06 1000) SpO2:  [97 %-100 %] 97 % (03/06 1000) Last BM Date: 09/12/19 There were no vitals filed for this visit. General: Comfortable, talkative, does not look toxic or acutely ill but looks somewhat chronically unwell. Heart: RRR. Chest: No labored breathing or cough. Abdomen: Obese.  Markedly softer and less distended but obese.  Active bowel sounds.  Minimal if any tenderness. Extremities: Right greater than left lower extremity edema/anasarca well into the thighs.  This looks better than it did yesterday afternoon. . Neuro/Psych: No tremor.  No asterixis.  Somewhat anxious, pleasant, asking appropriate questions.  Not confused.  Intake/Output from previous day: 03/05 0701 - 03/06 0700 In: 400 [IV Piggyback:400] Out: -   Intake/Output this shift: No intake/output data recorded.  Lab Results: Recent Labs    09/11/19 1702 09/12/19 0744 09/13/19 0513  WBC 5.7 3.9* 4.8  HGB 10.2* 10.5* 9.3*  HCT 33.7* 34.8* 30.8*  PLT 143* 125* 121*   BMET Recent Labs    09/11/19 1702 09/12/19 0744 09/13/19 0513  NA 140 141 140  K 4.0 3.5 5.6*  CL 109 108 108  CO2 23 24 18*  GLUCOSE 125* 105* 100*  BUN 14 15 12   CREATININE 0.77 0.72 0.66  CALCIUM 8.6* 8.6* 8.1*   LFT Recent Labs     09/12/19 0744  PROT 6.7  ALBUMIN 2.3*  AST 61*  ALT 21  ALKPHOS 126  BILITOT 1.1   PT/INR No results for input(s): LABPROT, INR in the last 72 hours. Hepatitis Panel Recent Labs    09/13/19 0513  HEPBSAG NON REACTIVE  HCVAB NON REACTIVE    Studies/Results: DG Chest 2 View  Result Date: 09/11/2019 CLINICAL DATA:  54 year old male with chest pain and shortness of breath. EXAM: CHEST - 2 VIEW COMPARISON:  Chest radiograph dated 06/19/2019. FINDINGS: There is shallow inspiration with bibasilar atelectasis. No focal consolidation, pleural effusion, or pneumothorax. Stable cardiac silhouette. No acute osseous pathology. IMPRESSION: No acute cardiopulmonary process. Electronically Signed   By: Anner Crete M.D.   On: 09/11/2019 17:57   MR HIP LEFT W WO CONTRAST  Result Date: 09/13/2019 CLINICAL DATA:  Severe left hip pain. Follow-up gluteal abscess. EXAM: MRI OF THE LEFT HIP WITHOUT AND WITH CONTRAST TECHNIQUE: Multiplanar, multisequence MR imaging was performed both before and after administration of intravenous contrast. CONTRAST:  23m GADAVIST GADOBUTROL 1 MMOL/ML IV SOLN COMPARISON:  CT scan 09/12/2019 and prior MRI 07/10/2019 and 06/13/2019 FINDINGS: Examination is quite limited due to body habitus and patient motion. As demonstrated on the CT scan there is a large thick walled abscess in the left gluteal area in the gluteus maximus muscle laterally. This measures approximately 19 x 5.5 cm  and contains gas and demonstrates moderate rim enhancement. There is also surrounding inflammatory changes with significant myofasciitis involving the left gluteal muscles and to a lesser extent the left hip musculature. No definite findings for other abscesses. No findings suspicious for septic arthritis involving the left hip or osteomyelitis involving the left femur or left hemipelvis. Mild myositis involving the right hip and pelvic muscles. The pubic symphysis and SI joints are intact. No findings  for septic arthritis. Diffuse subcutaneous soft tissue swelling/edema/fluid consistent with cellulitis. Mildly enlarged inguinal lymph nodes are likely inflammatory/hyperplastic. Large volume pelvic ascites related to patient's known cirrhosis. IMPRESSION: 1. 19 x 5.5 cm thick walled abscess in the left gluteus maximus muscle laterally. 2. Significant surrounding myofasciitis. 3. No findings for septic arthritis involving the left hip or left hemipelvis. 4. Large volume pelvic ascites related to patient's known cirrhosis. 5. Mild myositis involving the right hip and pelvic muscles. Electronically Signed   By: Marijo Sanes M.D.   On: 09/13/2019 10:20   CT ABDOMEN PELVIS W CONTRAST  Result Date: 09/12/2019 CLINICAL DATA:  Abdominal distension, ascites EXAM: CT ABDOMEN AND PELVIS WITH CONTRAST TECHNIQUE: Multidetector CT imaging of the abdomen and pelvis was performed using the standard protocol following bolus administration of intravenous contrast. CONTRAST:  143m OMNIPAQUE IOHEXOL 300 MG/ML  SOLN COMPARISON:  CT 06/20/2019 FINDINGS: Lower chest: Small left and trace right pleural effusions with associated compressive atelectasis. Normal heart size. No pericardial effusion. Hepatobiliary: Shrunken, nodular appearance of the liver suggesting cirrhosis. No focal liver lesion identified. Prior cholecystectomy. Pancreas: Appears unremarkable without pancreatic ductal dilatation. No definite peripancreatic inflammatory changes although fluid from adjacent ascites slightly limits the evaluation. Spleen: Splenomegaly. Spleen measures 20 cm in length. Adrenals/Urinary Tract: Unremarkable adrenal glands. 1.6 cm lower pole right renal cyst. Kidneys appear otherwise within normal limits. No renal calculi. No hydronephrosis. Nondilated ureters. Urinary bladder appears unremarkable. Stomach/Bowel: Stomach is within normal limits. Appendix not clearly visualized. Scattered colonic diverticulosis. No evidence of bowel wall  thickening, distention, or inflammatory changes. Vascular/Lymphatic: Abdominal aorta is nonaneurysmal. Recanalization of the umbilical vein. Multiple upper abdominal varices suggesting portal hypertension. Portal vein appears patent. Small upper abdominal lymph nodes including within the porta hepatis. Reproductive: Prostate is unremarkable. Other: Moderate to large volume ascites. No pneumoperitoneum. Musculoskeletal: Rim enhancing fluid and air collection within the left gluteal soft tissues at site of previously seen gluteal abscess. Approximate measurements of the collection are 6.8 x 4.1 cm trans-axially by 16.7 cm craniocaudally (series 3, image 106; series 10, image 156). Extensive surrounding subcutaneous edema overlying the left hip and proximal thigh as well as the bilateral flanks. No acute osseous abnormality. No areas of cortical destruction to suggest osteomyelitis. IMPRESSION: 1. Rim-enhancing fluid and air collection within the left gluteal soft tissues at site of previously seen gluteal abscess. Approximate measurements of the collection are 6.8 x 4.1 x 16.7 cm. No areas of underlying cortical destruction to suggest osteomyelitis. 2. Cirrhosis with stigmata of portal hypertension including splenomegaly and moderate to large volume ascites. 3. Small left and trace right pleural effusions. These results were called by telephone at the time of interpretation on 09/12/2019 at 10:18 am [facility] verbally acknowledged these results. Electronically Signed   By: NDavina PokeD.O.   On: 09/12/2019 10:19   ECHOCARDIOGRAM COMPLETE  Result Date: 09/12/2019 IMPRESSIONS  1. Normal LV function; trace MR; ascites noted on subcostal views.  2. Left ventricular ejection fraction, by estimation, is 60 to 65%. The  left ventricle has normal function. The left ventricle has no regional wall motion abnormalities. Left ventricular diastolic parameters were normal.  3. Right ventricular systolic  function is normal. The right ventricular size is normal.  4. The mitral valve is normal in structure and function. No evidence of mitral valve regurgitation. No evidence of mitral stenosis.  5. The aortic valve is tricuspid. Aortic valve regurgitation is not visualized. No aortic stenosis is present.  6. The inferior vena cava is normal in size with greater than 50% respiratory variability, suggesting right atrial pressure of 3 mmHg.    Electronically signed by Kirk Ruths MD Signature Date/Time: 09/12/2019/4:38:39 PM    Final     ASSESMENT:   *   Decompensated cirrhosis.  Suspected due to NASH. HCV Ab, HBV Surface Ag, Hep B surface Ab all non-reactive Hep A total AB positive.   Awaiting other cirrhosis/hepatitis related labs.  *    Ascites/anasarca.  Progressive.  No diuretics PTA.   Underwent 9 L paracentesis today.  Has received first of 3, q 6 hour infusions of IV albumin.  Has not yet received Aldactone 100 mg, did receive 40 mg Lasix IV last night and this is ordered for twice a day currently. No evidence for SBP.  *     Portal hypertension seen on CT scan.  Has not had EGD to confirm portal hypertensive gastropathy or survey for varices.  *    Normocytic anemia.  Overall Hb improved in the last several weeks.  *    Splenomegaly, intermittent thrombocytopenia.  *   Hyperkalemia.  Kayexelate ordered.    *    Trace bilateral pleural effusions.  Normal/unremarkable 2D echo and TEE in 06/2019/echocardiogram repeated yesterday and still unremarkable.  *    Complicated left hip abscess, multiple I&D's.  Abscess recurrent per CT scan several weeks of inpatient and outpatient IV antibiotics over the last 3 months.  Dr. Graylon Good of infectious disease recommends IR drainage, additional 6 weeks of IV antibiotics and Dr. Jess Barters opinion.  *    Normocytic anemia   PLAN   *   Holding Aldactone for now in setting of hyperkalemia.  It is set to start tomorrow morning. Eventually would like pt to  be on combo of lasix/aldactone  *   Strict I's and O's, daily standing weights.  Daily BMET  *    Eventually will need EGD for variceal screening and colonoscopy for screening.  These are not urgent. Will also need vaccination against hepatitis B, can be performed through Dr. Donneta Romberg office.   Azucena Freed  09/13/2019, 2:16 PM Phone [phone]     Attending Physician Note   I have taken an interval history, reviewed the chart and examined the patient. I agree with the Advanced Practitioner's note, impression and recommendations.   * Decompensated cirrhosis with ascites, anasarca, splenomegaly. Suspected NASH. Multiple hepatic serologies pending. Hep B and C negative. Total Hep A Ab positive, likely IgG. 9L LVP today.  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  Hep B vaccination at outpatient  EGD for variceal screening and colonoscopy for CRC screening as outpatient when infection resolved and ascites better controlled with Dr. Roseanna Rainbow, MD [facility]

## 2019-09-13 NOTE — Progress Notes (Signed)
PROGRESS NOTE        PATIENT DETAILS Name: Robert Sullivan Age: 54 y.o. Sex: male Date of Birth: 07-26-1965 Admit Date: 09/11/2019 Admitting Physician Orson Eva, MD ASN:[id number], Tenna Child, PA-C  Brief Narrative: Patient is a 54 y.o. male with history of Karlene Lineman cirrhosis-recent history of MSSA bacteremia-recurrent left gluteal abscess-s/p numerous readmissions/debridements-referred from GI office for evaluation of anasarca in the setting of decompensated liver cirrhosis.  Found to have rim enhancing lesion on CT abdomen on admission within the left gluteal soft tissue area-indicating recurrent left gluteal abscess in spite of numerous incision and drainages.  See below for further details  Significant events this admit: 3/5>> admit to [facility] from GI office for evaluation of anasarca 3/6>> ultrasound guided large-volume paracentesis (9 L) by IR 3/6>> ultrasound-guided aspiration of left gluteal abscess by IR  Prior relevant admissions: 05/12/19>>05/17/19: Admit for sepsis secondary to UTI, left hip hematoma due to fall. 06/10/2019>> 06/30/2019: Admit for MSSA bacteremia, left gluteal abscess 07/09/2019>> 07/25/2019: Admit by ortho for recurrent left hip abscess-s/p debridement x3  Prior relevant Procedures: 07/18/19>> repeat irrigation and debridement of left hip 07/16/19>> left hip debridement 07/12/19>> left hip debridement 06/16/19>> TEE-no vegetation 06/17/19>> ultrasound-guided left gluteal abscess drain placement by IR 07/09/19>> I&D of left hip abscess 06/25/19>> I&D of left gluteal abscess 06/24/19>> I&D of left gluteal abscess  Microbiology: 09/13/19>>left gluteus aspiration culture:pending  09/12/19>>blood culture: Pending 07/16/19>> tissue left hip: Enterococcus faecalis, Finegoldia magna 07/09/19>>Tissue left hip: Enterobacter cloacae 06/17/19>> left gluteal abscess: MSSA 06/10/19>> blood culture: MSSA 06/10/19>> urine culture: MSSA 05/12/19>> urine culture: E.  Coli  Consults: IR ID Ortho  Subjective: Lying comfortably in bed eating lunch.  Denies any abdominal pain or chest pain.  Acknowledges worsening edema over the past month or so.  Assessment/Plan: Anasarca/ascites/bilateral pleural effusion: Secondary to decompensated liver cirrhosis  UA without evidence of proteinuria, echo with preserved EF.  Underwent large-volume paracentesis (9 L) today.  Continue intravenous Lasix with IV albumin-due to hyperkalemia-we will plan on starting Aldactone tomorrow.  Lower extremity Doppler pending.  Portal hypertension seen on CT imaging: Will need EGD at some point to confirm portal hypertensive gastropathy/survey for varices.  Recurrent left gluteal abscess: S/p ultrasound-guided aspiration-cultures pending.  Appreciate ID input-spoke with Dr. Velia Meyer vancomycin-start Unasyn.  Spoke with microbiology-they will track down the specimens from earlier today-have added on fungal/AFB cultures.  Dr. Sharol Given to evaluate later as well.  Hyperkalemia: Hold Aldactone-continue Lasix-starting Lokelma.  DM-2 (A1c 4.6 on 3/5): CBG stable-continue Lantus 5 units daily and SSI.  He was mostly n.p.o. for procedures-we will need to reassess tomorrow and adjust accordingly.  Recent Labs    09/12/19 2155 09/13/19 0642 09/13/19 1341  GLUCAP 105* 100* 86   Atypical chest pain: Resolved-troponins negative.  Thrombocytopenia: Mild-secondary to hypersplenism in the setting of liver cirrhosis.  Anemia: Probably secondary to chronic liver disease.  Follow for now.  Obesity: Estimated body mass index is 46.25 kg/m as calculated from the following:   Height as of 09/04/19: 6' 3"  (1.905 m).   Weight as of 09/04/19: 167.8 kg.    Diet: Diet Order            Diet Carb Modified Fluid consistency: Thin; Room service appropriate? Yes  Diet effective now               DVT Prophylaxis: Prophylactic Lovenox  Code Status: Full code   Family Communication: Left a  voicemail for spouse on 3/6  Disposition Plan: Probably home with home health  Barriers to Discharge: Massive anarsarca needing IV diuretics, recurrent left hip/gluteus abscess-on IV abx   Antimicrobial agents: Anti-infectives (From admission, onward)   Start     Dose/Rate Route Frequency Ordered Stop   09/12/19 2000  vancomycin (VANCOREADY) IVPB 1500 mg/300 mL     1,500 mg 150 mL/hr over 120 Minutes Intravenous Every 8 hours 09/12/19 1037     09/12/19 1045  vancomycin (VANCOREADY) IVPB 2000 mg/400 mL     2,000 mg 200 mL/hr over 120 Minutes Intravenous  Once 09/12/19 1037 09/12/19 1354      Time spent: 35 minutes-Greater than 50% of this time was spent in counseling, explanation of diagnosis, planning of further management, and coordination of care.  MEDICATIONS: Scheduled Meds:  enoxaparin (LOVENOX) injection  40 mg Subcutaneous Q24H   fentaNYL       furosemide  40 mg Intravenous BID   insulin aspart  0-5 Units Subcutaneous QHS   insulin aspart  0-9 Units Subcutaneous TID WC   insulin glargine  5 Units Subcutaneous QHS   lidocaine       multivitamin with minerals  1 tablet Oral Daily   sodium chloride flush  3 mL Intravenous Once   sodium zirconium cyclosilicate  10 g Oral Daily   [START ON 09/14/2019] spironolactone  100 mg Oral Daily   Continuous Infusions:  albumin human 12.5 g (09/13/19 1044)   vancomycin 1,500 mg (09/13/19 1330)   PRN Meds:.acetaminophen **OR** acetaminophen, ondansetron **OR** ondansetron (ZOFRAN) IV   PHYSICAL EXAM: Vital signs: Vitals:   09/12/19 1858 09/12/19 1956 09/13/19 0438 09/13/19 1000  BP: (!) 115/52 124/62 131/75 131/78  Pulse: (!) 101 87 99 93  Resp: 20 16 16 20   Temp: 98 F (36.7 C) 97.8 F (36.6 C) 97.9 F (36.6 C) (!) 97.3 F (36.3 C)  TempSrc: Oral Oral Oral Oral  SpO2: 100% 97% 97% 97%   There were no vitals filed for this visit. There is no height or weight on file to calculate BMI.   Gen Exam:Alert  awake-not in any distress HEENT:atraumatic, normocephalic Chest: B/L clear to auscultation anteriorly CVS:S1S2 regular Abdomen:soft non tender-massively distended-with significant dullness in the flanks. Extremities:+++ edema Neurology: Non focal Skin: no rash  I have personally reviewed following labs and imaging studies  LABORATORY DATA: CBC: Recent Labs  Lab 09/11/19 1702 09/12/19 0744 09/13/19 0513  WBC 5.7 3.9* 4.8  NEUTROABS  --  2.3  --   HGB 10.2* 10.5* 9.3*  HCT 33.7* 34.8* 30.8*  MCV 83.8 83.5 82.8  PLT 143* 125* 121*    Basic Metabolic Panel: Recent Labs  Lab 09/11/19 1702 09/12/19 0744 09/13/19 0513  NA 140 141 140  K 4.0 3.5 5.6*  CL 109 108 108  CO2 23 24 18*  GLUCOSE 125* 105* 100*  BUN 14 15 12   CREATININE 0.77 0.72 0.66  CALCIUM 8.6* 8.6* 8.1*    GFR: Estimated Creatinine Clearance: 180 mL/min (by C-G formula based on SCr of 0.66 mg/dL).  Liver Function Tests: Recent Labs  Lab 09/12/19 0744  AST 61*  ALT 21  ALKPHOS 126  BILITOT 1.1  PROT 6.7  ALBUMIN 2.3*   Recent Labs  Lab 09/12/19 0744  LIPASE 20   No results for input(s): AMMONIA in the last 168 hours.  Coagulation Profile: No results for input(s): INR, PROTIME in the last  168 hours.  Cardiac Enzymes: No results for input(s): CKTOTAL, CKMB, CKMBINDEX, TROPONINI in the last 168 hours.  BNP (last 3 results) No results for input(s): PROBNP in the last 8760 hours.  Lipid Profile: No results for input(s): CHOL, HDL, LDLCALC, TRIG, CHOLHDL, LDLDIRECT in the last 72 hours.  Thyroid Function Tests: No results for input(s): TSH, T4TOTAL, FREET4, T3FREE, THYROIDAB in the last 72 hours.  Anemia Panel: No results for input(s): VITAMINB12, FOLATE, FERRITIN, TIBC, IRON, RETICCTPCT in the last 72 hours.  Urine analysis:    Component Value Date/Time   COLORURINE AMBER (A) 09/12/2019 1057   APPEARANCEUR HAZY (A) 09/12/2019 1057   LABSPEC 1.030 09/12/2019 1057   PHURINE 5.0  09/12/2019 1057   GLUCOSEU NEGATIVE 09/12/2019 1057   HGBUR NEGATIVE 09/12/2019 1057   BILIRUBINUR NEGATIVE 09/12/2019 1057   KETONESUR NEGATIVE 09/12/2019 1057   PROTEINUR 30 (A) 09/12/2019 1057   NITRITE NEGATIVE 09/12/2019 1057   LEUKOCYTESUR NEGATIVE 09/12/2019 1057    Sepsis Labs: Lactic Acid, Venous    Component Value Date/Time   LATICACIDVEN 2.6 (HH) 06/10/2019 1345    MICROBIOLOGY: Recent Results (from the past 240 hour(s))  Culture, blood (single)     Status: None (Preliminary result)   Collection Time: 09/12/19 10:50 AM   Specimen: BLOOD RIGHT WRIST  Result Value Ref Range Status   Specimen Description BLOOD RIGHT WRIST  Final   Special Requests   Final    BOTTLES DRAWN AEROBIC AND ANAEROBIC Blood Culture adequate volume   Culture   Final    NO GROWTH 1 DAY Performed at [facility], [place] [address], [place], [place] [address]    Report Status PENDING  Incomplete  SARS CORONAVIRUS 2 (TAT 6-24 HRS) Nasopharyngeal Nasopharyngeal Swab     Status: None   Collection Time: 09/12/19 11:22 AM   Specimen: Nasopharyngeal Swab  Result Value Ref Range Status   SARS Coronavirus 2 NEGATIVE NEGATIVE Final    Comment: (NOTE) SARS-CoV-2 target nucleic acids are NOT DETECTED. The SARS-CoV-2 RNA is generally detectable in upper and lower respiratory specimens during the acute phase of infection. Negative results do not preclude SARS-CoV-2 infection, do not rule out co-infections with other pathogens, and should not be used as the sole basis for treatment or other patient management decisions. Negative results must be combined with clinical observations, patient history, and epidemiological information. The expected result is Negative. Fact Sheet for Patients: SugarRoll.be Fact Sheet for Healthcare Providers: https://www.woods-mathews.com/ This test is not yet approved or cleared by the [place] FDA and  has been authorized  for detection and/or diagnosis of SARS-CoV-2 by FDA under an Emergency Use Authorization (EUA). This EUA will remain  in effect (meaning this test can be used) for the duration of the COVID-19 declaration under Section 56 4(b)(1) of the Act, 21 U.S.C. section 360bbb-3(b)(1), unless the authorization is terminated or revoked sooner. Performed at [facility], [place] [address], [place], [place] [address]     RADIOLOGY STUDIES/RESULTS: DG Chest 2 View  Result Date: 09/11/2019 CLINICAL DATA:  54 year old male with chest pain and shortness of breath. EXAM: CHEST - 2 VIEW COMPARISON:  Chest radiograph dated 06/19/2019. FINDINGS: There is shallow inspiration with bibasilar atelectasis. No focal consolidation, pleural effusion, or pneumothorax. Stable cardiac silhouette. No acute osseous pathology. IMPRESSION: No acute cardiopulmonary process. Electronically Signed   By: Anner Crete M.D.   On: 09/11/2019 17:57   MR HIP LEFT W WO CONTRAST  Result Date: 09/13/2019 CLINICAL DATA:  Severe left hip pain. Follow-up gluteal abscess. EXAM: MRI OF THE LEFT HIP WITHOUT AND WITH CONTRAST TECHNIQUE: Multiplanar, multisequence MR imaging was performed both before and after administration of intravenous contrast. CONTRAST:  64m GADAVIST GADOBUTROL 1 MMOL/ML IV SOLN COMPARISON:  CT scan 09/12/2019 and prior MRI 07/10/2019 and 06/13/2019 FINDINGS: Examination is quite limited due to body habitus and patient motion. As demonstrated on the CT scan there is a large thick walled abscess in the left gluteal area in the gluteus maximus muscle laterally. This measures approximately 19 x 5.5 cm and contains gas and demonstrates moderate rim enhancement. There is also surrounding inflammatory changes with significant myofasciitis involving the left gluteal muscles and to a lesser extent the left hip musculature. No definite findings for other abscesses. No findings suspicious for septic arthritis involving the left hip or  osteomyelitis involving the left femur or left hemipelvis. Mild myositis involving the right hip and pelvic muscles. The pubic symphysis and SI joints are intact. No findings for septic arthritis. Diffuse subcutaneous soft tissue swelling/edema/fluid consistent with cellulitis. Mildly enlarged inguinal lymph nodes are likely inflammatory/hyperplastic. Large volume pelvic ascites related to patient's known cirrhosis. IMPRESSION: 1. 19 x 5.5 cm thick walled abscess in the left gluteus maximus muscle laterally. 2. Significant surrounding myofasciitis. 3. No findings for septic arthritis involving the left hip or left hemipelvis. 4. Large volume pelvic ascites related to patient's known cirrhosis. 5. Mild myositis involving the right hip and pelvic muscles. Electronically Signed   By: PMarijo SanesM.D.   On: 09/13/2019 10:20   CT ABDOMEN PELVIS W CONTRAST  Result Date: 09/12/2019 CLINICAL DATA:  Abdominal distension, ascites EXAM: CT ABDOMEN AND PELVIS WITH CONTRAST TECHNIQUE: Multidetector CT imaging of the abdomen and pelvis was performed using the standard protocol following bolus administration of intravenous contrast. CONTRAST:  1013mOMNIPAQUE IOHEXOL 300 MG/ML  SOLN COMPARISON:  CT 06/20/2019 FINDINGS: Lower chest: Small left and trace right pleural effusions with associated compressive atelectasis. Normal heart size. No pericardial effusion. Hepatobiliary: Shrunken, nodular appearance of the liver suggesting cirrhosis. No focal liver lesion identified. Prior cholecystectomy. Pancreas: Appears unremarkable without pancreatic ductal dilatation. No definite peripancreatic inflammatory changes although fluid from adjacent ascites slightly limits the evaluation. Spleen: Splenomegaly. Spleen measures 20 cm in length. Adrenals/Urinary Tract: Unremarkable adrenal glands. 1.6 cm lower pole right renal cyst. Kidneys appear otherwise within normal limits. No renal calculi. No hydronephrosis. Nondilated ureters. Urinary  bladder appears unremarkable. Stomach/Bowel: Stomach is within normal limits. Appendix not clearly visualized. Scattered colonic diverticulosis. No evidence of bowel wall thickening, distention, or inflammatory changes. Vascular/Lymphatic: Abdominal aorta is nonaneurysmal. Recanalization of the umbilical vein. Multiple upper abdominal varices suggesting portal hypertension. Portal vein appears patent. Small upper abdominal lymph nodes including within the porta hepatis. Reproductive: Prostate is unremarkable. Other: Moderate to large volume ascites. No pneumoperitoneum. Musculoskeletal: Rim enhancing fluid and air collection within the left gluteal soft tissues at site of previously seen gluteal abscess. Approximate measurements of the collection are 6.8 x 4.1 cm trans-axially by 16.7 cm craniocaudally (series 3, image 106; series 10, image 156). Extensive surrounding subcutaneous edema overlying the left hip and proximal thigh as well as the bilateral flanks. No acute osseous abnormality. No areas of cortical destruction to suggest osteomyelitis. IMPRESSION: 1. Rim-enhancing fluid and air collection within the left gluteal soft tissues at site of previously seen gluteal abscess. Approximate measurements of the collection are 6.8 x 4.1 x 16.7 cm. No areas of underlying cortical destruction to suggest osteomyelitis. 2. Cirrhosis  with stigmata of portal hypertension including splenomegaly and moderate to large volume ascites. 3. Small left and trace right pleural effusions. These results were called by telephone at the time of interpretation on 09/12/2019 at 10:18 am to provider [facility] , who verbally acknowledged these results. Electronically Signed   By: Davina Poke D.O.   On: 09/12/2019 10:19   IR US Guide Bx Asp/Drain  Result Date: 09/13/2019 INDICATION: New diagnosis of cirrhosis, now with symptomatic intra-abdominal ascites. Additionally, patient with history of superinfected hematoma previously  requiring image guided drainage catheter placement on 06/17/2019 with multiple subsequent operative debridement. Preceding CT scan demonstrated a recurrent fluid collection about the left lateral thigh and as such request made for image guided aspiration for diagnostic purposes. EXAM: 1. ULTRASOUND-GUIDED PARACENTESIS 2. ULTRASOUND-GUIDED ASPIRATION OF INDETERMINATE RECURRENT FLUID COLLECTION WITHIN THE SUPEROLATERAL ASPECT OF THE LEFT THIGH. COMPARISON:  CT abdomen and pelvis-10/02/2019; image guided left lateral thigh percutaneous drainage catheter placement-06/17/2019 MEDICATIONS: None. COMPLICATIONS: None immediate. TECHNIQUE: Informed written consent was obtained from the patient after a discussion of the risks, benefits and alternatives to treatment. A timeout was performed prior to the initiation of the procedure. Initial ultrasound scanning demonstrates a moderate amount of ascites within the left lower abdomen which was subsequently prepped and draped in the usual sterile fashion. 1% lidocaine with epinephrine was used for local anesthesia. An ultrasound image was saved for documentation purposed. An 8 Fr Safe-T-Centesis catheter was introduced. The paracentesis was performed ultimately yielding 9 L of serous ascitic fluid. A representative sample was capped and sent to the laboratory for analysis. The catheter was removed. _________________________________________________________ Attention was now paid towards ultrasound-guided aspiration of recurrent indeterminate fluid collection within the subcutaneous tissues about the anterior superolateral aspect the left thigh. Sonographic evaluation demonstrates a at least 15.1 x 5.9 cm minimally complex fluid collection within the subcutaneous tissues about the superolateral aspect of the left thigh, subjacent to overlying surgical incision. The skin overlying the operative site with prepped and draped in usual sterile fashion. 1% lidocaine with epinephrine was  utilized for local anesthesia. Under direct ultrasound guidance, an 18 gauge trocar needle was advanced into a cystic component of the complex fluid collection and a short Amplatz wire was coiled within the collection. Multiple image was saved procedural documentation purposes Next, the 18 gauge trocar needle was exchanged for a 15 cm Yueh sheath catheter which was utilized to aspirate approximately 350 cc of serous non foul smelling fluid as the catheter was slowly retracted. A representative sample was capped and sent to the laboratory for analysis. Dressings were applied. The patient tolerated the above procedures well without immediate postprocedural complication. FINDINGS: A total of approximately 9 liters of serous fluid was removed. Technically successful ultrasound-guided aspiration of 350 cc of serous non foul smelling fluid from minimally complex fluid collection within the subcutaneous tissues about the superolateral aspect the left thigh, subjacent to the overlying surgical incision. Representative samples were sent separately from each collection to the laboratory for analysis. IMPRESSION: 1. Successful ultrasound-guided paracentesis yielding 9 liters of peritoneal fluid. 2. Successful ultrasound-guided aspiration of 350 cc of serous, non foul smelling fluid from minimally complex recurrent fluid collection with the subcutaneous tissues about the superolateral aspect of the left thigh, subjacent to the overlying surgical incision. 3. Representative samples were sent separately from each collection to the laboratory for analysis. Electronically Signed   By: Sandi Mariscal M.D.   On: 09/13/2019 14:24   ECHOCARDIOGRAM COMPLETE  Result Date: 09/12/2019  ECHOCARDIOGRAM REPORT   Patient Name:   PRADEEP BEAUBRUN Date of Exam: 09/12/2019 Medical Rec #:  [id number]   Height:       75.0 in Accession #:    [id number]  Weight:       370.0 lb Date of Birth:  [birth year]  BSA:          2.850 m Patient Age:    11 years     BP:           144/82 mmHg Patient Gender: M           HR:           95 bpm. Exam Location:  Inpatient Procedure: 2D Echo Indications:    acute diastolic chf 160.10  History:        Patient has prior history of Echocardiogram examinations, most                 recent 06/12/2019. Cirrhosis of liver with ascites. pleural                 effusion.  Sonographer:    Johny Chess Referring Phys: [id number] DAVID TAT  Sonographer Comments: Patient is morbidly obese. Image acquisition challenging due to respiratory motion and Image acquisition challenging due to patient body habitus. IMPRESSIONS  1. Normal LV function; trace MR; ascites noted on subcostal views.  2. Left ventricular ejection fraction, by estimation, is 60 to 65%. The left ventricle has normal function. The left ventricle has no regional wall motion abnormalities. Left ventricular diastolic parameters were normal.  3. Right ventricular systolic function is normal. The right ventricular size is normal.  4. The mitral valve is normal in structure and function. No evidence of mitral valve regurgitation. No evidence of mitral stenosis.  5. The aortic valve is tricuspid. Aortic valve regurgitation is not visualized. No aortic stenosis is present.  6. The inferior vena cava is normal in size with greater than 50% respiratory variability, suggesting right atrial pressure of 3 mmHg. FINDINGS  Left Ventricle: Left ventricular ejection fraction, by estimation, is 60 to 65%. The left ventricle has normal function. The left ventricle has no regional wall motion abnormalities. The left ventricular internal cavity size was normal in size. There is  no left ventricular hypertrophy. Left ventricular diastolic parameters were normal. Right Ventricle: The right ventricular size is normal. Right ventricular systolic function is normal. Left Atrium: Left atrial size was normal in size. Right Atrium: Right atrial size was normal in size. Pericardium: There is no evidence of  pericardial effusion. Mitral Valve: The mitral valve is normal in structure and function. Normal mobility of the mitral valve leaflets. No evidence of mitral valve regurgitation. No evidence of mitral valve stenosis. Tricuspid Valve: The tricuspid valve is normal in structure. Tricuspid valve regurgitation is trivial. No evidence of tricuspid stenosis. Aortic Valve: The aortic valve is tricuspid. Aortic valve regurgitation is not visualized. No aortic stenosis is present. Pulmonic Valve: The pulmonic valve was normal in structure. Pulmonic valve regurgitation is trivial. No evidence of pulmonic stenosis. Aorta: The aortic root is normal in size and structure. Venous: The inferior vena cava is normal in size with greater than 50% respiratory variability, suggesting right atrial pressure of 3 mmHg. IAS/Shunts: No atrial level shunt detected by color flow Doppler. Additional Comments: Normal LV function; trace MR; ascites noted on subcostal views.  LEFT VENTRICLE PLAX 2D LVIDd:         4.69 cm  Diastology LVIDs:  2.91 cm  LV e' lateral: 16.50 cm/s LV PW:         0.96 cm  LV e' medial:  10.20 cm/s LV IVS:        0.95 cm LVOT diam:     2.30 cm LV SV:         88 LV SV Index:   31 LVOT Area:     4.15 cm  LEFT ATRIUM           Index       RIGHT ATRIUM           Index LA diam:      4.60 cm 1.61 cm/m  RA Area:     12.80 cm LA Vol (A2C): 55.7 ml 19.54 ml/m RA Volume:   24.00 ml  8.42 ml/m  AORTIC VALVE LVOT Vmax:   110.00 cm/s LVOT Vmean:  69.800 cm/s LVOT VTI:    0.211 m  AORTA Ao Root diam: 3.30 cm  SHUNTS Systemic VTI:  0.21 m Systemic Diam: 2.30 cm Kirk Ruths MD Electronically signed by Kirk Ruths MD Signature Date/Time: 09/12/2019/4:38:39 PM    Final    IR Paracentesis  Result Date: 09/13/2019 INDICATION: New diagnosis of cirrhosis, now with symptomatic intra-abdominal ascites. Additionally, patient with history of superinfected hematoma previously requiring image guided drainage catheter placement on  06/17/2019 with multiple subsequent operative debridement. Preceding CT scan demonstrated a recurrent fluid collection about the left lateral thigh and as such request made for image guided aspiration for diagnostic purposes. EXAM: 1. ULTRASOUND-GUIDED PARACENTESIS 2. ULTRASOUND-GUIDED ASPIRATION OF INDETERMINATE RECURRENT FLUID COLLECTION WITHIN THE SUPEROLATERAL ASPECT OF THE LEFT THIGH. COMPARISON:  CT abdomen and pelvis-10/02/2019; image guided left lateral thigh percutaneous drainage catheter placement-06/17/2019 MEDICATIONS: None. COMPLICATIONS: None immediate. TECHNIQUE: Informed written consent was obtained from the patient after a discussion of the risks, benefits and alternatives to treatment. A timeout was performed prior to the initiation of the procedure. Initial ultrasound scanning demonstrates a moderate amount of ascites within the left lower abdomen which was subsequently prepped and draped in the usual sterile fashion. 1% lidocaine with epinephrine was used for local anesthesia. An ultrasound image was saved for documentation purposed. An 8 Fr Safe-T-Centesis catheter was introduced. The paracentesis was performed ultimately yielding 9 L of serous ascitic fluid. A representative sample was capped and sent to the laboratory for analysis. The catheter was removed. _________________________________________________________ Attention was now paid towards ultrasound-guided aspiration of recurrent indeterminate fluid collection within the subcutaneous tissues about the anterior superolateral aspect the left thigh. Sonographic evaluation demonstrates a at least 15.1 x 5.9 cm minimally complex fluid collection within the subcutaneous tissues about the superolateral aspect of the left thigh, subjacent to overlying surgical incision. The skin overlying the operative site with prepped and draped in usual sterile fashion. 1% lidocaine with epinephrine was utilized for local anesthesia. Under direct ultrasound  guidance, an 18 gauge trocar needle was advanced into a cystic component of the complex fluid collection and a short Amplatz wire was coiled within the collection. Multiple image was saved procedural documentation purposes Next, the 18 gauge trocar needle was exchanged for a 15 cm Yueh sheath catheter which was utilized to aspirate approximately 350 cc of serous non foul smelling fluid as the catheter was slowly retracted. A representative sample was capped and sent to the laboratory for analysis. Dressings were applied. The patient tolerated the above procedures well without immediate postprocedural complication. FINDINGS: A total of approximately 9 liters of serous fluid was removed.  Technically successful ultrasound-guided aspiration of 350 cc of serous non foul smelling fluid from minimally complex fluid collection within the subcutaneous tissues about the superolateral aspect the left thigh, subjacent to the overlying surgical incision. Representative samples were sent separately from each collection to the laboratory for analysis. IMPRESSION: 1. Successful ultrasound-guided paracentesis yielding 9 liters of peritoneal fluid. 2. Successful ultrasound-guided aspiration of 350 cc of serous, non foul smelling fluid from minimally complex recurrent fluid collection with the subcutaneous tissues about the superolateral aspect of the left thigh, subjacent to the overlying surgical incision. 3. Representative samples were sent separately from each collection to the laboratory for analysis. Electronically Signed   By: Sandi Mariscal M.D.   On: 09/13/2019 14:24     LOS: 1 day   Oren Binet, MD  [facility]    To contact the attending provider between 7A-7P or the covering provider during after hours 7P-7A, please log into the web site [email or web address] and access using universal Wing password for that web site. If you do not have the password, please call the hospital operator.  09/13/2019, 3:27  PM

## 2019-09-13 NOTE — Procedures (Signed)
Pre Procedural Dx: Symptomatic Ascites; Recurrent fluid collection along left lateral thigh. Post Procedural Dx: Same  Successful US guided large volume paracentesis. Successful US guided aspiration of recurrent fluid collection within the left lateral thigh.  Representative samples were sent separately to the laboratory as requested.  EBL: None Complications: None immediate  Ronny Bacon, MD Pager #: [phone]

## 2019-09-13 NOTE — Progress Notes (Signed)
VASCULAR LAB PRELIMINARY  PRELIMINARY  PRELIMINARY  PRELIMINARY  Bilateral lower extremity venous duplex completed.    Preliminary report:  See CV proc for preliminary results.  Elloise Roark, RVT 09/13/2019, 8:33 PM

## 2019-09-13 NOTE — Consult Note (Signed)
[facility]         Reason for Consult: recurrent gluteal abscess    Referring Physician: tat  Active Problems:   Obesity, Class III, BMI 40-49.9 (morbid obesity) ([place])   Anasarca   Cirrhosis of liver with ascites (HCC)   Abscess, gluteal, left   Bilateral pleural effusion   Thrombocytopenia (HCC)    HPI: Robert Sullivan is a 54 y.o. male    Initially admitted for MSSA sepsis with bacateremia as well as left gluteal/hip abscess -  + cx and I x d with  MSSA( R tetra) ( 12/8).he was discharged then readmitted in late December where he now had secondary infection with enterobacter. He underwent repeat I x D. And now discharged on cefepime for 4 weeks through end of January. However, he had subsequent  Left hip I x D on 07/16/19, 07/18/19 by dr duda. OR culture 1/6 - e.faecalis and finegoldia- that ID consult team was not aware of and thus patient was still kept on cefepime (which would not have covered the most recent culture -efaecalis) he finished his iv abtx then followed up with Dr Sharol Given in early February. Per notes, appeared mostly healed, hypergranulation tissue, with some serous drainage. The patient at the time also mentioned diffuse swelling of lower extremity consistent with anasarca. He was seen on 3/5 at GI office for evaluation of cirrhosis and marked edema of LE and subsequently admitted. The patient denies fever or chills, mostly marked lower extremity swelling, heaviness of legs, weaping of LE. Shortness of breath, cxr suggestive of pleural effusion. His WBC WNL, afebrile. Did have repeat Imaging - CT Abdomen Pelvis today showed:   Rim-enhancing fluid and air collection within the left gluteal soft tissues at site of previously seen gluteal abscess. Approximate measurements of the collection are 6.8 x 4.1 x 16.7 cm. No areas of underlying cortical destruction to suggest osteomyelitis.   Past Medical History:  Diagnosis Date  . Anemia   . Anxiety   .  Arthritis 05/13/2019   knees  . Depression   . Diabetes mellitus without complication ([place])   . DVT (deep venous thrombosis) ([place])   . Gallstones   . Headache   . HTN (hypertension)   . Hyperlipidemia   . Sleep apnea     Allergies:  Allergies  Allergen Reactions  . Sulfa Antibiotics Rash    Total body rash    Current antibiotics:   MEDICATIONS: . enoxaparin (LOVENOX) injection  40 mg Subcutaneous Q24H  . furosemide  40 mg Intravenous BID  . insulin aspart  0-5 Units Subcutaneous QHS  . insulin aspart  0-9 Units Subcutaneous TID WC  . insulin glargine  5 Units Subcutaneous QHS  . multivitamin with minerals  1 tablet Oral Daily  . sodium chloride flush  3 mL Intravenous Once  . sodium zirconium cyclosilicate  10 g Oral Daily  . [START ON 09/14/2019] spironolactone  100 mg Oral Daily    Social History   Tobacco Use  . Smoking status: Former Smoker    Types: Cigars    Quit date: 09/10/2017    Years since quitting: 2.0  . Smokeless tobacco: Never Used  Substance Use Topics  . Alcohol use: Not Currently  . Drug use: Not Currently    Family History  Problem Relation Age of Onset  . Hypertension Father     Review of Systems - 12 point ros is negative except what is mentioned above in hpi  OBJECTIVE: Temp:  [97.8 F (36.6 C)-98 F (36.7 C)] 97.9 F (36.6 C) (03/06 0438) Pulse Rate:  [87-101] 99 (03/06 0438) Resp:  [16-21] 16 (03/06 0438) BP: (115-144)/(52-89) 131/75 (03/06 0438) SpO2:  [94 %-100 %] 97 % (03/06 0438) Physical Exam  Constitutional: He is oriented to person, place, and time. He appears well-developed and well-nourished. No distress.  HENT:  Mouth/Throat: Oropharynx is clear and moist. No oropharyngeal exudate.  Cardiovascular: Normal rate, regular rhythm and normal heart sounds. Exam reveals no gallop and no friction rub.  No murmur heard.  Pulmonary/Chest: Effort normal and breath sounds normal. No respiratory distress. He has no wheezes.    Abdominal: Soft. Protuberant, Bowel sounds are normal. He exhibits no distension. There is no tenderness.  Lymphadenopathy:  He has no cervical adenopathy.  Neurological: He is alert and oriented to person, place, and time.  MPN:TIRWERX edema +4. Blanching erythema, equal to lower extremities Skin: Skin is warm and dry. No rash noted. No erythema.  Psychiatric: He has a normal mood and affect. His behavior is normal.    LABS: Results for orders placed or performed during the hospital encounter of 09/11/19 (from the past 48 hour(s))  Basic metabolic panel     Status: Abnormal   Collection Time: 09/11/19  5:02 PM  Result Value Ref Range   Sodium 140 135 - 145 mmol/L   Potassium 4.0 3.5 - 5.1 mmol/L   Chloride 109 98 - 111 mmol/L   CO2 23 22 - 32 mmol/L   Glucose, Bld 125 (H) 70 - 99 mg/dL    Comment: Glucose reference range applies only to samples taken after fasting for at least 8 hours.   BUN 14 6 - 20 mg/dL   Creatinine, Ser 0.77 0.61 - 1.24 mg/dL   Calcium 8.6 (L) 8.9 - 10.3 mg/dL   GFR calc non Af Amer >60 >60 mL/min   GFR calc Af Amer >60 >60 mL/min   Anion gap 8 5 - 15    Comment: Performed at [facility] [address], [place], [place] [address]  CBC     Status: Abnormal   Collection Time: 09/11/19  5:02 PM  Result Value Ref Range   WBC 5.7 4.0 - 10.5 K/uL   RBC 4.02 (L) 4.22 - 5.81 MIL/uL   Hemoglobin 10.2 (L) 13.0 - 17.0 g/dL   HCT 33.7 (L) 39.0 - 52.0 %   MCV 83.8 80.0 - 100.0 fL   MCH 25.4 (L) 26.0 - 34.0 pg   MCHC 30.3 30.0 - 36.0 g/dL   RDW 16.2 (H) 11.5 - 15.5 %   Platelets 143 (L) 150 - 400 K/uL   nRBC 0.0 0.0 - 0.2 %    Comment: Performed at [facility] [address], [place], [place] [address]  Troponin I (High Sensitivity)     Status: None   Collection Time: 09/11/19  5:02 PM  Result Value Ref Range   Troponin I (High Sensitivity) 5 <18 ng/L    Comment: (NOTE) Elevated high sensitivity troponin I (hsTnI) values and significant   changes across serial measurements may suggest ACS but many other  chronic and acute conditions are known to elevate hsTnI results.  Refer to the "Links" section for chest pain algorithms and additional  guidance. Performed at [facility], [place] [address], [place], [place] [address]   Troponin I (High Sensitivity)     Status: None   Collection Time: 09/11/19  7:54 PM  Result Value Ref Range   Troponin I (High Sensitivity) 14 <18 ng/L    Comment: (NOTE) Elevated high sensitivity troponin I (hsTnI) values and significant  changes across serial measurements may suggest ACS but many other  chronic and acute conditions are known to elevate hsTnI results.  Refer to the "Links" section for chest pain algorithms and additional  guidance. Performed at [facility], [place] [address], [place], [place] [address]   CBC with Differential     Status: Abnormal   Collection Time: 09/12/19  7:44 AM  Result Value Ref Range   WBC 3.9 (L) 4.0 - 10.5 K/uL   RBC 4.17 (L) 4.22 - 5.81 MIL/uL   Hemoglobin 10.5 (L) 13.0 - 17.0 g/dL   HCT 34.8 (L) 39.0 - 52.0 %   MCV 83.5 80.0 - 100.0 fL   MCH 25.2 (L) 26.0 - 34.0 pg   MCHC 30.2 30.0 - 36.0 g/dL   RDW 16.3 (H) 11.5 - 15.5 %   Platelets 125 (L) 150 - 400 K/uL   nRBC 0.0 0.0 - 0.2 %   Neutrophils Relative % 60 %   Neutro Abs 2.3 1.7 - 7.7 K/uL   Lymphocytes Relative 24 %   Lymphs Abs 0.9 0.7 - 4.0 K/uL   Monocytes Relative 9 %   Monocytes Absolute 0.3 0.1 - 1.0 K/uL   Eosinophils Relative 6 %   Eosinophils Absolute 0.3 0.0 - 0.5 K/uL   Basophils Relative 1 %   Basophils Absolute 0.0 0.0 - 0.1 K/uL   Immature Granulocytes 0 %   Abs Immature Granulocytes 0.01 0.00 - 0.07 K/uL    Comment: Performed at [facility], [place] [address] [place], [place] [address]  Comprehensive metabolic panel     Status: Abnormal   Collection Time: 09/12/19  7:44 AM  Result Value Ref Range   Sodium 141 135 - 145 mmol/L   Potassium 3.5 3.5 - 5.1 mmol/L    Chloride 108 98 - 111 mmol/L   CO2 24 22 - 32 mmol/L   Glucose, Bld 105 (H) 70 - 99 mg/dL    Comment: Glucose reference range applies only to samples taken after fasting for at least 8 hours.   BUN 15 6 - 20 mg/dL   Creatinine, Ser 0.72 0.61 - 1.24 mg/dL   Calcium 8.6 (L) 8.9 - 10.3 mg/dL   Total Protein 6.7 6.5 - 8.1 g/dL   Albumin 2.3 (L) 3.5 - 5.0 g/dL   AST 61 (H) 15 - 41 U/L   ALT 21 0 - 44 U/L   Alkaline Phosphatase 126 38 - 126 U/L   Total Bilirubin 1.1 0.3 - 1.2 mg/dL   GFR calc non Af Amer >60 >60 mL/min   GFR calc Af Amer >60 >60 mL/min   Anion gap 9 5 - 15    Comment: Performed at [facility] [address], [place], [place] [address]  Troponin I (High Sensitivity)     Status: None   Collection Time: 09/12/19  7:44 AM  Result Value Ref Range   Troponin I (High Sensitivity) 5 <18 ng/L    Comment: (NOTE) Elevated high sensitivity troponin I (hsTnI) values and significant  changes across serial measurements may suggest ACS but many other  chronic and acute conditions are known to elevate hsTnI results.  Refer to the "Links" section for chest pain algorithms and additional  guidance. Performed at [facility], [place] [address] [place], [place] [address]  Brain natriuretic peptide     Status: None   Collection Time: 09/12/19  7:44 AM  Result Value Ref Range   B Natriuretic Peptide 23.2 0.0 - 100.0 pg/mL    Comment: Performed at [facility] [address], [place], [place] [address]  Lipase, blood     Status: None   Collection Time: 09/12/19  7:44 AM  Result Value Ref Range   Lipase 20 11 - 51 U/L    Comment: Performed at [facility] [address] [place], [place] [address]  Urinalysis, Routine w reflex microscopic     Status: Abnormal   Collection Time: 09/12/19 10:57 AM  Result Value Ref Range   Color, Urine AMBER (A) YELLOW    Comment: BIOCHEMICALS MAY BE AFFECTED BY COLOR   APPearance HAZY (A) CLEAR   Specific Gravity, Urine  1.030 1.005 - 1.030   pH 5.0 5.0 - 8.0   Glucose, UA NEGATIVE NEGATIVE mg/dL   Hgb urine dipstick NEGATIVE NEGATIVE   Bilirubin Urine NEGATIVE NEGATIVE   Ketones, ur NEGATIVE NEGATIVE mg/dL   Protein, ur 30 (A) NEGATIVE mg/dL   Nitrite NEGATIVE NEGATIVE   Leukocytes,Ua NEGATIVE NEGATIVE   RBC / HPF 0-5 0 - 5 RBC/hpf   WBC, UA 0-5 0 - 5 WBC/hpf   Bacteria, UA RARE (A) NONE SEEN   Squamous Epithelial / LPF 0-5 0 - 5   Mucus PRESENT    Ca Oxalate Crys, UA PRESENT     Comment: Performed at [facility], [address] [place], [place] [address]  SARS CORONAVIRUS 2 (TAT 6-24 HRS) Nasopharyngeal Nasopharyngeal Swab     Status: None   Collection Time: 09/12/19 11:22 AM   Specimen: Nasopharyngeal Swab  Result Value Ref Range   SARS Coronavirus 2 NEGATIVE NEGATIVE    Comment: (NOTE) SARS-CoV-2 target nucleic acids are NOT DETECTED. The SARS-CoV-2 RNA is generally detectable in upper and lower respiratory specimens during the acute phase of infection. Negative results do not preclude SARS-CoV-2 infection, do not rule out co-infections with other pathogens, and should not be used as the sole basis for treatment or other patient management decisions. Negative results must be combined with clinical observations, patient history, and epidemiological information. The expected result is Negative. Fact Sheet for Patients: SugarRoll.be Fact Sheet for Healthcare Providers: https://www.woods-mathews.com/ This test is not yet approved or cleared by the [place] FDA and  has been authorized for detection and/or diagnosis of SARS-CoV-2 by FDA under an Emergency Use Authorization (EUA). This EUA will remain  in effect (meaning this test can be used) for the duration of the COVID-19 declaration under Section 56 4(b)(1) of the Act, 21 U.S.C. section 360bbb-3(b)(1), unless the authorization is terminated or revoked sooner. Performed at [facility], [place] [address], [place], [place] [address]   Sedimentation rate     Status: None   Collection Time: 09/12/19 11:34 AM  Result Value Ref Range   Sed Rate 15 0 - 16 mm/hr    Comment: Performed at [facility] [address], [place], [place] [address]  C-reactive protein     Status: Abnormal   Collection Time: 09/12/19 11:34 AM  Result Value Ref Range   CRP 3.4 (H) <1.0 mg/dL    Comment: Performed at [facility] [address] [place], [place] [address]  Hemoglobin A1c     Status: Abnormal   Collection Time: 09/12/19 11:34 AM  Result Value Ref  Range   Hgb A1c MFr Bld 4.6 (L) 4.8 - 5.6 %    Comment: (NOTE) Pre diabetes:          5.7%-6.4% Diabetes:              >6.4% Glycemic control for   <7.0% adults with diabetes    Mean Plasma Glucose 85.32 mg/dL    Comment: Performed at [facility] [address], [place], [place] [address]  Protein / creatinine ratio, urine     Status: None   Collection Time: 09/12/19  2:18 PM  Result Value Ref Range   Creatinine, Urine 290.34 mg/dL   Total Protein, Urine 33 mg/dL    Comment: NO NORMAL RANGE ESTABLISHED FOR THIS TEST   Protein Creatinine Ratio 0.11 0.00 - 0.15 mg/mg[Cre]    Comment: Performed at [facility] [address] [place], [place] [address]  CBG monitoring, ED     Status: None   Collection Time: 09/12/19  5:21 PM  Result Value Ref Range   Glucose-Capillary 89 70 - 99 mg/dL    Comment: Glucose reference range applies only to samples taken after fasting for at least 8 hours.  Glucose, capillary     Status: Abnormal   Collection Time: 09/12/19  9:55 PM  Result Value Ref Range   Glucose-Capillary 105 (H) 70 - 99 mg/dL    Comment: Glucose reference range applies only to samples taken after fasting for at least 8 hours.  Basic metabolic panel     Status: Abnormal   Collection Time: 09/13/19  5:13 AM  Result Value Ref Range   Sodium 140 135 - 145 mmol/L   Potassium 5.6 (H) 3.5 - 5.1 mmol/L     Comment: HEMOLYSIS AT THIS LEVEL MAY AFFECT RESULT   Chloride 108 98 - 111 mmol/L   CO2 18 (L) 22 - 32 mmol/L   Glucose, Bld 100 (H) 70 - 99 mg/dL    Comment: Glucose reference range applies only to samples taken after fasting for at least 8 hours.   BUN 12 6 - 20 mg/dL   Creatinine, Ser 0.66 0.61 - 1.24 mg/dL   Calcium 8.1 (L) 8.9 - 10.3 mg/dL   GFR calc non Af Amer >60 >60 mL/min   GFR calc Af Amer >60 >60 mL/min   Anion gap 14 5 - 15    Comment: Performed at [facility] [address] [place], [place] [address]  CBC     Status: Abnormal   Collection Time: 09/13/19  5:13 AM  Result Value Ref Range   WBC 4.8 4.0 - 10.5 K/uL   RBC 3.72 (L) 4.22 - 5.81 MIL/uL   Hemoglobin 9.3 (L) 13.0 - 17.0 g/dL   HCT 30.8 (L) 39.0 - 52.0 %   MCV 82.8 80.0 - 100.0 fL   MCH 25.0 (L) 26.0 - 34.0 pg   MCHC 30.2 30.0 - 36.0 g/dL   RDW 16.3 (H) 11.5 - 15.5 %   Platelets 121 (L) 150 - 400 K/uL   nRBC 0.0 0.0 - 0.2 %    Comment: Performed at [facility] [address], [place], [place] [address]  Glucose, capillary     Status: Abnormal   Collection Time: 09/13/19  6:42 AM  Result Value Ref Range   Glucose-Capillary 100 (H) 70 - 99 mg/dL    Comment: Glucose reference range applies only to samples taken after fasting for at least 8 hours.  MICRO:  IMAGING: DG Chest 2 View  Result Date: 09/11/2019 CLINICAL DATA:  54 year old male with chest pain and shortness of breath. EXAM: CHEST - 2 VIEW COMPARISON:  Chest radiograph dated 06/19/2019. FINDINGS: There is shallow inspiration with bibasilar atelectasis. No focal consolidation, pleural effusion, or pneumothorax. Stable cardiac silhouette. No acute osseous pathology. IMPRESSION: No acute cardiopulmonary process. Electronically Signed   By: Anner Crete M.D.   On: 09/11/2019 17:57   CT ABDOMEN PELVIS W CONTRAST  Result Date: 09/12/2019 CLINICAL DATA:  Abdominal distension, ascites EXAM: CT ABDOMEN AND PELVIS WITH CONTRAST  TECHNIQUE: Multidetector CT imaging of the abdomen and pelvis was performed using the standard protocol following bolus administration of intravenous contrast. CONTRAST:  164m OMNIPAQUE IOHEXOL 300 MG/ML  SOLN COMPARISON:  CT 06/20/2019 FINDINGS: Lower chest: Small left and trace right pleural effusions with associated compressive atelectasis. Normal heart size. No pericardial effusion. Hepatobiliary: Shrunken, nodular appearance of the liver suggesting cirrhosis. No focal liver lesion identified. Prior cholecystectomy. Pancreas: Appears unremarkable without pancreatic ductal dilatation. No definite peripancreatic inflammatory changes although fluid from adjacent ascites slightly limits the evaluation. Spleen: Splenomegaly. Spleen measures 20 cm in length. Adrenals/Urinary Tract: Unremarkable adrenal glands. 1.6 cm lower pole right renal cyst. Kidneys appear otherwise within normal limits. No renal calculi. No hydronephrosis. Nondilated ureters. Urinary bladder appears unremarkable. Stomach/Bowel: Stomach is within normal limits. Appendix not clearly visualized. Scattered colonic diverticulosis. No evidence of bowel wall thickening, distention, or inflammatory changes. Vascular/Lymphatic: Abdominal aorta is nonaneurysmal. Recanalization of the umbilical vein. Multiple upper abdominal varices suggesting portal hypertension. Portal vein appears patent. Small upper abdominal lymph nodes including within the porta hepatis. Reproductive: Prostate is unremarkable. Other: Moderate to large volume ascites. No pneumoperitoneum. Musculoskeletal: Rim enhancing fluid and air collection within the left gluteal soft tissues at site of previously seen gluteal abscess. Approximate measurements of the collection are 6.8 x 4.1 cm trans-axially by 16.7 cm craniocaudally (series 3, image 106; series 10, image 156). Extensive surrounding subcutaneous edema overlying the left hip and proximal thigh as well as the bilateral flanks. No  acute osseous abnormality. No areas of cortical destruction to suggest osteomyelitis. IMPRESSION: 1. Rim-enhancing fluid and air collection within the left gluteal soft tissues at site of previously seen gluteal abscess. Approximate measurements of the collection are 6.8 x 4.1 x 16.7 cm. No areas of underlying cortical destruction to suggest osteomyelitis. 2. Cirrhosis with stigmata of portal hypertension including splenomegaly and moderate to large volume ascites. 3. Small left and trace right pleural effusions. These results were called by telephone at the time of interpretation on 09/12/2019 at 10:18 am to [facility], who verbally acknowledged these results. Electronically Signed   By: NDavina PokeD.O.   On: 09/12/2019 10:19   ECHOCARDIOGRAM COMPLETE  Result Date: 09/12/2019    ECHOCARDIOGRAM REPORT   Patient Name:   Robert NARDOZZIDate of Exam: 09/12/2019 Medical Rec #:  [id number]  Height:       75.0 in Accession #:    [id number] Weight:       370.0 lb Date of Birth:  104-Jun-1967 BSA:          2.850 m Patient Age:    89years    BP:           144/82 mmHg Patient Gender: M           HR:           95 bpm. Exam Location:  Inpatient Procedure:  2D Echo Indications:    acute diastolic chf 825.00  History:        Patient has prior history of Echocardiogram examinations, most                 recent 06/12/2019. Cirrhosis of liver with ascites. pleural                 effusion.  Sonographer:    Johny Chess Referring Phys: [phone] DAVID TAT  Sonographer Comments: Patient is morbidly obese. Image acquisition challenging due to respiratory motion and Image acquisition challenging due to patient body habitus. IMPRESSIONS  1. Normal LV function; trace MR; ascites noted on subcostal views.  2. Left ventricular ejection fraction, by estimation, is 60 to 65%. The left ventricle has normal function. The left ventricle has no regional wall motion abnormalities. Left ventricular diastolic parameters were normal.   3. Right ventricular systolic function is normal. The right ventricular size is normal.  4. The mitral valve is normal in structure and function. No evidence of mitral valve regurgitation. No evidence of mitral stenosis.  5. The aortic valve is tricuspid. Aortic valve regurgitation is not visualized. No aortic stenosis is present.  6. The inferior vena cava is normal in size with greater than 50% respiratory variability, suggesting right atrial pressure of 3 mmHg. FINDINGS  Left Ventricle: Left ventricular ejection fraction, by estimation, is 60 to 65%. The left ventricle has normal function. The left ventricle has no regional wall motion abnormalities. The left ventricular internal cavity size was normal in size. There is  no left ventricular hypertrophy. Left ventricular diastolic parameters were normal. Right Ventricle: The right ventricular size is normal. Right ventricular systolic function is normal. Left Atrium: Left atrial size was normal in size. Right Atrium: Right atrial size was normal in size. Pericardium: There is no evidence of pericardial effusion. Mitral Valve: The mitral valve is normal in structure and function. Normal mobility of the mitral valve leaflets. No evidence of mitral valve regurgitation. No evidence of mitral valve stenosis. Tricuspid Valve: The tricuspid valve is normal in structure. Tricuspid valve regurgitation is trivial. No evidence of tricuspid stenosis. Aortic Valve: The aortic valve is tricuspid. Aortic valve regurgitation is not visualized. No aortic stenosis is present. Pulmonic Valve: The pulmonic valve was normal in structure. Pulmonic valve regurgitation is trivial. No evidence of pulmonic stenosis. Aorta: The aortic root is normal in size and structure. Venous: The inferior vena cava is normal in size with greater than 50% respiratory variability, suggesting right atrial pressure of 3 mmHg. IAS/Shunts: No atrial level shunt detected by color flow Doppler. Additional  Comments: Normal LV function; trace MR; ascites noted on subcostal views.  LEFT VENTRICLE PLAX 2D LVIDd:         4.69 cm  Diastology LVIDs:         2.91 cm  LV e' lateral: 16.50 cm/s LV PW:         0.96 cm  LV e' medial:  10.20 cm/s LV IVS:        0.95 cm LVOT diam:     2.30 cm LV SV:         88 LV SV Index:   31 LVOT Area:     4.15 cm  LEFT ATRIUM           Index       RIGHT ATRIUM           Index LA diam:      4.60 cm  1.61 cm/m  RA Area:     12.80 cm LA Vol (A2C): 55.7 ml 19.54 ml/m RA Volume:   24.00 ml  8.42 ml/m  AORTIC VALVE LVOT Vmax:   110.00 cm/s LVOT Vmean:  69.800 cm/s LVOT VTI:    0.211 m  AORTA Ao Root diam: 3.30 cm  SHUNTS Systemic VTI:  0.21 m Systemic Diam: 2.30 cm Kirk Ruths MD Electronically signed by Kirk Ruths MD Signature Date/Time: 09/12/2019/4:38:39 PM    Final     HISTORICAL MICRO/IMAGING reviewed Assessment/Plan:  54yo with recurrent left gluteal abscess with associated osteomyelitis. Appears to be polymicrobial based on previous cultures in December and January  - recommend drainage IR and also have Dr Sharol Given evaluate for best course.  - please send fluid for culture (aerobic, fungal, and AFB cx) - please get blood cx - once fluid sent for culture, please start amp/sub - given cirrhosis/anasarca - best course of therapy needs to be carefully evaluated since high risk for poor healing - he will likely need additiona 6 wk of iv abtx again, unfortunately, due to reaccumulation of fluid collection  Etiology of anasarca, pronounced lower extremity edema = defer to primary team for work up.  Elzie Rings [facility] [phone]

## 2019-09-13 NOTE — Progress Notes (Addendum)
Interventional Radiology Brief Note:  MR obtained overnight: 1. 19 x 5.5 cm thick walled abscess in the left gluteus maximus muscle laterally. 2. Significant surrounding myofasciitis. 3. No findings for septic arthritis involving the left hip or left hemipelvis. 4. Large volume pelvic ascites related to patient's known cirrhosis. 5. Mild myositis involving the right hip and pelvic muscles.  Patient assessed by ID this AM.  They have requested IR drainage for fluid culture.  Labs and medications reviewed.   Consent obtained for both paracentesis and hip aspiration.   Brynda Greathouse, MS RD PA-C

## 2019-09-14 DIAGNOSIS — L0231 Cutaneous abscess of buttock: Secondary | ICD-10-CM

## 2019-09-14 LAB — CBC
HCT: 26.5 % — ABNORMAL LOW (ref 39.0–52.0)
Hemoglobin: 8.2 g/dL — ABNORMAL LOW (ref 13.0–17.0)
MCH: 25.5 pg — ABNORMAL LOW (ref 26.0–34.0)
MCHC: 30.9 g/dL (ref 30.0–36.0)
MCV: 82.3 fL (ref 80.0–100.0)
Platelets: 96 10*3/uL — ABNORMAL LOW (ref 150–400)
RBC: 3.22 MIL/uL — ABNORMAL LOW (ref 4.22–5.81)
RDW: 16.4 % — ABNORMAL HIGH (ref 11.5–15.5)
WBC: 2.8 10*3/uL — ABNORMAL LOW (ref 4.0–10.5)
nRBC: 0 % (ref 0.0–0.2)

## 2019-09-14 LAB — COMPREHENSIVE METABOLIC PANEL
ALT: 15 U/L (ref 0–44)
AST: 43 U/L — ABNORMAL HIGH (ref 15–41)
Albumin: 2.3 g/dL — ABNORMAL LOW (ref 3.5–5.0)
Alkaline Phosphatase: 78 U/L (ref 38–126)
Anion gap: 10 (ref 5–15)
BUN: 7 mg/dL (ref 6–20)
CO2: 26 mmol/L (ref 22–32)
Calcium: 8.1 mg/dL — ABNORMAL LOW (ref 8.9–10.3)
Chloride: 108 mmol/L (ref 98–111)
Creatinine, Ser: 0.66 mg/dL (ref 0.61–1.24)
GFR calc Af Amer: >60 mL/min (ref >60)
GFR calc non Af Amer: >60 mL/min (ref >60)
Glucose, Bld: 97 mg/dL (ref 70–99)
Potassium: 2.9 mmol/L — ABNORMAL LOW (ref 3.5–5.1)
Sodium: 144 mmol/L (ref 135–145)
Total Bilirubin: 1 mg/dL (ref 0.3–1.2)
Total Protein: 5.2 g/dL — ABNORMAL LOW (ref 6.5–8.1)

## 2019-09-14 LAB — PROTIME-INR
INR: 1.6 — ABNORMAL HIGH (ref 0.8–1.2)
Prothrombin Time: 18.6 seconds — ABNORMAL HIGH (ref 11.4–15.2)

## 2019-09-14 LAB — CERULOPLASMIN: Ceruloplasmin: 20.2 mg/dL (ref 16.0–31.0)

## 2019-09-14 LAB — GLUCOSE, CAPILLARY
Glucose-Capillary: 82 mg/dL (ref 70–99)
Glucose-Capillary: 103 mg/dL — ABNORMAL HIGH (ref 70–99)
Glucose-Capillary: 98 mg/dL (ref 70–99)
Glucose-Capillary: 110 mg/dL — ABNORMAL HIGH (ref 70–99)

## 2019-09-14 MED ORDER — SPIRONOLACTONE 25 MG PO TABS
100.0000 mg | ORAL_TABLET | Freq: Every day | ORAL | Status: DC
  Administered 2019-09-14 – 2019-09-17 (×4): 100 mg via ORAL
  Filled 2019-09-14 (×4): qty 4

## 2019-09-14 MED ORDER — POTASSIUM CHLORIDE CRYS ER 20 MEQ PO TBCR
40.0000 meq | EXTENDED_RELEASE_TABLET | ORAL | Status: AC
  Administered 2019-09-14 (×2): 40 meq via ORAL
  Filled 2019-09-14 (×2): qty 2

## 2019-09-14 NOTE — Progress Notes (Signed)
PROGRESS NOTE        PATIENT DETAILS Name: Robert Sullivan Age: 54 y.o. Sex: male Date of Birth: [birth year] Admit Date: 09/11/2019 Admitting Physician Orson Eva, MD EPP:IRJJOACZ, Tenna Child, PA-C  Brief Narrative: Patient is a 54 y.o. male with history of Karlene Lineman cirrhosis-recent history of MSSA bacteremia-recurrent left gluteal abscess-s/p numerous readmissions/debridements-referred from GI office for evaluation of anasarca in the setting of decompensated liver cirrhosis.  Found to have rim enhancing lesion on CT abdomen on admission within the left gluteal soft tissue area.  Significant events this admit: 3/5>> admit to [facility] from GI office for evaluation of anasarca 3/6>> ultrasound guided large-volume paracentesis (9 L) by IR 3/6>> ultrasound-guided aspiration of left gluteal abscess by IR 3/5>>TTE-EF 60-65% 3/5>>Lower Ext Doppler-no DVT  Antibiotics this admit: Vancomycin:3/4>>3/6 Unasyn:3/6>>  Prior relevant admissions: 05/12/19>>05/17/19: Admit for sepsis secondary to UTI, left hip hematoma due to fall. 06/10/2019>> 06/30/2019: Admit for MSSA bacteremia, left gluteal abscess 07/09/2019>> 07/25/2019: Admit by ortho for recurrent left hip abscess-s/p debridement x3  Prior relevant Procedures: 07/18/19>> repeat irrigation and debridement of left hip 07/16/19>> left hip debridement 07/12/19>> left hip debridement 06/16/19>> TEE-no vegetation 06/17/19>> ultrasound-guided left gluteal abscess drain placement by IR 07/09/19>> I&D of left hip abscess 06/25/19>> I&D of left gluteal abscess 06/24/19>> I&D of left gluteal abscess  Microbiology: 09/13/19>>left gluteus aspiration culture:pending  09/12/19>>blood culture: Negative 07/16/19>> tissue left hip: Enterococcus faecalis, Finegoldia magna 07/09/19>>Tissue left hip: Enterobacter cloacae 06/17/19>> left gluteal abscess: MSSA 06/10/19>> blood culture: MSSA 06/10/19>> urine culture: MSSA 05/12/19>> urine culture: E.  Coli  Consults: IR ID Ortho  Subjective: No major issues overnight-spouse at bedside-acknowledges improvement in lower extremity edema.  Assessment/Plan: Anasarca/ascites/bilateral pleural effusion: Secondary to decompensated liver cirrhosis  UA without evidence of proteinuria, echo with preserved EF.  Underwent large-volume paracentesis (9 L) on 3/6.  Continue IV Lasix-restart Aldactone since potassium is better today.  Have asked nursing staff to do strict I/os and daily weights.  Lower extremity Doppler negative for DVT.    Portal hypertension seen on CT imaging: Will need EGD at some point to confirm portal hypertensive gastropathy/survey for varices.  Recurrent left gluteal abscess: S/p ultrasound-guided aspiration on 3/6-cultures pending.  After discussion with infectious disease-patient is on Unasyn.  Await cultures.   Hyperkalemia: Resolved-now hypokalemic-being repleted-restarted Aldactone.  DM-2 (A1c 4.6 on 3/5): CBG stable-we will change Lantus to daily dosing and continue SSI.  Recent Labs    09/13/19 1823 09/13/19 2017 09/14/19 0633  GLUCAP 118* 117* 82   Atypical chest pain: Resolved-troponins negative.  Thrombocytopenia: Mild-secondary to hypersplenism in the setting of liver cirrhosis.  Anemia: Probably secondary to chronic liver disease.  Follow for now.  Obesity: Estimated body mass index is 46.27 kg/m as calculated from the following:   Height as of 09/04/19: 6' 3"  (1.905 m).   Weight as of this encounter: 167.9 kg.    Diet: Diet Order            Diet Carb Modified Fluid consistency: Thin; Room service appropriate? Yes  Diet effective now               DVT Prophylaxis: Prophylactic Lovenox   Code Status: Full code   Family Communication: Long discussion with spouse at bedside.  Disposition Plan: Probably home with home health  Barriers to Discharge: Massive anarsarca needing IV diuretics, recurrent left hip/gluteus abscess-on  IV  abx   Antimicrobial agents: Anti-infectives (From admission, onward)   Start     Dose/Rate Route Frequency Ordered Stop   09/13/19 1800  Ampicillin-Sulbactam (UNASYN) 3 g in sodium chloride 0.9 % 100 mL IVPB     3 g 200 mL/hr over 30 Minutes Intravenous Every 6 hours 09/13/19 1621     09/12/19 2000  vancomycin (VANCOREADY) IVPB 1500 mg/300 mL  Status:  Discontinued     1,500 mg 150 mL/hr over 120 Minutes Intravenous Every 8 hours 09/12/19 1037 09/13/19 1629   09/12/19 1045  vancomycin (VANCOREADY) IVPB 2000 mg/400 mL     2,000 mg 200 mL/hr over 120 Minutes Intravenous  Once 09/12/19 1037 09/12/19 1354      Time spent: 35 minutes-Greater than 50% of this time was spent in counseling, explanation of diagnosis, planning of further management, and coordination of care.  MEDICATIONS: Scheduled Meds: . enoxaparin (LOVENOX) injection  40 mg Subcutaneous Q24H  . furosemide  40 mg Intravenous BID  . insulin aspart  0-5 Units Subcutaneous QHS  . insulin aspart  0-9 Units Subcutaneous TID WC  . insulin glargine  5 Units Subcutaneous QHS  . multivitamin with minerals  1 tablet Oral Daily  . sodium chloride flush  3 mL Intravenous Once  . sodium zirconium cyclosilicate  10 g Oral Daily   Continuous Infusions: . ampicillin-sulbactam (UNASYN) IV Stopped (09/14/19 0550)   PRN Meds:.acetaminophen **OR** acetaminophen, ondansetron **OR** ondansetron (ZOFRAN) IV   PHYSICAL EXAM: Vital signs: Vitals:   09/13/19 1000 09/13/19 1944 09/14/19 0347 09/14/19 0500  BP: 131/78 (!) 145/59 123/65   Pulse: 93 89 87   Resp: 20 19 18    Temp: (!) 97.3 F (36.3 C) 98 F (36.7 C) 98.1 F (36.7 C)   TempSrc: Oral Oral Oral   SpO2: 97% 99% 95%   Weight:    (!) 167.9 kg   Filed Weights   09/14/19 0500  Weight: (!) 167.9 kg   Body mass index is 46.27 kg/m.   Gen Exam:Alert awake-not in any distress HEENT:atraumatic, normocephalic Chest: B/L clear to auscultation anteriorly CVS:S1S2  regular Abdomen:soft non tender, distended but not tense-is obese. Extremities:+++ edema Neurology: Non focal Skin: no rash  I have personally reviewed following labs and imaging studies  LABORATORY DATA: CBC: Recent Labs  Lab 09/11/19 1702 09/12/19 0744 09/13/19 0513 09/14/19 0353  WBC 5.7 3.9* 4.8 2.8*  NEUTROABS  --  2.3  --   --   HGB 10.2* 10.5* 9.3* 8.2*  HCT 33.7* 34.8* 30.8* 26.5*  MCV 83.8 83.5 82.8 82.3  PLT 143* 125* 121* 96*    Basic Metabolic Panel: Recent Labs  Lab 09/11/19 1702 09/12/19 0744 09/13/19 0513 09/14/19 0353  NA 140 141 140 144  K 4.0 3.5 5.6* 2.9*  CL 109 108 108 108  CO2 23 24 18* 26  GLUCOSE 125* 105* 100* 97  BUN 14 15 12 7   CREATININE 0.77 0.72 0.66 0.66  CALCIUM 8.6* 8.6* 8.1* 8.1*    GFR: Estimated Creatinine Clearance: 180.1 mL/min (by C-G formula based on SCr of 0.66 mg/dL).  Liver Function Tests: Recent Labs  Lab 09/12/19 0744 09/14/19 0353  AST 61* 43*  ALT 21 15  ALKPHOS 126 78  BILITOT 1.1 1.0  PROT 6.7 5.2*  ALBUMIN 2.3* 2.3*   Recent Labs  Lab 09/12/19 0744  LIPASE 20   No results for input(s): AMMONIA in the last 168 hours.  Coagulation Profile: Recent Labs  Lab 09/14/19 [phone]  INR 1.6*    Cardiac Enzymes: No results for input(s): CKTOTAL, CKMB, CKMBINDEX, TROPONINI in the last 168 hours.  BNP (last 3 results) No results for input(s): PROBNP in the last 8760 hours.  Lipid Profile: No results for input(s): CHOL, HDL, LDLCALC, TRIG, CHOLHDL, LDLDIRECT in the last 72 hours.  Thyroid Function Tests: No results for input(s): TSH, T4TOTAL, FREET4, T3FREE, THYROIDAB in the last 72 hours.  Anemia Panel: No results for input(s): VITAMINB12, FOLATE, FERRITIN, TIBC, IRON, RETICCTPCT in the last 72 hours.  Urine analysis:    Component Value Date/Time   COLORURINE AMBER (A) 09/12/2019 1057   APPEARANCEUR HAZY (A) 09/12/2019 1057   LABSPEC 1.030 09/12/2019 1057   PHURINE 5.0 09/12/2019 1057    GLUCOSEU NEGATIVE 09/12/2019 1057   HGBUR NEGATIVE 09/12/2019 1057   BILIRUBINUR NEGATIVE 09/12/2019 1057   KETONESUR NEGATIVE 09/12/2019 1057   PROTEINUR 30 (A) 09/12/2019 1057   NITRITE NEGATIVE 09/12/2019 1057   LEUKOCYTESUR NEGATIVE 09/12/2019 1057    Sepsis Labs: Lactic Acid, Venous    Component Value Date/Time   LATICACIDVEN 2.6 (HH) 06/10/2019 1345    MICROBIOLOGY: Recent Results (from the past 240 hour(s))  Culture, blood (single)     Status: None (Preliminary result)   Collection Time: 09/12/19 10:50 AM   Specimen: BLOOD RIGHT WRIST  Result Value Ref Range Status   Specimen Description BLOOD RIGHT WRIST  Final   Special Requests   Final    BOTTLES DRAWN AEROBIC AND ANAEROBIC Blood Culture adequate volume   Culture   Final    NO GROWTH 1 DAY Performed at [facility], [place] [address], [place], [place] [address]    Report Status PENDING  Incomplete  SARS CORONAVIRUS 2 (TAT 6-24 HRS) Nasopharyngeal Nasopharyngeal Swab     Status: None   Collection Time: 09/12/19 11:22 AM   Specimen: Nasopharyngeal Swab  Result Value Ref Range Status   SARS Coronavirus 2 NEGATIVE NEGATIVE Final    Comment: (NOTE) SARS-CoV-2 target nucleic acids are NOT DETECTED. The SARS-CoV-2 RNA is generally detectable in upper and lower respiratory specimens during the acute phase of infection. Negative results do not preclude SARS-CoV-2 infection, do not rule out co-infections with other pathogens, and should not be used as the sole basis for treatment or other patient management decisions. Negative results must be combined with clinical observations, patient history, and epidemiological information. The expected result is Negative. Fact Sheet for Patients: SugarRoll.be Fact Sheet for Healthcare Providers: https://www.woods-mathews.com/ This test is not yet approved or cleared by the [place] FDA and  has been authorized for detection and/or  diagnosis of SARS-CoV-2 by FDA under an Emergency Use Authorization (EUA). This EUA will remain  in effect (meaning this test can be used) for the duration of the COVID-19 declaration under Section 56 4(b)(1) of the Act, 21 U.S.C. section 360bbb-3(b)(1), unless the authorization is terminated or revoked sooner. Performed at [facility], [place] [address], [place], [place] [address]   Gram stain     Status: None   Collection Time: 09/13/19 12:31 PM   Specimen: Abscess  Result Value Ref Range Status   Specimen Description ABSCESS  Final   Special Requests GLUTEAL  Final   Gram Stain   Final    CYTOSPIN SMEAR WBC PRESENT, PREDOMINANTLY PMN NO ORGANISMS SEEN Performed at [facility], [place] [address] [place], [place] [address]    Report Status 09/13/2019 FINAL  Final  Gram stain     Status:  None   Collection Time: 09/13/19 12:31 PM   Specimen: Ascitic  Result Value Ref Range Status   Specimen Description ASCITIC  Final   Special Requests NONE  Final   Gram Stain   Final    CYTOSPIN SMEAR WBC PRESENT, PREDOMINANTLY MONONUCLEAR NO ORGANISMS SEEN Performed at [facility], [address] [place],  [address]    Report Status 09/13/2019 FINAL  Final    RADIOLOGY STUDIES/RESULTS: MR HIP LEFT W WO CONTRAST  Result Date: 09/13/2019 CLINICAL DATA:  Severe left hip pain. Follow-up gluteal abscess. EXAM: MRI OF THE LEFT HIP WITHOUT AND WITH CONTRAST TECHNIQUE: Multiplanar, multisequence MR imaging was performed both before and after administration of intravenous contrast. CONTRAST:  26m GADAVIST GADOBUTROL 1 MMOL/ML IV SOLN COMPARISON:  CT scan 09/12/2019 and prior MRI 07/10/2019 and 06/13/2019 FINDINGS: Examination is quite limited due to body habitus and patient motion. As demonstrated on the CT scan there is a large thick walled abscess in the left gluteal area in the gluteus maximus muscle laterally. This measures approximately 19 x 5.5 cm and contains gas and  demonstrates moderate rim enhancement. There is also surrounding inflammatory changes with significant myofasciitis involving the left gluteal muscles and to a lesser extent the left hip musculature. No definite findings for other abscesses. No findings suspicious for septic arthritis involving the left hip or osteomyelitis involving the left femur or left hemipelvis. Mild myositis involving the right hip and pelvic muscles. The pubic symphysis and SI joints are intact. No findings for septic arthritis. Diffuse subcutaneous soft tissue swelling/edema/fluid consistent with cellulitis. Mildly enlarged inguinal lymph nodes are likely inflammatory/hyperplastic. Large volume pelvic ascites related to patient's known cirrhosis. IMPRESSION: 1. 19 x 5.5 cm thick walled abscess in the left gluteus maximus muscle laterally. 2. Significant surrounding myofasciitis. 3. No findings for septic arthritis involving the left hip or left hemipelvis. 4. Large volume pelvic ascites related to patient's known cirrhosis. 5. Mild myositis involving the right hip and pelvic muscles. Electronically Signed   By: PMarijo SanesM.D.   On: 09/13/2019 10:20   CT ABDOMEN PELVIS W CONTRAST  Result Date: 09/12/2019 CLINICAL DATA:  Abdominal distension, ascites EXAM: CT ABDOMEN AND PELVIS WITH CONTRAST TECHNIQUE: Multidetector CT imaging of the abdomen and pelvis was performed using the standard protocol following bolus administration of intravenous contrast. CONTRAST:  1086mOMNIPAQUE IOHEXOL 300 MG/ML  SOLN COMPARISON:  CT 06/20/2019 FINDINGS: Lower chest: Small left and trace right pleural effusions with associated compressive atelectasis. Normal heart size. No pericardial effusion. Hepatobiliary: Shrunken, nodular appearance of the liver suggesting cirrhosis. No focal liver lesion identified. Prior cholecystectomy. Pancreas: Appears unremarkable without pancreatic ductal dilatation. No definite peripancreatic inflammatory changes although  fluid from adjacent ascites slightly limits the evaluation. Spleen: Splenomegaly. Spleen measures 20 cm in length. Adrenals/Urinary Tract: Unremarkable adrenal glands. 1.6 cm lower pole right renal cyst. Kidneys appear otherwise within normal limits. No renal calculi. No hydronephrosis. Nondilated ureters. Urinary bladder appears unremarkable. Stomach/Bowel: Stomach is within normal limits. Appendix not clearly visualized. Scattered colonic diverticulosis. No evidence of bowel wall thickening, distention, or inflammatory changes. Vascular/Lymphatic: Abdominal aorta is nonaneurysmal. Recanalization of the umbilical vein. Multiple upper abdominal varices suggesting portal hypertension. Portal vein appears patent. Small upper abdominal lymph nodes including within the porta hepatis. Reproductive: Prostate is unremarkable. Other: Moderate to large volume ascites. No pneumoperitoneum. Musculoskeletal: Rim enhancing fluid and air collection within the left gluteal soft tissues at site of previously seen gluteal abscess. Approximate measurements of the  collection are 6.8 x 4.1 cm trans-axially by 16.7 cm craniocaudally (series 3, image 106; series 10, image 156). Extensive surrounding subcutaneous edema overlying the left hip and proximal thigh as well as the bilateral flanks. No acute osseous abnormality. No areas of cortical destruction to suggest osteomyelitis. IMPRESSION: 1. Rim-enhancing fluid and air collection within the left gluteal soft tissues at site of previously seen gluteal abscess. Approximate measurements of the collection are 6.8 x 4.1 x 16.7 cm. No areas of underlying cortical destruction to suggest osteomyelitis. 2. Cirrhosis with stigmata of portal hypertension including splenomegaly and moderate to large volume ascites. 3. Small left and trace right pleural effusions. These results were called by telephone at the time of interpretation on 09/12/2019 at 10:18 am to provider [facility] , who verbally  acknowledged these results. Electronically Signed   By: Davina Poke D.O.   On: 09/12/2019 10:19   IR US Guide Bx Asp/Drain  Result Date: 09/13/2019 INDICATION: New diagnosis of cirrhosis, now with symptomatic intra-abdominal ascites. Additionally, patient with history of superinfected hematoma previously requiring image guided drainage catheter placement on 06/17/2019 with multiple subsequent operative debridement. Preceding CT scan demonstrated a recurrent fluid collection about the left lateral thigh and as such request made for image guided aspiration for diagnostic purposes. EXAM: 1. ULTRASOUND-GUIDED PARACENTESIS 2. ULTRASOUND-GUIDED ASPIRATION OF INDETERMINATE RECURRENT FLUID COLLECTION WITHIN THE SUPEROLATERAL ASPECT OF THE LEFT THIGH. COMPARISON:  CT abdomen and pelvis-10/02/2019; image guided left lateral thigh percutaneous drainage catheter placement-06/17/2019 MEDICATIONS: None. COMPLICATIONS: None immediate. TECHNIQUE: Informed written consent was obtained from the patient after a discussion of the risks, benefits and alternatives to treatment. A timeout was performed prior to the initiation of the procedure. Initial ultrasound scanning demonstrates a moderate amount of ascites within the left lower abdomen which was subsequently prepped and draped in the usual sterile fashion. 1% lidocaine with epinephrine was used for local anesthesia. An ultrasound image was saved for documentation purposed. An 8 Fr Safe-T-Centesis catheter was introduced. The paracentesis was performed ultimately yielding 9 L of serous ascitic fluid. A representative sample was capped and sent to the laboratory for analysis. The catheter was removed. _________________________________________________________ Attention was now paid towards ultrasound-guided aspiration of recurrent indeterminate fluid collection within the subcutaneous tissues about the anterior superolateral aspect the left thigh. Sonographic evaluation  demonstrates a at least 15.1 x 5.9 cm minimally complex fluid collection within the subcutaneous tissues about the superolateral aspect of the left thigh, subjacent to overlying surgical incision. The skin overlying the operative site with prepped and draped in usual sterile fashion. 1% lidocaine with epinephrine was utilized for local anesthesia. Under direct ultrasound guidance, an 18 gauge trocar needle was advanced into a cystic component of the complex fluid collection and a short Amplatz wire was coiled within the collection. Multiple image was saved procedural documentation purposes Next, the 18 gauge trocar needle was exchanged for a 15 cm Yueh sheath catheter which was utilized to aspirate approximately 350 cc of serous non foul smelling fluid as the catheter was slowly retracted. A representative sample was capped and sent to the laboratory for analysis. Dressings were applied. The patient tolerated the above procedures well without immediate postprocedural complication. FINDINGS: A total of approximately 9 liters of serous fluid was removed. Technically successful ultrasound-guided aspiration of 350 cc of serous non foul smelling fluid from minimally complex fluid collection within the subcutaneous tissues about the superolateral aspect the left thigh, subjacent to the overlying surgical incision. Representative samples were sent separately from each  collection to the laboratory for analysis. IMPRESSION: 1. Successful ultrasound-guided paracentesis yielding 9 liters of peritoneal fluid. 2. Successful ultrasound-guided aspiration of 350 cc of serous, non foul smelling fluid from minimally complex recurrent fluid collection with the subcutaneous tissues about the superolateral aspect of the left thigh, subjacent to the overlying surgical incision. 3. Representative samples were sent separately from each collection to the laboratory for analysis. Electronically Signed   By: Sandi Mariscal M.D.   On: 09/13/2019  14:24   ECHOCARDIOGRAM COMPLETE  Result Date: 09/12/2019    ECHOCARDIOGRAM REPORT   Patient Name:   GIBRAN VESELKA Date of Exam: 09/12/2019 Medical Rec #:  [id number]   Height:       75.0 in Accession #:    [id number]  Weight:       370.0 lb Date of Birth:  05-09-1966  BSA:          2.850 m Patient Age:    4 years    BP:           144/82 mmHg Patient Gender: M           HR:           95 bpm. Exam Location:  Inpatient Procedure: 2D Echo Indications:    acute diastolic chf 734.28  History:        Patient has prior history of Echocardiogram examinations, most                 recent 06/12/2019. Cirrhosis of liver with ascites. pleural                 effusion.  Sonographer:    Johny Chess Referring Phys: [phone] DAVID TAT  Sonographer Comments: Patient is morbidly obese. Image acquisition challenging due to respiratory motion and Image acquisition challenging due to patient body habitus. IMPRESSIONS  1. Normal LV function; trace MR; ascites noted on subcostal views.  2. Left ventricular ejection fraction, by estimation, is 60 to 65%. The left ventricle has normal function. The left ventricle has no regional wall motion abnormalities. Left ventricular diastolic parameters were normal.  3. Right ventricular systolic function is normal. The right ventricular size is normal.  4. The mitral valve is normal in structure and function. No evidence of mitral valve regurgitation. No evidence of mitral stenosis.  5. The aortic valve is tricuspid. Aortic valve regurgitation is not visualized. No aortic stenosis is present.  6. The inferior vena cava is normal in size with greater than 50% respiratory variability, suggesting right atrial pressure of 3 mmHg. FINDINGS  Left Ventricle: Left ventricular ejection fraction, by estimation, is 60 to 65%. The left ventricle has normal function. The left ventricle has no regional wall motion abnormalities. The left ventricular internal cavity size was normal in size. There is  no left  ventricular hypertrophy. Left ventricular diastolic parameters were normal. Right Ventricle: The right ventricular size is normal. Right ventricular systolic function is normal. Left Atrium: Left atrial size was normal in size. Right Atrium: Right atrial size was normal in size. Pericardium: There is no evidence of pericardial effusion. Mitral Valve: The mitral valve is normal in structure and function. Normal mobility of the mitral valve leaflets. No evidence of mitral valve regurgitation. No evidence of mitral valve stenosis. Tricuspid Valve: The tricuspid valve is normal in structure. Tricuspid valve regurgitation is trivial. No evidence of tricuspid stenosis. Aortic Valve: The aortic valve is tricuspid. Aortic valve regurgitation is not visualized. No aortic stenosis is present. Pulmonic Valve:  The pulmonic valve was normal in structure. Pulmonic valve regurgitation is trivial. No evidence of pulmonic stenosis. Aorta: The aortic root is normal in size and structure. Venous: The inferior vena cava is normal in size with greater than 50% respiratory variability, suggesting right atrial pressure of 3 mmHg. IAS/Shunts: No atrial level shunt detected by color flow Doppler. Additional Comments: Normal LV function; trace MR; ascites noted on subcostal views.  LEFT VENTRICLE PLAX 2D LVIDd:         4.69 cm  Diastology LVIDs:         2.91 cm  LV e' lateral: 16.50 cm/s LV PW:         0.96 cm  LV e' medial:  10.20 cm/s LV IVS:        0.95 cm LVOT diam:     2.30 cm LV SV:         88 LV SV Index:   31 LVOT Area:     4.15 cm  LEFT ATRIUM           Index       RIGHT ATRIUM           Index LA diam:      4.60 cm 1.61 cm/m  RA Area:     12.80 cm LA Vol (A2C): 55.7 ml 19.54 ml/m RA Volume:   24.00 ml  8.42 ml/m  AORTIC VALVE LVOT Vmax:   110.00 cm/s LVOT Vmean:  69.800 cm/s LVOT VTI:    0.211 m  AORTA Ao Root diam: 3.30 cm  SHUNTS Systemic VTI:  0.21 m Systemic Diam: 2.30 cm Kirk Ruths MD Electronically signed by Kirk Ruths MD Signature Date/Time: 09/12/2019/4:38:39 PM    Final    VAS [place] LOWER EXTREMITY VENOUS (DVT)  Result Date: 09/13/2019  Lower Venous DVTStudy Indications: Edema. Other Indications: Anasarca. Risk Factors: Decompensated liver cirrhosis with anasarca. Limitations: Body habitus and interstitial edema. Comparison Study: No prior study on file Performing Technologist: Sharion Dove RVS  Examination Guidelines: A complete evaluation includes B-mode imaging, spectral Doppler, color Doppler, and power Doppler as needed of all accessible portions of each vessel. Bilateral testing is considered an integral part of a complete examination. Limited examinations for reoccurring indications may be performed as noted. The reflux portion of the exam is performed with the patient in reverse Trendelenburg.  +---------+---------------+---------+-----------+----------+-------------------+ RIGHT    CompressibilityPhasicitySpontaneityPropertiesThrombus Aging      +---------+---------------+---------+-----------+----------+-------------------+ CFV      Full           Yes      Yes                                      +---------+---------------+---------+-----------+----------+-------------------+ SFJ      Full                                                             +---------+---------------+---------+-----------+----------+-------------------+ FV Prox  Full           Yes      Yes                                      +---------+---------------+---------+-----------+----------+-------------------+  FV Mid                                                Not visualized      +---------+---------------+---------+-----------+----------+-------------------+ FV Distal                                             Not visualized      +---------+---------------+---------+-----------+----------+-------------------+ PFV      Full                                                              +---------+---------------+---------+-----------+----------+-------------------+ POP                     Yes      Yes                  patent by color and                                                       Doppler             +---------+---------------+---------+-----------+----------+-------------------+ PTV                                                   patent by color     +---------+---------------+---------+-----------+----------+-------------------+ PERO                                                  patent by color     +---------+---------------+---------+-----------+----------+-------------------+   +---------+---------------+---------+-----------+----------+-------------------+ LEFT     CompressibilityPhasicitySpontaneityPropertiesThrombus Aging      +---------+---------------+---------+-----------+----------+-------------------+ CFV      Full           Yes      Yes                                      +---------+---------------+---------+-----------+----------+-------------------+ SFJ      Full                                                             +---------+---------------+---------+-----------+----------+-------------------+ FV Prox                 Yes      Yes  patent by color and                                                       Doppler             +---------+---------------+---------+-----------+----------+-------------------+ FV Mid                  Yes      Yes                  patent by color and                                                       Doppler             +---------+---------------+---------+-----------+----------+-------------------+ FV Distal               Yes      Yes                  patent by color and                                                       Doppler             +---------+---------------+---------+-----------+----------+-------------------+  PFV                                                   Not visualized      +---------+---------------+---------+-----------+----------+-------------------+ POP      Full           Yes      Yes                                      +---------+---------------+---------+-----------+----------+-------------------+ PTV                                                   patent by color     +---------+---------------+---------+-----------+----------+-------------------+ PERO                                                  patent by color     +---------+---------------+---------+-----------+----------+-------------------+    Summary: RIGHT: - There is no evidence of deep vein thrombosis in the lower extremity. However, portions of this examination were limited- see technologist comments above.  LEFT: - There is no evidence of deep vein thrombosis in the lower extremity. However, portions of this examination were limited- see technologist comments above.  *See table(s) above for measurements and  observations.    Preliminary    IR Paracentesis  Result Date: 09/13/2019 INDICATION: New diagnosis of cirrhosis, now with symptomatic intra-abdominal ascites. Additionally, patient with history of superinfected hematoma previously requiring image guided drainage catheter placement on 06/17/2019 with multiple subsequent operative debridement. Preceding CT scan demonstrated a recurrent fluid collection about the left lateral thigh and as such request made for image guided aspiration for diagnostic purposes. EXAM: 1. ULTRASOUND-GUIDED PARACENTESIS 2. ULTRASOUND-GUIDED ASPIRATION OF INDETERMINATE RECURRENT FLUID COLLECTION WITHIN THE SUPEROLATERAL ASPECT OF THE LEFT THIGH. COMPARISON:  CT abdomen and pelvis-10/02/2019; image guided left lateral thigh percutaneous drainage catheter placement-06/17/2019 MEDICATIONS: None. COMPLICATIONS: None immediate. TECHNIQUE: Informed written consent was obtained from the  patient after a discussion of the risks, benefits and alternatives to treatment. A timeout was performed prior to the initiation of the procedure. Initial ultrasound scanning demonstrates a moderate amount of ascites within the left lower abdomen which was subsequently prepped and draped in the usual sterile fashion. 1% lidocaine with epinephrine was used for local anesthesia. An ultrasound image was saved for documentation purposed. An 8 Fr Safe-T-Centesis catheter was introduced. The paracentesis was performed ultimately yielding 9 L of serous ascitic fluid. A representative sample was capped and sent to the laboratory for analysis. The catheter was removed. _________________________________________________________ Attention was now paid towards ultrasound-guided aspiration of recurrent indeterminate fluid collection within the subcutaneous tissues about the anterior superolateral aspect the left thigh. Sonographic evaluation demonstrates a at least 15.1 x 5.9 cm minimally complex fluid collection within the subcutaneous tissues about the superolateral aspect of the left thigh, subjacent to overlying surgical incision. The skin overlying the operative site with prepped and draped in usual sterile fashion. 1% lidocaine with epinephrine was utilized for local anesthesia. Under direct ultrasound guidance, an 18 gauge trocar needle was advanced into a cystic component of the complex fluid collection and a short Amplatz wire was coiled within the collection. Multiple image was saved procedural documentation purposes Next, the 18 gauge trocar needle was exchanged for a 15 cm Yueh sheath catheter which was utilized to aspirate approximately 350 cc of serous non foul smelling fluid as the catheter was slowly retracted. A representative sample was capped and sent to the laboratory for analysis. Dressings were applied. The patient tolerated the above procedures well without immediate postprocedural complication. FINDINGS: A  total of approximately 9 liters of serous fluid was removed. Technically successful ultrasound-guided aspiration of 350 cc of serous non foul smelling fluid from minimally complex fluid collection within the subcutaneous tissues about the superolateral aspect the left thigh, subjacent to the overlying surgical incision. Representative samples were sent separately from each collection to the laboratory for analysis. IMPRESSION: 1. Successful ultrasound-guided paracentesis yielding 9 liters of peritoneal fluid. 2. Successful ultrasound-guided aspiration of 350 cc of serous, non foul smelling fluid from minimally complex recurrent fluid collection with the subcutaneous tissues about the superolateral aspect of the left thigh, subjacent to the overlying surgical incision. 3. Representative samples were sent separately from each collection to the laboratory for analysis. Electronically Signed   By: Sandi Mariscal M.D.   On: 09/13/2019 14:24     LOS: 2 days   Oren Binet, MD  [facility]    To contact the attending provider between 7A-7P or the covering provider during after hours 7P-7A, please log into the web site [email or web address] and access using universal [place] password for that web site. If you do not have the password, please call the hospital operator.  09/14/2019, 7:11 AM

## 2019-09-14 NOTE — Plan of Care (Signed)
  Problem: Education: Goal: Knowledge of General Education information will improve Description Including pain rating scale, medication(s)/side effects and non-pharmacologic comfort measures Outcome: Progressing   Problem: Health Behavior/Discharge Planning: Goal: Ability to manage health-related needs will improve Outcome: Progressing   

## 2019-09-14 NOTE — Consult Note (Signed)
ORTHOPAEDIC CONSULTATION  REQUESTING PHYSICIAN: Jonetta Osgood, MD  Chief Complaint: Rule out abscess left hip.  HPI: Robert Sullivan is a 54 y.o. male who presents with anasarca with liver failure.  Patient states he has been having some numbness and tingling in his right lower extremity which is getting better he is status post multiple surgical debridements of the left hip and thigh.  Patient states he has no pain in the left hip or thigh however the CT scan did show a large fluid collection and this was aspirated.  Past Medical History:  Diagnosis Date   Anemia    Anxiety    Arthritis 05/13/2019   knees   Depression    Diabetes mellitus without complication (HCC)    DVT (deep venous thrombosis) ([place])    Gallstones    Headache    HTN (hypertension)    Hyperlipidemia    Sleep apnea    Past Surgical History:  Procedure Laterality Date   I & D EXTREMITY Left 07/16/2019   Procedure: LEFT HIP DEBRIDEMENT;  Surgeon: Newt Minion, MD;  Location: [place];  Service: Orthopedics;  Laterality: Left;   I & D EXTREMITY Left 07/18/2019   Procedure: REPEAT IRRIGATION AND DEBRIDEMENT LEFT HIP;  Surgeon: Newt Minion, MD;  Location: [place];  Service: Orthopedics;  Laterality: Left;   INCISION AND DRAINAGE ABSCESS Left 06/23/2019   Procedure: INCISION AND DRAINAGE GLUTEAL ABSCESS;  Surgeon: Renette Butters, MD;  Location: [place];  Service: Orthopedics;  Laterality: Left;   INCISION AND DRAINAGE ABSCESS Left 07/09/2019   Procedure: INCISION AND DRAINAGE LEFT HIP/PELVIC ABSCESS, APPLICATION OF NEGATIVE PRESSURE WOUND VAC;  Surgeon: Newt Minion, MD;  Location: [place];  Service: Orthopedics;  Laterality: Left;   INCISION AND DRAINAGE HIP Left 06/25/2019   Procedure: IRRIGATION AND DEBRIDEMENT HIP;  Surgeon: Shona Needles, MD;  Location: [place];  Service: Orthopedics;  Laterality: Left;   INCISION AND DRAINAGE OF WOUND Left 07/12/2019   Procedure: LEFT HIP DEBRIDEMENT;   Surgeon: Newt Minion, MD;  Location: [place];  Service: Orthopedics;  Laterality: Left;   IR PARACENTESIS  09/13/2019   IR US GUIDE BX ASP/DRAIN  06/17/2019   IR US GUIDE BX ASP/DRAIN  09/13/2019   KNEE ARTHROSCOPY Bilateral    left x 2, 1 right   LAPAROSCOPIC CHOLECYSTECTOMY  ~ 2010   LUMBAR DISC SURGERY     NASAL SINUS SURGERY     TEE WITHOUT CARDIOVERSION N/A 06/16/2019   Procedure: TRANSESOPHAGEAL ECHOCARDIOGRAM (TEE);  Surgeon: Dorothy Spark, MD;  [facility];  Service: Cardiovascular;  Laterality: N/A;   Social History   Socioeconomic History   Marital status: Married    Spouse name: Not on file   Number of children: 5   Years of education: Not on file   Highest education level: Not on file  Occupational History   Occupation: Logistics  Tobacco Use   Smoking status: Former Smoker    Types: Cigars    Quit date: 09/10/2017    Years since quitting: 2.0   Smokeless tobacco: Never Used  Substance and Sexual Activity   Alcohol use: Not Currently   Drug use: Not Currently   Sexual activity: Not on file  Other Topics Concern   Not on file  Social History Narrative   Not on file   Social Determinants of Health   Financial Resource Strain:    Difficulty of Paying Living Expenses: Not on file  Food Insecurity:    Worried About Charity fundraiser in the Last Year: Not on file   [employer] of Food in the Last Year: Not on file  Transportation Needs:    Lack of Transportation (Medical): Not on file   Lack of Transportation (Non-Medical): Not on file  Physical Activity:    Days of Exercise per Week: Not on file   Minutes of Exercise per Session: Not on file  Stress:    Feeling of Stress : Not on file  Social Connections:    Frequency of Communication with Friends and Family: Not on file   Frequency of Social Gatherings with Friends and Family: Not on file   Attends Religious Services: Not on file   Active Member of Clubs or  Organizations: Not on file   Attends Archivist Meetings: Not on file   Marital Status: Not on file   Family History  Problem Relation Age of Onset   Hypertension Father    - negative except otherwise stated in the family history section Allergies  Allergen Reactions   Sulfa Antibiotics Rash    Total body rash   Prior to Admission medications   Medication Sig Start Date End Date Taking? Authorizing Provider  aspirin 325 MG tablet Take 1 tablet (325 mg total) by mouth daily. Patient taking differently: Take 325 mg by mouth every 6 (six) hours as needed for mild pain.  07/25/19  Yes Persons, Bevely Palmer, PA  insulin aspart (NOVOLOG) 100 UNIT/ML injection Inject 4 Units into the skin in the morning and at bedtime.    Yes [provider]  insulin glargine (LANTUS) 100 UNIT/ML injection Inject 0.24 mLs (24 Units total) into the skin daily. Patient taking differently: Inject 50 Units into the skin daily.  07/01/19  Yes Hongalgi, Lenis Dickinson, MD  blood glucose meter kit and supplies KIT Dispense based on patient and insurance preference. Use up to four times daily as directed. (FOR ICD-9 250.00, 250.01). 06/30/19   Hongalgi, Lenis Dickinson, MD  Ensure Max Protein (ENSURE MAX PROTEIN) LIQD Take 330 mLs (11 oz total) by mouth daily. Patient not taking: Reported on 09/12/2019 07/01/19   Modena Jansky, MD  Insulin Syringes, Disposable, U-100 0.5 ML MISC Use as directed 3 times daily AC. 06/30/19   Hongalgi, Lenis Dickinson, MD  Multiple Vitamin (MULTIVITAMIN WITH MINERALS) TABS tablet Take 1 tablet by mouth daily. Patient not taking: Reported on 09/12/2019 07/01/19   Modena Jansky, MD   MR HIP LEFT W WO CONTRAST  Result Date: 09/13/2019 CLINICAL DATA:  Severe left hip pain. Follow-up gluteal abscess. EXAM: MRI OF THE LEFT HIP WITHOUT AND WITH CONTRAST TECHNIQUE: Multiplanar, multisequence MR imaging was performed both before and after administration of intravenous contrast. CONTRAST:  22m  GADAVIST GADOBUTROL 1 MMOL/ML IV SOLN COMPARISON:  CT scan 09/12/2019 and prior MRI 07/10/2019 and 06/13/2019 FINDINGS: Examination is quite limited due to body habitus and patient motion. As demonstrated on the CT scan there is a large thick walled abscess in the left gluteal area in the gluteus maximus muscle laterally. This measures approximately 19 x 5.5 cm and contains gas and demonstrates moderate rim enhancement. There is also surrounding inflammatory changes with significant myofasciitis involving the left gluteal muscles and to a lesser extent the left hip musculature. No definite findings for other abscesses. No findings suspicious for septic arthritis involving the left hip or osteomyelitis involving the left femur or left hemipelvis. Mild myositis involving the right hip  and pelvic muscles. The pubic symphysis and SI joints are intact. No findings for septic arthritis. Diffuse subcutaneous soft tissue swelling/edema/fluid consistent with cellulitis. Mildly enlarged inguinal lymph nodes are likely inflammatory/hyperplastic. Large volume pelvic ascites related to patient's known cirrhosis. IMPRESSION: 1. 19 x 5.5 cm thick walled abscess in the left gluteus maximus muscle laterally. 2. Significant surrounding myofasciitis. 3. No findings for septic arthritis involving the left hip or left hemipelvis. 4. Large volume pelvic ascites related to patient's known cirrhosis. 5. Mild myositis involving the right hip and pelvic muscles. Electronically Signed   By: Marijo Sanes M.D.   On: 09/13/2019 10:20   CT ABDOMEN PELVIS W CONTRAST  Result Date: 09/12/2019 CLINICAL DATA:  Abdominal distension, ascites EXAM: CT ABDOMEN AND PELVIS WITH CONTRAST TECHNIQUE: Multidetector CT imaging of the abdomen and pelvis was performed using the standard protocol following bolus administration of intravenous contrast. CONTRAST:  174m OMNIPAQUE IOHEXOL 300 MG/ML  SOLN COMPARISON:  CT 06/20/2019 FINDINGS: Lower chest: Small left  and trace right pleural effusions with associated compressive atelectasis. Normal heart size. No pericardial effusion. Hepatobiliary: Shrunken, nodular appearance of the liver suggesting cirrhosis. No focal liver lesion identified. Prior cholecystectomy. Pancreas: Appears unremarkable without pancreatic ductal dilatation. No definite peripancreatic inflammatory changes although fluid from adjacent ascites slightly limits the evaluation. Spleen: Splenomegaly. Spleen measures 20 cm in length. Adrenals/Urinary Tract: Unremarkable adrenal glands. 1.6 cm lower pole right renal cyst. Kidneys appear otherwise within normal limits. No renal calculi. No hydronephrosis. Nondilated ureters. Urinary bladder appears unremarkable. Stomach/Bowel: Stomach is within normal limits. Appendix not clearly visualized. Scattered colonic diverticulosis. No evidence of bowel wall thickening, distention, or inflammatory changes. Vascular/Lymphatic: Abdominal aorta is nonaneurysmal. Recanalization of the umbilical vein. Multiple upper abdominal varices suggesting portal hypertension. Portal vein appears patent. Small upper abdominal lymph nodes including within the porta hepatis. Reproductive: Prostate is unremarkable. Other: Moderate to large volume ascites. No pneumoperitoneum. Musculoskeletal: Rim enhancing fluid and air collection within the left gluteal soft tissues at site of previously seen gluteal abscess. Approximate measurements of the collection are 6.8 x 4.1 cm trans-axially by 16.7 cm craniocaudally (series 3, image 106; series 10, image 156). Extensive surrounding subcutaneous edema overlying the left hip and proximal thigh as well as the bilateral flanks. No acute osseous abnormality. No areas of cortical destruction to suggest osteomyelitis. IMPRESSION: 1. Rim-enhancing fluid and air collection within the left gluteal soft tissues at site of previously seen gluteal abscess. Approximate measurements of the collection are 6.8 x  4.1 x 16.7 cm. No areas of underlying cortical destruction to suggest osteomyelitis. 2. Cirrhosis with stigmata of portal hypertension including splenomegaly and moderate to large volume ascites. 3. Small left and trace right pleural effusions. These results were called by telephone at the time of interpretation on 09/12/2019 at 10:18 am to [facility], who verbally acknowledged these results. Electronically Signed   By: NDavina PokeD.O.   On: 09/12/2019 10:19   IR [place] Bx Asp/Drain  Result Date: 09/13/2019 INDICATION: New diagnosis of cirrhosis, now with symptomatic intra-abdominal ascites. Additionally, patient with history of superinfected hematoma previously requiring image guided drainage catheter placement on 06/17/2019 with multiple subsequent operative debridement. Preceding CT scan demonstrated a recurrent fluid collection about the left lateral thigh and as such request made for image guided aspiration for diagnostic purposes. EXAM: 1. ULTRASOUND-GUIDED PARACENTESIS 2. ULTRASOUND-GUIDED ASPIRATION OF INDETERMINATE RECURRENT FLUID COLLECTION WITHIN THE SUPEROLATERAL ASPECT OF THE LEFT THIGH. COMPARISON:  CT abdomen and pelvis-10/02/2019; image guided left  lateral thigh percutaneous drainage catheter placement-06/17/2019 MEDICATIONS: None. COMPLICATIONS: None immediate. TECHNIQUE: Informed written consent was obtained from the patient after a discussion of the risks, benefits and alternatives to treatment. A timeout was performed prior to the initiation of the procedure. Initial ultrasound scanning demonstrates a moderate amount of ascites within the left lower abdomen which was subsequently prepped and draped in the usual sterile fashion. 1% lidocaine with epinephrine was used for local anesthesia. An ultrasound image was saved for documentation purposed. An 8 Fr Safe-T-Centesis catheter was introduced. The paracentesis was performed ultimately yielding 9 L of serous ascitic fluid. A  representative sample was capped and sent to the laboratory for analysis. The catheter was removed. _________________________________________________________ Attention was now paid towards ultrasound-guided aspiration of recurrent indeterminate fluid collection within the subcutaneous tissues about the anterior superolateral aspect the left thigh. Sonographic evaluation demonstrates a at least 15.1 x 5.9 cm minimally complex fluid collection within the subcutaneous tissues about the superolateral aspect of the left thigh, subjacent to overlying surgical incision. The skin overlying the operative site with prepped and draped in usual sterile fashion. 1% lidocaine with epinephrine was utilized for local anesthesia. Under direct ultrasound guidance, an 18 gauge trocar needle was advanced into a cystic component of the complex fluid collection and a short Amplatz wire was coiled within the collection. Multiple image was saved procedural documentation purposes Next, the 18 gauge trocar needle was exchanged for a 15 cm Yueh sheath catheter which was utilized to aspirate approximately 350 cc of serous non foul smelling fluid as the catheter was slowly retracted. A representative sample was capped and sent to the laboratory for analysis. Dressings were applied. The patient tolerated the above procedures well without immediate postprocedural complication. FINDINGS: A total of approximately 9 liters of serous fluid was removed. Technically successful ultrasound-guided aspiration of 350 cc of serous non foul smelling fluid from minimally complex fluid collection within the subcutaneous tissues about the superolateral aspect the left thigh, subjacent to the overlying surgical incision. Representative samples were sent separately from each collection to the laboratory for analysis. IMPRESSION: 1. Successful ultrasound-guided paracentesis yielding 9 liters of peritoneal fluid. 2. Successful ultrasound-guided aspiration of 350 cc  of serous, non foul smelling fluid from minimally complex recurrent fluid collection with the subcutaneous tissues about the superolateral aspect of the left thigh, subjacent to the overlying surgical incision. 3. Representative samples were sent separately from each collection to the laboratory for analysis. Electronically Signed   By: Sandi Mariscal M.D.   On: 09/13/2019 14:24   ECHOCARDIOGRAM COMPLETE  Result Date: 09/12/2019    ECHOCARDIOGRAM REPORT   Patient Name:   CATON POPOWSKI Date of Exam: 09/12/2019 Medical Rec #:  [id number]   Height:       75.0 in Accession #:    [id number]  Weight:       370.0 lb Date of Birth:  07-21-1965  BSA:          2.850 m Patient Age:    73 years    BP:           144/82 mmHg Patient Gender: M           HR:           95 bpm. Exam Location:  Inpatient Procedure: 2D Echo Indications:    acute diastolic chf 470.96  History:        Patient has prior history of Echocardiogram examinations, most  recent 06/12/2019. Cirrhosis of liver with ascites. pleural                 effusion.  Sonographer:    Johny Chess Referring Phys: [phone] DAVID TAT  Sonographer Comments: Patient is morbidly obese. Image acquisition challenging due to respiratory motion and Image acquisition challenging due to patient body habitus. IMPRESSIONS  1. Normal LV function; trace MR; ascites noted on subcostal views.  2. Left ventricular ejection fraction, by estimation, is 60 to 65%. The left ventricle has normal function. The left ventricle has no regional wall motion abnormalities. Left ventricular diastolic parameters were normal.  3. Right ventricular systolic function is normal. The right ventricular size is normal.  4. The mitral valve is normal in structure and function. No evidence of mitral valve regurgitation. No evidence of mitral stenosis.  5. The aortic valve is tricuspid. Aortic valve regurgitation is not visualized. No aortic stenosis is present.  6. The inferior vena cava is normal in  size with greater than 50% respiratory variability, suggesting right atrial pressure of 3 mmHg. FINDINGS  Left Ventricle: Left ventricular ejection fraction, by estimation, is 60 to 65%. The left ventricle has normal function. The left ventricle has no regional wall motion abnormalities. The left ventricular internal cavity size was normal in size. There is  no left ventricular hypertrophy. Left ventricular diastolic parameters were normal. Right Ventricle: The right ventricular size is normal. Right ventricular systolic function is normal. Left Atrium: Left atrial size was normal in size. Right Atrium: Right atrial size was normal in size. Pericardium: There is no evidence of pericardial effusion. Mitral Valve: The mitral valve is normal in structure and function. Normal mobility of the mitral valve leaflets. No evidence of mitral valve regurgitation. No evidence of mitral valve stenosis. Tricuspid Valve: The tricuspid valve is normal in structure. Tricuspid valve regurgitation is trivial. No evidence of tricuspid stenosis. Aortic Valve: The aortic valve is tricuspid. Aortic valve regurgitation is not visualized. No aortic stenosis is present. Pulmonic Valve: The pulmonic valve was normal in structure. Pulmonic valve regurgitation is trivial. No evidence of pulmonic stenosis. Aorta: The aortic root is normal in size and structure. Venous: The inferior vena cava is normal in size with greater than 50% respiratory variability, suggesting right atrial pressure of 3 mmHg. IAS/Shunts: No atrial level shunt detected by color flow Doppler. Additional Comments: Normal LV function; trace MR; ascites noted on subcostal views.  LEFT VENTRICLE PLAX 2D LVIDd:         4.69 cm  Diastology LVIDs:         2.91 cm  LV e' lateral: 16.50 cm/s LV PW:         0.96 cm  LV e' medial:  10.20 cm/s LV IVS:        0.95 cm LVOT diam:     2.30 cm LV SV:         88 LV SV Index:   31 LVOT Area:     4.15 cm  LEFT ATRIUM           Index        RIGHT ATRIUM           Index LA diam:      4.60 cm 1.61 cm/m  RA Area:     12.80 cm LA Vol (A2C): 55.7 ml 19.54 ml/m RA Volume:   24.00 ml  8.42 ml/m  AORTIC VALVE LVOT Vmax:   110.00 cm/s LVOT Vmean:  69.800 cm/s LVOT VTI:  0.211 m  AORTA Ao Root diam: 3.30 cm  SHUNTS Systemic VTI:  0.21 m Systemic Diam: 2.30 cm Kirk Ruths MD Electronically signed by Kirk Ruths MD Signature Date/Time: 09/12/2019/4:38:39 PM    Final    VAS [place] LOWER EXTREMITY VENOUS (DVT)  Result Date: 09/13/2019  Lower Venous DVTStudy Indications: Edema. Other Indications: Anasarca. Risk Factors: Decompensated liver cirrhosis with anasarca. Limitations: Body habitus and interstitial edema. Comparison Study: No prior study on file Performing Technologist: Sharion Dove RVS  Examination Guidelines: A complete evaluation includes B-mode imaging, spectral Doppler, color Doppler, and power Doppler as needed of all accessible portions of each vessel. Bilateral testing is considered an integral part of a complete examination. Limited examinations for reoccurring indications may be performed as noted. The reflux portion of the exam is performed with the patient in reverse Trendelenburg.  +---------+---------------+---------+-----------+----------+-------------------+  RIGHT     Compressibility Phasicity Spontaneity Properties Thrombus Aging       +---------+---------------+---------+-----------+----------+-------------------+  CFV       Full            Yes       Yes                                         +---------+---------------+---------+-----------+----------+-------------------+  SFJ       Full                                                                  +---------+---------------+---------+-----------+----------+-------------------+  FV Prox   Full            Yes       Yes                                         +---------+---------------+---------+-----------+----------+-------------------+  FV Mid                                                      Not visualized       +---------+---------------+---------+-----------+----------+-------------------+  FV Distal                                                  Not visualized       +---------+---------------+---------+-----------+----------+-------------------+  PFV       Full                                                                  +---------+---------------+---------+-----------+----------+-------------------+  POP  Yes       Yes                    patent by color and                                                              Doppler              +---------+---------------+---------+-----------+----------+-------------------+  PTV                                                        patent by color      +---------+---------------+---------+-----------+----------+-------------------+  PERO                                                       patent by color      +---------+---------------+---------+-----------+----------+-------------------+   +---------+---------------+---------+-----------+----------+-------------------+  LEFT      Compressibility Phasicity Spontaneity Properties Thrombus Aging       +---------+---------------+---------+-----------+----------+-------------------+  CFV       Full            Yes       Yes                                         +---------+---------------+---------+-----------+----------+-------------------+  SFJ       Full                                                                  +---------+---------------+---------+-----------+----------+-------------------+  FV Prox                   Yes       Yes                    patent by color and                                                              Doppler              +---------+---------------+---------+-----------+----------+-------------------+  FV Mid                    Yes       Yes                    patent by color and  Doppler              +---------+---------------+---------+-----------+----------+-------------------+  FV Distal                 Yes       Yes                    patent by color and                                                              Doppler              +---------+---------------+---------+-----------+----------+-------------------+  PFV                                                        Not visualized       +---------+---------------+---------+-----------+----------+-------------------+  POP       Full            Yes       Yes                                         +---------+---------------+---------+-----------+----------+-------------------+  PTV                                                        patent by color      +---------+---------------+---------+-----------+----------+-------------------+  PERO                                                       patent by color      +---------+---------------+---------+-----------+----------+-------------------+    Summary: RIGHT: - There is no evidence of deep vein thrombosis in the lower extremity. However, portions of this examination were limited- see technologist comments above.  LEFT: - There is no evidence of deep vein thrombosis in the lower extremity. However, portions of this examination were limited- see technologist comments above.  *See table(s) above for measurements and observations.    Preliminary    IR Paracentesis  Result Date: 09/13/2019 INDICATION: New diagnosis of cirrhosis, now with symptomatic intra-abdominal ascites. Additionally, patient with history of superinfected hematoma previously requiring image guided drainage catheter placement on 06/17/2019 with multiple subsequent operative debridement. Preceding CT scan demonstrated a recurrent fluid collection about the left lateral thigh and as such request made for image guided aspiration for diagnostic purposes. EXAM: 1. ULTRASOUND-GUIDED PARACENTESIS 2.  ULTRASOUND-GUIDED ASPIRATION OF INDETERMINATE RECURRENT FLUID COLLECTION WITHIN THE SUPEROLATERAL ASPECT OF THE LEFT THIGH. COMPARISON:  CT abdomen and pelvis-10/02/2019; image guided left lateral thigh percutaneous drainage catheter placement-06/17/2019 MEDICATIONS: None. COMPLICATIONS: None immediate. TECHNIQUE: Informed written consent was obtained from the patient after a discussion of the risks, benefits and alternatives to treatment.  A timeout was performed prior to the initiation of the procedure. Initial ultrasound scanning demonstrates a moderate amount of ascites within the left lower abdomen which was subsequently prepped and draped in the usual sterile fashion. 1% lidocaine with epinephrine was used for local anesthesia. An ultrasound image was saved for documentation purposed. An 8 Fr Safe-T-Centesis catheter was introduced. The paracentesis was performed ultimately yielding 9 L of serous ascitic fluid. A representative sample was capped and sent to the laboratory for analysis. The catheter was removed. _________________________________________________________ Attention was now paid towards ultrasound-guided aspiration of recurrent indeterminate fluid collection within the subcutaneous tissues about the anterior superolateral aspect the left thigh. Sonographic evaluation demonstrates a at least 15.1 x 5.9 cm minimally complex fluid collection within the subcutaneous tissues about the superolateral aspect of the left thigh, subjacent to overlying surgical incision. The skin overlying the operative site with prepped and draped in usual sterile fashion. 1% lidocaine with epinephrine was utilized for local anesthesia. Under direct ultrasound guidance, an 18 gauge trocar needle was advanced into a cystic component of the complex fluid collection and a short Amplatz wire was coiled within the collection. Multiple image was saved procedural documentation purposes Next, the 18 gauge trocar needle was exchanged  for a 15 cm Yueh sheath catheter which was utilized to aspirate approximately 350 cc of serous non foul smelling fluid as the catheter was slowly retracted. A representative sample was capped and sent to the laboratory for analysis. Dressings were applied. The patient tolerated the above procedures well without immediate postprocedural complication. FINDINGS: A total of approximately 9 liters of serous fluid was removed. Technically successful ultrasound-guided aspiration of 350 cc of serous non foul smelling fluid from minimally complex fluid collection within the subcutaneous tissues about the superolateral aspect the left thigh, subjacent to the overlying surgical incision. Representative samples were sent separately from each collection to the laboratory for analysis. IMPRESSION: 1. Successful ultrasound-guided paracentesis yielding 9 liters of peritoneal fluid. 2. Successful ultrasound-guided aspiration of 350 cc of serous, non foul smelling fluid from minimally complex recurrent fluid collection with the subcutaneous tissues about the superolateral aspect of the left thigh, subjacent to the overlying surgical incision. 3. Representative samples were sent separately from each collection to the laboratory for analysis. Electronically Signed   By: Sandi Mariscal M.D.   On: 09/13/2019 14:24   - pertinent xrays, CT, MRI studies were reviewed and independently interpreted  Positive ROS: All other systems have been reviewed and were otherwise negative with the exception of those mentioned in the HPI and as above.  Physical Exam: General: Alert, no acute distress Psychiatric: Patient is competent for consent with normal mood and affect Lymphatic: No axillary or cervical lymphadenopathy Cardiovascular: No pedal edema Respiratory: No cyanosis, no use of accessory musculature GI: No organomegaly, abdomen is soft and non-tender    Images:  @ENCIMAGES @  Labs:  Lab Results  Component Value Date   HGBA1C  4.6 (L) 09/12/2019   HGBA1C 5.5 07/10/2019   HGBA1C 11.0 (H) 05/13/2019   ESRSEDRATE 15 09/12/2019   ESRSEDRATE 12 07/11/2019   CRP 3.4 (H) 09/12/2019   CRP 4.6 (H) 07/11/2019   CRP 2.6 (H) 07/08/2019   REPTSTATUS 09/13/2019 FINAL 09/13/2019   REPTSTATUS 09/13/2019 FINAL 09/13/2019   GRAMSTAIN  09/13/2019    CYTOSPIN SMEAR WBC PRESENT, PREDOMINANTLY PMN NO ORGANISMS SEEN Performed at [facility], [place] [address] [place], [place] [address]    GRAMSTAIN  09/13/2019    CYTOSPIN SMEAR WBC  PRESENT, PREDOMINANTLY MONONUCLEAR NO ORGANISMS SEEN Performed at [facility], [place] [address], [place], [place] [address]    CULT  09/12/2019    NO GROWTH 1 DAY Performed at [place] [address], [place], [place] [address]    LABORGA ENTEROCOCCUS FAECALIS 07/16/2019    Lab Results  Component Value Date   ALBUMIN 2.3 (L) 09/14/2019   ALBUMIN 2.3 (L) 09/12/2019   ALBUMIN 1.9 (L) 07/10/2019    Neurologic: Patient does not have protective sensation bilateral lower extremities.   MUSCULOSKELETAL:   Skin: Examination there is no redness no cellulitis no drainage on the left hip or left thigh.  There is no tenderness to palpation.  Patient has no radicular symptoms.  The CT scan did show a large fluid collection.  Aspirate showing a negative Gram stain at this time.  Patient has had a history of severe protein caloric malnutrition.  Assessment: Assessment rule out recurrent abscess left hip and thigh.  Plan: Plan: I will follow the aspirate if this is positive I will repeat irrigation debridement of the left hip and thigh.  At this time patient clinically is asymptomatic and his left hip and left thigh.  Thank you for the consult and the opportunity to see Mr. Taha Dimond, [place] [phone] 8:40 AM

## 2019-09-14 NOTE — Progress Notes (Signed)
ID PROGRESS NOTE  54yo M with hx of polymicrobial gluteal abscess s/p I x D and prolonged antibiotics, now readmitted for fluid management secondary to liver disease/cirrhosis, decompensated, with anasarca, s/p repeat aspiration of gluteal fluid collection  -debate to whether it is infected vs seroma given his overall anasarca.  - he was reinitiated on antibiotics, will follow cx through 72hr to decide if he needs to be placed on antibiotics again vs. Drainage of large gluteal fluid collection  Taleen Prosser B. [place] [facility] [phone]

## 2019-09-14 NOTE — Progress Notes (Signed)
Attending Physician's Attestation   I have reviewed the chart.   I agree with the Advanced Practitioner's note, impression, and recommendations with any updates as below.  Patient not stable for outpatient management of symptoms.  Agree with concern for underlying chronic liver disease in the setting of splenomegaly and nodular appearing liver.  Diuretic adjustment will be needed and this can be coordinated in the hospital.  Hopefully he will not have other needs in regards to his previous debridement sites.  Follow-up to be dictated after evaluation in the emergency department/possible hospitalization.  Justice Britain, MD [facility]oscopy Office # [phone]

## 2019-09-15 DIAGNOSIS — L02416 Cutaneous abscess of left lower limb: Secondary | ICD-10-CM

## 2019-09-15 DIAGNOSIS — R188 Other ascites: Secondary | ICD-10-CM

## 2019-09-15 DIAGNOSIS — E8770 Fluid overload, unspecified: Secondary | ICD-10-CM

## 2019-09-15 LAB — COMPREHENSIVE METABOLIC PANEL
ALT: 14 U/L (ref 0–44)
AST: 42 U/L — ABNORMAL HIGH (ref 15–41)
Albumin: 2.1 g/dL — ABNORMAL LOW (ref 3.5–5.0)
Alkaline Phosphatase: 78 U/L (ref 38–126)
Anion gap: 8 (ref 5–15)
BUN: 5 mg/dL — ABNORMAL LOW (ref 6–20)
CO2: 27 mmol/L (ref 22–32)
Calcium: 7.9 mg/dL — ABNORMAL LOW (ref 8.9–10.3)
Chloride: 106 mmol/L (ref 98–111)
Creatinine, Ser: 0.62 mg/dL (ref 0.61–1.24)
GFR calc Af Amer: >60 mL/min (ref >60)
GFR calc non Af Amer: >60 mL/min (ref >60)
Glucose, Bld: 103 mg/dL — ABNORMAL HIGH (ref 70–99)
Potassium: 3.2 mmol/L — ABNORMAL LOW (ref 3.5–5.1)
Sodium: 141 mmol/L (ref 135–145)
Total Bilirubin: 1 mg/dL (ref 0.3–1.2)
Total Protein: 5.1 g/dL — ABNORMAL LOW (ref 6.5–8.1)

## 2019-09-15 LAB — CBC
HCT: 27.1 % — ABNORMAL LOW (ref 39.0–52.0)
Hemoglobin: 8.5 g/dL — ABNORMAL LOW (ref 13.0–17.0)
MCH: 25 pg — ABNORMAL LOW (ref 26.0–34.0)
MCHC: 31.4 g/dL (ref 30.0–36.0)
MCV: 79.7 fL — ABNORMAL LOW (ref 80.0–100.0)
Platelets: 95 10*3/uL — ABNORMAL LOW (ref 150–400)
RBC: 3.4 MIL/uL — ABNORMAL LOW (ref 4.22–5.81)
RDW: 16.4 % — ABNORMAL HIGH (ref 11.5–15.5)
WBC: 3.3 10*3/uL — ABNORMAL LOW (ref 4.0–10.5)
nRBC: 0 % (ref 0.0–0.2)

## 2019-09-15 LAB — MITOCHONDRIAL ANTIBODIES: Mitochondrial M2 Ab, IgG: <20 Units (ref 0.0–20.0)

## 2019-09-15 LAB — GLUCOSE, CAPILLARY
Glucose-Capillary: 91 mg/dL (ref 70–99)
Glucose-Capillary: 107 mg/dL — ABNORMAL HIGH (ref 70–99)
Glucose-Capillary: 97 mg/dL (ref 70–99)
Glucose-Capillary: 112 mg/dL — ABNORMAL HIGH (ref 70–99)

## 2019-09-15 LAB — FANA STAINING PATTERNS: Homogeneous Pattern: 1:80 {titer}

## 2019-09-15 LAB — ANTI-SMOOTH MUSCLE ANTIBODY, IGG: F-Actin IgG: 13 Units (ref 0–19)

## 2019-09-15 LAB — ANTINUCLEAR ANTIBODIES, IFA: ANA Ab, IFA: POSITIVE — AB

## 2019-09-15 MED ORDER — POTASSIUM CHLORIDE CRYS ER 20 MEQ PO TBCR
40.0000 meq | EXTENDED_RELEASE_TABLET | ORAL | Status: AC
  Administered 2019-09-15 (×2): 40 meq via ORAL
  Filled 2019-09-15 (×2): qty 2

## 2019-09-15 MED ORDER — PRO-STAT SUGAR FREE PO LIQD
30.0000 mL | Freq: Four times a day (QID) | ORAL | Status: DC
  Administered 2019-09-15 – 2019-09-25 (×19): 30 mL via ORAL
  Filled 2019-09-15 (×25): qty 30

## 2019-09-15 MED ORDER — POTASSIUM CHLORIDE CRYS ER 20 MEQ PO TBCR: 40.0000 meq | EXTENDED_RELEASE_TABLET | Freq: Every day | ORAL | Status: DC

## 2019-09-15 NOTE — Progress Notes (Signed)
[place] for Infectious Disease    Date of Admission:  09/11/2019   Total days of antibiotics 4           ID: Robert Sullivan is a 54 y.o. male with fluid overload associated with decompensated cirrhosis s/p high volume paracentesis plus 368m aspiration of re-accumulated left thigh abscess/fluid collection Active Problems:   Obesity, Class III, BMI 40-49.9 (morbid obesity) (HCC)   Anasarca   Cirrhosis of liver with ascites (HCC)   Abscess, gluteal, left   Bilateral pleural effusion   Thrombocytopenia (HCC)    Subjective: Afebrile. Feeling better, has been ambulating . Determined to do diet modification for diabetes and cirrhosis (new dx).  Medications:   enoxaparin (LOVENOX) injection  40 mg Subcutaneous Q24H   furosemide  40 mg Intravenous BID   insulin aspart  0-5 Units Subcutaneous QHS   insulin aspart  0-9 Units Subcutaneous TID WC   insulin glargine  5 Units Subcutaneous QHS   multivitamin with minerals  1 tablet Oral Daily   potassium chloride  40 mEq Oral Q4H   [START ON 09/16/2019] potassium chloride  40 mEq Oral Daily   sodium chloride flush  3 mL Intravenous Once   spironolactone  100 mg Oral Daily    Objective: Vital signs in last 24 hours: Temp:  [97.7 F (36.5 C)-98.2 F (36.8 C)] 98 F (36.7 C) (03/08 0816) Pulse Rate:  [89-98] 90 (03/08 0816) Resp:  [15-18] 15 (03/08 0816) BP: (113-122)/(71-75) 122/71 (03/08 0816) SpO2:  [96 %-99 %] 98 % (03/08 0816) Weight:  [163.2 kg] 163.2 kg (03/08 0500)  Physical Exam  Constitutional: He is oriented to person, place, and time. He appears well-developed and well-nourished. No distress.  HENT:  Mouth/Throat: Oropharynx is clear and moist. No oropharyngeal exudate.  Cardiovascular: Normal rate, regular rhythm and normal heart sounds. Exam reveals no gallop and no friction rub.  No murmur heard.  Pulmonary/Chest: Effort normal and breath sounds normal. No respiratory distress. He has no wheezes.    Abdominal: Soft. Protuberant.  Bowel sounds are normal. He exhibits no distension. There is no tenderness.  Ext: pitting +3 edema  , left hip incision is c/d/i, but woody edema Neurological: He is alert and oriented to person, place, and time.  Skin: Skin is warm and dry. No rash noted. No erythema.  Psychiatric: He has a normal mood and affect. His behavior is normal.     Lab Results Recent Labs    09/14/19 0353 09/15/19 0240  WBC 2.8* 3.3*  HGB 8.2* 8.5*  HCT 26.5* 27.1*  NA 144 141  K 2.9* 3.2*  CL 108 106  CO2 26 27  BUN 7 5*  CREATININE 0.66 0.62   Liver Panel Recent Labs    09/14/19 0353 09/15/19 0240  PROT 5.2* 5.1*  ALBUMIN 2.3* 2.1*  AST 43* 42*  ALT 15 14  ALKPHOS 78 78  BILITOT 1.0 1.0   Sedimentation Rate Recent Labs    09/12/19 1134  ESRSEDRATE 15   C-Reactive Protein Recent Labs    09/12/19 1134  CRP 3.4*    Microbiology: No growth todate at 48hrs Studies/Results: IR [place] Bx Asp/Drain  Result Date: 09/13/2019 INDICATION: New diagnosis of cirrhosis, now with symptomatic intra-abdominal ascites. Additionally, patient with history of superinfected hematoma previously requiring image guided drainage catheter placement on 06/17/2019 with multiple subsequent operative debridement. Preceding CT scan demonstrated a recurrent fluid collection about the left lateral thigh and as such request made for  image guided aspiration for diagnostic purposes. EXAM: 1. ULTRASOUND-GUIDED PARACENTESIS 2. ULTRASOUND-GUIDED ASPIRATION OF INDETERMINATE RECURRENT FLUID COLLECTION WITHIN THE SUPEROLATERAL ASPECT OF THE LEFT THIGH. COMPARISON:  CT abdomen and pelvis-10/02/2019; image guided left lateral thigh percutaneous drainage catheter placement-06/17/2019 MEDICATIONS: None. COMPLICATIONS: None immediate. TECHNIQUE: Informed written consent was obtained from the patient after a discussion of the risks, benefits and alternatives to treatment. A timeout was performed prior  to the initiation of the procedure. Initial ultrasound scanning demonstrates a moderate amount of ascites within the left lower abdomen which was subsequently prepped and draped in the usual sterile fashion. 1% lidocaine with epinephrine was used for local anesthesia. An ultrasound image was saved for documentation purposed. An 8 Fr Safe-T-Centesis catheter was introduced. The paracentesis was performed ultimately yielding 9 L of serous ascitic fluid. A representative sample was capped and sent to the laboratory for analysis. The catheter was removed. _________________________________________________________ Attention was now paid towards ultrasound-guided aspiration of recurrent indeterminate fluid collection within the subcutaneous tissues about the anterior superolateral aspect the left thigh. Sonographic evaluation demonstrates a at least 15.1 x 5.9 cm minimally complex fluid collection within the subcutaneous tissues about the superolateral aspect of the left thigh, subjacent to overlying surgical incision. The skin overlying the operative site with prepped and draped in usual sterile fashion. 1% lidocaine with epinephrine was utilized for local anesthesia. Under direct ultrasound guidance, an 18 gauge trocar needle was advanced into a cystic component of the complex fluid collection and a short Amplatz wire was coiled within the collection. Multiple image was saved procedural documentation purposes Next, the 18 gauge trocar needle was exchanged for a 15 cm Yueh sheath catheter which was utilized to aspirate approximately 350 cc of serous non foul smelling fluid as the catheter was slowly retracted. A representative sample was capped and sent to the laboratory for analysis. Dressings were applied. The patient tolerated the above procedures well without immediate postprocedural complication. FINDINGS: A total of approximately 9 liters of serous fluid was removed. Technically successful ultrasound-guided  aspiration of 350 cc of serous non foul smelling fluid from minimally complex fluid collection within the subcutaneous tissues about the superolateral aspect the left thigh, subjacent to the overlying surgical incision. Representative samples were sent separately from each collection to the laboratory for analysis. IMPRESSION: 1. Successful ultrasound-guided paracentesis yielding 9 liters of peritoneal fluid. 2. Successful ultrasound-guided aspiration of 350 cc of serous, non foul smelling fluid from minimally complex recurrent fluid collection with the subcutaneous tissues about the superolateral aspect of the left thigh, subjacent to the overlying surgical incision. 3. Representative samples were sent separately from each collection to the laboratory for analysis. Electronically Signed   By: Sandi Mariscal M.D.   On: 09/13/2019 14:24   VAS [place] LOWER EXTREMITY VENOUS (DVT)  Result Date: 09/14/2019  Lower Venous DVTStudy Indications: Edema. Other Indications: Anasarca. Risk Factors: Decompensated liver cirrhosis with anasarca. Limitations: Body habitus and interstitial edema. Comparison Study: No prior study on file Performing Technologist: Sharion Dove RVS  Examination Guidelines: A complete evaluation includes B-mode imaging, spectral Doppler, color Doppler, and power Doppler as needed of all accessible portions of each vessel. Bilateral testing is considered an integral part of a complete examination. Limited examinations for reoccurring indications may be performed as noted. The reflux portion of the exam is performed with the patient in reverse Trendelenburg.  +---------+---------------+---------+-----------+----------+-------------------+  RIGHT     Compressibility Phasicity Spontaneity Properties Thrombus Aging       +---------+---------------+---------+-----------+----------+-------------------+  CFV  Full            Yes       Yes                                          +---------+---------------+---------+-----------+----------+-------------------+  SFJ       Full                                                                  +---------+---------------+---------+-----------+----------+-------------------+  FV Prox   Full            Yes       Yes                                         +---------+---------------+---------+-----------+----------+-------------------+  FV Mid                                                     Not visualized       +---------+---------------+---------+-----------+----------+-------------------+  FV Distal                                                  Not visualized       +---------+---------------+---------+-----------+----------+-------------------+  PFV       Full                                                                  +---------+---------------+---------+-----------+----------+-------------------+  POP                       Yes       Yes                    patent by color and                                                              Doppler              +---------+---------------+---------+-----------+----------+-------------------+  PTV                                                        patent by color      +---------+---------------+---------+-----------+----------+-------------------+  PERO                                                       patent by color      +---------+---------------+---------+-----------+----------+-------------------+   +---------+---------------+---------+-----------+----------+-------------------+  LEFT      Compressibility Phasicity Spontaneity Properties Thrombus Aging       +---------+---------------+---------+-----------+----------+-------------------+  CFV       Full            Yes       Yes                                         +---------+---------------+---------+-----------+----------+-------------------+  SFJ       Full                                                                   +---------+---------------+---------+-----------+----------+-------------------+  FV Prox                   Yes       Yes                    patent by color and                                                              Doppler              +---------+---------------+---------+-----------+----------+-------------------+  FV Mid                    Yes       Yes                    patent by color and                                                              Doppler              +---------+---------------+---------+-----------+----------+-------------------+  FV Distal                 Yes       Yes                    patent by color and  Doppler              +---------+---------------+---------+-----------+----------+-------------------+  PFV                                                        Not visualized       +---------+---------------+---------+-----------+----------+-------------------+  POP       Full            Yes       Yes                                         +---------+---------------+---------+-----------+----------+-------------------+  PTV                                                        patent by color      +---------+---------------+---------+-----------+----------+-------------------+  PERO                                                       patent by color      +---------+---------------+---------+-----------+----------+-------------------+     Summary: RIGHT: - There is no evidence of deep vein thrombosis in the lower extremity. However, portions of this examination were limited- see technologist comments above.  LEFT: - There is no evidence of deep vein thrombosis in the lower extremity. However, portions of this examination were limited- see technologist comments above.  *See table(s) above for measurements and observations. Electronically signed by Monica Martinez MD on 09/14/2019 at 11:02:56 AM.    Final    IR  Paracentesis  Result Date: 09/13/2019 INDICATION: New diagnosis of cirrhosis, now with symptomatic intra-abdominal ascites. Additionally, patient with history of superinfected hematoma previously requiring image guided drainage catheter placement on 06/17/2019 with multiple subsequent operative debridement. Preceding CT scan demonstrated a recurrent fluid collection about the left lateral thigh and as such request made for image guided aspiration for diagnostic purposes. EXAM: 1. ULTRASOUND-GUIDED PARACENTESIS 2. ULTRASOUND-GUIDED ASPIRATION OF INDETERMINATE RECURRENT FLUID COLLECTION WITHIN THE SUPEROLATERAL ASPECT OF THE LEFT THIGH. COMPARISON:  CT abdomen and pelvis-10/02/2019; image guided left lateral thigh percutaneous drainage catheter placement-06/17/2019 MEDICATIONS: None. COMPLICATIONS: None immediate. TECHNIQUE: Informed written consent was obtained from the patient after a discussion of the risks, benefits and alternatives to treatment. A timeout was performed prior to the initiation of the procedure. Initial ultrasound scanning demonstrates a moderate amount of ascites within the left lower abdomen which was subsequently prepped and draped in the usual sterile fashion. 1% lidocaine with epinephrine was used for local anesthesia. An ultrasound image was saved for documentation purposed. An 8 Fr Safe-T-Centesis catheter was introduced. The paracentesis was performed ultimately yielding 9 L of serous ascitic fluid. A representative sample was capped and sent to the laboratory for analysis. The catheter was removed. _________________________________________________________ Attention was now paid towards ultrasound-guided aspiration of recurrent indeterminate fluid collection within the subcutaneous tissues about the anterior superolateral aspect the left thigh. Sonographic  evaluation demonstrates a at least 15.1 x 5.9 cm minimally complex fluid collection within the subcutaneous tissues about the  superolateral aspect of the left thigh, subjacent to overlying surgical incision. The skin overlying the operative site with prepped and draped in usual sterile fashion. 1% lidocaine with epinephrine was utilized for local anesthesia. Under direct ultrasound guidance, an 18 gauge trocar needle was advanced into a cystic component of the complex fluid collection and a short Amplatz wire was coiled within the collection. Multiple image was saved procedural documentation purposes Next, the 18 gauge trocar needle was exchanged for a 15 cm Yueh sheath catheter which was utilized to aspirate approximately 350 cc of serous non foul smelling fluid as the catheter was slowly retracted. A representative sample was capped and sent to the laboratory for analysis. Dressings were applied. The patient tolerated the above procedures well without immediate postprocedural complication. FINDINGS: A total of approximately 9 liters of serous fluid was removed. Technically successful ultrasound-guided aspiration of 350 cc of serous non foul smelling fluid from minimally complex fluid collection within the subcutaneous tissues about the superolateral aspect the left thigh, subjacent to the overlying surgical incision. Representative samples were sent separately from each collection to the laboratory for analysis. IMPRESSION: 1. Successful ultrasound-guided paracentesis yielding 9 liters of peritoneal fluid. 2. Successful ultrasound-guided aspiration of 350 cc of serous, non foul smelling fluid from minimally complex recurrent fluid collection with the subcutaneous tissues about the superolateral aspect of the left thigh, subjacent to the overlying surgical incision. 3. Representative samples were sent separately from each collection to the laboratory for analysis. Electronically Signed   By: Sandi Mariscal M.D.   On: 09/13/2019 14:24     Assessment/Plan: Hx of polymicrobial left thigh abscess s/p IX D completion of Iv abtx through end  of January who now presents with marked fluid overload to lower extremities and ascites. Admitted for cirrrhosis management. Imaging showed recurrence of fluid collection to left thigh possibly thought to be recurrent abscess - repeat fluid collection aspirate -- cultures are negative - he has no systemic symptoms concerning for infection - inflammatory markers are WNL - recommend to stop IV abtx and suspect this is a seroma associated with his hypo-albumin state and that there is not residual infection - we will not recommend abtx at this time. We will watch for his culture to be finalized, if positive, then will treat accordingly, but feel this would be less likely  - continue to treat cirrhosis and overall fluid balance  [facility] Cell: [phone] Pager: [phone]  09/15/2019, 11:10 AM

## 2019-09-15 NOTE — Plan of Care (Signed)
  Problem: Nutrition: Goal: Adequate nutrition will be maintained Outcome: Progressing   

## 2019-09-15 NOTE — Telephone Encounter (Signed)
Did you want pt to have refill of this ABX he is s/p a hip I&D

## 2019-09-15 NOTE — Evaluation (Signed)
Occupational Therapy Evaluation and Discharge Patient Details Name: Robert Sullivan MRN: [id number] DOB: [birth year] Today's Date: 09/15/2019    History of Present Illness 54 year old male with recent history of fall and subsequent gluteal abscess. Pt with recent hospitalization for this, has undergone IR drainage and x6 surgical I&Ds Dec 2020-Jan 2021 with excision of necrotic gluteal. Pt readmitted 3/4 with shortness of breath, fluid collection L gluteal soft tissue (pending culture), cirrhosis with ascites s/p paracentesis and L thigh aspiration 3/6. PMHx:DM2, OA, septic shock, obesity, and arthritis.   Clinical Impression   This 54 y/o male presents with the above. PTA pt reports recently using crutches and requiring intermittent assist for LB ADL tasks. Pt reports improvements since admission, reports he has been up ad lib to bathroom, completing standing grooming ADL and reports completed shower last PM without difficulty. Pt requesting to complete hallway level mobility during session, completing without AD at supervision - mod independent level. Pt continues to have difficulty reaching LLE for LB dressing, but reports plans to have family assist until this improves (vs option of AE). Questions answered throughout with no further acute OT needs identified. Pt reports feeling close to his baseline with ADL/mobility tasks. Recommend pt continue up ad lib and mobility with nursing staff. Acute OT to sign off, thank you for this referral.     Follow Up Recommendations  No OT follow up;Supervision - Intermittent    Equipment Recommendations  None recommended by OT           Precautions / Restrictions Precautions Precautions: Fall Restrictions Weight Bearing Restrictions: No      Mobility Bed Mobility Overal bed mobility: Modified Independent             General bed mobility comments: HOB slightly elevated  Transfers Overall transfer level: Modified independent Equipment used:  None                  Balance Overall balance assessment: Mild deficits observed, not formally tested;History of Falls                                         ADL either performed or assessed with clinical judgement   ADL Overall ADL's : At baseline;Needs assistance/impaired                     Lower Body Dressing: Minimal assistance;Sit to/from stand Lower Body Dressing Details (indicate cue type and reason): pt able to use figure 4 position for RLE, unable to do so with LLE given wound in L thigh, reports family can continue assisting with socks/shoes if needed (also discussed use of AE )             Functional mobility during ADLs: Supervision/safety;Modified independent General ADL Comments: pt reports he has been up ad lib to bathroom, performing standing grooming ADL and reports took a shower last evening without difficulty. He reports feeling at his baseline with ADL tasks (with the exception of LB dressing); pt did request to complete hallway level mobility during this session, completing at supervision level without AD                          Pertinent Vitals/Pain Pain Assessment: No/denies pain     Hand Dominance Right   Extremity/Trunk Assessment Upper Extremity Assessment Upper Extremity Assessment: Overall WFL for tasks assessed  Lower Extremity Assessment Lower Extremity Assessment: Defer to PT evaluation   Cervical / Trunk Assessment Cervical / Trunk Assessment: Normal   Communication Communication Communication: No difficulties   Cognition Arousal/Alertness: Awake/alert Behavior During Therapy: WFL for tasks assessed/performed Overall Cognitive Status: Within Functional Limits for tasks assessed                                     General Comments       Exercises     Shoulder Instructions      Home Living Family/patient expects to be discharged to:: Private residence Living Arrangements:  Children;Spouse/significant other Available Help at Discharge: Family;Available 24 hours/day Type of Home: House Home Access: Stairs to enter [employer] of Steps: 4 Entrance Stairs-Rails: None Home Layout: Two level;Bed/bath upstairs Alternate Level Stairs-Number of Steps: flight of stairs Alternate Level Stairs-Rails: Right Bathroom Shower/Tub: Occupational psychologist: Standard     Home Equipment: Environmental consultant - 2 wheels;Crutches          Prior Functioning/Environment Level of Independence: Independent with assistive device(s)        Comments: pt reports using crutches to negotiate stairs at home, states he ambulates without AD. Pt does require assist for L thigh dressing changes due to area being difficult to reach. Reports spouse/children have been assisting with some LB dressing tasks such as socks/shoes        OT Problem List: Impaired balance (sitting and/or standing);Decreased activity tolerance;Obesity;Cardiopulmonary status limiting activity      OT Treatment/Interventions:      OT Goals(Current goals can be found in the care plan section) Acute Rehab OT Goals Patient Stated Goal: get stronger OT Goal Formulation: All assessment and education complete, DC therapy  OT Frequency:     Barriers to D/C:            Co-evaluation              AM-PAC OT "6 Clicks" Daily Activity     Outcome Measure Help from another person eating meals?: None Help from another person taking care of personal grooming?: None Help from another person toileting, which includes using toliet, bedpan, or urinal?: None Help from another person bathing (including washing, rinsing, drying)?: None Help from another person to put on and taking off regular upper body clothing?: None   6 Click Score: 20   End of Session Nurse Communication: Mobility status  Activity Tolerance: Patient tolerated treatment well Patient left: in bed;with call bell/phone within  reach  OT Visit Diagnosis: Other abnormalities of gait and mobility (R26.89);History of falling (Z91.81)                Time: 4627-0350 OT Time Calculation (min): 18 min Charges:  OT General Charges $OT Visit: 1 Visit OT Evaluation $OT Eval Moderate Complexity: [place], OT Acute Rehabilitation Services Pager [phone] Office [phone]   Robert Sullivan 09/15/2019, 5:17 PM

## 2019-09-15 NOTE — Progress Notes (Signed)
Initial Nutrition Assessment  RD working remotely.  DOCUMENTATION CODES:   Morbid obesity  INTERVENTION:   -30 ml Prostat QID, each supplement provides 100 kcals ans 15 grams protein -Double protein portions with meals -Continue MVI with minerals daily -Attached "Cirrhosis Nutrition Therapy" handout from [facility] Nutrition Care Manual; attached to AVS/ discharge summary  NUTRITION DIAGNOSIS:   Increased nutrient needs related to chronic illness(cirrhosis) as evidenced by estimated needs.  GOAL:   Patient will meet greater than or equal to 90% of their needs  MONITOR:   PO intake, Supplement acceptance, Labs, Weight trends, Skin, I & O's  REASON FOR ASSESSMENT:   Consult Assessment of nutrition requirement/status, Diet education  ASSESSMENT:   Robert Sullivan is a 54 y.o. male with medical history of diabetes mellitus, left gluteal abscess, presenting with 1 to 75-monthhistory of increasing lower extremity edema and dyspnea on exertion.  Patient has had fairly complicated history with numerous recent hospital admissions.  Most notably, the patient was admitted to the hospital from 07/09/2019 to 07/25/2019 during which time the Patient underwent 2 separate I&D procedures.  Cultures on 07/16/2019 grew Enterococcus faecalis sensitive to amp and Finegoldia magna.  The patient was seen by infectious disease.  Per Dr. CHale Bogusnote on 07/15/2019, the patient was to finish 4 more weeks of cefepime.  At that point, the patient had had received 35 days of antibiotics.  The patient states that he has not been on any antibiotics since that period of time.  He has not had any fevers, chills, purulent drainage, or worsening lower extremity erythema.  Pt admitted with anasarca, ascites, and bilateral pleural effusions secondary to decompensated liver cirrhosis.   3/6- s/p [place] large volume paracentesis (9 L) and [place] aspiration of recurrent fluid collection of lt lateral thigh  Reviewed  I/O's: -3.4L x 24 hours and -2.3 L since admission  UOP: 3.5 L x 24 hours  Per orthopedics notes, will await results of lt thigh aspiration and plan for I&D if positive.   Attempted to speak with pt via phone, however, no answer.   Meal completion documented at 100%.   Reviewed wt hx; noted pt has experienced a 7.5% wt loss over the past 3 months. Suspect this may be related to losses related to high volume paracentesis. However, ascites may be masking true weight loss as well as fat and muscle depletion. Pt with increased nutritional needs and would benefit from addition of oral nutritional supplements.   Obesity is a complex, chronic medical condition that is optimally managed by a multidisciplinary care team. Weight loss is not an ideal goal for an acute inpatient hospitalization. However, if further work-up for obesity is warranted, consider outpatient referral to outpatient bariatric service and/or [facility].   Albumin has a half-life of 21 days and is strongly affected by stress response and inflammatory process, therefore, do not expect to see an improvement in this lab value during acute hospitalization. When a patient presents with low albumin, it is likely skewed due to the acute inflammatory response.  Unless it is suspected that patient had poor PO intake or malnutrition prior to admission, then RD should not be consulted solely for low albumin. Note that low albumin is no longer used to diagnose malnutrition; [facility] uses the new malnutrition guidelines published by the American Society for Parenteral and Enteral Nutrition (A.S.P.E.N.) and the Academy of Nutrition and Dietetics (AND).    Lab Results  Component Value Date  HGBA1C 4.6 (L) 09/12/2019   PTA DM medications are 50 units insulin glargine daily and 4 units insulin aspart BID.   Labs reviewed: CBGS: K: 3.2, CBGS: 91-110 (inpatient orders for glycemic control are 0-5 units  inuslin aspart and 0-9 units insulin aspart TID with meals).   Diet Order:   Diet Order            Diet Carb Modified Fluid consistency: Thin; Room service appropriate? Yes; Fluid restriction: 1500 mL Fluid  Diet effective now              EDUCATION NEEDS:   No education needs have been identified at this time  Skin:  Skin Assessment: Skin Integrity Issues: Skin Integrity Issues:: Other (Comment), Incisions Incisions: open lt thigh (weeping) Other: MASD skin fold between stomach and thigh  Last BM:  09/12/19  Height:   Ht Readings from Last 1 Encounters:  09/04/19 6' 3"  (1.905 m)    Weight:   Wt Readings from Last 1 Encounters:  09/15/19 (!) 163.2 kg    Ideal Body Weight:  89.1 kg  BMI:  Body mass index is 44.98 kg/m.  Estimated Nutritional Needs:   Kcal:  7014-1030  Protein:  150-175 grams  Fluid:  1.5 L    Robert Sullivan, RD, LDN, [place] Registered Dietitian II Certified Diabetes Care and Education Specialist Please refer to [facility] for RD and/or RD on-call/weekend/after hours pager

## 2019-09-15 NOTE — Plan of Care (Signed)
  Problem: Activity: Goal: Risk for activity intolerance will decrease Outcome: Progressing   Problem: Nutrition: Goal: Adequate nutrition will be maintained Outcome: Progressing   Problem: Safety: Goal: Ability to remain free from injury will improve Outcome: Progressing

## 2019-09-15 NOTE — Progress Notes (Signed)
PROGRESS NOTE        PATIENT DETAILS Name: Robert Sullivan Age: 54 y.o. Sex: male Date of Birth: [birth year] Admit Date: 09/11/2019 Admitting Physician Orson Eva, MD LGX:QJJHERDE, Tenna Child, PA-C  Brief Narrative: Patient is a 54 y.o. male with history of Karlene Lineman cirrhosis-recent history of MSSA bacteremia-recurrent left gluteal abscess-s/p numerous readmissions/debridements-referred from GI office for evaluation of anasarca in the setting of decompensated liver cirrhosis.  Found to have rim enhancing lesion on CT abdomen on admission within the left gluteal soft tissue area-concerning for an abscess-however aspiration cultures are negative so far raising question of whether this was actually a seroma.  Significant events this admit: 3/5>> admit to [facility] from GI office for evaluation of anasarca 3/6>> ultrasound guided large-volume paracentesis (9 L) by IR 3/6>> ultrasound-guided aspiration of left gluteal abscess by IR 3/5>>TTE-EF 60-65% 3/5>>Lower Ext Doppler-no DVT  Antibiotics this admit: Vancomycin:3/4>>3/6 Unasyn:3/6>> 3/8  Prior relevant admissions: 05/12/19>>05/17/19: Admit for sepsis secondary to UTI, left hip hematoma due to fall. 06/10/2019>> 06/30/2019: Admit for MSSA bacteremia, left gluteal abscess 07/09/2019>> 07/25/2019: Admit by ortho for recurrent left hip abscess-s/p debridement x3  Prior relevant Procedures: 07/18/19>> repeat irrigation and debridement of left hip 07/16/19>> left hip debridement 07/12/19>> left hip debridement 06/16/19>> TEE-no vegetation 06/17/19>> ultrasound-guided left gluteal abscess drain placement by IR 07/09/19>> I&D of left hip abscess 06/25/19>> I&D of left gluteal abscess 06/24/19>> I&D of left gluteal abscess  Microbiology: 09/13/19>>left gluteus aspiration culture: Negative 09/12/19>>blood culture: Negative 07/16/19>> tissue left hip: Enterococcus faecalis, Finegoldia magna 07/09/19>>Tissue left hip: Enterobacter  cloacae 06/17/19>> left gluteal abscess: MSSA 06/10/19>> blood culture: MSSA 06/10/19>> urine culture: MSSA 05/12/19>> urine culture: E. Coli  Consults: IR ID Ortho  Subjective: Continues to have significant amount of volume overload but improving.  Afebrile.  He has no pain in the left gluteal area.  Assessment/Plan: Anasarca/ascites/bilateral pleural effusion: Secondary to decompensated liver cirrhosis  UA without evidence of proteinuria, echo with preserved EF.  Underwent large-volume paracentesis (9 L) on 3/6.  Although volume status has improved-he still has significant amount of edema-continue Lasix/Aldactone.  Follow weights/intake/output. Lower extremity Doppler negative for DVT.    Portal hypertension seen on CT imaging: Will need EGD at some point to confirm portal hypertensive gastropathy/survey for varices.  Recurrent left gluteal abscess vs Seroma: S/p ultrasound-guided aspiration on 3/6-cultures negative so far.  Appreciate ID evaluation today-since cultures are negative-question whether CT/MRI findings were most suggestive of a seroma in the setting of anasarca.  ID recommends that we stop Unasyn-and follow cultures.  Hyperkalemia: Resolved  Hypokalemia: Replete and recheck.  Check magnesium with a.m. labs tomorrow  DM-2 (A1c 4.6 on 3/5): CBG stable-patient has refused Lantus-we will go ahead and stop Lantus and just monitor on SSI  Recent Labs    09/14/19 2022 09/15/19 0634 09/15/19 1151  GLUCAP 110* 91 107*   Atypical chest pain: Resolved-troponins negative.  Thrombocytopenia: Mild-secondary to hypersplenism in the setting of liver cirrhosis.  Anemia: Probably secondary to chronic liver disease.  Follow for now.  Obesity: Estimated body mass index is 44.98 kg/m as calculated from the following:   Height as of 09/04/19: 6' 3"  (1.905 m).   Weight as of this encounter: 163.2 kg.    Diet: Diet Order            Diet Carb Modified Fluid consistency: Thin; Room  service appropriate?  Yes; Fluid restriction: 1500 mL Fluid  Diet effective now               DVT Prophylaxis: Prophylactic Lovenox   Code Status: Full code   Family Communication: Spouse at bedside on 3/8  Disposition Plan: Probably home with home health  Barriers to Discharge: Massive anarsarca needing IV diuretics, recurrent left hip/gluteus abscess-on IV abx   Antimicrobial agents: Anti-infectives (From admission, onward)   Start     Dose/Rate Route Frequency Ordered Stop   09/13/19 1800  Ampicillin-Sulbactam (UNASYN) 3 g in sodium chloride 0.9 % 100 mL IVPB  Status:  Discontinued     3 g 200 mL/hr over 30 Minutes Intravenous Every 6 hours 09/13/19 1621 09/15/19 1101   09/12/19 2000  vancomycin (VANCOREADY) IVPB 1500 mg/300 mL  Status:  Discontinued     1,500 mg 150 mL/hr over 120 Minutes Intravenous Every 8 hours 09/12/19 1037 09/13/19 1629   09/12/19 1045  vancomycin (VANCOREADY) IVPB 2000 mg/400 mL     2,000 mg 200 mL/hr over 120 Minutes Intravenous  Once 09/12/19 1037 09/12/19 1354      Time spent: 25 minutes-Greater than 50% of this time was spent in counseling, explanation of diagnosis, planning of further management, and coordination of care.  MEDICATIONS: Scheduled Meds: . enoxaparin (LOVENOX) injection  40 mg Subcutaneous Q24H  . furosemide  40 mg Intravenous BID  . insulin aspart  0-5 Units Subcutaneous QHS  . insulin aspart  0-9 Units Subcutaneous TID WC  . insulin glargine  5 Units Subcutaneous QHS  . multivitamin with minerals  1 tablet Oral Daily  . [START ON 09/16/2019] potassium chloride  40 mEq Oral Daily  . sodium chloride flush  3 mL Intravenous Once  . spironolactone  100 mg Oral Daily   Continuous Infusions:  PRN Meds:.acetaminophen **OR** acetaminophen, ondansetron **OR** ondansetron (ZOFRAN) IV   PHYSICAL EXAM: Vital signs: Vitals:   09/14/19 1935 09/15/19 0416 09/15/19 0500 09/15/19 0816  BP: 122/75 113/73  122/71  Pulse: 98 89   90  Resp: 18 18  15   Temp: 97.7 F (36.5 C) 98.2 F (36.8 C)  98 F (36.7 C)  TempSrc: Oral Oral  Oral  SpO2: 99% 96%  98%  Weight:   (!) 163.2 kg    Filed Weights   09/14/19 0500 09/15/19 0500  Weight: (!) 167.9 kg (!) 163.2 kg   Body mass index is 44.98 kg/m.   Gen Exam:Alert awake-not in any distress HEENT:atraumatic, normocephalic Chest: B/L clear to auscultation anteriorly CVS:S1S2 regular Abdomen:soft non tender, non distended Extremities:+++ edema Neurology: Non focal Skin: no rash  I have personally reviewed following labs and imaging studies  LABORATORY DATA: CBC: Recent Labs  Lab 09/11/19 1702 09/12/19 0744 09/13/19 0513 09/14/19 0353 09/15/19 0240  WBC 5.7 3.9* 4.8 2.8* 3.3*  NEUTROABS  --  2.3  --   --   --   HGB 10.2* 10.5* 9.3* 8.2* 8.5*  HCT 33.7* 34.8* 30.8* 26.5* 27.1*  MCV 83.8 83.5 82.8 82.3 79.7*  PLT 143* 125* 121* 96* 95*    Basic Metabolic Panel: Recent Labs  Lab 09/11/19 1702 09/12/19 0744 09/13/19 0513 09/14/19 0353 09/15/19 0240  NA 140 141 140 144 141  K 4.0 3.5 5.6* 2.9* 3.2*  CL 109 108 108 108 106  CO2 23 24 18* 26 27  GLUCOSE 125* 105* 100* 97 103*  BUN 14 15 12 7  5*  CREATININE 0.77 0.72 0.66 0.66 0.62  CALCIUM 8.6* 8.6*  8.1* 8.1* 7.9*    GFR: Estimated Creatinine Clearance: 177.2 mL/min (by C-G formula based on SCr of 0.62 mg/dL).  Liver Function Tests: Recent Labs  Lab 09/12/19 0744 09/14/19 0353 09/15/19 0240  AST 61* 43* 42*  ALT 21 15 14   ALKPHOS 126 78 78  BILITOT 1.1 1.0 1.0  PROT 6.7 5.2* 5.1*  ALBUMIN 2.3* 2.3* 2.1*   Recent Labs  Lab 09/12/19 0744  LIPASE 20   No results for input(s): AMMONIA in the last 168 hours.  Coagulation Profile: Recent Labs  Lab 09/14/19 0353  INR 1.6*    Cardiac Enzymes: No results for input(s): CKTOTAL, CKMB, CKMBINDEX, TROPONINI in the last 168 hours.  BNP (last 3 results) No results for input(s): PROBNP in the last 8760 hours.  Lipid Profile: No  results for input(s): CHOL, HDL, LDLCALC, TRIG, CHOLHDL, LDLDIRECT in the last 72 hours.  Thyroid Function Tests: No results for input(s): TSH, T4TOTAL, FREET4, T3FREE, THYROIDAB in the last 72 hours.  Anemia Panel: No results for input(s): VITAMINB12, FOLATE, FERRITIN, TIBC, IRON, RETICCTPCT in the last 72 hours.  Urine analysis:    Component Value Date/Time   COLORURINE AMBER (A) 09/12/2019 1057   APPEARANCEUR HAZY (A) 09/12/2019 1057   LABSPEC 1.030 09/12/2019 1057   PHURINE 5.0 09/12/2019 1057   GLUCOSEU NEGATIVE 09/12/2019 1057   HGBUR NEGATIVE 09/12/2019 1057   BILIRUBINUR NEGATIVE 09/12/2019 1057   KETONESUR NEGATIVE 09/12/2019 1057   PROTEINUR 30 (A) 09/12/2019 1057   NITRITE NEGATIVE 09/12/2019 1057   LEUKOCYTESUR NEGATIVE 09/12/2019 1057    Sepsis Labs: Lactic Acid, Venous    Component Value Date/Time   LATICACIDVEN 2.6 (HH) 06/10/2019 1345    MICROBIOLOGY: Recent Results (from the past 240 hour(s))  Culture, blood (single)     Status: None (Preliminary result)   Collection Time: 09/12/19 10:50 AM   Specimen: BLOOD RIGHT WRIST  Result Value Ref Range Status   Specimen Description BLOOD RIGHT WRIST  Final   Special Requests   Final    BOTTLES DRAWN AEROBIC AND ANAEROBIC Blood Culture adequate volume   Culture   Final    NO GROWTH 3 DAYS Performed at [facility], [place] [address] [place], [place] [address]    Report Status PENDING  Incomplete  SARS CORONAVIRUS 2 (TAT 6-24 HRS) Nasopharyngeal Nasopharyngeal Swab     Status: None   Collection Time: 09/12/19 11:22 AM   Specimen: Nasopharyngeal Swab  Result Value Ref Range Status   SARS Coronavirus 2 NEGATIVE NEGATIVE Final    Comment: (NOTE) SARS-CoV-2 target nucleic acids are NOT DETECTED. The SARS-CoV-2 RNA is generally detectable in upper and lower respiratory specimens during the acute phase of infection. Negative results do not preclude SARS-CoV-2 infection, do not rule out co-infections with  other pathogens, and should not be used as the sole basis for treatment or other patient management decisions. Negative results must be combined with clinical observations, patient history, and epidemiological information. The expected result is Negative. Fact Sheet for Patients: SugarRoll.be Fact Sheet for Healthcare Providers: https://www.woods-mathews.com/ This test is not yet approved or cleared by the [place] FDA and  has been authorized for detection and/or diagnosis of SARS-CoV-2 by FDA under an Emergency Use Authorization (EUA). This EUA will remain  in effect (meaning this test can be used) for the duration of the COVID-19 declaration under Section 56 4(b)(1) of the Act, 21 U.S.C. section 360bbb-3(b)(1), unless the authorization is terminated or revoked sooner. Performed at [facility]  [facility], [address] [place], [place] [address]   Culture, body fluid-bottle     Status: None (Preliminary result)   Collection Time: 09/13/19 12:31 PM   Specimen: Abscess  Result Value Ref Range Status   Specimen Description ABSCESS  Final   Special Requests GLUTEAL  Final   Culture   Final    NO GROWTH 2 DAYS Performed at [facility] [address] [place], [place] [address]    Report Status PENDING  Incomplete  Gram stain     Status: None   Collection Time: 09/13/19 12:31 PM   Specimen: Abscess  Result Value Ref Range Status   Specimen Description ABSCESS  Final   Special Requests GLUTEAL  Final   Gram Stain   Final    CYTOSPIN SMEAR WBC PRESENT, PREDOMINANTLY PMN NO ORGANISMS SEEN Performed at [facility], [place] [address] [place], [place] [address]    Report Status 09/13/2019 FINAL  Final  Fungus culture, blood     Status: None (Preliminary result)   Collection Time: 09/13/19 12:31 PM   Specimen: Abscess  Result Value Ref Range Status   Specimen Description ABSCESS  Final   Special Requests GLUTEAL  Final    Culture   Final    NO GROWTH 2 DAYS Performed at [facility] [address] [place], [place] [address]    Report Status PENDING  Incomplete  Culture, body fluid-bottle     Status: None (Preliminary result)   Collection Time: 09/13/19 12:31 PM   Specimen: Ascitic  Result Value Ref Range Status   Specimen Description ASCITIC  Final   Special Requests NONE  Final   Culture   Final    NO GROWTH 2 DAYS Performed at [facility] [address], [place], [place] [address]    Report Status PENDING  Incomplete  Gram stain     Status: None   Collection Time: 09/13/19 12:31 PM   Specimen: Ascitic  Result Value Ref Range Status   Specimen Description ASCITIC  Final   Special Requests NONE  Final   Gram Stain   Final    CYTOSPIN SMEAR WBC PRESENT, PREDOMINANTLY MONONUCLEAR NO ORGANISMS SEEN Performed at [facility], [place] [address] [place],  [address]    Report Status 09/13/2019 FINAL  Final    RADIOLOGY STUDIES/RESULTS: IR US Guide Bx Asp/Drain  Result Date: 09/13/2019 INDICATION: New diagnosis of cirrhosis, now with symptomatic intra-abdominal ascites. Additionally, patient with history of superinfected hematoma previously requiring image guided drainage catheter placement on 06/17/2019 with multiple subsequent operative debridement. Preceding CT scan demonstrated a recurrent fluid collection about the left lateral thigh and as such request made for image guided aspiration for diagnostic purposes. EXAM: 1. ULTRASOUND-GUIDED PARACENTESIS 2. ULTRASOUND-GUIDED ASPIRATION OF INDETERMINATE RECURRENT FLUID COLLECTION WITHIN THE SUPEROLATERAL ASPECT OF THE LEFT THIGH. COMPARISON:  CT abdomen and pelvis-10/02/2019; image guided left lateral thigh percutaneous drainage catheter placement-06/17/2019 MEDICATIONS: None. COMPLICATIONS: None immediate. TECHNIQUE: Informed written consent was obtained from the patient after a discussion of the risks, benefits and alternatives to  treatment. A timeout was performed prior to the initiation of the procedure. Initial ultrasound scanning demonstrates a moderate amount of ascites within the left lower abdomen which was subsequently prepped and draped in the usual sterile fashion. 1% lidocaine with epinephrine was used for local anesthesia. An ultrasound image was saved for documentation purposed. An 8 Fr Safe-T-Centesis catheter was introduced. The paracentesis was performed ultimately yielding  9 L of serous ascitic fluid. A representative sample was capped and sent to the laboratory for analysis. The catheter was removed. _________________________________________________________ Attention was now paid towards ultrasound-guided aspiration of recurrent indeterminate fluid collection within the subcutaneous tissues about the anterior superolateral aspect the left thigh. Sonographic evaluation demonstrates a at least 15.1 x 5.9 cm minimally complex fluid collection within the subcutaneous tissues about the superolateral aspect of the left thigh, subjacent to overlying surgical incision. The skin overlying the operative site with prepped and draped in usual sterile fashion. 1% lidocaine with epinephrine was utilized for local anesthesia. Under direct ultrasound guidance, an 18 gauge trocar needle was advanced into a cystic component of the complex fluid collection and a short Amplatz wire was coiled within the collection. Multiple image was saved procedural documentation purposes Next, the 18 gauge trocar needle was exchanged for a 15 cm Yueh sheath catheter which was utilized to aspirate approximately 350 cc of serous non foul smelling fluid as the catheter was slowly retracted. A representative sample was capped and sent to the laboratory for analysis. Dressings were applied. The patient tolerated the above procedures well without immediate postprocedural complication. FINDINGS: A total of approximately 9 liters of serous fluid was removed.  Technically successful ultrasound-guided aspiration of 350 cc of serous non foul smelling fluid from minimally complex fluid collection within the subcutaneous tissues about the superolateral aspect the left thigh, subjacent to the overlying surgical incision. Representative samples were sent separately from each collection to the laboratory for analysis. IMPRESSION: 1. Successful ultrasound-guided paracentesis yielding 9 liters of peritoneal fluid. 2. Successful ultrasound-guided aspiration of 350 cc of serous, non foul smelling fluid from minimally complex recurrent fluid collection with the subcutaneous tissues about the superolateral aspect of the left thigh, subjacent to the overlying surgical incision. 3. Representative samples were sent separately from each collection to the laboratory for analysis. Electronically Signed   By: Sandi Mariscal M.D.   On: 09/13/2019 14:24   VAS [place] LOWER EXTREMITY VENOUS (DVT)  Result Date: 09/14/2019  Lower Venous DVTStudy Indications: Edema. Other Indications: Anasarca. Risk Factors: Decompensated liver cirrhosis with anasarca. Limitations: Body habitus and interstitial edema. Comparison Study: No prior study on file Performing Technologist: Sharion Dove RVS  Examination Guidelines: A complete evaluation includes B-mode imaging, spectral Doppler, color Doppler, and power Doppler as needed of all accessible portions of each vessel. Bilateral testing is considered an integral part of a complete examination. Limited examinations for reoccurring indications may be performed as noted. The reflux portion of the exam is performed with the patient in reverse Trendelenburg.  +---------+---------------+---------+-----------+----------+-------------------+ RIGHT    CompressibilityPhasicitySpontaneityPropertiesThrombus Aging      +---------+---------------+---------+-----------+----------+-------------------+ CFV      Full           Yes      Yes                                       +---------+---------------+---------+-----------+----------+-------------------+ SFJ      Full                                                             +---------+---------------+---------+-----------+----------+-------------------+ FV Prox  Full  Yes      Yes                                      +---------+---------------+---------+-----------+----------+-------------------+ FV Mid                                                Not visualized      +---------+---------------+---------+-----------+----------+-------------------+ FV Distal                                             Not visualized      +---------+---------------+---------+-----------+----------+-------------------+ PFV      Full                                                             +---------+---------------+---------+-----------+----------+-------------------+ POP                     Yes      Yes                  patent by color and                                                       Doppler             +---------+---------------+---------+-----------+----------+-------------------+ PTV                                                   patent by color     +---------+---------------+---------+-----------+----------+-------------------+ PERO                                                  patent by color     +---------+---------------+---------+-----------+----------+-------------------+   +---------+---------------+---------+-----------+----------+-------------------+ LEFT     CompressibilityPhasicitySpontaneityPropertiesThrombus Aging      +---------+---------------+---------+-----------+----------+-------------------+ CFV      Full           Yes      Yes                                      +---------+---------------+---------+-----------+----------+-------------------+ SFJ      Full                                                              +---------+---------------+---------+-----------+----------+-------------------+  FV Prox                 Yes      Yes                  patent by color and                                                       Doppler             +---------+---------------+---------+-----------+----------+-------------------+ FV Mid                  Yes      Yes                  patent by color and                                                       Doppler             +---------+---------------+---------+-----------+----------+-------------------+ FV Distal               Yes      Yes                  patent by color and                                                       Doppler             +---------+---------------+---------+-----------+----------+-------------------+ PFV                                                   Not visualized      +---------+---------------+---------+-----------+----------+-------------------+ POP      Full           Yes      Yes                                      +---------+---------------+---------+-----------+----------+-------------------+ PTV                                                   patent by color     +---------+---------------+---------+-----------+----------+-------------------+ PERO                                                  patent by color     +---------+---------------+---------+-----------+----------+-------------------+     Summary: RIGHT: - There is no evidence of deep vein thrombosis in the lower extremity. However, portions  of this examination were limited- see technologist comments above.  LEFT: - There is no evidence of deep vein thrombosis in the lower extremity. However, portions of this examination were limited- see technologist comments above.  *See table(s) above for measurements and observations. Electronically signed by Monica Martinez MD on 09/14/2019 at 11:02:56 AM.    Final    IR  Paracentesis  Result Date: 09/13/2019 INDICATION: New diagnosis of cirrhosis, now with symptomatic intra-abdominal ascites. Additionally, patient with history of superinfected hematoma previously requiring image guided drainage catheter placement on 06/17/2019 with multiple subsequent operative debridement. Preceding CT scan demonstrated a recurrent fluid collection about the left lateral thigh and as such request made for image guided aspiration for diagnostic purposes. EXAM: 1. ULTRASOUND-GUIDED PARACENTESIS 2. ULTRASOUND-GUIDED ASPIRATION OF INDETERMINATE RECURRENT FLUID COLLECTION WITHIN THE SUPEROLATERAL ASPECT OF THE LEFT THIGH. COMPARISON:  CT abdomen and pelvis-10/02/2019; image guided left lateral thigh percutaneous drainage catheter placement-06/17/2019 MEDICATIONS: None. COMPLICATIONS: None immediate. TECHNIQUE: Informed written consent was obtained from the patient after a discussion of the risks, benefits and alternatives to treatment. A timeout was performed prior to the initiation of the procedure. Initial ultrasound scanning demonstrates a moderate amount of ascites within the left lower abdomen which was subsequently prepped and draped in the usual sterile fashion. 1% lidocaine with epinephrine was used for local anesthesia. An ultrasound image was saved for documentation purposed. An 8 Fr Safe-T-Centesis catheter was introduced. The paracentesis was performed ultimately yielding 9 L of serous ascitic fluid. A representative sample was capped and sent to the laboratory for analysis. The catheter was removed. _________________________________________________________ Attention was now paid towards ultrasound-guided aspiration of recurrent indeterminate fluid collection within the subcutaneous tissues about the anterior superolateral aspect the left thigh. Sonographic evaluation demonstrates a at least 15.1 x 5.9 cm minimally complex fluid collection within the subcutaneous tissues about the  superolateral aspect of the left thigh, subjacent to overlying surgical incision. The skin overlying the operative site with prepped and draped in usual sterile fashion. 1% lidocaine with epinephrine was utilized for local anesthesia. Under direct ultrasound guidance, an 18 gauge trocar needle was advanced into a cystic component of the complex fluid collection and a short Amplatz wire was coiled within the collection. Multiple image was saved procedural documentation purposes Next, the 18 gauge trocar needle was exchanged for a 15 cm Yueh sheath catheter which was utilized to aspirate approximately 350 cc of serous non foul smelling fluid as the catheter was slowly retracted. A representative sample was capped and sent to the laboratory for analysis. Dressings were applied. The patient tolerated the above procedures well without immediate postprocedural complication. FINDINGS: A total of approximately 9 liters of serous fluid was removed. Technically successful ultrasound-guided aspiration of 350 cc of serous non foul smelling fluid from minimally complex fluid collection within the subcutaneous tissues about the superolateral aspect the left thigh, subjacent to the overlying surgical incision. Representative samples were sent separately from each collection to the laboratory for analysis. IMPRESSION: 1. Successful ultrasound-guided paracentesis yielding 9 liters of peritoneal fluid. 2. Successful ultrasound-guided aspiration of 350 cc of serous, non foul smelling fluid from minimally complex recurrent fluid collection with the subcutaneous tissues about the superolateral aspect of the left thigh, subjacent to the overlying surgical incision. 3. Representative samples were sent separately from each collection to the laboratory for analysis. Electronically Signed   By: Sandi Mariscal M.D.   On: 09/13/2019 14:24     LOS: 3 days   Oren Binet, MD  [facility]    To contact the attending provider  between 7A-7P or the covering provider during after hours 7P-7A, please log into the web site [email or web address] and access using universal [place] password for that web site. If you do not have the password, please call the hospital operator.  09/15/2019, 12:02 PM

## 2019-09-15 NOTE — Evaluation (Signed)
Physical Therapy Evaluation Patient Details Name: Robert Sullivan MRN: [id number] DOB: 1965/09/18 Today's Date: 09/15/2019   History of Present Illness  54 year old male with recent history of fall and subsequent gluteal abscess. Pt with recent hospitalization for this, has undergone IR drainage and x6 surgical I&Ds Dec 2020-Jan 2021 with excision of necrotic gluteal. Pt readmitted 3/4 with shortness of breath, fluid collection L gluteal soft tissue (pending culture), cirrhosis with ascites s/p paracentesis and L thigh aspiration 3/6. PMHx:DM2, OA, septic shock, obesity, and arthritis.  Clinical Impression   Pt presents with generalized LE weakness L>R, mild unsteadiness in standing, decreased activity tolerance, and L hip discomfort evident with mobility. Pt to benefit from acute PT to address deficits. Pt ambulated hallway distance with supervision level of assist, pt using hallway railing to steady self intermittently. Pt is determined to increase strength and return to PLOF, PT recommending OPPT to address deficits post-acutely.  PT to progress mobility as tolerated, and will continue to follow acutely.      Follow Up Recommendations Outpatient PT    Equipment Recommendations  None recommended by PT    Recommendations for Other Services       Precautions / Restrictions Precautions Precautions: Fall Restrictions Weight Bearing Restrictions: No      Mobility  Bed Mobility Overal bed mobility: Modified Independent             General bed mobility comments: Increased time with use of bedrails, no physical assist  Transfers Overall transfer level: Needs assistance   Transfers: Sit to/from Stand Sit to Stand: Supervision         General transfer comment: supervision for safety, use of bedrails to come to standing.  Ambulation/Gait Ambulation/Gait assistance: Supervision Gait Distance (Feet): 200 Feet Assistive device: None Gait Pattern/deviations: Step-through  pattern;Decreased stride length;Antalgic;Wide base of support Gait velocity: decr   General Gait Details: supervision for safety, pt with intermittent use of hallway railing for self-steadying. Pt with decreased stance time on LLE, pt reports no pain but PT suspects minor discomfort and hip weakness.  Stairs            Wheelchair Mobility    Modified Rankin (Stroke Patients Only)       Balance Overall balance assessment: Needs assistance;History of Falls Sitting-balance support: No upper extremity supported;Feet supported Sitting balance-Leahy Scale: Normal     Standing balance support: No upper extremity supported;During functional activity Standing balance-Leahy Scale: Fair Standing balance comment: reaches for hallway railing for steadying intermittently                             Pertinent Vitals/Pain Pain Assessment: No/denies pain    Home Living Family/patient expects to be discharged to:: Private residence Living Arrangements: Children;Spouse/significant other Available Help at Discharge: Family;Available 24 hours/day Type of Home: House Home Access: Stairs to enter Entrance Stairs-Rails: None Entrance Stairs-Number of Steps: 4 Home Layout: Two level;Bed/bath upstairs Home Equipment: Walker - 2 wheels;Crutches      Prior Function Level of Independence: Independent with assistive device(s)         Comments: pt reports using crutches to negotiate stairs at home, states he ambulates without AD. Pt does require assist for L thigh dressing changes due to area being difficult to reach.     Hand Dominance   Dominant Hand: Right    Extremity/Trunk Assessment   Upper Extremity Assessment Upper Extremity Assessment: Overall WFL for tasks assessed    Lower Extremity  Assessment Lower Extremity Assessment: Generalized weakness    Cervical / Trunk Assessment Cervical / Trunk Assessment: Normal  Communication   Communication: No difficulties   Cognition Arousal/Alertness: Awake/alert Behavior During Therapy: WFL for tasks assessed/performed Overall Cognitive Status: Within Functional Limits for tasks assessed                                 General Comments: Pt pleasant and conversive with PT.      General Comments      Exercises General Exercises - Lower Extremity Long Arc Quad: AROM;Both;5 reps;Seated   Assessment/Plan    PT Assessment Patient needs continued PT services  PT Problem List Decreased strength;Decreased mobility;Decreased activity tolerance;Decreased balance;Pain;Obesity       PT Treatment Interventions DME instruction;Therapeutic activities;Gait training;Therapeutic exercise;Patient/family education;Balance training;Stair training;Functional mobility training    PT Goals (Current goals can be found in the Care Plan section)  Acute Rehab PT Goals Patient Stated Goal: get stronger PT Goal Formulation: With patient Time For Goal Achievement: 09/29/19 Potential to Achieve Goals: Good    Frequency Min 3X/week   Barriers to discharge        Co-evaluation               AM-PAC PT "6 Clicks" Mobility  Outcome Measure Help needed turning from your back to your side while in a flat bed without using bedrails?: None Help needed moving from lying on your back to sitting on the side of a flat bed without using bedrails?: A Little Help needed moving to and from a bed to a chair (including a wheelchair)?: A Little Help needed standing up from a chair using your arms (e.g., wheelchair or bedside chair)?: None Help needed to walk in hospital room?: None Help needed climbing 3-5 steps with a railing? : A Little 6 Click Score: 21    End of Session   Activity Tolerance: Patient tolerated treatment well;Patient limited by fatigue Patient left: in chair;with call bell/phone within reach(pt verbalizes he will not get up without assist) Nurse Communication: Mobility status PT Visit  Diagnosis: Other abnormalities of gait and mobility (R26.89);Muscle weakness (generalized) (M62.81)    Time: 5027-7412 PT Time Calculation (min) (ACUTE ONLY): 14 min   Charges:   PT Evaluation $PT Eval Low Complexity: 1 Low        Chai Verdejo E, PT Acute Rehabilitation Services Pager [phone]  Office [phone]  Simran Mannis D Gabryel Talamo 09/15/2019, 10:30 AM

## 2019-09-16 ENCOUNTER — Encounter (HOSPITAL_COMMUNITY): Payer: Self-pay | Admitting: Internal Medicine

## 2019-09-16 LAB — CBC
HCT: 28.6 % — ABNORMAL LOW (ref 39.0–52.0)
Hemoglobin: 9.2 g/dL — ABNORMAL LOW (ref 13.0–17.0)
MCH: 25.6 pg — ABNORMAL LOW (ref 26.0–34.0)
MCHC: 32.2 g/dL (ref 30.0–36.0)
MCV: 79.7 fL — ABNORMAL LOW (ref 80.0–100.0)
Platelets: 96 10*3/uL — ABNORMAL LOW (ref 150–400)
RBC: 3.59 MIL/uL — ABNORMAL LOW (ref 4.22–5.81)
RDW: 16.5 % — ABNORMAL HIGH (ref 11.5–15.5)
WBC: 3.4 10*3/uL — ABNORMAL LOW (ref 4.0–10.5)
nRBC: 1.5 % — ABNORMAL HIGH (ref 0.0–0.2)

## 2019-09-16 LAB — COMPREHENSIVE METABOLIC PANEL
ALT: 14 U/L (ref 0–44)
AST: 69 U/L — ABNORMAL HIGH (ref 15–41)
Albumin: 2.2 g/dL — ABNORMAL LOW (ref 3.5–5.0)
Alkaline Phosphatase: 81 U/L (ref 38–126)
Anion gap: 11 (ref 5–15)
BUN: <5 mg/dL — ABNORMAL LOW (ref 6–20)
CO2: 27 mmol/L (ref 22–32)
Calcium: 8.2 mg/dL — ABNORMAL LOW (ref 8.9–10.3)
Chloride: 102 mmol/L (ref 98–111)
Creatinine, Ser: 0.78 mg/dL (ref 0.61–1.24)
GFR calc Af Amer: >60 mL/min (ref >60)
GFR calc non Af Amer: >60 mL/min (ref >60)
Glucose, Bld: 94 mg/dL (ref 70–99)
Potassium: 4.3 mmol/L (ref 3.5–5.1)
Sodium: 140 mmol/L (ref 135–145)
Total Bilirubin: 1.2 mg/dL (ref 0.3–1.2)
Total Protein: 5.2 g/dL — ABNORMAL LOW (ref 6.5–8.1)

## 2019-09-16 LAB — ACID FAST SMEAR (AFB, MYCOBACTERIA): Acid Fast Smear: NEGATIVE

## 2019-09-16 LAB — GLUCOSE, CAPILLARY
Glucose-Capillary: 93 mg/dL (ref 70–99)
Glucose-Capillary: 104 mg/dL — ABNORMAL HIGH (ref 70–99)
Glucose-Capillary: 112 mg/dL — ABNORMAL HIGH (ref 70–99)
Glucose-Capillary: 108 mg/dL — ABNORMAL HIGH (ref 70–99)

## 2019-09-16 LAB — PATHOLOGIST SMEAR REVIEW

## 2019-09-16 LAB — MAGNESIUM: Magnesium: 1.7 mg/dL (ref 1.7–2.4)

## 2019-09-16 MED ORDER — MAGNESIUM SULFATE 2 GM/50ML IV SOLN
2.0000 g | Freq: Once | INTRAVENOUS | Status: AC
  Administered 2019-09-16: 2 g via INTRAVENOUS
  Filled 2019-09-16: qty 50

## 2019-09-16 MED ORDER — POTASSIUM CHLORIDE CRYS ER 20 MEQ PO TBCR
20.0000 meq | EXTENDED_RELEASE_TABLET | Freq: Every day | ORAL | Status: DC
  Administered 2019-09-16: 09:00:00 20 meq via ORAL
  Filled 2019-09-16: qty 1

## 2019-09-16 NOTE — Plan of Care (Signed)
  Problem: Activity: Goal: Risk for activity intolerance will decrease Outcome: Progressing   

## 2019-09-16 NOTE — Progress Notes (Signed)
PROGRESS NOTE        PATIENT DETAILS Name: Robert Sullivan Age: 54 y.o. Sex: male Date of Birth: 01/25/1966 Admit Date: 09/11/2019 Admitting Physician Orson Eva, MD ESP:[id number], Tenna Child, PA-C  Brief Narrative: Patient is a 54 y.o. male with history of Karlene Lineman cirrhosis-recent history of MSSA bacteremia-recurrent left gluteal abscess-s/p numerous readmissions/debridements-referred from GI office for evaluation of anasarca in the setting of decompensated liver cirrhosis.  Found to have rim enhancing lesion on CT abdomen on admission within the left gluteal soft tissue area-concerning for an abscess-however aspiration cultures are negative so far raising question of whether this was actually a seroma.  Significant events this admit: 3/5>> admit to [facility] from GI office for evaluation of anasarca 3/6>> ultrasound guided large-volume paracentesis (9 L) by IR 3/6>> ultrasound-guided aspiration of left gluteal abscess by IR 3/5>>TTE-EF 60-65% 3/5>>Lower Ext Doppler-no DVT  Antibiotics this admit: Vancomycin:3/4>>3/6 Unasyn:3/6>> 3/8  Prior relevant admissions: 05/12/19>>05/17/19: Admit for sepsis secondary to UTI, left hip hematoma due to fall. 06/10/2019>> 06/30/2019: Admit for MSSA bacteremia, left gluteal abscess 07/09/2019>> 07/25/2019: Admit by ortho for recurrent left hip abscess-s/p debridement x3  Prior relevant Procedures: 07/18/19>> repeat irrigation and debridement of left hip 07/16/19>> left hip debridement 07/12/19>> left hip debridement 06/16/19>> TEE-no vegetation 06/17/19>> ultrasound-guided left gluteal abscess drain placement by IR 07/09/19>> I&D of left hip abscess 06/25/19>> I&D of left gluteal abscess 06/24/19>> I&D of left gluteal abscess  Microbiology: 09/13/19>>left gluteus aspiration culture: Negative 09/12/19>>blood culture: Negative 07/16/19>> tissue left hip: Enterococcus faecalis, Finegoldia magna 07/09/19>>Tissue left hip: Enterobacter  cloacae 06/17/19>> left gluteal abscess: MSSA 06/10/19>> blood culture: MSSA 06/10/19>> urine culture: MSSA 05/12/19>> urine culture: E. Coli  Consults: IR ID Ortho  Subjective: Continues to have significant amount of volume overload but improving.  Afebrile.  He has no pain in the left gluteal area.  Assessment/Plan: Anasarca/ascites/bilateral pleural effusion: Secondary to decompensated liver cirrhosis  UA without evidence of proteinuria, echo with preserved EF.  Underwent large-volume paracentesis (9 L) on 3/6.  Volume status continues to improve-not charted accurately I suspect-but weight decreased to 347.9 pounds (370 pounds on admission).  Although improved-still with volume overload on exam-continue IV Lasix and Aldactone.  Lower extremity Dopplers negative for DVT.  Portal hypertension seen on CT imaging: Will need EGD at some point to confirm portal hypertensive gastropathy/survey for varices.  Recurrent left gluteal abscess vs Seroma: S/p ultrasound-guided aspiration on 3/6-cultures negative so far.  Appreciate ID evaluation today-since cultures are negative-question whether CT/MRI findings were most suggestive of a seroma in the setting of anasarca.  ID recommends that we stop Unasyn-and follow cultures (negative so far).  Hyperkalemia: Resolved  Hypokalemia: Repleted-magnesium less than 2-we will replete magnesium today.  On daily KCl-decreased dose to 20 mEq.  DM-2 (A1c 4.6 on 3/5): CBG stable-no longer on Lantus-on SSI.  Recent Labs    09/15/19 2059 09/16/19 0630 09/16/19 1138  GLUCAP 112* 93 104*   Atypical chest pain: Resolved-troponins negative.  Thrombocytopenia: Mild-secondary to hypersplenism in the setting of liver cirrhosis.  Anemia: Probably secondary to chronic liver disease.  Follow for now.  Obesity: Estimated body mass index is 43.48 kg/m as calculated from the following:   Height as of 09/04/19: 6' 3"  (1.905 m).   Weight as of this encounter: 157.8  kg.    Diet: Diet Order  Diet Carb Modified Fluid consistency: Thin; Room service appropriate? Yes; Fluid restriction: 1500 mL Fluid  Diet effective now               DVT Prophylaxis: Prophylactic Lovenox   Code Status: Full code   Family Communication: Spouse at bedside on 3/9  Disposition Plan: Home-probably next 1-2 days depending on diuresis.  Barriers to Discharge: Massive anarsarca needing IV diuretics, recurrent left hip/gluteus abscess-on IV abx  Antimicrobial agents: Anti-infectives (From admission, onward)   Start     Dose/Rate Route Frequency Ordered Stop   09/13/19 1800  Ampicillin-Sulbactam (UNASYN) 3 g in sodium chloride 0.9 % 100 mL IVPB  Status:  Discontinued     3 g 200 mL/hr over 30 Minutes Intravenous Every 6 hours 09/13/19 1621 09/15/19 1101   09/12/19 2000  vancomycin (VANCOREADY) IVPB 1500 mg/300 mL  Status:  Discontinued     1,500 mg 150 mL/hr over 120 Minutes Intravenous Every 8 hours 09/12/19 1037 09/13/19 1629   09/12/19 1045  vancomycin (VANCOREADY) IVPB 2000 mg/400 mL     2,000 mg 200 mL/hr over 120 Minutes Intravenous  Once 09/12/19 1037 09/12/19 1354      Time spent: 25 minutes-Greater than 50% of this time was spent in counseling, explanation of diagnosis, planning of further management, and coordination of care.  MEDICATIONS: Scheduled Meds: . enoxaparin (LOVENOX) injection  40 mg Subcutaneous Q24H  . feeding supplement (PRO-STAT SUGAR FREE 64)  30 mL Oral QID  . furosemide  40 mg Intravenous BID  . insulin aspart  0-5 Units Subcutaneous QHS  . insulin aspart  0-9 Units Subcutaneous TID WC  . multivitamin with minerals  1 tablet Oral Daily  . potassium chloride  20 mEq Oral Daily  . sodium chloride flush  3 mL Intravenous Once  . spironolactone  100 mg Oral Daily   Continuous Infusions:  PRN Meds:.acetaminophen **OR** acetaminophen, ondansetron **OR** ondansetron (ZOFRAN) IV   PHYSICAL EXAM: Vital  signs: Vitals:   09/15/19 1533 09/15/19 2013 09/16/19 0613 09/16/19 0740  BP: 131/67 124/70 (!) 109/56 (!) 141/66  Pulse: 86 80 86 92  Resp: 17 17 17 18   Temp: 98.1 F (36.7 C) 98.2 F (36.8 C) 98 F (36.7 C) 97.8 F (36.6 C)  TempSrc: Oral Oral  Oral  SpO2: 99% 99% 98% 98%  Weight:   (!) 157.8 kg    Filed Weights   09/14/19 0500 09/15/19 0500 09/16/19 0613  Weight: (!) 167.9 kg (!) 163.2 kg (!) 157.8 kg   Body mass index is 43.48 kg/m.   Gen Exam:Alert awake-not in any distress HEENT:atraumatic, normocephalic Chest: B/L clear to auscultation anteriorly CVS:S1S2 regular Abdomen:soft non tender, non distended Extremities:++ edema Neurology: Non focal Skin: no rash  I have personally reviewed following labs and imaging studies  LABORATORY DATA: CBC: Recent Labs  Lab 09/12/19 0744 09/13/19 0513 09/14/19 0353 09/15/19 0240 09/16/19 0346  WBC 3.9* 4.8 2.8* 3.3* 3.4*  NEUTROABS 2.3  --   --   --   --   HGB 10.5* 9.3* 8.2* 8.5* 9.2*  HCT 34.8* 30.8* 26.5* 27.1* 28.6*  MCV 83.5 82.8 82.3 79.7* 79.7*  PLT 125* 121* 96* 95* 96*    Basic Metabolic Panel: Recent Labs  Lab 09/12/19 0744 09/13/19 0513 09/14/19 0353 09/15/19 0240 09/16/19 0346  NA 141 140 144 141 140  K 3.5 5.6* 2.9* 3.2* 4.3  CL 108 108 108 106 102  CO2 24 18* 26 27 27   GLUCOSE 105* 100*  97 103* 94  BUN 15 12 7  5* <5*  CREATININE 0.72 0.66 0.66 0.62 0.78  CALCIUM 8.6* 8.1* 8.1* 7.9* 8.2*  MG  --   --   --   --  1.7    GFR: Estimated Creatinine Clearance: 173.9 mL/min (by C-G formula based on SCr of 0.78 mg/dL).  Liver Function Tests: Recent Labs  Lab 09/12/19 0744 09/14/19 0353 09/15/19 0240 09/16/19 0346  AST 61* 43* 42* 69*  ALT 21 15 14 14   ALKPHOS 126 78 78 81  BILITOT 1.1 1.0 1.0 1.2  PROT 6.7 5.2* 5.1* 5.2*  ALBUMIN 2.3* 2.3* 2.1* 2.2*   Recent Labs  Lab 09/12/19 0744  LIPASE 20   No results for input(s): AMMONIA in the last 168 hours.  Coagulation Profile: Recent  Labs  Lab 09/14/19 0353  INR 1.6*    Cardiac Enzymes: No results for input(s): CKTOTAL, CKMB, CKMBINDEX, TROPONINI in the last 168 hours.  BNP (last 3 results) No results for input(s): PROBNP in the last 8760 hours.  Lipid Profile: No results for input(s): CHOL, HDL, LDLCALC, TRIG, CHOLHDL, LDLDIRECT in the last 72 hours.  Thyroid Function Tests: No results for input(s): TSH, T4TOTAL, FREET4, T3FREE, THYROIDAB in the last 72 hours.  Anemia Panel: No results for input(s): VITAMINB12, FOLATE, FERRITIN, TIBC, IRON, RETICCTPCT in the last 72 hours.  Urine analysis:    Component Value Date/Time   COLORURINE AMBER (A) 09/12/2019 1057   APPEARANCEUR HAZY (A) 09/12/2019 1057   LABSPEC 1.030 09/12/2019 1057   PHURINE 5.0 09/12/2019 1057   GLUCOSEU NEGATIVE 09/12/2019 1057   HGBUR NEGATIVE 09/12/2019 1057   BILIRUBINUR NEGATIVE 09/12/2019 1057   KETONESUR NEGATIVE 09/12/2019 1057   PROTEINUR 30 (A) 09/12/2019 1057   NITRITE NEGATIVE 09/12/2019 1057   LEUKOCYTESUR NEGATIVE 09/12/2019 1057    Sepsis Labs: Lactic Acid, Venous    Component Value Date/Time   LATICACIDVEN 2.6 (HH) 06/10/2019 1345    MICROBIOLOGY: Recent Results (from the past 240 hour(s))  Culture, blood (single)     Status: None (Preliminary result)   Collection Time: 09/12/19 10:50 AM   Specimen: BLOOD RIGHT WRIST  Result Value Ref Range Status   Specimen Description BLOOD RIGHT WRIST  Final   Special Requests   Final    BOTTLES DRAWN AEROBIC AND ANAEROBIC Blood Culture adequate volume   Culture   Final    NO GROWTH 3 DAYS Performed at [facility], [place] [address], [place], [place] [address]    Report Status PENDING  Incomplete  SARS CORONAVIRUS 2 (TAT 6-24 HRS) Nasopharyngeal Nasopharyngeal Swab     Status: None   Collection Time: 09/12/19 11:22 AM   Specimen: Nasopharyngeal Swab  Result Value Ref Range Status   SARS Coronavirus 2 NEGATIVE NEGATIVE Final    Comment: (NOTE) SARS-CoV-2 target  nucleic acids are NOT DETECTED. The SARS-CoV-2 RNA is generally detectable in upper and lower respiratory specimens during the acute phase of infection. Negative results do not preclude SARS-CoV-2 infection, do not rule out co-infections with other pathogens, and should not be used as the sole basis for treatment or other patient management decisions. Negative results must be combined with clinical observations, patient history, and epidemiological information. The expected result is Negative. Fact Sheet for Patients: SugarRoll.be Fact Sheet for Healthcare Providers: https://www.woods-mathews.com/ This test is not yet approved or cleared by the [place] FDA and  has been authorized for detection and/or diagnosis of SARS-CoV-2 by FDA under an Emergency Use Authorization (EUA).  This EUA will remain  in effect (meaning this test can be used) for the duration of the COVID-19 declaration under Section 56 4(b)(1) of the Act, 21 U.S.C. section 360bbb-3(b)(1), unless the authorization is terminated or revoked sooner. Performed at [facility], [place] [address], [place], [place] [address]   Culture, body fluid-bottle     Status: None (Preliminary result)   Collection Time: 09/13/19 12:31 PM   Specimen: Abscess  Result Value Ref Range Status   Specimen Description ABSCESS  Final   Special Requests GLUTEAL  Final   Culture   Final    NO GROWTH 2 DAYS Performed at [place] [address], [place], [place] [address]    Report Status PENDING  Incomplete  Gram stain     Status: None   Collection Time: 09/13/19 12:31 PM   Specimen: Abscess  Result Value Ref Range Status   Specimen Description ABSCESS  Final   Special Requests GLUTEAL  Final   Gram Stain   Final    CYTOSPIN SMEAR WBC PRESENT, PREDOMINANTLY PMN NO ORGANISMS SEEN Performed at [facility], [place] [address], [place], [place] [address]    Report Status  09/13/2019 FINAL  Final  Fungus culture, blood     Status: None (Preliminary result)   Collection Time: 09/13/19 12:31 PM   Specimen: Abscess  Result Value Ref Range Status   Specimen Description ABSCESS  Final   Special Requests GLUTEAL  Final   Culture   Final    NO GROWTH 2 DAYS Performed at [place] [address], [place], [place] [address]    Report Status PENDING  Incomplete  Culture, body fluid-bottle     Status: None (Preliminary result)   Collection Time: 09/13/19 12:31 PM   Specimen: Ascitic  Result Value Ref Range Status   Specimen Description ASCITIC  Final   Special Requests NONE  Final   Culture   Final    NO GROWTH 2 DAYS Performed at [place] [address], [place], [place] [address]    Report Status PENDING  Incomplete  Gram stain     Status: None   Collection Time: 09/13/19 12:31 PM   Specimen: Ascitic  Result Value Ref Range Status   Specimen Description ASCITIC  Final   Special Requests NONE  Final   Gram Stain   Final    CYTOSPIN SMEAR WBC PRESENT, PREDOMINANTLY MONONUCLEAR NO ORGANISMS SEEN Performed at [facility], [place] [address], [place], [place] [address]    Report Status 09/13/2019 FINAL  Final    RADIOLOGY STUDIES/RESULTS: No results found.   LOS: 4 days   Oren Binet, MD  [facility]    To contact the attending provider between 7A-7P or the covering provider during after hours 7P-7A, please log into the web site [email or web address] and access using universal [place] password for that web site. If you do not have the password, please call the hospital operator.  09/16/2019, 11:59 AM

## 2019-09-16 NOTE — TOC Initial Note (Addendum)
Transition of Care [facility]) - Initial/Assessment Note    Patient Details  Name: Robert Sullivan MRN: [id number] Date of Birth: 1966-05-20  Transition of Care [facility]) CM/SW Contact:    Sharin Mons, RN Phone Number: 09/16/2019, 2:43 PM  Clinical Narrative:   Admitted for anasarca evaluation.           From home with wife/family. States fairly independent with ADL's. States no DME needs. States able to care for L gluteal wound with wife assistance. Pt self employed, owns business. PCP: [facility]. No present needs identified per NCM.  TOC team will continue to monitor.   Expected Discharge Plan: Home/Self Care Barriers to Discharge: Continued Medical Work up   Patient Goals and CMS Choice Patient states their goals for this hospitalization and ongoing recovery are:: to go home and take care of self      Expected Discharge Plan and Services Expected Discharge Plan: Home/Self Care   Discharge Planning Services: CM Consult   Living arrangements for the past 2 months: Single Family Home     Prior Living Arrangements/Services Living arrangements for the past 2 months: Single Family Home Lives with:: Spouse Patient language and need for interpreter reviewed:: Yes Do you feel safe going back to the place where you live?: Yes      Need for Family Participation in Patient Care: Yes (Comment) Care giver support system in place?: Yes (comment)   Criminal Activity/Legal Involvement Pertinent to Current Situation/Hospitalization: No - Comment as needed  Activities of Daily Living Home Assistive Devices/Equipment: None ADL Screening (condition at time of admission) Patient's cognitive ability adequate to safely complete daily activities?: No Is the patient deaf or have difficulty hearing?: No Does the patient have difficulty seeing, even when wearing glasses/contacts?: No Does the patient have difficulty concentrating, remembering, or making decisions?: No Patient able to  express need for assistance with ADLs?: Yes Does the patient have difficulty dressing or bathing?: No Independently performs ADLs?: Yes (appropriate for developmental age) Does the patient have difficulty walking or climbing stairs?: Yes Weakness of Legs: Both Weakness of Arms/Hands: None  Permission Sought/Granted   Permission granted to share information with : Yes, Verbal Permission Granted  Share Information with NAME: Makayla Confer (ELTRVU)[phone]           Emotional Assessment Appearance:: Appears stated age Attitude/Demeanor/Rapport: Engaged Affect (typically observed): Accepting Orientation: : Oriented to Self, Oriented to Place, Oriented to  Time, Oriented to Situation Alcohol / Substance Use: Not Applicable Psych Involvement: No (comment)  Admission diagnosis:  Anasarca [R60.1] Pleural effusion [J90] Abscess, gluteal, left [L02.31] Ascites [R18.8] Cirrhosis of liver with ascites (HCC) [K74.60, R18.8] Cirrhosis of liver with ascites, unspecified hepatic cirrhosis type ([place]) [K74.60, R18.8] Patient Active Problem List   Diagnosis Date Noted  . Ascites   . Anasarca 09/12/2019  . Cirrhosis of liver with ascites ([place]) 09/12/2019  . Abscess, gluteal, left 09/12/2019  . Pleural effusion 09/12/2019  . Thrombocytopenia ([place]) 09/12/2019  . Blister of hip with infection 07/09/2019  . Abscess of left hip   . MSSA bacteremia 06/19/2019  . Uncontrolled diabetes mellitus ([place]) 06/10/2019  . Obesity, Class III, BMI 40-49.9 (morbid obesity) ([place]) 06/10/2019  . Cellulitis, gluteal, left 06/10/2019  . Sepsis secondary to UTI ([place]) 05/13/2019  . Acute lower UTI 05/13/2019  . Type 2 diabetes mellitus without complication ([place]) 16/83/7290  . Hip pain, acute, left 05/13/2019  . Sepsis ([place]) 05/13/2019  . Acute cystitis without hematuria   . Hyperglycemia  PCP:  Cathleen Corti, PA-C Pharmacy:   [id number] [place] [id number]: [phone]: [phone]    Social Determinants of Health (SDOH) Interventions    Readmission Risk Interventions Readmission Risk Prevention Plan 09/16/2019  Transportation Screening Complete  PCP or Specialist Appt within 3-5 Days Complete  [place] or [place] refused  Social Work Consult for RB[place]anning/Counseling Patient refused  Palliative Care Screening Not Applicable  Medication Review (Press photographer Complete

## 2019-09-17 LAB — GLUCOSE, CAPILLARY
Glucose-Capillary: 109 mg/dL — ABNORMAL HIGH (ref 70–99)
Glucose-Capillary: 116 mg/dL — ABNORMAL HIGH (ref 70–99)
Glucose-Capillary: 98 mg/dL (ref 70–99)
Glucose-Capillary: 117 mg/dL — ABNORMAL HIGH (ref 70–99)

## 2019-09-17 LAB — COMPREHENSIVE METABOLIC PANEL
ALT: 14 U/L (ref 0–44)
AST: 43 U/L — ABNORMAL HIGH (ref 15–41)
Albumin: 2.1 g/dL — ABNORMAL LOW (ref 3.5–5.0)
Alkaline Phosphatase: 87 U/L (ref 38–126)
Anion gap: 10 (ref 5–15)
BUN: 5 mg/dL — ABNORMAL LOW (ref 6–20)
CO2: 29 mmol/L (ref 22–32)
Calcium: 8.3 mg/dL — ABNORMAL LOW (ref 8.9–10.3)
Chloride: 102 mmol/L (ref 98–111)
Creatinine, Ser: 0.73 mg/dL (ref 0.61–1.24)
GFR calc Af Amer: >60 mL/min (ref >60)
GFR calc non Af Amer: >60 mL/min (ref >60)
Glucose, Bld: 115 mg/dL — ABNORMAL HIGH (ref 70–99)
Potassium: 3.4 mmol/L — ABNORMAL LOW (ref 3.5–5.1)
Sodium: 141 mmol/L (ref 135–145)
Total Bilirubin: 1 mg/dL (ref 0.3–1.2)
Total Protein: 5.4 g/dL — ABNORMAL LOW (ref 6.5–8.1)

## 2019-09-17 LAB — CULTURE, BLOOD (SINGLE)
Culture: NO GROWTH
Special Requests: ADEQUATE

## 2019-09-17 MED ORDER — POTASSIUM CHLORIDE CRYS ER 20 MEQ PO TBCR
40.0000 meq | EXTENDED_RELEASE_TABLET | Freq: Every day | ORAL | Status: DC
  Administered 2019-09-17 – 2019-09-25 (×8): 40 meq via ORAL
  Filled 2019-09-17 (×9): qty 2

## 2019-09-17 MED ORDER — SPIRONOLACTONE 25 MG PO TABS
150.0000 mg | ORAL_TABLET | Freq: Every day | ORAL | Status: DC
  Administered 2019-09-18: 10:00:00 150 mg via ORAL
  Filled 2019-09-17: qty 6

## 2019-09-17 NOTE — Progress Notes (Signed)
Physical Therapy Treatment and Discharge from Acute PT Patient Details Name: Robert Sullivan MRN: [id number] DOB: 31-May-1966 Today's Date: 09/17/2019    History of Present Illness 54 year old male with recent history of fall and subsequent gluteal abscess. Pt with recent hospitalization for this, has undergone IR drainage and x6 surgical I&Ds Dec 2020-Jan 2021 with excision of necrotic gluteal. Pt readmitted 3/4 with shortness of breath, fluid collection L gluteal soft tissue (pending culture), cirrhosis with ascites s/p paracentesis and L thigh aspiration 3/6. PMHx:DM2, OA, septic shock, obesity, and arthritis.    PT Comments    Continuing work on functional mobility and activity tolerance;   Walking the hallways independently, no difficulty; all Acute PT goals met;  OK for dc home from PT standpoint   Follow Up Recommendations  Outpatient PT     Equipment Recommendations  None recommended by PT    Recommendations for Other Services       Precautions / Restrictions Precautions Precautions: Fall    Mobility  Bed Mobility Overal bed mobility: Modified Independent                Transfers Overall transfer level: Modified independent Equipment used: None Transfers: Sit to/from Stand Sit to Stand: Independent         General transfer comment: No difficulty  Ambulation/Gait Ambulation/Gait assistance: Independent Gait Distance (Feet): 300 Feet Assistive device: None Gait Pattern/deviations: Step-through pattern;Wide base of support     General Gait Details: No need for assistive device or use of hallway rail for steadying; no difficulty managing household distance   Stairs Stairs: Yes Stairs assistance: Supervision Stair Management: One rail Left;Forwards;Alternating pattern Number of Stairs: 8 General stair comments: No difficulty   Wheelchair Mobility    Modified Rankin (Stroke Patients Only)       Balance     Sitting balance-Leahy Scale: Normal        Standing balance-Leahy Scale: Good                              Cognition Arousal/Alertness: Awake/alert Behavior During Therapy: WFL for tasks assessed/performed Overall Cognitive Status: Within Functional Limits for tasks assessed                                 General Comments: Pt pleasant and conversive with PT.      Exercises      General Comments General comments (skin integrity, edema, etc.): Robert Sullivan describes lifestyle changes he is making to boost his health      Pertinent Vitals/Pain Pain Assessment: No/denies pain    Home Living                      Prior Function            PT Goals (current goals can now be found in the care plan section) Acute Rehab PT Goals Patient Stated Goal: get stronger Progress towards PT goals: Goals met/education completed, patient discharged from PT    Frequency           PT Plan Current plan remains appropriate    Co-evaluation              AM-PAC PT "6 Clicks" Mobility   Outcome Measure  Help needed turning from your back to your side while in a flat bed without using bedrails?: None Help needed moving from  lying on your back to sitting on the side of a flat bed without using bedrails?: None Help needed moving to and from a bed to a chair (including a wheelchair)?: None Help needed standing up from a chair using your arms (e.g., wheelchair or bedside chair)?: None Help needed to walk in hospital room?: None Help needed climbing 3-5 steps with a railing? : None 6 Click Score: 24    End of Session   Activity Tolerance: Patient tolerated treatment well Patient left: in bed;with call bell/phone within reach Nurse Communication: Mobility status PT Visit Diagnosis: Other abnormalities of gait and mobility (R26.89);Muscle weakness (generalized) (M62.81)     Time: 0931-1216 PT Time Calculation (min) (ACUTE ONLY): 12 min  Charges:  $Gait Training: 8-22 mins                      Roney Marion, [place] Pager [phone] Office [place] 09/17/2019, 1:40 PM

## 2019-09-17 NOTE — Progress Notes (Signed)
Nutrition Follow-up  DOCUMENTATION CODES:   Morbid obesity  INTERVENTION:   -Continue 30 ml Prostat QID, each supplement provides 100 kcals ans 15 grams protein -Continue double protein portions with meals -Continue MVI with minerals daily -Attached "Cirrhosis Nutrition Therapy" handout from [facility] Nutrition Care Manual; attached to AVS/ discharge summary  NUTRITION DIAGNOSIS:   Increased nutrient needs related to chronic illness(cirrhosis) as evidenced by estimated needs.  Ongoing  GOAL:   Patient will meet greater than or equal to 90% of their needs  Progressing   MONITOR:   PO intake, Supplement acceptance, Labs, Weight trends, Skin, I & O's  REASON FOR ASSESSMENT:   Consult Assessment of nutrition requirement/status, Diet education  ASSESSMENT:   Robert Sullivan is a 54 y.o. male with medical history of diabetes mellitus, left gluteal abscess, presenting with 1 to 7-monthhistory of increasing lower extremity edema and dyspnea on exertion.  Patient has had fairly complicated history with numerous recent hospital admissions.  Most notably, the patient was admitted to the hospital from 07/09/2019 to 07/25/2019 during which time the Patient underwent 2 separate I&D procedures.  Cultures on 07/16/2019 grew Enterococcus faecalis sensitive to amp and Finegoldia magna.  The patient was seen by infectious disease.  Per Dr. CHale Bogusnote on 07/15/2019, the patient was to finish 4 more weeks of cefepime.  At that point, the patient had had received 35 days of antibiotics.  The patient states that he has not been on any antibiotics since that period of time.  He has not had any fevers, chills, purulent drainage, or worsening lower extremity erythema.  3/6- s/p [place] large volume paracentesis (9 L) and [place] aspiration of recurrent fluid collection of lt lateral thigh  Reviewed I/O's: +520 ml x 24 hours and -495 ml since admission  UOP: 200 ml x 24 hours  Attempted to speak with  pt via phone, however, no answer.   Pt remains with good appetite; noted meal completion 100%. Per MAR, pt accepts 3/4 of Prostat supplements offered daily.   Per MD notes, plan to d/c home within the next 1-2 days, pending progress with diuresis.   Labs reviewed: K: 3.4, CBGS: 104-112 (inpatient orders for glycemic control are 0-9 units insulin aspart TID with meals).   Diet Order:   Diet Order            Diet Carb Modified Fluid consistency: Thin; Room service appropriate? Yes; Fluid restriction: 1500 mL Fluid  Diet effective now              EDUCATION NEEDS:   No education needs have been identified at this time  Skin:  Skin Assessment: Skin Integrity Issues: Skin Integrity Issues:: Other (Comment), Incisions Incisions: open lt thigh (weeping) Other: MASD skin fold between stomach and thigh  Last BM:  09/16/19  Height:   Ht Readings from Last 1 Encounters:  09/16/19 6' 2.5" (1.892 m)    Weight:   Wt Readings from Last 1 Encounters:  09/17/19 (!) 152.9 kg    Ideal Body Weight:  89.1 kg  BMI:  Body mass index is 42.69 kg/m.  Estimated Nutritional Needs:   Kcal:  [address] Protein:  150-175 grams  Fluid:  1.5 L    JLoistine Chance RD, LDN, CNavy Yard CityRegistered Dietitian II Certified Diabetes Care and Education Specialist Please refer to AWilliam S Hall Psychiatric Institutefor RD and/or RD on-call/weekend/after hours pager

## 2019-09-17 NOTE — Plan of Care (Signed)
  Problem: Health Behavior/Discharge Planning: Goal: Ability to manage health-related needs will improve Outcome: Progressing   Problem: Clinical Measurements: Goal: Will remain free from infection Outcome: Progressing   Problem: Skin Integrity: Goal: Risk for impaired skin integrity will decrease Outcome: Progressing

## 2019-09-17 NOTE — Progress Notes (Signed)
PROGRESS NOTE        PATIENT DETAILS Name: Robert Sullivan Age: 54 y.o. Sex: male Date of Birth: Aug 20, 1965 Admit Date: 09/11/2019 Admitting Physician Orson Eva, MD [id number], Tenna Child, PA-C  Brief Narrative: Patient is a 54 y.o. male with history of Karlene Lineman cirrhosis-recent history of MSSA bacteremia-recurrent left gluteal abscess-s/p numerous readmissions/debridements-referred from GI office for evaluation of anasarca in the setting of decompensated liver cirrhosis.  Found to have rim enhancing lesion on CT abdomen on admission within the left gluteal soft tissue area-concerning for an abscess-however aspiration cultures are negative so far raising question of whether this was actually a seroma.  Significant events this admit: 3/5>> admit to [facility] from GI office for evaluation of anasarca 3/6>> ultrasound guided large-volume paracentesis (9 L) by IR 3/6>> ultrasound-guided aspiration of left gluteal abscess by IR 3/5>>TTE-EF 60-65% 3/5>>Lower Ext Doppler-no DVT  Antibiotics this admit: Vancomycin:3/4>>3/6 Unasyn:3/6>> 3/8  Prior relevant admissions: 05/12/19>>05/17/19: Admit for sepsis secondary to UTI, left hip hematoma due to fall. 06/10/2019>> 06/30/2019: Admit for MSSA bacteremia, left gluteal abscess 07/09/2019>> 07/25/2019: Admit by ortho for recurrent left hip abscess-s/p debridement x3  Prior relevant Procedures: 07/18/19>> repeat irrigation and debridement of left hip 07/16/19>> left hip debridement 07/12/19>> left hip debridement 06/16/19>> TEE-no vegetation 06/17/19>> ultrasound-guided left gluteal abscess drain placement by IR 07/09/19>> I&D of left hip abscess 06/25/19>> I&D of left gluteal abscess 06/24/19>> I&D of left gluteal abscess  Microbiology: 09/13/19>>left gluteus aspiration culture: Negative 09/12/19>>blood culture: Negative 07/16/19>> tissue left hip: Enterococcus faecalis, Finegoldia magna 07/09/19>>Tissue left hip: Enterobacter  cloacae 06/17/19>> left gluteal abscess: MSSA 06/10/19>> blood culture: MSSA 06/10/19>> urine culture: MSSA 05/12/19>> urine culture: E. Coli  Consults: IR ID Ortho  Subjective: Improving-still with some edema in the lower extremities.  Assessment/Plan: Anasarca/ascites/bilateral pleural effusion: Secondary to decompensated liver cirrhosis  UA without evidence of proteinuria, echo with preserved EF.  Underwent large-volume paracentesis (9 L) on 3/6.  Volume status continues to improve-I/O's not charted accurately I suspect-but weight decreased to 337 pounds (370 pounds on admission).  Although improved-still with volume overload on exam-continue IV Lasix and Aldactone.  Lower extremity Dopplers negative for DVT.  Portal hypertension seen on CT imaging: Will need EGD at some point to confirm portal hypertensive gastropathy/survey for varices.  Recurrent left gluteal abscess vs Seroma: S/p ultrasound-guided aspiration on 3/6-cultures negative so far.  Appreciate ID evaluation today-since cultures are negative-question whether CT/MRI findings were most suggestive of a seroma in the setting of anasarca rather than a abscess.  ID recommended that we discontinue Unasyn and follow cultures which are negative so far.  Hyperkalemia: Resolved  Hypokalemia: Continue to replete-increase Aldactone to 150 mg  DM-2 (A1c 4.6 on 3/5): CBG stable-no longer on Lantus-on SSI.  Recent Labs    09/16/19 2129 09/17/19 0637 09/17/19 1118  GLUCAP 108* 109* 116*   Atypical chest pain: Resolved-troponins negative.  Thrombocytopenia: Mild-secondary to hypersplenism in the setting of liver cirrhosis.  Anemia: Probably secondary to chronic liver disease.  Follow for now.  Obesity: Estimated body mass index is 42.69 kg/m as calculated from the following:   Height as of this encounter: 6' 2.5" (1.892 m).   Weight as of this encounter: 152.9 kg.    Diet: Diet Order            Diet Carb Modified Fluid  consistency: Thin; Room service appropriate? Yes;  Fluid restriction: 1500 mL Fluid  Diet effective now               DVT Prophylaxis: Prophylactic Lovenox   Code Status: Full code   Family Communication: Spouse at bedside on 3/9-we will reach out to family tomorrow  Disposition Plan: Home-probably next 1-2 days depending on diuresis.  Barriers to Discharge: Massive anarsarca needing IV diuretics-still with significant volume overload requiring IV diuretics  Antimicrobial agents: Anti-infectives (From admission, onward)   Start     Dose/Rate Route Frequency Ordered Stop   09/13/19 1800  Ampicillin-Sulbactam (UNASYN) 3 g in sodium chloride 0.9 % 100 mL IVPB  Status:  Discontinued     3 g 200 mL/hr over 30 Minutes Intravenous Every 6 hours 09/13/19 1621 09/15/19 1101   09/12/19 2000  vancomycin (VANCOREADY) IVPB 1500 mg/300 mL  Status:  Discontinued     1,500 mg 150 mL/hr over 120 Minutes Intravenous Every 8 hours 09/12/19 1037 09/13/19 1629   09/12/19 1045  vancomycin (VANCOREADY) IVPB 2000 mg/400 mL     2,000 mg 200 mL/hr over 120 Minutes Intravenous  Once 09/12/19 1037 09/12/19 1354      Time spent: 25 minutes-Greater than 50% of this time was spent in counseling, explanation of diagnosis, planning of further management, and coordination of care.  MEDICATIONS: Scheduled Meds: . enoxaparin (LOVENOX) injection  40 mg Subcutaneous Q24H  . feeding supplement (PRO-STAT SUGAR FREE 64)  30 mL Oral QID  . furosemide  40 mg Intravenous BID  . insulin aspart  0-9 Units Subcutaneous TID WC  . multivitamin with minerals  1 tablet Oral Daily  . potassium chloride  40 mEq Oral Daily  . sodium chloride flush  3 mL Intravenous Once  . spironolactone  100 mg Oral Daily   Continuous Infusions:  PRN Meds:.acetaminophen **OR** acetaminophen, ondansetron **OR** ondansetron (ZOFRAN) IV   PHYSICAL EXAM: Vital signs: Vitals:   09/16/19 1955 09/17/19 0514 09/17/19 0601 09/17/19 0757   BP: 129/81 125/69  123/68  Pulse: 82 82  88  Resp: 16 16  17   Temp: 98.2 F (36.8 C) 98.3 F (36.8 C)  98.1 F (36.7 C)  TempSrc: Oral Oral  Oral  SpO2: 100% 97%  98%  Weight:   (!) 152.9 kg   Height:       Filed Weights   09/15/19 0500 09/16/19 0613 09/17/19 0601  Weight: (!) 163.2 kg (!) 157.8 kg (!) 152.9 kg   Body mass index is 42.69 kg/m.   Gen Exam:Alert awake-not in any distress HEENT:atraumatic, normocephalic Chest: B/L clear to auscultation anteriorly CVS:S1S2 regular Abdomen:soft non tender, non distended Extremities:++ edema Neurology: Non focal Skin: no rash  I have personally reviewed following labs and imaging studies  LABORATORY DATA: CBC: Recent Labs  Lab 09/12/19 0744 09/13/19 0513 09/14/19 0353 09/15/19 0240 09/16/19 0346  WBC 3.9* 4.8 2.8* 3.3* 3.4*  NEUTROABS 2.3  --   --   --   --   HGB 10.5* 9.3* 8.2* 8.5* 9.2*  HCT 34.8* 30.8* 26.5* 27.1* 28.6*  MCV 83.5 82.8 82.3 79.7* 79.7*  PLT 125* 121* 96* 95* 96*    Basic Metabolic Panel: Recent Labs  Lab 09/13/19 0513 09/14/19 0353 09/15/19 0240 09/16/19 0346 09/17/19 0410  NA 140 144 141 140 141  K 5.6* 2.9* 3.2* 4.3 3.4*  CL 108 108 106 102 102  CO2 18* 26 27 27 29   GLUCOSE 100* 97 103* 94 115*  BUN 12 7 5* <5* 5*  CREATININE 0.66 0.66 0.62 0.78 0.73  CALCIUM 8.1* 8.1* 7.9* 8.2* 8.3*  MG  --   --   --  1.7  --     GFR: Estimated Creatinine Clearance: 169.9 mL/min (by C-G formula based on SCr of 0.73 mg/dL).  Liver Function Tests: Recent Labs  Lab 09/12/19 0744 09/14/19 0353 09/15/19 0240 09/16/19 0346 09/17/19 0410  AST 61* 43* 42* 69* 43*  ALT 21 15 14 14 14   ALKPHOS 126 78 78 81 87  BILITOT 1.1 1.0 1.0 1.2 1.0  PROT 6.7 5.2* 5.1* 5.2* 5.4*  ALBUMIN 2.3* 2.3* 2.1* 2.2* 2.1*   Recent Labs  Lab 09/12/19 0744  LIPASE 20   No results for input(s): AMMONIA in the last 168 hours.  Coagulation Profile: Recent Labs  Lab 09/14/19 0353  INR 1.6*    Cardiac  Enzymes: No results for input(s): CKTOTAL, CKMB, CKMBINDEX, TROPONINI in the last 168 hours.  BNP (last 3 results) No results for input(s): PROBNP in the last 8760 hours.  Lipid Profile: No results for input(s): CHOL, HDL, LDLCALC, TRIG, CHOLHDL, LDLDIRECT in the last 72 hours.  Thyroid Function Tests: No results for input(s): TSH, T4TOTAL, FREET4, T3FREE, THYROIDAB in the last 72 hours.  Anemia Panel: No results for input(s): VITAMINB12, FOLATE, FERRITIN, TIBC, IRON, RETICCTPCT in the last 72 hours.  Urine analysis:    Component Value Date/Time   COLORURINE AMBER (A) 09/12/2019 1057   APPEARANCEUR HAZY (A) 09/12/2019 1057   LABSPEC 1.030 09/12/2019 1057   PHURINE 5.0 09/12/2019 1057   GLUCOSEU NEGATIVE 09/12/2019 1057   HGBUR NEGATIVE 09/12/2019 1057   BILIRUBINUR NEGATIVE 09/12/2019 1057   KETONESUR NEGATIVE 09/12/2019 1057   PROTEINUR 30 (A) 09/12/2019 1057   NITRITE NEGATIVE 09/12/2019 1057   LEUKOCYTESUR NEGATIVE 09/12/2019 1057    Sepsis Labs: Lactic Acid, Venous    Component Value Date/Time   LATICACIDVEN 2.6 (HH) 06/10/2019 1345    MICROBIOLOGY: Recent Results (from the past 240 hour(s))  Culture, blood (single)     Status: None   Collection Time: 09/12/19 10:50 AM   Specimen: BLOOD RIGHT WRIST  Result Value Ref Range Status   Specimen Description BLOOD RIGHT WRIST  Final   Special Requests   Final    BOTTLES DRAWN AEROBIC AND ANAEROBIC Blood Culture adequate volume   Culture   Final    NO GROWTH 5 DAYS Performed at [facility], [place] [address], [place], [place] [address]    Report Status 09/17/2019 FINAL  Final  SARS CORONAVIRUS 2 (TAT 6-24 HRS) Nasopharyngeal Nasopharyngeal Swab     Status: None   Collection Time: 09/12/19 11:22 AM   Specimen: Nasopharyngeal Swab  Result Value Ref Range Status   SARS Coronavirus 2 NEGATIVE NEGATIVE Final    Comment: (NOTE) SARS-CoV-2 target nucleic acids are NOT DETECTED. The SARS-CoV-2 RNA is generally  detectable in upper and lower respiratory specimens during the acute phase of infection. Negative results do not preclude SARS-CoV-2 infection, do not rule out co-infections with other pathogens, and should not be used as the sole basis for treatment or other patient management decisions. Negative results must be combined with clinical observations, patient history, and epidemiological information. The expected result is Negative. Fact Sheet for Patients: SugarRoll.be Fact Sheet for Healthcare Providers: https://www.woods-mathews.com/ This test is not yet approved or cleared by the [place] FDA and  has been authorized for detection and/or diagnosis of SARS-CoV-2 by FDA under an Emergency Use Authorization (EUA). This EUA will remain  in effect (meaning this test can be used) for the duration of the COVID-19 declaration under Section 56 4(b)(1) of the Act, 21 U.S.C. section 360bbb-3(b)(1), unless the authorization is terminated or revoked sooner. Performed at [facility], [place] [address], [place], [place] [address]   Acid Fast Smear (AFB)     Status: None   Collection Time: 09/13/19 12:31 PM   Specimen: Soft Tissue Abscess  Result Value Ref Range Status   AFB Specimen Processing Concentration  Final   Acid Fast Smear Negative  Final    Comment: (NOTE) Performed At: [facility] [place], [place] [id number] Rush Farmer MD [id number]    Source (AFB) ABSCESS  Final    Comment: GLUTEAL Performed at [facility], [place] [address], [place], [place] [address]   Culture, body fluid-bottle     Status: None (Preliminary result)   Collection Time: 09/13/19 12:31 PM   Specimen: Abscess  Result Value Ref Range Status   Specimen Description ABSCESS  Final   Special Requests GLUTEAL  Final   Culture   Final    NO GROWTH 4 DAYS Performed at [facility], [address], [place], [place] [address]      Report Status PENDING  Incomplete  Gram stain     Status: None   Collection Time: 09/13/19 12:31 PM   Specimen: Abscess  Result Value Ref Range Status   Specimen Description ABSCESS  Final   Special Requests GLUTEAL  Final   Gram Stain   Final    CYTOSPIN SMEAR WBC PRESENT, PREDOMINANTLY PMN NO ORGANISMS SEEN Performed at [facility], [place] [address] [place], [place] [address]    Report Status 09/13/2019 FINAL  Final  Fungus culture, blood     Status: None (Preliminary result)   Collection Time: 09/13/19 12:31 PM   Specimen: Abscess  Result Value Ref Range Status   Specimen Description ABSCESS  Final   Special Requests GLUTEAL  Final   Culture   Final    NO GROWTH 4 DAYS Performed at [place] [address], [place], [place] [address]    Report Status PENDING  Incomplete  Culture, body fluid-bottle     Status: None (Preliminary result)   Collection Time: 09/13/19 12:31 PM   Specimen: Ascitic  Result Value Ref Range Status   Specimen Description ASCITIC  Final   Special Requests NONE  Final   Culture   Final    NO GROWTH 4 DAYS Performed at [facility] [address], [place], [place] [address]    Report Status PENDING  Incomplete  Gram stain     Status: None   Collection Time: 09/13/19 12:31 PM   Specimen: Ascitic  Result Value Ref Range Status   Specimen Description ASCITIC  Final   Special Requests NONE  Final   Gram Stain   Final    CYTOSPIN SMEAR WBC PRESENT, PREDOMINANTLY MONONUCLEAR NO ORGANISMS SEEN Performed at [facility], [place] [address] [place], [place] [address]    Report Status 09/13/2019 FINAL  Final    RADIOLOGY STUDIES/RESULTS: No results found.   LOS: 5 days   Oren Binet, MD  [facility]    To contact the attending provider between 7A-7P or the covering provider during after hours 7P-7A, please log into the web site [email or web address] and access using universal [place] password for that web  site. If you do not have the  password, please call the hospital operator.  09/17/2019, 2:11 PM

## 2019-09-18 ENCOUNTER — Other Ambulatory Visit: Payer: Self-pay | Admitting: Physician Assistant

## 2019-09-18 LAB — COMPREHENSIVE METABOLIC PANEL
ALT: 15 U/L (ref 0–44)
AST: 42 U/L — ABNORMAL HIGH (ref 15–41)
Albumin: 2.1 g/dL — ABNORMAL LOW (ref 3.5–5.0)
Alkaline Phosphatase: 89 U/L (ref 38–126)
Anion gap: 7 (ref 5–15)
BUN: 7 mg/dL (ref 6–20)
CO2: 30 mmol/L (ref 22–32)
Calcium: 8.1 mg/dL — ABNORMAL LOW (ref 8.9–10.3)
Chloride: 102 mmol/L (ref 98–111)
Creatinine, Ser: 0.9 mg/dL (ref 0.61–1.24)
GFR calc Af Amer: >60 mL/min (ref >60)
GFR calc non Af Amer: >60 mL/min (ref >60)
Glucose, Bld: 116 mg/dL — ABNORMAL HIGH (ref 70–99)
Potassium: 3.5 mmol/L (ref 3.5–5.1)
Sodium: 139 mmol/L (ref 135–145)
Total Bilirubin: 0.6 mg/dL (ref 0.3–1.2)
Total Protein: 5.5 g/dL — ABNORMAL LOW (ref 6.5–8.1)

## 2019-09-18 LAB — CULTURE, BODY FLUID W GRAM STAIN -BOTTLE: Culture: NO GROWTH

## 2019-09-18 LAB — SURGICAL PCR SCREEN
MRSA, PCR: NEGATIVE
Staphylococcus aureus: NEGATIVE

## 2019-09-18 LAB — GLUCOSE, CAPILLARY
Glucose-Capillary: 102 mg/dL — ABNORMAL HIGH (ref 70–99)
Glucose-Capillary: 121 mg/dL — ABNORMAL HIGH (ref 70–99)
Glucose-Capillary: 91 mg/dL (ref 70–99)
Glucose-Capillary: 103 mg/dL — ABNORMAL HIGH (ref 70–99)

## 2019-09-18 MED ORDER — VANCOMYCIN HCL 10 G IV SOLR
2500.0000 mg | INTRAVENOUS | Status: AC
  Administered 2019-09-18: 2500 mg via INTRAVENOUS
  Filled 2019-09-18: qty 2500

## 2019-09-18 MED ORDER — VANCOMYCIN HCL 2000 MG/400ML IV SOLN
2000.0000 mg | INTRAVENOUS | Status: DC
  Filled 2019-09-18: qty 400

## 2019-09-18 MED ORDER — SODIUM CHLORIDE 0.9 % IV SOLN
2.0000 g | INTRAVENOUS | Status: DC
  Administered 2019-09-18 – 2019-09-25 (×39): 2 g via INTRAVENOUS
  Filled 2019-09-18: qty 2
  Filled 2019-09-18 (×2): qty 2000
  Filled 2019-09-18: qty 2
  Filled 2019-09-18: qty 2000
  Filled 2019-09-18: qty 2
  Filled 2019-09-18 (×2): qty 2000
  Filled 2019-09-18: qty 2
  Filled 2019-09-18 (×4): qty 2000
  Filled 2019-09-18 (×3): qty 2
  Filled 2019-09-18: qty 2000
  Filled 2019-09-18: qty 2
  Filled 2019-09-18 (×2): qty 2000
  Filled 2019-09-18: qty 2
  Filled 2019-09-18: qty 2000
  Filled 2019-09-18 (×2): qty 2
  Filled 2019-09-18: qty 2000
  Filled 2019-09-18 (×3): qty 2
  Filled 2019-09-18 (×3): qty 2000
  Filled 2019-09-18: qty 2
  Filled 2019-09-18 (×2): qty 2000
  Filled 2019-09-18: qty 2
  Filled 2019-09-18: qty 2000
  Filled 2019-09-18: qty 2
  Filled 2019-09-18 (×2): qty 2000
  Filled 2019-09-18 (×2): qty 2
  Filled 2019-09-18: qty 2000
  Filled 2019-09-18 (×2): qty 2
  Filled 2019-09-18 (×2): qty 2000

## 2019-09-18 MED ORDER — FUROSEMIDE 40 MG PO TABS
40.0000 mg | ORAL_TABLET | Freq: Every day | ORAL | Status: DC
  Administered 2019-09-18 – 2019-09-19 (×2): 40 mg via ORAL
  Filled 2019-09-18 (×2): qty 1

## 2019-09-18 MED ORDER — DEXTROSE 5 % IV SOLN
3.0000 g | INTRAVENOUS | Status: AC
  Administered 2019-09-19: 3 g via INTRAVENOUS
  Filled 2019-09-18 (×2): qty 3000

## 2019-09-18 MED ORDER — VANCOMYCIN HCL 1750 MG/350ML IV SOLN
1750.0000 mg | Freq: Two times a day (BID) | INTRAVENOUS | Status: DC
  Filled 2019-09-18: qty 350

## 2019-09-18 MED ORDER — CHLORHEXIDINE GLUCONATE 4 % EX LIQD
60.0000 mL | Freq: Once | CUTANEOUS | Status: AC
  Administered 2019-09-19: 4 via TOPICAL
  Filled 2019-09-18: qty 60

## 2019-09-18 NOTE — Plan of Care (Signed)
  Problem: Education: Goal: Knowledge of General Education information will improve Description: Including pain rating scale, medication(s)/side effects and non-pharmacologic comfort measures Outcome: Progressing   Problem: Clinical Measurements: Goal: Will remain free from infection Outcome: Progressing   Problem: Skin Integrity: Goal: Risk for impaired skin integrity will decrease Outcome: Progressing

## 2019-09-18 NOTE — Plan of Care (Signed)
  Problem: Education: Goal: Knowledge of General Education information will improve Description: Including pain rating scale, medication(s)/side effects and non-pharmacologic comfort measures Outcome: Progressing   Problem: Clinical Measurements: Goal: Will remain free from infection Outcome: Not Progressing

## 2019-09-18 NOTE — Progress Notes (Signed)
[facility] for Infectious Disease    Date of Admission:  09/11/2019   Total days of antibiotics 2  ID: Robert Sullivan is a 54 y.o. male with large left gluteal fluid collection vs. abscess Active Problems:   Obesity, Class III, BMI 40-49.9 (morbid obesity) (HCC)   Anasarca   Cirrhosis of liver with ascites (HCC)   Abscess, gluteal, left   Pleural effusion   Thrombocytopenia (HCC)   Ascites    Subjective: Afebrile. But cx started to grow GPC on 5th day from initial aspirate of left gluteal fluid collection/abscess Medications:  . chlorhexidine  60 mL Topical Once  . enoxaparin (LOVENOX) injection  40 mg Subcutaneous Q24H  . feeding supplement (PRO-STAT SUGAR FREE 64)  30 mL Oral QID  . furosemide  40 mg Oral Daily  . insulin aspart  0-9 Units Subcutaneous TID WC  . multivitamin with minerals  1 tablet Oral Daily  . potassium chloride  40 mEq Oral Daily  . sodium chloride flush  3 mL Intravenous Once  . spironolactone  150 mg Oral Daily    Objective: Vital signs in last 24 hours: Temp:  [97.9 F (36.6 C)-98.6 F (37 C)] 97.9 F (36.6 C) (03/11 1311) Pulse Rate:  [83-90] 86 (03/11 1311) Resp:  [17-18] 18 (03/11 1311) BP: (104-130)/(64-73) 119/65 (03/11 1311) SpO2:  [95 %-98 %] 97 % (03/11 1311) Weight:  [150.6 kg] 150.6 kg (03/11 0500) Physical Exam  Constitutional: He is oriented to person, place, and time. He appears chronically ill and well-nourished. No distress.  HENT:  Mouth/Throat: Oropharynx is clear and moist. No oropharyngeal exudate.  Cardiovascular: Normal rate, regular rhythm and normal heart sounds. Exam reveals no gallop and no friction rub.  No murmur heard.  Pulmonary/Chest: Effort normal and breath sounds normal. No respiratory distress. He has no wheezes.  Abdominal:protuberant, obese contour. Bowel sounds are normal. He exhibits no distension. There is no tenderness.  Lymphadenopathy:  He has no cervical adenopathy.  Neurological: He is alert  and oriented to person, place, and time.  Ext:+2 pitting edema Skin: Skin is warm and dry. No rash noted. No erythema.  Psychiatric: He has a normal mood and affect. His behavior is normal.     Lab Results Recent Labs    09/16/19 0346 09/16/19 0346 09/17/19 0410 09/18/19 0509  WBC 3.4*  --   --   --   HGB 9.2*  --   --   --   HCT 28.6*  --   --   --   NA 140   < > 141 139  K 4.3   < > 3.4* 3.5  CL 102   < > 102 102  CO2 27   < > 29 30  BUN <5*   < > 5* 7  CREATININE 0.78   < > 0.73 0.90   < > = values in this interval not displayed.   Liver Panel Recent Labs    09/17/19 0410 09/18/19 0509  PROT 5.4* 5.5*  ALBUMIN 2.1* 2.1*  AST 43* 42*  ALT 14 15  ALKPHOS 87 89  BILITOT 1.0 0.6    Microbiology: 3/6 enterococcus faecalis Studies/Results: No results found.   Assessment/Plan: Enterococcal gluteal abscess = to undergo I x D tomorrow, will change vancomycin to ampicillin. Likely same isolate that was previously identified in his wound in January. Will need iv abtx x 3 wk then can switch to oral abtx. Recommend PICC line  [facility]  for Infectious Diseases Cell: [phone] Pager: [phone]  09/18/2019, 5:54 PM

## 2019-09-18 NOTE — Progress Notes (Signed)
Pharmacy Antibiotic Note  Robert Sullivan is a 54 y.o. male admitted on 09/11/2019 with cellulitis.  He has recent history of MSSA bacteremia-recurrent left gluteal abscess-s/p numerous readmissions/debridements. On 09/18/19 Pharmacy consulted for vancomycin dosing for recurrent gluteal effusion Gm + cocci in abscess cx Afeberile, WBC  3.4.   Scr 0.9,  Obesity BMI 42 He was previously on IV Vancomycin 3/5>3/6, last received vanc 1574m dose 3/6 @1330 .   Renal function good, CrCl >100 ml/min,  UOP 3050 ml (0.8 ml/kg/hr).   Plan: Vancomycin 2500 mg IV x 1, then 1750 mg IV every 12 hours Goal AUC 400-550. Expected AUC: 472 SCr used: 0.9 Monitor renal function, clinical progression and LOT Vancomycin levels at steady state  Height: 6' 2.5" (189.2 cm) Weight: (!) 332 lb 1.6 oz (150.6 kg) IBW/kg (Calculated) : 83.35  Temp (24hrs), Avg:98.1 F (36.7 C), Min:97.8 F (36.6 C), Max:98.6 F (37 C)  Recent Labs  Lab 09/12/19 0744 09/12/19 0744 09/13/19 0513 09/13/19 0513 09/14/19 0353 09/15/19 0240 09/16/19 0346 09/17/19 0410 09/18/19 0509  WBC 3.9*  --  4.8  --  2.8* 3.3* 3.4*  --   --   CREATININE 0.72   < > 0.66   < > 0.66 0.62 0.78 0.73 0.90   < > = values in this interval not displayed.    Estimated Creatinine Clearance: 149.8 mL/min (by C-G formula based on SCr of 0.9 mg/dL).    Antimicrobials:  Vanc 3/5>>3/6;  3/11>> ULisabeth Register3/6>>3/8  Microbiology: 3/5 covid: neg 3/5 BCx: negativeF 3/6 Abscess Gram stain: no organisms 3/6 Ascitic Gram stain: no organisms 3/6  gluteal Abscess cx : Enterococcus faecalis : pending 3/6 Abscess fungal cx: NGTD 3/6 ascitic fluid cx: NegativeF  RNicole Sullivan RPh Clinical Pharmacist  [phone] check AMION for all [place] numbers After 10:00 PM, call [phone]/05/2020 10:05 AM

## 2019-09-18 NOTE — Progress Notes (Signed)
Patient ID: Robert Sullivan, male   DOB: 09/28/1965, 54 y.o.   MRN: [id number] Patient is asymptomatic this morning.  His aerobic cultures are negative for 4 days.  There is a gram-positive cocci that is growing in the anaerobic bottle.  I have contacted microbiology lab and they state that there is a gram-positive aerobic culture growing on the plate.  I will plan for repeat irrigation and debridement of the left hip tomorrow Friday.

## 2019-09-18 NOTE — Progress Notes (Addendum)
Call from microbiology with 09/13/19 anaerobic gram stain results of positive cocci. MD made aware.

## 2019-09-18 NOTE — Progress Notes (Addendum)
PROGRESS NOTE        PATIENT DETAILS Name: Robert Sullivan Age: 54 y.o. Sex: male Date of Birth: 07/16/1965 Admit Date: 09/11/2019 Admitting Physician Orson Eva, MD QMG:NOIBBCWU, Tenna Child, PA-C  Brief Narrative: Patient is a 54 y.o. male with history of Karlene Lineman cirrhosis-recent history of MSSA bacteremia-recurrent left gluteal abscess-s/p numerous readmissions/debridements-referred from GI office for evaluation of anasarca in the setting of decompensated liver cirrhosis.  Found to have rim enhancing lesion on CT abdomen on admission within the left gluteal soft tissue area-concerning for an abscess-however aspiration cultures are negative so far raising question of whether this was actually a seroma.  Significant events this admit: 3/5>> admit to [facility] from GI office for evaluation of anasarca 3/6>> ultrasound guided large-volume paracentesis (9 L) by IR 3/6>> ultrasound-guided aspiration of left gluteal abscess by IR 3/5>>TTE-EF 60-65% 3/5>>Lower Ext Doppler-no DVT  Antibiotics this admit: Vancomycin:3/11>> Vancomycin:3/4>>3/6 Unasyn:3/6>> 3/8  Prior relevant admissions: 05/12/19>>05/17/19: Admit for sepsis secondary to UTI, left hip hematoma due to fall. 06/10/2019>> 06/30/2019: Admit for MSSA bacteremia, left gluteal abscess 07/09/2019>> 07/25/2019: Admit by ortho for recurrent left hip abscess-s/p debridement x3  Prior relevant Procedures: 07/18/19>> repeat irrigation and debridement of left hip 07/16/19>> left hip debridement 07/12/19>> left hip debridement 06/16/19>> TEE-no vegetation 06/17/19>> ultrasound-guided left gluteal abscess drain placement by IR 07/09/19>> I&D of left hip abscess 06/25/19>> I&D of left gluteal abscess 06/24/19>> I&D of left gluteal abscess  Microbiology: 09/13/19>>left gluteus aspiration culture: gm +ve cocci 09/12/19>>blood culture: Negative 07/16/19>> tissue left hip: Enterococcus faecalis, Finegoldia magna 07/09/19>>Tissue left hip:  Enterobacter cloacae 06/17/19>> left gluteal abscess: MSSA 06/10/19>> blood culture: MSSA 06/10/19>> urine culture: MSSA 05/12/19>> urine culture: E. Coli  Consults: IR ID Ortho  Subjective: Improving-still with some edema in the lower extremities.  Assessment/Plan: Anasarca/ascites/bilateral pleural effusion: Secondary to decompensated liver cirrhosis  UA without evidence of proteinuria, echo with preserved EF.  Underwent large-volume paracentesis (9 L) on 3/6.  Volume status continues to improve-I/O's not charted accurately I suspect-but weight decreased to 332 pounds (370 pounds on admission).  Although volume status improved-he still has some mild volume overload-but suspect we could now transition to oral Lasix and Aldactone.  Lower extremity Dopplers negative for DVT.  Portal hypertension seen on CT imaging: Will need EGD at some point to confirm portal hypertensive gastropathy/survey for varices.  Recurrent left gluteal abscess: S/p ultrasound-guided aspiration on 3/6-cultures had been negative for the past several days-but now positive for gram-positive cocci-have restarted IV vancomycin.  Orthopedics planning repeat irrigation and debridement on 3/12-have spoken with Dr. Liam Rogers will follow her again.  Hyperkalemia: Resolved  Hypokalemia: Stable.  Follow.  DM-2 (A1c 4.6 on 3/5): CBG stable-no longer on Lantus-on SSI.  Recent Labs    09/17/19 2027 09/18/19 0651 09/18/19 1149  GLUCAP 117* 102* 121*   Atypical chest pain: Resolved-troponins negative.  Thrombocytopenia: Mild-secondary to hypersplenism in the setting of liver cirrhosis.  Anemia: Probably secondary to chronic liver disease.  Follow for now.  Obesity: Estimated body mass index is 42.07 kg/m as calculated from the following:   Height as of this encounter: 6' 2.5" (1.892 m).   Weight as of this encounter: 150.6 kg.    Diet: Diet Order            Diet Carb Modified Fluid consistency: Thin; Room service  appropriate? Yes; Fluid restriction: 1500 mL Fluid  Diet effective now               DVT Prophylaxis: Prophylactic Lovenox   Code Status: Full code   Family Communication: Spouse at bedside on 3/9-we will reach out to family tomorrow  Disposition Plan: Home  Barriers to Discharge: Left gluteal abscess needing debridement on 3/12-on IV antibiotics  Antimicrobial agents: Anti-infectives (From admission, onward)   Start     Dose/Rate Route Frequency Ordered Stop   09/19/19 0000  vancomycin (VANCOREADY) IVPB 1750 mg/350 mL     1,750 mg 175 mL/hr over 120 Minutes Intravenous Every 12 hours 09/18/19 1019     09/18/19 1100  vancomycin (VANCOREADY) IVPB 2000 mg/400 mL  Status:  Discontinued     2,000 mg 200 mL/hr over 120 Minutes Intravenous STAT 09/18/19 1009 09/18/19 1013   09/18/19 1100  vancomycin (VANCOCIN) 2,500 mg in sodium chloride 0.9 % 500 mL IVPB     2,500 mg 250 mL/hr over 120 Minutes Intravenous STAT 09/18/19 1013 09/18/19 1354   09/13/19 1800  Ampicillin-Sulbactam (UNASYN) 3 g in sodium chloride 0.9 % 100 mL IVPB  Status:  Discontinued     3 g 200 mL/hr over 30 Minutes Intravenous Every 6 hours 09/13/19 1621 09/15/19 1101   09/12/19 2000  vancomycin (VANCOREADY) IVPB 1500 mg/300 mL  Status:  Discontinued     1,500 mg 150 mL/hr over 120 Minutes Intravenous Every 8 hours 09/12/19 1037 09/13/19 1629   09/12/19 1045  vancomycin (VANCOREADY) IVPB 2000 mg/400 mL     2,000 mg 200 mL/hr over 120 Minutes Intravenous  Once 09/12/19 1037 09/12/19 1354      Time spent: 25 minutes-Greater than 50% of this time was spent in counseling, explanation of diagnosis, planning of further management, and coordination of care.  MEDICATIONS: Scheduled Meds: . enoxaparin (LOVENOX) injection  40 mg Subcutaneous Q24H  . feeding supplement (PRO-STAT SUGAR FREE 64)  30 mL Oral QID  . furosemide  40 mg Intravenous BID  . insulin aspart  0-9 Units Subcutaneous TID WC  . multivitamin  with minerals  1 tablet Oral Daily  . potassium chloride  40 mEq Oral Daily  . sodium chloride flush  3 mL Intravenous Once  . spironolactone  150 mg Oral Daily   Continuous Infusions: . [START ON 09/19/2019] vancomycin     PRN Meds:.acetaminophen **OR** acetaminophen, ondansetron **OR** ondansetron (ZOFRAN) IV   PHYSICAL EXAM: Vital signs: Vitals:   09/18/19 0432 09/18/19 0500 09/18/19 0845 09/18/19 1311  BP: 130/69  126/64 119/65  Pulse: 90  83 86  Resp: 17  18 18   Temp: 97.9 F (36.6 C)  98.1 F (36.7 C) 97.9 F (36.6 C)  TempSrc: Oral  Oral Oral  SpO2: 95%  97% 97%  Weight:  (!) 150.6 kg    Height:       Filed Weights   09/16/19 0613 09/17/19 0601 09/18/19 0500  Weight: (!) 157.8 kg (!) 152.9 kg (!) 150.6 kg   Body mass index is 42.07 kg/m.   Gen Exam:Alert awake-not in any distress HEENT:atraumatic, normocephalic Chest: B/L clear to auscultation anteriorly CVS:S1S2 regular Abdomen:soft non tender, non distended Extremities:+ edema Neurology: Non focal Skin: no rash  I have personally reviewed following labs and imaging studies  LABORATORY DATA: CBC: Recent Labs  Lab 09/12/19 0744 09/13/19 0513 09/14/19 0353 09/15/19 0240 09/16/19 0346  WBC 3.9* 4.8 2.8* 3.3* 3.4*  NEUTROABS 2.3  --   --   --   --   HGB  10.5* 9.3* 8.2* 8.5* 9.2*  HCT 34.8* 30.8* 26.5* 27.1* 28.6*  MCV 83.5 82.8 82.3 79.7* 79.7*  PLT 125* 121* 96* 95* 96*    Basic Metabolic Panel: Recent Labs  Lab 09/14/19 0353 09/15/19 0240 09/16/19 0346 09/17/19 0410 09/18/19 0509  NA 144 141 140 141 139  K 2.9* 3.2* 4.3 3.4* 3.5  CL 108 106 102 102 102  CO2 26 27 27 29 30   GLUCOSE 97 103* 94 115* 116*  BUN 7 5* <5* 5* 7  CREATININE 0.66 0.62 0.78 0.73 0.90  CALCIUM 8.1* 7.9* 8.2* 8.3* 8.1*  MG  --   --  1.7  --   --     GFR: Estimated Creatinine Clearance: 149.8 mL/min (by C-G formula based on SCr of 0.9 mg/dL).  Liver Function Tests: Recent Labs  Lab 09/14/19 0353  09/15/19 0240 09/16/19 0346 09/17/19 0410 09/18/19 0509  AST 43* 42* 69* 43* 42*  ALT 15 14 14 14 15   ALKPHOS 78 78 81 87 89  BILITOT 1.0 1.0 1.2 1.0 0.6  PROT 5.2* 5.1* 5.2* 5.4* 5.5*  ALBUMIN 2.3* 2.1* 2.2* 2.1* 2.1*   Recent Labs  Lab 09/12/19 0744  LIPASE 20   No results for input(s): AMMONIA in the last 168 hours.  Coagulation Profile: Recent Labs  Lab 09/14/19 0353  INR 1.6*    Cardiac Enzymes: No results for input(s): CKTOTAL, CKMB, CKMBINDEX, TROPONINI in the last 168 hours.  BNP (last 3 results) No results for input(s): PROBNP in the last 8760 hours.  Lipid Profile: No results for input(s): CHOL, HDL, LDLCALC, TRIG, CHOLHDL, LDLDIRECT in the last 72 hours.  Thyroid Function Tests: No results for input(s): TSH, T4TOTAL, FREET4, T3FREE, THYROIDAB in the last 72 hours.  Anemia Panel: No results for input(s): VITAMINB12, FOLATE, FERRITIN, TIBC, IRON, RETICCTPCT in the last 72 hours.  Urine analysis:    Component Value Date/Time   COLORURINE AMBER (A) 09/12/2019 1057   APPEARANCEUR HAZY (A) 09/12/2019 1057   LABSPEC 1.030 09/12/2019 1057   PHURINE 5.0 09/12/2019 1057   GLUCOSEU NEGATIVE 09/12/2019 1057   HGBUR NEGATIVE 09/12/2019 1057   BILIRUBINUR NEGATIVE 09/12/2019 1057   KETONESUR NEGATIVE 09/12/2019 1057   PROTEINUR 30 (A) 09/12/2019 1057   NITRITE NEGATIVE 09/12/2019 1057   LEUKOCYTESUR NEGATIVE 09/12/2019 1057    Sepsis Labs: Lactic Acid, Venous    Component Value Date/Time   LATICACIDVEN 2.6 (HH) 06/10/2019 1345    MICROBIOLOGY: Recent Results (from the past 240 hour(s))  Culture, blood (single)     Status: None   Collection Time: 09/12/19 10:50 AM   Specimen: BLOOD RIGHT WRIST  Result Value Ref Range Status   Specimen Description BLOOD RIGHT WRIST  Final   Special Requests   Final    BOTTLES DRAWN AEROBIC AND ANAEROBIC Blood Culture adequate volume   Culture   Final    NO GROWTH 5 DAYS Performed at [facility], [place] [address], [place], [place] [address]    Report Status 09/17/2019 FINAL  Final  SARS CORONAVIRUS 2 (TAT 6-24 HRS) Nasopharyngeal Nasopharyngeal Swab     Status: None   Collection Time: 09/12/19 11:22 AM   Specimen: Nasopharyngeal Swab  Result Value Ref Range Status   SARS Coronavirus 2 NEGATIVE NEGATIVE Final    Comment: (NOTE) SARS-CoV-2 target nucleic acids are NOT DETECTED. The SARS-CoV-2 RNA is generally detectable in upper and lower respiratory specimens during the acute phase of infection. Negative results do not preclude SARS-CoV-2 infection,  do not rule out co-infections with other pathogens, and should not be used as the sole basis for treatment or other patient management decisions. Negative results must be combined with clinical observations, patient history, and epidemiological information. The expected result is Negative. Fact Sheet for Patients: SugarRoll.be Fact Sheet for Healthcare Providers: https://www.woods-mathews.com/ This test is not yet approved or cleared by the [place] FDA and  has been authorized for detection and/or diagnosis of SARS-CoV-2 by FDA under an Emergency Use Authorization (EUA). This EUA will remain  in effect (meaning this test can be used) for the duration of the COVID-19 declaration under Section 56 4(b)(1) of the Act, 21 U.S.C. section 360bbb-3(b)(1), unless the authorization is terminated or revoked sooner. Performed at [facility], [place] [address], [place], [place] [address]   Acid Fast Smear (AFB)     Status: None   Collection Time: 09/13/19 12:31 PM   Specimen: Soft Tissue Abscess  Result Value Ref Range Status   AFB Specimen Processing Concentration  Final   Acid Fast Smear Negative  Final    Comment: (NOTE) Performed At: [facility] [place], [place] [id number] Rush Farmer MD DG:[id number]    Source (AFB) ABSCESS  Final    Comment: GLUTEAL Performed at  [facility], [place] [address] [place], [place] [address]   Culture, body fluid-bottle     Status: Abnormal (Preliminary result)   Collection Time: 09/13/19 12:31 PM   Specimen: Abscess  Result Value Ref Range Status   Specimen Description ABSCESS  Final   Special Requests GLUTEAL  Final   Gram Stain   Final    ANAEROBIC BOTTLE ONLY GRAM POSITIVE COCCI CRITICAL RESULT CALLED TO, READ BACK BY AND VERIFIED WITH: A NEVIUS RN 09/18/19 0101 JDW    Culture (A)  Final    ENTEROCOCCUS FAECALIS SUSCEPTIBILITIES TO FOLLOW Performed at [facility], [place] [address] [place], [place] [address]    Report Status PENDING  Incomplete  Gram stain     Status: None   Collection Time: 09/13/19 12:31 PM   Specimen: Abscess  Result Value Ref Range Status   Specimen Description ABSCESS  Final   Special Requests GLUTEAL  Final   Gram Stain   Final    CYTOSPIN SMEAR WBC PRESENT, PREDOMINANTLY PMN NO ORGANISMS SEEN Performed at [facility], [place] [address], [place], [place] [address]    Report Status 09/13/2019 FINAL  Final  Fungus culture, blood     Status: None (Preliminary result)   Collection Time: 09/13/19 12:31 PM   Specimen: Abscess  Result Value Ref Range Status   Specimen Description ABSCESS  Final   Special Requests GLUTEAL  Final   Culture   Final    NO GROWTH 5 DAYS Performed at [place] [address] [place], [place] [address]    Report Status PENDING  Incomplete  Culture, body fluid-bottle     Status: None   Collection Time: 09/13/19 12:31 PM   Specimen: Ascitic  Result Value Ref Range Status   Specimen Description ASCITIC  Final   Special Requests NONE  Final   Culture   Final    NO GROWTH 5 DAYS Performed at [facility], [address], [place], [place] [address]    Report Status 09/18/2019 FINAL  Final  Gram stain     Status: None   Collection Time: 09/13/19 12:31 PM   Specimen: Ascitic  Result Value  Ref Range Status   Specimen  Description ASCITIC  Final   Special Requests NONE  Final   Gram Stain   Final    CYTOSPIN SMEAR WBC PRESENT, PREDOMINANTLY MONONUCLEAR NO ORGANISMS SEEN Performed at [facility], [place] [address] [place], [place] [address]    Report Status 09/13/2019 FINAL  Final    RADIOLOGY STUDIES/RESULTS: No results found.   LOS: 6 days   Oren Binet, MD  [facility]    To contact the attending provider between 7A-7P or the covering provider during after hours 7P-7A, please log into the web site [email or web address] and access using universal [facility] password for that web site. If you do not have the password, please call the hospital operator.  09/18/2019, 3:12 PM

## 2019-09-18 NOTE — H&P (View-Only) (Signed)
Patient ID: Robert Sullivan, male   DOB: 09-01-1965, 54 y.o.   MRN: [id number] Patient is asymptomatic this morning.  His aerobic cultures are negative for 4 days.  There is a gram-positive cocci that is growing in the anaerobic bottle.  I have contacted microbiology lab and they state that there is a gram-positive aerobic culture growing on the plate.  I will plan for repeat irrigation and debridement of the left hip tomorrow Friday.

## 2019-09-19 ENCOUNTER — Inpatient Hospital Stay: Payer: Self-pay

## 2019-09-19 ENCOUNTER — Inpatient Hospital Stay (HOSPITAL_COMMUNITY): Payer: Self-pay | Admitting: Certified Registered"

## 2019-09-19 ENCOUNTER — Encounter (HOSPITAL_COMMUNITY): Payer: Self-pay | Admitting: Internal Medicine

## 2019-09-19 ENCOUNTER — Encounter (HOSPITAL_COMMUNITY)
Admission: EM | Disposition: A | Discharge status: Home Health | Payer: Self-pay | Source: Home / Self Care | Attending: Internal Medicine

## 2019-09-19 HISTORY — PX: I & D EXTREMITY: SHX5045

## 2019-09-19 LAB — CBC
HCT: 30.3 % — ABNORMAL LOW (ref 39.0–52.0)
HCT: 25.8 % — ABNORMAL LOW (ref 39.0–52.0)
Hemoglobin: 9.3 g/dL — ABNORMAL LOW (ref 13.0–17.0)
Hemoglobin: 8.1 g/dL — ABNORMAL LOW (ref 13.0–17.0)
MCH: 25.2 pg — ABNORMAL LOW (ref 26.0–34.0)
MCH: 25.3 pg — ABNORMAL LOW (ref 26.0–34.0)
MCHC: 30.7 g/dL (ref 30.0–36.0)
MCHC: 31.4 g/dL (ref 30.0–36.0)
MCV: 82.1 fL (ref 80.0–100.0)
MCV: 80.6 fL (ref 80.0–100.0)
Platelets: 90 10*3/uL — ABNORMAL LOW (ref 150–400)
Platelets: 186 10*3/uL (ref 150–400)
RBC: 3.69 MIL/uL — ABNORMAL LOW (ref 4.22–5.81)
RBC: 3.2 MIL/uL — ABNORMAL LOW (ref 4.22–5.81)
RDW: 16.4 % — ABNORMAL HIGH (ref 11.5–15.5)
RDW: 16.6 % — ABNORMAL HIGH (ref 11.5–15.5)
WBC: 4.1 10*3/uL (ref 4.0–10.5)
WBC: 11.3 10*3/uL — ABNORMAL HIGH (ref 4.0–10.5)
nRBC: 0 % (ref 0.0–0.2)
nRBC: 0 % (ref 0.0–0.2)

## 2019-09-19 LAB — COMPREHENSIVE METABOLIC PANEL
ALT: 12 U/L (ref 0–44)
ALT: 15 U/L (ref 0–44)
AST: 40 U/L (ref 15–41)
AST: 47 U/L — ABNORMAL HIGH (ref 15–41)
Albumin: 2.1 g/dL — ABNORMAL LOW (ref 3.5–5.0)
Albumin: 2 g/dL — ABNORMAL LOW (ref 3.5–5.0)
Alkaline Phosphatase: 84 U/L (ref 38–126)
Alkaline Phosphatase: 81 U/L (ref 38–126)
Anion gap: 9 (ref 5–15)
Anion gap: 8 (ref 5–15)
BUN: 10 mg/dL (ref 6–20)
BUN: 15 mg/dL (ref 6–20)
CO2: 29 mmol/L (ref 22–32)
CO2: 25 mmol/L (ref 22–32)
Calcium: 8.2 mg/dL — ABNORMAL LOW (ref 8.9–10.3)
Calcium: 7.7 mg/dL — ABNORMAL LOW (ref 8.9–10.3)
Chloride: 101 mmol/L (ref 98–111)
Chloride: 102 mmol/L (ref 98–111)
Creatinine, Ser: 0.75 mg/dL (ref 0.61–1.24)
Creatinine, Ser: 0.94 mg/dL (ref 0.61–1.24)
GFR calc Af Amer: >60 mL/min (ref >60)
GFR calc Af Amer: >60 mL/min (ref >60)
GFR calc non Af Amer: >60 mL/min (ref >60)
GFR calc non Af Amer: >60 mL/min (ref >60)
Glucose, Bld: 123 mg/dL — ABNORMAL HIGH (ref 70–99)
Glucose, Bld: 171 mg/dL — ABNORMAL HIGH (ref 70–99)
Potassium: 3.6 mmol/L (ref 3.5–5.1)
Potassium: 4 mmol/L (ref 3.5–5.1)
Sodium: 139 mmol/L (ref 135–145)
Sodium: 135 mmol/L (ref 135–145)
Total Bilirubin: 0.8 mg/dL (ref 0.3–1.2)
Total Bilirubin: 1.3 mg/dL — ABNORMAL HIGH (ref 0.3–1.2)
Total Protein: 5.3 g/dL — ABNORMAL LOW (ref 6.5–8.1)
Total Protein: 5.1 g/dL — ABNORMAL LOW (ref 6.5–8.1)

## 2019-09-19 LAB — MAGNESIUM: Magnesium: 2 mg/dL (ref 1.7–2.4)

## 2019-09-19 LAB — GLUCOSE, CAPILLARY
Glucose-Capillary: 103 mg/dL — ABNORMAL HIGH (ref 70–99)
Glucose-Capillary: 103 mg/dL — ABNORMAL HIGH (ref 70–99)
Glucose-Capillary: 108 mg/dL — ABNORMAL HIGH (ref 70–99)
Glucose-Capillary: 141 mg/dL — ABNORMAL HIGH (ref 70–99)
Glucose-Capillary: 154 mg/dL — ABNORMAL HIGH (ref 70–99)

## 2019-09-19 LAB — CULTURE, BODY FLUID W GRAM STAIN -BOTTLE

## 2019-09-19 SURGERY — IRRIGATION AND DEBRIDEMENT EXTREMITY
Anesthesia: General | Laterality: Left

## 2019-09-19 MED ORDER — OXYCODONE HCL 5 MG PO TABS
10.0000 mg | ORAL_TABLET | ORAL | Status: DC | PRN
  Administered 2019-09-19 – 2019-09-24 (×9): 10 mg via ORAL
  Filled 2019-09-19 (×9): qty 2

## 2019-09-19 MED ORDER — FENTANYL CITRATE (PF) 250 MCG/5ML IJ SOLN
INTRAMUSCULAR | Status: DC | PRN
  Administered 2019-09-19: 100 ug via INTRAVENOUS

## 2019-09-19 MED ORDER — PHENYLEPHRINE HCL-NACL 10-0.9 MG/250ML-% IV SOLN
INTRAVENOUS | Status: DC | PRN
  Administered 2019-09-19: 10 ug/min via INTRAVENOUS

## 2019-09-19 MED ORDER — FENTANYL CITRATE (PF) 100 MCG/2ML IJ SOLN: 25.0000 ug | INTRAMUSCULAR | Status: DC | PRN

## 2019-09-19 MED ORDER — SUGAMMADEX SODIUM 200 MG/2ML IV SOLN
INTRAVENOUS | Status: DC | PRN
  Administered 2019-09-19: 400 mg via INTRAVENOUS

## 2019-09-19 MED ORDER — METOCLOPRAMIDE HCL 5 MG/ML IJ SOLN: 5.0000 mg | Freq: Three times a day (TID) | INTRAMUSCULAR | Status: DC | PRN

## 2019-09-19 MED ORDER — FENTANYL CITRATE (PF) 250 MCG/5ML IJ SOLN
INTRAMUSCULAR | Status: AC
  Filled 2019-09-19: qty 5

## 2019-09-19 MED ORDER — PROPOFOL 10 MG/ML IV BOLUS
INTRAVENOUS | Status: DC | PRN
  Administered 2019-09-19: 150 mg via INTRAVENOUS

## 2019-09-19 MED ORDER — ACETAMINOPHEN 500 MG PO TABS: 1000.0000 mg | ORAL_TABLET | Freq: Once | ORAL | Status: DC

## 2019-09-19 MED ORDER — HYDROMORPHONE HCL 1 MG/ML IJ SOLN: 0.5000 mg | INTRAMUSCULAR | Status: DC | PRN

## 2019-09-19 MED ORDER — SODIUM CHLORIDE 0.9 % IV SOLN: INTRAVENOUS | Status: DC

## 2019-09-19 MED ORDER — SPIRONOLACTONE 25 MG PO TABS
200.0000 mg | ORAL_TABLET | Freq: Every day | ORAL | Status: DC
  Administered 2019-09-21 – 2019-09-22 (×2): 200 mg via ORAL
  Filled 2019-09-19 (×4): qty 8

## 2019-09-19 MED ORDER — METOCLOPRAMIDE HCL 5 MG PO TABS: 5.0000 mg | ORAL_TABLET | Freq: Three times a day (TID) | ORAL | Status: DC | PRN

## 2019-09-19 MED ORDER — LACTATED RINGERS IV SOLN: INTRAVENOUS | Status: DC

## 2019-09-19 MED ORDER — 0.9 % SODIUM CHLORIDE (POUR BTL) OPTIME
TOPICAL | Status: DC | PRN
  Administered 2019-09-19: 1000 mL

## 2019-09-19 MED ORDER — ROCURONIUM BROMIDE 50 MG/5ML IV SOSY
PREFILLED_SYRINGE | INTRAVENOUS | Status: DC | PRN
  Administered 2019-09-19: 100 mg via INTRAVENOUS

## 2019-09-19 MED ORDER — LIDOCAINE 2% (20 MG/ML) 5 ML SYRINGE
INTRAMUSCULAR | Status: DC | PRN
  Administered 2019-09-19: 100 mg via INTRAVENOUS

## 2019-09-19 MED ORDER — DOCUSATE SODIUM 100 MG PO CAPS
100.0000 mg | ORAL_CAPSULE | Freq: Two times a day (BID) | ORAL | Status: DC
  Filled 2019-09-19 (×3): qty 1

## 2019-09-19 MED ORDER — PHENYLEPHRINE 40 MCG/ML (10ML) SYRINGE FOR IV PUSH (FOR BLOOD PRESSURE SUPPORT)
PREFILLED_SYRINGE | INTRAVENOUS | Status: DC | PRN
  Administered 2019-09-19: 120 ug via INTRAVENOUS

## 2019-09-19 SURGICAL SUPPLY — 35 items
BLADE SURG 21 STRL SS (BLADE) ×3 IMPLANT
BNDG COHESIVE 6X5 TAN STRL LF (GAUZE/BANDAGES/DRESSINGS) IMPLANT
BNDG GAUZE ELAST 4 BULKY (GAUZE/BANDAGES/DRESSINGS) ×6 IMPLANT
CANISTER WOUNDNEG PRESSURE 500 (CANNISTER) ×2 IMPLANT
COVER SURGICAL LIGHT HANDLE (MISCELLANEOUS) ×6 IMPLANT
COVER WAND RF STERILE (DRAPES) ×3 IMPLANT
DRAPE U-SHAPE 47X51 STRL (DRAPES) ×3 IMPLANT
DRESSING PREVENA PLUS CUSTOM (GAUZE/BANDAGES/DRESSINGS) IMPLANT
DRSG ADAPTIC 3X8 NADH LF (GAUZE/BANDAGES/DRESSINGS) ×3 IMPLANT
DRSG PREVENA PLUS CUSTOM (GAUZE/BANDAGES/DRESSINGS) ×3
DURAPREP 26ML APPLICATOR (WOUND CARE) ×3 IMPLANT
ELECT REM PT RETURN 9FT ADLT (ELECTROSURGICAL)
ELECTRODE REM PT RTRN 9FT ADLT (ELECTROSURGICAL) IMPLANT
GAUZE SPONGE 4X4 12PLY STRL (GAUZE/BANDAGES/DRESSINGS) ×3 IMPLANT
GLOVE BIOGEL PI IND STRL 9 (GLOVE) ×1 IMPLANT
GLOVE BIOGEL PI INDICATOR 9 (GLOVE) ×2
GLOVE SURG ORTHO 9.0 STRL STRW (GLOVE) ×3 IMPLANT
GOWN STRL REUS W/ TWL XL LVL3 (GOWN DISPOSABLE) ×2 IMPLANT
GOWN STRL REUS W/TWL XL LVL3 (GOWN DISPOSABLE) ×4
HANDPIECE INTERPULSE COAX TIP (DISPOSABLE)
KIT BASIN OR (CUSTOM PROCEDURE TRAY) ×3 IMPLANT
KIT DRSG PREVENA PLUS 7DAY 125 (MISCELLANEOUS) ×2 IMPLANT
KIT TURNOVER KIT B (KITS) ×3 IMPLANT
MANIFOLD NEPTUNE II (INSTRUMENTS) ×3 IMPLANT
NS IRRIG 1000ML POUR BTL (IV SOLUTION) ×3 IMPLANT
PACK ORTHO EXTREMITY (CUSTOM PROCEDURE TRAY) ×3 IMPLANT
PAD ARMBOARD 7.5X6 YLW CONV (MISCELLANEOUS) ×6 IMPLANT
SET HNDPC FAN SPRY TIP SCT (DISPOSABLE) IMPLANT
STOCKINETTE IMPERVIOUS 9X36 MD (GAUZE/BANDAGES/DRESSINGS) IMPLANT
SWAB COLLECTION DEVICE MRSA (MISCELLANEOUS) ×3 IMPLANT
SWAB CULTURE ESWAB REG 1ML (MISCELLANEOUS) IMPLANT
TOWEL GREEN STERILE (TOWEL DISPOSABLE) ×3 IMPLANT
TUBE CONNECTING 12'X1/4 (SUCTIONS) ×1
TUBE CONNECTING 12X1/4 (SUCTIONS) ×2 IMPLANT
YANKAUER SUCT BULB TIP NO VENT (SUCTIONS) ×3 IMPLANT

## 2019-09-19 NOTE — Transfer of Care (Signed)
Immediate Anesthesia Transfer of Care Note  Patient: Robert Sullivan  Procedure(s) Performed: DEBRIDEMENT LEFT HIP Appilcattion OF WOUND VAC (Left )  Patient Location: PACU  Anesthesia Type:General  Level of Consciousness: awake, alert  and oriented  Airway & Oxygen Therapy: Patient Spontanous Breathing  Post-op Assessment: Report given to RN and Post -op Vital signs reviewed and stable  Post vital signs: Reviewed and stable  Last Vitals:  Vitals Value Taken Time  BP 84/54 09/19/19 0925  Temp 36.6 C 09/19/19 0924  Pulse 86 09/19/19 0927  Resp 14 09/19/19 0927  SpO2 94 % 09/19/19 0927  Vitals shown include unvalidated device data.  Last Pain:  Vitals:   09/19/19 0500  TempSrc: Oral  PainSc:          Complications: No apparent anesthesia complications

## 2019-09-19 NOTE — Progress Notes (Signed)
Getting vitals then will contact on call MD.

## 2019-09-19 NOTE — Interval H&P Note (Signed)
History and Physical Interval Note:  09/19/2019 6:57 AM  Robert Sullivan  has presented today for surgery, with the diagnosis of Abscess Left Hip.  The various methods of treatment have been discussed with the patient and family. After consideration of risks, benefits and other options for treatment, the patient has consented to  Procedure(s): DEBRIDEMENT LEFT HIP (Left) as a surgical intervention.  The patient's history has been reviewed, patient examined, no change in status, stable for surgery.  I have reviewed the patient's chart and labs.  Questions were answered to the patient's satisfaction.     Newt Minion

## 2019-09-19 NOTE — Anesthesia Procedure Notes (Signed)
Procedure Name: Intubation Date/Time: 09/19/2019 8:39 AM Performed by: Griffin Dakin, CRNA Pre-anesthesia Checklist: Patient identified, Emergency Drugs available, Suction available and Patient being monitored Patient Re-evaluated:Patient Re-evaluated prior to induction Oxygen Delivery Method: Circle system utilized Preoxygenation: Pre-oxygenation with 100% oxygen Induction Type: IV induction Ventilation: Mask ventilation without difficulty and Oral airway inserted - appropriate to patient size Laryngoscope Size: Glidescope and 4 Grade View: Grade I Tube type: Oral Tube size: 7.5 mm Number of attempts: 1 Airway Equipment and Method: Stylet and Oral airway Placement Confirmation: ETT inserted through vocal cords under direct vision,  positive ETCO2 and breath sounds checked- equal and bilateral Secured at: 22 cm Tube secured with: Tape Dental Injury: Teeth and Oropharynx as per pre-operative assessment

## 2019-09-19 NOTE — Op Note (Signed)
09/19/2019  9:18 AM  PATIENT:  Robert Sullivan    PRE-OPERATIVE DIAGNOSIS:  Abscess Left Hip  POST-OPERATIVE DIAGNOSIS:  Same  PROCEDURE:  DEBRIDEMENT LEFT HIP Appilcattion OF WOUND VAC Local tissue rearrangement for wound closure 15 x 5 cm and 5   SURGEON:  Newt Minion, MD  PHYSICIAN ASSISTANT:None ANESTHESIA:   General  PREOPERATIVE INDICATIONS:  Robert Sullivan is a  54 y.o. male with a diagnosis of Abscess Left Hip who failed conservative measures and elected for surgical management.    The risks benefits and alternatives were discussed with the patient preoperatively including but not limited to the risks of infection, bleeding, nerve injury, cardiopulmonary complications, the need for revision surgery, among others, and the patient was willing to proceed.  OPERATIVE IMPLANTS: Praveena customizable wound VAC.  @ENCIMAGES @  OPERATIVE FINDINGS: Large serous fluid collection fluid and tissue sent for cultures. No necrotic tissue.  OPERATIVE PROCEDURE: Patient was brought the operating room and underwent a general anesthetic. After adequate levels anesthesia were obtained patient was placed in a floppy lateral position of the left lower extremity was prepped using DuraPrep draped into a sterile field a timeout was called. Elliptical incision was made around the previous draining sinus tracts this left a wound that was 15 x 5 cm in width 15 cm in length. There was a large serous fluid collection no purulence no blood. The soft tissue margins were resected back to healthy bleeding petechial healthy tissue. This was irrigated with normal saline electrocautery was used for hemostasis local tissue rearrangement was used to close the wound 15 x 5 cm a customizable wound VAC was applied this had a good suction fit patient was extubated taken to the PACU in stable condition.   DISCHARGE PLANNING:  Antibiotic duration: Antibiotics per cultures.  Weightbearing: Weightbearing as tolerated on the  left  Pain medication: Opioid pathway  Dressing care/ Wound VAC: Continue wound VAC with Praveena at discharge  Ambulatory devices: Walker  Discharge to: Anticipate discharge to home.  Follow-up: In the office 1 week post operative.

## 2019-09-19 NOTE — Evaluation (Signed)
Physical Therapy Evaluation Patient Details Name: Robert Sullivan MRN: [id number] DOB: Nov 22, 1965 Today's Date: 09/19/2019   History of Present Illness  54 year old male with recent history of fall and subsequent gluteal abscess. Pt with recent hospitalization for this, has undergone IR drainage and x6 surgical I&Ds Dec 2020-Jan 2021 with excision of necrotic gluteal. Pt readmitted 3/4 with shortness of breath, fluid collection L gluteal soft tissue (pending culture), cirrhosis with ascites s/p paracentesis and L thigh aspiration 3/6. Left hip debridement and wound VAC placement 3/12. PMHx:DM2, OA, septic shock, obesity, and arthritis.    Clinical Impression  Patient presented sitting in bed, awake, agitated about current health, but able to participate in therapy. Pt's wife, Robert Sullivan, was present throughout session, sharing agitation about pt's health. At the time of evaluation, pt presents with generalized LE weakness and decreased activity tolerance. Pt was able to complete supine to sit EOB at mod I level. At EOB, pt's HR increased from 98 to 134. Pt was able to perform sit to stand with supervision for safety. His HR inc to 142, he became dizzy and required a seated rest break where his HR returned to baseline. SpO2 remained >94% on RA. His BP post-exercise was 103/62. Due to dizziness and increased HR, ambulation was not attempted today. He was able to complete general LE exercises consisting of quad sets, glute sets, heel slides, straight leg raises, and hip abd/add supine in bed. Pt is very motivated to improve health, ambulate, and improve strength to return to PLOF. Recommend OP PT after d/c to address deficits. Pt would continue to benefit from skilled physical therapy services at this time while admitted and after d/c to address the below listed limitations in order to improve overall safety and independence with functional mobility.     Follow Up Recommendations Outpatient PT    Equipment  Recommendations  None recommended by PT    Recommendations for Other Services       Precautions / Restrictions Precautions Precautions: Fall Precaution Comments: Wound VAC L LE Restrictions Weight Bearing Restrictions: No      Mobility  Bed Mobility Overal bed mobility: Modified Independent             General bed mobility comments: HOB elevated slightly, use of bedrails, no physical assist, HR 134 from 98 (rest) upon sitting EOB  Transfers Overall transfer level: Needs assistance Equipment used: None(elevated bed) Transfers: Sit to/from Stand Sit to Stand: Supervision         General transfer comment: supervision for safety, able to use B UE to push up from bed. HR up to 142 bpm, dizzy, required rest break  Ambulation/Gait                Stairs            Wheelchair Mobility    Modified Rankin (Stroke Patients Only)       Balance Overall balance assessment: Mild deficits observed, not formally tested;History of Falls Sitting-balance support: No upper extremity supported;Feet supported Sitting balance-Leahy Scale: Good     Standing balance support: No upper extremity supported Standing balance-Leahy Scale: Good Standing balance comment: Pt felt dizzy and required seated rest break                             Pertinent Vitals/Pain Pain Assessment: Faces Faces Pain Scale: Hurts little more Pain Location: L LE: lateral thigh, surgical site Pain Descriptors / Indicators: Burning;Aching;Discomfort Pain Intervention(s): Premedicated  before session;Monitored during session    [place] expects to be discharged to:: Private residence Living Arrangements: Children;Spouse/significant other Available Help at Discharge: Family;Available 24 hours/day Type of Home: House Home Access: Stairs to enter Entrance Stairs-Rails: None Entrance Stairs-Number of Steps: 4 Home Layout: Two level;Bed/bath upstairs Home Equipment:  Walker - 2 wheels;Crutches      Prior Function Level of Independence: Independent with assistive device(s)         Comments: (per previous eval) pt reports using crutches to negotiate stairs at home, states he ambulates without AD. Pt does require assist for L thigh dressing changes due to area being difficult to reach.     Hand Dominance   Dominant Hand: Right    Extremity/Trunk Assessment   Upper Extremity Assessment Upper Extremity Assessment: Overall WFL for tasks assessed    Lower Extremity Assessment Lower Extremity Assessment: Generalized weakness       Communication   Communication: No difficulties  Cognition Arousal/Alertness: Awake/alert Behavior During Therapy: Agitated(initially agitated, less throughout) Overall Cognitive Status: Within Functional Limits for tasks assessed                                        General Comments General comments (skin integrity, edema, etc.): Robert Sullivan reports lifestyle changes to improve overall health. No pitting edema noted, sensation WNL.     Exercises General Exercises - Lower Extremity Ankle Circles/Pumps: AROM;Both;10 reps;Supine Quad Sets: AROM;Both;10 reps;Supine Gluteal Sets: AROM;Both;10 reps;Supine Heel Slides: AROM;Both;10 reps;Supine Hip ABduction/ADduction: AROM;10 reps;Right;5 reps;Left;Supine Straight Leg Raises: AROM;Right;5 reps;Left;10 reps;Supine   Assessment/Plan    PT Assessment Patient needs continued PT services  PT Problem List Decreased strength;Decreased activity tolerance;Decreased mobility;Obesity;Pain       PT Treatment Interventions DME instruction;Gait training;Stair training;Functional mobility training;Therapeutic activities;Therapeutic exercise;Balance training;Patient/family education    PT Goals (Current goals can be found in the Care Plan section)  Acute Rehab PT Goals Patient Stated Goal: Get stronger, get back to 'before' this past surgery (3/12)- be able to  walk around floor  PT Goal Formulation: With patient/family Time For Goal Achievement: 10/03/19 Potential to Achieve Goals: Good    Frequency Min 3X/week   Barriers to discharge        Co-evaluation               AM-PAC PT "6 Clicks" Mobility  Outcome Measure Help needed turning from your back to your side while in a flat bed without using bedrails?: None Help needed moving from lying on your back to sitting on the side of a flat bed without using bedrails?: A Little Help needed moving to and from a bed to a chair (including a wheelchair)?: A Little Help needed standing up from a chair using your arms (e.g., wheelchair or bedside chair)?: None Help needed to walk in hospital room?: A Little Help needed climbing 3-5 steps with a railing? : A Little 6 Click Score: 20    End of Session   Activity Tolerance: Patient limited by lethargy;Patient tolerated treatment well Patient left: in bed;with call bell/phone within reach;with family/visitor present   PT Visit Diagnosis: Other abnormalities of gait and mobility (R26.89);Muscle weakness (generalized) (M62.81)    Time: 1450-1600 PT Time Calculation (min) (ACUTE ONLY): 70 min   Charges:   PT Evaluation $PT Eval Moderate Complexity: 1 Mod PT Treatments $Therapeutic Exercise: 8-22 mins $Therapeutic Activity: 23-37 mins $Self Care/Home Management: 8-22  Dara Hoyer, SPT Acute Rehab  [phone]   Dara Hoyer 09/19/2019, 5:01 PM

## 2019-09-19 NOTE — Progress Notes (Signed)
Report given to short stay.

## 2019-09-19 NOTE — Significant Event (Signed)
Rapid Response Event Note  Overview: Called d/t HR-120s, BP-83/64, and pt c/o SOB/chest tightness. Time Called: 2029 Arrival Time: 2035 Event Type: Respiratory, Cardiac, Hypotension  Initial Focused Assessment: Pt laying in bed in no distress, alert and oriented. Pt says he is mildly SOB and is having some tightness in his chest which seems to be getting better. Pt has wound vac to L hip with bloody drainage. Lungs clear/diminished in bases. Skin pale, warm and dry. Pt says he is very frustrated because his wound vac has been beeping/malfunctioning all day and it has taken staff a long time to fix it. T-97.6, HR-120, BP-83/64, RR-20, SpO2-97% on RA.  Interventions: EKG-ST CBC/CMP Plan of Care (if not transferred): Continue to monitor BP. Repeat BP-110/71/chest tightness is better.  Follow-up with lab results. I question if  medication prn for anxiety/sleep would be helpful if he feels like needs it.  Event Summary:   at  2119-[place], NP notified        Outcome: Stayed and stabilized.    at          [place], Carren Rang

## 2019-09-19 NOTE — Progress Notes (Signed)
At shift change doing bedside reporting, patient voiced concerns regarding his heart rate going up today.  Day shift nurse notified MD.  Now upon doing assessment patient is stating that he is having shortness of breath and tightness in his chest.  It just started he said about ten minutes ago.  Will contact on call MD.

## 2019-09-19 NOTE — Anesthesia Preprocedure Evaluation (Addendum)
Anesthesia Evaluation  Patient identified by MRN, date of birth, ID band Patient awake    Reviewed: Allergy & Precautions, NPO status , Patient's Chart, lab work & pertinent test results  Airway Mallampati: I  TM Distance: >3 FB Neck ROM: Full    Dental no notable dental hx. (+) Teeth Intact, Dental Advisory Given   Pulmonary sleep apnea , former smoker,    Pulmonary exam normal breath sounds clear to auscultation       Cardiovascular hypertension, Pt. on medications + DVT  Normal cardiovascular exam Rhythm:Regular Rate:Normal  TTE 09/2019 1. Normal LV function; trace MR; ascites noted on subcostal views.  2. Left ventricular ejection fraction, by estimation, is 60 to 65%. The left ventricle has normal function. The left ventricle has no regional wall motion abnormalities. Left ventricular diastolic parameters were normal.  3. Right ventricular systolic function is normal. The right ventricular size is normal.  4. The mitral valve is normal in structure and function. No evidence of mitral valve regurgitation. No evidence of mitral stenosis.  5. The aortic valve is tricuspid. Aortic valve regurgitation is not visualized. No aortic stenosis is present.  6. The inferior vena cava is normal in size with greater than 50% respiratory variability, suggesting right atrial pressure of 3 mmHg.   Neuro/Psych  Headaches, PSYCHIATRIC DISORDERS Anxiety Depression    GI/Hepatic negative GI ROS, (+) Cirrhosis       ,   Endo/Other  diabetesMorbid obesity (BMI 42)  Renal/GU negative Renal ROS  negative genitourinary   Musculoskeletal  (+) Arthritis ,   Abdominal   Peds  Hematology  (+) Blood dyscrasia (Hgb 9.3, plt 90), anemia ,   Anesthesia Other Findings recent history of MSSA bacteremia-recurrent left gluteal abscess-s/p numerous readmissions/debridements  Reproductive/Obstetrics                            Anesthesia Physical Anesthesia Plan  ASA: III  Anesthesia Plan: General   Post-op Pain Management:    Induction: Intravenous  PONV Risk Score and Plan: 2 and Midazolam, Dexamethasone and Ondansetron  Airway Management Planned: Oral ETT  Additional Equipment:   Intra-op Plan:   Post-operative Plan: Extubation in OR  Informed Consent: I have reviewed the patients History and Physical, chart, labs and discussed the procedure including the risks, benefits and alternatives for the proposed anesthesia with the patient or authorized representative who has indicated his/her understanding and acceptance.     Dental advisory given  Plan Discussed with: CRNA  Anesthesia Plan Comments:         Anesthesia Quick Evaluation

## 2019-09-19 NOTE — Progress Notes (Signed)
Contacted [facility].  EKG done - patient is tachycardic - no afib.  Will contact MD for orders for anxiety.  Current BP 110/71 HR 115.

## 2019-09-19 NOTE — Progress Notes (Signed)
PT Progress Note for Charges    09/19/19 1659  PT Visit Information  Last PT Received On 09/19/19  PT General Charges  $$ ACUTE PT VISIT 1 Visit  PT Evaluation  $PT Eval Moderate Complexity 1 Mod  PT Treatments  $Therapeutic Exercise 8-22 mins  $Therapeutic Activity 23-37 mins  $Self Care/Home Management 8-22  Anastasio Champion, DPT  Acute Rehabilitation Services Pager [phone] Office [phone]

## 2019-09-19 NOTE — Plan of Care (Addendum)
Nutrition Education Note  RD consulted for nutrition education regarding cirrhosis.  Received additional consult for education. Per discussion with RN, pt and wife requesting RD consult due to multiple diet related questions.  Spoke with pt and wife Sharyn Lull) at bedside. Pt reports that he has modified his diet greatly over the past 8 days; he understands that he has a poor prognosis secondary to cirrhosis, but expresses that he wants to remain positive and do whatever he can within his control to extend the quality and quantity of his life, including following a plant based diet. Pt voiced he does not want to consume any type of animal protein and prefers a whole foods, organic type diet.   Pt shares that while in the hospital he has been consuming a lot of fresh fruits and vegetables and feels encouraged over good control of his CBGS, even with his hip abscess s/p surgery today. RD spent approximately 30 minutes with pt in his wife reviewing current diet as well recommendations to follow a low sodium, consistent carbohydrate, plant based diet at home. Pt focused conversation on ways to provide whole grains to assist with glycemic control, reducing sodium in diet, and options for plant based protein, such as tofu, nuts/nut butters, dried beans, hummus, and quinoa.   RD provided "Cirrhosis Nutrition Therapy" handout from the [employer]. Reviewed patient's dietary recall. Provided examples on ways to decrease sodium intake in diet. Discouraged intake of processed foods and use of salt shaker. Encouraged fresh fruits and vegetables as well as whole grain sources of carbohydrates to maximize fiber intake.   Educated pt on fiber intake to assist with glycemic control. Also discussed options for plant based proteins.Teach back method used.  Expect fair to good compliance.  RD will monitor at follow-up for PO adequacy, supplement acceptance, and further nutrition-related questions  and needs.   [name], RD, LDN, [place] Registered Dietitian II Certified Diabetes Care and Education Specialist Please refer to [facility] for RD and/or RD on-call/weekend/after hours pager

## 2019-09-19 NOTE — Plan of Care (Signed)

## 2019-09-19 NOTE — Progress Notes (Signed)
PROGRESS NOTE        PATIENT DETAILS Name: Robert Sullivan Age: 54 y.o. Sex: male Date of Birth: [birth year] Admit Date: 09/11/2019 Admitting Physician Orson Eva, MD BUL:AGTXMIWO, Tenna Child, PA-C  Brief Narrative: Patient is a 54 y.o. male with history of Robert Sullivan cirrhosis-recent history of MSSA bacteremia-recurrent left gluteal abscess-s/p numerous readmissions/debridements-referred from GI office for evaluation of anasarca in the setting of decompensated liver cirrhosis.  Found to have rim enhancing lesion on CT abdomen on admission within the left gluteal soft tissue area-concerning for an abscess-however aspiration cultures are negative so far raising question of whether this was actually a seroma.  Significant events this admit: 3/5>> admit to [facility] from GI office for evaluation of anasarca 3/6>> ultrasound guided large-volume paracentesis (9 L) by IR 3/6>> ultrasound-guided aspiration of left gluteal abscess by IR 3/5>>TTE-EF 60-65% 3/5>>Lower Ext Doppler-no DVT  Antibiotics this admit: Vancomycin:3/11>> Vancomycin:3/4>>3/6 Unasyn:3/6>> 3/8  Prior relevant admissions: 05/12/19>>05/17/19: Admit for sepsis secondary to UTI, left hip hematoma due to fall. 06/10/2019>> 06/30/2019: Admit for MSSA bacteremia, left gluteal abscess 07/09/2019>> 07/25/2019: Admit by ortho for recurrent left hip abscess-s/p debridement x3  Prior relevant Procedures: 07/18/19>> repeat irrigation and debridement of left hip 07/16/19>> left hip debridement 07/12/19>> left hip debridement 06/16/19>> TEE-no vegetation 06/17/19>> ultrasound-guided left gluteal abscess drain placement by IR 07/09/19>> I&D of left hip abscess 06/25/19>> I&D of left gluteal abscess 06/24/19>> I&D of left gluteal abscess  Microbiology: 09/13/19>>left gluteus aspiration culture: gm +ve cocci 09/12/19>>blood culture: Negative 07/16/19>> tissue left hip: Enterococcus faecalis, Finegoldia magna 07/09/19>>Tissue left hip:  Enterobacter cloacae 06/17/19>> left gluteal abscess: MSSA 06/10/19>> blood culture: MSSA 06/10/19>> urine culture: MSSA 05/12/19>> urine culture: E. Coli  Consults: IR ID Ortho  Subjective: Very frustrated that he has a wound VAC-concerned about the amount of IVF with IV ampicillin.  Assessment/Plan: Anasarca/ascites/bilateral pleural effusion: Secondary to decompensated liver cirrhosis  UA without evidence of proteinuria, echo with preserved EF.  Underwent large-volume paracentesis (9 L) on 3/6.  Volume status continues to improve-I/O's not charted accurately I suspect-but weight decreased to 332 pounds (370 pounds on admission).  Although volume status improved-he still has some mild volume overload-but suspect we could now transition to oral Lasix and Aldactone.  Lower extremity Dopplers negative for DVT.  Portal hypertension seen on CT imaging: Will need EGD at some point to confirm portal hypertensive gastropathy/survey for varices.  Recurrent left gluteal abscess: S/p ultrasound-guided aspiration on 3/6-cultures had been negative for the past several days-but has now positive for Enterococcus.  After discussion with ID-now on IV ampicillin.  Dr. Sharol Given performed a repeat irrigation and debridement on 3/12.  Will await further recommendations from ID-but looks like patient would get around 5-600 cc of IV fluid with ampicillin-may need to adjust diuretic dosing.  Will reassess volume status tomorrow.    Hyperkalemia: Resolved  Hypokalemia: Stable.  Follow.  DM-2 (A1c 4.6 on 3/5): CBG stable-no longer on Lantus-on SSI.  Recent Labs    09/19/19 0646 09/19/19 0923 09/19/19 1125  GLUCAP 103* 103* 108*   Atypical chest pain: Resolved-troponins negative.  Thrombocytopenia: Mild-secondary to hypersplenism in the setting of liver cirrhosis.  Anemia: Probably secondary to chronic liver disease.  Follow for now.  Obesity: Estimated body mass index is 41.55 kg/m as calculated from the  following:   Height as of this encounter: 6' 2.5" (1.892 m).  Weight as of this encounter: 148.8 kg.    Diet: Diet Order            Diet Carb Modified Fluid consistency: Thin; Room service appropriate? Yes  Diet effective now               DVT Prophylaxis: Prophylactic Lovenox   Code Status: Full code   Family Communication: Spouse at bedside on 3/12  Disposition Plan: Home  Barriers to Discharge: Left gluteal abscess needing debridement on 3/12-on IV antibiotics  Antimicrobial agents: Anti-infectives (From admission, onward)   Start     Dose/Rate Route Frequency Ordered Stop   09/19/19 0600  ceFAZolin (ANCEF) 3 g in dextrose 5 % 50 mL IVPB     3 g 100 mL/hr over 30 Minutes Intravenous On call to O.R. 09/18/19 1717 09/19/19 0900   09/19/19 0000  vancomycin (VANCOREADY) IVPB 1750 mg/350 mL  Status:  Discontinued     1,750 mg 175 mL/hr over 120 Minutes Intravenous Every 12 hours 09/18/19 1019 09/18/19 1759   09/18/19 2000  ampicillin (OMNIPEN) 2 g in sodium chloride 0.9 % 100 mL IVPB     2 g 300 mL/hr over 20 Minutes Intravenous Every 4 hours 09/18/19 1801     09/18/19 1100  vancomycin (VANCOREADY) IVPB 2000 mg/400 mL  Status:  Discontinued     2,000 mg 200 mL/hr over 120 Minutes Intravenous STAT 09/18/19 1009 09/18/19 1013   09/18/19 1100  vancomycin (VANCOCIN) 2,500 mg in sodium chloride 0.9 % 500 mL IVPB     2,500 mg 250 mL/hr over 120 Minutes Intravenous STAT 09/18/19 1013 09/18/19 1230   09/13/19 1800  Ampicillin-Sulbactam (UNASYN) 3 g in sodium chloride 0.9 % 100 mL IVPB  Status:  Discontinued     3 g 200 mL/hr over 30 Minutes Intravenous Every 6 hours 09/13/19 1621 09/15/19 1101   09/12/19 2000  vancomycin (VANCOREADY) IVPB 1500 mg/300 mL  Status:  Discontinued     1,500 mg 150 mL/hr over 120 Minutes Intravenous Every 8 hours 09/12/19 1037 09/13/19 1629   09/12/19 1045  vancomycin (VANCOREADY) IVPB 2000 mg/400 mL     2,000 mg 200 mL/hr over 120 Minutes  Intravenous  Once 09/12/19 1037 09/12/19 1354      Time spent: 25 minutes-Greater than 50% of this time was spent in counseling, explanation of diagnosis, planning of further management, and coordination of care.  MEDICATIONS: Scheduled Meds: . docusate sodium  100 mg Oral BID  . enoxaparin (LOVENOX) injection  40 mg Subcutaneous Q24H  . feeding supplement (PRO-STAT SUGAR FREE 64)  30 mL Oral QID  . furosemide  40 mg Oral Daily  . insulin aspart  0-9 Units Subcutaneous TID WC  . multivitamin with minerals  1 tablet Oral Daily  . potassium chloride  40 mEq Oral Daily  . sodium chloride flush  3 mL Intravenous Once  . spironolactone  200 mg Oral Daily   Continuous Infusions: . sodium chloride 10 mL/hr at 09/19/19 1434  . ampicillin (OMNIPEN) IV 2 g (09/19/19 1201)  . lactated ringers 10 mL/hr at 09/19/19 0827   PRN Meds:.acetaminophen **OR** acetaminophen, HYDROmorphone (DILAUDID) injection, metoCLOPramide **OR** metoCLOPramide (REGLAN) injection, ondansetron **OR** ondansetron (ZOFRAN) IV, oxyCODONE   PHYSICAL EXAM: Vital signs: Vitals:   09/19/19 1007 09/19/19 1022 09/19/19 1037 09/19/19 1118  BP: 116/73 132/74 128/77 113/65  Pulse: 82 81 80 82  Resp: 15 15 14 18   Temp:    97.7 F (36.5 C)  TempSrc:  Oral  SpO2: 96% 98% 97% 97%  Weight:      Height:       Filed Weights   09/18/19 0500 09/19/19 0655 09/19/19 0740  Weight: (!) 150.6 kg (!) 148.8 kg (!) 148.8 kg   Body mass index is 41.55 kg/m.   Gen Exam:Alert awake-not in any distress HEENT:atraumatic, normocephalic Chest: B/L clear to auscultation anteriorly CVS:S1S2 regular Abdomen:soft non tender, non distended Extremities:+ edema Neurology: Non focal Skin: no rash  I have personally reviewed following labs and imaging studies  LABORATORY DATA: CBC: Recent Labs  Lab 09/13/19 0513 09/14/19 0353 09/15/19 0240 09/16/19 0346 09/19/19 0412  WBC 4.8 2.8* 3.3* 3.4* 4.1  HGB 9.3* 8.2* 8.5* 9.2* 9.3*    HCT 30.8* 26.5* 27.1* 28.6* 30.3*  MCV 82.8 82.3 79.7* 79.7* 82.1  PLT 121* 96* 95* 96* 90*    Basic Metabolic Panel: Recent Labs  Lab 09/15/19 0240 09/16/19 0346 09/17/19 0410 09/18/19 0509 09/19/19 0412  NA 141 140 141 139 139  K 3.2* 4.3 3.4* 3.5 3.6  CL 106 102 102 102 101  CO2 27 27 29 30 29   GLUCOSE 103* 94 115* 116* 123*  BUN 5* <5* 5* 7 10  CREATININE 0.62 0.78 0.73 0.90 0.75  CALCIUM 7.9* 8.2* 8.3* 8.1* 8.2*  MG  --  1.7  --   --  2.0    GFR: Estimated Creatinine Clearance: 167.4 mL/min (by C-G formula based on SCr of 0.75 mg/dL).  Liver Function Tests: Recent Labs  Lab 09/15/19 0240 09/16/19 0346 09/17/19 0410 09/18/19 0509 09/19/19 0412  AST 42* 69* 43* 42* 40  ALT 14 14 14 15 12   ALKPHOS 78 81 87 89 84  BILITOT 1.0 1.2 1.0 0.6 0.8  PROT 5.1* 5.2* 5.4* 5.5* 5.3*  ALBUMIN 2.1* 2.2* 2.1* 2.1* 2.1*   No results for input(s): LIPASE, AMYLASE in the last 168 hours. No results for input(s): AMMONIA in the last 168 hours.  Coagulation Profile: Recent Labs  Lab 09/14/19 0353  INR 1.6*    Cardiac Enzymes: No results for input(s): CKTOTAL, CKMB, CKMBINDEX, TROPONINI in the last 168 hours.  BNP (last 3 results) No results for input(s): PROBNP in the last 8760 hours.  Lipid Profile: No results for input(s): CHOL, HDL, LDLCALC, TRIG, CHOLHDL, LDLDIRECT in the last 72 hours.  Thyroid Function Tests: No results for input(s): TSH, T4TOTAL, FREET4, T3FREE, THYROIDAB in the last 72 hours.  Anemia Panel: No results for input(s): VITAMINB12, FOLATE, FERRITIN, TIBC, IRON, RETICCTPCT in the last 72 hours.  Urine analysis:    Component Value Date/Time   COLORURINE AMBER (A) 09/12/2019 1057   APPEARANCEUR HAZY (A) 09/12/2019 1057   LABSPEC 1.030 09/12/2019 1057   PHURINE 5.0 09/12/2019 1057   GLUCOSEU NEGATIVE 09/12/2019 1057   HGBUR NEGATIVE 09/12/2019 1057   BILIRUBINUR NEGATIVE 09/12/2019 1057   KETONESUR NEGATIVE 09/12/2019 1057   PROTEINUR 30  (A) 09/12/2019 1057   NITRITE NEGATIVE 09/12/2019 1057   LEUKOCYTESUR NEGATIVE 09/12/2019 1057    Sepsis Labs: Lactic Acid, Venous    Component Value Date/Time   LATICACIDVEN 2.6 (HH) 06/10/2019 1345    MICROBIOLOGY: Recent Results (from the past 240 hour(s))  Culture, blood (single)     Status: None   Collection Time: 09/12/19 10:50 AM   Specimen: BLOOD RIGHT WRIST  Result Value Ref Range Status   Specimen Description BLOOD RIGHT WRIST  Final   Special Requests   Final    BOTTLES DRAWN AEROBIC AND ANAEROBIC Blood  Culture adequate volume   Culture   Final    NO GROWTH 5 DAYS Performed at [facility], [place] [address], [place], [place] [address]    Report Status 09/17/2019 FINAL  Final  SARS CORONAVIRUS 2 (TAT 6-24 HRS) Nasopharyngeal Nasopharyngeal Swab     Status: None   Collection Time: 09/12/19 11:22 AM   Specimen: Nasopharyngeal Swab  Result Value Ref Range Status   SARS Coronavirus 2 NEGATIVE NEGATIVE Final    Comment: (NOTE) SARS-CoV-2 target nucleic acids are NOT DETECTED. The SARS-CoV-2 RNA is generally detectable in upper and lower respiratory specimens during the acute phase of infection. Negative results do not preclude SARS-CoV-2 infection, do not rule out co-infections with other pathogens, and should not be used as the sole basis for treatment or other patient management decisions. Negative results must be combined with clinical observations, patient history, and epidemiological information. The expected result is Negative. Fact Sheet for Patients: SugarRoll.be Fact Sheet for Healthcare Providers: https://www.woods-mathews.com/ This test is not yet approved or cleared by the [place] FDA and  has been authorized for detection and/or diagnosis of SARS-CoV-2 by FDA under an Emergency Use Authorization (EUA). This EUA will remain  in effect (meaning this test can be used) for the duration of the COVID-19  declaration under Section 56 4(b)(1) of the Act, 21 U.S.C. section 360bbb-3(b)(1), unless the authorization is terminated or revoked sooner. Performed at [facility], [place] [address] [place], [place] [address]   Acid Fast Smear (AFB)     Status: None   Collection Time: 09/13/19 12:31 PM   Specimen: Soft Tissue Abscess  Result Value Ref Range Status   AFB Specimen Processing Concentration  Final   Acid Fast Smear Negative  Final    Comment: (NOTE) Performed At: [facility] [place], [place] [id number] Rush Farmer MD [id number]    Source (AFB) ABSCESS  Final    Comment: GLUTEAL Performed at [facility], [place] [address] [place], [place] [address]   Culture, body fluid-bottle     Status: None   Collection Time: 09/13/19 12:31 PM   Specimen: Abscess  Result Value Ref Range Status   Specimen Description ABSCESS  Final   Special Requests GLUTEAL  Final   Gram Stain   Final    ANAEROBIC BOTTLE ONLY GRAM POSITIVE COCCI CRITICAL RESULT CALLED TO, READ BACK BY AND VERIFIED WITH: A NEVIUS RN 09/18/19 0101 JDW Performed at [facility], [place] [address] [place], [place] [address]    Culture MODERATE ENTEROCOCCUS FAECALIS  Final   Report Status 09/19/2019 FINAL  Final   Organism ID, Bacteria ENTEROCOCCUS FAECALIS  Final      Susceptibility   Enterococcus faecalis - MIC*    AMPICILLIN <=2 SENSITIVE Sensitive     VANCOMYCIN 1 SENSITIVE Sensitive     GENTAMICIN SYNERGY SENSITIVE Sensitive     * MODERATE ENTEROCOCCUS FAECALIS  Gram stain     Status: None   Collection Time: 09/13/19 12:31 PM   Specimen: Abscess  Result Value Ref Range Status   Specimen Description ABSCESS  Final   Special Requests GLUTEAL  Final   Gram Stain   Final    CYTOSPIN SMEAR WBC PRESENT, PREDOMINANTLY PMN NO ORGANISMS SEEN Performed at [facility], [place] [address], [place], [place] [address]    Report Status 09/13/2019 FINAL  Final  Fungus culture, blood      Status: None (Preliminary result)  Collection Time: 09/13/19 12:31 PM   Specimen: Abscess  Result Value Ref Range Status   Specimen Description ABSCESS  Final   Special Requests GLUTEAL  Final   Culture   Final    NO GROWTH 6 DAYS Performed at [facility], [address], [place], [place] [address]    Report Status PENDING  Incomplete  Culture, body fluid-bottle     Status: None   Collection Time: 09/13/19 12:31 PM   Specimen: Ascitic  Result Value Ref Range Status   Specimen Description ASCITIC  Final   Special Requests NONE  Final   Culture   Final    NO GROWTH 5 DAYS Performed at [facility], [address], [place], [place] [address]    Report Status 09/18/2019 FINAL  Final  Gram stain     Status: None   Collection Time: 09/13/19 12:31 PM   Specimen: Ascitic  Result Value Ref Range Status   Specimen Description ASCITIC  Final   Special Requests NONE  Final   Gram Stain   Final    CYTOSPIN SMEAR WBC PRESENT, PREDOMINANTLY MONONUCLEAR NO ORGANISMS SEEN Performed at [facility], [place] [address], [place], [place] [address]    Report Status 09/13/2019 FINAL  Final  Surgical pcr screen     Status: None   Collection Time: 09/18/19  5:26 PM   Specimen: Nasal Mucosa; Nasal Swab  Result Value Ref Range Status   MRSA, PCR NEGATIVE NEGATIVE Final   Staphylococcus aureus NEGATIVE NEGATIVE Final    Comment: (NOTE) The Xpert SA Assay (FDA approved for NASAL specimens in patients 56 years of age and older), is one component of a comprehensive surveillance program. It is not intended to diagnose infection nor to guide or monitor treatment. Performed at [facility], [place] [address], [place], [place] [address]     RADIOLOGY STUDIES/RESULTS: [place] EKG SITE RITE  Result Date: 09/19/2019 If Site Rite image not attached, placement could not be confirmed due to current cardiac rhythm.    LOS: 7 days   Oren Binet, MD  [facility]    To contact the attending provider between 7A-7P or the covering provider during after hours 7P-7A, please log into the web site [email or web address] and access using universal [place] password for that web site. If you do not have the password, please call the hospital operator.  09/19/2019, 4:44 PM

## 2019-09-19 NOTE — Progress Notes (Signed)
ID PROGRESS NOTE  54yo M with hx of polymicrobial left gluteal abscess s/p multiple I X D, initially admitted for cirrhosis management, found to have reaccumulation of fluid in left gluteal region concern for seroma vs. Abscess. Culture did grow e.faecalis on day 5  Suspect light growth of infection but warrants treatment.  He underwent I x D with wound vac placement today on 3/12  A/p:e.faecalis gluteal abscess - plan to treat with 3 wks of ampicillin continuous infusion - currently day 1 of 21 - please place picc line - will need weekly cbc, bmp - recommend to consider increasing diuretics slightly since ivf may increase his fluid status by 640m per day - will sign off and arrange for visit to [facility] for follow up on 2 wk  Diagnosis: Gluteal abscess  Culture Result: e.facaelis  Allergies  Allergen Reactions  . Sulfa Antibiotics Rash    Total body rash    OPAT Orders Discharge antibiotics: Per pharmacy protocol ampicillin continously infused  Duration: 3 wk End Date: April 2nd  PWanettePer Protocol:  Home health RN for IV administration and teaching; PICC line care and labs.    Labs weekly while on IV antibiotics: _x_ CBC with differential _x_ BMP   _x_ Please pull PIC at completion of IV antibiotics   Fax weekly labs to [phone] Clinic [facility] [phone]

## 2019-09-19 NOTE — Anesthesia Postprocedure Evaluation (Signed)
Anesthesia Post Note  Patient: Luian Schumpert  Procedure(s) Performed: DEBRIDEMENT LEFT HIP Appilcattion OF WOUND VAC (Left )     Patient location during evaluation: PACU Anesthesia Type: General Level of consciousness: awake and alert Pain management: pain level controlled Vital Signs Assessment: post-procedure vital signs reviewed and stable Respiratory status: spontaneous breathing, nonlabored ventilation, respiratory function stable and patient connected to nasal cannula oxygen Cardiovascular status: blood pressure returned to baseline and stable Postop Assessment: no apparent nausea or vomiting Anesthetic complications: no    Last Vitals:  Vitals:   09/19/19 1037 09/19/19 1118  BP: 128/77 113/65  Pulse: 80 82  Resp: 14 18  Temp:  36.5 C  SpO2: 97% 97%    Last Pain:  Vitals:   09/19/19 1118  TempSrc: Oral  PainSc:                  Dawnetta Copenhaver L Mileydi Milsap

## 2019-09-20 LAB — FUNGUS CULTURE, BLOOD: Culture: NO GROWTH

## 2019-09-20 LAB — COMPREHENSIVE METABOLIC PANEL
ALT: 15 U/L (ref 0–44)
AST: 53 U/L — ABNORMAL HIGH (ref 15–41)
Albumin: 2.1 g/dL — ABNORMAL LOW (ref 3.5–5.0)
Alkaline Phosphatase: 86 U/L (ref 38–126)
Anion gap: 9 (ref 5–15)
BUN: 17 mg/dL (ref 6–20)
CO2: 25 mmol/L (ref 22–32)
Calcium: 8 mg/dL — ABNORMAL LOW (ref 8.9–10.3)
Chloride: 101 mmol/L (ref 98–111)
Creatinine, Ser: 0.97 mg/dL (ref 0.61–1.24)
GFR calc Af Amer: >60 mL/min (ref >60)
GFR calc non Af Amer: >60 mL/min (ref >60)
Glucose, Bld: 143 mg/dL — ABNORMAL HIGH (ref 70–99)
Potassium: 4 mmol/L (ref 3.5–5.1)
Sodium: 135 mmol/L (ref 135–145)
Total Bilirubin: 1.1 mg/dL (ref 0.3–1.2)
Total Protein: 5.4 g/dL — ABNORMAL LOW (ref 6.5–8.1)

## 2019-09-20 LAB — GLUCOSE, CAPILLARY
Glucose-Capillary: 136 mg/dL — ABNORMAL HIGH (ref 70–99)
Glucose-Capillary: 137 mg/dL — ABNORMAL HIGH (ref 70–99)
Glucose-Capillary: 134 mg/dL — ABNORMAL HIGH (ref 70–99)
Glucose-Capillary: 146 mg/dL — ABNORMAL HIGH (ref 70–99)

## 2019-09-20 LAB — CBC
HCT: 26 % — ABNORMAL LOW (ref 39.0–52.0)
Hemoglobin: 8.1 g/dL — ABNORMAL LOW (ref 13.0–17.0)
MCH: 25.1 pg — ABNORMAL LOW (ref 26.0–34.0)
MCHC: 31.2 g/dL (ref 30.0–36.0)
MCV: 80.5 fL (ref 80.0–100.0)
Platelets: 193 10*3/uL (ref 150–400)
RBC: 3.23 MIL/uL — ABNORMAL LOW (ref 4.22–5.81)
RDW: 16.5 % — ABNORMAL HIGH (ref 11.5–15.5)
WBC: 10.9 10*3/uL — ABNORMAL HIGH (ref 4.0–10.5)
nRBC: 0 % (ref 0.0–0.2)

## 2019-09-20 MED ORDER — FUROSEMIDE 40 MG PO TABS: 40.0000 mg | ORAL_TABLET | Freq: Every day | ORAL | Status: DC

## 2019-09-20 MED ORDER — POLYETHYLENE GLYCOL 3350 17 G PO PACK
17.0000 g | PACK | Freq: Every day | ORAL | Status: DC
  Administered 2019-09-20 – 2019-09-24 (×2): 17 g via ORAL
  Filled 2019-09-20 (×5): qty 1

## 2019-09-20 MED ORDER — SODIUM CHLORIDE 0.9% FLUSH: 10.0000 mL | INTRAVENOUS | Status: DC | PRN

## 2019-09-20 MED ORDER — CHLORHEXIDINE GLUCONATE CLOTH 2 % EX PADS
6.0000 | MEDICATED_PAD | Freq: Every day | CUTANEOUS | Status: DC
  Administered 2019-09-21 – 2019-09-25 (×5): 6 via TOPICAL

## 2019-09-20 NOTE — Plan of Care (Signed)

## 2019-09-20 NOTE — Progress Notes (Addendum)
Peripherally Inserted Central Catheter/Midline Placement  The IV Nurse has discussed with the patient and/or persons authorized to consent for the patient, the purpose of this procedure and the potential benefits and risks involved with this procedure.  The benefits include less needle sticks, lab draws from the catheter, and the patient may be discharged home with the catheter. Risks include, but not limited to, infection, bleeding, blood clot (thrombus formation), and puncture of an artery; nerve damage and irregular heartbeat and possibility to perform a PICC exchange if needed/ordered by physician.  Alternatives to this procedure were also discussed.  Bard Power PICC patient education guide, fact sheet on infection prevention and patient information card has been provided to patient /or left at bedside.  Wife at bedside agreeable to procedure, verbally gave consent as well as pt signed consent.  PICC/Midline Placement Documentation  PICC Single Lumen 25/42/70 PICC Right Basilic 44 cm 0 cm (Active)  Indication for Insertion or Continuance of Line Home intravenous therapies (PICC only) 09/20/19 1131  Exposed Catheter (cm) 0 cm 09/20/19 1131  Site Assessment Clean;Dry;Intact 09/20/19 1131  Line Status Saline locked;Flushed;Blood return noted 09/20/19 1131  Dressing Type Transparent 09/20/19 1131  Dressing Status Clean;Dry;Intact;Antimicrobial disc in place 09/20/19 [place] checked and tightened 09/20/19 1131  Line Adjustment (NICU/IV Team Only) No 09/20/19 1131  Dressing Intervention New dressing 09/20/19 [place] Due 09/27/19 09/20/19 1131       Rolena Infante 09/20/2019, 11:32 AM

## 2019-09-20 NOTE — Progress Notes (Signed)
PROGRESS NOTE        PATIENT DETAILS Name: Robert Sullivan Age: 54 y.o. Sex: male Date of Birth: 20-Aug-1965 Admit Date: 09/11/2019 Admitting Physician Orson Eva, MD BTD:[id number], Tenna Child, PA-C  Brief Narrative: Patient is a 54 y.o. male with history of Karlene Lineman cirrhosis-recent history of MSSA bacteremia-recurrent left gluteal abscess-s/p numerous readmissions/debridements-referred from GI office for evaluation of anasarca in the setting of decompensated liver cirrhosis.  Was also found to have recurrence of his left gluteal abscess on CT imaging.  Treated with diuretics with significant improvement in his volume status, subsequently evaluated by Dr. Linnell Fulling underwent irrigation and debridement on 3/12.  See below for further details.    Significant events this admit: 3/5>> admit to [facility] from GI office for evaluation of anasarca 3/5>>TTE-EF 60-65% 3/5>>Lower Ext Doppler-no DVT  Procedures this admission: 3/6>> ultrasound guided large-volume paracentesis (9 L) by IR 3/6>> ultrasound-guided aspiration of left gluteal abscess by IR 3/12>> irrigation/debridement of left gluteal area and application of wound VAC 3/13>> PICC line placement  Microbiology this admission 09/19/19>> intraoperative left hip tissue culture: Negative so far 09/13/19>>left gluteus aspiration culture: gm +ve cocci 09/12/19>>blood culture: Negative  Antimicrobial therapy this admit: Ampicillin 3/11>> Vancomycin:3/11>>3/11 Vancomycin:3/4>>3/6 Unasyn:3/6>> 3/8  Consults this admission: IR ID Ortho  Prior relevant admissions: 05/12/19>>05/17/19: Admit for sepsis secondary to UTI, left hip hematoma due to fall. 06/10/2019>> 06/30/2019: Admit for MSSA bacteremia, left gluteal abscess 07/09/2019>> 07/25/2019: Admit by ortho for recurrent left hip abscess-s/p debridement x3  Prior relevant Procedures: 07/18/19>> repeat irrigation and debridement of left hip 07/16/19>> left hip debridement 07/12/19>> left  hip debridement 06/16/19>> TEE-no vegetation 06/17/19>> ultrasound-guided left gluteal abscess drain placement by IR 07/09/19>> I&D of left hip abscess 06/25/19>> I&D of left gluteal abscess 06/24/19>> I&D of left gluteal abscess  Prior microbiology data: 07/16/19>> tissue left hip: Enterococcus faecalis, Finegoldia magna 07/09/19>>Tissue left hip: Enterobacter cloacae 06/17/19>> left gluteal abscess: MSSA 06/10/19>> blood culture: MSSA 06/10/19>> urine culture: MSSA 05/12/19>> urine culture: E. Coli  Subjective: Better spirits today-not as frustrated as yesterday.  Had an episode of hypotension yesterday evening.  Assessment/Plan: Anasarca/ascites/bilateral pleural effusion: Secondary to decompensated liver cirrhosis  UA without evidence of proteinuria, echo with preserved EF.  Underwent large-volume paracentesis (9 L) on 3/6.  Volume status continues to improve-I/O's not charted accurately -but weight decreased to 326 pounds (370 pounds on admission).  Lower extremity Dopplers negative.  Had a brief episode of hypotension yesterday which I suspect was from post anesthesia/narcotics along with Lasix Aldactone.  BP has improved and is currently stable-but will go ahead and hold diuretics for today and plan on resuming tomorrow.  Note-patient will be getting IVF with ampicillin infusion-and diuretic regimen may need to be escalated if he starts gaining weight/volume.   Portal hypertension seen on CT imaging: Will need EGD at some point to confirm portal hypertensive gastropathy/survey for varices.  Can be done in the outpatient setting.  Recurrent left gluteal abscess: S/p ultrasound-guided aspiration on 3/6+ for Enterococcus, evaluated by Dr. Sharol Given and underwent repeat irrigation and debridement on 3/12.  Intraoperative left gluteal/hip cultures done on 3/12 pending.  ID following-with recommendations for 3 weeks of continuous ampicillin infusion-followed by oral antimicrobial therapy.  Orthopedics  recommending inpatient monitoring until intraoperative cultures finalized and negative.   Hyperkalemia: Resolved  Hypokalemia: Stable.  Follow.  DM-2 (A1c 4.6 on 3/5): CBG stable-no  longer on Lantus-on SSI.  Recent Labs    09/19/19 2209 09/20/19 0644 09/20/19 1128  GLUCAP 154* 136* 137*   Atypical chest pain: Resolved-troponins negative.  Thrombocytopenia: Mild-secondary to hypersplenism in the setting of liver cirrhosis.  Anemia: Probably secondary to chronic liver disease.  Follow for now.  Obesity: Estimated body mass index is 41.36 kg/m as calculated from the following:   Height as of this encounter: 6' 2.5" (1.892 m).   Weight as of this encounter: 148.1 kg.    Diet: Diet Order            Diet Carb Modified Fluid consistency: Thin; Room service appropriate? Yes  Diet effective now               DVT Prophylaxis: Prophylactic Lovenox   Code Status: Full code   Family Communication: Spouse at bedside on 3/13  Disposition Plan: Home  Barriers to Discharge: Left gluteal abscess needing debridement on 3/12-on IV antibiotics  Antimicrobial agents: Anti-infectives (From admission, onward)   Start     Dose/Rate Route Frequency Ordered Stop   09/19/19 0600  ceFAZolin (ANCEF) 3 g in dextrose 5 % 50 mL IVPB     3 g 100 mL/hr over 30 Minutes Intravenous On call to O.R. 09/18/19 1717 09/19/19 0900   09/19/19 0000  vancomycin (VANCOREADY) IVPB 1750 mg/350 mL  Status:  Discontinued     1,750 mg 175 mL/hr over 120 Minutes Intravenous Every 12 hours 09/18/19 1019 09/18/19 1759   09/18/19 2000  ampicillin (OMNIPEN) 2 g in sodium chloride 0.9 % 100 mL IVPB     2 g 300 mL/hr over 20 Minutes Intravenous Every 4 hours 09/18/19 1801     09/18/19 1100  vancomycin (VANCOREADY) IVPB 2000 mg/400 mL  Status:  Discontinued     2,000 mg 200 mL/hr over 120 Minutes Intravenous STAT 09/18/19 1009 09/18/19 1013   09/18/19 1100  vancomycin (VANCOCIN) 2,500 mg in sodium chloride  0.9 % 500 mL IVPB     2,500 mg 250 mL/hr over 120 Minutes Intravenous STAT 09/18/19 1013 09/18/19 1230   09/13/19 1800  Ampicillin-Sulbactam (UNASYN) 3 g in sodium chloride 0.9 % 100 mL IVPB  Status:  Discontinued     3 g 200 mL/hr over 30 Minutes Intravenous Every 6 hours 09/13/19 1621 09/15/19 1101   09/12/19 2000  vancomycin (VANCOREADY) IVPB 1500 mg/300 mL  Status:  Discontinued     1,500 mg 150 mL/hr over 120 Minutes Intravenous Every 8 hours 09/12/19 1037 09/13/19 1629   09/12/19 1045  vancomycin (VANCOREADY) IVPB 2000 mg/400 mL     2,000 mg 200 mL/hr over 120 Minutes Intravenous  Once 09/12/19 1037 09/12/19 1354      Time spent: 25 minutes-Greater than 50% of this time was spent in counseling, explanation of diagnosis, planning of further management, and coordination of care.  MEDICATIONS: Scheduled Meds: . Chlorhexidine Gluconate Cloth  6 each Topical Daily  . enoxaparin (LOVENOX) injection  40 mg Subcutaneous Q24H  . feeding supplement (PRO-STAT SUGAR FREE 64)  30 mL Oral QID  . furosemide  40 mg Oral Daily  . insulin aspart  0-9 Units Subcutaneous TID WC  . multivitamin with minerals  1 tablet Oral Daily  . potassium chloride  40 mEq Oral Daily  . sodium chloride flush  3 mL Intravenous Once  . spironolactone  200 mg Oral Daily   Continuous Infusions: . sodium chloride 10 mL/hr at 09/19/19 1434  . ampicillin (OMNIPEN) IV 2 g (  09/20/19 1158)  . lactated ringers 10 mL/hr at 09/19/19 0827   PRN Meds:.acetaminophen **OR** acetaminophen, HYDROmorphone (DILAUDID) injection, metoCLOPramide **OR** metoCLOPramide (REGLAN) injection, ondansetron **OR** ondansetron (ZOFRAN) IV, oxyCODONE, sodium chloride flush   PHYSICAL EXAM: Vital signs: Vitals:   09/20/19 0017 09/20/19 0316 09/20/19 0644 09/20/19 0826  BP: (!) 99/53 98/74 105/64 105/64  Pulse: (!) 110 (!) 106 (!) 106 (!) 104  Resp:    17  Temp: 98.2 F (36.8 C) 97.9 F (36.6 C) 98.2 F (36.8 C) 97.8 F (36.6 C)    TempSrc: Oral Oral Oral Oral  SpO2: 96% 97% 97% 98%  Weight:  (!) 148.1 kg    Height:       Filed Weights   09/19/19 0655 09/19/19 0740 09/20/19 0316  Weight: (!) 148.8 kg (!) 148.8 kg (!) 148.1 kg   Body mass index is 41.36 kg/m.   Gen Exam:Alert awake-not in any distress HEENT:atraumatic, normocephalic Chest: B/L clear to auscultation anteriorly CVS:S1S2 regular Abdomen:soft non tender, non distended Extremities:+ edema Neurology: Non focal Skin: no rash  I have personally reviewed following labs and imaging studies  LABORATORY DATA: CBC: Recent Labs  Lab 09/15/19 0240 09/16/19 0346 09/19/19 0412 09/19/19 2124 09/20/19 0307  WBC 3.3* 3.4* 4.1 11.3* 10.9*  HGB 8.5* 9.2* 9.3* 8.1* 8.1*  HCT 27.1* 28.6* 30.3* 25.8* 26.0*  MCV 79.7* 79.7* 82.1 80.6 80.5  PLT 95* 96* 90* 186 876    Basic Metabolic Panel: Recent Labs  Lab 09/16/19 0346 09/16/19 0346 09/17/19 0410 09/18/19 0509 09/19/19 0412 09/19/19 2124 09/20/19 0307  NA 140   < > 141 139 139 135 135  K 4.3   < > 3.4* 3.5 3.6 4.0 4.0  CL 102   < > 102 102 101 102 101  CO2 27   < > 29 30 29 25 25   GLUCOSE 94   < > 115* 116* 123* 171* 143*  BUN <5*   < > 5* 7 10 15 17   CREATININE 0.78   < > 0.73 0.90 0.75 0.94 0.97  CALCIUM 8.2*   < > 8.3* 8.1* 8.2* 7.7* 8.0*  MG 1.7  --   --   --  2.0  --   --    < > = values in this interval not displayed.    GFR: Estimated Creatinine Clearance: 137.7 mL/min (by C-G formula based on SCr of 0.97 mg/dL).  Liver Function Tests: Recent Labs  Lab 09/17/19 0410 09/18/19 0509 09/19/19 0412 09/19/19 2124 09/20/19 0307  AST 43* 42* 40 47* 53*  ALT 14 15 12 15 15   ALKPHOS 87 89 84 81 86  BILITOT 1.0 0.6 0.8 1.3* 1.1  PROT 5.4* 5.5* 5.3* 5.1* 5.4*  ALBUMIN 2.1* 2.1* 2.1* 2.0* 2.1*   No results for input(s): LIPASE, AMYLASE in the last 168 hours. No results for input(s): AMMONIA in the last 168 hours.  Coagulation Profile: Recent Labs  Lab 09/14/19 0353  INR  1.6*    Cardiac Enzymes: No results for input(s): CKTOTAL, CKMB, CKMBINDEX, TROPONINI in the last 168 hours.  BNP (last 3 results) No results for input(s): PROBNP in the last 8760 hours.  Lipid Profile: No results for input(s): CHOL, HDL, LDLCALC, TRIG, CHOLHDL, LDLDIRECT in the last 72 hours.  Thyroid Function Tests: No results for input(s): TSH, T4TOTAL, FREET4, T3FREE, THYROIDAB in the last 72 hours.  Anemia Panel: No results for input(s): VITAMINB12, FOLATE, FERRITIN, TIBC, IRON, RETICCTPCT in the last 72 hours.  Urine analysis:  Component Value Date/Time   COLORURINE AMBER (A) 09/12/2019 1057   APPEARANCEUR HAZY (A) 09/12/2019 1057   LABSPEC 1.030 09/12/2019 1057   PHURINE 5.0 09/12/2019 1057   GLUCOSEU NEGATIVE 09/12/2019 1057   HGBUR NEGATIVE 09/12/2019 1057   BILIRUBINUR NEGATIVE 09/12/2019 1057   KETONESUR NEGATIVE 09/12/2019 1057   PROTEINUR 30 (A) 09/12/2019 1057   NITRITE NEGATIVE 09/12/2019 1057   LEUKOCYTESUR NEGATIVE 09/12/2019 1057    Sepsis Labs: Lactic Acid, Venous    Component Value Date/Time   LATICACIDVEN 2.6 (HH) 06/10/2019 1345    MICROBIOLOGY: Recent Results (from the past 240 hour(s))  Culture, blood (single)     Status: None   Collection Time: 09/12/19 10:50 AM   Specimen: BLOOD RIGHT WRIST  Result Value Ref Range Status   Specimen Description BLOOD RIGHT WRIST  Final   Special Requests   Final    BOTTLES DRAWN AEROBIC AND ANAEROBIC Blood Culture adequate volume   Culture   Final    NO GROWTH 5 DAYS Performed at [facility], [place] [address] [place], [place] [address]    Report Status 09/17/2019 FINAL  Final  SARS CORONAVIRUS 2 (TAT 6-24 HRS) Nasopharyngeal Nasopharyngeal Swab     Status: None   Collection Time: 09/12/19 11:22 AM   Specimen: Nasopharyngeal Swab  Result Value Ref Range Status   SARS Coronavirus 2 NEGATIVE NEGATIVE Final    Comment: (NOTE) SARS-CoV-2 target nucleic acids are NOT DETECTED. The SARS-CoV-2 RNA  is generally detectable in upper and lower respiratory specimens during the acute phase of infection. Negative results do not preclude SARS-CoV-2 infection, do not rule out co-infections with other pathogens, and should not be used as the sole basis for treatment or other patient management decisions. Negative results must be combined with clinical observations, patient history, and epidemiological information. The expected result is Negative. Fact Sheet for Patients: SugarRoll.be Fact Sheet for Healthcare Providers: https://www.woods-mathews.com/ This test is not yet approved or cleared by the [place] FDA and  has been authorized for detection and/or diagnosis of SARS-CoV-2 by FDA under an Emergency Use Authorization (EUA). This EUA will remain  in effect (meaning this test can be used) for the duration of the COVID-19 declaration under Section 56 4(b)(1) of the Act, 21 U.S.C. section 360bbb-3(b)(1), unless the authorization is terminated or revoked sooner. Performed at [facility], [place] [address] [place], [place] [address]   Acid Fast Smear (AFB)     Status: None   Collection Time: 09/13/19 12:31 PM   Specimen: Soft Tissue Abscess  Result Value Ref Range Status   AFB Specimen Processing Concentration  Final   Acid Fast Smear Negative  Final    Comment: (NOTE) Performed At: [facility] [place], [place] [id number] Rush Farmer MD [id number]    Source (AFB) ABSCESS  Final    Comment: GLUTEAL Performed at [facility], [place] [address], [place], [place] [address]   Culture, body fluid-bottle     Status: None   Collection Time: 09/13/19 12:31 PM   Specimen: Abscess  Result Value Ref Range Status   Specimen Description ABSCESS  Final   Special Requests GLUTEAL  Final   Gram Stain   Final    ANAEROBIC BOTTLE ONLY GRAM POSITIVE COCCI CRITICAL RESULT CALLED TO, READ BACK BY AND VERIFIED WITH:  A NEVIUS RN 09/18/19 0101 JDW Performed at [facility], [place] [address] [place], [place] [address]    Culture MODERATE ENTEROCOCCUS  FAECALIS  Final   Report Status 09/19/2019 FINAL  Final   Organism ID, Bacteria ENTEROCOCCUS FAECALIS  Final      Susceptibility   Enterococcus faecalis - MIC*    AMPICILLIN <=2 SENSITIVE Sensitive     VANCOMYCIN 1 SENSITIVE Sensitive     GENTAMICIN SYNERGY SENSITIVE Sensitive     * MODERATE ENTEROCOCCUS FAECALIS  Gram stain     Status: None   Collection Time: 09/13/19 12:31 PM   Specimen: Abscess  Result Value Ref Range Status   Specimen Description ABSCESS  Final   Special Requests GLUTEAL  Final   Gram Stain   Final    CYTOSPIN SMEAR WBC PRESENT, PREDOMINANTLY PMN NO ORGANISMS SEEN Performed at [facility], [place] [address], [place], [place] [address]    Report Status 09/13/2019 FINAL  Final  Fungus culture, blood     Status: None   Collection Time: 09/13/19 12:31 PM   Specimen: Abscess  Result Value Ref Range Status   Specimen Description ABSCESS  Final   Special Requests GLUTEAL  Final   Culture   Final    NO GROWTH 7 DAYS NO FUNGUS ISOLATED Performed at [facility], [address] [place], [place] [address]    Report Status 09/20/2019 FINAL  Final  Culture, body fluid-bottle     Status: None   Collection Time: 09/13/19 12:31 PM   Specimen: Ascitic  Result Value Ref Range Status   Specimen Description ASCITIC  Final   Special Requests NONE  Final   Culture   Final    NO GROWTH 5 DAYS Performed at [place] [address], [place], [place] [address]    Report Status 09/18/2019 FINAL  Final  Gram stain     Status: None   Collection Time: 09/13/19 12:31 PM   Specimen: Ascitic  Result Value Ref Range Status   Specimen Description ASCITIC  Final   Special Requests NONE  Final   Gram Stain   Final    CYTOSPIN SMEAR WBC PRESENT, PREDOMINANTLY MONONUCLEAR NO ORGANISMS SEEN Performed at [place], [place] [address], [place], [place] [address]    Report Status 09/13/2019 FINAL  Final  Surgical pcr screen     Status: None   Collection Time: 09/18/19  5:26 PM   Specimen: Nasal Mucosa; Nasal Swab  Result Value Ref Range Status   MRSA, PCR NEGATIVE NEGATIVE Final   Staphylococcus aureus NEGATIVE NEGATIVE Final    Comment: (NOTE) The Xpert SA Assay (FDA approved for NASAL specimens in patients 21 years of age and older), is one component of a comprehensive surveillance program. It is not intended to diagnose infection nor to guide or monitor treatment. Performed at [facility], [place] [address] [place], [place] [address]   Aerobic/Anaerobic Culture (surgical/deep wound)     Status: None (Preliminary result)   Collection Time: 09/19/19  8:55 AM   Specimen: PATH Cytology Misc. fluid; Body Fluid  Result Value Ref Range Status   Specimen Description TISSUE LEFT HIP  Final   Special Requests A  Final   Gram Stain   Final    ABUNDANT WBC PRESENT, PREDOMINANTLY PMN NO ORGANISMS SEEN    Culture   Final    NO GROWTH < 24 HOURS Performed at [facility], [address] [place], [place] [address]    Report Status PENDING  Incomplete  Aerobic/Anaerobic Culture (surgical/deep wound)     Status: None (  Preliminary result)   Collection Time: 09/19/19 10:31 AM   Specimen: Soft Tissue, Other  Result Value Ref Range Status   Specimen Description FLUID LEFT HIP  Final   Special Requests NONE  Final   Gram Stain   Final    RARE WBC PRESENT, PREDOMINANTLY PMN NO ORGANISMS SEEN    Culture   Final    NO GROWTH < 24 HOURS Performed at [place] [address] [place], [place] [address]    Report Status PENDING  Incomplete    RADIOLOGY STUDIES/RESULTS: [place] EKG SITE RITE  Result Date: 09/19/2019 If Site Rite image not attached, placement could not be confirmed due to current cardiac rhythm.    LOS: 8 days   Oren Binet, MD  [facility]    To contact  the attending provider between 7A-7P or the covering provider during after hours 7P-7A, please log into the web site [email or web address] and access using universal [place] password for that web site. If you do not have the password, please call the hospital operator.  09/20/2019, 1:56 PM

## 2019-09-20 NOTE — Plan of Care (Signed)
  Problem: Clinical Measurements: Goal: Ability to maintain clinical measurements within normal limits will improve 09/20/2019 1550 by Williams Che, RN Outcome: Progressing 09/20/2019 1306 by Williams Che, RN Outcome: Progressing   Problem: Activity: Goal: Risk for activity intolerance will decrease 09/20/2019 1550 by Williams Che, RN Outcome: Progressing 09/20/2019 1306 by Williams Che, RN Outcome: Progressing   Problem: Nutrition: Goal: Adequate nutrition will be maintained 09/20/2019 1550 by Williams Che, RN Outcome: Progressing 09/20/2019 1306 by Williams Che, RN Outcome: Progressing   Problem: Coping: Goal: Level of anxiety will decrease 09/20/2019 1550 by Williams Che, RN Outcome: Progressing 09/20/2019 1306 by Williams Che, RN Outcome: Progressing   Problem: Elimination: Goal: Will not experience complications related to bowel motility 09/20/2019 1550 by Williams Che, RN Outcome: Progressing 09/20/2019 1306 by Williams Che, RN Outcome: Progressing   Problem: Pain Managment: Goal: General experience of comfort will improve 09/20/2019 1550 by Williams Che, RN Outcome: Progressing 09/20/2019 1306 by Williams Che, RN Outcome: Progressing   Problem: Safety: Goal: Ability to remain free from injury will improve 09/20/2019 1550 by Williams Che, RN Outcome: Progressing 09/20/2019 1306 by Williams Che, RN Outcome: Progressing   Problem: Skin Integrity: Goal: Risk for impaired skin integrity will decrease 09/20/2019 1550 by Williams Che, RN Outcome: Progressing 09/20/2019 1306 by Williams Che, RN Outcome: Progressing

## 2019-09-20 NOTE — Progress Notes (Signed)
Patient stated that he prefer the Lasix iv over the pill because it seems to pull off more fluids.  He wanted the MD to be aware when making rounds this morning.  Maybe his dosage of the Lasix pill can be increased.

## 2019-09-20 NOTE — Progress Notes (Signed)
Patient ID: Robert Sullivan, male   DOB: Dec 22, 1965, 54 y.o.   MRN: [id number] Patient is postoperative day 1 repeat irrigation debridement left hip and thigh wound.  The fluid was clear serosanguineous fluid no purulence no odor.  Wound cultures at this time are negative.  There has been a total of 1400 cc of clear serosanguineous drainage since surgery.  Anticipate patient can discharge once cultures are finalized and negative.

## 2019-09-20 NOTE — Progress Notes (Signed)
Patient and wife upset that dietician has not been to see patient, contacted Dr. Sloan Leiter to verify order and there is a note from dietician on 3/10 but did not see patient.  Let dietician know that patient was unhappy and wanted to see her in person, both patient and spouse had questions to ask her.  She said she would be up within the hour.  Patient informed.

## 2019-09-20 NOTE — Progress Notes (Signed)
Patient with increased HR, put patient in [place], when rounding on patient at 1830 earlier he stated that everytime he gets antibiotics his HR goes up, [place], NT in room at time.  During shift change/report, patient stated he wasn't sure if that was why and wanted the doctor to know his HR was up.  Called both IM on call (Dr. Kennon Holter) as well as paged Dr. Sharol Given to make aware.  Dr. Kennon Holter responded, made aware, no new orders.  Patient notified that Dr. Kennon Holter called back and is aware that his HR is up in the 113-117.  No new orders at this time.

## 2019-09-21 LAB — BASIC METABOLIC PANEL
Anion gap: 5 (ref 5–15)
BUN: 23 mg/dL — ABNORMAL HIGH (ref 6–20)
CO2: 28 mmol/L (ref 22–32)
Calcium: 7.9 mg/dL — ABNORMAL LOW (ref 8.9–10.3)
Chloride: 102 mmol/L (ref 98–111)
Creatinine, Ser: 0.82 mg/dL (ref 0.61–1.24)
GFR calc Af Amer: >60 mL/min (ref >60)
GFR calc non Af Amer: >60 mL/min (ref >60)
Glucose, Bld: 150 mg/dL — ABNORMAL HIGH (ref 70–99)
Potassium: 3.9 mmol/L (ref 3.5–5.1)
Sodium: 135 mmol/L (ref 135–145)

## 2019-09-21 LAB — CBC
HCT: 19 % — ABNORMAL LOW (ref 39.0–52.0)
Hemoglobin: 5.8 g/dL — CL (ref 13.0–17.0)
MCH: 24.9 pg — ABNORMAL LOW (ref 26.0–34.0)
MCHC: 30.5 g/dL (ref 30.0–36.0)
MCV: 81.5 fL (ref 80.0–100.0)
Platelets: 95 10*3/uL — ABNORMAL LOW (ref 150–400)
RBC: 2.33 MIL/uL — ABNORMAL LOW (ref 4.22–5.81)
RDW: 16.9 % — ABNORMAL HIGH (ref 11.5–15.5)
WBC: 4.7 10*3/uL (ref 4.0–10.5)
nRBC: 0 % (ref 0.0–0.2)

## 2019-09-21 LAB — GLUCOSE, CAPILLARY
Glucose-Capillary: 128 mg/dL — ABNORMAL HIGH (ref 70–99)
Glucose-Capillary: 156 mg/dL — ABNORMAL HIGH (ref 70–99)
Glucose-Capillary: 144 mg/dL — ABNORMAL HIGH (ref 70–99)
Glucose-Capillary: 110 mg/dL — ABNORMAL HIGH (ref 70–99)

## 2019-09-21 LAB — PROCALCITONIN: Procalcitonin: 0.36 ng/mL

## 2019-09-21 LAB — HEMOGLOBIN AND HEMATOCRIT, BLOOD
HCT: 22.7 % — ABNORMAL LOW (ref 39.0–52.0)
Hemoglobin: 7.1 g/dL — ABNORMAL LOW (ref 13.0–17.0)

## 2019-09-21 LAB — C-REACTIVE PROTEIN: CRP: 3 mg/dL — ABNORMAL HIGH (ref <1.0)

## 2019-09-21 LAB — PREPARE RBC (CROSSMATCH)

## 2019-09-21 LAB — MAGNESIUM: Magnesium: 1.9 mg/dL (ref 1.7–2.4)

## 2019-09-21 MED ORDER — SODIUM CHLORIDE 0.9% IV SOLUTION: Freq: Once | INTRAVENOUS | Status: AC

## 2019-09-21 MED ORDER — ENOXAPARIN SODIUM 40 MG/0.4ML ~~LOC~~ SOLN
40.0000 mg | SUBCUTANEOUS | Status: DC
  Administered 2019-09-22 – 2019-09-25 (×4): 40 mg via SUBCUTANEOUS
  Filled 2019-09-21 (×4): qty 0.4

## 2019-09-21 MED ORDER — DIPHENHYDRAMINE HCL 50 MG/ML IJ SOLN
25.0000 mg | Freq: Four times a day (QID) | INTRAMUSCULAR | Status: DC | PRN
  Filled 2019-09-21: qty 1

## 2019-09-21 MED ORDER — FERROUS SULFATE 325 (65 FE) MG PO TABS
325.0000 mg | ORAL_TABLET | Freq: Three times a day (TID) | ORAL | Status: DC
  Administered 2019-09-21 – 2019-09-25 (×12): 325 mg via ORAL
  Filled 2019-09-21 (×12): qty 1

## 2019-09-21 MED ORDER — FUROSEMIDE 10 MG/ML IJ SOLN
40.0000 mg | Freq: Once | INTRAMUSCULAR | Status: AC
  Administered 2019-09-21: 40 mg via INTRAVENOUS
  Filled 2019-09-21: qty 4

## 2019-09-21 MED ORDER — FOLIC ACID 1 MG PO TABS
1.0000 mg | ORAL_TABLET | Freq: Every day | ORAL | Status: DC
  Administered 2019-09-22 – 2019-09-25 (×4): 1 mg via ORAL
  Filled 2019-09-21 (×5): qty 1

## 2019-09-21 MED ORDER — PANTOPRAZOLE SODIUM 40 MG PO TBEC
40.0000 mg | DELAYED_RELEASE_TABLET | Freq: Every day | ORAL | Status: DC
  Administered 2019-09-21 – 2019-09-25 (×5): 40 mg via ORAL
  Filled 2019-09-21 (×5): qty 1

## 2019-09-21 MED ORDER — DIPHENHYDRAMINE HCL 50 MG/ML IJ SOLN: 25.0000 mg | Freq: Four times a day (QID) | INTRAMUSCULAR | Status: DC | PRN

## 2019-09-21 NOTE — Progress Notes (Addendum)
PROGRESS NOTE        PATIENT DETAILS Name: Robert Sullivan Age: 54 y.o. Sex: male Date of Birth: 1966-02-04 Admit Date: 09/11/2019 Admitting Physician Orson Eva, MD OMB:[id number], Tenna Child, PA-C  Brief Narrative: Patient is a 54 y.o. male with history of Karlene Lineman cirrhosis-recent history of MSSA bacteremia-recurrent left gluteal abscess-s/p numerous readmissions/debridements-referred from GI office for evaluation of anasarca in the setting of decompensated liver cirrhosis.  Was also found to have recurrence of his left gluteal abscess on CT imaging.  Treated with diuretics with significant improvement in his volume status, subsequently evaluated by Dr. Linnell Fulling underwent irrigation and debridement on 3/12.  See below for further details.    Significant events this admit: 3/5>> admit to [facility] from GI office for evaluation of anasarca 3/5>>TTE-EF 60-65% 3/5>>Lower Ext Doppler-no DVT  Procedures this admission: 3/6>> ultrasound guided large-volume paracentesis (9 L) by IR 3/6>> ultrasound-guided aspiration of left gluteal abscess by IR 3/12>> irrigation/debridement of left gluteal area and application of wound VAC 3/13>> PICC line placement  Microbiology this admission 09/19/19>> intraoperative left hip tissue culture: Negative so far 09/13/19>>left gluteus aspiration culture: gm +ve cocci 09/12/19>>blood culture: Negative  Antimicrobial therapy this admit: Ampicillin 3/11>> Vancomycin:3/11>>3/11 Vancomycin:3/4>>3/6 Unasyn:3/6>> 3/8  Consults this admission: IR ID Ortho  Prior relevant admissions: 05/12/19>>05/17/19: Admit for sepsis secondary to UTI, left hip hematoma due to fall. 06/10/2019>> 06/30/2019: Admit for MSSA bacteremia, left gluteal abscess 07/09/2019>> 07/25/2019: Admit by ortho for recurrent left hip abscess-s/p debridement x3  Prior relevant Procedures: 07/18/19>> repeat irrigation and debridement of left hip 07/16/19>> left hip debridement 07/12/19>> left  hip debridement 06/16/19>> TEE-no vegetation 06/17/19>> ultrasound-guided left gluteal abscess drain placement by IR 07/09/19>> I&D of left hip abscess 06/25/19>> I&D of left gluteal abscess 06/24/19>> I&D of left gluteal abscess  Prior microbiology data: 07/16/19>> tissue left hip: Enterococcus faecalis, Finegoldia magna 07/09/19>>Tissue left hip: Enterobacter cloacae 06/17/19>> left gluteal abscess: MSSA 06/10/19>> blood culture: MSSA 06/10/19>> urine culture: MSSA 05/12/19>> urine culture: E. Coli  Subjective:  Patient in bed, appears comfortable, denies any headache, no fever, no chest pain or pressure, no shortness of breath , no abdominal pain. No focal weakness.   Assessment/Plan: Anasarca/ascites/bilateral pleural effusion: Secondary to decompensated liver cirrhosis  UA without evidence of proteinuria, echo with preserved EF.  Underwent large-volume paracentesis (9 L) on 3/6.  Volume status continues to improve-I/O's not charted accurately -but weight decreased to 326 pounds (370 pounds on admission).  Lower extremity Dopplers negative.  Had a brief episode of hypotension yesterday which I suspect was from post anesthesia/narcotics along with Lasix Aldactone.  BP is improving will resume diuretics soon.  Note-patient will be getting IVF with ampicillin infusion-and diuretic regimen may need to be escalated if he starts gaining weight/volume.   Portal hypertension seen on CT imaging: Will need EGD at some point to confirm portal hypertensive gastropathy/survey for varices.  Can be done in the outpatient setting.  Recurrent left gluteal abscess: S/p ultrasound-guided aspiration on 3/6+ for Enterococcus, evaluated by Dr. Sharol Given and underwent repeat irrigation and debridement on 3/12.  Intraoperative left gluteal/hip cultures done on 3/12 pending.  ID following-with recommendations for 3 weeks of continuous ampicillin infusion-followed by oral antimicrobial therapy.  Orthopedics recommending  inpatient monitoring until perioperative wound is much improved.  Anemia: Underlying anemia of chronic disease worse due to perioperative blood loss, 2 units of packed  RBCs ordered on 09/21/2019, will add oral PPI along with oral iron supplementation, no signs of ongoing GI bleed.  Case discussed with orthopedic surgeon Dr. Sharol Given for now continue wound VAC.  Atypical chest pain: Resolved-troponins negative.  Thrombocytopenia: Mild-secondary to hypersplenism in the setting of liver cirrhosis.  Obesity:  BMI 41, follow with PCP.  DM type II.  Currently on sliding scale will monitor and adjust  Lab Results  Component Value Date   HGBA1C 4.6 (L) 09/12/2019   CBG (last 3)  Recent Labs    09/20/19 2040 09/21/19 0635 09/21/19 1138  GLUCAP 146* 128* 156*   .    Diet:  Diet Order            Diet Carb Modified Fluid consistency: Thin; Room service appropriate? Yes  Diet effective now               DVT Prophylaxis: Prophylactic Lovenox  skip on 09/21/2019 for anemia, SCDs added  Code Status: Full code   Family Communication: Spouse at bedside on 3/13, I called wife on 09/21/19 @ 1.32pm no response - left message   Disposition Plan: Stay in the hospital, having continued surgical treatment of left gluteal abscess, H&H dropping perioperatively, requiring transfusions.  Medically not stable.  Thereafter may require SNF versus home health PT for continued extensive wound care.    Antimicrobial agents: Anti-infectives (From admission, onward)   Start     Dose/Rate Route Frequency Ordered Stop   09/19/19 0600  ceFAZolin (ANCEF) 3 g in dextrose 5 % 50 mL IVPB     3 g 100 mL/hr over 30 Minutes Intravenous On call to O.R. 09/18/19 1717 09/19/19 0900   09/19/19 0000  vancomycin (VANCOREADY) IVPB 1750 mg/350 mL  Status:  Discontinued     1,750 mg 175 mL/hr over 120 Minutes Intravenous Every 12 hours 09/18/19 1019 09/18/19 1759   09/18/19 2000  ampicillin (OMNIPEN) 2 g in sodium  chloride 0.9 % 100 mL IVPB     2 g 300 mL/hr over 20 Minutes Intravenous Every 4 hours 09/18/19 1801 10/09/19 2359   09/18/19 1100  vancomycin (VANCOREADY) IVPB 2000 mg/400 mL  Status:  Discontinued     2,000 mg 200 mL/hr over 120 Minutes Intravenous STAT 09/18/19 1009 09/18/19 1013   09/18/19 1100  vancomycin (VANCOCIN) 2,500 mg in sodium chloride 0.9 % 500 mL IVPB     2,500 mg 250 mL/hr over 120 Minutes Intravenous STAT 09/18/19 1013 09/18/19 1230   09/13/19 1800  Ampicillin-Sulbactam (UNASYN) 3 g in sodium chloride 0.9 % 100 mL IVPB  Status:  Discontinued     3 g 200 mL/hr over 30 Minutes Intravenous Every 6 hours 09/13/19 1621 09/15/19 1101   09/12/19 2000  vancomycin (VANCOREADY) IVPB 1500 mg/300 mL  Status:  Discontinued     1,500 mg 150 mL/hr over 120 Minutes Intravenous Every 8 hours 09/12/19 1037 09/13/19 1629   09/12/19 1045  vancomycin (VANCOREADY) IVPB 2000 mg/400 mL     2,000 mg 200 mL/hr over 120 Minutes Intravenous  Once 09/12/19 1037 09/12/19 1354      Time spent: 25 minutes-Greater than 50% of this time was spent in counseling, explanation of diagnosis, planning of further management, and coordination of care.  MEDICATIONS: Scheduled Meds: . sodium chloride   Intravenous Once  . Chlorhexidine Gluconate Cloth  6 each Topical Daily  . [START ON 09/22/2019] enoxaparin (LOVENOX) injection  40 mg Subcutaneous Q24H  . feeding supplement (PRO-STAT SUGAR FREE 64)  30 mL Oral QID  . ferrous sulfate  325 mg Oral TID WC  . folic acid  1 mg Oral Daily  . furosemide  40 mg Intravenous Once  . furosemide  40 mg Oral Daily  . insulin aspart  0-9 Units Subcutaneous TID WC  . multivitamin with minerals  1 tablet Oral Daily  . polyethylene glycol  17 g Oral Daily  . potassium chloride  40 mEq Oral Daily  . sodium chloride flush  3 mL Intravenous Once  . spironolactone  200 mg Oral Daily   Continuous Infusions: . ampicillin (OMNIPEN) IV 2 g (09/21/19 1202)  . lactated ringers  10 mL/hr at 09/19/19 0827   PRN Meds:.acetaminophen **OR** [DISCONTINUED] acetaminophen, diphenhydrAMINE, HYDROmorphone (DILAUDID) injection, [DISCONTINUED] ondansetron **OR** ondansetron (ZOFRAN) IV, oxyCODONE, sodium chloride flush   PHYSICAL EXAM: Vital signs: Vitals:   09/20/19 1932 09/21/19 0434 09/21/19 0436 09/21/19 0806  BP: 125/64 122/66  110/61  Pulse: (!) 103 (!) 101  94  Resp:    18  Temp: 98.6 F (37 C) 97.9 F (36.6 C)  98.2 F (36.8 C)  TempSrc: Oral Oral  Oral  SpO2: 100% 100%  98%  Weight:   (!) 147.6 kg   Height:       Filed Weights   09/19/19 0740 09/20/19 0316 09/21/19 0436  Weight: (!) 148.8 kg (!) 148.1 kg (!) 147.6 kg   Body mass index is 41.23 kg/m.   Gen Exam:  Awake Alert, No new F.N deficits, Normal affect [place].AT,PERRAL Supple Neck,No JVD, No cervical lymphadenopathy appriciated.  Symmetrical Chest wall movement, Good air movement bilaterally, CTAB RRR,No Gallops, Rubs or new Murmurs, No Parasternal Heave +ve B.Sounds, Abd Soft, No tenderness, No organomegaly appriciated, No rebound - guarding or rigidity. No Cyanosis, L LUGTEAL W. Vac   I have personally reviewed following labs and imaging studies  LABORATORY DATA: CBC: Recent Labs  Lab 09/16/19 0346 09/19/19 0412 09/19/19 2124 09/20/19 0307 09/21/19 0835  WBC 3.4* 4.1 11.3* 10.9* 4.7  HGB 9.2* 9.3* 8.1* 8.1* 5.8*  HCT 28.6* 30.3* 25.8* 26.0* 19.0*  MCV 79.7* 82.1 80.6 80.5 81.5  PLT 96* 90* 186 193 95*    Basic Metabolic Panel: Recent Labs  Lab 09/16/19 0346 09/17/19 0410 09/18/19 0509 09/19/19 0412 09/19/19 2124 09/20/19 0307 09/21/19 0835  NA 140   < > 139 139 135 135 135  K 4.3   < > 3.5 3.6 4.0 4.0 3.9  CL 102   < > 102 101 102 101 102  CO2 27   < > 30 29 25 25 28   GLUCOSE 94   < > 116* 123* 171* 143* 150*  BUN <5*   < > 7 10 15 17  23*  CREATININE 0.78   < > 0.90 0.75 0.94 0.97 0.82  CALCIUM 8.2*   < > 8.1* 8.2* 7.7* 8.0* 7.9*  MG 1.7  --   --  2.0  --   --   1.9   < > = values in this interval not displayed.    GFR: Estimated Creatinine Clearance: 162.6 mL/min (by C-G formula based on SCr of 0.82 mg/dL).  Liver Function Tests: Recent Labs  Lab 09/17/19 0410 09/18/19 0509 09/19/19 0412 09/19/19 2124 09/20/19 0307  AST 43* 42* 40 47* 53*  ALT 14 15 12 15 15   ALKPHOS 87 89 84 81 86  BILITOT 1.0 0.6 0.8 1.3* 1.1  PROT 5.4* 5.5* 5.3* 5.1* 5.4*  ALBUMIN 2.1* 2.1* 2.1* 2.0* 2.1*  No results for input(s): LIPASE, AMYLASE in the last 168 hours. No results for input(s): AMMONIA in the last 168 hours.  Coagulation Profile: No results for input(s): INR, PROTIME in the last 168 hours.  Cardiac Enzymes: No results for input(s): CKTOTAL, CKMB, CKMBINDEX, TROPONINI in the last 168 hours.  BNP (last 3 results) No results for input(s): PROBNP in the last 8760 hours.  Lipid Profile: No results for input(s): CHOL, HDL, LDLCALC, TRIG, CHOLHDL, LDLDIRECT in the last 72 hours.  Thyroid Function Tests: No results for input(s): TSH, T4TOTAL, FREET4, T3FREE, THYROIDAB in the last 72 hours.  Anemia Panel: No results for input(s): VITAMINB12, FOLATE, FERRITIN, TIBC, IRON, RETICCTPCT in the last 72 hours.  Urine analysis:    Component Value Date/Time   COLORURINE AMBER (A) 09/12/2019 1057   APPEARANCEUR HAZY (A) 09/12/2019 1057   LABSPEC 1.030 09/12/2019 1057   PHURINE 5.0 09/12/2019 1057   GLUCOSEU NEGATIVE 09/12/2019 1057   HGBUR NEGATIVE 09/12/2019 1057   BILIRUBINUR NEGATIVE 09/12/2019 1057   KETONESUR NEGATIVE 09/12/2019 1057   PROTEINUR 30 (A) 09/12/2019 1057   NITRITE NEGATIVE 09/12/2019 1057   LEUKOCYTESUR NEGATIVE 09/12/2019 1057    Sepsis Labs: Lactic Acid, Venous    Component Value Date/Time   LATICACIDVEN 2.6 (HH) 06/10/2019 1345    MICROBIOLOGY: Recent Results (from the past 240 hour(s))  Culture, blood (single)     Status: None   Collection Time: 09/12/19 10:50 AM   Specimen: BLOOD RIGHT WRIST  Result Value Ref  Range Status   Specimen Description BLOOD RIGHT WRIST  Final   Special Requests   Final    BOTTLES DRAWN AEROBIC AND ANAEROBIC Blood Culture adequate volume   Culture   Final    NO GROWTH 5 DAYS Performed at [facility], [place] [address] [place], [place] [address]    Report Status 09/17/2019 FINAL  Final  SARS CORONAVIRUS 2 (TAT 6-24 HRS) Nasopharyngeal Nasopharyngeal Swab     Status: None   Collection Time: 09/12/19 11:22 AM   Specimen: Nasopharyngeal Swab  Result Value Ref Range Status   SARS Coronavirus 2 NEGATIVE NEGATIVE Final    Comment: (NOTE) SARS-CoV-2 target nucleic acids are NOT DETECTED. The SARS-CoV-2 RNA is generally detectable in upper and lower respiratory specimens during the acute phase of infection. Negative results do not preclude SARS-CoV-2 infection, do not rule out co-infections with other pathogens, and should not be used as the sole basis for treatment or other patient management decisions. Negative results must be combined with clinical observations, patient history, and epidemiological information. The expected result is Negative. Fact Sheet for Patients: SugarRoll.be Fact Sheet for Healthcare Providers: https://www.woods-mathews.com/ This test is not yet approved or cleared by the [place] FDA and  has been authorized for detection and/or diagnosis of SARS-CoV-2 by FDA under an Emergency Use Authorization (EUA). This EUA will remain  in effect (meaning this test can be used) for the duration of the COVID-19 declaration under Section 56 4(b)(1) of the Act, 21 U.S.C. section 360bbb-3(b)(1), unless the authorization is terminated or revoked sooner. Performed at [facility], [place] [address], [place], [place] [address]   Acid Fast Smear (AFB)     Status: None   Collection Time: 09/13/19 12:31 PM   Specimen: Soft Tissue Abscess  Result Value Ref Range Status   AFB Specimen Processing Concentration   Final   Acid Fast Smear Negative  Final    Comment: (NOTE) Performed At: [place] [address]  [place], [place] [id number] Rush Farmer MD [id number]    Source (AFB) ABSCESS  Final    Comment: GLUTEAL Performed at [facility], [place] [address], [place], [place] [address]   Culture, body fluid-bottle     Status: None   Collection Time: 09/13/19 12:31 PM   Specimen: Abscess  Result Value Ref Range Status   Specimen Description ABSCESS  Final   Special Requests GLUTEAL  Final   Gram Stain   Final    ANAEROBIC BOTTLE ONLY GRAM POSITIVE COCCI CRITICAL RESULT CALLED TO, READ BACK BY AND VERIFIED WITH: A NEVIUS RN 09/18/19 0101 JDW Performed at [facility], [place] [address], [place], [place] [address]    Culture MODERATE ENTEROCOCCUS FAECALIS  Final   Report Status 09/19/2019 FINAL  Final   Organism ID, Bacteria ENTEROCOCCUS FAECALIS  Final      Susceptibility   Enterococcus faecalis - MIC*    AMPICILLIN <=2 SENSITIVE Sensitive     VANCOMYCIN 1 SENSITIVE Sensitive     GENTAMICIN SYNERGY SENSITIVE Sensitive     * MODERATE ENTEROCOCCUS FAECALIS  Gram stain     Status: None   Collection Time: 09/13/19 12:31 PM   Specimen: Abscess  Result Value Ref Range Status   Specimen Description ABSCESS  Final   Special Requests GLUTEAL  Final   Gram Stain   Final    CYTOSPIN SMEAR WBC PRESENT, PREDOMINANTLY PMN NO ORGANISMS SEEN Performed at [facility], [place] [address], [place], [place] [address]    Report Status 09/13/2019 FINAL  Final  Fungus culture, blood     Status: None   Collection Time: 09/13/19 12:31 PM   Specimen: Abscess  Result Value Ref Range Status   Specimen Description ABSCESS  Final   Special Requests GLUTEAL  Final   Culture   Final    NO GROWTH 7 DAYS NO FUNGUS ISOLATED Performed at [facility], [address] [place], [place] [address]    Report Status 09/20/2019 FINAL  Final  Culture, body fluid-bottle     Status: None     Collection Time: 09/13/19 12:31 PM   Specimen: Ascitic  Result Value Ref Range Status   Specimen Description ASCITIC  Final   Special Requests NONE  Final   Culture   Final    NO GROWTH 5 DAYS Performed at [place] [address], [place], [place] [address]    Report Status 09/18/2019 FINAL  Final  Gram stain     Status: None   Collection Time: 09/13/19 12:31 PM   Specimen: Ascitic  Result Value Ref Range Status   Specimen Description ASCITIC  Final   Special Requests NONE  Final   Gram Stain   Final    CYTOSPIN SMEAR WBC PRESENT, PREDOMINANTLY MONONUCLEAR NO ORGANISMS SEEN Performed at [facility], [place] [address], [place], [place] [address]    Report Status 09/13/2019 FINAL  Final  Surgical pcr screen     Status: None   Collection Time: 09/18/19  5:26 PM   Specimen: Nasal Mucosa; Nasal Swab  Result Value Ref Range Status   MRSA, PCR NEGATIVE NEGATIVE Final   Staphylococcus aureus NEGATIVE NEGATIVE Final    Comment: (NOTE) The Xpert SA Assay (FDA approved for NASAL specimens in patients 26 years of age and older), is one component of a comprehensive surveillance program. It is not intended to diagnose infection nor to guide or monitor treatment. Performed at [facility], [place] [address] [place], [place] [address]   Aerobic/Anaerobic Culture (surgical/deep wound)     Status: None (Preliminary result)   Collection Time: 09/19/19  8:55 AM   Specimen: PATH Cytology Misc. fluid; Body Fluid  Result Value Ref Range Status   Specimen Description TISSUE LEFT HIP  Final   Special Requests A  Final   Gram Stain   Final    ABUNDANT WBC PRESENT, PREDOMINANTLY PMN NO ORGANISMS SEEN    Culture   Final    NO GROWTH 2 DAYS Performed at [facility], [address] [place], [place] [address]    Report Status PENDING  Incomplete  Aerobic/Anaerobic Culture (surgical/deep wound)     Status: None (Preliminary result)   Collection Time: 09/19/19 10:31  AM   Specimen: Soft Tissue, Other  Result Value Ref Range Status   Specimen Description FLUID LEFT HIP  Final   Special Requests NONE  Final   Gram Stain   Final    RARE WBC PRESENT, PREDOMINANTLY PMN NO ORGANISMS SEEN    Culture   Final    NO GROWTH 2 DAYS Performed at [facility], [place] [address] [place], [place] [address]    Report Status PENDING  Incomplete    RADIOLOGY STUDIES/RESULTS: No results found.   LOS: 9 days   Signature  Lala Lund M.D on 09/21/2019 at 1:24 PM   -  To page go to [email or web address]

## 2019-09-21 NOTE — Plan of Care (Signed)

## 2019-09-21 NOTE — Progress Notes (Signed)
Pt compeleted 1 unit PRBC transfusion, no adverse reactions noted, vital signs taken and recorded, 2nd Unit PRBC started, will continue to monitor.

## 2019-09-21 NOTE — Plan of Care (Signed)

## 2019-09-21 NOTE — Progress Notes (Signed)
RT instructed patient on the use of the flutter valve and incentive spirometer.  Patient was able to demonstrate back good technique with both devices.

## 2019-09-21 NOTE — Progress Notes (Signed)
PT Cancellation Note  Patient Details Name: Robert Sullivan MRN: [id number] DOB: Apr 27, 1966   Cancelled Treatment:    Reason Eval/Treat Not Completed: Medical issues which prohibited therapy--hgb 5.8. Will hold PT today.      Doreatha Massed, PT Acute Rehabilitation

## 2019-09-21 NOTE — Progress Notes (Signed)
Occupational Therapy Evaluation Patient Details Name: Robert Sullivan MRN: [id number] DOB: 1965/08/31 Today's Date: 09/21/2019    History of Present Illness 54 year old male with recent history of fall and subsequent gluteal abscess. Pt with recent hospitalization for this, has undergone IR drainage and x6 surgical I&Ds Dec 2020-Jan 2021 with excision of necrotic gluteal. Pt readmitted 3/4 with shortness of breath, fluid collection L gluteal soft tissue (pending culture), cirrhosis with ascites s/p paracentesis and L thigh aspiration 3/6. Left hip debridement and wound VAC placement 3/12. PMHx:DM2, OA, septic shock, obesity, and arthritis.   Clinical Impression   PTA pt resided with wife, independent in ADLs and mobility. Pt reports that wife completes IADLs. Pt ambulates with crutches or RW depending on how he feels. Pt reports 2 falls in the last 6 months. Pt currently independent to mod assist for self-care and functional transfer tasks. Pt modified independent with bed mobility tasks, able to sit edge of bed ~10 min. Pt unable to don/doff socks due to pain and limited ROM. Pt declined education on use of AE reporting that wife can assist him at home. Pt tolerated standing x 2 with variable supervision to min guard to ensure balance and safety. Pt reported increased dizziness upon standing with vitals monitored (see BP readings below). Educated pt on safety strategies and fall prevention techniques with fair understanding and follow through. Pt demonstrates decreased strength, endurance, balance, standing tolerance, and activity tolerance impacting ability to complete self-care and functional transfer tasks. Recommend skilled OT services to address above deficits in order to promote function and prevent further decline.   BP sitting: 125/26mHg BP standing: 106/636mg. Pt symptomatic reporting increased dizziness.  HR 94bpm semi reclined to 125bpm seated EOB to 136bpm standing    Follow Up  Recommendations  No OT follow up;Supervision - Intermittent    Equipment Recommendations  None recommended by OT    Recommendations for Other Services       Precautions / Restrictions Precautions Precautions: Fall;Other (comment) Precaution Comments: Wound VAC L LE. Monitor HR and BP - orthostatic hypotension Restrictions Weight Bearing Restrictions: Yes LLE Weight Bearing: Weight bearing as tolerated      Mobility Bed Mobility Overal bed mobility: Modified Independent             General bed mobility comments: HOB elevated, use of bedrails.   Transfers Overall transfer level: Needs assistance Equipment used: None Transfers: Sit to/from Stand Sit to Stand: Supervision;Min guard         General transfer comment: To ensure balance and safety due to pt reporting dizziness with positional changes    Balance Overall balance assessment: Mild deficits observed, not formally tested;History of Falls                                         ADL either performed or assessed with clinical judgement   ADL Overall ADL's : Needs assistance/impaired Eating/Feeding: Independent;Sitting   Grooming: Set up;Sitting   Upper Body Bathing: Set up;Sitting   Lower Body Bathing: Moderate assistance;Sitting/lateral leans;Sit to/from stand   Upper Body Dressing : Set up;Sitting   Lower Body Dressing: Moderate assistance;Sitting/lateral leans;Sit to/from stand   Toilet Transfer: Min guard;BSC   Toileting- ClWater quality scientistnd Hygiene: Min guard;Sit to/from stand;Sitting/lateral lean;Supervision/safety       Functional mobility during ADLs: Supervision/safety;Min guard General ADL Comments: Pt's activity tolerance limited due to orthostatic hypotension with pt symptomatic.  Pt tolerated standing x 2 with variable supervision to min guard to ensure safety. Noted 0 instances of LOB.      Vision         Perception     Praxis      Pertinent Vitals/Pain  Pain Assessment: 0-10 Pain Score: 3  Pain Location: L LE: lateral thigh, surgical site Pain Descriptors / Indicators: Sore(with movement) Pain Intervention(s): Monitored during session     Hand Dominance Right   Extremity/Trunk Assessment Upper Extremity Assessment Upper Extremity Assessment: Overall WFL for tasks assessed   Lower Extremity Assessment Lower Extremity Assessment: Defer to PT evaluation       Communication Communication Communication: No difficulties   Cognition Arousal/Alertness: Awake/alert Behavior During Therapy: (Frustrated) Overall Cognitive Status: Within Functional Limits for tasks assessed                                 General Comments: Pt frustrated with medical status and length of hospital stay.    General Comments  HR increased from 94bpm semi reclined to 125 seated EOB to 136 standing. Pt orthostatic with BP dropping in standing and pt symptomatic.     Exercises     Shoulder Instructions      Home Living Family/patient expects to be discharged to:: Private residence Living Arrangements: Children;Spouse/significant other Available Help at Discharge: Family;Available 24 hours/day Type of Home: House Home Access: Stairs to enter [employer] of Steps: 4 Entrance Stairs-Rails: None Home Layout: Two level;Bed/bath upstairs Alternate Level Stairs-Number of Steps: flight of stairs Alternate Level Stairs-Rails: Right Bathroom Shower/Tub: Occupational psychologist: Standard     Home Equipment: Environmental consultant - 2 wheels;Crutches;Shower seat          Prior Functioning/Environment Level of Independence: Independent with assistive device(s)        Comments: Pt reports being independent in ADLs PTA. Pt still drives. Wife assists with IADLs. Pt ambulates with crutches or RW, depending on how he's feeling. Pt reports 2 falls in the last 6 months.        OT Problem List: Decreased activity tolerance;Impaired  balance (sitting and/or standing);Decreased safety awareness;Decreased strength      OT Treatment/Interventions: Self-care/ADL training;Therapeutic exercise;Neuromuscular education;Energy conservation;DME and/or AE instruction;Therapeutic activities;Patient/family education;Balance training    OT Goals(Current goals can be found in the care plan section) Acute Rehab OT Goals Patient Stated Goal: To go home Time For Goal Achievement: 10/05/19 Potential to Achieve Goals: Good ADL Goals Additional ADL Goal #1: Pt to complete all ADLs with modified independence. Additional ADL Goal #2: Pt to tolerate standing up to 5 min with modified independence in preparation for ADLs. Additional ADL Goal #3: Pt to recall and verbalize 3 fall prevention strategies with 0 verbal cues.  OT Frequency: Min 2X/week   Barriers to D/C:            Co-evaluation              AM-PAC OT "6 Clicks" Daily Activity     Outcome Measure Help from another person eating meals?: None Help from another person taking care of personal grooming?: A Little Help from another person toileting, which includes using toliet, bedpan, or urinal?: A Little Help from another person bathing (including washing, rinsing, drying)?: A Little Help from another person to put on and taking off regular upper body clothing?: A Little Help from another person to put on and taking off regular  lower body clothing?: A Lot 6 Click Score: 18   End of Session Nurse Communication: Mobility status  Activity Tolerance: Other (comment)(Limited by dizziness - orthostatic hypotension) Patient left: in bed;with call bell/phone within reach  OT Visit Diagnosis: Unsteadiness on feet (R26.81);Repeated falls (R29.6)                Time: 8473-0856 OT Time Calculation (min): 25 min Charges:  OT General Charges $OT Visit: 1 Visit OT Evaluation $OT Eval Low Complexity: 1 Low OT Treatments $Therapeutic Activity: 8-22 mins  Mauri Brooklyn  OTR/L [phone]  Mauri Brooklyn 09/21/2019, 9:05 AM

## 2019-09-21 NOTE — Progress Notes (Signed)
CRITICAL VALUE ALERT  Critical Value:  5.8 Hgb  Date & Time Notied:  09/21/19 09:30  Provider Notified: Dr. Candiss Norse  Orders Received/Actions taken: MD ordered 2units PRBC transfusion

## 2019-09-22 LAB — BPAM RBC
Blood Product Expiration Date: 202104112359
Blood Product Expiration Date: 202104112359
ISSUE DATE / TIME: 202103141406
ISSUE DATE / TIME: 202103141748
Unit Type and Rh: 6200
Unit Type and Rh: 6200

## 2019-09-22 LAB — TYPE AND SCREEN
ABO/RH(D): A POS
Antibody Screen: NEGATIVE
Unit division: 0
Unit division: 0

## 2019-09-22 LAB — CBC WITH DIFFERENTIAL/PLATELET
Abs Immature Granulocytes: 0.03 10*3/uL (ref 0.00–0.07)
Basophils Absolute: 0 10*3/uL (ref 0.0–0.1)
Basophils Relative: 1 %
Eosinophils Absolute: 0.3 10*3/uL (ref 0.0–0.5)
Eosinophils Relative: 7 %
HCT: 22.5 % — ABNORMAL LOW (ref 39.0–52.0)
Hemoglobin: 7.1 g/dL — ABNORMAL LOW (ref 13.0–17.0)
Immature Granulocytes: 1 %
Lymphocytes Relative: 31 %
Lymphs Abs: 1.2 10*3/uL (ref 0.7–4.0)
MCH: 26 pg (ref 26.0–34.0)
MCHC: 31.6 g/dL (ref 30.0–36.0)
MCV: 82.4 fL (ref 80.0–100.0)
Monocytes Absolute: 0.5 10*3/uL (ref 0.1–1.0)
Monocytes Relative: 13 %
Neutro Abs: 2 10*3/uL (ref 1.7–7.7)
Neutrophils Relative %: 47 %
Platelets: 94 10*3/uL — ABNORMAL LOW (ref 150–400)
RBC: 2.73 MIL/uL — ABNORMAL LOW (ref 4.22–5.81)
RDW: 16.4 % — ABNORMAL HIGH (ref 11.5–15.5)
WBC: 4.1 10*3/uL (ref 4.0–10.5)
nRBC: 0 % (ref 0.0–0.2)

## 2019-09-22 LAB — BASIC METABOLIC PANEL
Anion gap: 5 (ref 5–15)
BUN: 19 mg/dL (ref 6–20)
CO2: 28 mmol/L (ref 22–32)
Calcium: 7.8 mg/dL — ABNORMAL LOW (ref 8.9–10.3)
Chloride: 103 mmol/L (ref 98–111)
Creatinine, Ser: 0.84 mg/dL (ref 0.61–1.24)
GFR calc Af Amer: >60 mL/min (ref >60)
GFR calc non Af Amer: >60 mL/min (ref >60)
Glucose, Bld: 111 mg/dL — ABNORMAL HIGH (ref 70–99)
Potassium: 3.9 mmol/L (ref 3.5–5.1)
Sodium: 136 mmol/L (ref 135–145)

## 2019-09-22 LAB — C-REACTIVE PROTEIN: CRP: 2.3 mg/dL — ABNORMAL HIGH (ref <1.0)

## 2019-09-22 LAB — MAGNESIUM: Magnesium: 1.9 mg/dL (ref 1.7–2.4)

## 2019-09-22 LAB — GLUCOSE, CAPILLARY
Glucose-Capillary: 109 mg/dL — ABNORMAL HIGH (ref 70–99)
Glucose-Capillary: 138 mg/dL — ABNORMAL HIGH (ref 70–99)
Glucose-Capillary: 122 mg/dL — ABNORMAL HIGH (ref 70–99)
Glucose-Capillary: 135 mg/dL — ABNORMAL HIGH (ref 70–99)

## 2019-09-22 LAB — PROCALCITONIN: Procalcitonin: 0.31 ng/mL

## 2019-09-22 MED ORDER — FUROSEMIDE 40 MG PO TABS: 40.0000 mg | ORAL_TABLET | Freq: Two times a day (BID) | ORAL | Status: DC

## 2019-09-22 MED ORDER — SPIRONOLACTONE 25 MG PO TABS
100.0000 mg | ORAL_TABLET | Freq: Every day | ORAL | Status: DC
  Administered 2019-09-23 – 2019-09-25 (×3): 100 mg via ORAL
  Filled 2019-09-22 (×3): qty 4

## 2019-09-22 MED ORDER — FUROSEMIDE 40 MG PO TABS
60.0000 mg | ORAL_TABLET | Freq: Two times a day (BID) | ORAL | Status: DC
  Administered 2019-09-22 – 2019-09-23 (×3): 60 mg via ORAL
  Filled 2019-09-22 (×3): qty 1

## 2019-09-22 NOTE — Progress Notes (Signed)
Patient ID: Robert Sullivan, male   DOB: 03/26/1966, 54 y.o.   MRN: [id number] Is postoperative repeat debridement of seroma left hip.  Patient states that his body is filling up with fluid again.  There is only been 50 cc of drainage of the wound VAC canister over the past 2 days.  Once the cultures are finalized and there is no drainage in the wound VAC canister patient can be discharged from an orthopedic standpoint with the portable Praveena wound VAC pump.  I discussed with the patient the importance of ambulating with physical therapy today.

## 2019-09-22 NOTE — Plan of Care (Signed)
  Problem: Clinical Measurements: Goal: Cardiovascular complication will be avoided Outcome: Progressing   

## 2019-09-22 NOTE — Progress Notes (Signed)
PROGRESS NOTE        PATIENT DETAILS Name: Robert Sullivan Age: 54 y.o. Sex: male Date of Birth: Dec 01, 1965 Admit Date: 09/11/2019 Admitting Physician Orson Eva, MD HWE:[id number], Tenna Child, PA-C  Brief Narrative: Patient is a 54 y.o. male with history of Karlene Lineman cirrhosis-recent history of MSSA bacteremia-recurrent left gluteal abscess-s/p numerous readmissions/debridements-referred from GI office for evaluation of anasarca in the setting of decompensated liver cirrhosis.  Was also found to have recurrence of his left gluteal abscess on CT imaging.  Treated with diuretics with significant improvement in his volume status, subsequently evaluated by Dr. Linnell Fulling underwent irrigation and debridement on 3/12.  See below for further details.    Significant events this admit: 3/5>> admit to [facility] from GI office for evaluation of anasarca 3/5>>TTE-EF 60-65% 3/5>>Lower Ext Doppler-no DVT  Procedures this admission: 3/6>> ultrasound guided large-volume paracentesis (9 L) by IR 3/6>> ultrasound-guided aspiration of left gluteal abscess by IR 3/12>> irrigation/debridement of left gluteal area and application of wound VAC 3/13>> PICC line placement  Microbiology this admission 09/19/19>> intraoperative left hip tissue culture: Negative so far 09/13/19>>left gluteus aspiration culture: gm +ve cocci 09/12/19>>blood culture: Negative  Antimicrobial therapy this admit: Ampicillin 3/11>> Vancomycin:3/11>>3/11 Vancomycin:3/4>>3/6 Unasyn:3/6>> 3/8  Consults this admission: IR ID Ortho  Prior relevant admissions: 05/12/19>>05/17/19: Admit for sepsis secondary to UTI, left hip hematoma due to fall. 06/10/2019>> 06/30/2019: Admit for MSSA bacteremia, left gluteal abscess 07/09/2019>> 07/25/2019: Admit by ortho for recurrent left hip abscess-s/p debridement x3  Prior relevant Procedures: 07/18/19>> repeat irrigation and debridement of left hip 07/16/19>> left hip debridement 07/12/19>> left  hip debridement 06/16/19>> TEE-no vegetation 06/17/19>> ultrasound-guided left gluteal abscess drain placement by IR 07/09/19>> I&D of left hip abscess 06/25/19>> I&D of left gluteal abscess 06/24/19>> I&D of left gluteal abscess  Prior microbiology data: 07/16/19>> tissue left hip: Enterococcus faecalis, Finegoldia magna 07/09/19>>Tissue left hip: Enterobacter cloacae 06/17/19>> left gluteal abscess: MSSA 06/10/19>> blood culture: MSSA 06/10/19>> urine culture: MSSA 05/12/19>> urine culture: E. Coli  Subjective:  Patient in bed, appears comfortable, denies any headache, no fever, no chest pain or pressure, no shortness of breath , no abdominal pain. No focal weakness.   Assessment/Plan:  Recurrent left gluteal abscess: S/p ultrasound-guided aspiration on 3/6+ for Enterococcus, evaluated by Dr. Sharol Given and underwent repeat irrigation and debridement on 3/12.  Intraoperative left gluteal/hip cultures done on 3/12 remain negative till 09/22/2019.  ID following-with recommendations for 3 weeks of continuous ampicillin infusion-PICC line placed 09/19/2019 stop date for IV infusion will be for 10/09/2019.  Orthopedics recommending inpatient monitoring until perioperative wound is much improved.  Anasarca/ascites/bilateral pleural effusion: Secondary to decompensated liver cirrhosis  UA without evidence of proteinuria, echo with preserved EF.  Underwent large-volume paracentesis (9 L) on 3/6.  Volume status continues to improve-I/O's not charted accurately -but weight decreased to 326 pounds (370 pounds on admission).  Lower extremity Dopplers negative.  Had a brief episode of hypotension yesterday which I suspect was from post anesthesia/narcotics along with Lasix Aldactone.  BP is improving will resume diuretics soon.  Note-patient will be getting IVF with ampicillin infusion-and diuretic regimen may need to be escalated if he starts gaining weight/volume.  Portal hypertension seen on CT imaging: Will need  EGD at some point to confirm portal hypertensive gastropathy/survey for varices.  Can be done in the outpatient setting.  Anemia: Underlying anemia of chronic  disease worse due to perioperative blood loss, 2 units of packed RBCs ordered on 09/21/2019, will add oral PPI along with oral iron supplementation, no signs of ongoing GI bleed.  Case discussed with orthopedic surgeon Dr. Sharol Given for now continue wound VAC.  Atypical chest pain: Resolved-troponins negative.  Thrombocytopenia: Mild-secondary to hypersplenism in the setting of liver cirrhosis.  Obesity:  BMI 41, follow with PCP.  DM type II.  Currently on sliding scale will monitor and adjust  Lab Results  Component Value Date   HGBA1C 4.6 (L) 09/12/2019   CBG (last 3)  Recent Labs    09/21/19 2134 09/22/19 0635 09/22/19 1123  GLUCAP 110* 109* 138*   .    Diet:  Diet Order            Diet Carb Modified Fluid consistency: Thin; Room service appropriate? Yes  Diet effective now               DVT Prophylaxis: Prophylactic Lovenox  & SCDs    Code Status: Full code   Family Communication:  Spouse at bedside on 3/13, I called wife on 09/21/19 @ 1.32pm no response - left message, called 09/22/19 -at 8 AM from patient's own phone in his room, no response, again called 11.36am - no response.   Disposition Plan: Stay in the hospital, having continued surgical treatment of left gluteal abscess, H&H dropping perioperatively, requiring transfusions.  Medically not stable.  Thereafter may require SNF versus home health PT for continued extensive wound care.    Antimicrobial agents: Anti-infectives (From admission, onward)   Start     Dose/Rate Route Frequency Ordered Stop   09/19/19 0600  ceFAZolin (ANCEF) 3 g in dextrose 5 % 50 mL IVPB     3 g 100 mL/hr over 30 Minutes Intravenous On call to O.R. 09/18/19 1717 09/19/19 0900   09/19/19 0000  vancomycin (VANCOREADY) IVPB 1750 mg/350 mL  Status:  Discontinued     1,750  mg 175 mL/hr over 120 Minutes Intravenous Every 12 hours 09/18/19 1019 09/18/19 1759   09/18/19 2000  ampicillin (OMNIPEN) 2 g in sodium chloride 0.9 % 100 mL IVPB     2 g 300 mL/hr over 20 Minutes Intravenous Every 4 hours 09/18/19 1801 10/09/19 2359   09/18/19 1100  vancomycin (VANCOREADY) IVPB 2000 mg/400 mL  Status:  Discontinued     2,000 mg 200 mL/hr over 120 Minutes Intravenous STAT 09/18/19 1009 09/18/19 1013   09/18/19 1100  vancomycin (VANCOCIN) 2,500 mg in sodium chloride 0.9 % 500 mL IVPB     2,500 mg 250 mL/hr over 120 Minutes Intravenous STAT 09/18/19 1013 09/18/19 1230   09/13/19 1800  Ampicillin-Sulbactam (UNASYN) 3 g in sodium chloride 0.9 % 100 mL IVPB  Status:  Discontinued     3 g 200 mL/hr over 30 Minutes Intravenous Every 6 hours 09/13/19 1621 09/15/19 1101   09/12/19 2000  vancomycin (VANCOREADY) IVPB 1500 mg/300 mL  Status:  Discontinued     1,500 mg 150 mL/hr over 120 Minutes Intravenous Every 8 hours 09/12/19 1037 09/13/19 1629   09/12/19 1045  vancomycin (VANCOREADY) IVPB 2000 mg/400 mL     2,000 mg 200 mL/hr over 120 Minutes Intravenous  Once 09/12/19 1037 09/12/19 1354      Time spent: 25 minutes-Greater than 50% of this time was spent in counseling, explanation of diagnosis, planning of further management, and coordination of care.  MEDICATIONS: Scheduled Meds: . Chlorhexidine Gluconate Cloth  6 each Topical Daily  .  enoxaparin (LOVENOX) injection  40 mg Subcutaneous Q24H  . feeding supplement (PRO-STAT SUGAR FREE 64)  30 mL Oral QID  . ferrous sulfate  325 mg Oral TID WC  . folic acid  1 mg Oral Daily  . furosemide  60 mg Oral BID  . insulin aspart  0-9 Units Subcutaneous TID WC  . multivitamin with minerals  1 tablet Oral Daily  . pantoprazole  40 mg Oral Daily  . polyethylene glycol  17 g Oral Daily  . potassium chloride  40 mEq Oral Daily  . sodium chloride flush  3 mL Intravenous Once  . [START ON 09/23/2019] spironolactone  100 mg Oral Daily    Continuous Infusions: . ampicillin (OMNIPEN) IV 2 g (09/22/19 0815)   PRN Meds:.acetaminophen **OR** [DISCONTINUED] acetaminophen, diphenhydrAMINE, HYDROmorphone (DILAUDID) injection, [DISCONTINUED] ondansetron **OR** ondansetron (ZOFRAN) IV, oxyCODONE, sodium chloride flush   PHYSICAL EXAM: Vital signs: Vitals:   09/21/19 1821 09/21/19 2017 09/22/19 0422 09/22/19 0826  BP: 118/64 (!) 117/52 (!) 99/52 107/73  Pulse: 93 92 85 (!) 105  Resp: 16   18  Temp: 98.2 F (36.8 C) 98.3 F (36.8 C) 97.7 F (36.5 C) 98.2 F (36.8 C)  TempSrc: Oral Oral Oral Oral  SpO2: 100% 99% 97% 97%  Weight:   (!) 151.7 kg   Height:       Filed Weights   09/20/19 0316 09/21/19 0436 09/22/19 0422  Weight: (!) 148.1 kg (!) 147.6 kg (!) 151.7 kg   Body mass index is 42.36 kg/m.   Gen Exam:  Awake Alert, No new F.N deficits, Normal affect [place].AT,PERRAL Supple Neck,No JVD, No cervical lymphadenopathy appriciated.  Symmetrical Chest wall movement, Good air movement bilaterally, CTAB RRR,No Gallops, Rubs or new Murmurs, No Parasternal Heave +ve B.Sounds, Abd Soft, No tenderness, No organomegaly appriciated, No rebound - guarding or rigidity. No Cyanosis, trace edema, L LUGTEAL W. Vac   I have personally reviewed following labs and imaging studies  LABORATORY DATA: CBC: Recent Labs  Lab 09/19/19 0412 09/19/19 0412 09/19/19 2124 09/20/19 0307 09/21/19 0835 09/21/19 2324 09/22/19 0336  WBC 4.1  --  11.3* 10.9* 4.7  --  4.1  NEUTROABS  --   --   --   --   --   --  2.0  HGB 9.3*   < > 8.1* 8.1* 5.8* 7.1* 7.1*  HCT 30.3*   < > 25.8* 26.0* 19.0* 22.7* 22.5*  MCV 82.1  --  80.6 80.5 81.5  --  82.4  PLT 90*  --  186 193 95*  --  94*   < > = values in this interval not displayed.    Basic Metabolic Panel: Recent Labs  Lab 09/16/19 0346 09/17/19 0410 09/19/19 0412 09/19/19 2124 09/20/19 0307 09/21/19 0835 09/22/19 0336  NA 140   < > 139 135 135 135 136  K 4.3   < > 3.6 4.0 4.0 3.9  3.9  CL 102   < > 101 102 101 102 103  CO2 27   < > 29 25 25 28 28   GLUCOSE 94   < > 123* 171* 143* 150* 111*  BUN <5*   < > 10 15 17  23* 19  CREATININE 0.78   < > 0.75 0.94 0.97 0.82 0.84  CALCIUM 8.2*   < > 8.2* 7.7* 8.0* 7.9* 7.8*  MG 1.7  --  2.0  --   --  1.9 1.9   < > = values in this interval not displayed.  GFR: Estimated Creatinine Clearance: 161.1 mL/min (by C-G formula based on SCr of 0.84 mg/dL).  Liver Function Tests: Recent Labs  Lab 09/17/19 0410 09/18/19 0509 09/19/19 0412 09/19/19 2124 09/20/19 0307  AST 43* 42* 40 47* 53*  ALT 14 15 12 15 15   ALKPHOS 87 89 84 81 86  BILITOT 1.0 0.6 0.8 1.3* 1.1  PROT 5.4* 5.5* 5.3* 5.1* 5.4*  ALBUMIN 2.1* 2.1* 2.1* 2.0* 2.1*   No results for input(s): LIPASE, AMYLASE in the last 168 hours. No results for input(s): AMMONIA in the last 168 hours.  Coagulation Profile: No results for input(s): INR, PROTIME in the last 168 hours.  Cardiac Enzymes: No results for input(s): CKTOTAL, CKMB, CKMBINDEX, TROPONINI in the last 168 hours.  BNP (last 3 results) No results for input(s): PROBNP in the last 8760 hours.  Lipid Profile: No results for input(s): CHOL, HDL, LDLCALC, TRIG, CHOLHDL, LDLDIRECT in the last 72 hours.  Thyroid Function Tests: No results for input(s): TSH, T4TOTAL, FREET4, T3FREE, THYROIDAB in the last 72 hours.  Anemia Panel: No results for input(s): VITAMINB12, FOLATE, FERRITIN, TIBC, IRON, RETICCTPCT in the last 72 hours.  Urine analysis:    Component Value Date/Time   COLORURINE AMBER (A) 09/12/2019 1057   APPEARANCEUR HAZY (A) 09/12/2019 1057   LABSPEC 1.030 09/12/2019 1057   PHURINE 5.0 09/12/2019 1057   GLUCOSEU NEGATIVE 09/12/2019 1057   HGBUR NEGATIVE 09/12/2019 1057   BILIRUBINUR NEGATIVE 09/12/2019 1057   KETONESUR NEGATIVE 09/12/2019 1057   PROTEINUR 30 (A) 09/12/2019 1057   NITRITE NEGATIVE 09/12/2019 1057   LEUKOCYTESUR NEGATIVE 09/12/2019 1057    Sepsis Labs: Lactic Acid,  Venous    Component Value Date/Time   LATICACIDVEN 2.6 (HH) 06/10/2019 1345    MICROBIOLOGY: Recent Results (from the past 240 hour(s))  Culture, blood (single)     Status: None   Collection Time: 09/12/19 10:50 AM   Specimen: BLOOD RIGHT WRIST  Result Value Ref Range Status   Specimen Description BLOOD RIGHT WRIST  Final   Special Requests   Final    BOTTLES DRAWN AEROBIC AND ANAEROBIC Blood Culture adequate volume   Culture   Final    NO GROWTH 5 DAYS Performed at [facility], [place] [address] [place], [place] [address]    Report Status 09/17/2019 FINAL  Final  SARS CORONAVIRUS 2 (TAT 6-24 HRS) Nasopharyngeal Nasopharyngeal Swab     Status: None   Collection Time: 09/12/19 11:22 AM   Specimen: Nasopharyngeal Swab  Result Value Ref Range Status   SARS Coronavirus 2 NEGATIVE NEGATIVE Final    Comment: (NOTE) SARS-CoV-2 target nucleic acids are NOT DETECTED. The SARS-CoV-2 RNA is generally detectable in upper and lower respiratory specimens during the acute phase of infection. Negative results do not preclude SARS-CoV-2 infection, do not rule out co-infections with other pathogens, and should not be used as the sole basis for treatment or other patient management decisions. Negative results must be combined with clinical observations, patient history, and epidemiological information. The expected result is Negative. Fact Sheet for Patients: SugarRoll.be Fact Sheet for Healthcare Providers: https://www.woods-mathews.com/ This test is not yet approved or cleared by the [place] FDA and  has been authorized for detection and/or diagnosis of SARS-CoV-2 by FDA under an Emergency Use Authorization (EUA). This EUA will remain  in effect (meaning this test can be used) for the duration of the COVID-19 declaration under Section 56 4(b)(1) of the Act, 21 U.S.C. section 360bbb-3(b)(1), unless the authorization is terminated  or revoked sooner. Performed at [facility], [place] [address] [place], [place] [address]   Acid Fast Smear (AFB)     Status: None   Collection Time: 09/13/19 12:31 PM   Specimen: Soft Tissue Abscess  Result Value Ref Range Status   AFB Specimen Processing Concentration  Final   Acid Fast Smear Negative  Final    Comment: (NOTE) Performed At: [facility] [place], [place] [id number] Rush Farmer MD JA:[id number]    Source (AFB) ABSCESS  Final    Comment: GLUTEAL Performed at [facility], [place] [address] [place], [place] [address]   Culture, body fluid-bottle     Status: None   Collection Time: 09/13/19 12:31 PM   Specimen: Abscess  Result Value Ref Range Status   Specimen Description ABSCESS  Final   Special Requests GLUTEAL  Final   Gram Stain   Final    ANAEROBIC BOTTLE ONLY GRAM POSITIVE COCCI CRITICAL RESULT CALLED TO, READ BACK BY AND VERIFIED WITH: A NEVIUS RN 09/18/19 0101 JDW Performed at [facility], [place] [address], [place], [place] [address]    Culture MODERATE ENTEROCOCCUS FAECALIS  Final   Report Status 09/19/2019 FINAL  Final   Organism ID, Bacteria ENTEROCOCCUS FAECALIS  Final      Susceptibility   Enterococcus faecalis - MIC*    AMPICILLIN <=2 SENSITIVE Sensitive     VANCOMYCIN 1 SENSITIVE Sensitive     GENTAMICIN SYNERGY SENSITIVE Sensitive     * MODERATE ENTEROCOCCUS FAECALIS  Gram stain     Status: None   Collection Time: 09/13/19 12:31 PM   Specimen: Abscess  Result Value Ref Range Status   Specimen Description ABSCESS  Final   Special Requests GLUTEAL  Final   Gram Stain   Final    CYTOSPIN SMEAR WBC PRESENT, PREDOMINANTLY PMN NO ORGANISMS SEEN Performed at [facility], [place] [address], [place], [place] [address]    Report Status 09/13/2019 FINAL  Final  Fungus culture, blood     Status: None   Collection Time: 09/13/19 12:31 PM   Specimen: Abscess  Result Value Ref Range Status   Specimen  Description ABSCESS  Final   Special Requests GLUTEAL  Final   Culture   Final    NO GROWTH 7 DAYS NO FUNGUS ISOLATED Performed at [facility], [address], [place], [place] [address]    Report Status 09/20/2019 FINAL  Final  Culture, body fluid-bottle     Status: None   Collection Time: 09/13/19 12:31 PM   Specimen: Ascitic  Result Value Ref Range Status   Specimen Description ASCITIC  Final   Special Requests NONE  Final   Culture   Final    NO GROWTH 5 DAYS Performed at [facility] [address] [place], [place] [address]    Report Status 09/18/2019 FINAL  Final  Gram stain     Status: None   Collection Time: 09/13/19 12:31 PM   Specimen: Ascitic  Result Value Ref Range Status   Specimen Description ASCITIC  Final   Special Requests NONE  Final   Gram Stain   Final    CYTOSPIN SMEAR WBC PRESENT, PREDOMINANTLY MONONUCLEAR NO ORGANISMS SEEN Performed at [facility], [place] [address], [place], [place] [address]    Report Status 09/13/2019 FINAL  Final  Surgical pcr screen     Status: None   Collection Time: 09/18/19  5:26  PM   Specimen: Nasal Mucosa; Nasal Swab  Result Value Ref Range Status   MRSA, PCR NEGATIVE NEGATIVE Final   Staphylococcus aureus NEGATIVE NEGATIVE Final    Comment: (NOTE) The Xpert SA Assay (FDA approved for NASAL specimens in patients 64 years of age and older), is one component of a comprehensive surveillance program. It is not intended to diagnose infection nor to guide or monitor treatment. Performed at [facility], [place] [address] [place], [place] [address]   Aerobic/Anaerobic Culture (surgical/deep wound)     Status: None (Preliminary result)   Collection Time: 09/19/19  8:55 AM   Specimen: PATH Cytology Misc. fluid; Body Fluid  Result Value Ref Range Status   Specimen Description TISSUE LEFT HIP  Final   Special Requests A  Final   Gram Stain   Final    ABUNDANT WBC PRESENT, PREDOMINANTLY PMN NO ORGANISMS  SEEN    Culture   Final    NO GROWTH 2 DAYS Performed at [facility], [address], [place], [place] [address]    Report Status PENDING  Incomplete  Aerobic/Anaerobic Culture (surgical/deep wound)     Status: None (Preliminary result)   Collection Time: 09/19/19 10:31 AM   Specimen: Soft Tissue, Other  Result Value Ref Range Status   Specimen Description FLUID LEFT HIP  Final   Special Requests NONE  Final   Gram Stain   Final    RARE WBC PRESENT, PREDOMINANTLY PMN NO ORGANISMS SEEN    Culture   Final    NO GROWTH 2 DAYS Performed at [facility], [place] [address], [place], [place] [address]    Report Status PENDING  Incomplete    RADIOLOGY STUDIES/RESULTS: No results found.   LOS: 10 days   Signature  Lala Lund M.D on 09/22/2019 at 11:26 AM   -  To page go to [email or web address]

## 2019-09-22 NOTE — TOC Progression Note (Addendum)
Transition of Care [facility]) - Progression Note    Patient Details  Name: Robert Sullivan MRN: [id number] Date of Birth: 25-Jul-1965  [facility] CM/SW Contact  Sharin Mons, RN Phone Number: [phone] 09/22/2019, 8:50 AM  Clinical Narrative:     Pt will transition to home with family once medically ready.  TOC team will continue to monitor and follow for needs.    Expected Discharge Plan: Home/Self Care Barriers to Discharge: Continued Medical Work up(tissue cultures pending from wound, watching fld volume from wound vac, IV ABX)  Expected Discharge Plan and Services Expected Discharge Plan: Home/Self Care   Discharge Planning Services: CM Consult   Living arrangements for the past 2 months: Single Family Home                                       Social Determinants of Health (SDOH) Interventions    Readmission Risk Interventions Readmission Risk Prevention Plan 09/16/2019  Transportation Screening Complete  PCP or Specialist Appt within 3-5 Days Complete  HRI or [place] Patient refused  Social Work Consult for [place] Planning/Counseling Patient refused  Palliative Care Screening Not Applicable  Medication Review Press photographer) Complete

## 2019-09-22 NOTE — Progress Notes (Signed)
PHARMACY CONSULT NOTE FOR:  OUTPATIENT  PARENTERAL ANTIBIOTIC THERAPY (OPAT)  Indication: E faecalis gluteal abscess Regimen: Ampicillin 12 gm every 24 hours as a continuous infusion End date: 10/09/19  IV antibiotic discharge orders are pended. To discharging provider:  please sign these orders via discharge navigator,  Select New Orders & click on the button choice - Manage This Unsigned Work.     Thank you for allowing pharmacy to be a part of this patient's care.  Jimmy Footman, PharmD, BCPS, [place] [facility] Clinical Pharmacist Phone: [phone] 09/22/2019, 12:39 PM

## 2019-09-22 NOTE — Progress Notes (Signed)
Physical Therapy Treatment Patient Details Name: Robert Sullivan MRN: [id number] DOB: 01-14-1966 Today's Date: 09/22/2019    History of Present Illness 54 year old male with recent history of fall and subsequent gluteal abscess. Pt with recent hospitalization for this, has undergone IR drainage and x6 surgical I&Ds Dec 2020-Jan 2021 with excision of necrotic gluteal. Pt readmitted 3/4 with shortness of breath, fluid collection L gluteal soft tissue (pending culture), cirrhosis with ascites s/p paracentesis and L thigh aspiration 3/6. Left hip debridement and wound VAC placement 3/12. PMHx:DM2, OA, septic shock, obesity, and arthritis.    PT Comments    Pt very familiar from prior admissions and moving well. Pt able to tolerate gait of 300' without AD and perform standing HEP. Pt limited by increasing anasarca and frustration with medical status and admissions. Pt encouraged to continue mobility acutely as well as HEP.     Follow Up Recommendations  Outpatient PT     Equipment Recommendations  None recommended by PT    Recommendations for Other Services       Precautions / Restrictions Precautions Precautions: Other (comment) Precaution Comments: Wound VAC L LE. Monitor HR and BP Restrictions LLE Weight Bearing: Weight bearing as tolerated    Mobility  Bed Mobility Overal bed mobility: Modified Independent Bed Mobility: Supine to Sit;Sit to Supine              Transfers Overall transfer level: Needs assistance   Transfers: Sit to/from Stand Sit to Stand: Supervision         General transfer comment: supervision for lines  Ambulation/Gait Ambulation/Gait assistance: Modified independent (Device/Increase time) Gait Distance (Feet): 300 Feet Assistive device: IV Pole Gait Pattern/deviations: Step-through pattern;Wide base of support   Gait velocity interpretation: 1.31 - 2.62 ft/sec, indicative of limited community ambulator General Gait Details: pt pushing iv pole  but not reliant on support   Stairs             Wheelchair Mobility    Modified Rankin (Stroke Patients Only)       Balance Overall balance assessment: No apparent balance deficits (not formally assessed)                                          Cognition Arousal/Alertness: Awake/alert Behavior During Therapy: WFL for tasks assessed/performed Overall Cognitive Status: Within Functional Limits for tasks assessed                                        Exercises Total Joint Exercises Standing Hip Extension: AROM;Both;Standing;10 reps General Exercises - Lower Extremity Hip ABduction/ADduction: AROM;10 reps;Standing;Both Hip Flexion/Marching: AROM;Both;Standing;10 reps    General Comments        Pertinent Vitals/Pain Pain Score: 3  Pain Location: L LE: lateral thigh, surgical site Pain Descriptors / Indicators: Sore Pain Intervention(s): Limited activity within patient's tolerance;Repositioned    Home Living                      Prior Function            PT Goals (current goals can now be found in the care plan section) Progress towards PT goals: Progressing toward goals    Frequency    Min 3X/week      PT Plan Current plan remains appropriate  Co-evaluation              AM-PAC PT "6 Clicks" Mobility   Outcome Measure  Help needed turning from your back to your side while in a flat bed without using bedrails?: None Help needed moving from lying on your back to sitting on the side of a flat bed without using bedrails?: None Help needed moving to and from a bed to a chair (including a wheelchair)?: A Little Help needed standing up from a chair using your arms (e.g., wheelchair or bedside chair)?: A Little Help needed to walk in hospital room?: A Little Help needed climbing 3-5 steps with a railing? : A Little 6 Click Score: 20    End of Session   Activity Tolerance: Patient tolerated treatment  well Patient left: in bed;with call bell/phone within reach Nurse Communication: Mobility status PT Visit Diagnosis: Other abnormalities of gait and mobility (R26.89);Muscle weakness (generalized) (M62.81)     Time: 3710-6269 PT Time Calculation (min) (ACUTE ONLY): 28 min  Charges:  $Gait Training: 8-22 mins $Therapeutic Exercise: 8-22 mins                     [place], PT Acute Rehabilitation Services Pager: [phone] Office: [place] 09/22/2019, 12:47 PM

## 2019-09-22 NOTE — Plan of Care (Signed)
  Problem: Activity: Goal: Risk for activity intolerance will decrease Outcome: Progressing   Problem: Safety: Goal: Ability to remain free from injury will improve Outcome: Progressing   

## 2019-09-23 LAB — COMPREHENSIVE METABOLIC PANEL
ALT: 18 U/L (ref 0–44)
AST: 62 U/L — ABNORMAL HIGH (ref 15–41)
Albumin: 2 g/dL — ABNORMAL LOW (ref 3.5–5.0)
Alkaline Phosphatase: 87 U/L (ref 38–126)
Anion gap: 6 (ref 5–15)
BUN: 17 mg/dL (ref 6–20)
CO2: 28 mmol/L (ref 22–32)
Calcium: 7.9 mg/dL — ABNORMAL LOW (ref 8.9–10.3)
Chloride: 103 mmol/L (ref 98–111)
Creatinine, Ser: 0.88 mg/dL (ref 0.61–1.24)
GFR calc Af Amer: >60 mL/min (ref >60)
GFR calc non Af Amer: >60 mL/min (ref >60)
Glucose, Bld: 121 mg/dL — ABNORMAL HIGH (ref 70–99)
Potassium: 3.9 mmol/L (ref 3.5–5.1)
Sodium: 137 mmol/L (ref 135–145)
Total Bilirubin: 1.1 mg/dL (ref 0.3–1.2)
Total Protein: 5.3 g/dL — ABNORMAL LOW (ref 6.5–8.1)

## 2019-09-23 LAB — CBC WITH DIFFERENTIAL/PLATELET
Abs Immature Granulocytes: 0.01 10*3/uL (ref 0.00–0.07)
Basophils Absolute: 0 10*3/uL (ref 0.0–0.1)
Basophils Relative: 1 %
Eosinophils Absolute: 0.3 10*3/uL (ref 0.0–0.5)
Eosinophils Relative: 6 %
HCT: 22.8 % — ABNORMAL LOW (ref 39.0–52.0)
Hemoglobin: 7.1 g/dL — ABNORMAL LOW (ref 13.0–17.0)
Immature Granulocytes: 0 %
Lymphocytes Relative: 30 %
Lymphs Abs: 1.2 10*3/uL (ref 0.7–4.0)
MCH: 25.8 pg — ABNORMAL LOW (ref 26.0–34.0)
MCHC: 31.1 g/dL (ref 30.0–36.0)
MCV: 82.9 fL (ref 80.0–100.0)
Monocytes Absolute: 0.5 10*3/uL (ref 0.1–1.0)
Monocytes Relative: 13 %
Neutro Abs: 2.1 10*3/uL (ref 1.7–7.7)
Neutrophils Relative %: 50 %
Platelets: 93 10*3/uL — ABNORMAL LOW (ref 150–400)
RBC: 2.75 MIL/uL — ABNORMAL LOW (ref 4.22–5.81)
RDW: 16.9 % — ABNORMAL HIGH (ref 11.5–15.5)
WBC: 4.2 10*3/uL (ref 4.0–10.5)
nRBC: 0 % (ref 0.0–0.2)

## 2019-09-23 LAB — GLUCOSE, CAPILLARY
Glucose-Capillary: 108 mg/dL — ABNORMAL HIGH (ref 70–99)
Glucose-Capillary: 141 mg/dL — ABNORMAL HIGH (ref 70–99)
Glucose-Capillary: 119 mg/dL — ABNORMAL HIGH (ref 70–99)
Glucose-Capillary: 116 mg/dL — ABNORMAL HIGH (ref 70–99)

## 2019-09-23 LAB — PROCALCITONIN: Procalcitonin: 0.24 ng/mL

## 2019-09-23 LAB — MAGNESIUM: Magnesium: 1.8 mg/dL (ref 1.7–2.4)

## 2019-09-23 LAB — C-REACTIVE PROTEIN: CRP: 1.9 mg/dL — ABNORMAL HIGH (ref <1.0)

## 2019-09-23 MED ORDER — FUROSEMIDE 10 MG/ML IJ SOLN: 60.0000 mg | Freq: Two times a day (BID) | INTRAMUSCULAR | Status: DC

## 2019-09-23 MED ORDER — MAGNESIUM SULFATE IN D5W 1-5 GM/100ML-% IV SOLN
1.0000 g | Freq: Once | INTRAVENOUS | Status: AC
  Administered 2019-09-23: 1 g via INTRAVENOUS
  Filled 2019-09-23 (×2): qty 100

## 2019-09-23 MED ORDER — METOLAZONE 2.5 MG PO TABS
2.5000 mg | ORAL_TABLET | Freq: Once | ORAL | Status: DC
  Filled 2019-09-23: qty 1

## 2019-09-23 MED ORDER — FUROSEMIDE 10 MG/ML IJ SOLN
60.0000 mg | Freq: Every day | INTRAMUSCULAR | Status: DC
  Administered 2019-09-24: 08:00:00 60 mg via INTRAVENOUS
  Filled 2019-09-23 (×2): qty 6

## 2019-09-23 NOTE — Progress Notes (Signed)
Clarified order from Dr. Sharol Given and received an order to remove wound vac and apply dressing.

## 2019-09-23 NOTE — Plan of Care (Signed)
  Problem: Education: Goal: Knowledge of General Education information will improve Description: Including pain rating scale, medication(s)/side effects and non-pharmacologic comfort measures Outcome: Adequate for Discharge   

## 2019-09-23 NOTE — Progress Notes (Signed)
Patient doing well. Has adopted a plant based diet and has a follow up with a liver specialist at [facility].   VSS HGB 7.1 50 cc in VAC. Has ambulated 300 feet with PT  From ortho standpoint can DC no further surgical Intervention planned. Should follow  up in office 1 week. DC with prevena pump

## 2019-09-23 NOTE — Progress Notes (Signed)
Occupational Therapy Treatment Patient Details Name: Robert Sullivan MRN: [id number] DOB: [birth year] Today's Date: 09/23/2019    History of present illness 54 year old male with recent history of fall and subsequent gluteal abscess. Pt with recent hospitalization for this, has undergone IR drainage and x6 surgical I&Ds Dec 2020-Jan 2021 with excision of necrotic gluteal. Pt readmitted 3/4 with shortness of breath, fluid collection L gluteal soft tissue (pending culture), cirrhosis with ascites s/p paracentesis and L thigh aspiration 3/6. Left hip debridement and wound VAC placement 3/12. PMHx:DM2, OA, septic shock, obesity, and arthritis.   OT comments  Pt limited this session due to BP (see below), RN notified and aware. Pt required assistance to don socks while sitting EOB. Continued to discuss possible use of AE to assist with LB dressing. Pt reported he would prefer to work on flexibility and have assistance from family at this time.  Pt required minguard for standing this session with report of dizziness, requiring return to sitting EOB. Pt requesting to sleep and reports he has not had much sleep or rest. Discussed this with RN and placed sign on pts door to allow time to rest. Pt will continue to benefit from skilled OT services to maximize safety and independence with ADL/IADL and functional mobility. Will continue to follow acutely and progress as tolerated.   Supine: BP 97/57 HR 98 Sitting EOB 48mn: 87/40 HR 108 (no report of dizziness, pt asymptomatic) Sitting EOB 3363m: 134/77 Hr 112 Standing 63m75m 124/60 HR 108 (pt symptomatic, required return to sitting)     Follow Up Recommendations  No OT follow up;Supervision - Intermittent    Equipment Recommendations  None recommended by OT    Recommendations for Other Services      Precautions / Restrictions Precautions Precautions: Other (comment) Precaution Comments: wound vac removed 3/16;monitor HR and BP Restrictions Weight Bearing  Restrictions: Yes LLE Weight Bearing: Weight bearing as tolerated       Mobility Bed Mobility Overal bed mobility: Modified Independent Bed Mobility: Supine to Sit;Sit to Supine           General bed mobility comments: HOB elevated, use of bedrails.   Transfers Overall transfer level: Needs assistance Equipment used: None Transfers: Sit to/from Stand Sit to Stand: Min guard         General transfer comment: minguard for safety    Balance Overall balance assessment: No apparent balance deficits (not formally assessed) Sitting-balance support: No upper extremity supported;Feet supported Sitting balance-Leahy Scale: Good     Standing balance support: No upper extremity supported Standing balance-Leahy Scale: Good Standing balance comment: Pt felt dizzy and required seated rest break                           ADL either performed or assessed with clinical judgement   ADL Overall ADL's : Needs assistance/impaired                     Lower Body Dressing: Moderate assistance;Sitting/lateral leans;Sit to/from stand Lower Body Dressing Details (indicate cue type and reason): assist to don socks on bilat. feet; pt reports he wants to work on flexibility as opposed to use of AE     Toileting- CloWater quality scientistd Hygiene: Min guard;Sit to/from stand Toileting - Clothing Manipulation Details (indicate cue type and reason): pt reported dizziness in standing     Functional mobility during ADLs: Min guard General ADL Comments: minguard to stand, pt reported dizziness with initial  standing required return to sitting;pt reports he has not gotten much sleep or rest today, discussed putting sign on door for pt to rest with RN approval     Vision       Perception     Praxis      Cognition Arousal/Alertness: Awake/alert Behavior During Therapy: WFL for tasks assessed/performed Overall Cognitive Status: Within Functional Limits for tasks assessed                                           Exercises     Shoulder Instructions       General Comments      Pertinent Vitals/ Pain       Pain Assessment: 0-10 Faces Pain Scale: Hurts little more Pain Location: L LE: lateral thigh, surgical site Pain Descriptors / Indicators: Sore Pain Intervention(s): Limited activity within patient's tolerance  Home Living                                          Prior Functioning/Environment              Frequency  Min 2X/week        Progress Toward Goals  OT Goals(current goals can now be found in the care plan section)  Progress towards OT goals: Not progressing toward goals - comment(limited by BP)  Acute Rehab OT Goals Patient Stated Goal: to get some sleep OT Goal Formulation: With patient Time For Goal Achievement: 10/05/19 Potential to Achieve Goals: Good ADL Goals Additional ADL Goal #1: Pt to complete all ADLs with modified independence. Additional ADL Goal #2: Pt to tolerate standing up to 5 min with modified independence in preparation for ADLs. Additional ADL Goal #3: Pt to recall and verbalize 3 fall prevention strategies with 0 verbal cues.  Plan Discharge plan remains appropriate    Co-evaluation                 AM-PAC OT "6 Clicks" Daily Activity     Outcome Measure   Help from another person eating meals?: None Help from another person taking care of personal grooming?: A Little Help from another person toileting, which includes using toliet, bedpan, or urinal?: A Little Help from another person bathing (including washing, rinsing, drying)?: A Little Help from another person to put on and taking off regular upper body clothing?: A Little Help from another person to put on and taking off regular lower body clothing?: A Lot 6 Click Score: 18    End of Session    OT Visit Diagnosis: Unsteadiness on feet (R26.81);Repeated falls (R29.6)   Activity Tolerance Other  (comment)(Limited by dizziness )   Patient Left in bed;with call bell/phone within reach   Nurse Communication Mobility status        Time: 4628-6381 OT Time Calculation (min): 32 min  Charges: OT General Charges $OT Visit: 1 Visit OT Treatments $Self Care/Home Management : 23-37 mins  Helene Kelp OTR/L Acute Rehabilitation Services Office: [place] 09/23/2019, 2:03 PM

## 2019-09-23 NOTE — Progress Notes (Signed)
PROGRESS NOTE        PATIENT DETAILS Name: Robert Sullivan Age: 54 y.o. Sex: male Date of Birth: [birth year] Admit Date: 09/11/2019 Admitting Physician Orson Eva, MD NHA:[id number], Tenna Child, PA-C  Brief Narrative: Patient is a 54 y.o. male with history of Karlene Lineman cirrhosis-recent history of MSSA bacteremia-recurrent left gluteal abscess-s/p numerous readmissions/debridements-referred from GI office for evaluation of anasarca in the setting of decompensated liver cirrhosis.  Was also found to have recurrence of his left gluteal abscess on CT imaging.  Treated with diuretics with significant improvement in his volume status, subsequently evaluated by Dr. Linnell Fulling underwent irrigation and debridement on 3/12.  See below for further details.    Significant events this admit: 3/5>> admit to [facility] from GI office for evaluation of anasarca 3/5>>TTE-EF 60-65% 3/5>>Lower Ext Doppler-no DVT  Procedures this admission: 3/6>> ultrasound guided large-volume paracentesis (9 L) by IR 3/6>> ultrasound-guided aspiration of left gluteal abscess by IR 3/12>> irrigation/debridement of left gluteal area and application of wound VAC 3/13>> PICC line placement  Microbiology this admission 09/19/19>> intraoperative left hip tissue culture: Negative so far 09/13/19>>left gluteus aspiration culture: gm +ve cocci 09/12/19>>blood culture: Negative  Antimicrobial therapy this admit: Ampicillin 3/11>> Vancomycin:3/11>>3/11 Vancomycin:3/4>>3/6 Unasyn:3/6>> 3/8  Consults this admission: IR ID Ortho  Prior relevant admissions: 05/12/19>>05/17/19: Admit for sepsis secondary to UTI, left hip hematoma due to fall. 06/10/2019>> 06/30/2019: Admit for MSSA bacteremia, left gluteal abscess 07/09/2019>> 07/25/2019: Admit by ortho for recurrent left hip abscess-s/p debridement x3  Prior relevant Procedures: 07/18/19>> repeat irrigation and debridement of left hip 07/16/19>> left hip debridement 07/12/19>> left  hip debridement 06/16/19>> TEE-no vegetation 06/17/19>> ultrasound-guided left gluteal abscess drain placement by IR 07/09/19>> I&D of left hip abscess 06/25/19>> I&D of left gluteal abscess 06/24/19>> I&D of left gluteal abscess  Prior microbiology data: 07/16/19>> tissue left hip: Enterococcus faecalis, Finegoldia magna 07/09/19>>Tissue left hip: Enterobacter cloacae 06/17/19>> left gluteal abscess: MSSA 06/10/19>> blood culture: MSSA 06/10/19>> urine culture: MSSA 05/12/19>> urine culture: E. Coli  Subjective:  Patient in bed, appears comfortable, denies any headache, no fever, no chest pain or pressure, no shortness of breath , no abdominal pain. No focal weakness.  Assessment/Plan:  Recurrent left gluteal abscess: S/p ultrasound-guided aspiration on 3/6+ for Enterococcus, evaluated by Dr. Sharol Given and underwent repeat irrigation and debridement on 3/12.  Intraoperative left gluteal/hip cultures done on 3/12 remain negative till 09/22/2019.  ID following-with recommendations for 3 weeks of continuous ampicillin infusion-PICC line placed 09/19/2019 stop date for IV infusion will be for 10/09/2019.  Orthopedics recommending inpatient monitoring until perioperative wound is much improved.  As discussed with orthopedic surgeon Dr. Sharol Given on 09/23/2019, he will remove the wound VAC today and then monitor him for any signs of infection over the next 1-2 days.  Anasarca/ascites/bilateral pleural effusion: Secondary to decompensated liver cirrhosis  UA without evidence of proteinuria, echo with preserved EF.  Underwent large-volume paracentesis (9 L) on 3/6.  Volume status continues to improve-I/O's not charted accurately -but weight decreased to 326 pounds (370 pounds on admission).  Lower extremity Dopplers negative.  Had a brief episode of hypotension yesterday which I suspect was from post anesthesia/narcotics along with Lasix Aldactone.  BP is improving will resume diuretics soon.  Note-patient will be  getting IVF with ampicillin infusion-and diuretic regimen may need to be escalated if he starts gaining weight/volume.  Portal hypertension seen  on CT imaging: Will need EGD at some point to confirm portal hypertensive gastropathy/survey for varices.  Can be done in the outpatient setting.  Anemia: Underlying anemia of chronic disease worse due to perioperative blood loss, 2 units of packed RBCs ordered on 09/21/2019, will add oral PPI along with oral iron supplementation, no signs of ongoing GI bleed.  Case discussed with orthopedic surgeon Dr. Sharol Given for now continue wound VAC.  Atypical chest pain: Resolved-troponins negative.  Thrombocytopenia: Mild-secondary to hypersplenism in the setting of liver cirrhosis.  Obesity:  BMI 41, follow with PCP.  DM type II.  Currently on sliding scale will monitor and adjust  Lab Results  Component Value Date   HGBA1C 4.6 (L) 09/12/2019   CBG (last 3)  Recent Labs    09/22/19 1608 09/22/19 2015 09/23/19 0642  GLUCAP 122* 135* 108*   .    Diet:  Diet Order            Diet Carb Modified Fluid consistency: Thin; Room service appropriate? Yes; Fluid restriction: 1500 mL Fluid  Diet effective now               DVT Prophylaxis: Prophylactic Lovenox  & SCDs    Code Status: Full code   Family Communication:  Spouse at bedside on 3/13, I called wife on 09/21/19 @ 1.32pm no response - left message, called 09/22/19 -at 8 AM from patient's own phone in his room, no response, again called 11.36am - no response.   Disposition Plan: Stay in the hospital, having continued surgical treatment of left gluteal abscess, H&H dropping perioperatively, requiring transfusions.  Medically not stable.  Likely discharge with home health in the next 1 to 2 days if he remains stable after the wound VAC removal.    Antimicrobial agents: Anti-infectives (From admission, onward)   Start     Dose/Rate Route Frequency Ordered Stop   09/19/19 0600  ceFAZolin  (ANCEF) 3 g in dextrose 5 % 50 mL IVPB     3 g 100 mL/hr over 30 Minutes Intravenous On call to O.R. 09/18/19 1717 09/19/19 0900   09/19/19 0000  vancomycin (VANCOREADY) IVPB 1750 mg/350 mL  Status:  Discontinued     1,750 mg 175 mL/hr over 120 Minutes Intravenous Every 12 hours 09/18/19 1019 09/18/19 1759   09/18/19 2000  ampicillin (OMNIPEN) 2 g in sodium chloride 0.9 % 100 mL IVPB     2 g 300 mL/hr over 20 Minutes Intravenous Every 4 hours 09/18/19 1801 10/09/19 2359   09/18/19 1100  vancomycin (VANCOREADY) IVPB 2000 mg/400 mL  Status:  Discontinued     2,000 mg 200 mL/hr over 120 Minutes Intravenous STAT 09/18/19 1009 09/18/19 1013   09/18/19 1100  vancomycin (VANCOCIN) 2,500 mg in sodium chloride 0.9 % 500 mL IVPB     2,500 mg 250 mL/hr over 120 Minutes Intravenous STAT 09/18/19 1013 09/18/19 1230   09/13/19 1800  Ampicillin-Sulbactam (UNASYN) 3 g in sodium chloride 0.9 % 100 mL IVPB  Status:  Discontinued     3 g 200 mL/hr over 30 Minutes Intravenous Every 6 hours 09/13/19 1621 09/15/19 1101   09/12/19 2000  vancomycin (VANCOREADY) IVPB 1500 mg/300 mL  Status:  Discontinued     1,500 mg 150 mL/hr over 120 Minutes Intravenous Every 8 hours 09/12/19 1037 09/13/19 1629   09/12/19 1045  vancomycin (VANCOREADY) IVPB 2000 mg/400 mL     2,000 mg 200 mL/hr over 120 Minutes Intravenous  Once 09/12/19 1037 09/12/19 1354  Time spent: 25 minutes-Greater than 50% of this time was spent in counseling, explanation of diagnosis, planning of further management, and coordination of care.  MEDICATIONS: Scheduled Meds: . Chlorhexidine Gluconate Cloth  6 each Topical Daily  . enoxaparin (LOVENOX) injection  40 mg Subcutaneous Q24H  . feeding supplement (PRO-STAT SUGAR FREE 64)  30 mL Oral QID  . ferrous sulfate  325 mg Oral TID WC  . folic acid  1 mg Oral Daily  . [START ON 09/24/2019] furosemide  60 mg Intravenous Daily  . insulin aspart  0-9 Units Subcutaneous TID WC  . multivitamin with  minerals  1 tablet Oral Daily  . pantoprazole  40 mg Oral Daily  . polyethylene glycol  17 g Oral Daily  . potassium chloride  40 mEq Oral Daily  . sodium chloride flush  3 mL Intravenous Once  . spironolactone  100 mg Oral Daily   Continuous Infusions: . ampicillin (OMNIPEN) IV 2 g (09/23/19 0739)  . magnesium sulfate bolus IVPB     PRN Meds:.acetaminophen **OR** [DISCONTINUED] acetaminophen, diphenhydrAMINE, HYDROmorphone (DILAUDID) injection, [DISCONTINUED] ondansetron **OR** ondansetron (ZOFRAN) IV, oxyCODONE, sodium chloride flush   PHYSICAL EXAM: Vital signs: Vitals:   09/23/19 0418 09/23/19 0501 09/23/19 0800 09/23/19 0814  BP:  119/62  114/65  Pulse:  89  86  Resp:  18  17  Temp:  98 F (36.7 C)  97.8 F (36.6 C)  TempSrc:  Oral  Oral  SpO2:  98%  97%  Weight: (!) 159.3 kg  (!) 149.1 kg   Height:       Filed Weights   09/22/19 0422 09/23/19 0418 09/23/19 0800  Weight: (!) 151.7 kg (!) 159.3 kg (!) 149.1 kg   Body mass index is 41.64 kg/m.   Gen Exam:  Awake Alert, No new F.N deficits, Normal affect [place].AT,PERRAL Supple Neck,No JVD, No cervical lymphadenopathy appriciated.  Symmetrical Chest wall movement, Good air movement bilaterally, CTAB RRR,No Gallops, Rubs or new Murmurs, No Parasternal Heave +ve B.Sounds, Abd Soft mild ascites, No tenderness, No organomegaly appriciated, No rebound - guarding or rigidity. No Cyanosis, trace edema, L . gluteal W. Vac   I have personally reviewed following labs and imaging studies  LABORATORY DATA: CBC: Recent Labs  Lab 09/19/19 2124 09/19/19 2124 09/20/19 0307 09/21/19 0835 09/21/19 2324 09/22/19 0336 09/23/19 0240  WBC 11.3*  --  10.9* 4.7  --  4.1 4.2  NEUTROABS  --   --   --   --   --  2.0 2.1  HGB 8.1*   < > 8.1* 5.8* 7.1* 7.1* 7.1*  HCT 25.8*   < > 26.0* 19.0* 22.7* 22.5* 22.8*  MCV 80.6  --  80.5 81.5  --  82.4 82.9  PLT 186  --  193 95*  --  94* 93*   < > = values in this interval not displayed.     Basic Metabolic Panel: Recent Labs  Lab 09/19/19 0412 09/19/19 0412 09/19/19 2124 09/20/19 0307 09/21/19 0835 09/22/19 0336 09/23/19 0240  NA 139   < > 135 135 135 136 137  K 3.6   < > 4.0 4.0 3.9 3.9 3.9  CL 101   < > 102 101 102 103 103  CO2 29   < > 25 25 28 28 28   GLUCOSE 123*   < > 171* 143* 150* 111* 121*  BUN 10   < > 15 17 23* 19 17  CREATININE 0.75   < > 0.94 0.97  0.82 0.84 0.88  CALCIUM 8.2*   < > 7.7* 8.0* 7.9* 7.8* 7.9*  MG 2.0  --   --   --  1.9 1.9 1.8   < > = values in this interval not displayed.    GFR: Estimated Creatinine Clearance: 152.4 mL/min (by C-G formula based on SCr of 0.88 mg/dL).  Liver Function Tests: Recent Labs  Lab 09/18/19 0509 09/19/19 0412 09/19/19 2124 09/20/19 0307 09/23/19 0240  AST 42* 40 47* 53* 62*  ALT 15 12 15 15 18   ALKPHOS 89 84 81 86 87  BILITOT 0.6 0.8 1.3* 1.1 1.1  PROT 5.5* 5.3* 5.1* 5.4* 5.3*  ALBUMIN 2.1* 2.1* 2.0* 2.1* 2.0*   No results for input(s): LIPASE, AMYLASE in the last 168 hours. No results for input(s): AMMONIA in the last 168 hours.  Coagulation Profile: No results for input(s): INR, PROTIME in the last 168 hours.  Cardiac Enzymes: No results for input(s): CKTOTAL, CKMB, CKMBINDEX, TROPONINI in the last 168 hours.  BNP (last 3 results) No results for input(s): PROBNP in the last 8760 hours.  Lipid Profile: No results for input(s): CHOL, HDL, LDLCALC, TRIG, CHOLHDL, LDLDIRECT in the last 72 hours.  Thyroid Function Tests: No results for input(s): TSH, T4TOTAL, FREET4, T3FREE, THYROIDAB in the last 72 hours.  Anemia Panel: No results for input(s): VITAMINB12, FOLATE, FERRITIN, TIBC, IRON, RETICCTPCT in the last 72 hours.  Urine analysis:    Component Value Date/Time   COLORURINE AMBER (A) 09/12/2019 1057   APPEARANCEUR HAZY (A) 09/12/2019 1057   LABSPEC 1.030 09/12/2019 1057   PHURINE 5.0 09/12/2019 1057   GLUCOSEU NEGATIVE 09/12/2019 1057   HGBUR NEGATIVE 09/12/2019 1057    BILIRUBINUR NEGATIVE 09/12/2019 1057   KETONESUR NEGATIVE 09/12/2019 1057   PROTEINUR 30 (A) 09/12/2019 1057   NITRITE NEGATIVE 09/12/2019 1057   LEUKOCYTESUR NEGATIVE 09/12/2019 1057    Sepsis Labs: Lactic Acid, Venous    Component Value Date/Time   LATICACIDVEN 2.6 (HH) 06/10/2019 1345    MICROBIOLOGY: Recent Results (from the past 240 hour(s))  Acid Fast Smear (AFB)     Status: None   Collection Time: 09/13/19 12:31 PM   Specimen: Soft Tissue Abscess  Result Value Ref Range Status   AFB Specimen Processing Concentration  Final   Acid Fast Smear Negative  Final    Comment: (NOTE) Performed At: [facility] [address] [place], [place] [id number] Rush Farmer MD VO:[id number]    Source (AFB) ABSCESS  Final    Comment: GLUTEAL Performed at [facility], [place] [address], [place], [place] [address]   Culture, body fluid-bottle     Status: None   Collection Time: 09/13/19 12:31 PM   Specimen: Abscess  Result Value Ref Range Status   Specimen Description ABSCESS  Final   Special Requests GLUTEAL  Final   Gram Stain   Final    ANAEROBIC BOTTLE ONLY GRAM POSITIVE COCCI CRITICAL RESULT CALLED TO, READ BACK BY AND VERIFIED WITH: A NEVIUS RN 09/18/19 0101 JDW Performed at [facility], [place] [address], [place], [place] [address]    Culture MODERATE ENTEROCOCCUS FAECALIS  Final   Report Status 09/19/2019 FINAL  Final   Organism ID, Bacteria ENTEROCOCCUS FAECALIS  Final      Susceptibility   Enterococcus faecalis - MIC*    AMPICILLIN <=2 SENSITIVE Sensitive     VANCOMYCIN 1 SENSITIVE Sensitive     GENTAMICIN SYNERGY SENSITIVE Sensitive     * MODERATE ENTEROCOCCUS FAECALIS  Gram stain     Status: None   Collection Time: 09/13/19 12:31 PM   Specimen: Abscess  Result Value Ref Range Status   Specimen Description ABSCESS  Final   Special Requests GLUTEAL  Final   Gram Stain   Final    CYTOSPIN SMEAR WBC PRESENT, PREDOMINANTLY PMN NO ORGANISMS  SEEN Performed at [facility], [address] [place], [place] [address]    Report Status 09/13/2019 FINAL  Final  Fungus culture, blood     Status: None   Collection Time: 09/13/19 12:31 PM   Specimen: Abscess  Result Value Ref Range Status   Specimen Description ABSCESS  Final   Special Requests GLUTEAL  Final   Culture   Final    NO GROWTH 7 DAYS NO FUNGUS ISOLATED Performed at [facility], [address] [place], [place] [address]    Report Status 09/20/2019 FINAL  Final  Culture, body fluid-bottle     Status: None   Collection Time: 09/13/19 12:31 PM   Specimen: Ascitic  Result Value Ref Range Status   Specimen Description ASCITIC  Final   Special Requests NONE  Final   Culture   Final    NO GROWTH 5 DAYS Performed at [facility] [address], [place], [place] [address]    Report Status 09/18/2019 FINAL  Final  Gram stain     Status: None   Collection Time: 09/13/19 12:31 PM   Specimen: Ascitic  Result Value Ref Range Status   Specimen Description ASCITIC  Final   Special Requests NONE  Final   Gram Stain   Final    CYTOSPIN SMEAR WBC PRESENT, PREDOMINANTLY MONONUCLEAR NO ORGANISMS SEEN Performed at [facility], [place] [address] [place], [place] [address]    Report Status 09/13/2019 FINAL  Final  Surgical pcr screen     Status: None   Collection Time: 09/18/19  5:26 PM   Specimen: Nasal Mucosa; Nasal Swab  Result Value Ref Range Status   MRSA, PCR NEGATIVE NEGATIVE Final   Staphylococcus aureus NEGATIVE NEGATIVE Final    Comment: (NOTE) The Xpert SA Assay (FDA approved for NASAL specimens in patients 48 years of age and older), is one component of a comprehensive surveillance program. It is not intended to diagnose infection nor to guide or monitor treatment. Performed at [facility], [place] [address] [place], [place] [address]   Aerobic/Anaerobic Culture (surgical/deep wound)     Status: None (Preliminary result)    Collection Time: 09/19/19  8:55 AM   Specimen: PATH Cytology Misc. fluid; Body Fluid  Result Value Ref Range Status   Specimen Description TISSUE LEFT HIP  Final   Special Requests A  Final   Gram Stain   Final    ABUNDANT WBC PRESENT, PREDOMINANTLY PMN NO ORGANISMS SEEN    Culture   Final    NO GROWTH 4 DAYS NO ANAEROBES ISOLATED; CULTURE IN PROGRESS FOR 5 DAYS Performed at [facility], [address] [place],  [address]    Report Status PENDING  Incomplete  Aerobic/Anaerobic Culture (surgical/deep wound)     Status: None (Preliminary result)   Collection Time: 09/19/19 10:31 AM   Specimen: Soft Tissue, Other  Result Value Ref Range Status   Specimen Description FLUID LEFT HIP  Final   Special Requests NONE  Final   Gram Stain   Final    RARE WBC PRESENT, PREDOMINANTLY PMN NO ORGANISMS SEEN  Culture   Final    NO GROWTH 4 DAYS NO ANAEROBES ISOLATED; CULTURE IN PROGRESS FOR 5 DAYS Performed at [facility], [place] [address], [place], [place] [address]    Report Status PENDING  Incomplete    RADIOLOGY STUDIES/RESULTS: No results found.   LOS: 11 days   Signature  Lala Lund M.D on 09/23/2019 at 11:03 AM   -  To page go to [email or web address]

## 2019-09-24 ENCOUNTER — Telehealth: Payer: Self-pay | Admitting: Orthopedic Surgery

## 2019-09-24 ENCOUNTER — Telehealth: Payer: Self-pay | Admitting: *Deleted

## 2019-09-24 LAB — AEROBIC/ANAEROBIC CULTURE W GRAM STAIN (SURGICAL/DEEP WOUND)
Culture: NO GROWTH
Culture: NO GROWTH

## 2019-09-24 LAB — COMPREHENSIVE METABOLIC PANEL
ALT: 16 U/L (ref 0–44)
AST: 52 U/L — ABNORMAL HIGH (ref 15–41)
Albumin: 1.9 g/dL — ABNORMAL LOW (ref 3.5–5.0)
Alkaline Phosphatase: 87 U/L (ref 38–126)
Anion gap: 9 (ref 5–15)
BUN: 15 mg/dL (ref 6–20)
CO2: 26 mmol/L (ref 22–32)
Calcium: 7.9 mg/dL — ABNORMAL LOW (ref 8.9–10.3)
Chloride: 102 mmol/L (ref 98–111)
Creatinine, Ser: 0.83 mg/dL (ref 0.61–1.24)
GFR calc Af Amer: >60 mL/min (ref >60)
GFR calc non Af Amer: >60 mL/min (ref >60)
Glucose, Bld: 102 mg/dL — ABNORMAL HIGH (ref 70–99)
Potassium: 4.1 mmol/L (ref 3.5–5.1)
Sodium: 137 mmol/L (ref 135–145)
Total Bilirubin: 1 mg/dL (ref 0.3–1.2)
Total Protein: 5.2 g/dL — ABNORMAL LOW (ref 6.5–8.1)

## 2019-09-24 LAB — GLUCOSE, CAPILLARY
Glucose-Capillary: 99 mg/dL (ref 70–99)
Glucose-Capillary: 133 mg/dL — ABNORMAL HIGH (ref 70–99)
Glucose-Capillary: 140 mg/dL — ABNORMAL HIGH (ref 70–99)
Glucose-Capillary: 129 mg/dL — ABNORMAL HIGH (ref 70–99)

## 2019-09-24 LAB — CBC WITH DIFFERENTIAL/PLATELET
Abs Immature Granulocytes: 0.02 10*3/uL (ref 0.00–0.07)
Basophils Absolute: 0 10*3/uL (ref 0.0–0.1)
Basophils Relative: 1 %
Eosinophils Absolute: 0.2 10*3/uL (ref 0.0–0.5)
Eosinophils Relative: 6 %
HCT: 23.1 % — ABNORMAL LOW (ref 39.0–52.0)
Hemoglobin: 7.3 g/dL — ABNORMAL LOW (ref 13.0–17.0)
Immature Granulocytes: 1 %
Lymphocytes Relative: 30 %
Lymphs Abs: 1.2 10*3/uL (ref 0.7–4.0)
MCH: 25.9 pg — ABNORMAL LOW (ref 26.0–34.0)
MCHC: 31.6 g/dL (ref 30.0–36.0)
MCV: 81.9 fL (ref 80.0–100.0)
Monocytes Absolute: 0.5 10*3/uL (ref 0.1–1.0)
Monocytes Relative: 13 %
Neutro Abs: 2 10*3/uL (ref 1.7–7.7)
Neutrophils Relative %: 49 %
Platelets: 94 10*3/uL — ABNORMAL LOW (ref 150–400)
RBC: 2.82 MIL/uL — ABNORMAL LOW (ref 4.22–5.81)
RDW: 17 % — ABNORMAL HIGH (ref 11.5–15.5)
WBC: 4 10*3/uL (ref 4.0–10.5)
nRBC: 0 % (ref 0.0–0.2)

## 2019-09-24 LAB — MAGNESIUM: Magnesium: 1.9 mg/dL (ref 1.7–2.4)

## 2019-09-24 LAB — C-REACTIVE PROTEIN: CRP: 1.8 mg/dL — ABNORMAL HIGH (ref <1.0)

## 2019-09-24 MED ORDER — AMOXICILLIN-POT CLAVULANATE 875-125 MG PO TABS: 1.0000 | ORAL_TABLET | Freq: Two times a day (BID) | ORAL | 0 refills | Status: DC

## 2019-09-24 MED ORDER — FERROUS SULFATE 325 (65 FE) MG PO TABS: 325.0000 mg | ORAL_TABLET | Freq: Every day | ORAL | 0 refills | Status: DC

## 2019-09-24 MED ORDER — OXYCODONE HCL 10 MG PO TABS: 10.0000 mg | ORAL_TABLET | Freq: Four times a day (QID) | ORAL | 0 refills | Status: AC | PRN

## 2019-09-24 MED ORDER — FUROSEMIDE 40 MG PO TABS: 40.0000 mg | ORAL_TABLET | Freq: Every day | ORAL | 0 refills | Status: AC

## 2019-09-24 MED ORDER — POTASSIUM CHLORIDE CRYS ER 20 MEQ PO TBCR: 40.0000 meq | EXTENDED_RELEASE_TABLET | Freq: Every day | ORAL | 0 refills | Status: DC

## 2019-09-24 MED ORDER — AMPICILLIN IV (FOR PTA / DISCHARGE USE ONLY): 12.0000 g | INTRAVENOUS | 0 refills | Status: DC

## 2019-09-24 MED ORDER — SPIRONOLACTONE 100 MG PO TABS: 100.0000 mg | ORAL_TABLET | Freq: Every day | ORAL | 0 refills | Status: AC

## 2019-09-24 NOTE — Telephone Encounter (Signed)
Please see message below

## 2019-09-24 NOTE — Progress Notes (Signed)
PT Progress Note for Charges    09/24/19 1500  PT Visit Information  Last PT Received On 09/24/19  PT General Charges  $$ ACUTE PT VISIT 1 Visit  PT Treatments  $Gait Training 8-22 mins  Anastasio Champion, DPT  Acute Rehabilitation Services Pager [phone] Office [phone]

## 2019-09-24 NOTE — Discharge Summary (Deleted)
. Physician Discharge Summary  Robert Sullivan DEY:[id number] DOB: 11-Jul-1965 DOA: 09/11/2019  PCP: Cathleen Corti, PA-C  Admit date: 09/11/2019 Discharge date: 09/24/2019  Admitted From: Home Disposition:  Discharged to home.   Recommendations for Outpatient Follow-up:  1. Follow up with PCP in 1 weeks 2. Please obtain BMP/CBC in one week 3. Follow up with orthopedics as scheduled.   Discharge Condition: Stable  CODE STATUS: FULL   Brief/Interim Summary: Patient is a 54 y.o. male with history of Karlene Lineman cirrhosis-recent history of MSSA bacteremia-recurrent left gluteal abscess-s/p numerous readmissions/debridements-referred from GI office for evaluation of anasarca in the setting of decompensated liver cirrhosis.  Was also found to have recurrence of his left gluteal abscess on CT imaging.  Treated with diuretics with significant improvement in his volume status, subsequently evaluated by Dr. Linnell Fulling underwent irrigation and debridement on 3/12.  See below for further details.   09/24/19: Patient refused to be sent home on IV abx regimen as previously arranged. Wants to take oral medications. Spoke with ID and Orthopedics. He has been informed that this is not optimal therapy and he could face having to come back for further debridement. He will be sent home on augmentin 829m BID for 4 week (updated recommendation from ID). He will continue lasix 40 mg and spironolactone 100 mg at home. He needs to follow up with PCP in 1 week and orthopedics as scheduled.   Discharge Diagnoses:  Active Problems:   Obesity, Class III, BMI 40-49.9 (morbid obesity) (HCC)   Anasarca   Cirrhosis of liver with ascites (HCC)   Abscess, gluteal, left   Pleural effusion   Thrombocytopenia (HCC)   Ascites  Recurrent left gluteal abscess     - S/p ultrasound-guided aspiration on 3/6+ for Enterococcus, evaluated by Dr. DSharol Givenand underwent repeat irrigation and debridement on 3/12.       - Intraoperative left  gluteal/hip cultures done on 3/12 remain negative till 09/22/2019.       - ID following; with recommendations for 3 weeks of continuous ampicillin infusion-PICC line placed 09/19/2019 stop date for IV infusion will be for 10/09/2019.       - Orthopedics recommending inpatient monitoring until perioperative wound is much improved.  As discussed with orthopedic surgeon Dr. DSharol Givenon 09/23/2019, he will remove the wound VAC today and then monitor him for any signs of infection over the next 1-2 days.     - 09/24/19: ok for discharge today per ortho; however, he is refusing current IV abx regimen. He wants to continue oral abx. He has been notified that this is not optimal therapy and that he faces having to return for further surgeries. He again refuses IV regimen as constituted. Spoke with ID. Have given updated regimen for augmentin 875 mg BID for 4 weeks.   Anasarca/ascites/bilateral pleural effusion     - Secondary to decompensated liver cirrhosis  UA without evidence of proteinuria, echo with preserved EF.      - Underwent large-volume paracentesis (9 L) on 3/6.      - Volume status continues to improve-I/O's not charted accurately; but weight decreased to 326 pounds (370 pounds on admission).      - Lower extremity Dopplers negative. Had a brief episode of hypotension yesterday which is suspected from post anesthesia/narcotics along with Lasix Aldactone.     - will go home on lasix 464mqday and aldactone 10051mPortal hypertension seen on CT imaging     - Will need EGD at  some point to confirm portal hypertensive gastropathy/survey for varices.     - Can be done in the outpatient setting.  Normocytic Anemia      - Underlying anemia of chronic disease worse due to perioperative blood loss, 2 units of packed RBCs ordered on 09/21/2019       - no signs of ongoing GI bleed.     - 09/24/19: Hgb is stable/improving today     - continue iron at discharge     - follow up CBC w/ PCP  Atypical chest pain      - Resolved-troponins negative.  Thrombocytopenia     - Mild-secondary to hypersplenism in the setting of liver cirrhosis.  Discharge Instructions  Discharge Instructions    Home infusion instructions   Complete by: As directed    Instructions: Flushing of vascular access device: 0.9% NaCl pre/post medication administration and prn patency; Heparin 100 u/ml, 54m for implanted ports and Heparin 10u/ml, 59mfor all other central venous catheters.     Allergies as of 09/24/2019      Reactions   Sulfa Antibiotics Rash   Total body rash      Medication List    TAKE these medications   amoxicillin-clavulanate 875-125 MG tablet Commonly known as: Augmentin Take 1 tablet by mouth 2 (two) times daily for 28 days.   aspirin 325 MG tablet Take 1 tablet (325 mg total) by mouth daily. What changed:   when to take this  reasons to take this   blood glucose meter kit and supplies Kit Dispense based on patient and insurance preference. Use up to four times daily as directed. (FOR ICD-9 250.00, 250.01).   Ensure Max Protein Liqd Take 330 mLs (11 oz total) by mouth daily.   ferrous sulfate 325 (65 FE) MG tablet Take 1 tablet (325 mg total) by mouth daily with breakfast.   furosemide 40 MG tablet Commonly known as: Lasix Take 1 tablet (40 mg total) by mouth daily.   insulin glargine 100 UNIT/ML injection Commonly known as: LANTUS Inject 0.24 mLs (24 Units total) into the skin daily. What changed: how much to take   Insulin Syringes (Disposable) U-100 0.5 ML Misc Use as directed 3 times daily AC.   multivitamin with minerals Tabs tablet Take 1 tablet by mouth daily.   NovoLOG 100 UNIT/ML injection Generic drug: insulin aspart Inject 4 Units into the skin in the morning and at bedtime.   Oxycodone HCl 10 MG Tabs Take 1 tablet (10 mg total) by mouth every 6 (six) hours as needed for up to 5 days for severe pain.   potassium chloride SA 20 MEQ tablet Commonly known as:  KLOR-CON Take 2 tablets (40 mEq total) by mouth daily. Start taking on: September 25, 2019   spironolactone 100 MG tablet Commonly known as: ALDACTONE Take 1 tablet (100 mg total) by mouth daily. Start taking on: September 25, 2019            Home Infusion Instuctions  (From admission, onward)         Start     Ordered   09/24/19 0000  Home infusion instructions    Question:  Instructions  Answer:  Flushing of vascular access device: 0.9% NaCl pre/post medication administration and prn patency; Heparin 100 u/ml, 76m39mor implanted ports and Heparin 10u/ml, 76ml23mr all other central venous catheters.   09/24/19 1104         Follow-up Information    [facility]  Follow up on 10/01/2019.   Specialty: Gastroenterology Why: 9 AM follow up cirrhosis and ascites Contact information: [address] [place] [address] [phone]        Mansouraty, Telford Nab., MD Follow up on 11/06/2019.   Specialties: Gastroenterology, Internal Medicine Why: 1:50 PM follow up for cirrhosis and ascites with GI MD. Contact information: [place] [address] [phone]        Newt Minion, MD In 1 week.   Specialty: Orthopedic Surgery Contact information: [place] [address] [phone]        Carlyle Basques, MD. Schedule an appointment as soon as possible for a visit in 2 week(s).   Specialty: Infectious Diseases Contact information: [address] [place] [address] [phone]          Allergies  Allergen Reactions  . Sulfa Antibiotics Rash    Total body rash    Consultations:  Orthopedics   Procedures/Studies: DG Chest 2 View  Result Date: 09/11/2019 CLINICAL DATA:  54 year old male with chest pain and shortness of breath. EXAM: CHEST - 2 VIEW COMPARISON:  Chest radiograph dated 06/19/2019. FINDINGS: There is shallow inspiration with bibasilar atelectasis. No focal consolidation, pleural  effusion, or pneumothorax. Stable cardiac silhouette. No acute osseous pathology. IMPRESSION: No acute cardiopulmonary process. Electronically Signed   By: Anner Crete M.D.   On: 09/11/2019 17:57   MR HIP LEFT W WO CONTRAST  Result Date: 09/13/2019 CLINICAL DATA:  Severe left hip pain. Follow-up gluteal abscess. EXAM: MRI OF THE LEFT HIP WITHOUT AND WITH CONTRAST TECHNIQUE: Multiplanar, multisequence MR imaging was performed both before and after administration of intravenous contrast. CONTRAST:  78m GADAVIST GADOBUTROL 1 MMOL/ML IV SOLN COMPARISON:  CT scan 09/12/2019 and prior MRI 07/10/2019 and 06/13/2019 FINDINGS: Examination is quite limited due to body habitus and patient motion. As demonstrated on the CT scan there is a large thick walled abscess in the left gluteal area in the gluteus maximus muscle laterally. This measures approximately 19 x 5.5 cm and contains gas and demonstrates moderate rim enhancement. There is also surrounding inflammatory changes with significant myofasciitis involving the left gluteal muscles and to a lesser extent the left hip musculature. No definite findings for other abscesses. No findings suspicious for septic arthritis involving the left hip or osteomyelitis involving the left femur or left hemipelvis. Mild myositis involving the right hip and pelvic muscles. The pubic symphysis and SI joints are intact. No findings for septic arthritis. Diffuse subcutaneous soft tissue swelling/edema/fluid consistent with cellulitis. Mildly enlarged inguinal lymph nodes are likely inflammatory/hyperplastic. Large volume pelvic ascites related to patient's known cirrhosis. IMPRESSION: 1. 19 x 5.5 cm thick walled abscess in the left gluteus maximus muscle laterally. 2. Significant surrounding myofasciitis. 3. No findings for septic arthritis involving the left hip or left hemipelvis. 4. Large volume pelvic ascites related to patient's known cirrhosis. 5. Mild myositis involving the  right hip and pelvic muscles. Electronically Signed   By: PMarijo SanesM.D.   On: 09/13/2019 10:20   CT ABDOMEN PELVIS W CONTRAST  Result Date: 09/12/2019 CLINICAL DATA:  Abdominal distension, ascites EXAM: CT ABDOMEN AND PELVIS WITH CONTRAST TECHNIQUE: Multidetector CT imaging of the abdomen and pelvis was performed using the standard protocol following bolus administration of intravenous contrast. CONTRAST:  1074mOMNIPAQUE IOHEXOL 300 MG/ML  SOLN COMPARISON:  CT 06/20/2019 FINDINGS: Lower chest: Small left and trace right pleural effusions with associated compressive atelectasis. Normal heart  size. No pericardial effusion. Hepatobiliary: Shrunken, nodular appearance of the liver suggesting cirrhosis. No focal liver lesion identified. Prior cholecystectomy. Pancreas: Appears unremarkable without pancreatic ductal dilatation. No definite peripancreatic inflammatory changes although fluid from adjacent ascites slightly limits the evaluation. Spleen: Splenomegaly. Spleen measures 20 cm in length. Adrenals/Urinary Tract: Unremarkable adrenal glands. 1.6 cm lower pole right renal cyst. Kidneys appear otherwise within normal limits. No renal calculi. No hydronephrosis. Nondilated ureters. Urinary bladder appears unremarkable. Stomach/Bowel: Stomach is within normal limits. Appendix not clearly visualized. Scattered colonic diverticulosis. No evidence of bowel wall thickening, distention, or inflammatory changes. Vascular/Lymphatic: Abdominal aorta is nonaneurysmal. Recanalization of the umbilical vein. Multiple upper abdominal varices suggesting portal hypertension. Portal vein appears patent. Small upper abdominal lymph nodes including within the porta hepatis. Reproductive: Prostate is unremarkable. Other: Moderate to large volume ascites. No pneumoperitoneum. Musculoskeletal: Rim enhancing fluid and air collection within the left gluteal soft tissues at site of previously seen gluteal abscess. Approximate  measurements of the collection are 6.8 x 4.1 cm trans-axially by 16.7 cm craniocaudally (series 3, image 106; series 10, image 156). Extensive surrounding subcutaneous edema overlying the left hip and proximal thigh as well as the bilateral flanks. No acute osseous abnormality. No areas of cortical destruction to suggest osteomyelitis. IMPRESSION: 1. Rim-enhancing fluid and air collection within the left gluteal soft tissues at site of previously seen gluteal abscess. Approximate measurements of the collection are 6.8 x 4.1 x 16.7 cm. No areas of underlying cortical destruction to suggest osteomyelitis. 2. Cirrhosis with stigmata of portal hypertension including splenomegaly and moderate to large volume ascites. 3. Small left and trace right pleural effusions. These results were called by telephone at the time of interpretation on 09/12/2019 at 10:18 am to provider [facility] , who verbally acknowledged these results. Electronically Signed   By: Davina Poke D.O.   On: 09/12/2019 10:19   IR US Guide Bx Asp/Drain  Result Date: 09/13/2019 INDICATION: New diagnosis of cirrhosis, now with symptomatic intra-abdominal ascites. Additionally, patient with history of superinfected hematoma previously requiring image guided drainage catheter placement on 06/17/2019 with multiple subsequent operative debridement. Preceding CT scan demonstrated a recurrent fluid collection about the left lateral thigh and as such request made for image guided aspiration for diagnostic purposes. EXAM: 1. ULTRASOUND-GUIDED PARACENTESIS 2. ULTRASOUND-GUIDED ASPIRATION OF INDETERMINATE RECURRENT FLUID COLLECTION WITHIN THE SUPEROLATERAL ASPECT OF THE LEFT THIGH. COMPARISON:  CT abdomen and pelvis-10/02/2019; image guided left lateral thigh percutaneous drainage catheter placement-06/17/2019 MEDICATIONS: None. COMPLICATIONS: None immediate. TECHNIQUE: Informed written consent was obtained from the patient after a discussion of the risks,  benefits and alternatives to treatment. A timeout was performed prior to the initiation of the procedure. Initial ultrasound scanning demonstrates a moderate amount of ascites within the left lower abdomen which was subsequently prepped and draped in the usual sterile fashion. 1% lidocaine with epinephrine was used for local anesthesia. An ultrasound image was saved for documentation purposed. An 8 Fr Safe-T-Centesis catheter was introduced. The paracentesis was performed ultimately yielding 9 L of serous ascitic fluid. A representative sample was capped and sent to the laboratory for analysis. The catheter was removed. _________________________________________________________ Attention was now paid towards ultrasound-guided aspiration of recurrent indeterminate fluid collection within the subcutaneous tissues about the anterior superolateral aspect the left thigh. Sonographic evaluation demonstrates a at least 15.1 x 5.9 cm minimally complex fluid collection within the subcutaneous tissues about the superolateral aspect of the left thigh, subjacent to overlying surgical incision. The skin overlying the operative site with  prepped and draped in usual sterile fashion. 1% lidocaine with epinephrine was utilized for local anesthesia. Under direct ultrasound guidance, an 18 gauge trocar needle was advanced into a cystic component of the complex fluid collection and a short Amplatz wire was coiled within the collection. Multiple image was saved procedural documentation purposes Next, the 18 gauge trocar needle was exchanged for a 15 cm Yueh sheath catheter which was utilized to aspirate approximately 350 cc of serous non foul smelling fluid as the catheter was slowly retracted. A representative sample was capped and sent to the laboratory for analysis. Dressings were applied. The patient tolerated the above procedures well without immediate postprocedural complication. FINDINGS: A total of approximately 9 liters of  serous fluid was removed. Technically successful ultrasound-guided aspiration of 350 cc of serous non foul smelling fluid from minimally complex fluid collection within the subcutaneous tissues about the superolateral aspect the left thigh, subjacent to the overlying surgical incision. Representative samples were sent separately from each collection to the laboratory for analysis. IMPRESSION: 1. Successful ultrasound-guided paracentesis yielding 9 liters of peritoneal fluid. 2. Successful ultrasound-guided aspiration of 350 cc of serous, non foul smelling fluid from minimally complex recurrent fluid collection with the subcutaneous tissues about the superolateral aspect of the left thigh, subjacent to the overlying surgical incision. 3. Representative samples were sent separately from each collection to the laboratory for analysis. Electronically Signed   By: Sandi Mariscal M.D.   On: 09/13/2019 14:24   ECHOCARDIOGRAM COMPLETE  Result Date: 09/12/2019    ECHOCARDIOGRAM REPORT   Patient Name:   Robert Sullivan Date of Exam: 09/12/2019 Medical Rec #:  [id number]   Height:       75.0 in Accession #:    [id number]  Weight:       370.0 lb Date of Birth:  [birth year]  BSA:          2.850 m Patient Age:    47 years    BP:           144/82 mmHg Patient Gender: M           HR:           95 bpm. Exam Location:  Inpatient Procedure: 2D Echo Indications:    acute diastolic chf 223.36  History:        Patient has prior history of Echocardiogram examinations, most                 recent 06/12/2019. Cirrhosis of liver with ascites. pleural                 effusion.  Sonographer:    Johny Chess Referring Phys: [phone] DAVID TAT  Sonographer Comments: Patient is morbidly obese. Image acquisition challenging due to respiratory motion and Image acquisition challenging due to patient body habitus. IMPRESSIONS  1. Normal LV function; trace MR; ascites noted on subcostal views.  2. Left ventricular ejection fraction, by estimation, is 60 to  65%. The left ventricle has normal function. The left ventricle has no regional wall motion abnormalities. Left ventricular diastolic parameters were normal.  3. Right ventricular systolic function is normal. The right ventricular size is normal.  4. The mitral valve is normal in structure and function. No evidence of mitral valve regurgitation. No evidence of mitral stenosis.  5. The aortic valve is tricuspid. Aortic valve regurgitation is not visualized. No aortic stenosis is present.  6. The inferior vena cava is normal in size with greater than 50% respiratory variability,  suggesting right atrial pressure of 3 mmHg. FINDINGS  Left Ventricle: Left ventricular ejection fraction, by estimation, is 60 to 65%. The left ventricle has normal function. The left ventricle has no regional wall motion abnormalities. The left ventricular internal cavity size was normal in size. There is  no left ventricular hypertrophy. Left ventricular diastolic parameters were normal. Right Ventricle: The right ventricular size is normal. Right ventricular systolic function is normal. Left Atrium: Left atrial size was normal in size. Right Atrium: Right atrial size was normal in size. Pericardium: There is no evidence of pericardial effusion. Mitral Valve: The mitral valve is normal in structure and function. Normal mobility of the mitral valve leaflets. No evidence of mitral valve regurgitation. No evidence of mitral valve stenosis. Tricuspid Valve: The tricuspid valve is normal in structure. Tricuspid valve regurgitation is trivial. No evidence of tricuspid stenosis. Aortic Valve: The aortic valve is tricuspid. Aortic valve regurgitation is not visualized. No aortic stenosis is present. Pulmonic Valve: The pulmonic valve was normal in structure. Pulmonic valve regurgitation is trivial. No evidence of pulmonic stenosis. Aorta: The aortic root is normal in size and structure. Venous: The inferior vena cava is normal in size with greater  than 50% respiratory variability, suggesting right atrial pressure of 3 mmHg. IAS/Shunts: No atrial level shunt detected by color flow Doppler. Additional Comments: Normal LV function; trace MR; ascites noted on subcostal views.  LEFT VENTRICLE PLAX 2D LVIDd:         4.69 cm  Diastology LVIDs:         2.91 cm  LV e' lateral: 16.50 cm/s LV PW:         0.96 cm  LV e' medial:  10.20 cm/s LV IVS:        0.95 cm LVOT diam:     2.30 cm LV SV:         88 LV SV Index:   31 LVOT Area:     4.15 cm  LEFT ATRIUM           Index       RIGHT ATRIUM           Index LA diam:      4.60 cm 1.61 cm/m  RA Area:     12.80 cm LA Vol (A2C): 55.7 ml 19.54 ml/m RA Volume:   24.00 ml  8.42 ml/m  AORTIC VALVE LVOT Vmax:   110.00 cm/s LVOT Vmean:  69.800 cm/s LVOT VTI:    0.211 m  AORTA Ao Root diam: 3.30 cm  SHUNTS Systemic VTI:  0.21 m Systemic Diam: 2.30 cm Kirk Ruths MD Electronically signed by Kirk Ruths MD Signature Date/Time: 09/12/2019/4:38:39 PM    Final    VAS [place] LOWER EXTREMITY VENOUS (DVT)  Result Date: 09/14/2019  Lower Venous DVTStudy Indications: Edema. Other Indications: Anasarca. Risk Factors: Decompensated liver cirrhosis with anasarca. Limitations: Body habitus and interstitial edema. Comparison Study: No prior study on file Performing Technologist: Sharion Dove RVS  Examination Guidelines: A complete evaluation includes B-mode imaging, spectral Doppler, color Doppler, and power Doppler as needed of all accessible portions of each vessel. Bilateral testing is considered an integral part of a complete examination. Limited examinations for reoccurring indications may be performed as noted. The reflux portion of the exam is performed with the patient in reverse Trendelenburg.  +---------+---------------+---------+-----------+----------+-------------------+ RIGHT    CompressibilityPhasicitySpontaneityPropertiesThrombus Aging       +---------+---------------+---------+-----------+----------+-------------------+ CFV      Full  Yes      Yes                                      +---------+---------------+---------+-----------+----------+-------------------+ SFJ      Full                                                             +---------+---------------+---------+-----------+----------+-------------------+ FV Prox  Full           Yes      Yes                                      +---------+---------------+---------+-----------+----------+-------------------+ FV Mid                                                Not visualized      +---------+---------------+---------+-----------+----------+-------------------+ FV Distal                                             Not visualized      +---------+---------------+---------+-----------+----------+-------------------+ PFV      Full                                                             +---------+---------------+---------+-----------+----------+-------------------+ POP                     Yes      Yes                  patent by color and                                                       Doppler             +---------+---------------+---------+-----------+----------+-------------------+ PTV                                                   patent by color     +---------+---------------+---------+-----------+----------+-------------------+ PERO                                                  patent by color     +---------+---------------+---------+-----------+----------+-------------------+   +---------+---------------+---------+-----------+----------+-------------------+ LEFT     CompressibilityPhasicitySpontaneityPropertiesThrombus Aging      +---------+---------------+---------+-----------+----------+-------------------+  CFV      Full           Yes      Yes                                       +---------+---------------+---------+-----------+----------+-------------------+ SFJ      Full                                                             +---------+---------------+---------+-----------+----------+-------------------+ FV Prox                 Yes      Yes                  patent by color and                                                       Doppler             +---------+---------------+---------+-----------+----------+-------------------+ FV Mid                  Yes      Yes                  patent by color and                                                       Doppler             +---------+---------------+---------+-----------+----------+-------------------+ FV Distal               Yes      Yes                  patent by color and                                                       Doppler             +---------+---------------+---------+-----------+----------+-------------------+ PFV                                                   Not visualized      +---------+---------------+---------+-----------+----------+-------------------+ POP      Full           Yes      Yes                                      +---------+---------------+---------+-----------+----------+-------------------+ PTV  patent by color     +---------+---------------+---------+-----------+----------+-------------------+ PERO                                                  patent by color     +---------+---------------+---------+-----------+----------+-------------------+     Summary: RIGHT: - There is no evidence of deep vein thrombosis in the lower extremity. However, portions of this examination were limited- see technologist comments above.  LEFT: - There is no evidence of deep vein thrombosis in the lower extremity. However, portions of this examination were limited- see technologist  comments above.  *See table(s) above for measurements and observations. Electronically signed by Monica Martinez MD on 09/14/2019 at 11:02:56 AM.    Final    [place] EKG SITE RITE  Result Date: 09/19/2019 If Site Rite image not attached, placement could not be confirmed due to current cardiac rhythm.  IR Paracentesis  Result Date: 09/13/2019 INDICATION: New diagnosis of cirrhosis, now with symptomatic intra-abdominal ascites. Additionally, patient with history of superinfected hematoma previously requiring image guided drainage catheter placement on 06/17/2019 with multiple subsequent operative debridement. Preceding CT scan demonstrated a recurrent fluid collection about the left lateral thigh and as such request made for image guided aspiration for diagnostic purposes. EXAM: 1. ULTRASOUND-GUIDED PARACENTESIS 2. ULTRASOUND-GUIDED ASPIRATION OF INDETERMINATE RECURRENT FLUID COLLECTION WITHIN THE SUPEROLATERAL ASPECT OF THE LEFT THIGH. COMPARISON:  CT abdomen and pelvis-10/02/2019; image guided left lateral thigh percutaneous drainage catheter placement-06/17/2019 MEDICATIONS: None. COMPLICATIONS: None immediate. TECHNIQUE: Informed written consent was obtained from the patient after a discussion of the risks, benefits and alternatives to treatment. A timeout was performed prior to the initiation of the procedure. Initial ultrasound scanning demonstrates a moderate amount of ascites within the left lower abdomen which was subsequently prepped and draped in the usual sterile fashion. 1% lidocaine with epinephrine was used for local anesthesia. An ultrasound image was saved for documentation purposed. An 8 Fr Safe-T-Centesis catheter was introduced. The paracentesis was performed ultimately yielding 9 L of serous ascitic fluid. A representative sample was capped and sent to the laboratory for analysis. The catheter was removed. _________________________________________________________ Attention was now paid towards  ultrasound-guided aspiration of recurrent indeterminate fluid collection within the subcutaneous tissues about the anterior superolateral aspect the left thigh. Sonographic evaluation demonstrates a at least 15.1 x 5.9 cm minimally complex fluid collection within the subcutaneous tissues about the superolateral aspect of the left thigh, subjacent to overlying surgical incision. The skin overlying the operative site with prepped and draped in usual sterile fashion. 1% lidocaine with epinephrine was utilized for local anesthesia. Under direct ultrasound guidance, an 18 gauge trocar needle was advanced into a cystic component of the complex fluid collection and a short Amplatz wire was coiled within the collection. Multiple image was saved procedural documentation purposes Next, the 18 gauge trocar needle was exchanged for a 15 cm Yueh sheath catheter which was utilized to aspirate approximately 350 cc of serous non foul smelling fluid as the catheter was slowly retracted. A representative sample was capped and sent to the laboratory for analysis. Dressings were applied. The patient tolerated the above procedures well without immediate postprocedural complication. FINDINGS: A total of approximately 9 liters of serous fluid was removed. Technically successful ultrasound-guided aspiration of 350 cc of serous non foul smelling fluid from minimally complex fluid collection within the subcutaneous tissues about the superolateral aspect  the left thigh, subjacent to the overlying surgical incision. Representative samples were sent separately from each collection to the laboratory for analysis. IMPRESSION: 1. Successful ultrasound-guided paracentesis yielding 9 liters of peritoneal fluid. 2. Successful ultrasound-guided aspiration of 350 cc of serous, non foul smelling fluid from minimally complex recurrent fluid collection with the subcutaneous tissues about the superolateral aspect of the left thigh, subjacent to the  overlying surgical incision. 3. Representative samples were sent separately from each collection to the laboratory for analysis. Electronically Signed   By: Sandi Mariscal M.D.   On: 09/13/2019 14:24      Subjective: No acute events ON per nursing.  Discharge Exam: Vitals:   09/24/19 0750 09/24/19 1416  BP: 118/68 111/66  Pulse: 89 92  Resp: 18 18  Temp: 98.2 F (36.8 C) 98.1 F (36.7 C)  SpO2: 99% 96%   Vitals:   09/23/19 1938 09/24/19 0604 09/24/19 0750 09/24/19 1416  BP: 121/61 120/62 118/68 111/66  Pulse: 89 89 89 92  Resp: 18 18 18 18   Temp: 98.2 F (36.8 C) 98.1 F (36.7 C) 98.2 F (36.8 C) 98.1 F (36.7 C)  TempSrc: Oral Oral Oral Oral  SpO2: 98% 99% 99% 96%  Weight:  (!) 149.9 kg    Height:        General: 54 y.o. male resting in bed in NAD Cardiovascular: RRR, +S1, S2, no m/g/r, equal pulses throughout Respiratory: CTABL, no w/r/r, normal WOB GI: BS+, NDNT, no masses noted, no organomegaly noted Neuro: A&O x 3, no focal deficits Psyc: Appropriate interaction and affect, calm/cooperative   The results of significant diagnostics from this hospitalization (including imaging, microbiology, ancillary and laboratory) are listed below for reference.     Microbiology: Recent Results (from the past 240 hour(s))  Surgical pcr screen     Status: None   Collection Time: 09/18/19  5:26 PM   Specimen: Nasal Mucosa; Nasal Swab  Result Value Ref Range Status   MRSA, PCR NEGATIVE NEGATIVE Final   Staphylococcus aureus NEGATIVE NEGATIVE Final    Comment: (NOTE) The Xpert SA Assay (FDA approved for NASAL specimens in patients 69 years of age and older), is one component of a comprehensive surveillance program. It is not intended to diagnose infection nor to guide or monitor treatment. Performed at [facility], [place] [address], [place], [place] [address]   Aerobic/Anaerobic Culture (surgical/deep wound)     Status: None   Collection Time: 09/19/19  8:55 AM    Specimen: PATH Cytology Misc. fluid; Body Fluid  Result Value Ref Range Status   Specimen Description TISSUE LEFT HIP  Final   Special Requests A  Final   Gram Stain   Final    ABUNDANT WBC PRESENT, PREDOMINANTLY PMN NO ORGANISMS SEEN    Culture   Final    No growth aerobically or anaerobically. Performed at [facility], [place] [address] [place], [place] [address]    Report Status 09/24/2019 FINAL  Final  Aerobic/Anaerobic Culture (surgical/deep wound)     Status: None   Collection Time: 09/19/19 10:31 AM   Specimen: Soft Tissue, Other  Result Value Ref Range Status   Specimen Description FLUID LEFT HIP  Final   Special Requests NONE  Final   Gram Stain   Final    RARE WBC PRESENT, PREDOMINANTLY PMN NO ORGANISMS SEEN    Culture   Final    No growth aerobically or anaerobically. Performed at [facility], [place]  [address], [place], [place] [address]    Report Status 09/24/2019 FINAL  Final     Labs: BNP (last 3 results) Recent Labs    09/12/19 0744  BNP 62.9   Basic Metabolic Panel: Recent Labs  Lab 09/19/19 0412 09/19/19 2124 09/20/19 0307 09/21/19 0835 09/22/19 0336 09/23/19 0240 09/24/19 0445  NA 139   < > 135 135 136 137 137  K 3.6   < > 4.0 3.9 3.9 3.9 4.1  CL 101   < > 101 102 103 103 102  CO2 29   < > 25 28 28 28 26   GLUCOSE 123*   < > 143* 150* 111* 121* 102*  BUN 10   < > 17 23* 19 17 15   CREATININE 0.75   < > 0.97 0.82 0.84 0.88 0.83  CALCIUM 8.2*   < > 8.0* 7.9* 7.8* 7.9* 7.9*  MG 2.0  --   --  1.9 1.9 1.8 1.9   < > = values in this interval not displayed.   Liver Function Tests: Recent Labs  Lab 09/19/19 0412 09/19/19 2124 09/20/19 0307 09/23/19 0240 09/24/19 0445  AST 40 47* 53* 62* 52*  ALT 12 15 15 18 16   ALKPHOS 84 81 86 87 87  BILITOT 0.8 1.3* 1.1 1.1 1.0  PROT 5.3* 5.1* 5.4* 5.3* 5.2*  ALBUMIN 2.1* 2.0* 2.1* 2.0* 1.9*   No results for input(s): LIPASE, AMYLASE in the last 168 hours. No results for input(s): AMMONIA  in the last 168 hours. CBC: Recent Labs  Lab 09/20/19 0307 09/20/19 0307 09/21/19 0835 09/21/19 2324 09/22/19 0336 09/23/19 0240 09/24/19 0445  WBC 10.9*  --  4.7  --  4.1 4.2 4.0  NEUTROABS  --   --   --   --  2.0 2.1 2.0  HGB 8.1*   < > 5.8* 7.1* 7.1* 7.1* 7.3*  HCT 26.0*   < > 19.0* 22.7* 22.5* 22.8* 23.1*  MCV 80.5  --  81.5  --  82.4 82.9 81.9  PLT 193  --  95*  --  94* 93* 94*   < > = values in this interval not displayed.   Cardiac Enzymes: No results for input(s): CKTOTAL, CKMB, CKMBINDEX, TROPONINI in the last 168 hours. BNP: Invalid input(s): POCBNP CBG: Recent Labs  Lab 09/23/19 1102 09/23/19 1641 09/23/19 2020 09/24/19 0630 09/24/19 1152  GLUCAP 141* 119* 116* 99 133*   D-Dimer No results for input(s): DDIMER in the last 72 hours. Hgb A1c No results for input(s): HGBA1C in the last 72 hours. Lipid Profile No results for input(s): CHOL, HDL, LDLCALC, TRIG, CHOLHDL, LDLDIRECT in the last 72 hours. Thyroid function studies No results for input(s): TSH, T4TOTAL, T3FREE, THYROIDAB in the last 72 hours.  Invalid input(s): FREET3 Anemia work up No results for input(s): VITAMINB12, FOLATE, FERRITIN, TIBC, IRON, RETICCTPCT in the last 72 hours. Urinalysis    Component Value Date/Time   COLORURINE AMBER (A) 09/12/2019 1057   APPEARANCEUR HAZY (A) 09/12/2019 1057   LABSPEC 1.030 09/12/2019 1057   PHURINE 5.0 09/12/2019 1057   GLUCOSEU NEGATIVE 09/12/2019 1057   HGBUR NEGATIVE 09/12/2019 1057   BILIRUBINUR NEGATIVE 09/12/2019 1057   KETONESUR NEGATIVE 09/12/2019 1057   PROTEINUR 30 (A) 09/12/2019 1057   NITRITE NEGATIVE 09/12/2019 1057   LEUKOCYTESUR NEGATIVE 09/12/2019 1057   Sepsis Labs Invalid input(s): PROCALCITONIN,  WBC,  LACTICIDVEN Microbiology Recent Results (from the past 240 hour(s))  Surgical pcr screen     Status: None  Collection Time: 09/18/19  5:26 PM   Specimen: Nasal Mucosa; Nasal Swab  Result Value Ref Range Status   MRSA, PCR  NEGATIVE NEGATIVE Final   Staphylococcus aureus NEGATIVE NEGATIVE Final    Comment: (NOTE) The Xpert SA Assay (FDA approved for NASAL specimens in patients 71 years of age and older), is one component of a comprehensive surveillance program. It is not intended to diagnose infection nor to guide or monitor treatment. Performed at [facility], [place] [address] [place], [place] [address]   Aerobic/Anaerobic Culture (surgical/deep wound)     Status: None   Collection Time: 09/19/19  8:55 AM   Specimen: PATH Cytology Misc. fluid; Body Fluid  Result Value Ref Range Status   Specimen Description TISSUE LEFT HIP  Final   Special Requests A  Final   Gram Stain   Final    ABUNDANT WBC PRESENT, PREDOMINANTLY PMN NO ORGANISMS SEEN    Culture   Final    No growth aerobically or anaerobically. Performed at [facility], [place] [address], [place], [place] [address]    Report Status 09/24/2019 FINAL  Final  Aerobic/Anaerobic Culture (surgical/deep wound)     Status: None   Collection Time: 09/19/19 10:31 AM   Specimen: Soft Tissue, Other  Result Value Ref Range Status   Specimen Description FLUID LEFT HIP  Final   Special Requests NONE  Final   Gram Stain   Final    RARE WBC PRESENT, PREDOMINANTLY PMN NO ORGANISMS SEEN    Culture   Final    No growth aerobically or anaerobically. Performed at [facility], [place] [address] [place], [place] [address]    Report Status 09/24/2019 FINAL  Final     Time coordinating discharge: Over 35 minutes  SIGNED:   Jonnie Finner, DO  [facility] 09/24/2019, 4:18 PM   If 7PM-7AM, please contact night-coverage [email or web address]

## 2019-09-24 NOTE — Progress Notes (Signed)
Occupational Therapy Treatment Patient Details Name: Robert Sullivan MRN: [id number] DOB: 11/14/1965 Today's Date: 09/24/2019    History of present illness 54 year old male with recent history of fall and subsequent gluteal abscess. Pt with recent hospitalization for this, has undergone IR drainage and x6 surgical I&Ds Dec 2020-Jan 2021 with excision of necrotic gluteal. Pt readmitted 3/4 with shortness of breath, fluid collection L gluteal soft tissue (pending culture), cirrhosis with ascites s/p paracentesis and L thigh aspiration 3/6. Left hip debridement and wound VAC placement 3/12. PMHx:DM2, OA, septic shock, obesity, and arthritis.   OT comments  Pt making steady progress towards OT goals this session. Session focus on functional mobility as precursor to higher level ADLs. Pt reports frustration upon OTA arrival about length of hospital stay and is anxiously awaiting DC, however pt agreeable to OT intervention. Overall, pt requires min guard for functional mobility with no AD. Pt with drop in BP upon standing from EOB however pt asymptomatic ( see vitals in comments section). Assisted pt to recliner with close min guard where pt completed seated UB ADLs with supervision. DC plan remains appropriate, will follow acutely per POC.    Follow Up Recommendations  No OT follow up;Supervision - Intermittent    Equipment Recommendations  None recommended by OT    Recommendations for Other Services      Precautions / Restrictions Precautions Precautions: Other (comment) Precaution Comments: wound vac removed 3/16;monitor HR and BP       Mobility Bed Mobility Overal bed mobility: Modified Independent Bed Mobility: Supine to Sit     Supine to sit: Modified independent (Device/Increase time)     General bed mobility comments: HOB elevated, use of bedrails.   Transfers Overall transfer level: Needs assistance Equipment used: None Transfers: Sit to/from [facility] Sit  to Stand: Min guard Stand pivot transfers: Min guard       General transfer comment: minguard for safety    Balance Overall balance assessment: No apparent balance deficits (not formally assessed)                                         ADL either performed or assessed with clinical judgement   ADL Overall ADL's : Needs assistance/impaired     Grooming: Oral care;Sitting;Supervision/safety                 Lower Body Dressing Details (indicate cue type and reason): pt reports family assisted with LB ADLs at baseline Toilet Transfer: Min Psychiatric nurse Details (indicate cue type and reason): simulated toilet transfer to recliner with no AD and min guard for safety         Functional mobility during ADLs: Min guard General ADL Comments: session focus on functional mobility as precursor to higher level ADLs however pt with drop in BP upon sit<>stand however pt asymptomatic.     Vision       Perception     Praxis      Cognition Arousal/Alertness: Awake/alert Behavior During Therapy: WFL for tasks assessed/performed Overall Cognitive Status: Within Functional Limits for tasks assessed                                 General Comments: Pt frustrated with medical status and length of hospital stay.         Exercises  Shoulder Instructions       General Comments supine BP 111/61 HR 94, BP in standing 80/67 HR 137, seated in recliner 111/74 HR 107. Pt asymtomatic during session    Pertinent Vitals/ Pain       Pain Assessment: Faces Faces Pain Scale: No hurt  Home Living                                          Prior Functioning/Environment              Frequency  Min 2X/week        Progress Toward Goals  OT Goals(current goals can now be found in the care plan section)  Progress towards OT goals: Progressing toward goals  Acute Rehab OT Goals Patient Stated Goal: to get some  sleep OT Goal Formulation: With patient Time For Goal Achievement: 10/05/19 Potential to Achieve Goals: Good  Plan Discharge plan remains appropriate    Co-evaluation                 AM-PAC OT "6 Clicks" Daily Activity     Outcome Measure   Help from another person eating meals?: None Help from another person taking care of personal grooming?: None Help from another person toileting, which includes using toliet, bedpan, or urinal?: A Little Help from another person bathing (including washing, rinsing, drying)?: A Little Help from another person to put on and taking off regular upper body clothing?: None Help from another person to put on and taking off regular lower body clothing?: A Little 6 Click Score: 21    End of Session    OT Visit Diagnosis: Unsteadiness on feet (R26.81);Repeated falls (R29.6)   Activity Tolerance Patient tolerated treatment well   Patient Left in chair;with call bell/phone within reach   Nurse Communication          Time: 2025-4270 OT Time Calculation (min): 20 min  Charges: OT General Charges $OT Visit: 1 Visit OT Treatments $Therapeutic Activity: 8-22 mins  Lanier Clam., COTA/L Acute Rehabilitation Services [phone]   Ihor Gully 09/24/2019, 2:49 PM

## 2019-09-24 NOTE — Telephone Encounter (Signed)
Pt called just had surgery and is requesting a call back from Dr. Sharol Given or assistant in regarding antibiotic the hospital is trying to give him. Please call

## 2019-09-24 NOTE — Telephone Encounter (Signed)
Pts wife michelle called in stating the pt and her have been given some confusing information and so they would like a call back from Dr. Sharol Given to clarify.   [phone]

## 2019-09-24 NOTE — Telephone Encounter (Signed)
I spoke with [facility] and Dr. Sharol Given. The patient is concerned that He is on a continuous infusion for 3 weeks . He is willing to do dosing of IV at home 2 or 3 times a day but he is worried about business appointments he has and the cost.  He does have some oral antibiotics at home Which he is wondering if he can take. I spoke with Dr. Sharol Given and  he relayed that he is taking a risk that the infection might return without the antibiotics and if so he would require another surgery.    Also patient was trying to cancel his appointment for tomorrow and reschedule for next week and didn't think this was done. He can be seen anytime next week as long as it is when Dr. Sharol Given is in

## 2019-09-24 NOTE — Progress Notes (Addendum)
NCM made aware by [facility] home infusion liaison/ Pam pt refusing written home IV ABX therapy. NCM spoke with pt @ bedside regarding d/c plan, and pt confirmed refusal of written home IV ABX plan,ampicillin IVPB, continuous infusion. NCM made hospitalist  aware. Whitman Hero RN,BSN,CM [phone]

## 2019-09-24 NOTE — Progress Notes (Signed)
Robert Sullivan [facility]  PROGRESS NOTE    Robert Sullivan  JFH:[id number] DOB: 02-Oct-1965 DOA: 09/11/2019 PCP: Robert Corti, PA-C   Brief Narrative:   Patient is a54 y.o.malewith history of Robert Sullivan cirrhosis-recent history of MSSA bacteremia-recurrent left gluteal abscess-s/p numerous readmissions/debridements-referred from GI office for evaluation of anasarca in the setting of decompensated liver cirrhosis. Was also found to have recurrence of his left gluteal abscess on CT imaging. Treated with diuretics with significant improvement in his volume status, subsequently evaluated by Dr. Linnell Sullivan underwent irrigation and debridement on 3/12. See below for further details.   09/24/19: Patient initially refused to be sent home on IV abx regimen as previously arranged. Wants to take oral medications. Spoke with ID and Orthopedics. He has been informed that this is not optimal therapy and he could face having to come back for further debridement. Spoke with wife. She states that they were under the impression that he would be on a continuous infusion. I informed her that the order is written for ampicillin 2g q4h. She reports that they have been on a similar regimen in the past and that it would not be a problem. It's too late today get the infusion company back on board. He will d/c in the AM.    Assessment & Plan:   Active Problems:   Obesity, Class III, BMI 40-49.9 (morbid obesity) (HCC)   Anasarca   Cirrhosis of liver with ascites (HCC)   Abscess, gluteal, left   Pleural effusion   Thrombocytopenia (HCC)   Ascites  Recurrent left gluteal abscess     - S/p ultrasound-guided aspiration on 3/6+ for Enterococcus, evaluated by Robert Sullivan and underwent repeat irrigation and debridement on 3/12.       - Intraoperative left gluteal/hip cultures done on 3/12 remain negative till 09/22/2019.       - ID following; with recommendations for 3 weeks of continuous ampicillin infusion-PICC line placed 09/19/2019 stop date for IV  infusion will be for 10/09/2019.       - Orthopedics recommending inpatient monitoring until perioperative wound is much improved.  As discussed with orthopedic surgeon Robert Sullivan on 09/23/2019, he will remove the wound VAC today and then monitor him for any signs of infection over the next 1-2 days.     - 09/24/19: ok for discharge today per ortho; however, Patient initially refused to be sent home on IV abx regimen as previously arranged. Wants to take oral medications. Spoke with ID and Orthopedics. He has been informed that this is not optimal therapy and he could face having to come back for further debridement. Spoke with wife. She states that they were under the impression that he would be on a continuous infusion. I informed her that the order is written for ampicillin 2g q4h. She reports that they have been on a similar regimen in the past and that it would not be a problem. It's too late today get the infusion company back on board. He will d/c in the AM.   Anasarca/ascites/bilateral pleural effusion     - Secondary to decompensated liver cirrhosis  UA without evidence of proteinuria, echo with preserved EF.      - Underwent large-volume paracentesis (9 L) on 3/6.      - Volume status continues to improve-I/O's not charted accurately; but weight decreased to 326 pounds (370 pounds on admission).      - Lower extremity Dopplers negative. Had a brief episode of hypotension yesterday which is suspected from post anesthesia/narcotics along  with Lasix Aldactone.     - will go home on lasix 73m qday and aldactone 1082m Portal hypertension seen on CT imaging     - Will need EGD at some point to confirm portal hypertensive gastropathy/survey for varices.     - Can be done in the outpatient setting.  Normocytic Anemia      - Underlying anemia of chronic disease worse due to perioperative blood loss, 2 units of packed RBCs ordered on 09/21/2019            - no signs of ongoing GI bleed.     -  09/24/19: Hgb is stable/improving today     - continue iron at discharge     - follow up CBC w/ PCP  Atypical chest pain     - Resolved-troponins negative.  Thrombocytopenia     - Mild-secondary to hypersplenism in the setting of liver cirrhosis.  DVT prophylaxis: lovenox Code Status: FULL Family Communication: Spoke with wife  (Robert [phone] phone.    Disposition Plan: D/c in AM w/ IV abx regimen.   Consultants:   Orthopedics  ID  Antimicrobials:  . ampicillin   ROS:  Reports leg pain . Remainder 10-pt ROS is negative for all not previously mentioned.  Subjective: No acute events ON per nursing.  Objective: Vitals:   09/23/19 1938 09/24/19 0604 09/24/19 0750 09/24/19 1416  BP: 121/61 120/62 118/68 111/66  Pulse: 89 89 89 92  Resp: 18 18 18 18   Temp: 98.2 F (36.8 C) 98.1 F (36.7 C) 98.2 F (36.8 C) 98.1 F (36.7 C)  TempSrc: Oral Oral Oral Oral  SpO2: 98% 99% 99% 96%  Weight:  (!) 149.9 kg    Height:        Intake/Output Summary (Last 24 hours) at 09/24/2019 1749 Last data filed at 09/24/2019 1705 Gross per 24 hour  Intake 1351.37 ml  Output 1950 ml  Net -598.63 ml   Filed Weights   09/23/19 0418 09/23/19 0800 09/24/19 0604  Weight: (!) 159.3 kg (!) 149.1 kg (!) 149.9 kg    Examination:  General: [age].o. male resting in bed in NAD Cardiovascular: RRR, +S1, S2, no m/g/r, equal pulses throughout Respiratory: CTABL, no w/r/r, normal WOB GI: BS+, NDNT, no masses noted, no organomegaly noted MSK: No e/c/c Neuro: A&O x 3, no focal deficits Psyc: Appropriate interaction and affect, calm/cooperative   Data Reviewed: I have personally reviewed following labs and imaging studies.  CBC: Recent Labs  Lab 09/20/19 0307 09/20/19 0307 09/21/19 0835 09/21/19 2324 09/22/19 0336 09/23/19 0240 09/24/19 0445  WBC 10.9*  --  4.7  --  4.1 4.2 4.0  NEUTROABS  --   --   --   --  2.0 2.1 2.0  HGB 8.1*   < > 5.8* 7.1* 7.1* 7.1* 7.3*  HCT 26.0*    < > 19.0* 22.7* 22.5* 22.8* 23.1*  MCV 80.5  --  81.5  --  82.4 82.9 81.9  PLT 193  --  95*  --  94* 93* 94*   < > = values in this interval not displayed.   Basic Metabolic Panel: Recent Labs  Lab 09/19/19 0412 09/19/19 2124 09/20/19 0307 09/21/19 0835 09/22/19 0336 09/23/19 0240 09/24/19 0445  NA 139   < > 135 135 136 137 137  K 3.6   < > 4.0 3.9 3.9 3.9 4.1  CL 101   < > 101 102 103 103 102  CO2 29   < >  25 28 28 28 26   GLUCOSE 123*   < > 143* 150* 111* 121* 102*  BUN 10   < > 17 23* 19 17 15   CREATININE 0.75   < > 0.97 0.82 0.84 0.88 0.83  CALCIUM 8.2*   < > 8.0* 7.9* 7.8* 7.9* 7.9*  MG 2.0  --   --  1.9 1.9 1.8 1.9   < > = values in this interval not displayed.   GFR: Estimated Creatinine Clearance: 162 mL/min (by C-G formula based on SCr of 0.83 mg/dL). Liver Function Tests: Recent Labs  Lab 09/19/19 0412 09/19/19 2124 09/20/19 0307 09/23/19 0240 09/24/19 0445  AST 40 47* 53* 62* 52*  ALT 12 15 15 18 16   ALKPHOS 84 81 86 87 87  BILITOT 0.8 1.3* 1.1 1.1 1.0  PROT 5.3* 5.1* 5.4* 5.3* 5.2*  ALBUMIN 2.1* 2.0* 2.1* 2.0* 1.9*   No results for input(s): LIPASE, AMYLASE in the last 168 hours. No results for input(s): AMMONIA in the last 168 hours. Coagulation Profile: No results for input(s): INR, PROTIME in the last 168 hours. Cardiac Enzymes: No results for input(s): CKTOTAL, CKMB, CKMBINDEX, TROPONINI in the last 168 hours. BNP (last 3 results) No results for input(s): PROBNP in the last 8760 hours. HbA1C: No results for input(s): HGBA1C in the last 72 hours. CBG: Recent Labs  Lab 09/23/19 1641 09/23/19 2020 09/24/19 0630 09/24/19 1152 09/24/19 1638  GLUCAP 119* 116* 99 133* 140*   Lipid Profile: No results for input(s): CHOL, HDL, LDLCALC, TRIG, CHOLHDL, LDLDIRECT in the last 72 hours. Thyroid Function Tests: No results for input(s): TSH, T4TOTAL, FREET4, T3FREE, THYROIDAB in the last 72 hours. Anemia Panel: No results for input(s): VITAMINB12,  FOLATE, FERRITIN, TIBC, IRON, RETICCTPCT in the last 72 hours. Sepsis Labs: Recent Labs  Lab 09/21/19 0835 09/22/19 0336 09/23/19 0240  PROCALCITON 0.36 0.31 0.24    Recent Results (from the past 240 hour(s))  Surgical pcr screen     Status: None   Collection Time: 09/18/19  5:26 PM   Specimen: Nasal Mucosa; Nasal Swab  Result Value Ref Range Status   MRSA, PCR NEGATIVE NEGATIVE Final   Staphylococcus aureus NEGATIVE NEGATIVE Final    Comment: (NOTE) The Xpert SA Assay (FDA approved for NASAL specimens in patients 63 years of age and older), is one component of a comprehensive surveillance program. It is not intended to diagnose infection nor to guide or monitor treatment. Performed at [facility], [place] [address], [place], [place] [address]   Aerobic/Anaerobic Culture (surgical/deep wound)     Status: None   Collection Time: 09/19/19  8:55 AM   Specimen: PATH Cytology Misc. fluid; Body Fluid  Result Value Ref Range Status   Specimen Description TISSUE LEFT HIP  Final   Special Requests A  Final   Gram Stain   Final    ABUNDANT WBC PRESENT, PREDOMINANTLY PMN NO ORGANISMS SEEN    Culture   Final    No growth aerobically or anaerobically. Performed at [facility], [place] [address], [place], [place] [address]    Report Status 09/24/2019 FINAL  Final  Aerobic/Anaerobic Culture (surgical/deep wound)     Status: None   Collection Time: 09/19/19 10:31 AM   Specimen: Soft Tissue, Other  Result Value Ref Range Status   Specimen Description FLUID LEFT HIP  Final   Special Requests NONE  Final   Gram Stain   Final    RARE WBC PRESENT, PREDOMINANTLY PMN  NO ORGANISMS SEEN    Culture   Final    No growth aerobically or anaerobically. Performed at [facility], [place] [address], [place], [place] [address]    Report Status 09/24/2019 FINAL  Final      Radiology Studies: No results found.   Scheduled Meds: . Chlorhexidine Gluconate Cloth  6 each Topical  Daily  . enoxaparin (LOVENOX) injection  40 mg Subcutaneous Q24H  . feeding supplement (PRO-STAT SUGAR FREE 64)  30 mL Oral QID  . ferrous sulfate  325 mg Oral TID WC  . folic acid  1 mg Oral Daily  . furosemide  60 mg Intravenous Daily  . insulin aspart  0-9 Units Subcutaneous TID WC  . multivitamin with minerals  1 tablet Oral Daily  . pantoprazole  40 mg Oral Daily  . polyethylene glycol  17 g Oral Daily  . potassium chloride  40 mEq Oral Daily  . sodium chloride flush  3 mL Intravenous Once  . spironolactone  100 mg Oral Daily   Continuous Infusions: . ampicillin (OMNIPEN) IV 2 g (09/24/19 1625)     LOS: 12 days    Time spent: 45 minutes spent in the coordination of care today.    Jonnie Finner, DO [facility]  If 7PM-7AM, please contact night-coverage [email or web address] 09/24/2019, 5:49 PM

## 2019-09-24 NOTE — Progress Notes (Signed)
Physical Therapy Treatment Patient Details Name: Ayvin Lipinski MRN: [id number] DOB: 05-May-1966 Today's Date: 09/24/2019    History of Present Illness 54 year old male with recent history of fall and subsequent gluteal abscess. Pt with recent hospitalization for this, has undergone IR drainage and x6 surgical I&Ds Dec 2020-Jan 2021 with excision of necrotic gluteal. Pt readmitted 3/4 with shortness of breath, fluid collection L gluteal soft tissue (pending culture), cirrhosis with ascites s/p paracentesis and L thigh aspiration 3/6. Left hip debridement and wound VAC placement 3/12. PMHx:DM2, OA, septic shock, obesity, and arthritis.    PT Comments    Pt is progress with functional mobility as demonstrated by ability to ambulate ~323f without AD, no LOB noted. He does demonstrate decreased WB on left side and dec step length on L potentially due to dressing on L LE, as he states it is bothersome. Pt demonstrates frustration with medical status and length of hospital stay. He appears motivated to improve functional mobility with ambulation and stairs and was encouraged to continue mobility acutely. Pt would continue to benefit from skilled physical therapy services at this time while admitted and after d/c to address the below listed limitations in order to improve overall safety and independence with functional mobility.   Follow Up Recommendations  Outpatient PT     Equipment Recommendations  None recommended by PT    Recommendations for Other Services       Precautions / Restrictions Precautions Precautions: Other (comment) Precaution Comments: monitor BP and HR    Mobility  Bed Mobility Overal bed mobility: Modified Independent Bed Mobility: Supine to Sit     Supine to sit: Modified independent (Device/Increase time)     General bed mobility comments: HOB elevated, use of bedrails.   Transfers Overall transfer level: Needs assistance Equipment used: None Transfers: Sit  to/from Stand Sit to Stand: Min guard Stand pivot transfers: Min guard       General transfer comment: min guard for safety  Ambulation/Gait Ambulation/Gait assistance: Modified independent (Device/Increase time) Gait Distance (Feet): 300 Feet Assistive device: IV Pole Gait Pattern/deviations: Step-through pattern;Wide base of support;Decreased stance time - left;Decreased weight shift to left     General Gait Details: pt pushing iv pole but not reliant on support   Stairs             Wheelchair Mobility    Modified Rankin (Stroke Patients Only)       Balance Overall balance assessment: No apparent balance deficits (not formally assessed) Sitting-balance support: No upper extremity supported;Feet supported Sitting balance-Leahy Scale: Good     Standing balance support: No upper extremity supported Standing balance-Leahy Scale: Good                              Cognition Arousal/Alertness: Awake/alert Behavior During Therapy: WFL for tasks assessed/performed Overall Cognitive Status: Within Functional Limits for tasks assessed                                 General Comments: Pt frustrated with medical status      Exercises      General Comments General comments (skin integrity, edema, etc.): Post-ambulation, seated: BP 130/85 HR 136. Asymptomatic throughout      Pertinent Vitals/Pain Pain Assessment: Faces Faces Pain Scale: Hurts a little bit Pain Location: L LE from dressing Pain Descriptors / Indicators: Nagging;Tightness Pain Intervention(s): Monitored during session;Repositioned  Home Living                      Prior Function            PT Goals (current goals can now be found in the care plan section) Acute Rehab PT Goals Patient Stated Goal: to get home soon PT Goal Formulation: With patient/family Time For Goal Achievement: 10/03/19 Potential to Achieve Goals: Good Progress towards PT goals:  Progressing toward goals    Frequency    Min 3X/week      PT Plan Current plan remains appropriate    Co-evaluation              AM-PAC PT "6 Clicks" Mobility   Outcome Measure  Help needed turning from your back to your side while in a flat bed without using bedrails?: None Help needed moving from lying on your back to sitting on the side of a flat bed without using bedrails?: None Help needed moving to and from a bed to a chair (including a wheelchair)?: A Little Help needed standing up from a chair using your arms (e.g., wheelchair or bedside chair)?: None Help needed to walk in hospital room?: A Little Help needed climbing 3-5 steps with a railing? : A Little 6 Click Score: 21    End of Session Equipment Utilized During Treatment: Gait belt Activity Tolerance: Patient tolerated treatment well Patient left: in chair;with call bell/phone within reach Nurse Communication: Mobility status PT Visit Diagnosis: Other abnormalities of gait and mobility (R26.89);Muscle weakness (generalized) (M62.81)     Time: 4158-3094 PT Time Calculation (min) (ACUTE ONLY): 17 min  Charges:  $Gait Training: 8-22 mins                     [place], SPT Acute Rehab  [phone]    Kariem Wolfson 09/24/2019, 3:14 PM

## 2019-09-25 ENCOUNTER — Ambulatory Visit: Payer: Self-pay | Admitting: Orthopedic Surgery

## 2019-09-25 DIAGNOSIS — K766 Portal hypertension: Secondary | ICD-10-CM

## 2019-09-25 LAB — CBC WITH DIFFERENTIAL/PLATELET
Abs Immature Granulocytes: 0.02 10*3/uL (ref 0.00–0.07)
Basophils Absolute: 0.1 10*3/uL (ref 0.0–0.1)
Basophils Relative: 1 %
Eosinophils Absolute: 0.3 10*3/uL (ref 0.0–0.5)
Eosinophils Relative: 7 %
HCT: 23.4 % — ABNORMAL LOW (ref 39.0–52.0)
Hemoglobin: 7.2 g/dL — ABNORMAL LOW (ref 13.0–17.0)
Immature Granulocytes: 1 %
Lymphocytes Relative: 34 %
Lymphs Abs: 1.3 10*3/uL (ref 0.7–4.0)
MCH: 25.4 pg — ABNORMAL LOW (ref 26.0–34.0)
MCHC: 30.8 g/dL (ref 30.0–36.0)
MCV: 82.7 fL (ref 80.0–100.0)
Monocytes Absolute: 0.4 10*3/uL (ref 0.1–1.0)
Monocytes Relative: 11 %
Neutro Abs: 1.8 10*3/uL (ref 1.7–7.7)
Neutrophils Relative %: 46 %
Platelets: 91 10*3/uL — ABNORMAL LOW (ref 150–400)
RBC: 2.83 MIL/uL — ABNORMAL LOW (ref 4.22–5.81)
RDW: 17.4 % — ABNORMAL HIGH (ref 11.5–15.5)
WBC: 3.8 10*3/uL — ABNORMAL LOW (ref 4.0–10.5)
nRBC: 0 % (ref 0.0–0.2)

## 2019-09-25 LAB — COMPREHENSIVE METABOLIC PANEL
ALT: 15 U/L (ref 0–44)
AST: 44 U/L — ABNORMAL HIGH (ref 15–41)
Albumin: 1.9 g/dL — ABNORMAL LOW (ref 3.5–5.0)
Alkaline Phosphatase: 82 U/L (ref 38–126)
Anion gap: 9 (ref 5–15)
BUN: 15 mg/dL (ref 6–20)
CO2: 26 mmol/L (ref 22–32)
Calcium: 7.9 mg/dL — ABNORMAL LOW (ref 8.9–10.3)
Chloride: 102 mmol/L (ref 98–111)
Creatinine, Ser: 0.84 mg/dL (ref 0.61–1.24)
GFR calc Af Amer: >60 mL/min (ref >60)
GFR calc non Af Amer: >60 mL/min (ref >60)
Glucose, Bld: 100 mg/dL — ABNORMAL HIGH (ref 70–99)
Potassium: 3.6 mmol/L (ref 3.5–5.1)
Sodium: 137 mmol/L (ref 135–145)
Total Bilirubin: 0.9 mg/dL (ref 0.3–1.2)
Total Protein: 5.3 g/dL — ABNORMAL LOW (ref 6.5–8.1)

## 2019-09-25 LAB — MAGNESIUM: Magnesium: 1.8 mg/dL (ref 1.7–2.4)

## 2019-09-25 LAB — GLUCOSE, CAPILLARY
Glucose-Capillary: 91 mg/dL (ref 70–99)
Glucose-Capillary: 95 mg/dL (ref 70–99)
Glucose-Capillary: 136 mg/dL — ABNORMAL HIGH (ref 70–99)

## 2019-09-25 LAB — C-REACTIVE PROTEIN: CRP: 1.9 mg/dL — ABNORMAL HIGH (ref <1.0)

## 2019-09-25 MED ORDER — HEPARIN SOD (PORK) LOCK FLUSH 100 UNIT/ML IV SOLN
250.0000 [IU] | INTRAVENOUS | Status: AC | PRN
  Administered 2019-09-25: 250 [IU]
  Filled 2019-09-25: qty 2.5

## 2019-09-25 MED ORDER — AMPICILLIN IV (FOR PTA / DISCHARGE USE ONLY): 2.0000 g | INTRAVENOUS | 0 refills | Status: DC

## 2019-09-25 NOTE — TOC Transition Note (Signed)
Transition of Care [facility]) - CM/SW Discharge Note   Patient Details  Name: Robert Sullivan MRN: [id number] Date of Birth: 10/03/1965  Transition of [facility] CM/SW Contact:  Sharin Mons, RN Phone Number: 09/25/2019, 3:04 PM   Clinical Narrative:    Late entry:1400 Admitted with Anasarca evaluation, s/p I&D Lhip, 3/12. Pt will transition to home with home health services. Pt requiring LT IV ABX therapy. Pt to resume home health services with [facility]. Pt without DME needs.  Wife to provide transportation to home  Final next level of care: [place] Barriers to Discharge: No Barriers Identified   Patient Goals and CMS Choice Patient states their goals for this hospitalization and ongoing recovery are:: to go home and take care of self      Discharge Placement                       Discharge Plan and Services   Discharge Planning Services: CM Consult              DME Agency: Other - Comment(IV ABX therapy) Date DME Agency Contacted: 09/25/19 Time DME Agency Contacted: [phone] Representative spoke with at DME Agency: [facility] Arranged: RN [place] Agency: [facility] Date Startex: 09/25/19 Time Portal: 1502 Representative spoke with at [place]: [place] ([place]) Interventions     Readmission Risk Interventions Readmission Risk Prevention Plan 09/16/2019  Transportation Screening Complete  PCP or Specialist Appt within 3-5 Days Complete  HRI or [place] Patient refused  Social Work Consult for [place] Planning/Counseling Patient refused  Palliative Care Screening Not Applicable  Medication Review Press photographer) Complete

## 2019-09-25 NOTE — Progress Notes (Signed)
PHARMACY CONSULT NOTE FOR:  OUTPATIENT  PARENTERAL ANTIBIOTIC THERAPY (OPAT)  Indication: E faecalis gluteal abscess Regimen: Ampicillin 2 gm q 4 hours  End date: 10/09/19  IV antibiotic discharge orders are pended. To discharging provider:  please sign these orders via discharge navigator,  Select New Orders & click on the button choice - Manage This Unsigned Work.     Thank you for allowing pharmacy to be a part of this patient's care.  Jimmy Footman, PharmD, BCPS, [facility] Clinical Pharmacist Phone: [phone] 09/25/2019, 7:55 AM

## 2019-09-25 NOTE — Progress Notes (Signed)
Pt AVS reviewed, all questions answered. IV team flush and cap PICC for home use and changed dressing. Pt dressing and belongings gathered, awaiting volunteer to take downstairs for d/c with SO.

## 2019-09-25 NOTE — Telephone Encounter (Signed)
Please see message below

## 2019-09-25 NOTE — Telephone Encounter (Signed)
Dr Sharol Given spoke with Sharyn Lull last night

## 2019-09-25 NOTE — Plan of Care (Signed)

## 2019-09-25 NOTE — Discharge Summary (Signed)
. Physician Discharge Summary  Robert Sullivan DEY:[id number] DOB: Apr 13, 1966 DOA: 09/11/2019  PCP: Cathleen Corti, PA-C  Admit date: 09/11/2019 Discharge date: 09/25/2019  Admitted From: Home Disposition:  Discharged to home.  Recommendations for Outpatient Follow-up:  1. Follow up with PCP in 1-2 weeks 2. Please obtain BMP/CBC in one week 3. Follow up with ID. Call for appt.  4. Follow up with Orthopedics as scheduled.  Discharge Condition: Stable  CODE STATUS: FULL   Brief/Interim Summary: Patient is a54 y.o.malewith history of Karlene Lineman cirrhosis-recent history of MSSA bacteremia-recurrent left gluteal abscess-s/p numerous readmissions/debridements-referred from GI office for evaluation of anasarca in the setting of decompensated liver cirrhosis. Was also found to have recurrence of his left gluteal abscess on CT imaging. Treated with diuretics with significant improvement in his volume status, subsequently evaluated by Dr. Linnell Fulling underwent irrigation and debridement on 3/12. See below for further details.  09/25/19: No acute events ON. Will continue IV abx through 10/09/19. Will go home on lasix/aldactone combination. Follow up with PCP in 1 week. [place] for discharge today.  Discharge Diagnoses:  Active Problems:   Obesity, Class III, BMI 40-49.9 (morbid obesity) (HCC)   Anasarca   Cirrhosis of liver with ascites (HCC)   Abscess, gluteal, left   Pleural effusion   Thrombocytopenia (HCC)   Ascites  Recurrent left gluteal abscess - S/p ultrasound-guided aspiration on 3/6+ for Enterococcus, evaluated by Dr. Sharol Given and underwent repeat irrigation and debridement on 3/12.  - Intraoperative left gluteal/hip cultures done on 3/12 remain negative till 09/22/2019.  - ID following; with recommendations for 3 weeks of continuous ampicillin infusion-PICC line placed 09/19/2019 stop date for IV infusion will be for 10/09/2019.  - Orthopedics recommending inpatient monitoring until  perioperative wound is much improved. As discussed with orthopedic surgeon Dr. Sharol Given on 09/23/2019, he will remove the wound VAC today and then monitor him for any signs of infection over the next 1-2 days. - 09/24/19: ok for discharge today per ortho; however, Patient initially refused to be sent home on IV abx regimen as previously arranged. Wants to take oral medications. Spoke with ID and Orthopedics. He has been informed that this is not optimal therapy and he could face having to come back for further debridement. Spoke with wife. She states that they were under the impression that he would be on a continuous infusion. I informed her that the order is written for ampicillin 2g q4h. She reports that they have been on a similar regimen in the past and that it would not be a problem. It's too late today get the infusion company back on board. He will d/c in the AM.     - 09/25/19: ok to discharge today on above regimen    Anasarca/ascites/bilateral pleural effusion - Secondary to decompensated liver cirrhosis UA without evidence of proteinuria, echo with preserved EF.  - Underwent large-volume paracentesis (9 L) on 3/6.  - Volume status continues to improve-I/O's not charted accurately; but weight decreased to 326 pounds (370 pounds on admission).  - Lower extremity Dopplers negative. Had a brief episode of hypotension yesterday which is suspected from post anesthesia/narcotics along with Lasix Aldactone. - will go home on lasix 44m qday and aldactone 1029m Portal hypertension seen on CT imaging - Will need EGD at some point to confirm portal hypertensive gastropathy/survey for varices. - Can be done in the outpatient setting; follow up GI instructions in chart.  Normocytic Anemia  - Underlying anemia of chronic disease worse due to  perioperative blood loss, 2 units of packed RBCs ordered on 09/21/2019      - no signs of ongoing GI bleed. -  09/24/19: Hgb is stable/improving today - continue iron at discharge - follow up CBC w/ PCP  Atypical chest pain - Resolved-troponins negative.  Thrombocytopenia - Mild-secondary to hypersplenism in the setting of liver cirrhosis.  Discharge Instructions  Discharge Instructions    Home infusion instructions   Complete by: As directed    Instructions: Flushing of vascular access device: 0.9% NaCl pre/post medication administration and prn patency; Heparin 100 u/ml, 79m for implanted ports and Heparin 10u/ml, 560mfor all other central venous catheters.     Allergies as of 09/25/2019      Reactions   Sulfa Antibiotics Rash   Total body rash      Medication List    TAKE these medications   ampicillin  IVPB Inject 2 g into the vein every 4 (four) hours for 14 days. Indication:  Enterococcal gluteal abscess Last Day of Therapy:  10/09/19 Labs - Once weekly:  CBC/D and BMP, Labs - Every other week:  ESR and CRP   aspirin 325 MG tablet Take 1 tablet (325 mg total) by mouth daily. What changed:   when to take this  reasons to take this   blood glucose meter kit and supplies Kit Dispense based on patient and insurance preference. Use up to four times daily as directed. (FOR ICD-9 250.00, 250.01).   Ensure Max Protein Liqd Take 330 mLs (11 oz total) by mouth daily.   ferrous sulfate 325 (65 FE) MG tablet Take 1 tablet (325 mg total) by mouth daily with breakfast.   furosemide 40 MG tablet Commonly known as: Lasix Take 1 tablet (40 mg total) by mouth daily.   insulin glargine 100 UNIT/ML injection Commonly known as: LANTUS Inject 0.24 mLs (24 Units total) into the skin daily. What changed: how much to take   Insulin Syringes (Disposable) U-100 0.5 ML Misc Use as directed 3 times daily AC.   multivitamin with minerals Tabs tablet Take 1 tablet by mouth daily.   NovoLOG 100 UNIT/ML injection Generic drug: insulin aspart Inject 4 Units into the skin  in the morning and at bedtime.   Oxycodone HCl 10 MG Tabs Take 1 tablet (10 mg total) by mouth every 6 (six) hours as needed for up to 5 days for severe pain.   potassium chloride SA 20 MEQ tablet Commonly known as: KLOR-CON Take 2 tablets (40 mEq total) by mouth daily.   spironolactone 100 MG tablet Commonly known as: ALDACTONE Take 1 tablet (100 mg total) by mouth daily.            Home Infusion Instuctions  (From admission, onward)         Start     Ordered   09/25/19 0000  Home infusion instructions    Question:  Instructions  Answer:  Flushing of vascular access device: 0.9% NaCl pre/post medication administration and prn patency; Heparin 100 u/ml, 31m89mor implanted ports and Heparin 10u/ml, 31ml60mr all other central venous catheters.   09/25/19 0801   09/24/19 0000  Home infusion instructions    Question:  Instructions  Answer:  Flushing of vascular access device: 0.9% NaCl pre/post medication administration and prn patency; Heparin 100 u/ml, 31ml 22m implanted ports and Heparin 10u/ml, 31ml f90mall other central venous catheters.   09/24/19 1104         Follow-up Information  Levin Erp, [place] Follow up on 10/01/2019.   Specialty: Gastroenterology Why: 9 AM follow up cirrhosis and ascites Contact information: [address] [place] [address] [phone]        Mansouraty, Telford Nab., MD Follow up on 11/06/2019.   Specialties: Gastroenterology, Internal Medicine Why: 1:50 PM follow up for cirrhosis and ascites with GI MD. Contact information: [place] [address] [phone]        Newt Minion, MD In 1 week.   Specialty: Orthopedic Surgery Contact information: [place] [address] [phone]        Carlyle Basques, MD. Schedule an appointment as soon as possible for a visit in 2 week(s).   Specialty: Infectious Diseases Contact information: [address] [place]   [phone]          Allergies  Allergen Reactions  . Sulfa Antibiotics Rash    Total body rash    Consultations:  Infectious Disease  Orthopedics   Procedures/Studies: DG Chest 2 View  Result Date: 09/11/2019 CLINICAL DATA:  54 year old male with chest pain and shortness of breath. EXAM: CHEST - 2 VIEW COMPARISON:  Chest radiograph dated 06/19/2019. FINDINGS: There is shallow inspiration with bibasilar atelectasis. No focal consolidation, pleural effusion, or pneumothorax. Stable cardiac silhouette. No acute osseous pathology. IMPRESSION: No acute cardiopulmonary process. Electronically Signed   By: Anner Crete M.D.   On: 09/11/2019 17:57   MR HIP LEFT W WO CONTRAST  Result Date: 09/13/2019 CLINICAL DATA:  Severe left hip pain. Follow-up gluteal abscess. EXAM: MRI OF THE LEFT HIP WITHOUT AND WITH CONTRAST TECHNIQUE: Multiplanar, multisequence MR imaging was performed both before and after administration of intravenous contrast. CONTRAST:  1m GADAVIST GADOBUTROL 1 MMOL/ML IV SOLN COMPARISON:  CT scan 09/12/2019 and prior MRI 07/10/2019 and 06/13/2019 FINDINGS: Examination is quite limited due to body habitus and patient motion. As demonstrated on the CT scan there is a large thick walled abscess in the left gluteal area in the gluteus maximus muscle laterally. This measures approximately 19 x 5.5 cm and contains gas and demonstrates moderate rim enhancement. There is also surrounding inflammatory changes with significant myofasciitis involving the left gluteal muscles and to a lesser extent the left hip musculature. No definite findings for other abscesses. No findings suspicious for septic arthritis involving the left hip or osteomyelitis involving the left femur or left hemipelvis. Mild myositis involving the right hip and pelvic muscles. The pubic symphysis and SI joints are intact. No findings for septic arthritis. Diffuse subcutaneous soft tissue swelling/edema/fluid  consistent with cellulitis. Mildly enlarged inguinal lymph nodes are likely inflammatory/hyperplastic. Large volume pelvic ascites related to patient's known cirrhosis. IMPRESSION: 1. 19 x 5.5 cm thick walled abscess in the left gluteus maximus muscle laterally. 2. Significant surrounding myofasciitis. 3. No findings for septic arthritis involving the left hip or left hemipelvis. 4. Large volume pelvic ascites related to patient's known cirrhosis. 5. Mild myositis involving the right hip and pelvic muscles. Electronically Signed   By: PMarijo SanesM.D.   On: 09/13/2019 10:20   CT ABDOMEN PELVIS W CONTRAST  Result Date: 09/12/2019 CLINICAL DATA:  Abdominal distension, ascites EXAM: CT ABDOMEN AND PELVIS WITH CONTRAST TECHNIQUE: Multidetector CT imaging of the abdomen and pelvis was performed using the standard protocol following bolus administration of intravenous contrast. CONTRAST:  1044mOMNIPAQUE IOHEXOL 300 MG/ML  SOLN COMPARISON:  CT 06/20/2019 FINDINGS: Lower chest: Small left and trace right pleural  effusions with associated compressive atelectasis. Normal heart size. No pericardial effusion. Hepatobiliary: Shrunken, nodular appearance of the liver suggesting cirrhosis. No focal liver lesion identified. Prior cholecystectomy. Pancreas: Appears unremarkable without pancreatic ductal dilatation. No definite peripancreatic inflammatory changes although fluid from adjacent ascites slightly limits the evaluation. Spleen: Splenomegaly. Spleen measures 20 cm in length. Adrenals/Urinary Tract: Unremarkable adrenal glands. 1.6 cm lower pole right renal cyst. Kidneys appear otherwise within normal limits. No renal calculi. No hydronephrosis. Nondilated ureters. Urinary bladder appears unremarkable. Stomach/Bowel: Stomach is within normal limits. Appendix not clearly visualized. Scattered colonic diverticulosis. No evidence of bowel wall thickening, distention, or inflammatory changes. Vascular/Lymphatic: Abdominal  aorta is nonaneurysmal. Recanalization of the umbilical vein. Multiple upper abdominal varices suggesting portal hypertension. Portal vein appears patent. Small upper abdominal lymph nodes including within the porta hepatis. Reproductive: Prostate is unremarkable. Other: Moderate to large volume ascites. No pneumoperitoneum. Musculoskeletal: Rim enhancing fluid and air collection within the left gluteal soft tissues at site of previously seen gluteal abscess. Approximate measurements of the collection are 6.8 x 4.1 cm trans-axially by 16.7 cm craniocaudally (series 3, image 106; series 10, image 156). Extensive surrounding subcutaneous edema overlying the left hip and proximal thigh as well as the bilateral flanks. No acute osseous abnormality. No areas of cortical destruction to suggest osteomyelitis. IMPRESSION: 1. Rim-enhancing fluid and air collection within the left gluteal soft tissues at site of previously seen gluteal abscess. Approximate measurements of the collection are 6.8 x 4.1 x 16.7 cm. No areas of underlying cortical destruction to suggest osteomyelitis. 2. Cirrhosis with stigmata of portal hypertension including splenomegaly and moderate to large volume ascites. 3. Small left and trace right pleural effusions. These results were called by telephone at the time of interpretation on 09/12/2019 at 10:18 am to provider [facility] , who verbally acknowledged these results. Electronically Signed   By: Davina Poke D.O.   On: 09/12/2019 10:19   IR US Guide Bx Asp/Drain  Result Date: 09/13/2019 INDICATION: New diagnosis of cirrhosis, now with symptomatic intra-abdominal ascites. Additionally, patient with history of superinfected hematoma previously requiring image guided drainage catheter placement on 06/17/2019 with multiple subsequent operative debridement. Preceding CT scan demonstrated a recurrent fluid collection about the left lateral thigh and as such request made for image guided  aspiration for diagnostic purposes. EXAM: 1. ULTRASOUND-GUIDED PARACENTESIS 2. ULTRASOUND-GUIDED ASPIRATION OF INDETERMINATE RECURRENT FLUID COLLECTION WITHIN THE SUPEROLATERAL ASPECT OF THE LEFT THIGH. COMPARISON:  CT abdomen and pelvis-10/02/2019; image guided left lateral thigh percutaneous drainage catheter placement-06/17/2019 MEDICATIONS: None. COMPLICATIONS: None immediate. TECHNIQUE: Informed written consent was obtained from the patient after a discussion of the risks, benefits and alternatives to treatment. A timeout was performed prior to the initiation of the procedure. Initial ultrasound scanning demonstrates a moderate amount of ascites within the left lower abdomen which was subsequently prepped and draped in the usual sterile fashion. 1% lidocaine with epinephrine was used for local anesthesia. An ultrasound image was saved for documentation purposed. An 8 Fr Safe-T-Centesis catheter was introduced. The paracentesis was performed ultimately yielding 9 L of serous ascitic fluid. A representative sample was capped and sent to the laboratory for analysis. The catheter was removed. _________________________________________________________ Attention was now paid towards ultrasound-guided aspiration of recurrent indeterminate fluid collection within the subcutaneous tissues about the anterior superolateral aspect the left thigh. Sonographic evaluation demonstrates a at least 15.1 x 5.9 cm minimally complex fluid collection within the subcutaneous tissues about the superolateral aspect of the left thigh, subjacent to overlying surgical incision.  The skin overlying the operative site with prepped and draped in usual sterile fashion. 1% lidocaine with epinephrine was utilized for local anesthesia. Under direct ultrasound guidance, an 18 gauge trocar needle was advanced into a cystic component of the complex fluid collection and a short Amplatz wire was coiled within the collection. Multiple image was saved  procedural documentation purposes Next, the 18 gauge trocar needle was exchanged for a 15 cm Yueh sheath catheter which was utilized to aspirate approximately 350 cc of serous non foul smelling fluid as the catheter was slowly retracted. A representative sample was capped and sent to the laboratory for analysis. Dressings were applied. The patient tolerated the above procedures well without immediate postprocedural complication. FINDINGS: A total of approximately 9 liters of serous fluid was removed. Technically successful ultrasound-guided aspiration of 350 cc of serous non foul smelling fluid from minimally complex fluid collection within the subcutaneous tissues about the superolateral aspect the left thigh, subjacent to the overlying surgical incision. Representative samples were sent separately from each collection to the laboratory for analysis. IMPRESSION: 1. Successful ultrasound-guided paracentesis yielding 9 liters of peritoneal fluid. 2. Successful ultrasound-guided aspiration of 350 cc of serous, non foul smelling fluid from minimally complex recurrent fluid collection with the subcutaneous tissues about the superolateral aspect of the left thigh, subjacent to the overlying surgical incision. 3. Representative samples were sent separately from each collection to the laboratory for analysis. Electronically Signed   By: Sandi Mariscal M.D.   On: 09/13/2019 14:24   ECHOCARDIOGRAM COMPLETE  Result Date: 09/12/2019    ECHOCARDIOGRAM REPORT   Patient Name:   MABEL UNREIN Date of Exam: 09/12/2019 Medical Rec #:  [id number]   Height:       75.0 in Accession #:    [id number]  Weight:       370.0 lb Date of Birth:  1966-02-06  BSA:          2.850 m Patient Age:    25 years    BP:           144/82 mmHg Patient Gender: M           HR:           95 bpm. Exam Location:  Inpatient Procedure: 2D Echo Indications:    acute diastolic chf 784.69  History:        Patient has prior history of Echocardiogram examinations, most                  recent 06/12/2019. Cirrhosis of liver with ascites. pleural                 effusion.  Sonographer:    Johny Chess Referring Phys: [phone] DAVID TAT  Sonographer Comments: Patient is morbidly obese. Image acquisition challenging due to respiratory motion and Image acquisition challenging due to patient body habitus. IMPRESSIONS  1. Normal LV function; trace MR; ascites noted on subcostal views.  2. Left ventricular ejection fraction, by estimation, is 60 to 65%. The left ventricle has normal function. The left ventricle has no regional wall motion abnormalities. Left ventricular diastolic parameters were normal.  3. Right ventricular systolic function is normal. The right ventricular size is normal.  4. The mitral valve is normal in structure and function. No evidence of mitral valve regurgitation. No evidence of mitral stenosis.  5. The aortic valve is tricuspid. Aortic valve regurgitation is not visualized. No aortic stenosis is present.  6. The inferior vena cava is normal in  size with greater than 50% respiratory variability, suggesting right atrial pressure of 3 mmHg. FINDINGS  Left Ventricle: Left ventricular ejection fraction, by estimation, is 60 to 65%. The left ventricle has normal function. The left ventricle has no regional wall motion abnormalities. The left ventricular internal cavity size was normal in size. There is  no left ventricular hypertrophy. Left ventricular diastolic parameters were normal. Right Ventricle: The right ventricular size is normal. Right ventricular systolic function is normal. Left Atrium: Left atrial size was normal in size. Right Atrium: Right atrial size was normal in size. Pericardium: There is no evidence of pericardial effusion. Mitral Valve: The mitral valve is normal in structure and function. Normal mobility of the mitral valve leaflets. No evidence of mitral valve regurgitation. No evidence of mitral valve stenosis. Tricuspid Valve: The tricuspid valve  is normal in structure. Tricuspid valve regurgitation is trivial. No evidence of tricuspid stenosis. Aortic Valve: The aortic valve is tricuspid. Aortic valve regurgitation is not visualized. No aortic stenosis is present. Pulmonic Valve: The pulmonic valve was normal in structure. Pulmonic valve regurgitation is trivial. No evidence of pulmonic stenosis. Aorta: The aortic root is normal in size and structure. Venous: The inferior vena cava is normal in size with greater than 50% respiratory variability, suggesting right atrial pressure of 3 mmHg. IAS/Shunts: No atrial level shunt detected by color flow Doppler. Additional Comments: Normal LV function; trace MR; ascites noted on subcostal views.  LEFT VENTRICLE PLAX 2D LVIDd:         4.69 cm  Diastology LVIDs:         2.91 cm  LV e' lateral: 16.50 cm/s LV PW:         0.96 cm  LV e' medial:  10.20 cm/s LV IVS:        0.95 cm LVOT diam:     2.30 cm LV SV:         88 LV SV Index:   31 LVOT Area:     4.15 cm  LEFT ATRIUM           Index       RIGHT ATRIUM           Index LA diam:      4.60 cm 1.61 cm/m  RA Area:     12.80 cm LA Vol (A2C): 55.7 ml 19.54 ml/m RA Volume:   24.00 ml  8.42 ml/m  AORTIC VALVE LVOT Vmax:   110.00 cm/s LVOT Vmean:  69.800 cm/s LVOT VTI:    0.211 m  AORTA Ao Root diam: 3.30 cm  SHUNTS Systemic VTI:  0.21 m Systemic Diam: 2.30 cm Kirk Ruths MD Electronically signed by Kirk Ruths MD Signature Date/Time: 09/12/2019/4:38:39 PM    Final    VAS [place] LOWER EXTREMITY VENOUS (DVT)  Result Date: 09/14/2019  Lower Venous DVTStudy Indications: Edema. Other Indications: Anasarca. Risk Factors: Decompensated liver cirrhosis with anasarca. Limitations: Body habitus and interstitial edema. Comparison Study: No prior study on file Performing Technologist: Sharion Dove RVS  Examination Guidelines: A complete evaluation includes B-mode imaging, spectral Doppler, color Doppler, and power Doppler as needed of all accessible portions of each vessel.  Bilateral testing is considered an integral part of a complete examination. Limited examinations for reoccurring indications may be performed as noted. The reflux portion of the exam is performed with the patient in reverse Trendelenburg.  +---------+---------------+---------+-----------+----------+-------------------+ RIGHT    CompressibilityPhasicitySpontaneityPropertiesThrombus Aging      +---------+---------------+---------+-----------+----------+-------------------+ CFV      Full  Yes      Yes                                      +---------+---------------+---------+-----------+----------+-------------------+ SFJ      Full                                                             +---------+---------------+---------+-----------+----------+-------------------+ FV Prox  Full           Yes      Yes                                      +---------+---------------+---------+-----------+----------+-------------------+ FV Mid                                                Not visualized      +---------+---------------+---------+-----------+----------+-------------------+ FV Distal                                             Not visualized      +---------+---------------+---------+-----------+----------+-------------------+ PFV      Full                                                             +---------+---------------+---------+-----------+----------+-------------------+ POP                     Yes      Yes                  patent by color and                                                       Doppler             +---------+---------------+---------+-----------+----------+-------------------+ PTV                                                   patent by color     +---------+---------------+---------+-----------+----------+-------------------+ PERO                                                  patent by color      +---------+---------------+---------+-----------+----------+-------------------+   +---------+---------------+---------+-----------+----------+-------------------+ LEFT     CompressibilityPhasicitySpontaneityPropertiesThrombus Aging      +---------+---------------+---------+-----------+----------+-------------------+  CFV      Full           Yes      Yes                                      +---------+---------------+---------+-----------+----------+-------------------+ SFJ      Full                                                             +---------+---------------+---------+-----------+----------+-------------------+ FV Prox                 Yes      Yes                  patent by color and                                                       Doppler             +---------+---------------+---------+-----------+----------+-------------------+ FV Mid                  Yes      Yes                  patent by color and                                                       Doppler             +---------+---------------+---------+-----------+----------+-------------------+ FV Distal               Yes      Yes                  patent by color and                                                       Doppler             +---------+---------------+---------+-----------+----------+-------------------+ PFV                                                   Not visualized      +---------+---------------+---------+-----------+----------+-------------------+ POP      Full           Yes      Yes                                      +---------+---------------+---------+-----------+----------+-------------------+ PTV  patent by color     +---------+---------------+---------+-----------+----------+-------------------+ PERO                                                  patent by color      +---------+---------------+---------+-----------+----------+-------------------+     Summary: RIGHT: - There is no evidence of deep vein thrombosis in the lower extremity. However, portions of this examination were limited- see technologist comments above.  LEFT: - There is no evidence of deep vein thrombosis in the lower extremity. However, portions of this examination were limited- see technologist comments above.  *See table(s) above for measurements and observations. Electronically signed by Monica Martinez MD on 09/14/2019 at 11:02:56 AM.    Final    [place] EKG SITE RITE  Result Date: 09/19/2019 If Site Rite image not attached, placement could not be confirmed due to current cardiac rhythm.  IR Paracentesis  Result Date: 09/13/2019 INDICATION: New diagnosis of cirrhosis, now with symptomatic intra-abdominal ascites. Additionally, patient with history of superinfected hematoma previously requiring image guided drainage catheter placement on 06/17/2019 with multiple subsequent operative debridement. Preceding CT scan demonstrated a recurrent fluid collection about the left lateral thigh and as such request made for image guided aspiration for diagnostic purposes. EXAM: 1. ULTRASOUND-GUIDED PARACENTESIS 2. ULTRASOUND-GUIDED ASPIRATION OF INDETERMINATE RECURRENT FLUID COLLECTION WITHIN THE SUPEROLATERAL ASPECT OF THE LEFT THIGH. COMPARISON:  CT abdomen and pelvis-10/02/2019; image guided left lateral thigh percutaneous drainage catheter placement-06/17/2019 MEDICATIONS: None. COMPLICATIONS: None immediate. TECHNIQUE: Informed written consent was obtained from the patient after a discussion of the risks, benefits and alternatives to treatment. A timeout was performed prior to the initiation of the procedure. Initial ultrasound scanning demonstrates a moderate amount of ascites within the left lower abdomen which was subsequently prepped and draped in the usual sterile fashion. 1% lidocaine with epinephrine was  used for local anesthesia. An ultrasound image was saved for documentation purposed. An 8 Fr Safe-T-Centesis catheter was introduced. The paracentesis was performed ultimately yielding 9 L of serous ascitic fluid. A representative sample was capped and sent to the laboratory for analysis. The catheter was removed. _________________________________________________________ Attention was now paid towards ultrasound-guided aspiration of recurrent indeterminate fluid collection within the subcutaneous tissues about the anterior superolateral aspect the left thigh. Sonographic evaluation demonstrates a at least 15.1 x 5.9 cm minimally complex fluid collection within the subcutaneous tissues about the superolateral aspect of the left thigh, subjacent to overlying surgical incision. The skin overlying the operative site with prepped and draped in usual sterile fashion. 1% lidocaine with epinephrine was utilized for local anesthesia. Under direct ultrasound guidance, an 18 gauge trocar needle was advanced into a cystic component of the complex fluid collection and a short Amplatz wire was coiled within the collection. Multiple image was saved procedural documentation purposes Next, the 18 gauge trocar needle was exchanged for a 15 cm Yueh sheath catheter which was utilized to aspirate approximately 350 cc of serous non foul smelling fluid as the catheter was slowly retracted. A representative sample was capped and sent to the laboratory for analysis. Dressings were applied. The patient tolerated the above procedures well without immediate postprocedural complication. FINDINGS: A total of approximately 9 liters of serous fluid was removed. Technically successful ultrasound-guided aspiration of 350 cc of serous non foul smelling fluid from minimally complex fluid collection within the subcutaneous tissues about the superolateral aspect  the left thigh, subjacent to the overlying surgical incision. Representative samples were  sent separately from each collection to the laboratory for analysis. IMPRESSION: 1. Successful ultrasound-guided paracentesis yielding 9 liters of peritoneal fluid. 2. Successful ultrasound-guided aspiration of 350 cc of serous, non foul smelling fluid from minimally complex recurrent fluid collection with the subcutaneous tissues about the superolateral aspect of the left thigh, subjacent to the overlying surgical incision. 3. Representative samples were sent separately from each collection to the laboratory for analysis. Electronically Signed   By: Sandi Mariscal M.D.   On: 09/13/2019 14:24      Subjective: No acute events ON.   Discharge Exam: Vitals:   09/24/19 2022 09/25/19 0506  BP: (!) 112/52 (!) 111/52  Pulse: 86 84  Resp: 18 18  Temp: 98.4 F (36.9 C) 98.2 F (36.8 C)  SpO2: 99% 99%   Vitals:   09/24/19 0750 09/24/19 1416 09/24/19 2022 09/25/19 0506  BP: 118/68 111/66 (!) 112/52 (!) 111/52  Pulse: 89 92 86 84  Resp: _0 Temp: 98.2 F (36.8 C) 98.1 F (36.7 C) 98.4 F (36.9 C) 98.2 F (36.8 C)  TempSrc: Oral Oral Oral Oral  SpO2: 99% 96% 99% 99%  Weight:    (!) 148.6 kg  Height:        General: 54 y.o. male resting in bed in NAD Cardiovascular: RRR, +S1, S2, no m/g/r, equal pulses throughout Respiratory: CTABL, no w/r/r, normal WOB GI: BS+, NDNT, no masses noted, no organomegaly noted MSK: No e/c/c Neuro: A&O x 3, no focal deficits Psyc: Appropriate interaction and affect, calm/cooperative   The results of significant diagnostics from this hospitalization (including imaging, microbiology, ancillary and laboratory) are listed below for reference.     Microbiology: Recent Results (from the past 240 hour(s))  Surgical pcr screen     Status: None   Collection Time: 09/18/19  5:26 PM   Specimen: Nasal Mucosa; Nasal Swab  Result Value Ref Range Status   MRSA, PCR NEGATIVE NEGATIVE Final   Staphylococcus aureus NEGATIVE NEGATIVE Final    Comment:  (NOTE) The Xpert SA Assay (FDA approved for NASAL specimens in patients 54 years of age and older), is one component of a comprehensive surveillance program. It is not intended to diagnose infection nor to guide or monitor treatment. Performed at [facility], [place] [address] [place], [place] [address]   Aerobic/Anaerobic Culture (surgical/deep wound)     Status: None   Collection Time: 09/19/19  8:55 AM   Specimen: PATH Cytology Misc. fluid; Body Fluid  Result Value Ref Range Status   Specimen Description TISSUE LEFT HIP  Final   Special Requests A  Final   Gram Stain   Final    ABUNDANT WBC PRESENT, PREDOMINANTLY PMN NO ORGANISMS SEEN    Culture   Final    No growth aerobically or anaerobically. Performed at [facility], [place] [address] [place], [place] [address]    Report Status 09/24/2019 FINAL  Final  Aerobic/Anaerobic Culture (surgical/deep wound)     Status: None   Collection Time: 09/19/19 10:31 AM   Specimen: Soft Tissue, Other  Result Value Ref Range Status   Specimen Description FLUID LEFT HIP  Final   Special Requests NONE  Final   Gram Stain   Final    RARE WBC PRESENT, PREDOMINANTLY PMN NO ORGANISMS SEEN    Culture   Final    No growth aerobically or anaerobically. Performed at [facility], [place] [address] [place], [place] [address]    Report Status 09/24/2019 FINAL  Final     Labs: BNP (last 3 results) Recent Labs    09/12/19 0744  BNP 03.0   Basic Metabolic Panel: Recent Labs  Lab 09/21/19 0835 09/22/19 0336 09/23/19 0240 09/24/19 0445 09/25/19 0345  NA 135 136 137 137 137  K 3.9 3.9 3.9 4.1 3.6  CL 102 103 103 102 102  CO2 _0 GLUCOSE 150* 111* 121* 102* 100*  BUN 23* _1 CREATININE 0.82 0.84 0.88 0.83 0.84  CALCIUM 7.9* 7.8* 7.9* 7.9* 7.9*  MG 1.9 1.9 1.8 1.9 1.8   Liver Function Tests: Recent Labs  Lab 09/19/19 2124 09/20/19 0307 09/23/19 0240 09/24/19 0445 09/25/19 0345  AST 47* 53*  62* 52* 44*  ALT _2 ALKPHOS 81 86 87 87 82  BILITOT 1.3* 1.1 1.1 1.0 0.9  PROT 5.1* 5.4* 5.3* 5.2* 5.3*  ALBUMIN 2.0* 2.1* 2.0* 1.9* 1.9*   No results for input(s): LIPASE, AMYLASE in the last 168 hours. No results for input(s): AMMONIA in the last 168 hours. CBC: Recent Labs  Lab 09/21/19 0835 09/21/19 0835 09/21/19 2324 09/22/19 0336 09/23/19 0240 09/24/19 0445 09/25/19 0345  WBC 4.7  --   --  4.1 4.2 4.0 3.8*  NEUTROABS  --   --   --  2.0 2.1 2.0 1.8  HGB 5.8*   < > 7.1* 7.1* 7.1* 7.3* 7.2*  HCT 19.0*   < > 22.7* 22.5* 22.8* 23.1* 23.4*  MCV 81.5  --   --  82.4 82.9 81.9 82.7  PLT 95*  --   --  94* 93* 94* 91*   < > = values in this interval not displayed.   Cardiac Enzymes: No results for input(s): CKTOTAL, CKMB, CKMBINDEX, TROPONINI in the last 168 hours. BNP: Invalid input(s): POCBNP CBG: Recent Labs  Lab 09/24/19 1152 09/24/19 1638 09/24/19 2007 09/25/19 0503 09/25/19 0627  GLUCAP 133* 140* 129* 91 95   D-Dimer No results for input(s): DDIMER in the last 72 hours. Hgb A1c No results for input(s): HGBA1C in the last 72 hours. Lipid Profile No results for input(s): CHOL, HDL, LDLCALC, TRIG, CHOLHDL, LDLDIRECT in the last 72 hours. Thyroid function studies No results for input(s): TSH, T4TOTAL, T3FREE, THYROIDAB in the last 72 hours.  Invalid input(s): FREET3 Anemia work up No results for input(s): VITAMINB12, FOLATE, FERRITIN, TIBC, IRON, RETICCTPCT in the last 72 hours. Urinalysis    Component Value Date/Time   COLORURINE AMBER (A) 09/12/2019 1057   APPEARANCEUR HAZY (A) 09/12/2019 1057   LABSPEC 1.030 09/12/2019 1057   PHURINE 5.0 09/12/2019 1057   GLUCOSEU NEGATIVE 09/12/2019 1057   HGBUR NEGATIVE 09/12/2019 1057   BILIRUBINUR NEGATIVE 09/12/2019 1057   KETONESUR NEGATIVE 09/12/2019 1057   PROTEINUR 30 (A) 09/12/2019 1057   NITRITE NEGATIVE 09/12/2019 1057   LEUKOCYTESUR NEGATIVE 09/12/2019 1057   Sepsis Labs Invalid input(s):  PROCALCITONIN,  WBC,  LACTICIDVEN Microbiology Recent Results (from the past 240 hour(s))  Surgical pcr screen     Status: None   Collection Time: 09/18/19  5:26 PM   Specimen: Nasal Mucosa; Nasal Swab  Result Value Ref Range Status   MRSA, PCR NEGATIVE NEGATIVE Final   Staphylococcus aureus NEGATIVE NEGATIVE Final    Comment: (NOTE) The Xpert SA Assay (FDA approved for NASAL specimens in patients 76 years of age and older), is  one component of a comprehensive surveillance program. It is not intended to diagnose infection nor to guide or monitor treatment. Performed at [facility], [place] [address], [place], [place] [address]   Aerobic/Anaerobic Culture (surgical/deep wound)     Status: None   Collection Time: 09/19/19  8:55 AM   Specimen: PATH Cytology Misc. fluid; Body Fluid  Result Value Ref Range Status   Specimen Description TISSUE LEFT HIP  Final   Special Requests A  Final   Gram Stain   Final    ABUNDANT WBC PRESENT, PREDOMINANTLY PMN NO ORGANISMS SEEN    Culture   Final    No growth aerobically or anaerobically. Performed at [facility], [place] [address], [place], [place] [address]    Report Status 09/24/2019 FINAL  Final  Aerobic/Anaerobic Culture (surgical/deep wound)     Status: None   Collection Time: 09/19/19 10:31 AM   Specimen: Soft Tissue, Other  Result Value Ref Range Status   Specimen Description FLUID LEFT HIP  Final   Special Requests NONE  Final   Gram Stain   Final    RARE WBC PRESENT, PREDOMINANTLY PMN NO ORGANISMS SEEN    Culture   Final    No growth aerobically or anaerobically. Performed at [facility], [place] [address] [place], [place] [address]    Report Status 09/24/2019 FINAL  Final     Time coordinating discharge: 35 minutes  SIGNED:   Jonnie Finner, DO  [facility] 09/25/2019, 7:08 AM   If 7PM-7AM, please contact night-coverage [email or web address]

## 2019-09-25 NOTE — Progress Notes (Signed)
Feeling well some soreness over thigh.  VSS afebrile dressing in place.   A/P Will discharge this morning . Dressing change prior to discharge. Patient is amenable to IV antibiotics as discussed.

## 2019-09-29 ENCOUNTER — Other Ambulatory Visit (HOSPITAL_COMMUNITY)
Admission: RE | Admit: 2019-09-29 | Discharge: 2019-09-29 | Disposition: A | Discharge status: Home / Self Care | Payer: Self-pay | Source: Other Acute Inpatient Hospital | Attending: Family Medicine | Admitting: Family Medicine

## 2019-09-29 DIAGNOSIS — R7881 Bacteremia: Secondary | ICD-10-CM | POA: Insufficient documentation

## 2019-09-29 DIAGNOSIS — A419 Sepsis, unspecified organism: Secondary | ICD-10-CM | POA: Insufficient documentation

## 2019-09-29 LAB — CBC WITH DIFFERENTIAL/PLATELET
Abs Immature Granulocytes: 0.02 10*3/uL (ref 0.00–0.07)
Basophils Absolute: 0 10*3/uL (ref 0.0–0.1)
Basophils Relative: 1 %
Eosinophils Absolute: 0.4 10*3/uL (ref 0.0–0.5)
Eosinophils Relative: 8 %
HCT: 23.6 % — ABNORMAL LOW (ref 39.0–52.0)
Hemoglobin: 7.1 g/dL — ABNORMAL LOW (ref 13.0–17.0)
Immature Granulocytes: 0 %
Lymphocytes Relative: 22 %
Lymphs Abs: 1.1 10*3/uL (ref 0.7–4.0)
MCH: 25.2 pg — ABNORMAL LOW (ref 26.0–34.0)
MCHC: 30.1 g/dL (ref 30.0–36.0)
MCV: 83.7 fL (ref 80.0–100.0)
Monocytes Absolute: 0.5 10*3/uL (ref 0.1–1.0)
Monocytes Relative: 11 %
Neutro Abs: 2.9 10*3/uL (ref 1.7–7.7)
Neutrophils Relative %: 58 %
Platelets: 109 10*3/uL — ABNORMAL LOW (ref 150–400)
RBC: 2.82 MIL/uL — ABNORMAL LOW (ref 4.22–5.81)
RDW: 18.5 % — ABNORMAL HIGH (ref 11.5–15.5)
WBC: 4.9 10*3/uL (ref 4.0–10.5)
nRBC: 0 % (ref 0.0–0.2)

## 2019-09-29 LAB — BASIC METABOLIC PANEL
Anion gap: 7 (ref 5–15)
BUN: 14 mg/dL (ref 6–20)
CO2: 24 mmol/L (ref 22–32)
Calcium: 7.8 mg/dL — ABNORMAL LOW (ref 8.9–10.3)
Chloride: 107 mmol/L (ref 98–111)
Creatinine, Ser: 0.72 mg/dL (ref 0.61–1.24)
GFR calc Af Amer: >60 mL/min (ref >60)
GFR calc non Af Amer: >60 mL/min (ref >60)
Glucose, Bld: 118 mg/dL — ABNORMAL HIGH (ref 70–99)
Potassium: 3.9 mmol/L (ref 3.5–5.1)
Sodium: 138 mmol/L (ref 135–145)

## 2019-10-01 ENCOUNTER — Ambulatory Visit: Payer: Self-pay | Admitting: Physician Assistant

## 2019-10-02 ENCOUNTER — Ambulatory Visit (INDEPENDENT_AMBULATORY_CARE_PROVIDER_SITE_OTHER): Payer: Self-pay | Admitting: Physician Assistant

## 2019-10-02 ENCOUNTER — Encounter: Payer: Self-pay | Admitting: Physician Assistant

## 2019-10-02 ENCOUNTER — Inpatient Hospital Stay: Payer: Self-pay | Admitting: Physician Assistant

## 2019-10-02 VITALS — Ht 74.0 in | Wt 327.0 lb

## 2019-10-02 DIAGNOSIS — L0231 Cutaneous abscess of buttock: Secondary | ICD-10-CM

## 2019-10-03 ENCOUNTER — Encounter: Payer: Self-pay | Admitting: Physician Assistant

## 2019-10-03 NOTE — Progress Notes (Signed)
Office Visit Note   Patient: Robert Sullivan           Date of Birth: 05/06/1966           MRN: [id number] Visit Date: 10/02/2019              Requested by: Cathleen Corti, PA-C [place],  [place] [address] PCP: Cathleen Corti, PA-C  Chief Complaint  Patient presents with  . Left Hip - Routine Post Op    09/19/19 debridement left hip       HPI: Patient is 13 days s/p left hip debridement. Family has been doing dressing changes 2x daily. He is having a lot of clotted blood coming through the incision and it is dehiscing.   Assessment & Plan: Visit Diagnoses: No diagnosis found.  Plan: Patient will follow up with Dr. Sharol Given on Tuesday. May likely need wound revision which could be done on Wednsday.Encouraged continued protein supplementation  Follow-Up Instructions: No follow-ups on file.   Ortho Exam  Patient is alert, oriented, no adenopathy, well-dressed, normal affect, normal respiratory effort. Wound: No cellulitis Wound edges no necrosis but clearly no evidence of healing . No foul odor or cellulitis. Proximal end of wound has dehisced . Moderate amount of clotted blood was expressed throught the wound. Wound was lightly packed, pressure dressing applied  Imaging: No results found.   Labs: Lab Results  Component Value Date   HGBA1C 4.6 (L) 09/12/2019   HGBA1C 5.5 07/10/2019   HGBA1C 11.0 (H) 05/13/2019   ESRSEDRATE 15 09/12/2019   ESRSEDRATE 12 07/11/2019   CRP 1.9 (H) 09/25/2019   CRP 1.8 (H) 09/24/2019   CRP 1.9 (H) 09/23/2019   REPTSTATUS 09/24/2019 FINAL 09/19/2019   GRAMSTAIN  09/19/2019    RARE WBC PRESENT, PREDOMINANTLY PMN NO ORGANISMS SEEN    CULT  09/19/2019    No growth aerobically or anaerobically. Performed at [facility], [place] [address] [place], [place] [address]    LABORGA ENTEROCOCCUS FAECALIS 09/13/2019     Lab Results  Component Value Date   ALBUMIN 1.9 (L) 09/25/2019   ALBUMIN 1.9 (L) 09/24/2019   ALBUMIN  2.0 (L) 09/23/2019    Lab Results  Component Value Date   MG 1.8 09/25/2019   MG 1.9 09/24/2019   MG 1.8 09/23/2019   No results found for: VD25OH  No results found for: PREALBUMIN CBC EXTENDED Latest Ref Rng & Units 09/29/2019 09/25/2019 09/24/2019  WBC 4.0 - 10.5 K/uL 4.9 3.8(L) 4.0  RBC 4.22 - 5.81 MIL/uL 2.82(L) 2.83(L) 2.82(L)  HGB 13.0 - 17.0 g/dL 7.1(L) 7.2(L) 7.3(L)  HCT 39.0 - 52.0 % 23.6(L) 23.4(L) 23.1(L)  PLT 150 - 400 K/uL 109(L) 91(L) 94(L)  NEUTROABS 1.7 - 7.7 K/uL 2.9 1.8 2.0  LYMPHSABS 0.7 - 4.0 K/uL 1.1 1.3 1.2     Body mass index is 41.98 kg/m.  Orders:  No orders of the defined types were placed in this encounter.  No orders of the defined types were placed in this encounter.    Procedures: No procedures performed  Clinical Data: No additional findings.  ROS:  All other systems negative, except as noted in the HPI. Review of Systems  Objective: Vital Signs: Ht 6' 2"  (1.88 m)   Wt (!) 327 lb (148.3 kg)   BMI 41.98 kg/m   Specialty Comments:  No specialty comments available.  PMFS History: Patient Active Problem List   Diagnosis Date Noted  . Ascites   .  Anasarca 09/12/2019  . Cirrhosis of liver with ascites ([place]) 09/12/2019  . Abscess, gluteal, left 09/12/2019  . Pleural effusion 09/12/2019  . Thrombocytopenia ([place]) 09/12/2019  . Blister of hip with infection 07/09/2019  . Abscess of left hip   . MSSA bacteremia 06/19/2019  . Uncontrolled diabetes mellitus ([place]) 06/10/2019  . Obesity, Class III, BMI 40-49.9 (morbid obesity) ([place]) 06/10/2019  . Cellulitis, gluteal, left 06/10/2019  . Sepsis secondary to UTI ([place]) 05/13/2019  . Acute lower UTI 05/13/2019  . Type 2 diabetes mellitus without complication ([place]) 81/19/1478  . Hip pain, acute, left 05/13/2019  . Sepsis ([place]) 05/13/2019  . Acute cystitis without hematuria   . Hyperglycemia    Past Medical History:  Diagnosis Date  . Anemia   . Anxiety   . Arthritis 05/13/2019    knees  . Depression   . Diabetes mellitus without complication ([place])   . DVT (deep venous thrombosis) ([place])   . Gallstones   . Headache   . HTN (hypertension)   . Hyperlipidemia   . Sleep apnea     Family History  Problem Relation Age of Onset  . Hypertension Father     Past Surgical History:  Procedure Laterality Date  . I & D EXTREMITY Left 07/16/2019   Procedure: LEFT HIP DEBRIDEMENT;  Surgeon: Newt Minion, MD;  Location: [facility];  Service: Orthopedics;  Laterality: Left;  . I & D EXTREMITY Left 07/18/2019   Procedure: REPEAT IRRIGATION AND DEBRIDEMENT LEFT HIP;  Surgeon: Newt Minion, MD;  Location: [facility];  Service: Orthopedics;  Laterality: Left;  . I & D EXTREMITY Left 09/19/2019   Procedure: DEBRIDEMENT LEFT HIP [place] OF WOUND VAC;  Surgeon: Newt Minion, MD;  Location: [facility];  Service: Orthopedics;  Laterality: Left;  . INCISION AND DRAINAGE ABSCESS Left 06/23/2019   Procedure: INCISION AND DRAINAGE GLUTEAL ABSCESS;  Surgeon: Renette Butters, MD;  Location: [facility];  Service: Orthopedics;  Laterality: Left;  . INCISION AND DRAINAGE ABSCESS Left 07/09/2019   Procedure: INCISION AND DRAINAGE LEFT HIP/PELVIC ABSCESS, APPLICATION OF NEGATIVE PRESSURE WOUND VAC;  Surgeon: Newt Minion, MD;  Location: [facility];  Service: Orthopedics;  Laterality: Left;  . INCISION AND DRAINAGE HIP Left 06/25/2019   Procedure: IRRIGATION AND DEBRIDEMENT HIP;  Surgeon: Shona Needles, MD;  Location: [facility];  Service: Orthopedics;  Laterality: Left;  . INCISION AND DRAINAGE OF WOUND Left 07/12/2019   Procedure: LEFT HIP DEBRIDEMENT;  Surgeon: Newt Minion, MD;  Location: [facility];  Service: Orthopedics;  Laterality: Left;  . IR PARACENTESIS  09/13/2019  . IR US GUIDE BX ASP/DRAIN  06/17/2019  . IR US GUIDE BX ASP/DRAIN  09/13/2019  . KNEE ARTHROSCOPY Bilateral    left x 2, 1 right  . LAPAROSCOPIC CHOLECYSTECTOMY  ~ 2010  . LUMBAR DISC SURGERY    . NASAL SINUS SURGERY    . TEE WITHOUT CARDIOVERSION  N/A 06/16/2019   Procedure: TRANSESOPHAGEAL ECHOCARDIOGRAM (TEE);  Surgeon: Dorothy Spark, MD;  Location: [facility] ENDOSCOPY;  Service: Cardiovascular;  Laterality: N/A;   Social History   Occupational History  . Occupation: Logistics  Tobacco Use  . Smoking status: Former Smoker    Types: Cigars    Quit date: 09/10/2017    Years since quitting: 2.0  . Smokeless tobacco: Never Used  Substance and Sexual Activity  . Alcohol use: Not Currently  . Drug use: Not Currently  . Sexual activity: Not on file

## 2019-10-06 ENCOUNTER — Encounter: Payer: Self-pay | Admitting: *Deleted

## 2019-10-06 ENCOUNTER — Other Ambulatory Visit (HOSPITAL_COMMUNITY)
Admission: RE | Admit: 2019-10-06 | Discharge: 2019-10-06 | Disposition: A | Discharge status: Home / Self Care | Payer: Self-pay | Source: Other Acute Inpatient Hospital | Attending: Family Medicine | Admitting: Family Medicine

## 2019-10-06 DIAGNOSIS — R7881 Bacteremia: Secondary | ICD-10-CM | POA: Insufficient documentation

## 2019-10-06 DIAGNOSIS — A419 Sepsis, unspecified organism: Secondary | ICD-10-CM | POA: Insufficient documentation

## 2019-10-06 LAB — BASIC METABOLIC PANEL
Anion gap: 7 (ref 5–15)
BUN: 15 mg/dL (ref 6–20)
CO2: 25 mmol/L (ref 22–32)
Calcium: 7.9 mg/dL — ABNORMAL LOW (ref 8.9–10.3)
Chloride: 104 mmol/L (ref 98–111)
Creatinine, Ser: 0.7 mg/dL (ref 0.61–1.24)
GFR calc Af Amer: >60 mL/min (ref >60)
GFR calc non Af Amer: >60 mL/min (ref >60)
Glucose, Bld: 113 mg/dL — ABNORMAL HIGH (ref 70–99)
Potassium: 3.5 mmol/L (ref 3.5–5.1)
Sodium: 136 mmol/L (ref 135–145)

## 2019-10-06 LAB — CBC WITH DIFFERENTIAL/PLATELET
Abs Immature Granulocytes: 0.01 10*3/uL (ref 0.00–0.07)
Basophils Absolute: 0.1 10*3/uL (ref 0.0–0.1)
Basophils Relative: 1 %
Eosinophils Absolute: 0.3 10*3/uL (ref 0.0–0.5)
Eosinophils Relative: 5 %
HCT: 27.2 % — ABNORMAL LOW (ref 39.0–52.0)
Hemoglobin: 8.4 g/dL — ABNORMAL LOW (ref 13.0–17.0)
Immature Granulocytes: 0 %
Lymphocytes Relative: 16 %
Lymphs Abs: 0.8 10*3/uL (ref 0.7–4.0)
MCH: 26.4 pg (ref 26.0–34.0)
MCHC: 30.9 g/dL (ref 30.0–36.0)
MCV: 85.5 fL (ref 80.0–100.0)
Monocytes Absolute: 0.5 10*3/uL (ref 0.1–1.0)
Monocytes Relative: 10 %
Neutro Abs: 3.3 10*3/uL (ref 1.7–7.7)
Neutrophils Relative %: 68 %
Platelets: 104 10*3/uL — ABNORMAL LOW (ref 150–400)
RBC: 3.18 MIL/uL — ABNORMAL LOW (ref 4.22–5.81)
RDW: 20 % — ABNORMAL HIGH (ref 11.5–15.5)
WBC: 4.9 10*3/uL (ref 4.0–10.5)
nRBC: 0 % (ref 0.0–0.2)

## 2019-10-06 LAB — SEDIMENTATION RATE: Sed Rate: 12 mm/hr (ref 0–16)

## 2019-10-06 LAB — C-REACTIVE PROTEIN: CRP: 2.8 mg/dL — ABNORMAL HIGH (ref <1.0)

## 2019-10-07 ENCOUNTER — Encounter: Payer: Self-pay | Admitting: Orthopedic Surgery

## 2019-10-07 ENCOUNTER — Other Ambulatory Visit: Payer: Self-pay | Admitting: Physician Assistant

## 2019-10-07 ENCOUNTER — Other Ambulatory Visit: Payer: Self-pay

## 2019-10-07 ENCOUNTER — Encounter (HOSPITAL_COMMUNITY): Payer: Self-pay | Admitting: Orthopedic Surgery

## 2019-10-07 ENCOUNTER — Ambulatory Visit (INDEPENDENT_AMBULATORY_CARE_PROVIDER_SITE_OTHER): Payer: Self-pay | Admitting: Physician Assistant

## 2019-10-07 ENCOUNTER — Ambulatory Visit: Payer: Self-pay | Admitting: Physician Assistant

## 2019-10-07 DIAGNOSIS — T8130XA Disruption of wound, unspecified, initial encounter: Secondary | ICD-10-CM

## 2019-10-07 NOTE — Anesthesia Preprocedure Evaluation (Addendum)
Anesthesia Evaluation  Patient identified by MRN, date of birth, ID band Patient awake    Reviewed: Allergy & Precautions, NPO status , Patient's Chart, lab work & pertinent test results  Airway Mallampati: II  TM Distance: >3 FB Neck ROM: Full    Dental  (+) Dental Advisory Given   Pulmonary sleep apnea , former smoker,    breath sounds clear to auscultation       Cardiovascular hypertension, Pt. on medications  Rhythm:Regular Rate:Normal     Neuro/Psych  Headaches,    GI/Hepatic negative GI ROS, Neg liver ROS,   Endo/Other  diabetes, Type 2, Insulin DependentMorbid obesity  Renal/GU negative Renal ROS     Musculoskeletal  (+) Arthritis ,   Abdominal   Peds  Hematology  (+) anemia ,   Anesthesia Other Findings   Reproductive/Obstetrics                             Lab Results  Component Value Date   WBC 4.9 10/06/2019   HGB 8.4 (L) 10/06/2019   HCT 27.2 (L) 10/06/2019   MCV 85.5 10/06/2019   PLT 104 (L) 10/06/2019   Lab Results  Component Value Date   CREATININE 0.70 10/06/2019   BUN 15 10/06/2019   NA 136 10/06/2019   K 3.5 10/06/2019   CL 104 10/06/2019   CO2 25 10/06/2019    Anesthesia Physical Anesthesia Plan  ASA: III  Anesthesia Plan: General   Post-op Pain Management:    Induction: Intravenous  PONV Risk Score and Plan: 2 and Dexamethasone, Ondansetron and Treatment may vary due to age or medical condition  Airway Management Planned: Oral ETT and LMA  Additional Equipment:   Intra-op Plan:   Post-operative Plan: Extubation in OR  Informed Consent: I have reviewed the patients History and Physical, chart, labs and discussed the procedure including the risks, benefits and alternatives for the proposed anesthesia with the patient or authorized representative who has indicated his/her understanding and acceptance.     Dental advisory given  Plan  Discussed with: CRNA  Anesthesia Plan Comments: (   )       Anesthesia Quick Evaluation

## 2019-10-07 NOTE — Progress Notes (Signed)
Office Visit Note   Patient: Robert Sullivan           Date of Birth: 1966/04/14           MRN: [id number] Visit Date: 10/07/2019              Requested by: Cathleen Corti, PA-C [place],  [place] [address] PCP: Cathleen Corti, PA-C  No chief complaint on file.     HPI: This is a pleasant 54 year old gentleman who is now 2-1/2 weeks status post repeat irrigation and debridement of his left hip.  His wife has been doing dressing changes at home for him.  He presents today for follow-up.  He still continues to have moderate drainage.  His most recent hemoglobin is 8.4  Assessment & Plan: Visit Diagnoses: No diagnosis found.  Plan: Patient was seen today by Dr. Sharol Given.  Patient had an extensive discussion with Dr. Sharol Given as well as with his wife.  Review of his progress up to this point was extensively discussed.  We have recommended repeat irrigation and debridement and closure of his wound.  We will do repeat cultures.  If the cultures are negative we would engage infectious disease further recommendations he understands that he will be admitted to the hospital for period of 5 days to allow for the cultures to finalize.  Reviewed the risks of surgery which include but are not limited to bleeding infection need for future surgery  Follow-Up Instructions: No follow-ups on file.   Ortho Exam  Patient is alert, oriented, no adenopathy, well-dressed, normal affect, normal respiratory effort Focused examination of the wound demonstrates wound breakdown and dehiscence in the upper third of the wound.  There is no foul odor some serosanguineous drainage.  The wound probes deep.  Mild surrounding irritation in the skin.  No necrosis Lungs Clear Heart RRR Imaging: No results found. No images are attached to the encounter.  Labs: Lab Results  Component Value Date   HGBA1C 4.6 (L) 09/12/2019   HGBA1C 5.5 07/10/2019   HGBA1C 11.0 (H) 05/13/2019   ESRSEDRATE 12 10/06/2019   ESRSEDRATE 15 09/12/2019   ESRSEDRATE 12 07/11/2019   CRP 2.8 (H) 10/06/2019   CRP 1.9 (H) 09/25/2019   CRP 1.8 (H) 09/24/2019   REPTSTATUS 09/24/2019 FINAL 09/19/2019   GRAMSTAIN  09/19/2019    RARE WBC PRESENT, PREDOMINANTLY PMN NO ORGANISMS SEEN    CULT  09/19/2019    No growth aerobically or anaerobically. Performed at [facility], [place] [address], [place], [place] [address]    LABORGA ENTEROCOCCUS FAECALIS 09/13/2019     Lab Results  Component Value Date   ALBUMIN 1.9 (L) 09/25/2019   ALBUMIN 1.9 (L) 09/24/2019   ALBUMIN 2.0 (L) 09/23/2019    Lab Results  Component Value Date   MG 1.8 09/25/2019   MG 1.9 09/24/2019   MG 1.8 09/23/2019   No results found for: VD25OH  No results found for: PREALBUMIN CBC EXTENDED Latest Ref Rng & Units 10/06/2019 09/29/2019 09/25/2019  WBC 4.0 - 10.5 K/uL 4.9 4.9 3.8(L)  RBC 4.22 - 5.81 MIL/uL 3.18(L) 2.82(L) 2.83(L)  HGB 13.0 - 17.0 g/dL 8.4(L) 7.1(L) 7.2(L)  HCT 39.0 - 52.0 % 27.2(L) 23.6(L) 23.4(L)  PLT 150 - 400 K/uL 104(L) 109(L) 91(L)  NEUTROABS 1.7 - 7.7 K/uL 3.3 2.9 1.8  LYMPHSABS 0.7 - 4.0 K/uL 0.8 1.1 1.3     There is no height or weight on file to calculate BMI.  Orders:  No orders of the defined types were placed in this encounter.  No orders of the defined types were placed in this encounter.    Procedures: No procedures performed  Clinical Data: No additional findings.  ROS:  All other systems negative, except as noted in the HPI. Review of Systems  Objective: Vital Signs: There were no vitals taken for this visit.  Specialty Comments:  No specialty comments available.  PMFS History: Patient Active Problem List   Diagnosis Date Noted  . Ascites   . Anasarca 09/12/2019  . Cirrhosis of liver with ascites ([place]) 09/12/2019  . Abscess, gluteal, left 09/12/2019  . Pleural effusion 09/12/2019  . Thrombocytopenia ([place]) 09/12/2019  . Blister of hip with infection 07/09/2019  . Abscess of left  hip   . MSSA bacteremia 06/19/2019  . Uncontrolled diabetes mellitus ([place]) 06/10/2019  . Obesity, Class III, BMI 40-49.9 (morbid obesity) ([place]) 06/10/2019  . Cellulitis, gluteal, left 06/10/2019  . Sepsis secondary to UTI ([place]) 05/13/2019  . Acute lower UTI 05/13/2019  . Type 2 diabetes mellitus without complication ([place]) 55/97/4163  . Hip pain, acute, left 05/13/2019  . Sepsis ([place]) 05/13/2019  . Acute cystitis without hematuria   . Hyperglycemia    Past Medical History:  Diagnosis Date  . Anemia   . Anxiety   . Arthritis 05/13/2019   knees  . Cirrhosis ([place])   . Depression   . Diabetes mellitus without complication ([place])   . DVT (deep venous thrombosis) ([place])   . Gallstones   . Gluteal abscess   . Headache   . HTN (hypertension)   . Hyperlipidemia   . Sleep apnea   . Splenomegaly     Family History  Problem Relation Age of Onset  . Hypertension Father     Past Surgical History:  Procedure Laterality Date  . I & D EXTREMITY Left 07/16/2019   Procedure: LEFT HIP DEBRIDEMENT;  Surgeon: Newt Minion, MD;  Location: [facility];  Service: Orthopedics;  Laterality: Left;  . I & D EXTREMITY Left 07/18/2019   Procedure: REPEAT IRRIGATION AND DEBRIDEMENT LEFT HIP;  Surgeon: Newt Minion, MD;  Location: [facility];  Service: Orthopedics;  Laterality: Left;  . I & D EXTREMITY Left 09/19/2019   Procedure: DEBRIDEMENT LEFT HIP Appilcattion OF WOUND VAC;  Surgeon: Newt Minion, MD;  Location: [facility];  Service: Orthopedics;  Laterality: Left;  . INCISION AND DRAINAGE ABSCESS Left 06/23/2019   Procedure: INCISION AND DRAINAGE GLUTEAL ABSCESS;  Surgeon: Renette Butters, MD;  Location: [facility];  Service: Orthopedics;  Laterality: Left;  . INCISION AND DRAINAGE ABSCESS Left 07/09/2019   Procedure: INCISION AND DRAINAGE LEFT HIP/PELVIC ABSCESS, APPLICATION OF NEGATIVE PRESSURE WOUND VAC;  Surgeon: Newt Minion, MD;  Location: [facility];  Service: Orthopedics;  Laterality: Left;  . INCISION AND  DRAINAGE HIP Left 06/25/2019   Procedure: IRRIGATION AND DEBRIDEMENT HIP;  Surgeon: Shona Needles, MD;  Location: [facility];  Service: Orthopedics;  Laterality: Left;  . INCISION AND DRAINAGE OF WOUND Left 07/12/2019   Procedure: LEFT HIP DEBRIDEMENT;  Surgeon: Newt Minion, MD;  Location: [facility];  Service: Orthopedics;  Laterality: Left;  . IR PARACENTESIS  09/13/2019  . IR US GUIDE BX ASP/DRAIN  06/17/2019  . IR US GUIDE BX ASP/DRAIN  09/13/2019  . KNEE ARTHROSCOPY Bilateral    left x 2, 1 right  . LAPAROSCOPIC CHOLECYSTECTOMY  ~ 2010  . LUMBAR DISC SURGERY    .  NASAL SINUS SURGERY    . TEE WITHOUT CARDIOVERSION N/A 06/16/2019   Procedure: TRANSESOPHAGEAL ECHOCARDIOGRAM (TEE);  Surgeon: Dorothy Spark, MD;  Location: [facility] ENDOSCOPY;  Service: Cardiovascular;  Laterality: N/A;   Social History   Occupational History  . Occupation: Logistics  Tobacco Use  . Smoking status: Former Smoker    Types: Cigars    Quit date: 09/10/2017    Years since quitting: 2.0  . Smokeless tobacco: Never Used  Substance and Sexual Activity  . Alcohol use: Not Currently  . Drug use: Not Currently  . Sexual activity: Not on file

## 2019-10-07 NOTE — Progress Notes (Signed)
Spoke with pt for pre-op call. Pt denies cardiac history or HTN. Pt is a type 2 diabetic. Last A1C was 4.6 on 09/12/19. Pt states his diabetes meds were stopped when he was recently in the hospital. He only takes his Lantus if his blood sugar is greater than 120.   Pt instructed to check his blood sugar when he gets up and every 2 hours until he leaves for the hospital. Instructed him if his blood sugar is over 120 where he would take his Lantus, I instructed him to use 1/2 of that dose in the AM. If blood sugar is 70 or below, treat with 1/2 cup of clear juice (apple or cranberry) and recheck blood sugar 15 minutes after drinking juice. If blood sugar continues to be 70 or below, call the Short Stay department and ask to speak to a nurse. Pt voiced understanding.  Instructed pt not to eat food after midnight, but may have clear liquids until 11:30 AM.   Pt will arrive at 11:30 AM to have a Covid test done on arrival.

## 2019-10-07 NOTE — Progress Notes (Signed)
Anesthesia Chart Review: Robert Sullivan    Case: [id number] Date/Time: 10/08/19 1417   Procedure: IRRIGATION AND DEBRIDEMENT, REVISION LEFT HIP WOUND (Left )   Anesthesia type: Choice   Pre-op diagnosis: Left Hip Wound Dehiscence   Location: [facility] OR ROOM 03 / [facility] OR   Surgeons: Newt Minion, MD      DISCUSSION: Patient is a 54 year old male scheduled for the above procedure.  He fell in the bathtub in early November 2020. Left hip CT showed no acute abnormality. Two days later he was admitted with urosepsis and left gluteal hematoma by MRI. Readmission 06/2019 with MSSA bacteremia, no evidence of vegetation on TEE. He required I&D of left gluteal abscess on 06/23/19 and has since undergone at least six other I&D left hip, last on 3/84/53 also with application of wound VAC during 09/11/19-09/25/19 hospitalization for anasarca in setting of decompensated liver cirrhosis and recurrent gluteal abscess. Seen on 09/10/19 for orthopedic follow-up and still with moderate left gluteal wound drainage so 10/08/19 I&D recommended.   Other history includes former smoker (quit 09/10/17), HTN, HLD, DM2, DVT (timing unclear; negative BLE DVT 09/13/19), cirrhosis (NASH suspected; s/p paracentesis 09/13/19), splenomegaly, OSA (does not use CPAP), anemia, anxiety, depression, obesity.  As of 10/06/19, H/H 8.4/27.2 (up from 7.1/23.6). PLT 104, but consistent with previous results (PLT count variable, ranging from 78K-193K, but most recently ~ 90-110K).  He will need updated labs on arrival. GI is following cirrhosis with additional out-patient work-up planned once infection resolves.  He is a SAME DAY WORK-UP, so he will get labs and anesthesia team evaluate on the day of surgery. Definitive plan at that time. It appears that he will need COVID-19 test on the day of surgery as well.    VS:   Wt Readings from Last 3 Encounters:  10/02/19 (!) 148.3 kg  09/25/19 (!) 148.6 kg  09/04/19 (!) 167.8 kg   BP Readings from Last 3  Encounters:  09/25/19 120/61  09/11/19 (!) 146/76  07/25/19 131/75   Pulse Readings from Last 3 Encounters:  09/25/19 88  09/11/19 (!) 104  07/25/19 88    PROVIDERS: Brantley Stage A, PA-C is listed as PCP - GI is with [facility]. Seen by Ellouise Newer, PA-C/[place], Valarie Merino, MD on 09/11/19. NASH cirrhosis suspected. Out-patient Hep B vaccination, EGD for variceal screening and colonoscopy for CRC screening recommended once gluteal injection resolved.  Carlyle Basques, MD is ID   LABS: Last lab results include: Lab Results  Component Value Date   WBC 4.9 10/06/2019   HGB 8.4 (L) 10/06/2019   HCT 27.2 (L) 10/06/2019   PLT 104 (L) 10/06/2019   GLUCOSE 113 (H) 10/06/2019   ALT 15 09/25/2019   AST 44 (H) 09/25/2019   NA 136 10/06/2019   K 3.5 10/06/2019   CL 104 10/06/2019   CREATININE 0.70 10/06/2019   BUN 15 10/06/2019   CO2 25 10/06/2019   INR 1.6 (H) 09/14/2019   HGBA1C 4.6 (L) 09/12/2019   CBC Latest Ref Rng & Units 10/06/2019 09/29/2019 09/25/2019  WBC 4.0 - 10.5 K/uL 4.9 4.9 3.8(L)  Hemoglobin 13.0 - 17.0 g/dL 8.4(L) 7.1(L) 7.2(L)  Hematocrit 39.0 - 52.0 % 27.2(L) 23.6(L) 23.4(L)  Platelets 150 - 400 K/uL 104(L) 109(L) 91(L)     IMAGES: MRI Left hip 09/13/19: IMPRESSION: 1. 19 x 5.5 cm thick walled abscess in the left gluteus maximus muscle laterally. 2. Significant surrounding myofasciitis. 3. No findings for septic arthritis involving the left hip or left  hemipelvis. 4. Large volume pelvic ascites related to patient's known cirrhosis. 5. Mild myositis involving the right hip and pelvic muscles.  CXR 09/11/19: FINDINGS: There is shallow inspiration with bibasilar atelectasis. No focal consolidation, pleural effusion, or pneumothorax. Stable cardiac silhouette. No acute osseous pathology. IMPRESSION: No acute cardiopulmonary process.   EKG: 09/19/19: ST at 116 bpm   CV: BLE Venous US 09/13/19: Summary:  RIGHT:  - There is no evidence of deep vein  thrombosis in the lower extremity.  However, portions of this examination were limited- see technologist  comments above.  LEFT:  - There is no evidence of deep vein thrombosis in the lower extremity.  However, portions of this examination were limited- see technologist  comments above.     Echo 09/12/19: IMPRESSIONS  1. Normal LV function; trace MR; ascites noted on subcostal views.  2. Left ventricular ejection fraction, by estimation, is 60 to 65%. The  left ventricle has normal function. The left ventricle has no regional  wall motion abnormalities. Left ventricular diastolic parameters were  normal.  3. Right ventricular systolic function is normal. The right ventricular  size is normal.  4. The mitral valve is normal in structure and function. No evidence of  mitral valve regurgitation. No evidence of mitral stenosis.  5. The aortic valve is tricuspid. Aortic valve regurgitation is not  visualized. No aortic stenosis is present.  6. The inferior vena cava is normal in size with greater than 50%  respiratory variability, suggesting right atrial pressure of 3 mmHg.    TEE 06/16/19: IMPRESSIONS  1. Left ventricular ejection fraction, by visual estimation, is 60 to  65%. The left ventricle has normal function. Normal left ventricular size.  There is no left ventricular hypertrophy.  2. Left ventricular diastolic function could not be evaluated.  3. Global right ventricle has normal systolic function.The right  ventricular size is normal. No increase in right ventricular wall  thickness.  4. Left atrial size was normal.  5. Right atrial size was normal.  6. The mitral valve is normal in structure. Mild mitral valve  regurgitation. No evidence of mitral stenosis.  7. No trisuspid valve vegetation visualized.  8. The tricuspid valve is normal in structure. Tricuspid valve  regurgitation is mild.  9. The aortic valve is normal in structure. Aortic valve  regurgitation is  not visualized. Mild to moderate aortic valve sclerosis/calcification  without any evidence of aortic stenosis.  10. No pulmonic vegetation present.  11. The pulmonic valve was normal in structure. Pulmonic valve  regurgitation is not visualized.  12. The inferior vena cava is normal in size with greater than 50%  respiratory variability, suggesting right atrial pressure of 3 mmHg.  13. No vegetation was seen.    Past Medical History:  Diagnosis Date  . Anemia   . Anxiety   . Arthritis 05/13/2019   knees  . Cirrhosis ([place])   . Depression   . Diabetes mellitus without complication ([place])   . DVT (deep venous thrombosis) ([place])   . Gallstones   . Gluteal abscess   . Headache   . HTN (hypertension)   . Hyperlipidemia   . Sleep apnea   . Splenomegaly     Past Surgical History:  Procedure Laterality Date  . I & D EXTREMITY Left 07/16/2019   Procedure: LEFT HIP DEBRIDEMENT;  Surgeon: Newt Minion, MD;  Location: [facility];  Service: Orthopedics;  Laterality: Left;  . I & D EXTREMITY Left 07/18/2019   Procedure:  REPEAT IRRIGATION AND DEBRIDEMENT LEFT HIP;  Surgeon: Newt Minion, MD;  Location: [facility];  Service: Orthopedics;  Laterality: Left;  . I & D EXTREMITY Left 09/19/2019   Procedure: DEBRIDEMENT LEFT HIP Appilcattion OF WOUND VAC;  Surgeon: Newt Minion, MD;  Location: [facility];  Service: Orthopedics;  Laterality: Left;  . INCISION AND DRAINAGE ABSCESS Left 06/23/2019   Procedure: INCISION AND DRAINAGE GLUTEAL ABSCESS;  Surgeon: Renette Butters, MD;  Location: [facility];  Service: Orthopedics;  Laterality: Left;  . INCISION AND DRAINAGE ABSCESS Left 07/09/2019   Procedure: INCISION AND DRAINAGE LEFT HIP/PELVIC ABSCESS, APPLICATION OF NEGATIVE PRESSURE WOUND VAC;  Surgeon: Newt Minion, MD;  Location: [facility];  Service: Orthopedics;  Laterality: Left;  . INCISION AND DRAINAGE HIP Left 06/25/2019   Procedure: IRRIGATION AND DEBRIDEMENT HIP;  Surgeon: Shona Needles,  MD;  Location: [facility];  Service: Orthopedics;  Laterality: Left;  . INCISION AND DRAINAGE OF WOUND Left 07/12/2019   Procedure: LEFT HIP DEBRIDEMENT;  Surgeon: Newt Minion, MD;  Location: [facility];  Service: Orthopedics;  Laterality: Left;  . IR PARACENTESIS  09/13/2019  . IR US GUIDE BX ASP/DRAIN  06/17/2019  . IR US GUIDE BX ASP/DRAIN  09/13/2019  . KNEE ARTHROSCOPY Bilateral    left x 2, 1 right  . LAPAROSCOPIC CHOLECYSTECTOMY  ~ 2010  . LUMBAR DISC SURGERY    . NASAL SINUS SURGERY    . TEE WITHOUT CARDIOVERSION N/A 06/16/2019   Procedure: TRANSESOPHAGEAL ECHOCARDIOGRAM (TEE);  Surgeon: Dorothy Spark, MD;  Location: [facility] ENDOSCOPY;  Service: Cardiovascular;  Laterality: N/A;    MEDICATIONS: No current facility-administered medications for this encounter.   Marland Kitchen ampicillin IVPB  . aspirin 325 MG tablet  . blood glucose meter kit and supplies KIT  . Ensure Max Protein (ENSURE MAX PROTEIN) LIQD  . ferrous sulfate 325 (65 FE) MG tablet  . furosemide (LASIX) 40 MG tablet  . insulin aspart (NOVOLOG) 100 UNIT/ML injection  . insulin glargine (LANTUS) 100 UNIT/ML injection  . Insulin Syringes, Disposable, U-100 0.5 ML MISC  . Multiple Vitamin (MULTIVITAMIN WITH MINERALS) TABS tablet  . potassium chloride SA (KLOR-CON) 20 MEQ tablet  . spironolactone (ALDACTONE) 100 MG tablet    Myra Gianotti, PA-C Surgical Short Stay/Anesthesiology [facility] Phone [phone] [facility] Phone [phone] 10/07/2019 5:15 PM

## 2019-10-08 ENCOUNTER — Other Ambulatory Visit: Payer: Self-pay | Admitting: Physician Assistant

## 2019-10-08 ENCOUNTER — Ambulatory Visit: Payer: Self-pay | Admitting: Gastroenterology

## 2019-10-08 ENCOUNTER — Telehealth: Payer: Self-pay | Admitting: *Deleted

## 2019-10-08 ENCOUNTER — Inpatient Hospital Stay (HOSPITAL_COMMUNITY): Payer: Self-pay | Admitting: Vascular Surgery

## 2019-10-08 ENCOUNTER — Inpatient Hospital Stay (HOSPITAL_COMMUNITY)
Admission: RE | Admit: 2019-10-08 | Discharge: 2019-10-14 | DRG: 901 | Disposition: A | Discharge status: Home / Self Care | Payer: Self-pay | Attending: Internal Medicine | Admitting: Internal Medicine

## 2019-10-08 ENCOUNTER — Encounter (HOSPITAL_COMMUNITY)
Admission: RE | Disposition: A | Discharge status: Home / Self Care | Payer: Self-pay | Source: Home / Self Care | Attending: Internal Medicine

## 2019-10-08 DIAGNOSIS — L02416 Cutaneous abscess of left lower limb: Secondary | ICD-10-CM | POA: Diagnosis present

## 2019-10-08 DIAGNOSIS — Z9049 Acquired absence of other specified parts of digestive tract: Secondary | ICD-10-CM

## 2019-10-08 DIAGNOSIS — D62 Acute posthemorrhagic anemia: Secondary | ICD-10-CM | POA: Diagnosis present

## 2019-10-08 DIAGNOSIS — K767 Hepatorenal syndrome: Secondary | ICD-10-CM | POA: Diagnosis present

## 2019-10-08 DIAGNOSIS — R140 Abdominal distension (gaseous): Secondary | ICD-10-CM | POA: Diagnosis not present

## 2019-10-08 DIAGNOSIS — E119 Type 2 diabetes mellitus without complications: Secondary | ICD-10-CM | POA: Diagnosis present

## 2019-10-08 DIAGNOSIS — T8130XA Disruption of wound, unspecified, initial encounter: Principal | ICD-10-CM | POA: Diagnosis present

## 2019-10-08 DIAGNOSIS — A419 Sepsis, unspecified organism: Secondary | ICD-10-CM | POA: Diagnosis present

## 2019-10-08 DIAGNOSIS — Z79899 Other long term (current) drug therapy: Secondary | ICD-10-CM

## 2019-10-08 DIAGNOSIS — D638 Anemia in other chronic diseases classified elsewhere: Secondary | ICD-10-CM | POA: Diagnosis present

## 2019-10-08 DIAGNOSIS — E440 Moderate protein-calorie malnutrition: Secondary | ICD-10-CM | POA: Diagnosis present

## 2019-10-08 DIAGNOSIS — Z6841 Body Mass Index (BMI) 40.0 and over, adult: Secondary | ICD-10-CM

## 2019-10-08 DIAGNOSIS — D689 Coagulation defect, unspecified: Secondary | ICD-10-CM | POA: Diagnosis not present

## 2019-10-08 DIAGNOSIS — E860 Dehydration: Secondary | ICD-10-CM | POA: Diagnosis present

## 2019-10-08 DIAGNOSIS — Z882 Allergy status to sulfonamides status: Secondary | ICD-10-CM

## 2019-10-08 DIAGNOSIS — D649 Anemia, unspecified: Secondary | ICD-10-CM | POA: Diagnosis present

## 2019-10-08 DIAGNOSIS — R000 Tachycardia, unspecified: Secondary | ICD-10-CM | POA: Diagnosis present

## 2019-10-08 DIAGNOSIS — K7200 Acute and subacute hepatic failure without coma: Secondary | ICD-10-CM | POA: Diagnosis present

## 2019-10-08 DIAGNOSIS — N170 Acute kidney failure with tubular necrosis: Secondary | ICD-10-CM | POA: Diagnosis not present

## 2019-10-08 DIAGNOSIS — Z87891 Personal history of nicotine dependence: Secondary | ICD-10-CM

## 2019-10-08 DIAGNOSIS — I10 Essential (primary) hypertension: Secondary | ICD-10-CM | POA: Diagnosis present

## 2019-10-08 DIAGNOSIS — E8770 Fluid overload, unspecified: Secondary | ICD-10-CM | POA: Diagnosis not present

## 2019-10-08 DIAGNOSIS — K7581 Nonalcoholic steatohepatitis (NASH): Secondary | ICD-10-CM | POA: Diagnosis present

## 2019-10-08 DIAGNOSIS — E785 Hyperlipidemia, unspecified: Secondary | ICD-10-CM | POA: Diagnosis present

## 2019-10-08 DIAGNOSIS — K7460 Unspecified cirrhosis of liver: Secondary | ICD-10-CM | POA: Diagnosis present

## 2019-10-08 DIAGNOSIS — R188 Other ascites: Secondary | ICD-10-CM | POA: Diagnosis present

## 2019-10-08 DIAGNOSIS — L0231 Cutaneous abscess of buttock: Secondary | ICD-10-CM | POA: Diagnosis present

## 2019-10-08 DIAGNOSIS — D696 Thrombocytopenia, unspecified: Secondary | ICD-10-CM | POA: Diagnosis present

## 2019-10-08 DIAGNOSIS — Z20822 Contact with and (suspected) exposure to covid-19: Secondary | ICD-10-CM | POA: Diagnosis present

## 2019-10-08 DIAGNOSIS — N179 Acute kidney failure, unspecified: Secondary | ICD-10-CM | POA: Diagnosis present

## 2019-10-08 DIAGNOSIS — Z8249 Family history of ischemic heart disease and other diseases of the circulatory system: Secondary | ICD-10-CM

## 2019-10-08 DIAGNOSIS — Z794 Long term (current) use of insulin: Secondary | ICD-10-CM

## 2019-10-08 DIAGNOSIS — I959 Hypotension, unspecified: Secondary | ICD-10-CM | POA: Diagnosis not present

## 2019-10-08 DIAGNOSIS — E872 Acidosis: Secondary | ICD-10-CM | POA: Diagnosis present

## 2019-10-08 DIAGNOSIS — Z7982 Long term (current) use of aspirin: Secondary | ICD-10-CM

## 2019-10-08 DIAGNOSIS — E875 Hyperkalemia: Secondary | ICD-10-CM | POA: Diagnosis not present

## 2019-10-08 DIAGNOSIS — G4733 Obstructive sleep apnea (adult) (pediatric): Secondary | ICD-10-CM | POA: Diagnosis present

## 2019-10-08 DIAGNOSIS — E43 Unspecified severe protein-calorie malnutrition: Secondary | ICD-10-CM | POA: Diagnosis present

## 2019-10-08 DIAGNOSIS — F329 Major depressive disorder, single episode, unspecified: Secondary | ICD-10-CM | POA: Diagnosis present

## 2019-10-08 HISTORY — PX: I & D EXTREMITY: SHX5045

## 2019-10-08 LAB — RESPIRATORY PANEL BY RT PCR (FLU A&B, COVID)
Influenza A by PCR: NEGATIVE
Influenza B by PCR: NEGATIVE
SARS Coronavirus 2 by RT PCR: NEGATIVE

## 2019-10-08 LAB — HEMOGLOBIN AND HEMATOCRIT, BLOOD
HCT: 23 % — ABNORMAL LOW (ref 39.0–52.0)
Hemoglobin: 6.9 g/dL — CL (ref 13.0–17.0)

## 2019-10-08 LAB — GLUCOSE, CAPILLARY
Glucose-Capillary: 105 mg/dL — ABNORMAL HIGH (ref 70–99)
Glucose-Capillary: 94 mg/dL (ref 70–99)

## 2019-10-08 LAB — PREPARE RBC (CROSSMATCH)

## 2019-10-08 SURGERY — IRRIGATION AND DEBRIDEMENT EXTREMITY
Anesthesia: General | Site: Hip | Laterality: Left

## 2019-10-08 MED ORDER — ALBUMIN HUMAN 5 % IV SOLN
INTRAVENOUS | Status: AC
  Administered 2019-10-08: 25 g via INTRAVENOUS
  Filled 2019-10-08: qty 500

## 2019-10-08 MED ORDER — DOCUSATE SODIUM 100 MG PO CAPS
100.0000 mg | ORAL_CAPSULE | Freq: Two times a day (BID) | ORAL | Status: DC
  Filled 2019-10-08: qty 1

## 2019-10-08 MED ORDER — SPIRONOLACTONE 25 MG PO TABS
100.0000 mg | ORAL_TABLET | Freq: Every day | ORAL | Status: DC
  Administered 2019-10-09: 10:00:00 100 mg via ORAL
  Filled 2019-10-08 (×2): qty 4

## 2019-10-08 MED ORDER — OXYCODONE HCL 5 MG PO TABS
5.0000 mg | ORAL_TABLET | ORAL | Status: DC | PRN
  Administered 2019-10-08 – 2019-10-13 (×2): 10 mg via ORAL
  Filled 2019-10-08 (×2): qty 2

## 2019-10-08 MED ORDER — ONDANSETRON HCL 4 MG/2ML IJ SOLN: 4.0000 mg | Freq: Once | INTRAMUSCULAR | Status: DC | PRN

## 2019-10-08 MED ORDER — LACTATED RINGERS IV SOLN: INTRAVENOUS | Status: DC

## 2019-10-08 MED ORDER — ASPIRIN 325 MG PO TABS: 325.0000 mg | ORAL_TABLET | Freq: Every day | ORAL | Status: DC

## 2019-10-08 MED ORDER — OXYCODONE HCL 5 MG PO TABS
ORAL_TABLET | ORAL | Status: AC
  Filled 2019-10-08: qty 2

## 2019-10-08 MED ORDER — SODIUM CHLORIDE 0.9 % IR SOLN
Status: DC | PRN
  Administered 2019-10-08: 3000 mL

## 2019-10-08 MED ORDER — ENSURE MAX PROTEIN PO LIQD
11.0000 [oz_av] | Freq: Every day | ORAL | Status: DC
  Administered 2019-10-13: 13:00:00 11 [oz_av] via ORAL
  Filled 2019-10-08 (×6): qty 330

## 2019-10-08 MED ORDER — ALBUMIN HUMAN 5 % IV SOLN: 25.0000 g | Freq: Once | INTRAVENOUS | Status: AC

## 2019-10-08 MED ORDER — FENTANYL CITRATE (PF) 250 MCG/5ML IJ SOLN
INTRAMUSCULAR | Status: AC
  Filled 2019-10-08: qty 5

## 2019-10-08 MED ORDER — FUROSEMIDE 40 MG PO TABS
40.0000 mg | ORAL_TABLET | Freq: Every day | ORAL | Status: DC
  Administered 2019-10-09: 40 mg via ORAL
  Filled 2019-10-08: qty 1

## 2019-10-08 MED ORDER — MIDAZOLAM HCL 2 MG/2ML IJ SOLN
INTRAMUSCULAR | Status: AC
  Filled 2019-10-08: qty 2

## 2019-10-08 MED ORDER — LIDOCAINE 2% (20 MG/ML) 5 ML SYRINGE
INTRAMUSCULAR | Status: AC
  Filled 2019-10-08: qty 15

## 2019-10-08 MED ORDER — FENTANYL CITRATE (PF) 100 MCG/2ML IJ SOLN: 25.0000 ug | INTRAMUSCULAR | Status: DC | PRN

## 2019-10-08 MED ORDER — SODIUM CHLORIDE 0.9% IV SOLUTION: Freq: Once | INTRAVENOUS | Status: DC

## 2019-10-08 MED ORDER — LIDOCAINE 2% (20 MG/ML) 5 ML SYRINGE
INTRAMUSCULAR | Status: DC | PRN
  Administered 2019-10-08: 60 mg via INTRAVENOUS

## 2019-10-08 MED ORDER — METOCLOPRAMIDE HCL 5 MG/ML IJ SOLN
5.0000 mg | Freq: Three times a day (TID) | INTRAMUSCULAR | Status: DC | PRN
  Administered 2019-10-09: 10 mg via INTRAVENOUS
  Filled 2019-10-08: qty 2

## 2019-10-08 MED ORDER — FERROUS SULFATE 325 (65 FE) MG PO TABS
325.0000 mg | ORAL_TABLET | Freq: Every day | ORAL | Status: DC
  Administered 2019-10-12 – 2019-10-14 (×3): 325 mg via ORAL
  Filled 2019-10-08 (×4): qty 1

## 2019-10-08 MED ORDER — AMPICILLIN IV (FOR PTA / DISCHARGE USE ONLY): 2.0000 g | INTRAVENOUS | Status: DC

## 2019-10-08 MED ORDER — HYDROMORPHONE HCL 1 MG/ML IJ SOLN
0.5000 mg | INTRAMUSCULAR | Status: DC | PRN
  Administered 2019-10-09 – 2019-10-11 (×3): 0.5 mg via INTRAVENOUS
  Filled 2019-10-08 (×3): qty 1

## 2019-10-08 MED ORDER — SODIUM CHLORIDE 0.9 % IV SOLN
2.0000 g | INTRAVENOUS | Status: DC
  Administered 2019-10-08 – 2019-10-09 (×4): 2 g via INTRAVENOUS
  Filled 2019-10-08 (×2): qty 2000
  Filled 2019-10-08: qty 2
  Filled 2019-10-08 (×6): qty 2000

## 2019-10-08 MED ORDER — PROPOFOL 10 MG/ML IV BOLUS
INTRAVENOUS | Status: DC | PRN
  Administered 2019-10-08: 200 mg via INTRAVENOUS

## 2019-10-08 MED ORDER — POTASSIUM CHLORIDE CRYS ER 20 MEQ PO TBCR
40.0000 meq | EXTENDED_RELEASE_TABLET | Freq: Every day | ORAL | Status: DC
  Filled 2019-10-08: qty 2

## 2019-10-08 MED ORDER — ONDANSETRON HCL 4 MG/2ML IJ SOLN
4.0000 mg | Freq: Four times a day (QID) | INTRAMUSCULAR | Status: DC | PRN
  Administered 2019-10-09 (×2): 4 mg via INTRAVENOUS
  Filled 2019-10-08 (×2): qty 2

## 2019-10-08 MED ORDER — 0.9 % SODIUM CHLORIDE (POUR BTL) OPTIME
TOPICAL | Status: DC | PRN
  Administered 2019-10-08: 1000 mL

## 2019-10-08 MED ORDER — INSULIN ASPART 100 UNIT/ML ~~LOC~~ SOLN
0.0000 [IU] | Freq: Three times a day (TID) | SUBCUTANEOUS | Status: DC
  Administered 2019-10-10 – 2019-10-11 (×3): 3 [IU] via SUBCUTANEOUS
  Administered 2019-10-11: 5 [IU] via SUBCUTANEOUS
  Administered 2019-10-12 – 2019-10-13 (×4): 2 [IU] via SUBCUTANEOUS
  Administered 2019-10-13: 3 [IU] via SUBCUTANEOUS
  Administered 2019-10-13: 2 [IU] via SUBCUTANEOUS
  Administered 2019-10-14 (×2): 3 [IU] via SUBCUTANEOUS

## 2019-10-08 MED ORDER — PHENYLEPHRINE 40 MCG/ML (10ML) SYRINGE FOR IV PUSH (FOR BLOOD PRESSURE SUPPORT)
PREFILLED_SYRINGE | INTRAVENOUS | Status: AC
  Filled 2019-10-08: qty 20

## 2019-10-08 MED ORDER — PHENYLEPHRINE HCL (PRESSORS) 10 MG/ML IV SOLN
INTRAVENOUS | Status: DC | PRN
  Administered 2019-10-08 (×2): 80 ug via INTRAVENOUS

## 2019-10-08 MED ORDER — METOCLOPRAMIDE HCL 10 MG PO TABS: 5.0000 mg | ORAL_TABLET | Freq: Three times a day (TID) | ORAL | Status: DC | PRN

## 2019-10-08 MED ORDER — DEXAMETHASONE SODIUM PHOSPHATE 10 MG/ML IJ SOLN
INTRAMUSCULAR | Status: AC
  Filled 2019-10-08: qty 1

## 2019-10-08 MED ORDER — SODIUM CHLORIDE 0.9 % IV SOLN: INTRAVENOUS | Status: DC

## 2019-10-08 MED ORDER — DEXAMETHASONE SODIUM PHOSPHATE 10 MG/ML IJ SOLN
INTRAMUSCULAR | Status: DC | PRN
  Administered 2019-10-08: 4 mg via INTRAVENOUS

## 2019-10-08 MED ORDER — ADULT MULTIVITAMIN W/MINERALS CH
1.0000 | ORAL_TABLET | Freq: Every day | ORAL | Status: DC
  Filled 2019-10-08: qty 1

## 2019-10-08 MED ORDER — MIDAZOLAM HCL 2 MG/2ML IJ SOLN
INTRAMUSCULAR | Status: DC | PRN
  Administered 2019-10-08: 2 mg via INTRAVENOUS

## 2019-10-08 MED ORDER — ONDANSETRON HCL 4 MG PO TABS: 4.0000 mg | ORAL_TABLET | Freq: Four times a day (QID) | ORAL | Status: DC | PRN

## 2019-10-08 MED ORDER — FENTANYL CITRATE (PF) 100 MCG/2ML IJ SOLN
INTRAMUSCULAR | Status: DC | PRN
  Administered 2019-10-08: 100 ug via INTRAVENOUS
  Administered 2019-10-08: 50 ug via INTRAVENOUS

## 2019-10-08 MED ORDER — ONDANSETRON HCL 4 MG/2ML IJ SOLN
INTRAMUSCULAR | Status: DC | PRN
  Administered 2019-10-08: 4 mg via INTRAVENOUS

## 2019-10-08 MED ORDER — DEXTROSE 5 % IV SOLN
3.0000 g | INTRAVENOUS | Status: AC
  Administered 2019-10-08: 3 g via INTRAVENOUS
  Filled 2019-10-08: qty 3

## 2019-10-08 MED ORDER — ONDANSETRON HCL 4 MG/2ML IJ SOLN
INTRAMUSCULAR | Status: AC
  Filled 2019-10-08: qty 2

## 2019-10-08 MED ORDER — PROPOFOL 10 MG/ML IV BOLUS
INTRAVENOUS | Status: AC
  Filled 2019-10-08: qty 40

## 2019-10-08 SURGICAL SUPPLY — 34 items
BENZOIN TINCTURE PRP APPL 2/3 (GAUZE/BANDAGES/DRESSINGS) ×3 IMPLANT
BLADE SURG 21 STRL SS (BLADE) ×3 IMPLANT
BNDG COHESIVE 6X5 TAN STRL LF (GAUZE/BANDAGES/DRESSINGS) IMPLANT
BNDG GAUZE ELAST 4 BULKY (GAUZE/BANDAGES/DRESSINGS) ×6 IMPLANT
COVER SURGICAL LIGHT HANDLE (MISCELLANEOUS) ×6 IMPLANT
COVER WAND RF STERILE (DRAPES) ×3 IMPLANT
DRAPE U-SHAPE 47X51 STRL (DRAPES) ×3 IMPLANT
DRESSING VERAFLO CLEANSE CC (GAUZE/BANDAGES/DRESSINGS) ×2 IMPLANT
DRSG ADAPTIC 3X8 NADH LF (GAUZE/BANDAGES/DRESSINGS) ×3 IMPLANT
DRSG VERAFLO CLEANSE CC (GAUZE/BANDAGES/DRESSINGS) ×6
DURAPREP 26ML APPLICATOR (WOUND CARE) ×3 IMPLANT
ELECT REM PT RETURN 9FT ADLT (ELECTROSURGICAL)
ELECTRODE REM PT RTRN 9FT ADLT (ELECTROSURGICAL) IMPLANT
GAUZE SPONGE 4X4 12PLY STRL (GAUZE/BANDAGES/DRESSINGS) ×3 IMPLANT
GLOVE BIOGEL PI IND STRL 9 (GLOVE) ×1 IMPLANT
GLOVE BIOGEL PI INDICATOR 9 (GLOVE) ×2
GLOVE SURG ORTHO 9.0 STRL STRW (GLOVE) ×3 IMPLANT
GOWN STRL REUS W/ TWL XL LVL3 (GOWN DISPOSABLE) ×2 IMPLANT
GOWN STRL REUS W/TWL XL LVL3 (GOWN DISPOSABLE) ×4
HANDPIECE INTERPULSE COAX TIP (DISPOSABLE)
KIT BASIN OR (CUSTOM PROCEDURE TRAY) ×3 IMPLANT
KIT TURNOVER KIT B (KITS) ×3 IMPLANT
MANIFOLD NEPTUNE II (INSTRUMENTS) ×3 IMPLANT
NS IRRIG 1000ML POUR BTL (IV SOLUTION) ×3 IMPLANT
PACK ORTHO EXTREMITY (CUSTOM PROCEDURE TRAY) ×3 IMPLANT
PAD ARMBOARD 7.5X6 YLW CONV (MISCELLANEOUS) ×6 IMPLANT
SET HNDPC FAN SPRY TIP SCT (DISPOSABLE) IMPLANT
STOCKINETTE IMPERVIOUS 9X36 MD (GAUZE/BANDAGES/DRESSINGS) IMPLANT
SWAB COLLECTION DEVICE MRSA (MISCELLANEOUS) ×3 IMPLANT
SWAB CULTURE ESWAB REG 1ML (MISCELLANEOUS) IMPLANT
TOWEL GREEN STERILE (TOWEL DISPOSABLE) ×3 IMPLANT
TUBE CONNECTING 12'X1/4 (SUCTIONS) ×1
TUBE CONNECTING 12X1/4 (SUCTIONS) ×2 IMPLANT
YANKAUER SUCT BULB TIP NO VENT (SUCTIONS) ×3 IMPLANT

## 2019-10-08 NOTE — Telephone Encounter (Signed)
His original stop date was 10/10/2019 but he is hospitalized and will undergo another debridement with repeat cultures obtained.  It is best to leave the PICC in until we know what the new plan is for outpatient antibiotics will be.

## 2019-10-08 NOTE — Op Note (Signed)
10/08/2019  4:58 PM  PATIENT:  Robert Sullivan    PRE-OPERATIVE DIAGNOSIS:  Left Hip Wound Dehiscence  POST-OPERATIVE DIAGNOSIS:  Same  PROCEDURE: Excisional DEBRIDEMENT, local tissue rearrangement for LEFT HIP WOUND closure. Tissue sent for cultures.  SURGEON:  Newt Minion, MD  PHYSICIAN ASSISTANT:None ANESTHESIA:   General  PREOPERATIVE INDICATIONS:  Robert Sullivan is a  54 y.o. male with a diagnosis of Left Hip Wound Dehiscence who failed conservative measures and elected for surgical management.    The risks benefits and alternatives were discussed with the patient preoperatively including but not limited to the risks of infection, bleeding, nerve injury, cardiopulmonary complications, the need for revision surgery, among others, and the patient was willing to proceed.  OPERATIVE IMPLANTS: Cleanse choice wound VAC sponges x4  @ENCIMAGES @  OPERATIVE FINDINGS: Hyper granulation tissue with large cavernous wound dehiscence  OPERATIVE PROCEDURE: Patient brought the operating room and underwent a general anesthetic.  After adequate levels anesthesia were obtained patient's left lower extremity was prepped using DuraPrep draped into a sterile field a timeout was called.  Elliptical incision was made around the wound dehiscence.  This was 20 x 10 cm all margins of the wound were resected back to healthy viable bleeding wound edges.  The wound was irrigated with normal saline.  Due to the large cavernous nature of the wound this was packed open with 4 cleanse choice wound VAC sponges.  Local tissue rearrangement was then used to close the wound over the sponges.  The wound was covered with [place]an and 2 suction drains were placed patient was extubated taken the PACU in stable condition   DISCHARGE PLANNING:  Antibiotic duration: Continue IV antibiotics  Weightbearing: Weightbearing as tolerated  Pain medication: Opioid pathway  Dressing care/ Wound VAC: Cleanse choice wound VAC sponges  x4  Ambulatory devices: Device as needed  Discharge to: Anticipate return to the operating room on Friday for repeat debridement and wound closure  Follow-up: In the office 1 week post operative.

## 2019-10-08 NOTE — Telephone Encounter (Signed)
Relayed to Coretta at [facility]. Thanks!

## 2019-10-08 NOTE — Anesthesia Procedure Notes (Signed)
Procedure Name: LMA Insertion Date/Time: 10/08/2019 1:05 PM Performed by: Leonor Liv, CRNA Pre-anesthesia Checklist: Patient identified, Emergency Drugs available, Suction available and Patient being monitored Patient Re-evaluated:Patient Re-evaluated prior to induction Oxygen Delivery Method: Circle System Utilized Preoxygenation: Pre-oxygenation with 100% oxygen Induction Type: IV induction Ventilation: Mask ventilation without difficulty LMA: LMA inserted LMA Size: 5.0 Number of attempts: 1 Airway Equipment and Method: Bite block Placement Confirmation: positive ETCO2 Tube secured with: Tape Dental Injury: Teeth and Oropharynx as per pre-operative assessment

## 2019-10-08 NOTE — Telephone Encounter (Signed)
Received call from [facility] regarding pull PICC orders. Patient currently hospitalized for I&D.  Will 'cc to covering provider. Landis Gandy, RN

## 2019-10-08 NOTE — H&P (Signed)
Robert Sullivan is an 54 y.o. male.   Chief Complaint: Left HipWound Dehiscence HPI: This is a pleasant 54 year old gentleman who is now 2-1/2 weeks status post repeat irrigation and debridement of his left hip.  His wife has been doing dressing changes at home for him.  He presents today for follow-up.  He still continues to have moderate drainage.  His most recent hemoglobin is 8.4  Past Medical History:  Diagnosis Date  . Anemia   . Anxiety   . Arthritis 05/13/2019   knees  . Cirrhosis ([place])   . Depression   . Diabetes mellitus without complication ([place])   . DVT (deep venous thrombosis) ([place])   . Gallstones   . Gluteal abscess   . Headache   . HTN (hypertension)   . Hyperlipidemia   . Sleep apnea    doesn't use cpap  . Splenomegaly     Past Surgical History:  Procedure Laterality Date  . I & D EXTREMITY Left 07/16/2019   Procedure: LEFT HIP DEBRIDEMENT;  Surgeon: Newt Minion, MD;  Location: [place];  Service: Orthopedics;  Laterality: Left;  . I & D EXTREMITY Left 07/18/2019   Procedure: REPEAT IRRIGATION AND DEBRIDEMENT LEFT HIP;  Surgeon: Newt Minion, MD;  Location: [place];  Service: Orthopedics;  Laterality: Left;  . I & D EXTREMITY Left 09/19/2019   Procedure: DEBRIDEMENT LEFT HIP Appilcattion OF WOUND VAC;  Surgeon: Newt Minion, MD;  Location: [place];  Service: Orthopedics;  Laterality: Left;  . INCISION AND DRAINAGE ABSCESS Left 06/23/2019   Procedure: INCISION AND DRAINAGE GLUTEAL ABSCESS;  Surgeon: Renette Butters, MD;  Location: [place];  Service: Orthopedics;  Laterality: Left;  . INCISION AND DRAINAGE ABSCESS Left 07/09/2019   Procedure: INCISION AND DRAINAGE LEFT HIP/PELVIC ABSCESS, APPLICATION OF NEGATIVE PRESSURE WOUND VAC;  Surgeon: Newt Minion, MD;  Location: [place];  Service: Orthopedics;  Laterality: Left;  . INCISION AND DRAINAGE HIP Left 06/25/2019   Procedure: IRRIGATION AND DEBRIDEMENT HIP;  Surgeon: Shona Needles, MD;  Location: [place];  Service:  Orthopedics;  Laterality: Left;  . INCISION AND DRAINAGE OF WOUND Left 07/12/2019   Procedure: LEFT HIP DEBRIDEMENT;  Surgeon: Newt Minion, MD;  Location: [place];  Service: Orthopedics;  Laterality: Left;  . IR PARACENTESIS  09/13/2019  . IR US GUIDE BX ASP/DRAIN  06/17/2019  . IR US GUIDE BX ASP/DRAIN  09/13/2019  . KNEE ARTHROSCOPY Bilateral    left x 2, 1 right  . LAPAROSCOPIC CHOLECYSTECTOMY  ~ 2010  . LUMBAR DISC SURGERY    . NASAL SINUS SURGERY    . TEE WITHOUT CARDIOVERSION N/A 06/16/2019   Procedure: TRANSESOPHAGEAL ECHOCARDIOGRAM (TEE);  Surgeon: Dorothy Spark, MD;  Location: [facility] ENDOSCOPY;  Service: Cardiovascular;  Laterality: N/A;    Family History  Problem Relation Age of Onset  . Hypertension Father    Social History:  reports that he quit smoking about 2 years ago. His smoking use included cigars. He has never used smokeless tobacco. He reports previous alcohol use. He reports previous drug use.  Allergies:  Allergies  Allergen Reactions  . Sulfa Antibiotics Rash    Total body rash    No medications prior to admission.    Results for orders placed or performed during the hospital encounter of 10/06/19 (from the past 48 hour(s))  Basic metabolic panel     Status: Abnormal   Collection Time: 10/06/19 10:15 AM  Result Value Ref Range  Sodium 136 135 - 145 mmol/L   Potassium 3.5 3.5 - 5.1 mmol/L   Chloride 104 98 - 111 mmol/L   CO2 25 22 - 32 mmol/L   Glucose, Bld 113 (H) 70 - 99 mg/dL    Comment: Glucose reference range applies only to samples taken after fasting for at least 8 hours.   BUN 15 6 - 20 mg/dL   Creatinine, Ser 0.70 0.61 - 1.24 mg/dL   Calcium 7.9 (L) 8.9 - 10.3 mg/dL   GFR calc non Af Amer >60 >60 mL/min   GFR calc Af Amer >60 >60 mL/min   Anion gap 7 5 - 15    Comment: Performed at [facility], [place] [address], [place], [place] [address]  CBC with Differential/Platelet     Status: Abnormal   Collection Time: 10/06/19 10:15  AM  Result Value Ref Range   WBC 4.9 4.0 - 10.5 K/uL   RBC 3.18 (L) 4.22 - 5.81 MIL/uL   Hemoglobin 8.4 (L) 13.0 - 17.0 g/dL   HCT 27.2 (L) 39.0 - 52.0 %   MCV 85.5 80.0 - 100.0 fL   MCH 26.4 26.0 - 34.0 pg   MCHC 30.9 30.0 - 36.0 g/dL   RDW 20.0 (H) 11.5 - 15.5 %   Platelets 104 (L) 150 - 400 K/uL    Comment: REPEATED TO VERIFY PLATELET COUNT CONFIRMED BY SMEAR SPECIMEN CHECKED FOR CLOTS Immature Platelet Fraction may be clinically indicated, consider ordering this additional test [id number]    nRBC 0.0 0.0 - 0.2 %   Neutrophils Relative % 68 %   Neutro Abs 3.3 1.7 - 7.7 K/uL   Lymphocytes Relative 16 %   Lymphs Abs 0.8 0.7 - 4.0 K/uL   Monocytes Relative 10 %   Monocytes Absolute 0.5 0.1 - 1.0 K/uL   Eosinophils Relative 5 %   Eosinophils Absolute 0.3 0.0 - 0.5 K/uL   Basophils Relative 1 %   Basophils Absolute 0.1 0.0 - 0.1 K/uL   Immature Granulocytes 0 %   Abs Immature Granulocytes 0.01 0.00 - 0.07 K/uL   Burr Cells PRESENT     Comment: Performed at [facility], [place] [address], [place], [place] [address]  C-reactive protein     Status: Abnormal   Collection Time: 10/06/19 10:15 AM  Result Value Ref Range   CRP 2.8 (H) <1.0 mg/dL    Comment: Performed at [facility], [place] [address], [place], [place] [address]  Sedimentation rate     Status: None   Collection Time: 10/06/19 10:15 AM  Result Value Ref Range   Sed Rate 12 0 - 16 mm/hr    Comment: Performed at [facility], [place] [address], [place], [place] [address]   No results found.  Review of Systems  All other systems reviewed and are negative.   There were no vitals taken for this visit. Physical Exam  Patient is alert, oriented, no adenopathy, well-dressed, normal affect, normal respiratory effort Focused examination of the wound demonstrates wound breakdown and dehiscence in the upper third of the wound.  There is no foul odor some serosanguineous  drainage.  The wound probes deep.  Mild surrounding irritation in the skin.  No necrosis Lungs Clear Heart RRR Assessment/Plan Plan: Patient was seen today by Dr. Sharol Given.  Patient had an extensive discussion with Dr. Sharol Given as well as with his wife.  Review of his progress up to this point was extensively discussed.  We  have recommended repeat irrigation and debridement and closure of his wound.  We will do repeat cultures.  If the cultures are negative we would engage infectious disease further recommendations he understands that he will be admitted to the hospital for period of 5 days to allow for the cultures to finalize.  Reviewed the risks of surgery which include but are not limited to bleeding infection need for future surgery  Bevely Palmer Dianna Ewald, PA 10/08/2019, 6:44 AM

## 2019-10-09 ENCOUNTER — Encounter: Payer: Self-pay | Admitting: *Deleted

## 2019-10-09 DIAGNOSIS — D649 Anemia, unspecified: Secondary | ICD-10-CM | POA: Diagnosis present

## 2019-10-09 DIAGNOSIS — N179 Acute kidney failure, unspecified: Secondary | ICD-10-CM | POA: Diagnosis present

## 2019-10-09 LAB — CBC
HCT: 24.6 % — ABNORMAL LOW (ref 39.0–52.0)
HCT: 23 % — ABNORMAL LOW (ref 39.0–52.0)
Hemoglobin: 7.8 g/dL — ABNORMAL LOW (ref 13.0–17.0)
Hemoglobin: 7.3 g/dL — ABNORMAL LOW (ref 13.0–17.0)
MCH: 26.8 pg (ref 26.0–34.0)
MCH: 27.9 pg (ref 26.0–34.0)
MCHC: 31.7 g/dL (ref 30.0–36.0)
MCHC: 31.7 g/dL (ref 30.0–36.0)
MCV: 84.5 fL (ref 80.0–100.0)
MCV: 87.8 fL (ref 80.0–100.0)
Platelets: 238 10*3/uL (ref 150–400)
Platelets: 323 10*3/uL (ref 150–400)
RBC: 2.91 MIL/uL — ABNORMAL LOW (ref 4.22–5.81)
RBC: 2.62 MIL/uL — ABNORMAL LOW (ref 4.22–5.81)
RDW: 18.3 % — ABNORMAL HIGH (ref 11.5–15.5)
RDW: 18.6 % — ABNORMAL HIGH (ref 11.5–15.5)
WBC: 17.8 10*3/uL — ABNORMAL HIGH (ref 4.0–10.5)
WBC: 27.3 10*3/uL — ABNORMAL HIGH (ref 4.0–10.5)
nRBC: 0 % (ref 0.0–0.2)
nRBC: 0 % (ref 0.0–0.2)

## 2019-10-09 LAB — COMPREHENSIVE METABOLIC PANEL
ALT: 178 U/L — ABNORMAL HIGH (ref 0–44)
AST: 449 U/L — ABNORMAL HIGH (ref 15–41)
Albumin: 1.9 g/dL — ABNORMAL LOW (ref 3.5–5.0)
Alkaline Phosphatase: 91 U/L (ref 38–126)
Anion gap: 18 — ABNORMAL HIGH (ref 5–15)
BUN: 26 mg/dL — ABNORMAL HIGH (ref 6–20)
CO2: 14 mmol/L — ABNORMAL LOW (ref 22–32)
Calcium: 8.3 mg/dL — ABNORMAL LOW (ref 8.9–10.3)
Chloride: 102 mmol/L (ref 98–111)
Creatinine, Ser: 2.38 mg/dL — ABNORMAL HIGH (ref 0.61–1.24)
GFR calc Af Amer: 35 mL/min — ABNORMAL LOW (ref >60)
GFR calc non Af Amer: 30 mL/min — ABNORMAL LOW (ref >60)
Glucose, Bld: 149 mg/dL — ABNORMAL HIGH (ref 70–99)
Potassium: 6 mmol/L — ABNORMAL HIGH (ref 3.5–5.1)
Sodium: 134 mmol/L — ABNORMAL LOW (ref 135–145)
Total Bilirubin: 2.8 mg/dL — ABNORMAL HIGH (ref 0.3–1.2)
Total Protein: 4.6 g/dL — ABNORMAL LOW (ref 6.5–8.1)

## 2019-10-09 LAB — PROTIME-INR
INR: 2.5 — ABNORMAL HIGH (ref 0.8–1.2)
Prothrombin Time: 26.9 seconds — ABNORMAL HIGH (ref 11.4–15.2)

## 2019-10-09 LAB — GLUCOSE, CAPILLARY
Glucose-Capillary: 127 mg/dL — ABNORMAL HIGH (ref 70–99)
Glucose-Capillary: 191 mg/dL — ABNORMAL HIGH (ref 70–99)

## 2019-10-09 LAB — PREPARE RBC (CROSSMATCH)

## 2019-10-09 LAB — HEMOGLOBIN AND HEMATOCRIT, BLOOD
HCT: 22.7 % — ABNORMAL LOW (ref 39.0–52.0)
Hemoglobin: 7.3 g/dL — ABNORMAL LOW (ref 13.0–17.0)

## 2019-10-09 MED ORDER — SODIUM POLYSTYRENE SULFONATE 15 GM/60ML PO SUSP
15.0000 g | Freq: Once | ORAL | Status: AC
  Administered 2019-10-09: 15 g via ORAL
  Filled 2019-10-09: qty 60

## 2019-10-09 MED ORDER — SODIUM CHLORIDE 0.9% FLUSH: 10.0000 mL | INTRAVENOUS | Status: DC | PRN

## 2019-10-09 MED ORDER — SODIUM CHLORIDE 0.9 % IV BOLUS
500.0000 mL | Freq: Once | INTRAVENOUS | Status: AC
  Administered 2019-10-09: 500 mL via INTRAVENOUS

## 2019-10-09 MED ORDER — POTASSIUM CHLORIDE 10 MEQ/100ML IV SOLN: 10.0000 meq | INTRAVENOUS | Status: DC

## 2019-10-09 MED ORDER — SODIUM CHLORIDE 0.9 % IV SOLN
2.0000 g | Freq: Two times a day (BID) | INTRAVENOUS | Status: DC
  Administered 2019-10-10 (×2): 2 g via INTRAVENOUS
  Filled 2019-10-09 (×5): qty 2

## 2019-10-09 MED ORDER — CHLORHEXIDINE GLUCONATE CLOTH 2 % EX PADS
6.0000 | MEDICATED_PAD | Freq: Every day | CUTANEOUS | Status: DC
  Administered 2019-10-10 – 2019-10-14 (×5): 6 via TOPICAL

## 2019-10-09 MED ORDER — SODIUM CHLORIDE 0.9% IV SOLUTION: Freq: Once | INTRAVENOUS | Status: DC

## 2019-10-09 MED ORDER — SODIUM CHLORIDE 0.9% FLUSH
10.0000 mL | Freq: Two times a day (BID) | INTRAVENOUS | Status: DC
  Administered 2019-10-10 – 2019-10-13 (×9): 10 mL

## 2019-10-09 MED ORDER — ZOLPIDEM TARTRATE 5 MG PO TABS
5.0000 mg | ORAL_TABLET | Freq: Every evening | ORAL | Status: DC | PRN
  Administered 2019-10-11 – 2019-10-13 (×3): 5 mg via ORAL
  Filled 2019-10-09 (×3): qty 1

## 2019-10-09 MED ORDER — VITAMIN K1 10 MG/ML IJ SOLN
5.0000 mg | Freq: Once | INTRAVENOUS | Status: AC
  Administered 2019-10-09: 20:00:00 5 mg via INTRAVENOUS
  Filled 2019-10-09: qty 0.5

## 2019-10-09 MED ORDER — DEXTROSE 5 % IV SOLN
3.0000 g | INTRAVENOUS | Status: DC
  Filled 2019-10-09: qty 3000

## 2019-10-09 MED ORDER — SODIUM CHLORIDE 0.9% IV SOLUTION: Freq: Once | INTRAVENOUS | Status: AC

## 2019-10-09 MED ORDER — SODIUM CHLORIDE 0.9 % IV SOLN: INTRAVENOUS | Status: DC

## 2019-10-09 NOTE — Progress Notes (Signed)
Patient ID: Robert Sullivan, male   DOB: Sep 27, 1965, 54 y.o.   MRN: [id number]          [facility]    Date of Admission:  10/08/2019   Day 21 ampicillin         Mr. Lapinsky is well-known to our service.  He was hospitalized in early December with MSSA bacteremia and a large left gluteal abscess.  He underwent incision and drainage.  Repeat blood cultures were negative and there was no evidence of endocarditis on TEE.  He was discharged on IV cefazolin with the plan for 6 weeks of therapy but had worsening of the abscess leading to readmission on 07/09/2019.  He underwent repeat I&D.  Abscess cultures grew Enterobacter.  He was discharged on IV cefepime with a plan for 4 weeks of therapy.    He appeared to be doing well but then had developed anasarca.  Previous scans had suggested the possibility of cirrhosis.  He was evaluated by  GI on 09/11/2019.  Because of his severe volume overload he was directed to the emergency department for readmission.  Abdominal CT scan showed ascites and recurrent left gluteal abscess.  He underwent I&D again.  Cultures grew Enterococcus.  He was discharged on IV ampicillin and has now completed 3 weeks of therapy.  Unfortunately he developed wound dehiscence and was readmitted yesterday for another surgery.  Operative Gram stain did not reveal any organisms.  Cultures are pending.  As soon as I entered his room today he indicated that he was very tired and somewhat confused.  He called his wife who is currently in [place] and we had a 30-minute conversation.  His wife expressed extreme frustration about the care he has received.  She feels that there has been a lack of communication and poor coordination of his care.  She indicated that she wanted a second opinion evaluation from another gastroenterology provider.  She seemed very upset about his last hospitalization. She could not understand how Enterococcus grew from the initial culture on  09/13/2019 but nothing grew from the repeat culture on 09/19/2019.  I told her that that was probably because he had been on ampicillin and vancomycin prior to the surgery on 09/19/2019.  She she question why I he needed his last surgery in early March when he was sent to the hospital because of cirrhosis.  His wife was so upset, I had difficulty following her conversation.  I promised her that I would speak with Dr. Sharol Given and do my best to help determine if ongoing wound infection is impacting his ability to heal.         Michel Bickers, [place] [facility] [place] [phone] pager   [phone] cell 10/09/2019, 12:22 PM

## 2019-10-09 NOTE — Progress Notes (Signed)
PT Cancellation Note  Patient Details Name: Robert Sullivan MRN: [id number] DOB: 1965-08-11   Cancelled Treatment:    Reason Eval/Treat Not Completed: Medical issues which prohibited therapy  Per RN, Dressing is still leaking and pt currently feels "horrible." Agreed to reattempt later today.    [name], PT Pager [phone]  [name] 10/09/2019, 11:06 AM

## 2019-10-09 NOTE — Progress Notes (Signed)
PT Cancellation Note  Patient Details Name: Robert Sullivan MRN: [id number] DOB: Nov 08, 1965   Cancelled Treatment:    Reason Eval/Treat Not Completed: Medical issues which prohibited therapy  Second attempt. Nursing performing in/out cath and starting blood.    [name], PT Pager [phone]   [name] 10/09/2019, 4:13 PM

## 2019-10-09 NOTE — Progress Notes (Signed)
OT Cancellation Note  Patient Details Name: Robert Sullivan MRN: [id number] DOB: 11/10/1965   Cancelled Treatment:    Reason Eval/Treat Not Completed: Medical issues which prohibited therapy(Wound VAC leaking and RN request hold. Will return as schedule allows.)  [place], OTR/L Acute Rehab Pager: [phone] Office: [phone] 10/09/2019, 9:34 AM

## 2019-10-09 NOTE — Progress Notes (Signed)
Patient ID: Robert Sullivan, male   DOB: 11-09-1965, 54 y.o.   MRN: [id number] Patient is seen postoperative day 1 status post repeat debridement left hip status post wound dehiscence.  Received 2 units of packed red blood cells postoperatively his hemoglobin is 7.8 preoperatively his hemoglobin was 7.1 about 2 weeks ago.  Patient scheduled for 1 additional unit of packed red blood cells.  The wound VAC is currently clogged and blood is collected under the dressing.  We will have the dressing removed apply 4 x 4 gauze ABD dressing and Hypafix tape.  Plan for return to the operating room tomorrow Friday.  Cultures are pending.

## 2019-10-09 NOTE — Consult Note (Signed)
Medical Consultation   [name]  NOM:[id number]  DOB: 1965/12/14  DOA: 10/08/2019  PCP: Cathleen Corti, PA-C   Outpatient Specialists: Sharol Given - orthopedics;  [facility]   Requesting physician: Dr. Sharol Given  Reason for consultation: Recurrent hip abscess s/p 9 I&Ds.  Has cirrhosis, GI is following, s/p paracentesis.  SOB, jaundice, recurrent ascites today.  Another washout scheduled for tomorrow, with hopefully closure.  GI to see tomorrow (wants a Marine scientist).   History of Present Illness: Robert Sullivan is an 54 y.o. male with h/o OSA; HTN; HLD; DM; morbid obesity; cirrhosis; and depression who presented yesterday (3/31) for wound dehiscence.  He had prior admission for MSSA bacteremia with a large L gluteal abscess that was treated with I&D and IV Cefazollin in December.  He was readmitted in late December for worsening infection, with repeat I&D growing Enterobacter; he was discharged with IV Cefepime.  He was readmitted for repeat I&D in early March with cultures again growing Enterococcus, discharged on IV ampicillin.    His initial visit was on 11/1 after a fall; he was able to ambulate and was discharged from the ER.  He returned and was admitted from 11/2-7 with sepsis secondary to UTI; culture grew E coli resistant only to Cipro.  MRI showed a tear of the L gluteus maximus with a 50 mL hematoma.  He returned from 12/1-21 with MSSA bacteremia (resistant only to Tetracycline) with disseminated infection from UTI and L gluteal abscess (both urine and blood cultures positive with the same organism/sensitivities).  He was discharged with IV cefazolin for 6 weeks.  During that hospitalization he had TEE; I&D on 12/14 and 12/16.  He was hospitalized again from 12/30-1/15 with recurrent L hip abscess with I&D on 12/30 (wound culture + Enterobacter Cloacae, resistant to Cefazolin); debridement on 1/2 and 1/6 (wound culture with Enterococcus faecalis); and repeat I&D on 1/8;  he was discharged home on Cefepime.    He was admitted again from 3/4-18 due to anasarca and decompensated liver failure.  During that hospitalization, he underwent US-guided aspiration of the wound on 3/6 (wound culture again positive for E faecalis) and repeat I&D on 3/12 with wound vac placement.  He had large-volume paracentesis (9L) on 3/6 for decompensated NASH cirrhosis.  He was seen in the office on 3/30 for wound dehiscence and was readmitted for I&D on 3/31 (wound culture pending but gram stain positive for GNR) and hip wound revision.  He is scheduled to return to the OR tomorrow for yet another I&D.  He reports that yesterday he began feeling SOB and nauseated.  His formerly positive and optimistic attitude is now frustrated and defeated.  He feels weak and tired.  He is not having pain.  His abdominal distention is markedly improved.    I spoke with his wife.  When she looks at things done to date... she understand that there was a culture with the paracentesis; it came back negative.  Her husband told her that the fluid was pulled out and on the 5th day she isn't sure what the bacteria is.  Then he had the surgery and the cultures were negative.  They were focusing on the cirrhosis and "we hear nothing about that."    Review of Systems:  ROS As per HPI otherwise 10 point review of systems negative.    Past Medical History: Past Medical History:  Diagnosis Date  Anemia    Anxiety    Arthritis 05/13/2019   knees   Cirrhosis ([place])    Depression    Diabetes mellitus without complication ([facility])    DVT (deep venous thrombosis) ([facility])    Gallstones    Gluteal abscess    Headache    HTN (hypertension)    Hyperlipidemia    Sleep apnea    doesn't use cpap   Splenomegaly     Past Surgical History: Past Surgical History:  Procedure Laterality Date   I & D EXTREMITY Left 07/16/2019   Procedure: LEFT HIP DEBRIDEMENT;  Surgeon: Newt Minion, MD;  Location: [place];   Service: Orthopedics;  Laterality: Left;   I & D EXTREMITY Left 07/18/2019   Procedure: REPEAT IRRIGATION AND DEBRIDEMENT LEFT HIP;  Surgeon: Newt Minion, MD;  Location: [place];  Service: Orthopedics;  Laterality: Left;   I & D EXTREMITY Left 09/19/2019   Procedure: DEBRIDEMENT LEFT HIP Appilcattion OF WOUND VAC;  Surgeon: Newt Minion, MD;  Location: [place];  Service: Orthopedics;  Laterality: Left;   I & D EXTREMITY Left 10/08/2019   Procedure: IRRIGATION AND DEBRIDEMENT, REVISION LEFT HIP WOUND;  Surgeon: Newt Minion, MD;  Location: [place];  Service: Orthopedics;  Laterality: Left;   INCISION AND DRAINAGE ABSCESS Left 06/23/2019   Procedure: INCISION AND DRAINAGE GLUTEAL ABSCESS;  Surgeon: Renette Butters, MD;  Location: [place];  Service: Orthopedics;  Laterality: Left;   INCISION AND DRAINAGE ABSCESS Left 07/09/2019   Procedure: INCISION AND DRAINAGE LEFT HIP/PELVIC ABSCESS, APPLICATION OF NEGATIVE PRESSURE WOUND VAC;  Surgeon: Newt Minion, MD;  Location: [place];  Service: Orthopedics;  Laterality: Left;   INCISION AND DRAINAGE HIP Left 06/25/2019   Procedure: IRRIGATION AND DEBRIDEMENT HIP;  Surgeon: Shona Needles, MD;  Location: [place];  Service: Orthopedics;  Laterality: Left;   INCISION AND DRAINAGE OF WOUND Left 07/12/2019   Procedure: LEFT HIP DEBRIDEMENT;  Surgeon: Newt Minion, MD;  Location: [place];  Service: Orthopedics;  Laterality: Left;   IR PARACENTESIS  09/13/2019   IR US GUIDE BX ASP/DRAIN  06/17/2019   IR US GUIDE BX ASP/DRAIN  09/13/2019   KNEE ARTHROSCOPY Bilateral    left x 2, 1 right   LAPAROSCOPIC CHOLECYSTECTOMY  ~ 2010   LUMBAR DISC SURGERY     NASAL SINUS SURGERY     TEE WITHOUT CARDIOVERSION N/A 06/16/2019   Procedure: TRANSESOPHAGEAL ECHOCARDIOGRAM (TEE);  Surgeon: Dorothy Spark, MD;  Location: [facility] ENDOSCOPY;  Service: Cardiovascular;  Laterality: N/A;     Allergies:   Allergies  Allergen Reactions   Sulfa Antibiotics Rash    Total  body rash     Social History:  reports that he quit smoking about 2 years ago. His smoking use included cigars. He has never used smokeless tobacco. He reports previous alcohol use. He reports previous drug use.   Family History: Family History  Problem Relation Age of Onset   Hypertension Father       Physical Exam: Vitals:   10/09/19 1148 10/09/19 1400 10/09/19 1630 10/09/19 1716  BP: (!) 101/54  (!) 120/98 (!) 86/53  Pulse: (!) 111 99  (!) 110  Resp: 13 11  13   Temp: (!) 97.3 F (36.3 C)  97.9 F (36.6 C) 98.1 F (36.7 C)  TempSrc:   Oral Oral  SpO2: 100% 99%  100%  Weight:      Height:  Constitutional: Alert and awake, oriented x3, not in any acute distress.  Intermittent nausea.  +pallor. Eyes:  EOMI, irises appear normal, anicteric sclera,  ENMT: external ears and nose appear normal, normal hearing, Lips appear normal, oropharynx mucosa, tongue, posterior pharynx appear normal  Neck: neck appears normal, no masses, normal ROM, no thyromegaly, no JVD  CVS: S1-S2 clear, sinus tachycardia in 110 range, no murmur rubs or gallops, no LE edema, normal pedal pulses  Respiratory:  clear to auscultation bilaterally, no wheezing, rales or rhonchi. Respiratory effort normal. No accessory muscle use.  Abdomen: soft nontender, nondistended, no fluid wave Musculoskeletal: : L leg with lateral thigh wound vac, yet still draining serosanguinous drainage significantly Neuro: Cranial nerves II-XII grossly intact Psych: judgement and insight appear normal, flat mood and affect, mental status Skin: no rashes or lesions or ulcers, no induration or nodules    Data reviewed:  I have personally reviewed the recent labs and imaging studies  Pertinent Labs:   WBC 17.8 -> 27.3 Hgb 7.8 -> 7.3 INR 2.5 Na++ 134 K+ 6.0 CO2 14 Glucose 149 BUN 26/Creatinine 2.38/GFR 30; 15/0.70/>60 on 3/29 Anion gap 18 Albumin 1.9 AST 449/ALT P/Bili 2.8   Inpatient Medications:   Scheduled  Meds:  sodium chloride   Intravenous Once   aspirin  325 mg Oral Daily   docusate sodium  100 mg Oral BID   ferrous sulfate  325 mg Oral Q breakfast   insulin aspart  0-15 Units Subcutaneous TID WC   Ensure Max Protein  11 oz Oral Daily   Continuous Infusions:  sodium chloride     [START ON 10/10/2019]  ceFAZolin (ANCEF) IV     ceFEPime (MAXIPIME) IV     phytonadione (VITAMIN K) IV     sodium chloride       Radiological Exams on Admission: No results found.  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 Megan Salon and will change back to Cefepime (pharmacy to dose)  Cirrhosis -Thought to be due to NASH -INR is up to 2.5, which may be contributing to his sanguinous drainage; will add 5 mg IV vitamin K in anticipation of surgery tomorrow -MELD/MELD-Na score is 29/31, with a mortality rate of 19.6% -Hold diuretics for now due to AKI - although this increases the risk of anasarca again -Consult from [place] is pending (since she was very unhappy with [facility]) - inpatient vs. Outpatient -Patient requests discontinuation of Colace and this has been  discontinued  Anemia -Hgb downtrending from 8.4 on 3/29 to 7.3 now -Will transfuse 2 units in anticipation of surgery again tomorrow  [place] -Patient with excessive wound drainage and poor UOP -Now with AKI, anion gap acidosis from dehydration; will bolus x 1 L and  then give IVF at 125 cc/hr now -Recheck CMP at 2200 (will sign out to on-call App) and again in AM  DM -Recent A1c was 4.6 -Diet controlled at home -Cover with moderate-scale SSI  Morbid obesity -BMI 42 -This is likely impeding his healing -Weight loss should be encouraged -Outpatient PCP/bariatric medicine/bariatric surgery f/u encouraged  Adjustment d/o -We discussed counseling for long-term illness - will consider as outpatient -No SSRI for now but consider based on ongoing mood -Will give sleep aid    Thank you for this consultation.  Our [facility] hospitalist team will follow the patient with you.   Time Spent: 120 minutes   Karmen Bongo M.D. [facility] 10/09/2019, 6:22 PM

## 2019-10-09 NOTE — Anesthesia Postprocedure Evaluation (Signed)
Anesthesia Post Note  Patient: Robert Sullivan  Procedure(s) Performed: IRRIGATION AND DEBRIDEMENT, REVISION LEFT HIP WOUND (Left Hip)     Patient location during evaluation: PACU Anesthesia Type: General Level of consciousness: awake and alert Pain management: pain level controlled Vital Signs Assessment: post-procedure vital signs reviewed and stable Respiratory status: spontaneous breathing, nonlabored ventilation, respiratory function stable and patient connected to nasal cannula oxygen Cardiovascular status: blood pressure returned to baseline and stable Postop Assessment: no apparent nausea or vomiting Anesthetic complications: no    Last Vitals:  Vitals:   10/09/19 0445 10/09/19 0823  BP: (!) 99/57 92/62  Pulse:  (!) 119  Resp:  16  Temp: 36.6 C 36.6 C  SpO2:  99%    Last Pain:  Vitals:   10/09/19 0823  TempSrc: Axillary  PainSc:                  Tiajuana Amass

## 2019-10-09 NOTE — Progress Notes (Signed)
OT Cancellation Note  Patient Details Name: Robert Sullivan MRN: [id number] DOB: 04-24-1966   Cancelled Treatment:    Reason Eval/Treat Not Completed: Patient at procedure or test/ unavailable;Medical issues which prohibited therapy (Second attempt. Performing in/out cath and starting receiving blood and potassium.) Will return as schedule allows.   [place], OTR/L Acute Rehab Pager: [phone] Office: [phone] 10/09/2019, 4:08 PM

## 2019-10-09 NOTE — Progress Notes (Signed)
Patient admitted to the unit from PACU following the completion of  I&D procedure. Patient stable, but the wound Vac, was leaking. Patient came in with a unit of PRBC infusing, having received the first unit at PACU. Wound vac leakage was much, patient was bleeding from the debridement site. MD was notified. All efforts to re-enforce the dressing proved abortive,. MD notified. MD came and re-enforced the dressing. Another unit of PRBC ordered as the hgb was 7.8.

## 2019-10-09 NOTE — Progress Notes (Signed)
Pharmacy Antibiotic Note  Robert Sullivan is a 54 y.o. male admitted on 10/08/2019 with Wound infection.  Pharmacy has been consulted for Cefepime dosing. WBC remains elevated at 27.3. SCr up acutely to 2.38.   Plan: -Start Cefepime 2 gm IV Q 12 hours  -Monitor clinical progress, cultures and renal fx   Height: 6' 2"  (188 cm) Weight: (!) 148.3 kg (326 lb 15.1 oz) IBW/kg (Calculated) : 82.2  Temp (24hrs), Avg:97.9 F (36.6 C), Min:97.3 F (36.3 C), Max:98.8 F (37.1 C)  Recent Labs  Lab 10/06/19 1015 10/09/19 0101 10/09/19 1643  WBC 4.9 17.8* 27.3*  CREATININE 0.70  --  2.38*    Estimated Creatinine Clearance: 55.8 mL/min (A) (by C-G formula based on SCr of 2.38 mg/dL (H)).    Allergies  Allergen Reactions  . Sulfa Antibiotics Rash    Total body rash     Thank you for allowing pharmacy to be a part of this patient's care.  Albertina Parr, PharmD., BCPS Clinical Pharmacist Please refer to [facility] for unit-specific pharmacist

## 2019-10-09 NOTE — Progress Notes (Signed)
Pt has been tired and nauseous throughout the day shift. When ever this nurse entered the room he would be sleeping but rouse easily. Pt was pale and mouth was dry. Ondansetron given x 2 which gave pt relief. He was given Dilaudid x1 which gave him relief. He complained early that he felt short of breath but O2 sats were 99-100%. O2 at 0.5l started for pt comfort. Dr. Sharol Given gave orders to remove wound vac. D/T all the complaints/distress of the pt this nurse reached out to Dr. Sharol Given to inform him of the situation and to ask for consults to the hospitalist or  Internal medicine and to see if leaving the wound vac on would be more appropriate considering the canister had already been changed one time this shift. Dr. Sharol Given said the wound vac could stay on until surgery in the morning. He also reached out to the hospitalist who came and assessed pt at bedside. The Hospitalist gave orders for more labs as well as 2 units of RBCs. First unit on RBC given on day shift. Pharmacy sent up IV vitamin K and said it had to be given within the hour so this was hung as well after first unit. All of this was reported to night shift nurse as well as night and day charge nurses. Katherina Right RN

## 2019-10-09 NOTE — Anesthesia Preprocedure Evaluation (Addendum)
Anesthesia Evaluation  Patient identified by MRN, date of birth, ID band Patient awake    Reviewed: Allergy & Precautions, NPO status , Patient's Chart, lab work & pertinent test results  History of Anesthesia Complications Negative for: history of anesthetic complications  Airway Mallampati: III  TM Distance: >3 FB Neck ROM: Full    Dental  (+) Teeth Intact, Dental Advisory Given   Pulmonary sleep apnea , former smoker,    breath sounds clear to auscultation       Cardiovascular hypertension, Pt. on medications  Rhythm:Regular Rate:Normal  Echo 09/12/19: EF 60-65%     Neuro/Psych Anxiety Depression negative neurological ROS     GI/Hepatic negative GI ROS, (+) Cirrhosis  (AST 1944 (from 449 on 10/09/19))  ascites    , Acute on chronic liver failure   Endo/Other  diabetes, Type 2, Insulin DependentMorbid obesity  Renal/GU ARFRenal disease (Cr 2.56 on 10/10/19 (0.70 on 10/06/19), K 5.0  on 10/10/19  )  negative genitourinary   Musculoskeletal  (+) Arthritis ,   Abdominal (+) + obese,   Peds  Hematology  (+) Blood dyscrasia, anemia , Hgb 7.8, INR 2.3   Anesthesia Other Findings Day of surgery medications reviewed with patient.  Reproductive/Obstetrics negative OB ROS                          Anesthesia Physical Anesthesia Plan  ASA: III and emergent  Anesthesia Plan: General   Post-op Pain Management:    Induction: Intravenous  PONV Risk Score and Plan: Ondansetron  Airway Management Planned: LMA  Additional Equipment: None  Intra-op Plan:   Post-operative Plan: Extubation in OR  Informed Consent: I have reviewed the patients History and Physical, chart, labs and discussed the procedure including the risks, benefits and alternatives for the proposed anesthesia with the patient or authorized representative who has indicated his/her understanding and acceptance.     Dental advisory  given  Plan Discussed with: CRNA and Anesthesiologist  Anesthesia Plan Comments: (Discussed case with Dr. Sharol Given. Patient in acute liver failure with very guarded prognosis, concern for sepsis from ongoing hip wound, acute renal failure (?hepatorenal syndrome). Hypotensive overnight with multiple blood products given to attempt to correct coagulopathy/intravascular volume depletion. Needs to proceed to OR urgently for sponge removal per Dr. Sharol Given, will plan on MAC. Daiva Huge, MD  As noted, patient with acute renal failure and decompensated liver disease. Creatinine 1.69 today, improved from yesterday, ALT and AST better today. Pt denies nausea/vomiting, able to tolerate PO food yesterday. Plan GA with LMA today.)     Anesthesia Quick Evaluation

## 2019-10-09 NOTE — Transfer of Care (Signed)
Immediate Anesthesia Transfer of Care Note  Patient: Robert Sullivan  Procedure(s) Performed: IRRIGATION AND DEBRIDEMENT, REVISION LEFT HIP WOUND (Left Hip)  Patient Location: PACU  Anesthesia Type:General  Level of Consciousness: awake, alert  and oriented  Airway & Oxygen Therapy: Patient Spontanous Breathing and Patient connected to nasal cannula oxygen  Post-op Assessment: Report given to RN, Post -op Vital signs reviewed and stable and Patient moving all extremities  Post vital signs: Reviewed and stable  Last Vitals:  Vitals Value Taken Time  BP 99/57 10/09/19 0450  Temp 36.6 C 10/09/19 0445  Pulse 129 10/09/19 0814  Resp 21 10/09/19 0814  SpO2 100 % 10/09/19 0814  Vitals shown include unvalidated device data.  Last Pain:  Vitals:   10/09/19 0445  TempSrc: Oral  PainSc:       Patients Stated Pain Goal: 2 (94/94/47 3958)  Complications: No apparent anesthesia complications

## 2019-10-09 NOTE — Significant Event (Addendum)
Rapid Response Event Note  Overview: Called d/t staff concern that pt needs ICU care. Pt here with L hip abscess which has required 9 trips to OR so far for revisions/ I and Ds, wound vac changes, etc.       Initial Focused Assessment: Pt laying in bed, alert and oriented, c/o feeling tired. Skin warm and pale. Lungs CTA. T-98, HR-112(ST), BP-72/39(recheck-89/50), RR-15, SpO2-98% on 0.5L [place].  L hip wound with wound vac present-sang drainage leaking from dressing. Wound vac with approx 150cc bloody drainage in it. Per charting, pt has had 2100 cc out from wound vac since midnight. Labs: K-6,  Hgb-7.3, INR-2.5>kayexalate, 2 units PRBCs and vit K ordered previously. 1st unit PRBCs given on day shift and 2nd unit being picked up in blood bank now-RN to start immediately.   Interventions: 500cc NS bolus: (PIV infiltrated during administration-IV team to bedside, placed midline) Pt 2nd unit PRBC to be started immediately once at bedside.  Plan of Care (if not transferred): Finish 500cc bolus. Transfuse 2nd unit PRBCs. Continue to monitor pt. RN to call NP with 2300 VS and decision will be made whether stable enough to stay on 4NP. Call RRT if further assistance needed.   0023-Pt seen for hypotension-83/45, pt alert and oriented, 1L NS bolus ordered by [facility] NP and PCCM consulted. Pt will remain on 4NP and receive 2 units PRBCs, 1 unit FFP, and 1 platelet.  Event Summary: Called: 2055 Arrived: 2058 Ended:  Dillard Essex

## 2019-10-10 ENCOUNTER — Encounter (HOSPITAL_COMMUNITY): Payer: Self-pay | Admitting: Orthopedic Surgery

## 2019-10-10 ENCOUNTER — Inpatient Hospital Stay (HOSPITAL_COMMUNITY): Payer: Self-pay

## 2019-10-10 DIAGNOSIS — D689 Coagulation defect, unspecified: Secondary | ICD-10-CM

## 2019-10-10 DIAGNOSIS — N179 Acute kidney failure, unspecified: Secondary | ICD-10-CM

## 2019-10-10 DIAGNOSIS — E119 Type 2 diabetes mellitus without complications: Secondary | ICD-10-CM

## 2019-10-10 DIAGNOSIS — R748 Abnormal levels of other serum enzymes: Secondary | ICD-10-CM

## 2019-10-10 DIAGNOSIS — I959 Hypotension, unspecified: Secondary | ICD-10-CM

## 2019-10-10 DIAGNOSIS — E440 Moderate protein-calorie malnutrition: Secondary | ICD-10-CM

## 2019-10-10 DIAGNOSIS — Z881 Allergy status to other antibiotic agents status: Secondary | ICD-10-CM

## 2019-10-10 LAB — GLUCOSE, CAPILLARY
Glucose-Capillary: 160 mg/dL — ABNORMAL HIGH (ref 70–99)
Glucose-Capillary: 181 mg/dL — ABNORMAL HIGH (ref 70–99)
Glucose-Capillary: 200 mg/dL — ABNORMAL HIGH (ref 70–99)
Glucose-Capillary: 194 mg/dL — ABNORMAL HIGH (ref 70–99)

## 2019-10-10 LAB — URINALYSIS, ROUTINE W REFLEX MICROSCOPIC
Bacteria, UA: NONE SEEN
Bilirubin Urine: NEGATIVE
Glucose, UA: 50 mg/dL — AB
Ketones, ur: NEGATIVE mg/dL
Nitrite: NEGATIVE
Protein, ur: 100 mg/dL — AB
RBC / HPF: >50 RBC/hpf — ABNORMAL HIGH (ref 0–5)
Specific Gravity, Urine: 1.03 (ref 1.005–1.030)
pH: 5 (ref 5.0–8.0)

## 2019-10-10 LAB — COMPREHENSIVE METABOLIC PANEL
ALT: 668 U/L — ABNORMAL HIGH (ref 0–44)
ALT: 684 U/L — ABNORMAL HIGH (ref 0–44)
AST: 1855 U/L — ABNORMAL HIGH (ref 15–41)
AST: 1944 U/L — ABNORMAL HIGH (ref 15–41)
Albumin: 1.8 g/dL — ABNORMAL LOW (ref 3.5–5.0)
Albumin: 1.9 g/dL — ABNORMAL LOW (ref 3.5–5.0)
Alkaline Phosphatase: 109 U/L (ref 38–126)
Alkaline Phosphatase: 105 U/L (ref 38–126)
Anion gap: 11 (ref 5–15)
Anion gap: 8 (ref 5–15)
BUN: 29 mg/dL — ABNORMAL HIGH (ref 6–20)
BUN: 31 mg/dL — ABNORMAL HIGH (ref 6–20)
CO2: 17 mmol/L — ABNORMAL LOW (ref 22–32)
CO2: 21 mmol/L — ABNORMAL LOW (ref 22–32)
Calcium: 8.1 mg/dL — ABNORMAL LOW (ref 8.9–10.3)
Calcium: 7.9 mg/dL — ABNORMAL LOW (ref 8.9–10.3)
Chloride: 106 mmol/L (ref 98–111)
Chloride: 106 mmol/L (ref 98–111)
Creatinine, Ser: 2.73 mg/dL — ABNORMAL HIGH (ref 0.61–1.24)
Creatinine, Ser: 2.56 mg/dL — ABNORMAL HIGH (ref 0.61–1.24)
GFR calc Af Amer: 30 mL/min — ABNORMAL LOW (ref >60)
GFR calc Af Amer: 32 mL/min — ABNORMAL LOW (ref >60)
GFR calc non Af Amer: 26 mL/min — ABNORMAL LOW (ref >60)
GFR calc non Af Amer: 28 mL/min — ABNORMAL LOW (ref >60)
Glucose, Bld: 195 mg/dL — ABNORMAL HIGH (ref 70–99)
Glucose, Bld: 172 mg/dL — ABNORMAL HIGH (ref 70–99)
Potassium: 5.9 mmol/L — ABNORMAL HIGH (ref 3.5–5.1)
Potassium: 5 mmol/L (ref 3.5–5.1)
Sodium: 134 mmol/L — ABNORMAL LOW (ref 135–145)
Sodium: 135 mmol/L (ref 135–145)
Total Bilirubin: 2.9 mg/dL — ABNORMAL HIGH (ref 0.3–1.2)
Total Bilirubin: 2.5 mg/dL — ABNORMAL HIGH (ref 0.3–1.2)
Total Protein: 4.4 g/dL — ABNORMAL LOW (ref 6.5–8.1)
Total Protein: 4.3 g/dL — ABNORMAL LOW (ref 6.5–8.1)

## 2019-10-10 LAB — CBC
HCT: 23.6 % — ABNORMAL LOW (ref 39.0–52.0)
HCT: 18.5 % — ABNORMAL LOW (ref 39.0–52.0)
Hemoglobin: 7.8 g/dL — ABNORMAL LOW (ref 13.0–17.0)
Hemoglobin: 6.1 g/dL — CL (ref 13.0–17.0)
MCH: 28.8 pg (ref 26.0–34.0)
MCH: 28.5 pg (ref 26.0–34.0)
MCHC: 33.1 g/dL (ref 30.0–36.0)
MCHC: 33 g/dL (ref 30.0–36.0)
MCV: 87.1 fL (ref 80.0–100.0)
MCV: 86.4 fL (ref 80.0–100.0)
Platelets: 167 10*3/uL (ref 150–400)
Platelets: UNDETERMINED 10*3/uL (ref 150–400)
RBC: 2.71 MIL/uL — ABNORMAL LOW (ref 4.22–5.81)
RBC: 2.14 MIL/uL — ABNORMAL LOW (ref 4.22–5.81)
RDW: 16.9 % — ABNORMAL HIGH (ref 11.5–15.5)
RDW: 17.2 % — ABNORMAL HIGH (ref 11.5–15.5)
WBC: 19 10*3/uL — ABNORMAL HIGH (ref 4.0–10.5)
WBC: 10.6 10*3/uL — ABNORMAL HIGH (ref 4.0–10.5)
nRBC: 0 % (ref 0.0–0.2)
nRBC: 0 % (ref 0.0–0.2)

## 2019-10-10 LAB — SURGICAL PCR SCREEN
MRSA, PCR: NEGATIVE
Staphylococcus aureus: NEGATIVE

## 2019-10-10 LAB — PROCALCITONIN: Procalcitonin: 1.27 ng/mL

## 2019-10-10 LAB — PREPARE RBC (CROSSMATCH)

## 2019-10-10 LAB — PROTIME-INR
INR: 2.5 — ABNORMAL HIGH (ref 0.8–1.2)
INR: 2.3 — ABNORMAL HIGH (ref 0.8–1.2)
INR: 2.1 — ABNORMAL HIGH (ref 0.8–1.2)
Prothrombin Time: 27.2 seconds — ABNORMAL HIGH (ref 11.4–15.2)
Prothrombin Time: 25.3 seconds — ABNORMAL HIGH (ref 11.4–15.2)
Prothrombin Time: 23 seconds — ABNORMAL HIGH (ref 11.4–15.2)

## 2019-10-10 LAB — NA AND K (SODIUM & POTASSIUM), RAND UR
Potassium Urine: 68 mmol/L
Sodium, Ur: <10 mmol/L

## 2019-10-10 LAB — LACTIC ACID, PLASMA
Lactic Acid, Venous: 4.9 mmol/L (ref 0.5–1.9)
Lactic Acid, Venous: 1.9 mmol/L (ref 0.5–1.9)

## 2019-10-10 LAB — CORTISOL: Cortisol, Plasma: 20.5 ug/dL

## 2019-10-10 LAB — HEMOGLOBIN AND HEMATOCRIT, BLOOD
HCT: 22.3 % — ABNORMAL LOW (ref 39.0–52.0)
Hemoglobin: 7.4 g/dL — ABNORMAL LOW (ref 13.0–17.0)

## 2019-10-10 LAB — CREATININE, URINE, RANDOM: Creatinine, Urine: 113.06 mg/dL

## 2019-10-10 LAB — OSMOLALITY, URINE: Osmolality, Ur: 320 mOsm/kg (ref 300–900)

## 2019-10-10 IMAGING — US US RENAL
1 series · 14 of 25 positions shown · non-contrast
Comparison: Abdominopelvic CT [DATE]

CLINICAL DATA: Acute renal failure.

EXAM:
RENAL / URINARY TRACT ULTRASOUND COMPLETE

[Series 1: us renal · 14 of 28 slices shown]
[im 1/28]
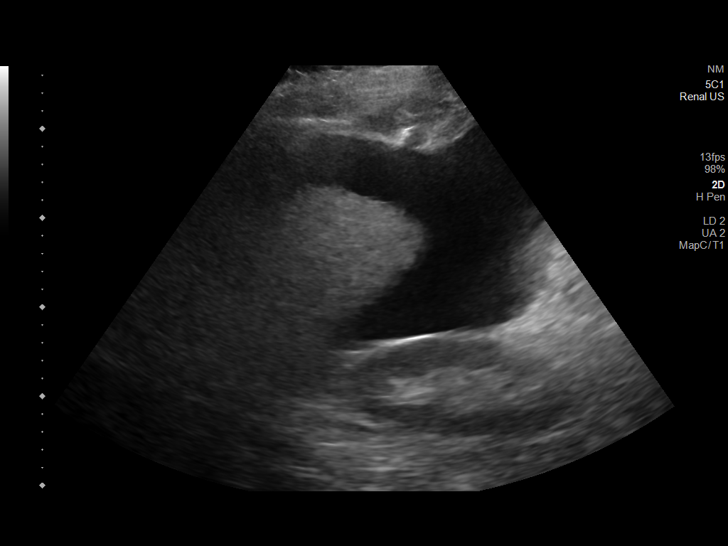
[im 3/28]
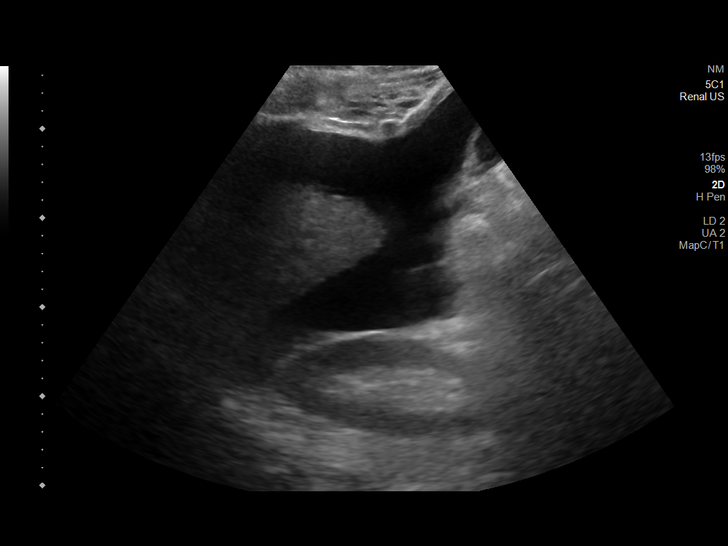
[im 5/28]
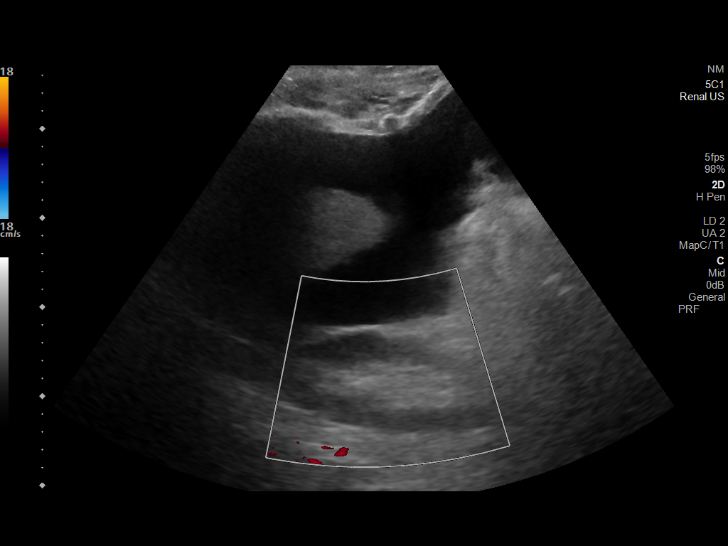
[im 7/28]
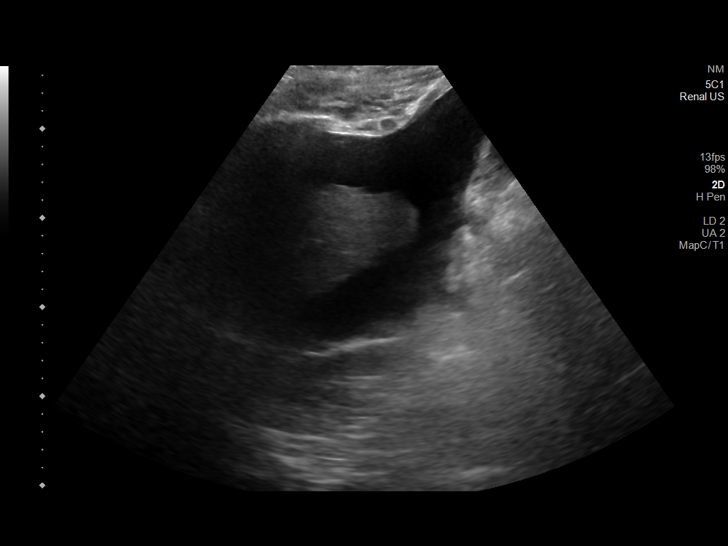
[im 10/28]
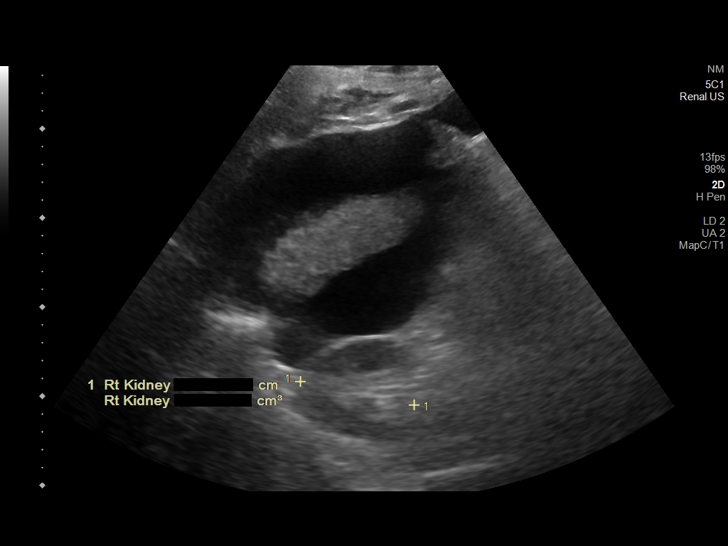
[im 11/28]
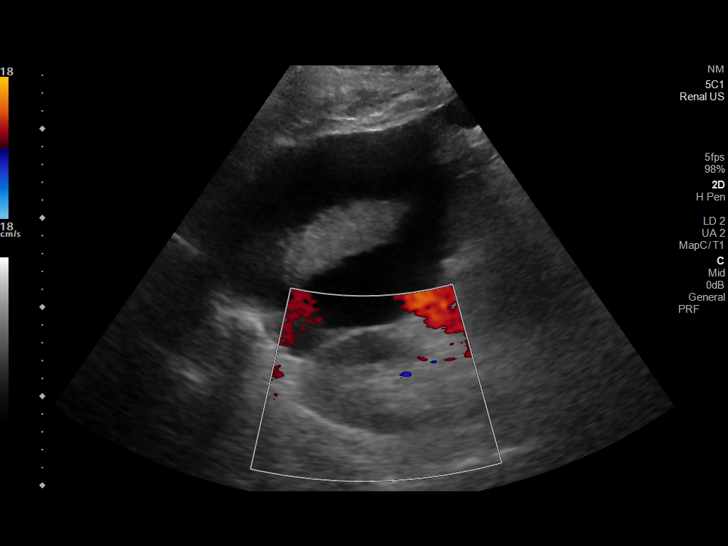
[im 13/28]
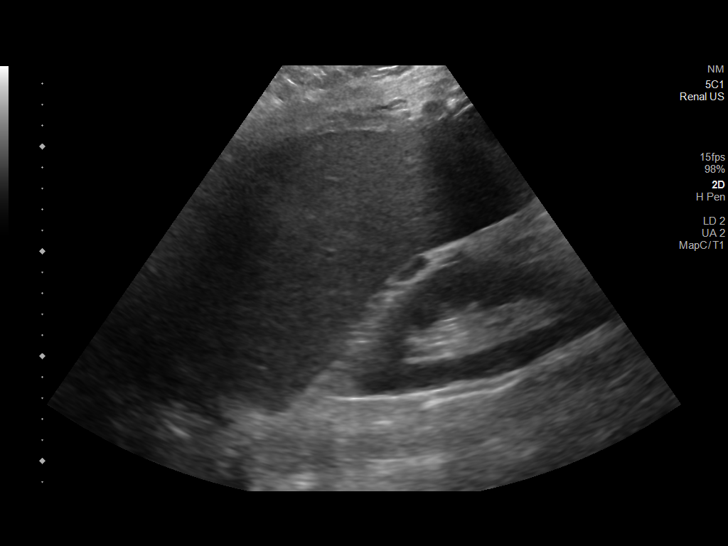
[im 15/28]
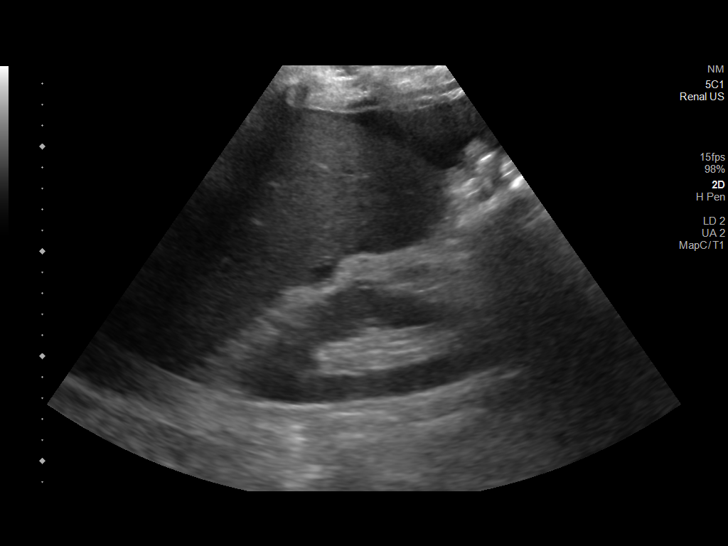
[im 17/28]
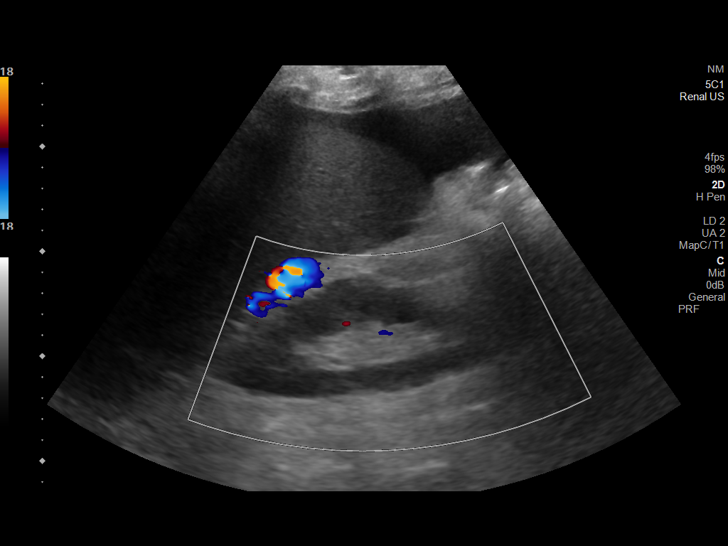
[im 19/28]
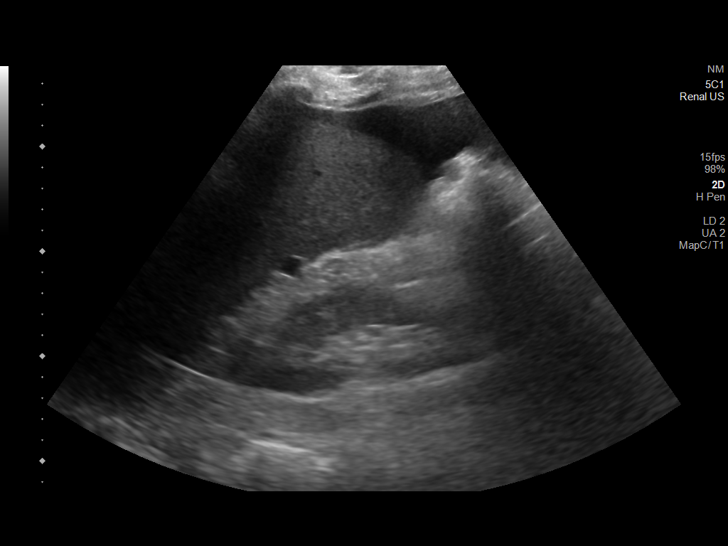
[im 21/28]
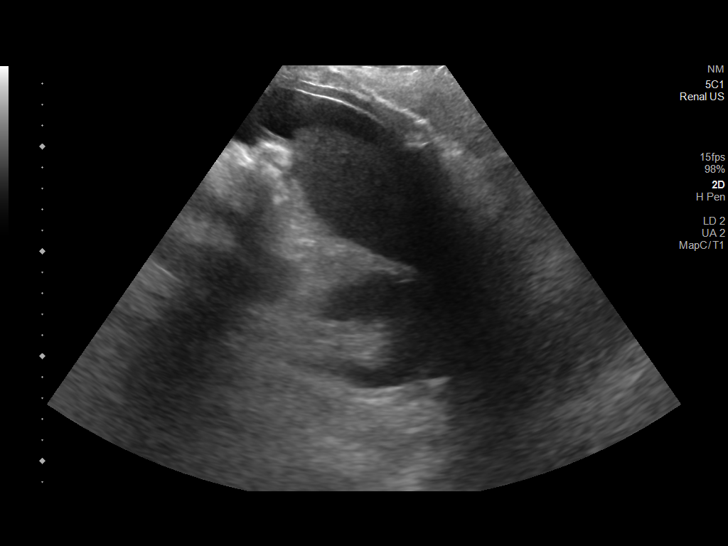
[im 23/28]
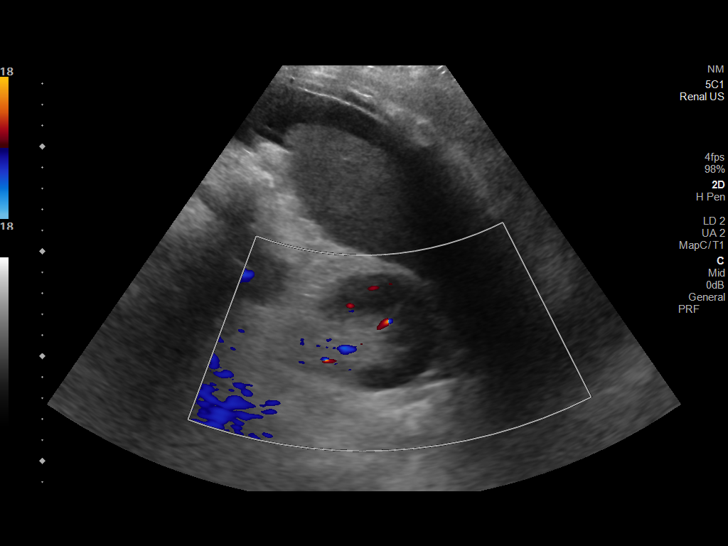
[im 25/28]
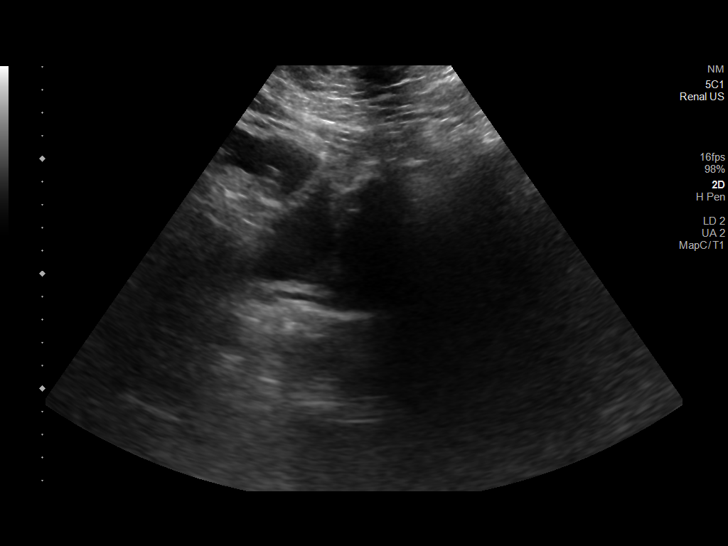
[im 28/28]
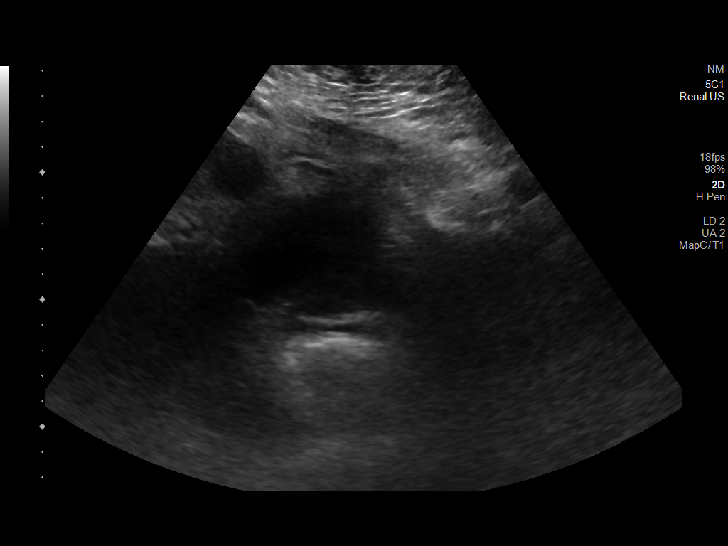

[14 of 25 positions shown; findings below may reference images not displayed]

FINDINGS: Right Kidney:

Renal measurements: 12.8 x 5.6 x 6.5 cm = volume: 245 mL.
Echogenicity within normal limits. No mass or hydronephrosis
visualized.

Left Kidney:

Renal measurements: 13.3 x 5.9 x 4.8 cm = volume: 193 mL.
Echogenicity within normal limits. No mass or hydronephrosis
visualized.

Bladder:

Only minimally distended.  Neither ureteral jet is visualized.

Other:

Small-moderate volume abdominopelvic ascites. Decreased ascites from
[DATE] abdominal CT.
IMPRESSION: 1. Unremarkable sonographic appearance of the kidneys. No
obstructive uropathy.
2. Urinary bladder poorly distended, not well evaluated.
3. Incidental abdominopelvic ascites, diminished ascites from CT
last month.

## 2019-10-10 MED ORDER — SODIUM CHLORIDE 0.9% IV SOLUTION: Freq: Once | INTRAVENOUS | Status: AC

## 2019-10-10 MED ORDER — ALBUMIN HUMAN 25 % IV SOLN
12.5000 g | Freq: Once | INTRAVENOUS | Status: AC
  Administered 2019-10-10: 04:00:00 12.5 g via INTRAVENOUS
  Filled 2019-10-10: qty 50

## 2019-10-10 MED ORDER — SODIUM CHLORIDE 0.9 % IV BOLUS: 500.0000 mL | Freq: Once | INTRAVENOUS | Status: DC

## 2019-10-10 MED ORDER — PROPOFOL 10 MG/ML IV BOLUS
INTRAVENOUS | Status: AC
  Filled 2019-10-10: qty 40

## 2019-10-10 MED ORDER — SODIUM CHLORIDE 0.9 % IV BOLUS
1000.0000 mL | Freq: Once | INTRAVENOUS | Status: AC
  Administered 2019-10-10: 1000 mL via INTRAVENOUS

## 2019-10-10 MED ORDER — SODIUM BICARBONATE 8.4 % IV SOLN
INTRAVENOUS | Status: DC
  Filled 2019-10-10 (×2): qty 150

## 2019-10-10 MED ORDER — SODIUM ZIRCONIUM CYCLOSILICATE 10 G PO PACK
10.0000 g | PACK | Freq: Once | ORAL | Status: DC
  Filled 2019-10-10: qty 1

## 2019-10-10 MED ORDER — MIDAZOLAM HCL 2 MG/2ML IJ SOLN
INTRAMUSCULAR | Status: AC
  Filled 2019-10-10: qty 2

## 2019-10-10 MED ORDER — FENTANYL CITRATE (PF) 250 MCG/5ML IJ SOLN
INTRAMUSCULAR | Status: AC
  Filled 2019-10-10: qty 5

## 2019-10-10 MED ORDER — ALBUMIN HUMAN 25 % IV SOLN
12.5000 g | Freq: Four times a day (QID) | INTRAVENOUS | Status: AC
  Administered 2019-10-10 (×2): 12.5 g via INTRAVENOUS
  Filled 2019-10-10 (×3): qty 50

## 2019-10-10 MED ORDER — SODIUM CHLORIDE 0.9 % IV SOLN: 250.0000 mL | INTRAVENOUS | Status: DC

## 2019-10-10 NOTE — Consult Note (Addendum)
NAME:  Robert Sullivan, MRN:  [id number], DOB:  09-01-1965, LOS: 2 ADMISSION DATE:  10/08/2019, CONSULTATION DATE:  10/10/2019 REFERRING MD:  Lennox Grumbles, NP, CHIEF COMPLAINT:  Hypotension  Brief History   54 yo male with recurrent left gluteal absecess s/p 9 I&Ds with hypotension.  Wound vac in place which has been leaking per staff.  Ongoing anemia and coagulopathy.  Scheduled to return to OR this am.  Mental status remains intact.  PCCM consulted for possible transfer to ICU for hypotension.   History of present illness    54 year old male with prior history of OSA, HTN, HLD, DM, morbid obesity, depression, NASH cirrhosis.  He has had a long and complicated history since November 2020.  Patient sustained a fall on 11/1 and was evaluated and discharged home from ER.  He then returned 11/2-7 with E. Coli urosepsis treated with ceftriaxone.  Underwent a MRI which showed a tear of the left gluteus maximus with small hematoma (50 ml).  Hospitalized again 12/1- 21 with MSSA in urine, blood, and wound cultures and treated with 6 weeks of ancef; TEE negative.  Had IR placed drain 12/8 and then underwent I&D 12/14 and 12/16.  Then hospitalized again 12/30 to 1/15 for recurrent left hip abscess with I&D on 12/30 with wound cx enterobacter cloacae and and 1/2 with wound cx 1/6 of Enterococcus faecalis, with additional I&D 1/8 discharged home on cefepime for 4 weeks.  Patient states his wound was doing great but then readmitted 3/4 to 3/18 due to decompensated liver failure with anasarca. During this time, left hip wound aspiration 3/6 positive again for Enterococcus faecalis with I&D 3/12 with placement of wound vac and 3 weeks of ampicillin.  Was seen by [facility] on 3/4 for his liver issues.  Underwent paracentesis 3/6 with 9L removed.  Unfortunately due to wound dehiscence, he was readmitted 3/31 and underwent I&D.  Since, his wound vac has been leaking blood due to partially clogged wound vac.  His hemoglobins have been  trending low, he since has been transfused 2 units of blood 4/1 PM.  He has developed a new AKI with oliguria and elevated INR.  He is scheduled to return to OR this morning for his 10th I&D.  Since his transfusions, his blood pressure continues to be in the systolic 38-18'E and his mental status remains intact.  Patient complains of poor sleep for 3 days and intermittent left leg pain in which his pain medications help with.  Wife at bedside and patient both state their frustration with plan of care, multiple surgeries, and lack of workup with liver.  Given patient's hypotension, PCCM was consulted for possible ICU transfer.    Past Medical History  OSA, HTN, HLD, DM, morbid obesity, depression, NASH cirrhosis  Significant Hospital Events   3/31 Admit/ I&D  Consults:  ID PCCM  Procedures:  3/31 I&D left hip wound  Significant Diagnostic Tests:   Micro Data:  3/31 SARS 2/ Flu A/B >> neg 3/31 left hip wound GS >> 3/31 left hip wound cx >> GNR >> 4/2 BCx2 >>  Antimicrobials:  3/31 ampicillin >> 4/1 3/31 cefazolin preop 4/1 cefepime >>  Interim history/subjective:    Objective   Blood pressure (!) 83/45, pulse (!) 108, temperature 98 F (36.7 C), temperature source Oral, resp. rate 16, height 6' 2" (1.88 m), weight (!) 148.3 kg, SpO2 97 %.        Intake/Output Summary (Last 24 hours) at 10/10/2019 0143 Last data  filed at 10/09/2019 2345 Gross per 24 hour  Intake 2195 ml  Output 1230 ml  Net 965 ml   Filed Weights   10/08/19 1140  Weight: (!) 148.3 kg   Examination: General:  Pleasant morbidly obese adult male lying in bed in NAD  HEENT: MM pink/moist, pupils 4/reactive Neuro: alert, oriented x 3, MAE CV: rr, mild ST, no murmur PULM:  Non labored, clear throughout  GI: obese, +bs Extremities: warm/dry, large left hip/gluteal lateral wound vac in place with some serosanguinous drainage and pooling under wound vac, leaking in spots- foul smelling Skin: no rashes    Resolved Hospital Problem list    Assessment & Plan:   Hypotension - possibly multifactorial - ABLA from leaking left hip wound vac/ coagulopathy +/- sepsis +/- worsening metabolic acidosis from suspected worsening AKI P: Continue care in PCU Mental status remains intact/ unchanged Goal MAP > 65 Check CBC, CMET, and INR s/p 2 units PRBC Additional FFP, platelets, and albumin x 1 now Transfuse for Hgb <7 BCx 2 sent abx broadened to cefepime 4/1 Follow wound cultures  ID following  Trend WBC/ Fever curve  Check cortisol  Strict I/O's, trend renal function    Recurrent left gluteal abscess with wound dehiscence P:  Per Dr. Sharol Given Plans for repeat I&D this am in OR Follow wound cx    AKI/ oliguria P:  Ongoing IVF- NS 125 ml/hr Goal MAP > 65 Strict I/Os   Remainder per primary team.  PCCM will follow along.    Best practice:  Diet: NPO for surgery  Pain/Anxiety/Delirium protocol (if indicated): prn dilaudid VAP protocol (if indicated): n/a DVT prophylaxis: SCDs GI prophylaxis: n/a Glucose control: SSI Mobility: BR Code Status: Full  Family Communication: patient and wife updated at bedside Disposition: PCU  Labs   CBC: Recent Labs  Lab 10/06/19 1015 10/08/19 1816 10/09/19 0101 10/09/19 1129 10/09/19 1643  WBC 4.9  --  17.8*  --  27.3*  NEUTROABS 3.3  --   --   --   --   HGB 8.4* 6.9* 7.8* 7.3* 7.3*  HCT 27.2* 23.0* 24.6* 22.7* 23.0*  MCV 85.5  --  84.5  --  87.8  PLT 104*  --  238  --  627    Basic Metabolic Panel: Recent Labs  Lab 10/06/19 1015 10/09/19 1643  NA 136 134*  K 3.5 6.0*  CL 104 102  CO2 25 14*  GLUCOSE 113* 149*  BUN 15 26*  CREATININE 0.70 2.38*  CALCIUM 7.9* 8.3*   GFR: Estimated Creatinine Clearance: 55.8 mL/min (A) (by C-G formula based on SCr of 2.38 mg/dL (H)). Recent Labs  Lab 10/06/19 1015 10/09/19 0101 10/09/19 1643  WBC 4.9 17.8* 27.3*    Liver Function Tests: Recent Labs  Lab 10/09/19 1643  AST 449*   ALT 178*  ALKPHOS 91  BILITOT 2.8*  PROT 4.6*  ALBUMIN 1.9*   No results for input(s): LIPASE, AMYLASE in the last 168 hours. No results for input(s): AMMONIA in the last 168 hours.  ABG    Component Value Date/Time   HCO3 24.5 07/09/2019 1832   TCO2 19 (L) 07/16/2019 1352   ACIDBASEDEF 1.0 07/09/2019 1832   O2SAT 72.0 07/09/2019 1832     Coagulation Profile: Recent Labs  Lab 10/09/19 1643  INR 2.5*    Cardiac Enzymes: No results for input(s): CKTOTAL, CKMB, CKMBINDEX, TROPONINI in the last 168 hours.  HbA1C: Hgb A1c MFr Bld  Date/Time Value Ref Range Status  09/12/2019 11:34 AM 4.6 (L) 4.8 - 5.6 % Final    Comment:    (NOTE) Pre diabetes:          5.7%-6.4% Diabetes:              >6.4% Glycemic control for   <7.0% adults with diabetes   07/10/2019 12:36 AM 5.5 4.8 - 5.6 % Final    Comment:    (NOTE) Pre diabetes:          5.7%-6.4% Diabetes:              >6.4% Glycemic control for   <7.0% adults with diabetes     CBG: Recent Labs  Lab 10/08/19 1137 10/08/19 1400 10/09/19 1727 10/09/19 2356  GLUCAP 105* 94 127* 191*    Review of Systems:   As per HPI otherwise negative  Past Medical History  He,  has a past medical history of Anemia, Anxiety, Arthritis (05/13/2019), Cirrhosis ([place]), Depression, Diabetes mellitus without complication ([place]), DVT (deep venous thrombosis) ([place]), Gallstones, Gluteal abscess, Headache, HTN (hypertension), Hyperlipidemia, Sleep apnea, and Splenomegaly.   Surgical History    Past Surgical History:  Procedure Laterality Date  . I & D EXTREMITY Left 07/16/2019   Procedure: LEFT HIP DEBRIDEMENT;  Surgeon: Newt Minion, MD;  Location: [place];  Service: Orthopedics;  Laterality: Left;  . I & D EXTREMITY Left 07/18/2019   Procedure: REPEAT IRRIGATION AND DEBRIDEMENT LEFT HIP;  Surgeon: Newt Minion, MD;  Location: [place];  Service: Orthopedics;  Laterality: Left;  . I & D EXTREMITY Left 09/19/2019   Procedure: DEBRIDEMENT  LEFT HIP Appilcattion OF WOUND VAC;  Surgeon: Newt Minion, MD;  Location: [place];  Service: Orthopedics;  Laterality: Left;  . I & D EXTREMITY Left 10/08/2019   Procedure: IRRIGATION AND DEBRIDEMENT, REVISION LEFT HIP WOUND;  Surgeon: Newt Minion, MD;  Location: [place];  Service: Orthopedics;  Laterality: Left;  . INCISION AND DRAINAGE ABSCESS Left 06/23/2019   Procedure: INCISION AND DRAINAGE GLUTEAL ABSCESS;  Surgeon: Renette Butters, MD;  Location: [place];  Service: Orthopedics;  Laterality: Left;  . INCISION AND DRAINAGE ABSCESS Left 07/09/2019   Procedure: INCISION AND DRAINAGE LEFT HIP/PELVIC ABSCESS, APPLICATION OF NEGATIVE PRESSURE WOUND VAC;  Surgeon: Newt Minion, MD;  Location: [place];  Service: Orthopedics;  Laterality: Left;  . INCISION AND DRAINAGE HIP Left 06/25/2019   Procedure: IRRIGATION AND DEBRIDEMENT HIP;  Surgeon: Shona Needles, MD;  Location: [place];  Service: Orthopedics;  Laterality: Left;  . INCISION AND DRAINAGE OF WOUND Left 07/12/2019   Procedure: LEFT HIP DEBRIDEMENT;  Surgeon: Newt Minion, MD;  Location: [place];  Service: Orthopedics;  Laterality: Left;  . IR PARACENTESIS  09/13/2019  . IR US GUIDE BX ASP/DRAIN  06/17/2019  . IR US GUIDE BX ASP/DRAIN  09/13/2019  . KNEE ARTHROSCOPY Bilateral    left x 2, 1 right  . LAPAROSCOPIC CHOLECYSTECTOMY  ~ 2010  . LUMBAR DISC SURGERY    . NASAL SINUS SURGERY    . TEE WITHOUT CARDIOVERSION N/A 06/16/2019   Procedure: TRANSESOPHAGEAL ECHOCARDIOGRAM (TEE);  Surgeon: Dorothy Spark, MD;  Location: [facility];  Service: Cardiovascular;  Laterality: N/A;     Social History   Addendum: former smoker.  No prior ETOH or drug use  Family History   His family history includes Hypertension in his father.   Allergies Allergies  Allergen Reactions  . Sulfa Antibiotics Rash and Other (See Comments)  Total body rash     Home Medications  Prior to Admission medications   Medication Sig Start Date End Date Taking?  Authorizing Provider  aspirin 325 MG tablet Take 1 tablet (325 mg total) by mouth daily. Patient taking differently: Take 325 mg by mouth as needed for mild pain or headache.  07/25/19  Yes Persons, Mary Anne, PA  Ensure Max Protein (ENSURE MAX PROTEIN) LIQD Take 330 mLs (11 oz total) by mouth daily. 07/01/19  Yes Hongalgi, Anand D, MD  ferrous sulfate 325 (65 FE) MG tablet Take 1 tablet (325 mg total) by mouth daily with breakfast. 09/24/19 10/24/19 Yes Kyle, Tyrone A, DO  furosemide (LASIX) 40 MG tablet Take 1 tablet (40 mg total) by mouth daily. 09/24/19 10/24/19 Yes Kyle, Tyrone A, DO  insulin aspart (NOVOLOG) 100 UNIT/ML injection Inject 1 Units into the skin 2 (two) times daily as needed (only if BGL is 120 or greater).    Yes [provider]  insulin glargine (LANTUS) 100 UNIT/ML injection Inject 0.24 mLs (24 Units total) into the skin daily. Patient taking differently: Inject 15 Units into the skin daily as needed (only if BGL is 120 or greater).  07/01/19  Yes Hongalgi, Anand D, MD  Multiple Vitamin (MULTIVITAMIN WITH MINERALS) TABS tablet Take 1 tablet by mouth daily. 07/01/19  Yes Hongalgi, Anand D, MD  naproxen sodium (ALEVE) 220 MG tablet Take 220-440 mg by mouth 2 (two) times daily as needed (for pain).   Yes [provider]  potassium chloride SA (KLOR-CON) 20 MEQ tablet Take 2 tablets (40 mEq total) by mouth daily. 09/25/19 10/25/19 Yes Kyle, Tyrone A, DO  spironolactone (ALDACTONE) 100 MG tablet Take 1 tablet (100 mg total) by mouth daily. 09/25/19 10/25/19 Yes Kyle, Tyrone A, DO  blood glucose meter kit and supplies KIT Dispense based on patient and insurance preference. Use up to four times daily as directed. (FOR ICD-9 250.00, 250.01). 06/30/19   Hongalgi, Anand D, MD  Insulin Syringes, Disposable, U-100 0.5 ML MISC Use as directed 3 times daily AC. 06/30/19   Hongalgi, Anand D, MD      Brooke Simpson, MSN, AGACNP-BC [facility] 10/10/2019, 4:22  AM           

## 2019-10-10 NOTE — Progress Notes (Signed)
CRITICAL VALUE ALERT  Critical Value:  6.1 Hb   Date & Time Notied:  10/10/19 1015  Provider Notified: Dr. Loleta Books   Orders Received/Actions taken: 6728 1 unit PRBC & 1 unit cryoprecipitate

## 2019-10-10 NOTE — Progress Notes (Signed)
NAME:  Robert Sullivan, MRN:  [id number], DOB:  09-05-1965, LOS: 2 ADMISSION DATE:  10/08/2019, CONSULTATION DATE: 10/10/2019 REFERRING MD: Caroline Sauger CHIEF COMPLAINT: Hypotension  Brief History   54 yo male with recurrent left gluteal absecess s/p 9 I&Ds with hypotension.  Wound vac in place which has been leaking per staff.  Ongoing anemia and coagulopathy.  Scheduled to return to OR this am.  Mental status remains intact.  PCCM consulted for possible transfer to ICU for hypotension.   History of present illness   54 year old male with prior history of OSA, HTN, HLD, DM, morbid obesity, depression, NASH cirrhosis.  He has had a long and complicated history since November 2020.  Patient sustained a fall on 11/1 and was evaluated and discharged home from ER.  He then returned 11/2-7 with E. Coli urosepsis treated with ceftriaxone.  Underwent a MRI which showed a tear of the left gluteus maximus with small hematoma (50 ml).  Hospitalized again 12/1- 21 with MSSA in urine, blood, and wound cultures and treated with 6 weeks of ancef; TEE negative.  Had IR placed drain 12/8 and then underwent I&D 12/14 and 12/16.  Then hospitalized again 12/30 to 1/15 for recurrent left hip abscess with I&D on 12/30 with wound cx enterobacter cloacae and and 1/2 with wound cx 1/6 of Enterococcus faecalis, with additional I&D 1/8 discharged home on cefepime for 4 weeks.  Patient states his wound was doing great but then readmitted 3/4 to 3/18 due to decompensated liver failure with anasarca. During this time, left hip wound aspiration 3/6 positive again for Enterococcus faecalis with I&D 3/12 with placement of wound vac and 3 weeks of ampicillin.  Was seen by [facility] on 3/4 for his liver issues.  Underwent paracentesis 3/6 with 9L removed.  Unfortunately due to wound dehiscence, he was readmitted 3/31 and underwent I&D.  Since, his wound vac has been leaking blood due to partially clogged wound vac.  His hemoglobins have been  trending low, he since has been transfused 2 units of blood 4/1 PM.  He has developed a new AKI with oliguria and elevated INR.  He is scheduled to return to OR this morning for his 10th I&D.  Since his transfusions, his blood pressure continues to be in the systolic 85-46'E and his mental status remains intact.  Patient complains of poor sleep for 3 days and intermittent left leg pain in which his pain medications help with.  Wife at bedside and patient both state their frustration with plan of care, multiple surgeries, and lack of workup with liver.  Given patient's hypotension, PCCM was consulted for possible ICU transfer.    Past Medical History  OSA, HTN, HLD, DM, morbid obesity, depression, NASH cirrhosis  Significant Hospital Events   3/31 Admit/ I&D  Consults:  ID PCCM  Procedures:  3/31 I&D left hip wound  Significant Diagnostic Tests:    Micro Data:  3/31 SARS 2/ Flu A/B >> neg 3/31 left hip wound GS >> 3/31 left hip wound cx >> GNR >> 4/2 BCx2 >>  Antimicrobials:  3/31 ampicillin >> 4/1 3/31 cefazolin preop 4/1 cefepime >>  Interim history/subjective:  Feels fatigued Plan is for intervention this morning-I&D  Objective   Blood pressure (!) 104/50, pulse (!) 103, temperature 97.7 F (36.5 C), temperature source Oral, resp. rate 15, height 6' 2"  (1.88 m), weight (!) 148.3 kg, SpO2 97 %.        Intake/Output Summary (Last 24 hours) at 10/10/2019  [place] filed at 10/10/2019 9735 Gross per 24 hour  Intake 2181 ml  Output 675 ml  Net 1506 ml   Filed Weights   10/08/19 1140  Weight: (!) 148.3 kg    Examination: General: Pleasant, morbidly obese HENT: Moist oral mucosa Lungs: Clear breath sounds Cardiovascular: S1, S2 Abdomen: Bowel sounds appreciated  Resolved Hospital Problem list     Assessment & Plan:  Hypotension -Likely related to blood loss -MAP being maintained over 65 -Goal of resuscitation is to maintain MAP over 65 which is currently  being achieved  Sepsis -Infectious disease following -On ampicillin, cefazolin  Recurrent left gluteal abscess with wound dehiscence -Plan is for I&D in OR -Follow wound cultures  Acute kidney injury -Maintain MAP greater than 65 -Strict I's and O's  Anemia secondary to blood loss -Transfuse per protocol  Sherrilyn Rist, MD [place] [facility] Pager: [phone]

## 2019-10-10 NOTE — Progress Notes (Signed)
Patient ID: Robert Sullivan, male   DOB: August 05, 1965, 54 y.o.   MRN: [id number]         [facility]  Date of Admission:  10/08/2019   Total days of antibiotics 22        Day 1 cefepime         ASSESSMENT: He has a very large left hip wound with extremely poor wound healing given his underlying medical conditions.  He is not entirely clear if the gram-negative rod is growing is a simple colonizer or a symptomatic pathogen but I certainly would recommend treating at this point.  Unfortunately he is developing multiorgan failure.  PLAN: 1. Continue cefepime pending final culture results 2. Gastroenterology and nephrology consults today 3. I will follow this weekend  Principal Problem:   Wound dehiscence Active Problems:   Type 2 diabetes mellitus without complication (HCC)   Obesity, Class III, BMI 40-49.9 (morbid obesity) (HCC)   Cirrhosis of liver with ascites (HCC)   Abscess, gluteal, left   Anemia   AKI (acute kidney injury) ([place])   Scheduled Meds: . sodium chloride   Intravenous Once  . Chlorhexidine Gluconate Cloth  6 each Topical Daily  . ferrous sulfate  325 mg Oral Q breakfast  . insulin aspart  0-15 Units Subcutaneous TID WC  . Ensure Max Protein  11 oz Oral Daily  . sodium chloride flush  10-40 mL Intracatheter Q12H  . sodium zirconium cyclosilicate  10 g Oral Once   Continuous Infusions: . sodium chloride 125 mL/hr at 10/10/19 0246  . sodium chloride    .  ceFAZolin (ANCEF) IV    . ceFEPime (MAXIPIME) IV    .  sodium bicarbonate  infusion 1000 mL 50 mL/hr at 10/10/19 0637   PRN Meds:.HYDROmorphone (DILAUDID) injection, metoCLOPramide **OR** metoCLOPramide (REGLAN) injection, ondansetron **OR** ondansetron (ZOFRAN) IV, oxyCODONE, sodium chloride flush, zolpidem   SUBJECTIVE: He developed hypotension overnight and now has markedly elevated liver enzymes, coagulopathy and acute kidney injury.  Left hip operative culture is growing gram  negative rods.  I discussed the case at length with Dr. Sharol Given and Dr. Lorin Mercy yesterday. I have consulted Dr. Lizbeth Bark for a second gastroenterology opinion.  Review of Systems: Review of Systems  Constitutional: Negative for fever.    Allergies  Allergen Reactions  . Sulfa Antibiotics Rash and Other (See Comments)    Total body rash    OBJECTIVE: Vitals:   10/10/19 0500 10/10/19 0515 10/10/19 0700 10/10/19 0751  BP: (!) 83/48 (!) 102/43 (!) 94/47 (!) 104/50  Pulse: 98 (!) 107 (!) 104 (!) 103  Resp: 14 16 16 15   Temp: 98.5 F (36.9 C) 99 F (37.2 C) 98.6 F (37 C) 97.7 F (36.5 C)  TempSrc: Oral Oral Oral Oral  SpO2: 96% 96% 96% 97%  Weight:      Height:       Body mass index is 41.98 kg/m.  Physical Exam Constitutional:      Comments: He appears weak, tired and frustrated.  He frequently defers to his wife, who is at the bedside, to answer questions.  He is pale.     Lab Results Lab Results  Component Value Date   WBC 19.0 (H) 10/10/2019   HGB 7.8 (L) 10/10/2019   HCT 23.6 (L) 10/10/2019   MCV 87.1 10/10/2019   PLT 167 10/10/2019    Lab Results  Component Value Date   CREATININE 2.56 (H) 10/10/2019   BUN 31 (H) 10/10/2019  NA 135 10/10/2019   K 5.0 10/10/2019   CL 106 10/10/2019   CO2 21 (L) 10/10/2019    Lab Results  Component Value Date   ALT 684 (H) 10/10/2019   AST 1,944 (H) 10/10/2019   ALKPHOS 105 10/10/2019   BILITOT 2.5 (H) 10/10/2019     Microbiology: Recent Results (from the past 240 hour(s))  Respiratory Panel by RT PCR (Flu A&B, Covid) - Nasopharyngeal Swab     Status: None   Collection Time: 10/08/19 11:41 AM   Specimen: Nasopharyngeal Swab  Result Value Ref Range Status   SARS Coronavirus 2 by RT PCR NEGATIVE NEGATIVE Final    Comment: (NOTE) SARS-CoV-2 target nucleic acids are NOT DETECTED. The SARS-CoV-2 RNA is generally detectable in upper respiratoy specimens during the acute phase of infection. The lowest concentration of  SARS-CoV-2 viral copies this assay can detect is 131 copies/mL. A negative result does not preclude SARS-Cov-2 infection and should not be used as the sole basis for treatment or other patient management decisions. A negative result may occur with  improper specimen collection/handling, submission of specimen other than nasopharyngeal swab, presence of viral mutation(s) within the areas targeted by this assay, and inadequate number of viral copies (<131 copies/mL). A negative result must be combined with clinical observations, patient history, and epidemiological information. The expected result is Negative. Fact Sheet for Patients:  PinkCheek.be Fact Sheet for Healthcare Providers:  GravelBags.it This test is not yet ap proved or cleared by the [place] FDA and  has been authorized for detection and/or diagnosis of SARS-CoV-2 by FDA under an Emergency Use Authorization (EUA). This EUA will remain  in effect (meaning this test can be used) for the duration of the COVID-19 declaration under Section 564(b)(1) of the Act, 21 U.S.C. section 360bbb-3(b)(1), unless the authorization is terminated or revoked sooner.    Influenza A by PCR NEGATIVE NEGATIVE Final   Influenza B by PCR NEGATIVE NEGATIVE Final    Comment: (NOTE) The Xpert Xpress SARS-CoV-2/FLU/RSV assay is intended as an aid in  the diagnosis of influenza from Nasopharyngeal swab specimens and  should not be used as a sole basis for treatment. Nasal washings and  aspirates are unacceptable for Xpert Xpress SARS-CoV-2/FLU/RSV  testing. Fact Sheet for Patients: PinkCheek.be Fact Sheet for Healthcare Providers: GravelBags.it This test is not yet approved or cleared by the [place] FDA and  has been authorized for detection and/or diagnosis of SARS-CoV-2 by  FDA under an Emergency Use Authorization (EUA).  This EUA will remain  in effect (meaning this test can be used) for the duration of the  Covid-19 declaration under Section 564(b)(1) of the Act, 21  U.S.C. section 360bbb-3(b)(1), unless the authorization is  terminated or revoked. Performed at [facility], [place] [address], [place], [place] [address]   Aerobic/Anaerobic Culture (surgical/deep wound)     Status: None (Preliminary result)   Collection Time: 10/08/19  1:20 PM   Specimen: Wound  Result Value Ref Range Status   Specimen Description WOUND  Final   Special Requests LEFT HIP  Final   Gram Stain   Final    ABUNDANT WBC PRESENT,BOTH PMN AND MONONUCLEAR NO ORGANISMS SEEN    Culture   Final    MODERATE GRAM NEGATIVE RODS IDENTIFICATION AND SUSCEPTIBILITIES TO FOLLOW Performed at [facility], [place] [address], [place], [place] [address]    Report Status PENDING  Incomplete    Michel Bickers, MD [facility] Infect[facility]  [place] [phone] pager   [phone] cell 10/10/2019, 9:35 AM

## 2019-10-10 NOTE — Evaluation (Signed)
Occupational Therapy Evaluation Patient Details Name: Robert Sullivan MRN: [id number] DOB: [birth year] Today's Date: 10/10/2019    History of Present Illness 54 year old gentleman presents 10/08/19 with medical history of diabetes mellitus, arthritis bil knees, DVT, HTN, and left gluteal abscess (with numerous recent hospital admissions for irrigation and debridement) and currently with wound dehiscence for repeat I&D.   Clinical Impression   PTA, pt was living with his wife and was independent with ADLs. Pt currently requiring Min Guard-Min A for UB ADLs, Mod-Max A for LB ADLs, and Supervision for sit<>stand transfer. Pt's functional performance limited by blood leaking at wound vac at L hip. Pt highly motivated to participate in therapy despite pain. Pt would benefit from further acute OT to facilitate safe dc. Recommend dc to home with HHOT for further OT to optimize safety, independence with ADLs, and return to PLOF. However, pt may progress to dc without HH needs.    Follow Up Recommendations  Home health OT;Supervision - Intermittent(May progress to no HH needs)    Equipment Recommendations  None recommended by OT    Recommendations for Other Services PT consult     Precautions / Restrictions Precautions Precautions: Fall(bleeding) Precaution Comments: monitor BP and HR Restrictions Weight Bearing Restrictions: Yes LLE Weight Bearing: Weight bearing as tolerated      Mobility Bed Mobility Overal bed mobility: Modified Independent Bed Mobility: Supine to Sit;Sit to Supine     Supine to sit: Modified independent (Device/Increase time);HOB elevated Sit to supine: Modified independent (Device/Increase time);HOB elevated   General bed mobility comments: use of bed rail  Transfers Overall transfer level: Needs assistance Equipment used: None Transfers: Sit to/from Stand Sit to Stand: Supervision         General transfer comment: Supervision for safety    Balance Overall  balance assessment: Needs assistance Sitting-balance support: No upper extremity supported;Feet supported Sitting balance-Leahy Scale: Normal     Standing balance support: Single extremity supported Standing balance-Leahy Scale: Good Standing balance comment: supervision with unilateral UE support of bed rail                           ADL either performed or assessed with clinical judgement   ADL Overall ADL's : Needs assistance/impaired Eating/Feeding: Set up;Bed level   Grooming: Sitting;Supervision/safety;Set up   Upper Body Bathing: Minimal assistance;Sitting   Lower Body Bathing: Moderate assistance;Sitting/lateral leans;Sit to/from stand   Upper Body Dressing : Set up;Sitting   Lower Body Dressing: Maximal assistance;Sit to/from stand Lower Body Dressing Details (indicate cue type and reason): Max A to don socks               General ADL Comments: Session limited by bleeding at wound vac. Pt agreeable to sit<>stand at EOB. Pt motivated to progress fucntional performance so he can walk into bathroom and use toilet.     Vision         Perception     Praxis      Pertinent Vitals/Pain Pain Assessment: Faces Faces Pain Scale: Hurts little more Pain Location: LLE Pain Descriptors / Indicators: Grimacing Pain Intervention(s): Monitored during session;Limited activity within patient's tolerance;Repositioned     Hand Dominance Right   Extremity/Trunk Assessment Upper Extremity Assessment Upper Extremity Assessment: Overall WFL for tasks assessed   Lower Extremity Assessment Lower Extremity Assessment: Defer to PT evaluation   Cervical / Trunk Assessment Cervical / Trunk Assessment: Normal   Communication Communication Communication: No difficulties   Cognition Arousal/Alertness:  Awake/alert Behavior During Therapy: WFL for tasks assessed/performed Overall Cognitive Status: Within Functional Limits for tasks assessed                                      General Comments  pt tachy up to mid 130s with standing, cBP of 108/47 supine, 122/64 sitting, 110/64 standing and 124/61 upon return to semi-fowlers    Exercises Exercises: (PT provides level 4 theraband for UE exercise)   Shoulder Instructions      Home Living Family/patient expects to be discharged to:: Private residence Living Arrangements: Children;Spouse/significant other Available Help at Discharge: Family;Available 24 hours/day Type of Home: House Home Access: Stairs to enter [employer] of Steps: 4 Entrance Stairs-Rails: None Home Layout: Two level;Bed/bath upstairs Alternate Level Stairs-Number of Steps: flight of stairs Alternate Level Stairs-Rails: Right Bathroom Shower/Tub: Occupational psychologist: Standard Bathroom Accessibility: Yes   Home Equipment: Environmental consultant - 2 wheels;Crutches;Shower seat;Cane - single point          Prior Functioning/Environment Level of Independence: Independent with assistive device(s)        Comments: independent with ambulation, utilizing cane for stair negotiation, reports progressing to ambulating 3000 steps per day        OT Problem List: Decreased activity tolerance;Impaired balance (sitting and/or standing);Decreased safety awareness;Decreased strength      OT Treatment/Interventions: Self-care/ADL training;Therapeutic exercise;Neuromuscular education;Energy conservation;DME and/or AE instruction;Therapeutic activities;Patient/family education;Balance training    OT Goals(Current goals can be found in the care plan section) Acute Rehab OT Goals Patient Stated Goal: To be able to walk to bathroom short term, return to independence long term OT Goal Formulation: With patient/family Time For Goal Achievement: 10/19/19 Potential to Achieve Goals: Good  OT Frequency: Min 2X/week   Barriers to D/C:            Co-evaluation PT/OT/SLP Co-Evaluation/Treatment: Yes Reason for  Co-Treatment: For patient/therapist safety;To address functional/ADL transfers PT goals addressed during session: Mobility/safety with mobility;Balance;Strengthening/ROM OT goals addressed during session: ADL's and self-care      AM-PAC OT "6 Clicks" Daily Activity     Outcome Measure Help from another person eating meals?: None Help from another person taking care of personal grooming?: None Help from another person toileting, which includes using toliet, bedpan, or urinal?: A Little Help from another person bathing (including washing, rinsing, drying)?: A Little Help from another person to put on and taking off regular upper body clothing?: None Help from another person to put on and taking off regular lower body clothing?: A Little 6 Click Score: 21   End of Session Nurse Communication: Mobility status  Activity Tolerance: Patient tolerated treatment well Patient left: in bed;with call bell/phone within reach;with family/visitor present;with nursing/sitter in room  OT Visit Diagnosis: Unsteadiness on feet (R26.81);Repeated falls (R29.6)                Time: 4327-6147 OT Time Calculation (min): 24 min Charges:  OT General Charges $OT Visit: 1 Visit OT Evaluation $OT Eval Moderate Complexity: [place], OTR/L Acute Rehab Pager: [phone] Office: [place] 10/10/2019, 5:22 PM

## 2019-10-10 NOTE — Consult Note (Signed)
Referring Provider:  [facility]         Primary Care Physician:  Cathleen Corti, PA-C Primary Gastroenterologist:  Dr. Rush Landmark        We were asked to see this patient for: Decompensated cirrhosis                 ASSESSMENT /  PLAN    54 year old male with history of hypertension, hyperlipidemia, sleep apnea, DM, DVT, arthritis, morbid obesity and cirrhosis.  # Cirrhosis with superimposed acute liver failure complicated by AKI.  --Suspect multifactorial ( surgeries, antibiotics and shock liver from hypotension).  --Long discussion with patient and his wife about probable causes for progressive liver problems. Also discussed ways we manage ascites / volume overload in cirrhosis. He has AKI, I explained how this complicated diuresis.  --Will follow. Not anything to do acutely from GI standpoint.  --May benefit from Vitamin K in setting of ongoing bleeding from wound.   #AKI --in setting of hypotension / blood loss --Cr improved overnight.    # Acute on chronic anemia --Multifactorial but suspect mainly related to multiple hip I&D and ongoing blood loss from wound. No overt GI bleeding --Has received several units of blood --Getting cryo to help with the bleeding.    HPI:    Chief Complaint:  cirrhosis  Robert Sullivan is a 54 y.o. male seen by [place] as a new patient in the office 09/11/2019 for evaluation of swelling and possible cirrhosis.  CT scan in January revealing of mild splenomegaly and possible nodularity of the liver.  Patient had been hospitalized a few months prior for a hip abscess.  It was during this time that he noticed increased generalized swelling.  At the time of office visit patient was short of breath, he was directed to the ED and subsequently admitted. See Below  09/11/19 -09/25/19 Admission.  Admitted to left gluteal abscess, bilateral pleural effusions, decompensated cirrhosis.  Seen by infectious disease who recommended IR drainage.  Preliminary fluid  studies positive for gram positive cocci.  Orthopedics saw patient and took him for debridement of left hip/wound VAC placement on 09/19/2019. We saw him in consultation, started diuretics and ordered LVP,  9 L removed on 09/13/2019.  No evidence for SBP.  Volume overload improved with diuretics.  Patient was discharged home 09/25/2019 with plans to continue IV antibiotics.    Patient had follow-up with orthopedics on 10/08/2019, noted to have wound dehiscence and subsequently admitted for repeat I&D and closure of wound.  Gram stain negative . He received 2 units of PRBCs postoperatively.    Patient has undergone 9 I& Ds of gluteal abscess. Wife at bedside. Patient and wife have been displeased with their care. They feel like no one has been addressing their his issues. He wasn't able to come for office follow up ( ended up in ED both times).   In mid March patient's transaminases were AST 44 / ALT 15. Yesterday they rose to 449 and 178. Bilirubin up from normal to 2.8.  TAround 2 am today AST 1944 / ALT 684, total bilirubin  2.9. Repeat liver tests at 830am were slightly worse. His INR is rising as well though stabilized at 2.3.    Patient's hgb has been fluctuating from 7-8 since problems with hip / surgeries started ( in December). He has received 5 uPRBC since 3/31 and today getting cryo. Today hgb down to 6.1 from 7.3 yesterday. More blood has been ordered. No overt GI bleeding.  Past Medical History:  Diagnosis Date  . Anemia   . Anxiety   . Arthritis 05/13/2019   knees  . Cirrhosis ([place])   . Depression   . Diabetes mellitus without complication ([place])   . DVT (deep venous thrombosis) ([place])   . Gallstones   . Gluteal abscess   . Headache   . HTN (hypertension)   . Hyperlipidemia   . Sleep apnea    doesn't use cpap  . Splenomegaly     Past Surgical History:  Procedure Laterality Date  . I & D EXTREMITY Left 07/16/2019   Procedure: LEFT HIP DEBRIDEMENT;  Surgeon: Newt Minion,  MD;  Location: [place];  Service: Orthopedics;  Laterality: Left;  . I & D EXTREMITY Left 07/18/2019   Procedure: REPEAT IRRIGATION AND DEBRIDEMENT LEFT HIP;  Surgeon: Newt Minion, MD;  Location: [place];  Service: Orthopedics;  Laterality: Left;  . I & D EXTREMITY Left 09/19/2019   Procedure: DEBRIDEMENT LEFT HIP Appilcattion OF WOUND VAC;  Surgeon: Newt Minion, MD;  Location: [place];  Service: Orthopedics;  Laterality: Left;  . I & D EXTREMITY Left 10/08/2019   Procedure: IRRIGATION AND DEBRIDEMENT, REVISION LEFT HIP WOUND;  Surgeon: Newt Minion, MD;  Location: [place];  Service: Orthopedics;  Laterality: Left;  . INCISION AND DRAINAGE ABSCESS Left 06/23/2019   Procedure: INCISION AND DRAINAGE GLUTEAL ABSCESS;  Surgeon: Renette Butters, MD;  Location: [place];  Service: Orthopedics;  Laterality: Left;  . INCISION AND DRAINAGE ABSCESS Left 07/09/2019   Procedure: INCISION AND DRAINAGE LEFT HIP/PELVIC ABSCESS, APPLICATION OF NEGATIVE PRESSURE WOUND VAC;  Surgeon: Newt Minion, MD;  Location: [place];  Service: Orthopedics;  Laterality: Left;  . INCISION AND DRAINAGE HIP Left 06/25/2019   Procedure: IRRIGATION AND DEBRIDEMENT HIP;  Surgeon: Shona Needles, MD;  Location: [place];  Service: Orthopedics;  Laterality: Left;  . INCISION AND DRAINAGE OF WOUND Left 07/12/2019   Procedure: LEFT HIP DEBRIDEMENT;  Surgeon: Newt Minion, MD;  Location: [place];  Service: Orthopedics;  Laterality: Left;  . IR PARACENTESIS  09/13/2019  . IR US GUIDE BX ASP/DRAIN  06/17/2019  . IR US GUIDE BX ASP/DRAIN  09/13/2019  . KNEE ARTHROSCOPY Bilateral    left x 2, 1 right  . LAPAROSCOPIC CHOLECYSTECTOMY  ~ 2010  . LUMBAR DISC SURGERY    . NASAL SINUS SURGERY    . TEE WITHOUT CARDIOVERSION N/A 06/16/2019   Procedure: TRANSESOPHAGEAL ECHOCARDIOGRAM (TEE);  Surgeon: Dorothy Spark, MD;  Location: [facility] ENDOSCOPY;  Service: Cardiovascular;  Laterality: N/A;    Prior to Admission medications   Medication Sig Start Date End  Date Taking? Authorizing Provider  aspirin 325 MG tablet Take 1 tablet (325 mg total) by mouth daily. Patient taking differently: Take 325 mg by mouth as needed for mild pain or headache.  07/25/19  Yes Persons, Bevely Palmer, PA  Ensure Max Protein (ENSURE MAX PROTEIN) LIQD Take 330 mLs (11 oz total) by mouth daily. 07/01/19  Yes Hongalgi, Lenis Dickinson, MD  ferrous sulfate 325 (65 FE) MG tablet Take 1 tablet (325 mg total) by mouth daily with breakfast. 09/24/19 10/24/19 Yes Kyle, Tyrone A, DO  furosemide (LASIX) 40 MG tablet Take 1 tablet (40 mg total) by mouth daily. 09/24/19 10/24/19 Yes Kyle, Tyrone A, DO  insulin aspart (NOVOLOG) 100 UNIT/ML injection Inject 1 Units into the skin 2 (two) times daily as needed (only if BGL is 120 or greater).  Yes [provider]  insulin glargine (LANTUS) 100 UNIT/ML injection Inject 0.24 mLs (24 Units total) into the skin daily. Patient taking differently: Inject 15 Units into the skin daily as needed (only if BGL is 120 or greater).  07/01/19  Yes Hongalgi, Lenis Dickinson, MD  Multiple Vitamin (MULTIVITAMIN WITH MINERALS) TABS tablet Take 1 tablet by mouth daily. 07/01/19  Yes Hongalgi, Lenis Dickinson, MD  naproxen sodium (ALEVE) 220 MG tablet Take 220-440 mg by mouth 2 (two) times daily as needed (for pain).   Yes [provider]  potassium chloride SA (KLOR-CON) 20 MEQ tablet Take 2 tablets (40 mEq total) by mouth daily. 09/25/19 10/25/19 Yes Kyle, Tyrone A, DO  spironolactone (ALDACTONE) 100 MG tablet Take 1 tablet (100 mg total) by mouth daily. 09/25/19 10/25/19 Yes Kyle, Tyrone A, DO  blood glucose meter kit and supplies KIT Dispense based on patient and insurance preference. Use up to four times daily as directed. (FOR ICD-9 250.00, 250.01). 06/30/19   Modena Jansky, MD  Insulin Syringes, Disposable, U-100 0.5 ML MISC Use as directed 3 times daily AC. 06/30/19   Hongalgi, Lenis Dickinson, MD    Current Facility-Administered Medications  Medication Dose Route  Frequency Provider Last Rate Last Admin  . 0.9 %  sodium chloride infusion (Manually program via Guardrails IV Fluids)   Intravenous Once Karmen Bongo, MD      . 0.9 %  sodium chloride infusion (Manually program via Guardrails IV Fluids)   Intravenous Once Danford, Suann Larry, MD      . 0.9 %  sodium chloride infusion  250 mL Intravenous Continuous Kyere, Belinda K, NP      . albumin human 25 % solution 12.5 g  12.5 g Intravenous Q6H Danford, Christopher P, MD      . ceFAZolin (ANCEF) 3 g in dextrose 5 % 50 mL IVPB  3 g Intravenous On Call to OR Newt Minion, MD      . ceFEPIme (MAXIPIME) 2 g in sodium chloride 0.9 % 100 mL IVPB  2 g Intravenous Q12H Lavenia Atlas, RPH 200 mL/hr at 10/10/19 0958 2 g at 10/10/19 0958  . Chlorhexidine Gluconate Cloth 2 % PADS 6 each  6 each Topical Daily Newt Minion, MD   6 each at 10/10/19 [phone]  . ferrous sulfate tablet 325 mg  325 mg Oral Q breakfast Persons, Bevely Palmer, [place]      . HYDROmorphone (DILAUDID) injection 0.5 mg  0.5 mg Intravenous Q4H PRN Persons, Bevely Palmer, PA   0.5 mg at 10/09/19 0939  . insulin aspart (novoLOG) injection 0-15 Units  0-15 Units Subcutaneous TID WC Persons, Bevely Palmer, PA      . metoCLOPramide (REGLAN) tablet 5-10 mg  5-10 mg Oral Q8H PRN Persons, Bevely Palmer, PA       Or  . metoCLOPramide (REGLAN) injection 5-10 mg  5-10 mg Intravenous Q8H PRN Persons, Bevely Palmer, PA   10 mg at 10/09/19 1430  . ondansetron (ZOFRAN) tablet 4 mg  4 mg Oral Q6H PRN Persons, Bevely Palmer, PA       Or  . ondansetron [facility]) injection 4 mg  4 mg Intravenous Q6H PRN Persons, Bevely Palmer, PA   4 mg at 10/09/19 1430  . oxyCODONE (Oxy IR/ROXICODONE) immediate release tablet 5-10 mg  5-10 mg Oral Q4H PRN Persons, Bevely Palmer, PA   10 mg at 10/08/19 1505  . protein supplement (ENSURE MAX) liquid  11 oz Oral Daily Persons, Stanton Kidney  Anne, PA      . sodium bicarbonate 150 mEq in dextrose 5 % 1,000 mL infusion   Intravenous Continuous Jennelle Human B, NP 50  mL/hr at 10/10/19 0637 New Bag at 10/10/19 0539  . sodium chloride flush (NS) 0.9 % injection 10-40 mL  10-40 mL Intracatheter Q12H Newt Minion, MD   10 mL at 10/10/19 7673  . sodium chloride flush (NS) 0.9 % injection 10-40 mL  10-40 mL Intracatheter PRN Newt Minion, MD      . sodium zirconium cyclosilicate (LOKELMA) packet 10 g  10 g Oral Once Jennelle Human B, NP      . zolpidem (AMBIEN) tablet 5 mg  5 mg Oral QHS PRN Karmen Bongo, MD        Allergies as of 10/07/2019 - Review Complete 10/07/2019  Allergen Reaction Noted  . Sulfa antibiotics Rash 05/27/2019    Family History  Problem Relation Age of Onset  . Hypertension Father     Social History   Socioeconomic History  . Marital status: Married    Spouse name: Not on file  . Number of children: 5  . Years of education: Not on file  . Highest education level: Not on file  Occupational History  . Occupation: Logistics  Tobacco Use  . Smoking status: Former Smoker    Types: Cigars    Quit date: 09/10/2017    Years since quitting: 2.0  . Smokeless tobacco: Never Used  Substance and Sexual Activity  . Alcohol use: Not Currently  . Drug use: Not Currently  . Sexual activity: Not on file  Other Topics Concern  . Not on file  Social History Narrative  . Not on file   Social Determinants of Health   Financial Resource Strain:   . Difficulty of Paying Living Expenses:   Food Insecurity:   . Worried About Charity fundraiser in the Last Year:   . Arboriculturist in the Last Year:   Transportation Needs:   . Film/video editor (Medical):   Marland Kitchen Lack of Transportation (Non-Medical):   Physical Activity:   . Days of Exercise per Week:   . Minutes of Exercise per Session:   Stress:   . Feeling of Stress :   Social Connections:   . Frequency of Communication with Friends and Family:   . Frequency of Social Gatherings with Friends and Family:   . Attends Religious Services:   . Active Member of Clubs or  Organizations:   . Attends Archivist Meetings:   Marland Kitchen Marital Status:   Intimate Partner Violence:   . Fear of Current or Ex-Partner:   . Emotionally Abused:   Marland Kitchen Physically Abused:   . Sexually Abused:     Review of Systems: All systems reviewed and negative except where noted in HPI.  Physical Exam: Vital signs in last 24 hours: Temp:  [97.3 F (36.3 C)-99 F (37.2 C)] 98.8 F (37.1 C) (04/02 1000) Pulse Rate:  [98-118] 107 (04/02 1000) Resp:  [11-19] 19 (04/02 1000) BP: (56-120)/(38-98) 111/53 (04/02 1000) SpO2:  [89 %-100 %] 98 % (04/02 1000)   General:   Alert, obese male in NAD Psych:  Pleasant, cooperative. Normal mood and affect. Eyes:  Pupils equal, sclera clear, no icterus.   . Ears:  Normal auditory acuity. Nose:  No deformity, discharge,  or lesions. Neck:  Supple; no masses Lungs:  Clear throughout to auscultation.   No wheezes, crackles, or rhonchi.  Heart:  Regular rate and rhythm;  2+ BLE edema Abdomen:  Soft, non-distended, nontender, BS active,  Rectal:  Deferred  Neurologic:  Alert and  oriented x4;  grossly normal neurologically. Skin:  Intact without significant lesions or rashes.   Intake/Output from previous day: 04/01 0701 - 04/02 0700 In: 1909 [Blood:1909] Out: 775 [Urine:155; Drains:620] Intake/Output this shift: Total I/O In: 272 [Blood:272] Out: -   Lab Results: Recent Labs    10/09/19 1643 10/10/19 0208 10/10/19 0831  WBC 27.3* 19.0* 10.6*  HGB 7.3* 7.8* 6.1*  HCT 23.0* 23.6* 18.5*  PLT 323 167 PLATELET CLUMPS NOTED ON SMEAR, UNABLE TO ESTIMATE   BMET Recent Labs    10/09/19 1643 10/10/19 0208 10/10/19 0831  NA 134* 134* 135  K 6.0* 5.9* 5.0  CL 102 106 106  CO2 14* 17* 21*  GLUCOSE 149* 195* 172*  BUN 26* 29* 31*  CREATININE 2.38* 2.73* 2.56*  CALCIUM 8.3* 8.1* 7.9*   LFT Recent Labs    10/10/19 0831  PROT 4.3*  ALBUMIN 1.9*  AST 1,944*  ALT 684*  ALKPHOS 105  BILITOT 2.5*   PT/INR Recent Labs     10/10/19 0208 10/10/19 0831  LABPROT 27.2* 25.3*  INR 2.5* 2.3*   Hepatitis Panel No results for input(s): HEPBSAG, HCVAB, HEPAIGM, HEPBIGM in the last 72 hours.   . CBC Latest Ref Rng & Units 10/10/2019 10/10/2019 10/09/2019  WBC 4.0 - 10.5 K/uL 10.6(H) 19.0(H) 27.3(H)  Hemoglobin 13.0 - 17.0 g/dL 6.1(LL) 7.8(L) 7.3(L)  Hematocrit 39.0 - 52.0 % 18.5(L) 23.6(L) 23.0(L)  Platelets 150 - 400 K/uL PLATELET CLUMPS NOTED ON SMEAR, UNABLE TO ESTIMATE 167 323    . CMP Latest Ref Rng & Units 10/10/2019 10/10/2019 10/09/2019  Glucose 70 - 99 mg/dL 172(H) 195(H) 149(H)  BUN 6 - 20 mg/dL 31(H) 29(H) 26(H)  Creatinine 0.61 - 1.24 mg/dL 2.56(H) 2.73(H) 2.38(H)  Sodium 135 - 145 mmol/L 135 134(L) 134(L)  Potassium 3.5 - 5.1 mmol/L 5.0 5.9(H) 6.0(H)  Chloride 98 - 111 mmol/L 106 106 102  CO2 22 - 32 mmol/L 21(L) 17(L) 14(L)  Calcium 8.9 - 10.3 mg/dL 7.9(L) 8.1(L) 8.3(L)  Total Protein 6.5 - 8.1 g/dL 4.3(L) 4.4(L) 4.6(L)  Total Bilirubin 0.3 - 1.2 mg/dL 2.5(H) 2.9(H) 2.8(H)  Alkaline Phos 38 - 126 U/L 105 109 91  AST 15 - 41 U/L 1,944(H) 1,855(H) 449(H)  ALT 0 - 44 U/L 684(H) 668(H) 178(H)   Studies/Results: No results found.  Principal Problem:   Wound dehiscence Active Problems:   Type 2 diabetes mellitus without complication (H[facility])   Obesity, Class III, BMI 40-49.9 (morbid obesity) ([place])   Cirrhosis of liver with ascites (H[facility])   Abscess, gluteal, left   Anemia   AKI (acute kidney injury) ([place])    Tye Savoy, NP-C @  10/10/2019, 10:51 AM

## 2019-10-10 NOTE — Progress Notes (Signed)
Beginning at change of shift when RNs entered room together to assess patient, pt appeared pale with low pressures and actively leaking a significant amount of sanguinous fluid onto the sheets from his wound vac. Night RN completed administration of pRBC and pressures remained soft. [facility] provider notified along with RRN. TRH provider ordered 500 NS bolus, which was promptly delivered along with second unit of pRBCs; BPs remained soft despite these interventions. No urine output was noted, which was communicated to [facility] provider. Provider ordered I&O cath to obtain STAT urine tests. I&O yielded 34ms of concentrated yellow urine. Provider updated with new vitals and urine output. RN continued close communication with RRN and TBuncombeprovider while advocating for a higher level of care given unstable BPs and q1 interventions. Furthermore, TJeffersonprovider had ordered q1 VS, which 4NP cannot perform given staffing/patient ratios and acuities; provider then agreed to unit procedure VS q4. TLakes of the [place] Se[place] then ordered transfer to a higher level of care due to low pressures (several MAPs <65) despite frequent intervention including the administration of pRBCs and bolus. CCM then came to assess the patient and stated the patient would stay on 4NP and be given several blood products, including FFP, platelets, albumin, 1L NS bolus, and two units pRBCs. Pressures still remained soft after administration of FFP, Plts, albumin, and 1L NS bolus. Day shift RN received handoff and explanation of these events during report.   10/10/19 0710 VS: 94/42 (57), HR 103, RR 15, O2 97%

## 2019-10-10 NOTE — Evaluation (Signed)
Physical Therapy Evaluation Patient Details Name: Robert Sullivan MRN: [id number] DOB: 02/16/1966 Today's Date: 10/10/2019   History of Present Illness  54 year old gentleman presents 10/08/19 with medical history of diabetes mellitus, arthritis bil knees, DVT, HTN, and left gluteal abscess (with numerous recent hospital admissions for irrigation and debridement) and currently with wound dehiscence for repeat I&D.  Clinical Impression  Pt presents to PT with deficits in activity tolerance, cardiopulmonary function, balance, and gait. Pt limited to bed mobility and transfers at this time due to oozing blood from wound vac site and due to fatigue. Pt mobilizes well in bed and transfers without physical assistance. Pt will continue to benefit from PT POC to improve activity tolerance and progress to gait training when medically appropriate.    Follow Up Recommendations Home health PT;Supervision - Intermittent    Equipment Recommendations  None recommended by PT    Recommendations for Other Services       Precautions / Restrictions Precautions Precautions: Fall(bleeding) Precaution Comments: monitor BP and HR Restrictions Weight Bearing Restrictions: Yes LLE Weight Bearing: Weight bearing as tolerated      Mobility  Bed Mobility Overal bed mobility: Modified Independent Bed Mobility: Supine to Sit;Sit to Supine     Supine to sit: Modified independent (Device/Increase time);HOB elevated Sit to supine: Modified independent (Device/Increase time);HOB elevated   General bed mobility comments: use of bed rail  Transfers Overall transfer level: Needs assistance Equipment used: None Transfers: Sit to/from Stand Sit to Stand: Supervision            Ambulation/Gait                Stairs            Wheelchair Mobility    Modified Rankin (Stroke Patients Only)       Balance Overall balance assessment: Needs assistance Sitting-balance support: No upper extremity  supported;Feet supported Sitting balance-Leahy Scale: Normal     Standing balance support: Single extremity supported Standing balance-Leahy Scale: Good Standing balance comment: supervision with unilateral UE support of bed rail                             Pertinent Vitals/Pain Pain Assessment: Faces Faces Pain Scale: Hurts little more Pain Location: LLE Pain Descriptors / Indicators: Grimacing Pain Intervention(s): Limited activity within patient's tolerance    Home Living Family/patient expects to be discharged to:: Private residence Living Arrangements: Children;Spouse/significant other Available Help at Discharge: Family;Available 24 hours/day Type of Home: House Home Access: Stairs to enter Entrance Stairs-Rails: None Entrance Stairs-Number of Steps: 4 Home Layout: Two level;Bed/bath upstairs Home Equipment: Walker - 2 wheels;Crutches;Shower seat;Cane - single point      Prior Function Level of Independence: Independent with assistive device(s)         Comments: independent with ambulation, utilizing cane for stair negotiation, reports progressing to ambulating 3000 steps per day     Hand Dominance   Dominant Hand: Right    Extremity/Trunk Assessment   Upper Extremity Assessment Upper Extremity Assessment: Overall WFL for tasks assessed    Lower Extremity Assessment Lower Extremity Assessment: Generalized weakness(bleeding from wound vac on LLE)    Cervical / Trunk Assessment Cervical / Trunk Assessment: Normal  Communication   Communication: No difficulties  Cognition Arousal/Alertness: Awake/alert Behavior During Therapy: WFL for tasks assessed/performed Overall Cognitive Status: Within Functional Limits for tasks assessed  General Comments General comments (skin integrity, edema, etc.): pt tachy up to mid 130s with standing, cBP of 108/47 supine, 122/64 sitting, 110/64 standing and  124/61 upon return to semi-fowlers    Exercises     Assessment/Plan    PT Assessment Patient needs continued PT services  PT Problem List Decreased strength;Decreased activity tolerance;Decreased balance;Decreased mobility;Cardiopulmonary status limiting activity;Pain       PT Treatment Interventions DME instruction;Gait training;Stair training;Functional mobility training;Therapeutic activities;Therapeutic exercise;Balance training;Patient/family education;Neuromuscular re-education    PT Goals (Current goals can be found in the Care Plan section)  Acute Rehab PT Goals Patient Stated Goal: To be able to walk to bathroom short term, return to independence long term PT Goal Formulation: With patient Time For Goal Achievement: 10/24/19 Potential to Achieve Goals: Good Additional Goals Additional Goal #1: Pt will maintain dynamic standing balance within 10 inches of her base of support with supervision, without UE support.    Frequency Min 3X/week   Barriers to discharge        Co-evaluation PT/OT/SLP Co-Evaluation/Treatment: Yes Reason for Co-Treatment: Complexity of the patient's impairments (multi-system involvement);To address functional/ADL transfers PT goals addressed during session: Mobility/safety with mobility;Balance;Strengthening/ROM         AM-PAC PT "6 Clicks" Mobility  Outcome Measure Help needed turning from your back to your side while in a flat bed without using bedrails?: None Help needed moving from lying on your back to sitting on the side of a flat bed without using bedrails?: None Help needed moving to and from a bed to a chair (including a wheelchair)?: None Help needed standing up from a chair using your arms (e.g., wheelchair or bedside chair)?: None Help needed to walk in hospital room?: A Little Help needed climbing 3-5 steps with a railing? : A Lot 6 Click Score: 21    End of Session   Activity Tolerance: Patient tolerated treatment  well Patient left: in bed;with call bell/phone within reach;with family/visitor present;with nursing/sitter in room Nurse Communication: Mobility status PT Visit Diagnosis: Other abnormalities of gait and mobility (R26.89);Pain Pain - Right/Left: Left Pain - part of body: Hip    Time: 7014-1030 PT Time Calculation (min) (ACUTE ONLY): 24 min   Charges:   PT Evaluation $PT Eval High Complexity: 1 High          Zenaida Niece, PT, DPT Acute Rehabilitation Pager: [phone]   Zenaida Niece 10/10/2019, 4:33 PM

## 2019-10-10 NOTE — Consult Note (Addendum)
Reason for Consult: Liver failure Referring Physician: Kazi Reppond is an 54 y.o. male.  HPI: Patient's case discussed with Dr. Megan Salon as well as the patient and his wife the nursing staff and I discussed with the other GI team and his hospital computer chart was reviewed and he was diagnosed with Karlene Lineman and cirrhosis a month ago and he did try to change his diet lose weight and exercise but unfortunately his recurrent infection has caused an obvious setback and we had a long talk about the presumed diagnosis of Karlene Lineman and cirrhosis including possible experimental medicines [employer] consults and possibly even transplantation but certainly he has to get over this acute problem and has to be infection free before we can even talk about a transplant and if his current kidney issue and elevated liver tests is a reaction to his infection and hypotension and not hepatorenal hopefully it will resolve and I answered all of their questions  Past Medical History:  Diagnosis Date  . Anemia   . Anxiety   . Arthritis 05/13/2019   knees  . Cirrhosis ([place])   . Depression   . Diabetes mellitus without complication ([place])   . DVT (deep venous thrombosis) ([place])   . Gallstones   . Gluteal abscess   . Headache   . HTN (hypertension)   . Hyperlipidemia   . Sleep apnea    doesn't use cpap  . Splenomegaly     Past Surgical History:  Procedure Laterality Date  . I & D EXTREMITY Left 07/16/2019   Procedure: LEFT HIP DEBRIDEMENT;  Surgeon: Newt Minion, MD;  Location: [facility];  Service: Orthopedics;  Laterality: Left;  . I & D EXTREMITY Left 07/18/2019   Procedure: REPEAT IRRIGATION AND DEBRIDEMENT LEFT HIP;  Surgeon: Newt Minion, MD;  Location: [facility];  Service: Orthopedics;  Laterality: Left;  . I & D EXTREMITY Left 09/19/2019   Procedure: DEBRIDEMENT LEFT HIP Appilcattion OF WOUND VAC;  Surgeon: Newt Minion, MD;  Location: [facility];  Service: Orthopedics;  Laterality: Left;  . I & D EXTREMITY  Left 10/08/2019   Procedure: IRRIGATION AND DEBRIDEMENT, REVISION LEFT HIP WOUND;  Surgeon: Newt Minion, MD;  Location: [facility];  Service: Orthopedics;  Laterality: Left;  . INCISION AND DRAINAGE ABSCESS Left 06/23/2019   Procedure: INCISION AND DRAINAGE GLUTEAL ABSCESS;  Surgeon: Renette Butters, MD;  Location: [facility];  Service: Orthopedics;  Laterality: Left;  . INCISION AND DRAINAGE ABSCESS Left 07/09/2019   Procedure: INCISION AND DRAINAGE LEFT HIP/PELVIC ABSCESS, APPLICATION OF NEGATIVE PRESSURE WOUND VAC;  Surgeon: Newt Minion, MD;  Location: [facility];  Service: Orthopedics;  Laterality: Left;  . INCISION AND DRAINAGE HIP Left 06/25/2019   Procedure: IRRIGATION AND DEBRIDEMENT HIP;  Surgeon: Shona Needles, MD;  Location: [facility];  Service: Orthopedics;  Laterality: Left;  . INCISION AND DRAINAGE OF WOUND Left 07/12/2019   Procedure: LEFT HIP DEBRIDEMENT;  Surgeon: Newt Minion, MD;  Location: [facility];  Service: Orthopedics;  Laterality: Left;  . IR PARACENTESIS  09/13/2019  . IR US GUIDE BX ASP/DRAIN  06/17/2019  . IR US GUIDE BX ASP/DRAIN  09/13/2019  . KNEE ARTHROSCOPY Bilateral    left x 2, 1 right  . LAPAROSCOPIC CHOLECYSTECTOMY  ~ 2010  . LUMBAR DISC SURGERY    . NASAL SINUS SURGERY    . TEE WITHOUT CARDIOVERSION N/A 06/16/2019   Procedure: TRANSESOPHAGEAL ECHOCARDIOGRAM (TEE);  Surgeon: Dorothy Spark, MD;  [facility];  Service: Cardiovascular;  Laterality: N/A;    Family History  Problem Relation Age of Onset  . Hypertension Father     Social History: my initial consult note was Smart phrase populated with some previous drug use and alcohol which the patient now denies and I was not the one he started to Smart phrase but I have no reason to doubt what he says  Allergies:  Allergies  Allergen Reactions  . Sulfa Antibiotics Rash and Other (See Comments)    Total body rash    Medications: I have reviewed the patient's current medications.  Results for orders  placed or performed during the hospital encounter of 10/08/19 (from the past 48 hour(s))  Type and screen [place]     Status: None (Preliminary result)   Collection Time: 10/08/19  5:55 PM  Result Value Ref Range   ABO/RH(D) A POS    Antibody Screen NEG    Sample Expiration 10/11/2019,2359    Unit Number [id number]    Blood Component Type RED CELLS,LR    Unit division 00    Status of Unit ISSUED,FINAL    Transfusion Status OK TO TRANSFUSE    Crossmatch Result Compatible    Unit Number [id number]    Blood Component Type RED CELLS,LR    Unit division 00    Status of Unit ISSUED,FINAL    Transfusion Status OK TO TRANSFUSE    Crossmatch Result Compatible    Unit Number [id number]    Blood Component Type RED CELLS,LR    Unit division 00    Status of Unit ISSUED,FINAL    Transfusion Status OK TO TRANSFUSE    Crossmatch Result Compatible    Unit Number [id number]    Blood Component Type RED CELLS,LR    Unit division 00    Status of Unit ISSUED,FINAL    Transfusion Status OK TO TRANSFUSE    Crossmatch Result Compatible    Unit Number [id number]    Blood Component Type RED CELLS,LR    Unit division 00    Status of Unit ISSUED,FINAL    Transfusion Status OK TO TRANSFUSE    Crossmatch Result Compatible    Unit Number [id number]    Blood Component Type RED CELLS,LR    Unit division 00    Status of Unit ISSUED    Transfusion Status OK TO TRANSFUSE    Crossmatch Result      Compatible Performed at [facility], [address], [place], [place] [address]    Unit Number [id number]    Blood Component Type RED CELLS,LR    Unit division 00    Status of Unit ALLOCATED    Transfusion Status OK TO TRANSFUSE    Crossmatch Result Compatible    Unit Number [id number]    Blood Component Type RED CELLS,LR    Unit division 00    Status of Unit ALLOCATED    Transfusion Status OK TO TRANSFUSE    Crossmatch Result Compatible    Unit Number  [id number]    Blood Component Type RED CELLS,LR    Unit division 00    Status of Unit ALLOCATED    Transfusion Status OK TO TRANSFUSE    Crossmatch Result Compatible   Prepare RBC (crossmatch)     Status: None   Collection Time: 10/08/19  6:00 PM  Result Value Ref Range   Order Confirmation      ORDER PROCESSED BY BLOOD BANK Performed at [facility], [address], [place], [place] [address]   Hemoglobin and Hematocrit     Status: Abnormal   Collection Time: 10/08/19  6:16 PM  Result Value Ref Range   Hemoglobin 6.9 (LL) 13.0 - 17.0 g/dL    Comment: This critical result has verified and been called to C HARRIS,RN by Red Christians on 03 31 2021 at 1902, and has been read back.  REPEATED TO VERIFY CORRECTED ON 03/31 AT 1906: PREVIOUSLY REPORTED AS 6.9 This critical result has verified and been called to C HARRIS,RN by Red Christians on 03 31 2021 at 1902, and has been read back.     HCT 23.0 (L) 39.0 - 52.0 %    Comment: Performed at [facility], [place] [address], [place], [place] [address]  CBC     Status: Abnormal   Collection Time: 10/09/19  1:01 AM  Result Value Ref Range   WBC 17.8 (H) 4.0 - 10.5 K/uL   RBC 2.91 (L) 4.22 - 5.81 MIL/uL   Hemoglobin 7.8 (L) 13.0 - 17.0 g/dL   HCT 24.6 (L) 39.0 - 52.0 %   MCV 84.5 80.0 - 100.0 fL   MCH 26.8 26.0 - 34.0 pg   MCHC 31.7 30.0 - 36.0 g/dL   RDW 18.3 (H) 11.5 - 15.5 %   Platelets 238 150 - 400 K/uL   nRBC 0.0 0.0 - 0.2 %    Comment: Performed at [facility], [place] [address], [place], [place] [address]  Prepare RBC (crossmatch)     Status: None   Collection Time: 10/09/19  1:46 AM  Result Value Ref Range   Order Confirmation      ORDER PROCESSED BY BLOOD BANK Performed at [facility], [place] [address], [place], [place] [address]   Hemoglobin and hematocrit, blood     Status: Abnormal   Collection Time: 10/09/19 11:29 AM  Result Value Ref Range   Hemoglobin 7.3 (L) 13.0 - 17.0 g/dL   HCT 22.7 (L) 39.0 -  52.0 %    Comment: Performed at [facility], [place] [address], [place], [place] [address]  CBC     Status: Abnormal   Collection Time: 10/09/19  4:43 PM  Result Value Ref Range   WBC 27.3 (H) 4.0 - 10.5 K/uL   RBC 2.62 (L) 4.22 - 5.81 MIL/uL   Hemoglobin 7.3 (L) 13.0 - 17.0 g/dL   HCT 23.0 (L) 39.0 - 52.0 %   MCV 87.8 80.0 - 100.0 fL   MCH 27.9 26.0 - 34.0 pg   MCHC 31.7 30.0 - 36.0 g/dL   RDW 18.6 (H) 11.5 - 15.5 %   Platelets 323 150 - 400 K/uL   nRBC 0.0 0.0 - 0.2 %    Comment: Performed at [facility] [address], [place], [place] [address]  Comprehensive metabolic panel     Status: Abnormal   Collection Time: 10/09/19  4:43 PM  Result Value Ref Range   Sodium 134 (L) 135 - 145 mmol/L   Potassium 6.0 (H) 3.5 - 5.1 mmol/L   Chloride 102 98 - 111 mmol/L   CO2 14 (L) 22 - 32 mmol/L   Glucose, Bld 149 (H) 70 - 99 mg/dL    Comment: Glucose reference range applies only to samples taken after fasting for at least 8 hours.   BUN 26 (H) 6 - 20 mg/dL   Creatinine, Ser 2.38 (H) 0.61 - 1.24 mg/dL  Calcium 8.3 (L) 8.9 - 10.3 mg/dL   Total Protein 4.6 (L) 6.5 - 8.1 g/dL   Albumin 1.9 (L) 3.5 - 5.0 g/dL   AST 449 (H) 15 - 41 U/L   ALT 178 (H) 0 - 44 U/L   Alkaline Phosphatase 91 38 - 126 U/L   Total Bilirubin 2.8 (H) 0.3 - 1.2 mg/dL   GFR calc non Af Amer 30 (L) >60 mL/min   GFR calc Af Amer 35 (L) >60 mL/min   Anion gap 18 (H) 5 - 15    Comment: Performed at [facility] [address] [place], [place] [address]  Protime-INR     Status: Abnormal   Collection Time: 10/09/19  4:43 PM  Result Value Ref Range   Prothrombin Time 26.9 (H) 11.4 - 15.2 seconds   INR 2.5 (H) 0.8 - 1.2    Comment: (NOTE) INR goal varies based on device and disease states. Performed at [facility], [place] [address], [place], [place] [address]   Glucose, capillary     Status: Abnormal   Collection Time: 10/09/19  5:27 PM  Result Value Ref Range   Glucose-Capillary 127 (H) 70  - 99 mg/dL    Comment: Glucose reference range applies only to samples taken after fasting for at least 8 hours.  Glucose, capillary     Status: Abnormal   Collection Time: 10/09/19 11:56 PM  Result Value Ref Range   Glucose-Capillary 191 (H) 70 - 99 mg/dL    Comment: Glucose reference range applies only to samples taken after fasting for at least 8 hours.  Na and K (sodium & potassium), rand urine     Status: None   Collection Time: 10/10/19 12:22 AM  Result Value Ref Range   Sodium, Ur <10 mmol/L   Potassium Urine 68 mmol/L    Comment: Performed at [facility] [address] [place], [place] [address]  Creatinine, urine, random     Status: None   Collection Time: 10/10/19 12:22 AM  Result Value Ref Range   Creatinine, Urine 113.06 mg/dL    Comment: Performed at [facility] [address], [place], [place] [address]  Osmolality, urine     Status: None   Collection Time: 10/10/19 12:23 AM  Result Value Ref Range   Osmolality, Ur 320 300 - 900 mOsm/kg    Comment: Performed at [facility] [address], [place], [place] [address]  Urinalysis, Routine w reflex microscopic     Status: Abnormal   Collection Time: 10/10/19  1:00 AM  Result Value Ref Range   Color, Urine AMBER (A) YELLOW    Comment: BIOCHEMICALS MAY BE AFFECTED BY COLOR   APPearance CLOUDY (A) CLEAR   Specific Gravity, Urine 1.030 1.005 - 1.030   pH 5.0 5.0 - 8.0   Glucose, UA 50 (A) NEGATIVE mg/dL   Hgb urine dipstick LARGE (A) NEGATIVE   Bilirubin Urine NEGATIVE NEGATIVE   Ketones, ur NEGATIVE NEGATIVE mg/dL   Protein, ur 100 (A) NEGATIVE mg/dL   Nitrite NEGATIVE NEGATIVE   Leukocytes,Ua TRACE (A) NEGATIVE   RBC / HPF >50 (H) 0 - 5 RBC/hpf   Bacteria, UA NONE SEEN NONE SEEN   Squamous Epithelial / LPF 0-5 0 - 5   Mucus PRESENT     Comment: Performed at [facility], [place] [address] [place], [place] [address]  Prepare RBC (crossmatch)     Status: None  Collection Time: 10/10/19   1:42 AM  Result Value Ref Range   Order Confirmation      ORDER PROCESSED BY BLOOD BANK Performed at [facility], [place] [address] [place], [place] [address]   Prepare Pheresed Platelets     Status: None (Preliminary result)   Collection Time: 10/10/19  1:43 AM  Result Value Ref Range   Unit Number [id number]    Blood Component Type PLTP LR1 PAS    Unit division 00    Status of Unit ISSUED    Transfusion Status      OK TO TRANSFUSE Performed at [facility] [address] [place], [place] [address]   Prepare fresh frozen plasma     Status: None (Preliminary result)   Collection Time: 10/10/19  1:43 AM  Result Value Ref Range   Unit Number [id number]    Blood Component Type THAWED PLASMA    Unit division 00    Status of Unit ISSUED    Transfusion Status      OK TO TRANSFUSE Performed at [facility] [address] [place], [place] [address]   Culture, blood (routine x 2)     Status: None (Preliminary result)   Collection Time: 10/10/19  1:50 AM   Specimen: BLOOD LEFT HAND  Result Value Ref Range   Specimen Description BLOOD LEFT HAND    Special Requests      BOTTLES DRAWN AEROBIC AND ANAEROBIC Blood Culture results may not be optimal due to an excessive volume of blood received in culture bottles   Culture      NO GROWTH < 12 HOURS Performed at [facility] [address] [place], [place] [address]    Report Status PENDING   Lactic acid, plasma     Status: Abnormal   Collection Time: 10/10/19  2:08 AM  Result Value Ref Range   Lactic Acid, Venous 4.9 (HH) 0.5 - 1.9 mmol/L    Comment: CRITICAL VALUE NOTED.  VALUE IS CONSISTENT WITH PREVIOUSLY REPORTED AND CALLED VALUE. Performed at [facility], [place] [address], [place], [place] [address]   CBC     Status: Abnormal   Collection Time: 10/10/19  2:08 AM  Result Value Ref Range   WBC 19.0 (H) 4.0 - 10.5 K/uL   RBC 2.71 (L) 4.22 - 5.81 MIL/uL   Hemoglobin 7.8 (L) 13.0 - 17.0 g/dL    HCT 23.6 (L) 39.0 - 52.0 %   MCV 87.1 80.0 - 100.0 fL   MCH 28.8 26.0 - 34.0 pg   MCHC 33.1 30.0 - 36.0 g/dL   RDW 16.9 (H) 11.5 - 15.5 %   Platelets 167 150 - 400 K/uL   nRBC 0.0 0.0 - 0.2 %    Comment: Performed at [facility], [place] [address] [place], [place] [address]  Comprehensive metabolic panel     Status: Abnormal   Collection Time: 10/10/19  2:08 AM  Result Value Ref Range   Sodium 134 (L) 135 - 145 mmol/L   Potassium 5.9 (H) 3.5 - 5.1 mmol/L   Chloride 106 98 - 111 mmol/L   CO2 17 (L) 22 - 32 mmol/L   Glucose, Bld 195 (H) 70 - 99 mg/dL    Comment: Glucose reference range applies only to samples taken after fasting for at least 8 hours.   BUN 29 (H) 6 - 20 mg/dL   Creatinine, Ser 2.73 (H) 0.61 - 1.24 mg/dL  Calcium 8.1 (L) 8.9 - 10.3 mg/dL   Total Protein 4.4 (L) 6.5 - 8.1 g/dL   Albumin 1.8 (L) 3.5 - 5.0 g/dL   AST 1,855 (H) 15 - 41 U/L   ALT 668 (H) 0 - 44 U/L   Alkaline Phosphatase 109 38 - 126 U/L   Total Bilirubin 2.9 (H) 0.3 - 1.2 mg/dL   GFR calc non Af Amer 26 (L) >60 mL/min   GFR calc Af Amer 30 (L) >60 mL/min   Anion gap 11 5 - 15    Comment: Performed at [facility] [address], [place], [place] [address]  Protime-INR     Status: Abnormal   Collection Time: 10/10/19  2:08 AM  Result Value Ref Range   Prothrombin Time 27.2 (H) 11.4 - 15.2 seconds   INR 2.5 (H) 0.8 - 1.2    Comment: (NOTE) INR goal varies based on device and disease states. Performed at [facility], [place] [address] [place], [place] [address]   Procalcitonin     Status: None   Collection Time: 10/10/19  2:08 AM  Result Value Ref Range   Procalcitonin 1.27 ng/mL    Comment:        Interpretation: PCT > 0.5 ng/mL and <= 2 ng/mL: Systemic infection (sepsis) is possible, but other conditions are known to elevate PCT as well. (NOTE)       Sepsis PCT Algorithm           Lower Respiratory Tract                                      Infection PCT Algorithm     ----------------------------     ----------------------------         PCT < 0.25 ng/mL                PCT < 0.10 ng/mL         Strongly encourage             Strongly discourage   discontinuation of antibiotics    initiation of antibiotics    ----------------------------     -----------------------------       PCT 0.25 - 0.50 ng/mL            PCT 0.10 - 0.25 ng/mL               OR       >80% decrease in PCT            Discourage initiation of                                            antibiotics      Encourage discontinuation           of antibiotics    ----------------------------     -----------------------------         PCT >= 0.50 ng/mL              PCT 0.26 - 0.50 ng/mL                AND       <80% decrease in PCT             Encourage initiation of  antibiotics       Encourage continuation           of antibiotics    ----------------------------     -----------------------------        PCT >= 0.50 ng/mL                  PCT > 0.50 ng/mL               AND         increase in PCT                  Strongly encourage                                      initiation of antibiotics    Strongly encourage escalation           of antibiotics                                     -----------------------------                                           PCT <= 0.25 ng/mL                                                 OR                                        > 80% decrease in PCT                                     Discontinue / Do not initiate                                             antibiotics Performed at [facility], [address], [place], [place] [address]   Culture, blood (routine x 2)     Status: None (Preliminary result)   Collection Time: 10/10/19  2:46 AM   Specimen: BLOOD  Result Value Ref Range   Specimen Description BLOOD LEFT WRIST    Special Requests      BOTTLES DRAWN AEROBIC AND ANAEROBIC Blood Culture adequate  volume   Culture      NO GROWTH < 12 HOURS Performed at [facility], [place] [address] [place],  [address]    Report Status PENDING   Glucose, capillary     Status: Abnormal   Collection Time: 10/10/19  7:49 AM  Result Value Ref Range   Glucose-Capillary 160 (H) 70 - 99 mg/dL    Comment: Glucose reference range applies only to samples taken after fasting for at least 8 hours.  Surgical pcr screen     Status: None   Collection Time: 10/10/19  8:16 AM  Specimen: Nasal Mucosa; Nasal Swab  Result Value Ref Range   MRSA, PCR NEGATIVE NEGATIVE   Staphylococcus aureus NEGATIVE NEGATIVE    Comment: (NOTE) The Xpert SA Assay (FDA approved for NASAL specimens in patients 56 years of age and older), is one component of a comprehensive surveillance program. It is not intended to diagnose infection nor to guide or monitor treatment. Performed at [facility], [place] [address], [place], [place] [address]   Lactic acid, plasma     Status: None   Collection Time: 10/10/19  8:30 AM  Result Value Ref Range   Lactic Acid, Venous 1.9 0.5 - 1.9 mmol/L    Comment: Performed at [facility] [address], [place], [place] [address]  CBC     Status: Abnormal   Collection Time: 10/10/19  8:31 AM  Result Value Ref Range   WBC 10.6 (H) 4.0 - 10.5 K/uL   RBC 2.14 (L) 4.22 - 5.81 MIL/uL   Hemoglobin 6.1 (LL) 13.0 - 17.0 g/dL    Comment: REPEATED TO VERIFY THIS CRITICAL RESULT HAS VERIFIED AND BEEN CALLED TO N. NAERLENDER, RN BY JULIE MACEDA DEL ANGEL ON 04 02 2021 AT 1011, AND HAS BEEN READ BACK.     HCT 18.5 (L) 39.0 - 52.0 %   MCV 86.4 80.0 - 100.0 fL   MCH 28.5 26.0 - 34.0 pg   MCHC 33.0 30.0 - 36.0 g/dL   RDW 17.2 (H) 11.5 - 15.5 %   Platelets PLATELET CLUMPS NOTED ON SMEAR, UNABLE TO ESTIMATE 150 - 400 K/uL    Comment: Immature Platelet Fraction may be clinically indicated, consider ordering this additional test [id number]    nRBC 0.0 0.0 - 0.2 %    Comment:  Performed at [facility], [place] [address], [place], [place] [address]  Comprehensive metabolic panel     Status: Abnormal   Collection Time: 10/10/19  8:31 AM  Result Value Ref Range   Sodium 135 135 - 145 mmol/L   Potassium 5.0 3.5 - 5.1 mmol/L   Chloride 106 98 - 111 mmol/L   CO2 21 (L) 22 - 32 mmol/L   Glucose, Bld 172 (H) 70 - 99 mg/dL    Comment: Glucose reference range applies only to samples taken after fasting for at least 8 hours.   BUN 31 (H) 6 - 20 mg/dL   Creatinine, Ser 2.56 (H) 0.61 - 1.24 mg/dL   Calcium 7.9 (L) 8.9 - 10.3 mg/dL   Total Protein 4.3 (L) 6.5 - 8.1 g/dL   Albumin 1.9 (L) 3.5 - 5.0 g/dL   AST 1,944 (H) 15 - 41 U/L   ALT 684 (H) 0 - 44 U/L   Alkaline Phosphatase 105 38 - 126 U/L   Total Bilirubin 2.5 (H) 0.3 - 1.2 mg/dL   GFR calc non Af Amer 28 (L) >60 mL/min   GFR calc Af Amer 32 (L) >60 mL/min   Anion gap 8 5 - 15    Comment: Performed at [facility] [address] [place], [place] [address]  Protime-INR     Status: Abnormal   Collection Time: 10/10/19  8:31 AM  Result Value Ref Range   Prothrombin Time 25.3 (H) 11.4 - 15.2 seconds   INR 2.3 (H) 0.8 - 1.2    Comment: (NOTE) INR goal varies based on device and disease states. Performed at [facility], [place] [address] [place], [place] [address]   Cortisol  Status: None   Collection Time: 10/10/19  8:32 AM  Result Value Ref Range   Cortisol, Plasma 20.5 ug/dL    Comment: (NOTE) AM    6.7 - 22.6 ug/dL PM   <10.0       ug/dL Performed at [facility] [address] [place], [place] [address]   Prepare RBC (crossmatch)     Status: None   Collection Time: 10/10/19 10:28 AM  Result Value Ref Range   Order Confirmation      ORDER PROCESSED BY BLOOD BANK Performed at [facility], [place] [address], [place], [place] [address]   Prepare cryoprecipitate     Status: None (Preliminary result)   Collection Time: 10/10/19 10:28 AM  Result Value Ref Range   Unit  Number [id number]    Blood Component Type CRYPOOL THAW    Unit division 00    Status of Unit ISSUED    Transfusion Status      OK TO TRANSFUSE Performed at [facility] [address] [place], [place] [address]   Glucose, capillary     Status: Abnormal   Collection Time: 10/10/19 11:43 AM  Result Value Ref Range   Glucose-Capillary 181 (H) 70 - 99 mg/dL    Comment: Glucose reference range applies only to samples taken after fasting for at least 8 hours.    No results found.  Review of Systems negative except above Blood pressure 117/61, pulse (!) 107, temperature 97.8 F (36.6 C), temperature source Axillary, resp. rate 19, height 6' 2"  (1.88 m), weight (!) 148.3 kg, SpO2 97 %. Physical Exam patient lying comfortably in the bed not examined today labs and previous CT reviewed as well as previous work-up  Assessment/Plan: Multiple medical problems including probable ATN and shock liver on top of cirrhosis hopefully not hepatorenal Plan: Appreciate kidney doctors note and will continue to follow and will check on tomorrow and please call me sooner if any specific question or problem that I could help with and the patient and his wife thanked me for my time and I gave them my name and phone number for probable outpatient follow-up  [place] E 10/10/2019, 2:41 PM

## 2019-10-10 NOTE — Progress Notes (Addendum)
PROGRESS NOTE    Robert Sullivan  MPN:[id number] DOB: 1965/08/29 DOA: 10/08/2019 PCP: Cathleen Corti, PA-C      Brief Narrative:  Robert Sullivan is a 54 y.o. M with hx MO BMI 49, DM, OSA not on CPAP, new diagnosis likely NASH cirrhosis (although elevated LFTs as far back as 2019) as well as recurrent hip infection and recent MSSA bacteremia who presented for hip I&D again, and for whom we are consulted for medical management.  The patient initially developed a gluteal abscess and MSSA bacteremia last Dec 2020.  The subsequent clinical course has been complicated by recurrent infection of the surgical wound and is better summarized in Dr. Hale Bogus ID note of 4/1.          Assessment & Plan:  Wound dehiscence Possible sepsis due to wound infection Tachycardia, leukocytosis and elevated lactic acid as well as liver failure.  Source likely hip, species pending, on broad spectrum Abx, CCM consulted overnight. -Continue cefepime, day 1 -Follow blood cultures from 4/2 -Follow wound culture from 3/31 -Consult ID, appreciate cares -Consult critical care, appreciate cares   Cirrhosis LFTs elevated since 2019 and CT imaging early Mar 2021 showing stigmata of chronic liver disease.  Hep C negative, no significant EtOH, suspect NASH.    At present, he appears decompensating.  LFTs markedly up today. Albumin 1.8, INR 2.5.  MELD 11 at initial admission last fall, 32 now.  Immunosuppression and liver dysfunction from cirrhosis likely explains his recurrent infections, and also poor wound healing.  Unfortunately, he is not a transplant candidate given weight, and there are no disease specific treatments that will reverse his grim course.  No evidence of encephlaopthy on my exam. -Consult GI, appreciate cares -Continue Ensure  Renal failure Hyperkalemia Acidosis, elevated gap Baseline Cr <1, currently up to 2.7 this morning, trending up despite transfusion and albumin last night. -Check  UA -Place foley, strict I/Os -Consult Nephrology re: HRS?/midodrine/octreo -Lokelma given, repeat K  Morbid obesity BMI 49, hx diabetes.  Diabetes Glucoses low. -Continue SSI  Anemia of acute blood loss on anemia of chronic disease S/p 1u PRBCs 3/31, 4u PRBCs 4/1 and 1u FFP and 1pack platelets 4/2. -Continue iron  ADDENDUM: Post-transfusion H/H still <7 g/dL.  Will transfuse 1 more unit PRBCs and 1 pool cryo to keep 5:1 ratio PRBCs to plasma factors (but avoid FFP due to concerns for portal HTN)  Other medications -Hold aspirin  Moderate protein calorie malnutrition As evidenced by muscle and fat depletion, thenar wasting, chronic disease and recurrent infection and liver failure and albumin 1.8. -Consult dietitian             Disposition: The patient was admitted for debridement of hip.  He is developing AKI and worsneing liver function (rising INR, worsening albumin, fluid overload, coagulopathy).    I will discharge when his liver function has stabilized, his renal function has improved and he has no more planned surgeries.        MDM: The below labs and imaging reports were reviewed and summarized above.  Medication management as above.  This is a sever organ failure with threat to life and bodily function.   DVT prophylaxis: SCDs Code Status: FULL Family Communication: Wife at bedside    Consultants:   Robert Sullivan  ID  GI  Nephrology  Procedures:   3/31 excisional debridement by Dr. Sharol Given    Antimicrobials:   Ampicillin 3/31 >> 4/1   Cefepime 4/2 >>   Culture  data:   3/31 wound culture -- GNRs  4/2 blood cultures -- pending           Subjective: The patient is weak, but no chest pain, abdominal pain.  His wound is oozing a lot and bP has been low overnight.  No fever.  No confusion.    Objective: Vitals:   10/10/19 0515 10/10/19 0700 10/10/19 0710 10/10/19 0751  BP: (!) 102/43 (!) 94/47 (!) 94/42  (!) 104/50  Pulse: (!) 107 (!) 104 (!) 103 (!) 103  Resp: 16 16 15 15   Temp: 99 F (37.2 C) 98.6 F (37 C)  97.7 F (36.5 C)  TempSrc: Oral Oral  Oral  SpO2: 96% 96% 97% 97%  Weight:      Height:        Intake/Output Summary (Last 24 hours) at 10/10/2019 0955 Last data filed at 10/10/2019 0938 Gross per 24 hour  Intake 2181 ml  Output 675 ml  Net 1506 ml   Filed Weights   10/08/19 1140  Weight: (!) 148.3 kg    Examination: General appearance: obese adult male, alert and in no acute distress.  Appears tired, weak HEENT: Anicteric, conjunctiva pink, lids and lashes normal. No nasal deformity, discharge, epistaxis.  Lips moist, dentition normal, OP moist, no oral elesions, hearing normal.   Skin: Warm and dry.  No jaundice that I see.  No suspicious rashes or lesions.  There is bloody drainage from wound, but no surrounding cellulitis Cardiac: Tachycardic, regular, nl S1-S2, no murmurs appreciated.  Capillary refill is brisk.  JVP not visible.  2+ LE edema.  Radial pulses 2+ and symmetric. Respiratory: Normal respiratory rate and rhythm.  CTAB without rales or wheezes. Abdomen: Abdomen soft.  No TTP. No ascites, distension, hepatosplenomegaly.   MSK: No deformities or effusions.  Thenar wasting. Neuro: Awake and alert.  EOMI, moves all extremities with generalized weakness, left hip limited by pain. Speech fluent.    Psych: Sensorium intact and responding to questions, attention normal. Affect normal.  Judgment and insight appear normal.    Data Reviewed: I have personally reviewed following labs and imaging studies:  CBC: Recent Labs  Lab 10/06/19 1015 10/06/19 1015 10/08/19 1816 10/09/19 0101 10/09/19 1129 10/09/19 1643 10/10/19 0208  WBC 4.9  --   --  17.8*  --  27.3* 19.0*  NEUTROABS 3.3  --   --   --   --   --   --   HGB 8.4*   < > 6.9* 7.8* 7.3* 7.3* 7.8*  HCT 27.2*   < > 23.0* 24.6* 22.7* 23.0* 23.6*  MCV 85.5  --   --  84.5  --  87.8 87.1  PLT 104*  --   --   238  --  323 167   < > = values in this interval not displayed.   Basic Metabolic Panel: Recent Labs  Lab 10/06/19 1015 10/09/19 1643 10/10/19 0208 10/10/19 0831  NA 136 134* 134* 135  K 3.5 6.0* 5.9* 5.0  CL 104 102 106 106  CO2 25 14* 17* 21*  GLUCOSE 113* 149* 195* 172*  BUN 15 26* 29* 31*  CREATININE 0.70 2.38* 2.73* 2.56*  CALCIUM 7.9* 8.3* 8.1* 7.9*   GFR: Estimated Creatinine Clearance: 51.8 mL/min (A) (by C-G formula based on SCr of 2.56 mg/dL (H)). Liver Function Tests: Recent Labs  Lab 10/09/19 1643 10/10/19 0208 10/10/19 0831  AST 449* 1,855* 1,944*  ALT 178* 668* 684*  ALKPHOS 91 109 105  BILITOT 2.8* 2.9* 2.5*  PROT 4.6* 4.4* 4.3*  ALBUMIN 1.9* 1.8* 1.9*   No results for input(s): LIPASE, AMYLASE in the last 168 hours. No results for input(s): AMMONIA in the last 168 hours. Coagulation Profile: Recent Labs  Lab 10/09/19 1643 10/10/19 0208 10/10/19 0831  INR 2.5* 2.5* 2.3*   Cardiac Enzymes: No results for input(s): CKTOTAL, CKMB, CKMBINDEX, TROPONINI in the last 168 hours. BNP (last 3 results) No results for input(s): PROBNP in the last 8760 hours. HbA1C: No results for input(s): HGBA1C in the last 72 hours. CBG: Recent Labs  Lab 10/08/19 1137 10/08/19 1400 10/09/19 1727 10/09/19 2356 10/10/19 0749  GLUCAP 105* 94 127* 191* 160*   Lipid Profile: No results for input(s): CHOL, HDL, LDLCALC, TRIG, CHOLHDL, LDLDIRECT in the last 72 hours. Thyroid Function Tests: No results for input(s): TSH, T4TOTAL, FREET4, T3FREE, THYROIDAB in the last 72 hours. Anemia Panel: No results for input(s): VITAMINB12, FOLATE, FERRITIN, TIBC, IRON, RETICCTPCT in the last 72 hours. Urine analysis:    Component Value Date/Time   COLORURINE AMBER (A) 10/10/2019 0100   APPEARANCEUR CLOUDY (A) 10/10/2019 0100   LABSPEC 1.030 10/10/2019 0100   PHURINE 5.0 10/10/2019 0100   GLUCOSEU 50 (A) 10/10/2019 0100   HGBUR LARGE (A) 10/10/2019 0100   BILIRUBINUR NEGATIVE  10/10/2019 0100   KETONESUR NEGATIVE 10/10/2019 0100   PROTEINUR 100 (A) 10/10/2019 0100   NITRITE NEGATIVE 10/10/2019 0100   LEUKOCYTESUR TRACE (A) 10/10/2019 0100   Sepsis Labs: @LABRCNTIP (procalcitonin:4,lacticacidven:4)  ) Recent Results (from the past 240 hour(s))  Respiratory Panel by RT PCR (Flu A&B, Covid) - Nasopharyngeal Swab     Status: None   Collection Time: 10/08/19 11:41 AM   Specimen: Nasopharyngeal Swab  Result Value Ref Range Status   SARS Coronavirus 2 by RT PCR NEGATIVE NEGATIVE Final    Comment: (NOTE) SARS-CoV-2 target nucleic acids are NOT DETECTED. The SARS-CoV-2 RNA is generally detectable in upper respiratoy specimens during the acute phase of infection. The lowest concentration of SARS-CoV-2 viral copies this assay can detect is 131 copies/mL. A negative result does not preclude SARS-Cov-2 infection and should not be used as the sole basis for treatment or other patient management decisions. A negative result may occur with  improper specimen collection/handling, submission of specimen other than nasopharyngeal swab, presence of viral mutation(s) within the areas targeted by this assay, and inadequate number of viral copies (<131 copies/mL). A negative result must be combined with clinical observations, patient history, and epidemiological information. The expected result is Negative. Fact Sheet for Patients:  PinkCheek.be Fact Sheet for Healthcare Providers:  GravelBags.it This test is not yet ap proved or cleared by the [place] FDA and  has been authorized for detection and/or diagnosis of SARS-CoV-2 by FDA under an Emergency Use Authorization (EUA). This EUA will remain  in effect (meaning this test can be used) for the duration of the COVID-19 declaration under Section 564(b)(1) of the Act, 21 U.S.C. section 360bbb-3(b)(1), unless the authorization is terminated or revoked sooner.      Influenza A by PCR NEGATIVE NEGATIVE Final   Influenza B by PCR NEGATIVE NEGATIVE Final    Comment: (NOTE) The Xpert Xpress SARS-CoV-2/FLU/RSV assay is intended as an aid in  the diagnosis of influenza from Nasopharyngeal swab specimens and  should not be used as a sole basis for treatment. Nasal washings and  aspirates are unacceptable for Xpert Xpress SARS-CoV-2/FLU/RSV  testing. Fact Sheet for Patients: PinkCheek.be Fact Sheet for Healthcare Providers: GravelBags.it  This test is not yet approved or cleared by the [place] and  has been authorized for detection and/or diagnosis of SARS-CoV-2 by  FDA under an Emergency Use Authorization (EUA). This EUA will remain  in effect (meaning this test can be used) for the duration of the  Covid-19 declaration under Section 564(b)(1) of the Act, 21  U.S.C. section 360bbb-3(b)(1), unless the authorization is  terminated or revoked. Performed at [facility], [place] [address], [place], [place] [address]   Aerobic/Anaerobic Culture (surgical/deep wound)     Status: None (Preliminary result)   Collection Time: 10/08/19  1:20 PM   Specimen: Wound  Result Value Ref Range Status   Specimen Description WOUND  Final   Special Requests LEFT HIP  Final   Gram Stain   Final    ABUNDANT WBC PRESENT,BOTH PMN AND MONONUCLEAR NO ORGANISMS SEEN    Culture   Final    MODERATE GRAM NEGATIVE RODS IDENTIFICATION AND SUSCEPTIBILITIES TO FOLLOW Performed at [facility], [place] [address], [place],  [address]    Report Status PENDING  Incomplete         Radiology Studies: No results found.      Scheduled Meds: . sodium chloride   Intravenous Once  . Chlorhexidine Gluconate Cloth  6 each Topical Daily  . ferrous sulfate  325 mg Oral Q breakfast  . insulin aspart  0-15 Units Subcutaneous TID WC  . Ensure Max Protein  11 oz Oral Daily  . sodium chloride flush   10-40 mL Intracatheter Q12H  . sodium zirconium cyclosilicate  10 g Oral Once   Continuous Infusions: . sodium chloride 125 mL/hr at 10/10/19 0953  . sodium chloride    .  ceFAZolin (ANCEF) IV    . ceFEPime (MAXIPIME) IV    .  sodium bicarbonate  infusion 1000 mL 50 mL/hr at 10/10/19 0637     LOS: 2 days    Time spent: 35 minutes    Edwin Dada, MD [facility] 10/10/2019, 9:55 AM     Please page though [place] or Epic secure chat:  For [employer], Adult nurse

## 2019-10-10 NOTE — Consult Note (Signed)
[name] Renal Consultation Note  Requesting MD:  Indication for Consultation: Acute kidney injury, maintenance of euvolemia, assessment and treatment of electrolyte disorder, assessment and treatment of acid-base disorder.  HPI: Robert Sullivan is a 54 y.o. male.  With a history of diabetes nonalcoholic steatohepatitis or NASH cirrhosis recent hip infection with recent MSSA bacteremia.  Presented for hip I&D with abscess of left hip and possible sepsis due to wound infection.  He underwent debridement 10/08/2019.  Baseline creatinine less than 1 mg/dL 10/06/2019 increased to 2.38 mg/dL 10/09/2019 and 2.56 10/10/2019.  Urine output 150 cc 10/07/2019 150 cc 10/09/2019.  Appears to have decompensated liver cirrhosis with a meld score of 32.  Albumin 1.8 INR 2.5 increasing LFTs.     Urinalysis shows urine sodium < 10 10/10/2019 urine osmolality 320 urine sediment greater than 50 red blood cells per high-powered film 100 mg/dL protein  Blood pressure 103/48 pulse 105 temperature 98.6 O2 sats 100% half liter nasal cannula  Sodium 135 potassium 5.0 chloride 106 CO2 21 BUN 31 creatinine 2.56 glucose 172 calcium 7.9 albumin 1.9 AST 1944 ALT 684 hemoglobin 6.1 WBC 10.6  Insulin sliding scale, iron sulfate 325 mg daily, Lokelma 10 g x 1, Maxipime 2 g every 12 hours  sodium chloride 125 cc an hour    Creatinine, Ser  Date/Time Value Ref Range Status  10/10/2019 08:31 AM 2.56 (H) 0.61 - 1.24 mg/dL Final  10/10/2019 02:08 AM 2.73 (H) 0.61 - 1.24 mg/dL Final  10/09/2019 04:43 PM 2.38 (H) 0.61 - 1.24 mg/dL Final  10/06/2019 10:15 AM 0.70 0.61 - 1.24 mg/dL Final  09/29/2019 02:30 PM 0.72 0.61 - 1.24 mg/dL Final  09/25/2019 03:45 AM 0.84 0.61 - 1.24 mg/dL Final  09/24/2019 04:45 AM 0.83 0.61 - 1.24 mg/dL Final  09/23/2019 02:40 AM 0.88 0.61 - 1.24 mg/dL Final  09/22/2019 03:36 AM 0.84 0.61 - 1.24 mg/dL Final  09/21/2019 08:35 AM 0.82 0.61 - 1.24 mg/dL Final  09/20/2019 03:07 AM 0.97 0.61 - 1.24  mg/dL Final  09/19/2019 09:24 PM 0.94 0.61 - 1.24 mg/dL Final  09/19/2019 04:12 AM 0.75 0.61 - 1.24 mg/dL Final  09/18/2019 05:09 AM 0.90 0.61 - 1.24 mg/dL Final  09/17/2019 04:10 AM 0.73 0.61 - 1.24 mg/dL Final  09/16/2019 03:46 AM 0.78 0.61 - 1.24 mg/dL Final  09/15/2019 02:40 AM 0.62 0.61 - 1.24 mg/dL Final  09/14/2019 03:53 AM 0.66 0.61 - 1.24 mg/dL Final  09/13/2019 05:13 AM 0.66 0.61 - 1.24 mg/dL Final  09/12/2019 07:44 AM 0.72 0.61 - 1.24 mg/dL Final  09/11/2019 05:02 PM 0.77 0.61 - 1.24 mg/dL Final  07/17/2019 03:59 AM 0.84 0.61 - 1.24 mg/dL Final  07/16/2019 01:52 PM 0.60 (L) 0.61 - 1.24 mg/dL Final  07/15/2019 03:36 AM 0.64 0.61 - 1.24 mg/dL Final  07/12/2019 04:17 AM 0.47 (L) 0.61 - 1.24 mg/dL Final  07/11/2019 03:31 AM 0.56 (L) 0.61 - 1.24 mg/dL Final  07/10/2019 12:36 AM 0.55 (L) 0.61 - 1.24 mg/dL Final  07/08/2019 08:42 PM 0.52 (L) 0.61 - 1.24 mg/dL Final  06/30/2019 05:09 AM 0.65 0.61 - 1.24 mg/dL Final  06/29/2019 03:50 AM 0.60 (L) 0.61 - 1.24 mg/dL Final  06/28/2019 04:12 AM 0.62 0.61 - 1.24 mg/dL Final  06/27/2019 04:50 AM 0.57 (L) 0.61 - 1.24 mg/dL Final  06/26/2019 04:29 AM 0.66 0.61 - 1.24 mg/dL Final  06/25/2019 03:51 AM 0.66 0.61 - 1.24 mg/dL Final  06/24/2019 10:40 AM 0.71 0.61 - 1.24 mg/dL Final  06/24/2019 04:41 AM 0.74 0.61 -  1.24 mg/dL Final  06/23/2019 02:54 AM 0.62 0.61 - 1.24 mg/dL Final  06/22/2019 03:56 AM 0.48 (L) 0.61 - 1.24 mg/dL Final  06/21/2019 03:27 AM 0.51 (L) 0.61 - 1.24 mg/dL Final  06/19/2019 04:30 AM 0.49 (L) 0.61 - 1.24 mg/dL Final  06/18/2019 02:11 AM 0.55 (L) 0.61 - 1.24 mg/dL Final  06/17/2019 06:11 AM 0.59 (L) 0.61 - 1.24 mg/dL Final  06/16/2019 05:12 AM 0.72 0.61 - 1.24 mg/dL Final  06/15/2019 05:45 AM 0.62 0.61 - 1.24 mg/dL Final  06/14/2019 04:42 AM 0.76 0.61 - 1.24 mg/dL Final  06/13/2019 02:36 AM 0.78 0.61 - 1.24 mg/dL Final  06/12/2019 03:18 AM 0.90 0.61 - 1.24 mg/dL Final  06/11/2019 07:21 AM 0.87 0.61 - 1.24 mg/dL Final   06/10/2019 11:29 AM 1.09 0.61 - 1.24 mg/dL Final  05/15/2019 05:42 AM 0.59 (L) 0.61 - 1.24 mg/dL Final  05/14/2019 04:08 AM 0.71 0.61 - 1.24 mg/dL Final  05/13/2019 03:56 AM 0.71 0.61 - 1.24 mg/dL Final     PMHx:   Past Medical History:  Diagnosis Date  . Anemia   . Anxiety   . Arthritis 05/13/2019   knees  . Cirrhosis ([place])   . Depression   . Diabetes mellitus without complication ([place])   . DVT (deep venous thrombosis) ([place])   . Gallstones   . Gluteal abscess   . Headache   . HTN (hypertension)   . Hyperlipidemia   . Sleep apnea    doesn't use cpap  . Splenomegaly     Past Surgical History:  Procedure Laterality Date  . I & D EXTREMITY Left 07/16/2019   Procedure: LEFT HIP DEBRIDEMENT;  Surgeon: Newt Minion, MD;  Location: [facility];  Service: Orthopedics;  Laterality: Left;  . I & D EXTREMITY Left 07/18/2019   Procedure: REPEAT IRRIGATION AND DEBRIDEMENT LEFT HIP;  Surgeon: Newt Minion, MD;  Location: [facility];  Service: Orthopedics;  Laterality: Left;  . I & D EXTREMITY Left 09/19/2019   Procedure: DEBRIDEMENT LEFT HIP Appilcattion OF WOUND VAC;  Surgeon: Newt Minion, MD;  Location: [facility];  Service: Orthopedics;  Laterality: Left;  . I & D EXTREMITY Left 10/08/2019   Procedure: IRRIGATION AND DEBRIDEMENT, REVISION LEFT HIP WOUND;  Surgeon: Newt Minion, MD;  Location: [place];  Service: Orthopedics;  Laterality: Left;  . INCISION AND DRAINAGE ABSCESS Left 06/23/2019   Procedure: INCISION AND DRAINAGE GLUTEAL ABSCESS;  Surgeon: Renette Butters, MD;  Location: [facility];  Service: Orthopedics;  Laterality: Left;  . INCISION AND DRAINAGE ABSCESS Left 07/09/2019   Procedure: INCISION AND DRAINAGE LEFT HIP/PELVIC ABSCESS, APPLICATION OF NEGATIVE PRESSURE WOUND VAC;  Surgeon: Newt Minion, MD;  Location: [facility];  Service: Orthopedics;  Laterality: Left;  . INCISION AND DRAINAGE HIP Left 06/25/2019   Procedure: IRRIGATION AND DEBRIDEMENT HIP;  Surgeon: Shona Needles, MD;   Location: [facility];  Service: Orthopedics;  Laterality: Left;  . INCISION AND DRAINAGE OF WOUND Left 07/12/2019   Procedure: LEFT HIP DEBRIDEMENT;  Surgeon: Newt Minion, MD;  Location: [facility];  Service: Orthopedics;  Laterality: Left;  . IR PARACENTESIS  09/13/2019  . IR US GUIDE BX ASP/DRAIN  06/17/2019  . IR US GUIDE BX ASP/DRAIN  09/13/2019  . KNEE ARTHROSCOPY Bilateral    left x 2, 1 right  . LAPAROSCOPIC CHOLECYSTECTOMY  ~ 2010  . LUMBAR DISC SURGERY    . NASAL SINUS SURGERY    . TEE WITHOUT CARDIOVERSION N/A 06/16/2019  Procedure: TRANSESOPHAGEAL ECHOCARDIOGRAM (TEE);  Surgeon: Dorothy Spark, MD;  Location: [facility];  Service: Cardiovascular;  Laterality: N/A;    Family Hx:  Family History  Problem Relation Age of Onset  . Hypertension Father     Social History:  reports that he quit smoking about 2 years ago. His smoking use included cigars. He has never used smokeless tobacco. He reports previous alcohol use. He reports previous drug use.  Allergies:  Allergies  Allergen Reactions  . Sulfa Antibiotics Rash and Other (See Comments)    Total body rash    Medications: Prior to Admission medications   Medication Sig Start Date End Date Taking? Authorizing Provider  aspirin 325 MG tablet Take 1 tablet (325 mg total) by mouth daily. Patient taking differently: Take 325 mg by mouth as needed for mild pain or headache.  07/25/19  Yes Persons, Bevely Palmer, PA  Ensure Max Protein (ENSURE MAX PROTEIN) LIQD Take 330 mLs (11 oz total) by mouth daily. 07/01/19  Yes Hongalgi, Lenis Dickinson, MD  ferrous sulfate 325 (65 FE) MG tablet Take 1 tablet (325 mg total) by mouth daily with breakfast. 09/24/19 10/24/19 Yes Kyle, Tyrone A, DO  furosemide (LASIX) 40 MG tablet Take 1 tablet (40 mg total) by mouth daily. 09/24/19 10/24/19 Yes Kyle, Tyrone A, DO  insulin aspart (NOVOLOG) 100 UNIT/ML injection Inject 1 Units into the skin 2 (two) times daily as needed (only if BGL is 120 or greater).    Yes  [provider]  insulin glargine (LANTUS) 100 UNIT/ML injection Inject 0.24 mLs (24 Units total) into the skin daily. Patient taking differently: Inject 15 Units into the skin daily as needed (only if BGL is 120 or greater).  07/01/19  Yes Hongalgi, Lenis Dickinson, MD  Multiple Vitamin (MULTIVITAMIN WITH MINERALS) TABS tablet Take 1 tablet by mouth daily. 07/01/19  Yes Hongalgi, Lenis Dickinson, MD  naproxen sodium (ALEVE) 220 MG tablet Take 220-440 mg by mouth 2 (two) times daily as needed (for pain).   Yes [provider]  potassium chloride SA (KLOR-CON) 20 MEQ tablet Take 2 tablets (40 mEq total) by mouth daily. 09/25/19 10/25/19 Yes Kyle, Tyrone A, DO  spironolactone (ALDACTONE) 100 MG tablet Take 1 tablet (100 mg total) by mouth daily. 09/25/19 10/25/19 Yes Kyle, Tyrone A, DO  blood glucose meter kit and supplies KIT Dispense based on patient and insurance preference. Use up to four times daily as directed. (FOR ICD-9 250.00, 250.01). 06/30/19   Modena Jansky, MD  Insulin Syringes, Disposable, U-100 0.5 ML MISC Use as directed 3 times daily AC. 06/30/19   Hongalgi, Lenis Dickinson, MD      Labs:  Results for orders placed or performed during the hospital encounter of 10/08/19 (from the past 48 hour(s))  Aerobic/Anaerobic Culture (surgical/deep wound)     Status: None (Preliminary result)   Collection Time: 10/08/19  1:20 PM   Specimen: Wound  Result Value Ref Range   Specimen Description WOUND    Special Requests LEFT HIP    Gram Stain      ABUNDANT WBC PRESENT,BOTH PMN AND MONONUCLEAR NO ORGANISMS SEEN Performed at [facility], [place] [address], [place], [place] [address]    Culture      MODERATE ENTEROBACTER SPECIES NO ANAEROBES ISOLATED; CULTURE IN PROGRESS FOR 5 DAYS    Report Status PENDING    Organism ID, Bacteria ENTEROBACTER SPECIES       Susceptibility   Enterobacter species - MIC*  CEFAZOLIN >=64 RESISTANT Resistant     CEFEPIME <=0.12 SENSITIVE Sensitive      CEFTAZIDIME <=1 SENSITIVE Sensitive     CIPROFLOXACIN <=0.25 SENSITIVE Sensitive     GENTAMICIN <=1 SENSITIVE Sensitive     IMIPENEM <=0.25 SENSITIVE Sensitive     TRIMETH/SULFA <=20 SENSITIVE Sensitive     PIP/TAZO <=4 SENSITIVE Sensitive     * MODERATE ENTEROBACTER SPECIES  Glucose, capillary     Status: None   Collection Time: 10/08/19  2:00 PM  Result Value Ref Range   Glucose-Capillary 94 70 - 99 mg/dL    Comment: Glucose reference range applies only to samples taken after fasting for at least 8 hours.   Comment 1 Notify RN   Type and screen [place]     Status: None (Preliminary result)   Collection Time: 10/08/19  5:55 PM  Result Value Ref Range   ABO/RH(D) A POS    Antibody Screen NEG    Sample Expiration 10/11/2019,2359    Unit Number [id number]    Blood Component Type RED CELLS,LR    Unit division 00    Status of Unit ISSUED,FINAL    Transfusion Status OK TO TRANSFUSE    Crossmatch Result Compatible    Unit Number [id number]    Blood Component Type RED CELLS,LR    Unit division 00    Status of Unit ISSUED,FINAL    Transfusion Status OK TO TRANSFUSE    Crossmatch Result Compatible    Unit Number [id number]    Blood Component Type RED CELLS,LR    Unit division 00    Status of Unit ISSUED,FINAL    Transfusion Status OK TO TRANSFUSE    Crossmatch Result Compatible    Unit Number [id number]    Blood Component Type RED CELLS,LR    Unit division 00    Status of Unit ISSUED,FINAL    Transfusion Status OK TO TRANSFUSE    Crossmatch Result Compatible    Unit Number [id number]    Blood Component Type RED CELLS,LR    Unit division 00    Status of Unit ISSUED,FINAL    Transfusion Status OK TO TRANSFUSE    Crossmatch Result      Compatible Performed at [facility], [place] [address] [place], [place] [address]    Unit Number [id number]    Blood Component Type RED CELLS,LR    Unit division 00    Status of Unit ALLOCATED     Transfusion Status OK TO TRANSFUSE    Crossmatch Result Compatible    Unit Number [id number]    Blood Component Type RED CELLS,LR    Unit division 00    Status of Unit ALLOCATED    Transfusion Status OK TO TRANSFUSE    Crossmatch Result Compatible    Unit Number [id number]    Blood Component Type RED CELLS,LR    Unit division 00    Status of Unit ALLOCATED    Transfusion Status OK TO TRANSFUSE    Crossmatch Result Compatible    Unit Number [id number]    Blood Component Type RED CELLS,LR    Unit division 00    Status of Unit ALLOCATED    Transfusion Status OK TO TRANSFUSE    Crossmatch Result Compatible   Prepare RBC (crossmatch)     Status: None   Collection Time: 10/08/19  6:00 PM  Result Value Ref Range   Order Confirmation      ORDER PROCESSED BY BLOOD BANK Performed at [facility], [place] [address], [place], [place] [address]   Hemoglobin and Hematocrit     Status: Abnormal   Collection Time: 10/08/19  6:16 PM  Result Value Ref Range   Hemoglobin 6.9 (LL) 13.0 - 17.0 g/dL    Comment: This critical result has verified and been called to C HARRIS,RN by Red Christians on 03 31 2021 at 1902, and has been read back.  REPEATED TO VERIFY CORRECTED ON 03/31 AT 1906: PREVIOUSLY REPORTED AS 6.9 This critical result has verified and been called to C HARRIS,RN by Red Christians on 03 31 2021 at 1902, and has been read back.     HCT 23.0 (L) 39.0 - 52.0 %    Comment: Performed at [facility], [place] [address], [place], [place] [address]  CBC     Status: Abnormal   Collection Time: 10/09/19  1:01 AM  Result Value Ref Range   WBC 17.8 (H) 4.0 - 10.5 K/uL   RBC 2.91 (L) 4.22 - 5.81 MIL/uL   Hemoglobin 7.8 (L) 13.0 - 17.0 g/dL   HCT 24.6 (L) 39.0 - 52.0 %   MCV 84.5 80.0 - 100.0 fL   MCH 26.8 26.0 - 34.0 pg   MCHC 31.7 30.0 - 36.0 g/dL   RDW 18.3 (H) 11.5 - 15.5 %   Platelets 238 150 - 400 K/uL   nRBC 0.0 0.0 - 0.2 %    Comment: Performed at [facility],  [place] [address], [place], [place] [address]  Prepare RBC (crossmatch)     Status: None   Collection Time: 10/09/19  1:46 AM  Result Value Ref Range   Order Confirmation      ORDER PROCESSED BY BLOOD BANK Performed at [facility], [place] [address], [place], [place] [address]   Hemoglobin and hematocrit, blood     Status: Abnormal   Collection Time: 10/09/19 11:29 AM  Result Value Ref Range   Hemoglobin 7.3 (L) 13.0 - 17.0 g/dL   HCT 22.7 (L) 39.0 - 52.0 %    Comment: Performed at [facility], [place] [address] [place], [place] [address]  CBC     Status: Abnormal   Collection Time: 10/09/19  4:43 PM  Result Value Ref Range   WBC 27.3 (H) 4.0 - 10.5 K/uL   RBC 2.62 (L) 4.22 - 5.81 MIL/uL   Hemoglobin 7.3 (L) 13.0 - 17.0 g/dL   HCT 23.0 (L) 39.0 - 52.0 %   MCV 87.8 80.0 - 100.0 fL   MCH 27.9 26.0 - 34.0 pg   MCHC 31.7 30.0 - 36.0 g/dL   RDW 18.6 (H) 11.5 - 15.5 %   Platelets 323 150 - 400 K/uL   nRBC 0.0 0.0 - 0.2 %    Comment: Performed at [facility] [address] [place], [place] [address]  Comprehensive metabolic panel     Status: Abnormal   Collection Time: 10/09/19  4:43 PM  Result Value Ref Range   Sodium 134 (L) 135 - 145 mmol/L   Potassium 6.0 (H) 3.5 - 5.1 mmol/L   Chloride 102 98 - 111 mmol/L   CO2 14 (L) 22 - 32 mmol/L   Glucose, Bld 149 (H) 70 - 99 mg/dL    Comment: Glucose reference range applies only to samples taken after fasting for at least 8 hours.   BUN 26 (H) 6 - 20 mg/dL   Creatinine, Ser 2.38 (H) 0.61 - 1.24 mg/dL  Calcium 8.3 (L) 8.9 - 10.3 mg/dL   Total Protein 4.6 (L) 6.5 - 8.1 g/dL   Albumin 1.9 (L) 3.5 - 5.0 g/dL   AST 449 (H) 15 - 41 U/L   ALT 178 (H) 0 - 44 U/L   Alkaline Phosphatase 91 38 - 126 U/L   Total Bilirubin 2.8 (H) 0.3 - 1.2 mg/dL   GFR calc non Af Amer 30 (L) >60 mL/min   GFR calc Af Amer 35 (L) >60 mL/min   Anion gap 18 (H) 5 - 15    Comment: Performed at [facility] [address] [place], [place]  [address]  Protime-INR     Status: Abnormal   Collection Time: 10/09/19  4:43 PM  Result Value Ref Range   Prothrombin Time 26.9 (H) 11.4 - 15.2 seconds   INR 2.5 (H) 0.8 - 1.2    Comment: (NOTE) INR goal varies based on device and disease states. Performed at [facility], [place] [address] [place], [place] [address]   Glucose, capillary     Status: Abnormal   Collection Time: 10/09/19  5:27 PM  Result Value Ref Range   Glucose-Capillary 127 (H) 70 - 99 mg/dL    Comment: Glucose reference range applies only to samples taken after fasting for at least 8 hours.  Glucose, capillary     Status: Abnormal   Collection Time: 10/09/19 11:56 PM  Result Value Ref Range   Glucose-Capillary 191 (H) 70 - 99 mg/dL    Comment: Glucose reference range applies only to samples taken after fasting for at least 8 hours.  Na and K (sodium & potassium), rand urine     Status: None   Collection Time: 10/10/19 12:22 AM  Result Value Ref Range   Sodium, Ur <10 mmol/L   Potassium Urine 68 mmol/L    Comment: Performed at [facility] [address], [place], [place] [address]  Creatinine, urine, random     Status: None   Collection Time: 10/10/19 12:22 AM  Result Value Ref Range   Creatinine, Urine 113.06 mg/dL    Comment: Performed at [facility] [address] [place], [place] [address]  Osmolality, urine     Status: None   Collection Time: 10/10/19 12:23 AM  Result Value Ref Range   Osmolality, Ur 320 300 - 900 mOsm/kg    Comment: Performed at [facility] [address] [place], [place] [address]  Urinalysis, Routine w reflex microscopic     Status: Abnormal   Collection Time: 10/10/19  1:00 AM  Result Value Ref Range   Color, Urine AMBER (A) YELLOW    Comment: BIOCHEMICALS MAY BE AFFECTED BY COLOR   APPearance CLOUDY (A) CLEAR   Specific Gravity, Urine 1.030 1.005 - 1.030   pH 5.0 5.0 - 8.0   Glucose, UA 50 (A) NEGATIVE mg/dL   Hgb urine dipstick LARGE (A) NEGATIVE    Bilirubin Urine NEGATIVE NEGATIVE   Ketones, ur NEGATIVE NEGATIVE mg/dL   Protein, ur 100 (A) NEGATIVE mg/dL   Nitrite NEGATIVE NEGATIVE   Leukocytes,Ua TRACE (A) NEGATIVE   RBC / HPF >50 (H) 0 - 5 RBC/hpf   Bacteria, UA NONE SEEN NONE SEEN   Squamous Epithelial / LPF 0-5 0 - 5   Mucus PRESENT     Comment: Performed at [facility], [place] [address], [place], [place] [address]  Prepare RBC (crossmatch)     Status: None  Collection Time: 10/10/19  1:42 AM  Result Value Ref Range   Order Confirmation      ORDER PROCESSED BY BLOOD BANK Performed at [facility], [place] [address], [place], [place] [address]   Prepare Pheresed Platelets     Status: None (Preliminary result)   Collection Time: 10/10/19  1:43 AM  Result Value Ref Range   Unit Number [id number]    Blood Component Type PLTP LR1 PAS    Unit division 00    Status of Unit ISSUED    Transfusion Status      OK TO TRANSFUSE Performed at [facility] [address] [place], [place] [address]   Prepare fresh frozen plasma     Status: None (Preliminary result)   Collection Time: 10/10/19  1:43 AM  Result Value Ref Range   Unit Number [id number]    Blood Component Type THAWED PLASMA    Unit division 00    Status of Unit ISSUED    Transfusion Status      OK TO TRANSFUSE Performed at [facility] [address], [place], [place] [address]   Lactic acid, plasma     Status: Abnormal   Collection Time: 10/10/19  2:08 AM  Result Value Ref Range   Lactic Acid, Venous 4.9 (HH) 0.5 - 1.9 mmol/L    Comment: CRITICAL VALUE NOTED.  VALUE IS CONSISTENT WITH PREVIOUSLY REPORTED AND CALLED VALUE. Performed at [facility], [place] [address] [place],  [address]   CBC     Status: Abnormal   Collection Time: 10/10/19  2:08 AM  Result Value Ref Range   WBC 19.0 (H) 4.0 - 10.5 K/uL   RBC 2.71 (L) 4.22 - 5.81 MIL/uL   Hemoglobin 7.8 (L) 13.0 - 17.0 g/dL   HCT 23.6 (L) 39.0 - 52.0 %   MCV 87.1  80.0 - 100.0 fL   MCH 28.8 26.0 - 34.0 pg   MCHC 33.1 30.0 - 36.0 g/dL   RDW 16.9 (H) 11.5 - 15.5 %   Platelets 167 150 - 400 K/uL   nRBC 0.0 0.0 - 0.2 %    Comment: Performed at [facility], [place] [address], [place],  [address]  Comprehensive metabolic panel     Status: Abnormal   Collection Time: 10/10/19  2:08 AM  Result Value Ref Range   Sodium 134 (L) 135 - 145 mmol/L   Potassium 5.9 (H) 3.5 - 5.1 mmol/L   Chloride 106 98 - 111 mmol/L   CO2 17 (L) 22 - 32 mmol/L   Glucose, Bld 195 (H) 70 - 99 mg/dL    Comment: Glucose reference range applies only to samples taken after fasting for at least 8 hours.   BUN 29 (H) 6 - 20 mg/dL   Creatinine, Ser 2.73 (H) 0.61 - 1.24 mg/dL   Calcium 8.1 (L) 8.9 - 10.3 mg/dL   Total Protein 4.4 (L) 6.5 - 8.1 g/dL   Albumin 1.8 (L) 3.5 - 5.0 g/dL   AST 1,855 (H) 15 - 41 U/L   ALT 668 (H) 0 - 44 U/L   Alkaline Phosphatase 109 38 - 126 U/L   Total Bilirubin 2.9 (H) 0.3 - 1.2 mg/dL   GFR calc non Af Amer 26 (L) >60 mL/min   GFR calc Af Amer 30 (L) >60 mL/min   Anion gap 11 5 - 15    Comment: Performed at [facility] [address]  [place], [place] [address]  Protime-INR     Status: Abnormal   Collection Time: 10/10/19  2:08 AM  Result Value Ref Range   Prothrombin Time 27.2 (H) 11.4 - 15.2 seconds   INR 2.5 (H) 0.8 - 1.2    Comment: (NOTE) INR goal varies based on device and disease states. Performed at [facility], [place] [address], [place], [place] [address]   Procalcitonin     Status: None   Collection Time: 10/10/19  2:08 AM  Result Value Ref Range   Procalcitonin 1.27 ng/mL    Comment:        Interpretation: PCT > 0.5 ng/mL and <= 2 ng/mL: Systemic infection (sepsis) is possible, but other conditions are known to elevate PCT as well. (NOTE)       Sepsis PCT Algorithm           Lower Respiratory Tract                                      Infection PCT Algorithm    ----------------------------      ----------------------------         PCT < 0.25 ng/mL                PCT < 0.10 ng/mL         Strongly encourage             Strongly discourage   discontinuation of antibiotics    initiation of antibiotics    ----------------------------     -----------------------------       PCT 0.25 - 0.50 ng/mL            PCT 0.10 - 0.25 ng/mL               OR       >80% decrease in PCT            Discourage initiation of                                            antibiotics      Encourage discontinuation           of antibiotics    ----------------------------     -----------------------------         PCT >= 0.50 ng/mL              PCT 0.26 - 0.50 ng/mL                AND       <80% decrease in PCT             Encourage initiation of                                             antibiotics       Encourage continuation           of antibiotics    ----------------------------     -----------------------------        PCT >= 0.50 ng/mL                  PCT > 0.50 ng/mL  AND         increase in PCT                  Strongly encourage                                      initiation of antibiotics    Strongly encourage escalation           of antibiotics                                     -----------------------------                                           PCT <= 0.25 ng/mL                                                 OR                                        > 80% decrease in PCT                                     Discontinue / Do not initiate                                             antibiotics Performed at [facility], [address] [place], [place] [address]   Glucose, capillary     Status: Abnormal   Collection Time: 10/10/19  7:49 AM  Result Value Ref Range   Glucose-Capillary 160 (H) 70 - 99 mg/dL    Comment: Glucose reference range applies only to samples taken after fasting for at least 8 hours.  Surgical pcr screen     Status: None   Collection Time:  10/10/19  8:16 AM   Specimen: Nasal Mucosa; Nasal Swab  Result Value Ref Range   MRSA, PCR NEGATIVE NEGATIVE   Staphylococcus aureus NEGATIVE NEGATIVE    Comment: (NOTE) The Xpert SA Assay (FDA approved for NASAL specimens in patients 47 years of age and older), is one component of a comprehensive surveillance program. It is not intended to diagnose infection nor to guide or monitor treatment. Performed at [facility], [place] [address], [place], [place] [address]   Lactic acid, plasma     Status: None   Collection Time: 10/10/19  8:30 AM  Result Value Ref Range   Lactic Acid, Venous 1.9 0.5 - 1.9 mmol/L    Comment: Performed at [facility] [address] [place], [place] [address]  CBC     Status: Abnormal   Collection Time: 10/10/19  8:31 AM  Result Value Ref Range   WBC 10.6 (H) 4.0 - 10.5 K/uL   RBC 2.14 (L)  4.22 - 5.81 MIL/uL   Hemoglobin 6.1 (LL) 13.0 - 17.0 g/dL    Comment: REPEATED TO VERIFY THIS CRITICAL RESULT HAS VERIFIED AND BEEN CALLED TO N. NAERLENDER, RN BY JULIE MACEDA DEL ANGEL ON 04 02 2021 AT 81, AND HAS BEEN READ BACK.     HCT 18.5 (L) 39.0 - 52.0 %   MCV 86.4 80.0 - 100.0 fL   MCH 28.5 26.0 - 34.0 pg   MCHC 33.0 30.0 - 36.0 g/dL   RDW 17.2 (H) 11.5 - 15.5 %   Platelets PLATELET CLUMPS NOTED ON SMEAR, UNABLE TO ESTIMATE 150 - 400 K/uL    Comment: Immature Platelet Fraction may be clinically indicated, consider ordering this additional test [id number]    nRBC 0.0 0.0 - 0.2 %    Comment: Performed at [facility], [place] [address] [place], [place] [address]  Comprehensive metabolic panel     Status: Abnormal   Collection Time: 10/10/19  8:31 AM  Result Value Ref Range   Sodium 135 135 - 145 mmol/L   Potassium 5.0 3.5 - 5.1 mmol/L   Chloride 106 98 - 111 mmol/L   CO2 21 (L) 22 - 32 mmol/L   Glucose, Bld 172 (H) 70 - 99 mg/dL    Comment: Glucose reference range applies only to samples taken after fasting for at least 8 hours.   BUN 31  (H) 6 - 20 mg/dL   Creatinine, Ser 2.56 (H) 0.61 - 1.24 mg/dL   Calcium 7.9 (L) 8.9 - 10.3 mg/dL   Total Protein 4.3 (L) 6.5 - 8.1 g/dL   Albumin 1.9 (L) 3.5 - 5.0 g/dL   AST 1,944 (H) 15 - 41 U/L   ALT 684 (H) 0 - 44 U/L   Alkaline Phosphatase 105 38 - 126 U/L   Total Bilirubin 2.5 (H) 0.3 - 1.2 mg/dL   GFR calc non Af Amer 28 (L) >60 mL/min   GFR calc Af Amer 32 (L) >60 mL/min   Anion gap 8 5 - 15    Comment: Performed at [facility] [address], [place], [place] [address]  Protime-INR     Status: Abnormal   Collection Time: 10/10/19  8:31 AM  Result Value Ref Range   Prothrombin Time 25.3 (H) 11.4 - 15.2 seconds   INR 2.3 (H) 0.8 - 1.2    Comment: (NOTE) INR goal varies based on device and disease states. Performed at [facility], [place] [address] [place], [place] [address]   Cortisol     Status: None   Collection Time: 10/10/19  8:32 AM  Result Value Ref Range   Cortisol, Plasma 20.5 ug/dL    Comment: (NOTE) AM    6.7 - 22.6 ug/dL PM   <10.0       ug/dL Performed at [facility] [address], [place], [place] [address]   Prepare RBC (crossmatch)     Status: None   Collection Time: 10/10/19 10:28 AM  Result Value Ref Range   Order Confirmation      ORDER PROCESSED BY BLOOD BANK Performed at [facility], [place] [address], [place],  [address]   Prepare cryoprecipitate     Status: None (Preliminary result)   Collection Time: 10/10/19 10:28 AM  Result Value Ref Range   Unit Number [id number]    Blood Component Type CRYPOOL THAW    Unit division 00    Status of Unit ISSUED  Transfusion Status      OK TO TRANSFUSE Performed at [facility], [place] [address], [place], [place] [address]   Glucose, capillary     Status: Abnormal   Collection Time: 10/10/19 11:43 AM  Result Value Ref Range   Glucose-Capillary 181 (H) 70 - 99 mg/dL    Comment: Glucose reference range applies only to samples taken after fasting for at least  8 hours.     ROS:  General weak and fatigued Eyes no visual loss visual complaints Ears nose mouth throat no hearing loss epistaxis sore throat Cardiovascular no anginal chest pain orthopnea PND palpitation  Respiratory no cough wheeze hemoptysis Abdominal system history of cirrhosis splenomegaly decompensated increasing abdominal distention status post paracentesis 09/13/2019 Endocrine history of diabetes Dermatologic no skin rashes, left hip abscess Neurologic no stroke no seizures  Physical Exam: Vitals:   10/10/19 1152 10/10/19 1200  BP:  (!) 103/48  Pulse:  (!) 105  Resp:  19  Temp: 98.6 F (37 C) 98.6 F (37 C)  SpO2:  97%     General: Alert and oriented nondistressed HEENT: Normal external appearance Eyes: Extraocular movements intact normal anicteric Neck: Supple JVP not elevated no thyromegaly Heart: Regular rate and rhythm no murmurs rubs or gallops lungs: Abdomen: Soft nontender nondistended Extremities: Left leg with lateral thigh wound VAC Skin: No rashes lesions Neuro: Non focal cranial nerves intact  Assessment/Plan: 1.Acute kidney injury there is very marginal urine output.  Urine sediment does appear to have some red blood cells and proteinuria although the timing of this acute kidney injury appears to be in the setting of surgery.  There was a CT scan done of the abdomen and pelvis 09/12/2019 with contrast.  There is also a paracentesis that was performed 09/13/2019.  Neither of these events seem to have precipitated the acute kidney injury.  My working diagnosis that this represents some ischemic acute tubular necrosis and should improve.  We will check a renal ultrasound to make sure there is no evidence of any obstruction.  I would continue to avoid nephrotoxins ACE inhibitor's ARB's and vancomycin.  I would also continue to avoid use of IV contrast.  It appears that there is appropriate dosing of medications.  Even though the sodium is less than 10 I am not sure  that this represents hepatorenal syndrome and therefore not sure that we need to initiate treatment with octreotide, midodrine and albumin infusions.  I would like to see how his renal function does over the next 24 hours.  My sense is that there will be some improvement. 2. Hypertension/volume  -appears to be relatively stable at this point did receive some sodium chloride boluses.  May be with a Lasix challenge in a.m. if there is still little urine output.  Would recommend a Lasix dose of 80 mg IV x1 3.  Diabetes mellitus per primary team 4.  Hepatic cirrhosis continue supportive therapy. 5.  Poor wound healing and recurrent abscess appreciate assistance from infectious disease.   JAJUAN SKOOG 10/10/2019, 12:12 PM

## 2019-10-11 ENCOUNTER — Encounter (HOSPITAL_COMMUNITY): Payer: Self-pay | Admitting: Orthopedic Surgery

## 2019-10-11 ENCOUNTER — Inpatient Hospital Stay (HOSPITAL_COMMUNITY): Payer: Self-pay | Admitting: Certified Registered Nurse Anesthetist

## 2019-10-11 ENCOUNTER — Encounter (HOSPITAL_COMMUNITY)
Admission: RE | Disposition: A | Discharge status: Home / Self Care | Payer: Self-pay | Source: Home / Self Care | Attending: Internal Medicine

## 2019-10-11 DIAGNOSIS — L0231 Cutaneous abscess of buttock: Secondary | ICD-10-CM

## 2019-10-11 DIAGNOSIS — K7460 Unspecified cirrhosis of liver: Secondary | ICD-10-CM

## 2019-10-11 DIAGNOSIS — T8130XD Disruption of wound, unspecified, subsequent encounter: Secondary | ICD-10-CM

## 2019-10-11 DIAGNOSIS — R188 Other ascites: Secondary | ICD-10-CM

## 2019-10-11 DIAGNOSIS — Z978 Presence of other specified devices: Secondary | ICD-10-CM

## 2019-10-11 DIAGNOSIS — B9689 Other specified bacterial agents as the cause of diseases classified elsewhere: Secondary | ICD-10-CM

## 2019-10-11 DIAGNOSIS — Z95828 Presence of other vascular implants and grafts: Secondary | ICD-10-CM

## 2019-10-11 HISTORY — PX: INCISION AND DRAINAGE HIP: SHX1801

## 2019-10-11 LAB — BPAM PLATELET PHERESIS
Blood Product Expiration Date: 202104032359
ISSUE DATE / TIME: 202104020310
Unit Type and Rh: 5100

## 2019-10-11 LAB — RETICULOCYTES
Immature Retic Fract: 25.5 % — ABNORMAL HIGH (ref 2.3–15.9)
RBC.: 2.52 MIL/uL — ABNORMAL LOW (ref 4.22–5.81)
Retic Count, Absolute: 123 10*3/uL (ref 19.0–186.0)
Retic Ct Pct: 4.9 % — ABNORMAL HIGH (ref 0.4–3.1)

## 2019-10-11 LAB — COMPREHENSIVE METABOLIC PANEL
ALT: 551 U/L — ABNORMAL HIGH (ref 0–44)
AST: 995 U/L — ABNORMAL HIGH (ref 15–41)
Albumin: 2.2 g/dL — ABNORMAL LOW (ref 3.5–5.0)
Alkaline Phosphatase: 117 U/L (ref 38–126)
Anion gap: 8 (ref 5–15)
BUN: 32 mg/dL — ABNORMAL HIGH (ref 6–20)
CO2: 23 mmol/L (ref 22–32)
Calcium: 8 mg/dL — ABNORMAL LOW (ref 8.9–10.3)
Chloride: 103 mmol/L (ref 98–111)
Creatinine, Ser: 1.69 mg/dL — ABNORMAL HIGH (ref 0.61–1.24)
GFR calc Af Amer: 53 mL/min — ABNORMAL LOW (ref >60)
GFR calc non Af Amer: 46 mL/min — ABNORMAL LOW (ref >60)
Glucose, Bld: 263 mg/dL — ABNORMAL HIGH (ref 70–99)
Potassium: 3.9 mmol/L (ref 3.5–5.1)
Sodium: 134 mmol/L — ABNORMAL LOW (ref 135–145)
Total Bilirubin: 2.2 mg/dL — ABNORMAL HIGH (ref 0.3–1.2)
Total Protein: 4.8 g/dL — ABNORMAL LOW (ref 6.5–8.1)

## 2019-10-11 LAB — PREPARE FRESH FROZEN PLASMA: Unit division: 0

## 2019-10-11 LAB — PROTIME-INR
INR: 2.1 — ABNORMAL HIGH (ref 0.8–1.2)
Prothrombin Time: 23.9 seconds — ABNORMAL HIGH (ref 11.4–15.2)

## 2019-10-11 LAB — PREPARE CRYOPRECIPITATE: Unit division: 0

## 2019-10-11 LAB — GLUCOSE, CAPILLARY
Glucose-Capillary: 186 mg/dL — ABNORMAL HIGH (ref 70–99)
Glucose-Capillary: 182 mg/dL — ABNORMAL HIGH (ref 70–99)
Glucose-Capillary: 234 mg/dL — ABNORMAL HIGH (ref 70–99)
Glucose-Capillary: 174 mg/dL — ABNORMAL HIGH (ref 70–99)

## 2019-10-11 LAB — CBC
HCT: 22.1 % — ABNORMAL LOW (ref 39.0–52.0)
Hemoglobin: 7.3 g/dL — ABNORMAL LOW (ref 13.0–17.0)
MCH: 29 pg (ref 26.0–34.0)
MCHC: 33 g/dL (ref 30.0–36.0)
MCV: 87.7 fL (ref 80.0–100.0)
Platelets: 73 10*3/uL — ABNORMAL LOW (ref 150–400)
RBC: 2.52 MIL/uL — ABNORMAL LOW (ref 4.22–5.81)
RDW: 17.3 % — ABNORMAL HIGH (ref 11.5–15.5)
WBC: 5.8 10*3/uL (ref 4.0–10.5)
nRBC: 0 % (ref 0.0–0.2)

## 2019-10-11 LAB — PHOSPHORUS: Phosphorus: 2.8 mg/dL (ref 2.5–4.6)

## 2019-10-11 LAB — BPAM FFP
Blood Product Expiration Date: 202104062359
ISSUE DATE / TIME: 202104020310
Unit Type and Rh: 6200

## 2019-10-11 LAB — IRON AND TIBC
Iron: 9 ug/dL — ABNORMAL LOW (ref 45–182)
Saturation Ratios: 4 % — ABNORMAL LOW (ref 17.9–39.5)
TIBC: 217 ug/dL — ABNORMAL LOW (ref 250–450)
UIBC: 208 ug/dL

## 2019-10-11 LAB — VITAMIN B12: Vitamin B-12: 1775 pg/mL — ABNORMAL HIGH (ref 180–914)

## 2019-10-11 LAB — BPAM CRYOPRECIPITATE
Blood Product Expiration Date: 202104021650
ISSUE DATE / TIME: 202104021119
Unit Type and Rh: 6200

## 2019-10-11 LAB — PREPARE PLATELET PHERESIS: Unit division: 0

## 2019-10-11 LAB — PROCALCITONIN: Procalcitonin: 0.55 ng/mL

## 2019-10-11 LAB — FOLATE: Folate: 7.2 ng/mL (ref >5.9)

## 2019-10-11 LAB — FERRITIN: Ferritin: 113 ng/mL (ref 24–336)

## 2019-10-11 SURGERY — IRRIGATION AND DEBRIDEMENT HIP
Anesthesia: General | Site: Hip | Laterality: Left

## 2019-10-11 MED ORDER — POLYETHYLENE GLYCOL 3350 17 G PO PACK: 17.0000 g | PACK | Freq: Every day | ORAL | Status: DC | PRN

## 2019-10-11 MED ORDER — HYDROMORPHONE HCL 1 MG/ML IJ SOLN
0.5000 mg | INTRAMUSCULAR | Status: DC | PRN
  Administered 2019-10-11: 18:00:00 0.5 mg via INTRAVENOUS
  Administered 2019-10-12 – 2019-10-13 (×3): 1 mg via INTRAVENOUS
  Filled 2019-10-11 (×5): qty 1

## 2019-10-11 MED ORDER — MIDAZOLAM HCL 2 MG/2ML IJ SOLN
INTRAMUSCULAR | Status: AC
  Filled 2019-10-11: qty 2

## 2019-10-11 MED ORDER — METOCLOPRAMIDE HCL 10 MG PO TABS: 5.0000 mg | ORAL_TABLET | Freq: Three times a day (TID) | ORAL | Status: DC | PRN

## 2019-10-11 MED ORDER — METHOCARBAMOL 1000 MG/10ML IJ SOLN
500.0000 mg | Freq: Four times a day (QID) | INTRAVENOUS | Status: DC | PRN
  Filled 2019-10-11: qty 5

## 2019-10-11 MED ORDER — DEXAMETHASONE SODIUM PHOSPHATE 10 MG/ML IJ SOLN
INTRAMUSCULAR | Status: AC
  Filled 2019-10-11: qty 1

## 2019-10-11 MED ORDER — SUCCINYLCHOLINE CHLORIDE 200 MG/10ML IV SOSY
PREFILLED_SYRINGE | INTRAVENOUS | Status: AC
  Filled 2019-10-11: qty 10

## 2019-10-11 MED ORDER — LIDOCAINE 2% (20 MG/ML) 5 ML SYRINGE
INTRAMUSCULAR | Status: DC | PRN
  Administered 2019-10-11: 20 mg via INTRAVENOUS

## 2019-10-11 MED ORDER — TRANEXAMIC ACID-NACL 1000-0.7 MG/100ML-% IV SOLN
INTRAVENOUS | Status: AC | PRN
  Administered 2019-10-11: 2000 mg via INTRAVENOUS

## 2019-10-11 MED ORDER — MAGNESIUM CITRATE PO SOLN: 1.0000 | Freq: Once | ORAL | Status: DC | PRN

## 2019-10-11 MED ORDER — ROCURONIUM BROMIDE 10 MG/ML (PF) SYRINGE
PREFILLED_SYRINGE | INTRAVENOUS | Status: AC
  Filled 2019-10-11: qty 10

## 2019-10-11 MED ORDER — FENTANYL CITRATE (PF) 100 MCG/2ML IJ SOLN: 25.0000 ug | INTRAMUSCULAR | Status: DC | PRN

## 2019-10-11 MED ORDER — POVIDONE-IODINE 10 % EX SWAB: 2.0000 "application " | Freq: Once | CUTANEOUS | Status: DC

## 2019-10-11 MED ORDER — FENTANYL CITRATE (PF) 100 MCG/2ML IJ SOLN
INTRAMUSCULAR | Status: DC | PRN
  Administered 2019-10-11: 50 ug via INTRAVENOUS

## 2019-10-11 MED ORDER — MENTHOL 3 MG MT LOZG
1.0000 | LOZENGE | OROMUCOSAL | Status: DC | PRN
  Filled 2019-10-11: qty 9

## 2019-10-11 MED ORDER — METHOCARBAMOL 500 MG PO TABS
500.0000 mg | ORAL_TABLET | Freq: Four times a day (QID) | ORAL | Status: DC | PRN
  Administered 2019-10-13: 500 mg via ORAL
  Filled 2019-10-11: qty 1

## 2019-10-11 MED ORDER — PROPOFOL 10 MG/ML IV BOLUS
INTRAVENOUS | Status: DC | PRN
  Administered 2019-10-11: 160 mg via INTRAVENOUS

## 2019-10-11 MED ORDER — ALBUMIN HUMAN 25 % IV SOLN
12.5000 g | Freq: Four times a day (QID) | INTRAVENOUS | Status: AC
  Administered 2019-10-11 (×3): 12.5 g via INTRAVENOUS
  Filled 2019-10-11 (×3): qty 50

## 2019-10-11 MED ORDER — TRANEXAMIC ACID-NACL 1000-0.7 MG/100ML-% IV SOLN
INTRAVENOUS | Status: AC
  Filled 2019-10-11: qty 100

## 2019-10-11 MED ORDER — TRANEXAMIC ACID-NACL 1000-0.7 MG/100ML-% IV SOLN
INTRAVENOUS | Status: DC | PRN
  Administered 2019-10-11: 1000 mg via INTRAVENOUS

## 2019-10-11 MED ORDER — ONDANSETRON HCL 4 MG/2ML IJ SOLN
INTRAMUSCULAR | Status: DC | PRN
  Administered 2019-10-11: 4 mg via INTRAVENOUS

## 2019-10-11 MED ORDER — ONDANSETRON HCL 4 MG/2ML IJ SOLN
INTRAMUSCULAR | Status: AC
  Filled 2019-10-11: qty 2

## 2019-10-11 MED ORDER — SODIUM CHLORIDE 0.9 % IR SOLN
Status: DC | PRN
  Administered 2019-10-11: 3000 mL

## 2019-10-11 MED ORDER — TRANEXAMIC ACID 1000 MG/10ML IV SOLN
2000.0000 mg | Freq: Once | INTRAVENOUS | Status: DC
  Filled 2019-10-11: qty 20

## 2019-10-11 MED ORDER — OXYCODONE HCL 5 MG PO TABS: 10.0000 mg | ORAL_TABLET | ORAL | Status: DC | PRN

## 2019-10-11 MED ORDER — OXYCODONE HCL 5 MG PO TABS: 5.0000 mg | ORAL_TABLET | ORAL | Status: DC | PRN

## 2019-10-11 MED ORDER — FENTANYL CITRATE (PF) 250 MCG/5ML IJ SOLN
INTRAMUSCULAR | Status: AC
  Filled 2019-10-11: qty 5

## 2019-10-11 MED ORDER — METOCLOPRAMIDE HCL 5 MG/ML IJ SOLN: 5.0000 mg | Freq: Three times a day (TID) | INTRAMUSCULAR | Status: DC | PRN

## 2019-10-11 MED ORDER — SODIUM CHLORIDE 0.9 % IV SOLN: INTRAVENOUS | Status: DC

## 2019-10-11 MED ORDER — LIDOCAINE 2% (20 MG/ML) 5 ML SYRINGE
INTRAMUSCULAR | Status: AC
  Filled 2019-10-11: qty 5

## 2019-10-11 MED ORDER — PROPOFOL 10 MG/ML IV BOLUS
INTRAVENOUS | Status: AC
  Filled 2019-10-11: qty 20

## 2019-10-11 MED ORDER — PHENYLEPHRINE 40 MCG/ML (10ML) SYRINGE FOR IV PUSH (FOR BLOOD PRESSURE SUPPORT)
PREFILLED_SYRINGE | INTRAVENOUS | Status: AC
  Filled 2019-10-11: qty 30

## 2019-10-11 MED ORDER — ONDANSETRON HCL 4 MG PO TABS: 4.0000 mg | ORAL_TABLET | Freq: Four times a day (QID) | ORAL | Status: DC | PRN

## 2019-10-11 MED ORDER — ACETAMINOPHEN 325 MG PO TABS: 325.0000 mg | ORAL_TABLET | Freq: Four times a day (QID) | ORAL | Status: DC | PRN

## 2019-10-11 MED ORDER — SODIUM CHLORIDE 0.9 % IV SOLN
2.0000 g | Freq: Three times a day (TID) | INTRAVENOUS | Status: DC
  Administered 2019-10-11 – 2019-10-13 (×6): 2 g via INTRAVENOUS
  Filled 2019-10-11 (×8): qty 2

## 2019-10-11 MED ORDER — DEXTROSE 5 % IV SOLN
3.0000 g | INTRAVENOUS | Status: AC
  Administered 2019-10-11: 3 g via INTRAVENOUS
  Filled 2019-10-11: qty 3000

## 2019-10-11 MED ORDER — BISACODYL 10 MG RE SUPP: 10.0000 mg | Freq: Every day | RECTAL | Status: DC | PRN

## 2019-10-11 MED ORDER — ENSURE PRE-SURGERY PO LIQD
296.0000 mL | Freq: Once | ORAL | Status: AC
  Administered 2019-10-11: 03:00:00 296 mL via ORAL
  Filled 2019-10-11: qty 296

## 2019-10-11 MED ORDER — ONDANSETRON HCL 4 MG/2ML IJ SOLN: 4.0000 mg | Freq: Once | INTRAMUSCULAR | Status: DC | PRN

## 2019-10-11 MED ORDER — CHLORHEXIDINE GLUCONATE 4 % EX LIQD
60.0000 mL | Freq: Once | CUTANEOUS | Status: AC
  Administered 2019-10-11: 4 via TOPICAL
  Filled 2019-10-11: qty 60

## 2019-10-11 MED ORDER — 0.9 % SODIUM CHLORIDE (POUR BTL) OPTIME
TOPICAL | Status: DC | PRN
  Administered 2019-10-11: 08:00:00 1000 mL

## 2019-10-11 MED ORDER — DOCUSATE SODIUM 100 MG PO CAPS
100.0000 mg | ORAL_CAPSULE | Freq: Two times a day (BID) | ORAL | Status: DC
  Filled 2019-10-11 (×2): qty 1

## 2019-10-11 MED ORDER — ONDANSETRON HCL 4 MG/2ML IJ SOLN: 4.0000 mg | Freq: Four times a day (QID) | INTRAMUSCULAR | Status: DC | PRN

## 2019-10-11 MED ORDER — PHENOL 1.4 % MT LIQD
1.0000 | OROMUCOSAL | Status: DC | PRN
  Filled 2019-10-11: qty 177

## 2019-10-11 SURGICAL SUPPLY — 34 items
BAG DECANTER FOR FLEXI CONT (MISCELLANEOUS) ×6 IMPLANT
BENZOIN TINCTURE PRP APPL 2/3 (GAUZE/BANDAGES/DRESSINGS) ×6 IMPLANT
CANISTER WOUND CARE 500ML ATS (WOUND CARE) ×3 IMPLANT
COVER SURGICAL LIGHT HANDLE (MISCELLANEOUS) ×3 IMPLANT
COVER WAND RF STERILE (DRAPES) IMPLANT
DRAPE IMP U-DRAPE 54X76 (DRAPES) IMPLANT
DRAPE INCISE IOBAN 66X45 STRL (DRAPES) ×3 IMPLANT
DRAPE ORTHO SPLIT 77X108 STRL (DRAPES) ×4
DRAPE SURG ORHT 6 SPLT 77X108 (DRAPES) ×2 IMPLANT
DRAPE U-SHAPE 47X51 STRL (DRAPES) ×6 IMPLANT
DRESSING PREVENA PLUS CUSTOM (GAUZE/BANDAGES/DRESSINGS) ×1 IMPLANT
DRSG PREVENA PLUS CUSTOM (GAUZE/BANDAGES/DRESSINGS) ×3
DURAPREP 26ML APPLICATOR (WOUND CARE) ×3 IMPLANT
ELECT CAUTERY BLADE 6.4 (BLADE) ×3 IMPLANT
ELECT REM PT RETURN 9FT ADLT (ELECTROSURGICAL) ×3
ELECTRODE REM PT RTRN 9FT ADLT (ELECTROSURGICAL) ×1 IMPLANT
GLOVE BIOGEL PI IND STRL 9 (GLOVE) ×1 IMPLANT
GLOVE BIOGEL PI INDICATOR 9 (GLOVE) ×2
GLOVE SURG ORTHO 9.0 STRL STRW (GLOVE) ×3 IMPLANT
GOWN STRL REUS W/ TWL XL LVL3 (GOWN DISPOSABLE) ×2 IMPLANT
GOWN STRL REUS W/TWL XL LVL3 (GOWN DISPOSABLE) ×4
HANDPIECE INTERPULSE COAX TIP (DISPOSABLE) ×2
KIT BASIN OR (CUSTOM PROCEDURE TRAY) ×3 IMPLANT
KIT TURNOVER KIT B (KITS) ×3 IMPLANT
MANIFOLD NEPTUNE II (INSTRUMENTS) ×3 IMPLANT
NS IRRIG 1000ML POUR BTL (IV SOLUTION) ×3 IMPLANT
PACK TOTAL JOINT (CUSTOM PROCEDURE TRAY) ×3 IMPLANT
PACK UNIVERSAL I (CUSTOM PROCEDURE TRAY) IMPLANT
PAD ARMBOARD 7.5X6 YLW CONV (MISCELLANEOUS) ×6 IMPLANT
SET HNDPC FAN SPRY TIP SCT (DISPOSABLE) ×1 IMPLANT
SUT ETHILON 2 0 PSLX (SUTURE) ×12 IMPLANT
TOWEL GREEN STERILE FF (TOWEL DISPOSABLE) ×3 IMPLANT
UNDERPAD 30X30 (UNDERPADS AND DIAPERS) ×9 IMPLANT
WATER STERILE IRR 1000ML POUR (IV SOLUTION) IMPLANT

## 2019-10-11 NOTE — Anesthesia Procedure Notes (Signed)
Procedure Name: LMA Insertion Date/Time: 10/11/2019 7:58 AM Performed by: Lowella Dell, CRNA Pre-anesthesia Checklist: Patient identified, Emergency Drugs available, Suction available and Patient being monitored Patient Re-evaluated:Patient Re-evaluated prior to induction Oxygen Delivery Method: Circle System Utilized Preoxygenation: Pre-oxygenation with 100% oxygen Induction Type: IV induction LMA: LMA inserted LMA Size: 5.0 Number of attempts: 1 Airway Equipment and Method: Bite block Placement Confirmation: positive ETCO2 Tube secured with: Tape Dental Injury: Teeth and Oropharynx as per pre-operative assessment

## 2019-10-11 NOTE — Progress Notes (Signed)
RN accompanied patient and transport team down to pre-op area. RN gave bedside handoff report to [place], Therapist, sports. Upon returning to the unit, RN returned patient's personal blanket to his room in a patient belonging bag.

## 2019-10-11 NOTE — Transfer of Care (Signed)
Immediate Anesthesia Transfer of Care Note  Patient: Bryant Saye  Procedure(s) Performed: IRRIGATION AND DEBRIDEMENT HIP (Left Hip)  Patient Location: PACU  Anesthesia Type:General  Level of Consciousness: awake and patient cooperative  Airway & Oxygen Therapy: Patient Spontanous Breathing and Patient connected to face mask oxygen  Post-op Assessment: Report given to RN and Post -op Vital signs reviewed and stable  Post vital signs: Reviewed and stable  Last Vitals:  Vitals Value Taken Time  BP 127/67 10/11/19 0900  Temp    Pulse 95 10/11/19 0902  Resp 15 10/11/19 0902  SpO2 95 % 10/11/19 0902  Vitals shown include unvalidated device data.  Last Pain:  Vitals:   10/11/19 0547  TempSrc: Oral  PainSc:       Patients Stated Pain Goal: 2 (73/73/66 8159)  Complications: No apparent anesthesia complications

## 2019-10-11 NOTE — Progress Notes (Signed)
[facility] ROUNDING NOTE   Subjective:   Robert Sullivan is a 54 y.o. male.  With a history of diabetes nonalcoholic steatohepatitis or NASH cirrhosis recent hip infection with recent MSSA bacteremia.  Presented for hip I&D with abscess of left hip and possible sepsis due to wound infection.  He underwent debridement 10/08/2019  and is scheduled for debridement 10/11/2019. Baseline creatinine less than 1 mg/dL 10/06/2019 increased to 2.38 mg/dL 10/09/2019 and 2.56 10/10/2019.  Urine output 750 cc 10/10/2019.  Appears to have decompensated liver cirrhosis with a meld score of 32.  Albumin 1.8 INR 2.5 increasing LFTs  Urinalysis shows urine sodium < 10 10/10/2019 urine osmolality 320 urine sediment greater than 50 red blood cells per high-powered film 100 mg/dL protein  Blood pressure 131/68 pulse 89 temperature 98.8 O2 sats 97% room air  Sodium 134 potassium 3.9 chloride 103 CO2 23 BUN is 32 creatinine 1.69 glucose 263 calcium 8.0 albumin 2.2 AST 995 ALT 551 total bilirubin 2.2 hemoglobin 7.3  Iron sulfate 325 mg daily insulin sliding scale Lokelma 10 g x 1 although dose appears to been held at 6.42 AM  Renal ultrasound 12.8 cm kidney right 13.3 cm kidney left no masses or hydronephrosis   Objective:  Vital signs in last 24 hours:  Temp:  [97.8 F (36.6 C)-99.1 F (37.3 C)] 98.8 F (37.1 C) (04/03 0547) Pulse Rate:  [89-108] 89 (04/03 0547) Resp:  [12-28] 15 (04/03 0547) BP: (101-136)/(45-68) 131/68 (04/03 0547) SpO2:  [95 %-100 %] 99 % (04/03 0547)  Weight change:  Filed Weights   10/08/19 1140  Weight: (!) 148.3 kg    Intake/Output: I/O last 3 completed shifts: In: 3989.7 [P.O.:240; I.V.:452.7; Blood:3197; IV Piggyback:100] Out: 7342 [Urine:1025; Drains:310]   Intake/Output this shift:  Total I/O In: 50 [IV Piggyback:50] Out: 450 [Drains:350; Blood:100]  General: Alert and oriented nondistressed HEENT: Normal external appearance Eyes: Extraocular movements intact  normal anicteric Neck: Supple JVP not elevated no thyromegaly Heart: Regular rate and rhythm no murmurs rubs or gallops lungs: Abdomen: Soft nontender nondistended Extremities: Left leg with lateral thigh wound VAC Skin: No rashes lesions Neuro: Non focal cranial nerves intact   Basic Metabolic Panel: Recent Labs  Lab 10/06/19 1015 10/06/19 1015 10/09/19 1643 10/09/19 1643 10/10/19 0208 10/10/19 0831 10/11/19 0414  NA 136  --  134*  --  134* 135 134*  K 3.5  --  6.0*  --  5.9* 5.0 3.9  CL 104  --  102  --  106 106 103  CO2 25  --  14*  --  17* 21* 23  GLUCOSE 113*  --  149*  --  195* 172* 263*  BUN 15  --  26*  --  29* 31* 32*  CREATININE 0.70  --  2.38*  --  2.73* 2.56* 1.69*  CALCIUM 7.9*   < > 8.3*   < > 8.1* 7.9* 8.0*  PHOS  --   --   --   --   --   --  2.8   < > = values in this interval not displayed.    Liver Function Tests: Recent Labs  Lab 10/09/19 1643 10/10/19 0208 10/10/19 0831 10/11/19 0414  AST 449* 1,855* 1,944* 995*  ALT 178* 668* 684* 551*  ALKPHOS 91 109 105 117  BILITOT 2.8* 2.9* 2.5* 2.2*  PROT 4.6* 4.4* 4.3* 4.8*  ALBUMIN 1.9* 1.8* 1.9* 2.2*   No results for input(s): LIPASE, AMYLASE in the last 168 hours. No  results for input(s): AMMONIA in the last 168 hours.  CBC: Recent Labs  Lab 10/06/19 1015 10/08/19 1816 10/09/19 0101 10/09/19 1129 10/09/19 1643 10/10/19 0208 10/10/19 0831 10/10/19 2135 10/11/19 0414  WBC 4.9  --  17.8*  --  27.3* 19.0* 10.6*  --  5.8  NEUTROABS 3.3  --   --   --   --   --   --   --   --   HGB 8.4*   < > 7.8*   < > 7.3* 7.8* 6.1* 7.4* 7.3*  HCT 27.2*   < > 24.6*   < > 23.0* 23.6* 18.5* 22.3* 22.1*  MCV 85.5  --  84.5  --  87.8 87.1 86.4  --  87.7  PLT 104*  --  238  --  323 167 PLATELET CLUMPS NOTED ON SMEAR, UNABLE TO ESTIMATE  --  73*   < > = values in this interval not displayed.    Cardiac Enzymes: No results for input(s): CKTOTAL, CKMB, CKMBINDEX, TROPONINI in the last 168 hours.  BNP: Invalid  input(s): POCBNP  CBG: Recent Labs  Lab 10/10/19 0749 10/10/19 1143 10/10/19 1554 10/10/19 2118 10/11/19 0600  GLUCAP 160* 181* 200* 194* 186*    Microbiology: Results for orders placed or performed during the hospital encounter of 10/08/19  Respiratory Panel by RT PCR (Flu A&B, Covid) - Nasopharyngeal Swab     Status: None   Collection Time: 10/08/19 11:41 AM   Specimen: Nasopharyngeal Swab  Result Value Ref Range Status   SARS Coronavirus 2 by RT PCR NEGATIVE NEGATIVE Final    Comment: (NOTE) SARS-CoV-2 target nucleic acids are NOT DETECTED. The SARS-CoV-2 RNA is generally detectable in upper respiratoy specimens during the acute phase of infection. The lowest concentration of SARS-CoV-2 viral copies this assay can detect is 131 copies/mL. A negative result does not preclude SARS-Cov-2 infection and should not be used as the sole basis for treatment or other patient management decisions. A negative result may occur with  improper specimen collection/handling, submission of specimen other than nasopharyngeal swab, presence of viral mutation(s) within the areas targeted by this assay, and inadequate number of viral copies (<131 copies/mL). A negative result must be combined with clinical observations, patient history, and epidemiological information. The expected result is Negative. Fact Sheet for Patients:  PinkCheek.be Fact Sheet for Healthcare Providers:  GravelBags.it This test is not yet ap proved or cleared by the [place] FDA and  has been authorized for detection and/or diagnosis of SARS-CoV-2 by FDA under an Emergency Use Authorization (EUA). This EUA will remain  in effect (meaning this test can be used) for the duration of the COVID-19 declaration under Section 564(b)(1) of the Act, 21 U.S.C. section 360bbb-3(b)(1), unless the authorization is terminated or revoked sooner.    Influenza A by PCR  NEGATIVE NEGATIVE Final   Influenza B by PCR NEGATIVE NEGATIVE Final    Comment: (NOTE) The Xpert Xpress SARS-CoV-2/FLU/RSV assay is intended as an aid in  the diagnosis of influenza from Nasopharyngeal swab specimens and  should not be used as a sole basis for treatment. Nasal washings and  aspirates are unacceptable for Xpert Xpress SARS-CoV-2/FLU/RSV  testing. Fact Sheet for Patients: PinkCheek.be Fact Sheet for Healthcare Providers: GravelBags.it This test is not yet approved or cleared by the [place] FDA and  has been authorized for detection and/or diagnosis of SARS-CoV-2 by  FDA under an Emergency Use Authorization (EUA). This EUA will remain  in effect (meaning  this test can be used) for the duration of the  Covid-19 declaration under Section 564(b)(1) of the Act, 21  U.S.C. section 360bbb-3(b)(1), unless the authorization is  terminated or revoked. Performed at [facility], [place] [address], [place], [place] [address]   Aerobic/Anaerobic Culture (surgical/deep wound)     Status: None (Preliminary result)   Collection Time: 10/08/19  1:20 PM   Specimen: Wound  Result Value Ref Range Status   Specimen Description WOUND  Final   Special Requests LEFT HIP  Final   Gram Stain   Final    ABUNDANT WBC PRESENT,BOTH PMN AND MONONUCLEAR NO ORGANISMS SEEN Performed at [facility], [address], [place], [place] [address]    Culture   Final    MODERATE ENTEROBACTER SPECIES NO ANAEROBES ISOLATED; CULTURE IN PROGRESS FOR 5 DAYS    Report Status PENDING  Incomplete   Organism ID, Bacteria ENTEROBACTER SPECIES  Final      Susceptibility   Enterobacter species - MIC*    CEFAZOLIN >=64 RESISTANT Resistant     CEFEPIME <=0.12 SENSITIVE Sensitive     CEFTAZIDIME <=1 SENSITIVE Sensitive     CIPROFLOXACIN <=0.25 SENSITIVE Sensitive     GENTAMICIN <=1 SENSITIVE Sensitive     IMIPENEM <=0.25 SENSITIVE  Sensitive     TRIMETH/SULFA <=20 SENSITIVE Sensitive     PIP/TAZO <=4 SENSITIVE Sensitive     * MODERATE ENTEROBACTER SPECIES  Culture, blood (routine x 2)     Status: None (Preliminary result)   Collection Time: 10/10/19  1:50 AM   Specimen: BLOOD LEFT HAND  Result Value Ref Range Status   Specimen Description BLOOD LEFT HAND  Final   Special Requests   Final    BOTTLES DRAWN AEROBIC AND ANAEROBIC Blood Culture results may not be optimal due to an excessive volume of blood received in culture bottles   Culture   Final    NO GROWTH < 12 HOURS Performed at [facility], [place] [address], [place], [place] [address]    Report Status PENDING  Incomplete  Culture, blood (routine x 2)     Status: None (Preliminary result)   Collection Time: 10/10/19  2:46 AM   Specimen: BLOOD  Result Value Ref Range Status   Specimen Description BLOOD LEFT WRIST  Final   Special Requests   Final    BOTTLES DRAWN AEROBIC AND ANAEROBIC Blood Culture adequate volume   Culture   Final    NO GROWTH < 12 HOURS Performed at [facility], [place] [address] [place], [place] [address]    Report Status PENDING  Incomplete  Surgical pcr screen     Status: None   Collection Time: 10/10/19  8:16 AM   Specimen: Nasal Mucosa; Nasal Swab  Result Value Ref Range Status   MRSA, PCR NEGATIVE NEGATIVE Final   Staphylococcus aureus NEGATIVE NEGATIVE Final    Comment: (NOTE) The Xpert SA Assay (FDA approved for NASAL specimens in patients 78 years of age and older), is one component of a comprehensive surveillance program. It is not intended to diagnose infection nor to guide or monitor treatment. Performed at [facility], [place] [address] [place], [place] [address]     Coagulation Studies: Recent Labs    10/09/19 1643 10/10/19 0208 10/10/19 0831 10/10/19 2135 10/11/19 0414  LABPROT 26.9* 27.2* 25.3* 23.0* 23.9*  INR 2.5* 2.5* 2.3* 2.1* 2.1*    Urinalysis: Recent Labs    10/10/19 0100  COLORURINE AMBER*  LABSPEC 1.030  PHURINE 5.0  GLUCOSEU 50*  HGBUR LARGE*  BILIRUBINUR NEGATIVE  KETONESUR NEGATIVE  PROTEINUR 100*  NITRITE NEGATIVE  LEUKOCYTESUR TRACE*      Imaging: US RENAL  Result Date: 10/10/2019 CLINICAL DATA:  Acute renal failure. EXAM: RENAL / URINARY TRACT ULTRASOUND COMPLETE COMPARISON:  Abdominopelvic CT 09/12/2019 FINDINGS: Right Kidney: Renal measurements: 12.8 x 5.6 x 6.5 cm = volume: 245 mL. Echogenicity within normal limits. No mass or hydronephrosis visualized. Left Kidney: Renal measurements: 13.3 x 5.9 x 4.8 cm = volume: 193 mL. Echogenicity within normal limits. No mass or hydronephrosis visualized. Bladder: Only minimally distended.  Neither ureteral jet is visualized. Other: Small-moderate volume abdominopelvic ascites. Decreased ascites from 09/12/2019 abdominal CT. IMPRESSION: 1. Unremarkable sonographic appearance of the kidneys. No obstructive uropathy. 2. Urinary bladder poorly distended, not well evaluated. 3. Incidental abdominopelvic ascites, diminished ascites from CT last month. Electronically Signed   By: Keith Rake M.D.   On: 10/10/2019 20:30     Medications:   . sodium chloride    . sodium chloride 10 mL/hr at 10/11/19 0750  . [MAR Hold] ceFEPime (MAXIPIME) IV 2 g (10/10/19 2142)  .  sodium bicarbonate  infusion 1000 mL 50 mL/hr (10/11/19 0750)   . [MAR Hold] sodium chloride   Intravenous Once  . [MAR Hold] Chlorhexidine Gluconate Cloth  6 each Topical Daily  . [MAR Hold] ferrous sulfate  325 mg Oral Q breakfast  . [MAR Hold] insulin aspart  0-15 Units Subcutaneous TID WC  . povidone-iodine  2 application Topical Once  . [MAR Hold] Ensure Max Protein  11 oz Oral Daily  . [MAR Hold] sodium chloride flush  10-40 mL Intracatheter Q12H  . [MAR Hold] sodium zirconium cyclosilicate  10 g Oral Once  . tranexamic acid (CYKLOKAPRON) topical - INTRAOP  2,000 mg Topical Once   [MAR Hold]  HYDROmorphone (DILAUDID) injection, [MAR  Hold] metoCLOPramide **OR** [MAR Hold] metoCLOPramide (REGLAN) injection, [MAR Hold] ondansetron **OR** [MAR Hold] ondansetron (ZOFRAN) IV, [MAR Hold] oxyCODONE, [MAR Hold] sodium chloride flush, [MAR Hold] zolpidem  Assessment/ Plan:  1.Acute kidney injury there is very marginal urine output.  Urine sediment does appear to have some red blood cells and proteinuria although the timing of this acute kidney injury appears to be in the setting of surgery.  There was a CT scan done of the abdomen and pelvis 09/12/2019 with contrast.  There is also a paracentesis that was performed 09/13/2019.  Neither of these events seem to have precipitated the acute kidney injury.  My working diagnosis that this represents some ischemic acute tubular necrosis and should improve.    No hydronephrosis on renal ultrasound.  I would continue to avoid nephrotoxins ACE inhibitor's ARB's and vancomycin.  I would also continue to avoid use of IV contrast.  It appears that there is appropriate dosing of medications.    Renal function appears to have improved.  I think this is most consistent with ATN. 2. Hypertension/volume  -appears to be relatively stable at this point did receive some sodium chloride boluses.    Will continue to monitor. 3.  Diabetes mellitus per primary team 4.  Hepatic cirrhosis continue supportive therapy. 5.  Poor wound healing and recurrent abscess appreciate assistance from infectious disease.  Debridement 10/11/2019 appreciate assistance of Dr. Sharol Given   LOS: [place] @TODAY @8 :36 AM

## 2019-10-11 NOTE — Anesthesia Postprocedure Evaluation (Signed)
Anesthesia Post Note  Patient: Robert Sullivan  Procedure(s) Performed: IRRIGATION AND DEBRIDEMENT HIP (Left Hip)     Patient location during evaluation: PACU Anesthesia Type: General Level of consciousness: awake and alert Pain management: pain level controlled Vital Signs Assessment: post-procedure vital signs reviewed and stable Respiratory status: spontaneous breathing, nonlabored ventilation, respiratory function stable and patient connected to nasal cannula oxygen Cardiovascular status: blood pressure returned to baseline and stable Postop Assessment: no apparent nausea or vomiting Anesthetic complications: no    Last Vitals:  Vitals:   10/11/19 1621 10/11/19 1942  BP: 125/71 125/71  Pulse: 85 82  Resp: 16 13  Temp: 36.7 C 36.5 C  SpO2: 97% 95%    Last Pain:  Vitals:   10/11/19 1942  TempSrc: Axillary  PainSc:                  Johnanna Bakke COKER

## 2019-10-11 NOTE — Progress Notes (Signed)
Patient ID: Robert Sullivan, male   DOB: 1965-09-01, 54 y.o.   MRN: [id number]         [facility]  Date of Admission:  10/08/2019   Total days of antibiotics 23        Day 2 cefepime         ASSESSMENT: Mr. Barnett is doing better today.  His liver enzymes and creatinine are trending back down.  Dr. Sharol Given took him back to the OR today for wound debridement and closure.  His operative note indicates that he did not find any evidence of ischemia, necrosis, purulence or abscess.  Wound culture from 2 days ago has grown Enterobacter again.  It is not clear to me how much infection is playing a role in poor wound healing but I favor continuing cefepime for now.  PLAN: 1. Continue cefepime for now  Principal Problem:   Wound dehiscence Active Problems:   Type 2 diabetes mellitus without complication (HCC)   Obesity, Class III, BMI 40-49.9 (morbid obesity) (HCC)   Cirrhosis of liver with ascites (HCC)   Abscess, gluteal, left   Anemia   AKI (acute kidney injury) ([place])   Scheduled Meds: . sodium chloride   Intravenous Once  . Chlorhexidine Gluconate Cloth  6 each Topical Daily  . docusate sodium  100 mg Oral BID  . ferrous sulfate  325 mg Oral Q breakfast  . insulin aspart  0-15 Units Subcutaneous TID WC  . Ensure Max Protein  11 oz Oral Daily  . sodium chloride flush  10-40 mL Intracatheter Q12H  . sodium zirconium cyclosilicate  10 g Oral Once  . tranexamic acid (CYKLOKAPRON) topical - INTRAOP  2,000 mg Topical Once   Continuous Infusions: . sodium chloride    . sodium chloride 200 mL/hr at 10/11/19 0840  . sodium chloride 10 mL/hr at 10/11/19 1002  . albumin human 12.5 g (10/11/19 1135)  . ceFEPime (MAXIPIME) IV 2 g (10/11/19 1045)  . methocarbamol (ROBAXIN) IV     PRN Meds:.[START ON 10/12/2019] acetaminophen, bisacodyl, HYDROmorphone (DILAUDID) injection, HYDROmorphone (DILAUDID) injection, magnesium citrate, menthol-cetylpyridinium, methocarbamol **OR**  methocarbamol (ROBAXIN) IV, ondansetron **OR** ondansetron (ZOFRAN) IV, ondansetron **OR** ondansetron (ZOFRAN) IV, oxyCODONE, oxyCODONE, oxyCODONE, phenol, polyethylene glycol, sodium chloride flush, zolpidem   SUBJECTIVE: He says that he is feeling much better today.  Review of Systems: Review of Systems  Constitutional: Negative for fever.    Allergies  Allergen Reactions  . Sulfa Antibiotics Rash and Other (See Comments)    Total body rash    OBJECTIVE: Vitals:   10/11/19 0932 10/11/19 0953 10/11/19 1141 10/11/19 1248  BP:  134/68 (!) 146/72 134/71  Pulse: 84 77 90 95  Resp: 17 15 14 19   Temp: 97.7 F (36.5 C) 97.6 F (36.4 C) 98 F (36.7 C)   TempSrc:  Axillary Axillary   SpO2: 97% 98% 97% 97%  Weight:      Height:       Body mass index is 41.98 kg/m.  Physical Exam Constitutional:      Comments: He is alert and in very good spirits today.  His color is much improved.  Musculoskeletal:     Comments: He has a new VAC wound dressing over his left hip incision.  Skin:    Comments: Right arm PICC site looks good.     Lab Results Lab Results  Component Value Date   WBC 5.8 10/11/2019   HGB 7.3 (L) 10/11/2019   HCT 22.1 (  L) 10/11/2019   MCV 87.7 10/11/2019   PLT 73 (L) 10/11/2019    Lab Results  Component Value Date   CREATININE 1.69 (H) 10/11/2019   BUN 32 (H) 10/11/2019   NA 134 (L) 10/11/2019   K 3.9 10/11/2019   CL 103 10/11/2019   CO2 23 10/11/2019    Lab Results  Component Value Date   ALT 551 (H) 10/11/2019   AST 995 (H) 10/11/2019   ALKPHOS 117 10/11/2019   BILITOT 2.2 (H) 10/11/2019     Microbiology: Recent Results (from the past 240 hour(s))  Respiratory Panel by RT PCR (Flu A&B, Covid) - Nasopharyngeal Swab     Status: None   Collection Time: 10/08/19 11:41 AM   Specimen: Nasopharyngeal Swab  Result Value Ref Range Status   SARS Coronavirus 2 by RT PCR NEGATIVE NEGATIVE Final    Comment: (NOTE) SARS-CoV-2 target nucleic acids  are NOT DETECTED. The SARS-CoV-2 RNA is generally detectable in upper respiratoy specimens during the acute phase of infection. The lowest concentration of SARS-CoV-2 viral copies this assay can detect is 131 copies/mL. A negative result does not preclude SARS-Cov-2 infection and should not be used as the sole basis for treatment or other patient management decisions. A negative result may occur with  improper specimen collection/handling, submission of specimen other than nasopharyngeal swab, presence of viral mutation(s) within the areas targeted by this assay, and inadequate number of viral copies (<131 copies/mL). A negative result must be combined with clinical observations, patient history, and epidemiological information. The expected result is Negative. Fact Sheet for Patients:  PinkCheek.be Fact Sheet for Healthcare Providers:  GravelBags.it This test is not yet ap proved or cleared by the [place] FDA and  has been authorized for detection and/or diagnosis of SARS-CoV-2 by FDA under an Emergency Use Authorization (EUA). This EUA will remain  in effect (meaning this test can be used) for the duration of the COVID-19 declaration under Section 564(b)(1) of the Act, 21 U.S.C. section 360bbb-3(b)(1), unless the authorization is terminated or revoked sooner.    Influenza A by PCR NEGATIVE NEGATIVE Final   Influenza B by PCR NEGATIVE NEGATIVE Final    Comment: (NOTE) The Xpert Xpress SARS-CoV-2/FLU/RSV assay is intended as an aid in  the diagnosis of influenza from Nasopharyngeal swab specimens and  should not be used as a sole basis for treatment. Nasal washings and  aspirates are unacceptable for Xpert Xpress SARS-CoV-2/FLU/RSV  testing. Fact Sheet for Patients: PinkCheek.be Fact Sheet for Healthcare Providers: GravelBags.it This test is not yet approved or  cleared by the [place] FDA and  has been authorized for detection and/or diagnosis of SARS-CoV-2 by  FDA under an Emergency Use Authorization (EUA). This EUA will remain  in effect (meaning this test can be used) for the duration of the  Covid-19 declaration under Section 564(b)(1) of the Act, 21  U.S.C. section 360bbb-3(b)(1), unless the authorization is  terminated or revoked. Performed at [facility], [place] [address], [place], [place] [address]   Aerobic/Anaerobic Culture (surgical/deep wound)     Status: None (Preliminary result)   Collection Time: 10/08/19  1:20 PM   Specimen: Wound  Result Value Ref Range Status   Specimen Description WOUND  Final   Special Requests LEFT HIP  Final   Gram Stain   Final    ABUNDANT WBC PRESENT,BOTH PMN AND MONONUCLEAR NO ORGANISMS SEEN Performed at [facility], [address], [place],  [address]  Culture   Final    MODERATE ENTEROBACTER SPECIES NO ANAEROBES ISOLATED; CULTURE IN PROGRESS FOR 5 DAYS    Report Status PENDING  Incomplete   Organism ID, Bacteria ENTEROBACTER SPECIES  Final      Susceptibility   Enterobacter species - MIC*    CEFAZOLIN >=64 RESISTANT Resistant     CEFEPIME <=0.12 SENSITIVE Sensitive     CEFTAZIDIME <=1 SENSITIVE Sensitive     CIPROFLOXACIN <=0.25 SENSITIVE Sensitive     GENTAMICIN <=1 SENSITIVE Sensitive     IMIPENEM <=0.25 SENSITIVE Sensitive     TRIMETH/SULFA <=20 SENSITIVE Sensitive     PIP/TAZO <=4 SENSITIVE Sensitive     * MODERATE ENTEROBACTER SPECIES  Culture, blood (routine x 2)     Status: None (Preliminary result)   Collection Time: 10/10/19  1:50 AM   Specimen: BLOOD LEFT HAND  Result Value Ref Range Status   Specimen Description BLOOD LEFT HAND  Final   Special Requests   Final    BOTTLES DRAWN AEROBIC AND ANAEROBIC Blood Culture results may not be optimal due to an excessive volume of blood received in culture bottles   Culture   Final    NO GROWTH < 12  HOURS Performed at [facility], [place] [address] [place], [place] [address]    Report Status PENDING  Incomplete  Culture, blood (routine x 2)     Status: None (Preliminary result)   Collection Time: 10/10/19  2:46 AM   Specimen: BLOOD  Result Value Ref Range Status   Specimen Description BLOOD LEFT WRIST  Final   Special Requests   Final    BOTTLES DRAWN AEROBIC AND ANAEROBIC Blood Culture adequate volume   Culture   Final    NO GROWTH < 12 HOURS Performed at [facility], [place] [address] [place], [place] [address]    Report Status PENDING  Incomplete  Surgical pcr screen     Status: None   Collection Time: 10/10/19  8:16 AM   Specimen: Nasal Mucosa; Nasal Swab  Result Value Ref Range Status   MRSA, PCR NEGATIVE NEGATIVE Final   Staphylococcus aureus NEGATIVE NEGATIVE Final    Comment: (NOTE) The Xpert SA Assay (FDA approved for NASAL specimens in patients 73 years of age and older), is one component of a comprehensive surveillance program. It is not intended to diagnose infection nor to guide or monitor treatment. Performed at [facility], [place] [address], [place], [place] [address]     Michel Bickers, [place] [facility] [phone] pager   [phone] cell 10/11/2019, 2:21 PM

## 2019-10-11 NOTE — Progress Notes (Signed)
Pharmacy Antibiotic Note  Robert Sullivan is a 54 y.o. male admitted on 10/08/2019 with hip wound.  Pharmacy has been consulted for Cefepime dosing.   ID: Cefepime D#2 for worsening hip wound infxn with recent MSSA bacteremia . [name]. - Afebrile, WBC 5.8 down, SCr 1.69 down, LA 1.9 - 4/3: L hip I&D  Cefepime 4/1>> Amp PTA>>4/1  3/31 Hip wound - enterobacter R Ancef   Plan: Increase Cefepime to 2g IV q8hrs with improved SCr     Height: 6' 2"  (188 cm) Weight: (!) 148.3 kg (326 lb 15.1 oz) IBW/kg (Calculated) : 82.2  Temp (24hrs), Avg:98.4 F (36.9 C), Min:97.5 F (36.4 C), Max:99.1 F (37.3 C)  Recent Labs  Lab 10/06/19 1015 10/06/19 1015 10/09/19 0101 10/09/19 1643 10/10/19 0208 10/10/19 0830 10/10/19 0831 10/11/19 0414  WBC 4.9   < > 17.8* 27.3* 19.0*  --  10.6* 5.8  CREATININE 0.70  --   --  2.38* 2.73*  --  2.56* 1.69*  LATICACIDVEN  --   --   --   --  4.9* 1.9  --   --    < > = values in this interval not displayed.    Estimated Creatinine Clearance: 78.5 mL/min (A) (by C-G formula based on SCr of 1.69 mg/dL (H)).    Allergies  Allergen Reactions  . Sulfa Antibiotics Rash and Other (See Comments)    Total body rash    Robert Sullivan S. Alford [place], PharmD, BCPS Clinical Staff Pharmacist [email or web address]  Wayland Salinas 10/11/2019 9:54 AM

## 2019-10-11 NOTE — Op Note (Signed)
10/11/2019  9:13 AM  PATIENT:  Robert Sullivan    PRE-OPERATIVE DIAGNOSIS:  Left Hip Wound Dehiscence  POST-OPERATIVE DIAGNOSIS:  Same  PROCEDURE:  IRRIGATION AND DEBRIDEMENT HIP With further excision of skin and soft tissue muscle and fascia. 2 g of TXA topical. 1 g of TXA IV. Application of customizable.  Wound VAC. Local tissue rearrangement for wound closure 20 x 7 cm.  SURGEON:  Newt Minion, MD  PHYSICIAN ASSISTANT:None ANESTHESIA:   General  PREOPERATIVE INDICATIONS:  Robert Sullivan is a  54 y.o. male with a diagnosis of Left Hip Wound Dehiscence who failed conservative measures and elected for surgical management.    The risks benefits and alternatives were discussed with the patient preoperatively including but not limited to the risks of infection, bleeding, nerve injury, cardiopulmonary complications, the need for revision surgery, among others, and the patient was willing to proceed.  OPERATIVE IMPLANTS: Praveena wound VAC.  @ENCIMAGES @  OPERATIVE FINDINGS: Improved granulation tissue along the wound edges no ischemic or necrotic changes.  No purulence no abscess.  OPERATIVE PROCEDURE: Patient was brought the operating room and underwent a general anesthetic.  After adequate levels anesthesia were obtained patient's left lower extremity was bumped up into a floppy lateral position the left lower extremity was prepped using DuraPrep draped into a sterile field a timeout was called.  The sutures were removed the wound was irrigated with pulsatile lavage 3 L.  A rondure and 21 blade knife were used for further excisional debridement of skin and soft tissue muscle and fascia.  Electrocautery was used for hemostasis the wound was soaked with 2 g topical TXA.  This was then removed and the wound edges were clean and dry.  Local tissue rearrangement was used to close the wound 20 x 7 cm.  A customizable Prevena wound VAC was applied this had a good suction fit there was no drainage in  the wound VAC canister.  Patient was extubated taken the PACU in stable condition.   DISCHARGE PLANNING:  Antibiotic duration: Continue IV antibiotics per infectious disease  Weightbearing: Weightbearing as tolerated  Pain medication: Opioid pathway  Dressing care/ Wound VAC: Continue wound VAC for 1 week  Ambulatory devices: Walker or crutches  Discharge to: Anticipate discharge to home.  Follow-up: In the office 1 week post operative.

## 2019-10-11 NOTE — Interval H&P Note (Signed)
History and Physical Interval Note:  10/11/2019 7:51 AM  Robert Sullivan  has presented today for surgery, with the diagnosis of Left Hip Wound Dehiscence.  The various methods of treatment have been discussed with the patient and family. After consideration of risks, benefits and other options for treatment, the patient has consented to  Procedure(s): IRRIGATION AND DEBRIDEMENT HIP (Left) as a surgical intervention.  The patient's history has been reviewed, patient examined, no change in status, stable for surgery.  I have reviewed the patient's chart and labs.  Questions were answered to the patient's satisfaction.     Newt Minion

## 2019-10-11 NOTE — Progress Notes (Signed)
Patient in the OR during morning rounds so not seen today but labs evaluated and was glad to see liver tests decreasing as well as BUN and creatinine and please call me if any question or problem from my standpoint today otherwise I will check on tomorrow

## 2019-10-11 NOTE — Progress Notes (Addendum)
PROGRESS NOTE    Robert Sullivan  [name] DOB: December 31, 1965 DOA: 10/08/2019 PCP: Cathleen Corti, PA-C   Brief Narrative:  Mr. Robert Sullivan is a 54 y.o. M with history of morbid obesity, BMI 17, type 2 diabetes mellitus, OSA not on CPAP,  NASH cirrhosis and recurrent hip infection who was admitted by orthopedics on 3/31 with wound dehiscence. -Hospitalized in early December with MSSA bacteremia, large left gluteal abscess, underwent I&D, TEE was negative for endocarditis, discharged on IV Ancef for 6 weeks, unfortunately worsening of his abscess led to readmission on 12/30, underwent repeat I&D, abscess cultures this time grew Enterobacter, then discharged on IV cefepime for plan of 4 weeks of therapy . -Subsequently developed anasarca, work-up noted possibility of cirrhosis, I was seen by Robert Sullivan on 3/4 and sent to the ED with severe volume overload, he was noted to have ascites and recurrent left gluteal abscess during this hospitalization, underwent I&D again, cultures this time grew Enterococcus, subsequently discharged on IV ampicillin, completed 3 weeks of therapy but developed wound dehiscence and readmitted 3/31 for another surgery  Assessment & Plan:  Recurrent gluteal abscess Wound dehiscence -Initially with MSSA, completed Ancef course -Subsequently grew Enterobacter and recently Enterococcus -Admitted by orthopedics for wound dehiscence, underwent excisional debridement on 3/31  -Followed by irrigation and debridement with further excision of skin, soft tissue muscle and fascia, application of wound VAC by Dr. Sharol Given on 4/3 today -Infectious disease consulting -Currently on cefepime -Follow-up OR cultures    NASH/cirrhosis Shock liver suspected LFTs elevated since 2019 and CT imaging early Mar 2021 showing stigmata of chronic liver disease.  Hep C negative, no significant EtOH, suspect NASH.   -LFTs trended up considerably yesterday, AST of 1900 and ALT of 680 -Now improving -?   Shock liver, BP did drop to the 70-80s, earlier this admission, mostly in the 90-100 range -Trend, monitor  Acute kidney injury Hyperkalemia Acidosis, elevated gap  -Likely ATN from hypotension  -Appreciate nephrology input  -Blood pressure improving, more stable, wrist did receive blood transfusion yesterday   Morbid obesity BMI 49, hx diabetes.  Diabetes -CBGs are stable -Continue SSI  Anemia of acute blood loss on anemia of chronic disease -Did receive a total of 7 units of PRBC this admission, at least 3 or 4 during surgery -Check anemia panel -No overt GI bleeding noted at this time, gastroenterology following  Other medications -Hold aspirin  Moderate protein calorie malnutrition As evidenced by muscle and fat depletion, thenar wasting, chronic disease and recurrent infection and liver failure and albumin 1.8. -Consult dietitian  DVT prophylaxis: SCDs Code Status: FULL Family Communication: No family at bedside, will update spouse Disposition: Home pending stability in decompensated cirrhosis, ongoing management of gluteal abscess   Consultants:   Orthopedics  Critical Care  ID  GI  Nephrology  Procedures:   3/31 excisional debridement by Dr. Sharol Given  4/3 PROCEDURE:  IRRIGATION AND [place] With further excision of skin and soft tissue muscle and fascia. 2 g of TXA topical. 1 g of TXA IV. Application of customizable.  Wound VAC. Local tissue rearrangement for wound closure 20 x 7 cm.  Antimicrobials:   Ampicillin 3/31 >> 4/1   Cefepime 4/2 >>   Culture data:   3/31 wound culture -- GNRs  4/2 blood cultures -- pending     Subjective: -Feels better today, in good spirits, back from the OR earlier this morning  Objective: Vitals:   10/11/19 0932 10/11/19 0953 10/11/19 1141 10/11/19 1248  BP:  134/68 (!) 146/72 134/71  Pulse: 84 77 90 95  Resp: 17 15 14 19   Temp: 97.7 F (36.5 C) 97.6 F (36.4 C) 98 F (36.7 C)   TempSrc:   Axillary Axillary   SpO2: 97% 98% 97% 97%  Weight:      Height:        Intake/Output Summary (Last 24 hours) at 10/11/2019 1421 Last data filed at 10/11/2019 1000 Gross per 24 hour  Intake 930 ml  Output 1210 ml  Net -280 ml   Filed Weights   10/08/19 1140  Weight: (!) 148.3 kg    Examination: Gen: Obese chronically ill male sitting up in bed, awake alert oriented x3, no distress HEENT: Neck obese unable to assess JVD, positive icterus Lungs: Distant breath sounds, especially diminished at bases CVS: S1-S2, regular rate rhythm Abd: Soft, obese, nontender, nondistended, do not appreciate fluid thrill, bowel sounds present Extremities: No edema, dressing noted over his left hip Skin: As above  Data Reviewed: I have personally reviewed following labs and imaging studies:  CBC: Recent Labs  Lab 10/06/19 1015 10/08/19 1816 10/09/19 0101 10/09/19 1129 10/09/19 1643 10/10/19 0208 10/10/19 0831 10/10/19 2135 10/11/19 0414  WBC 4.9  --  17.8*  --  27.3* 19.0* 10.6*  --  5.8  NEUTROABS 3.3  --   --   --   --   --   --   --   --   HGB 8.4*   < > 7.8*   < > 7.3* 7.8* 6.1* 7.4* 7.3*  HCT 27.2*   < > 24.6*   < > 23.0* 23.6* 18.5* 22.3* 22.1*  MCV 85.5  --  84.5  --  87.8 87.1 86.4  --  87.7  PLT 104*  --  238  --  323 167 PLATELET CLUMPS NOTED ON SMEAR, UNABLE TO ESTIMATE  --  73*   < > = values in this interval not displayed.   Basic Metabolic Panel: Recent Labs  Lab 10/06/19 1015 10/09/19 1643 10/10/19 0208 10/10/19 0831 10/11/19 0414  NA 136 134* 134* 135 134*  K 3.5 6.0* 5.9* 5.0 3.9  CL 104 102 106 106 103  CO2 25 14* 17* 21* 23  GLUCOSE 113* 149* 195* 172* 263*  BUN 15 26* 29* 31* 32*  CREATININE 0.70 2.38* 2.73* 2.56* 1.69*  CALCIUM 7.9* 8.3* 8.1* 7.9* 8.0*  PHOS  --   --   --   --  2.8   GFR: Estimated Creatinine Clearance: 78.5 mL/min (A) (by C-G formula based on SCr of 1.69 mg/dL (H)). Liver Function Tests: Recent Labs  Lab 10/09/19 1643 10/10/19 0208  10/10/19 0831 10/11/19 0414  AST 449* 1,855* 1,944* 995*  ALT 178* 668* 684* 551*  ALKPHOS 91 109 105 117  BILITOT 2.8* 2.9* 2.5* 2.2*  PROT 4.6* 4.4* 4.3* 4.8*  ALBUMIN 1.9* 1.8* 1.9* 2.2*   No results for input(s): LIPASE, AMYLASE in the last 168 hours. No results for input(s): AMMONIA in the last 168 hours. Coagulation Profile: Recent Labs  Lab 10/09/19 1643 10/10/19 0208 10/10/19 0831 10/10/19 2135 10/11/19 0414  INR 2.5* 2.5* 2.3* 2.1* 2.1*   Cardiac Enzymes: No results for input(s): CKTOTAL, CKMB, CKMBINDEX, TROPONINI in the last 168 hours. BNP (last 3 results) No results for input(s): PROBNP in the last 8760 hours. HbA1C: No results for input(s): HGBA1C in the last 72 hours. CBG: Recent Labs  Lab 10/10/19 1554 10/10/19 2118 10/11/19 0600 10/11/19 0858 10/11/19 1144  GLUCAP 200* 194* 186* 182* 234*   Lipid Profile: No results for input(s): CHOL, HDL, LDLCALC, TRIG, CHOLHDL, LDLDIRECT in the last 72 hours. Thyroid Function Tests: No results for input(s): TSH, T4TOTAL, FREET4, T3FREE, THYROIDAB in the last 72 hours. Anemia Panel: No results for input(s): VITAMINB12, FOLATE, FERRITIN, TIBC, IRON, RETICCTPCT in the last 72 hours. Urine analysis:    Component Value Date/Time   COLORURINE AMBER (A) 10/10/2019 0100   APPEARANCEUR CLOUDY (A) 10/10/2019 0100   LABSPEC 1.030 10/10/2019 0100   PHURINE 5.0 10/10/2019 0100   GLUCOSEU 50 (A) 10/10/2019 0100   HGBUR LARGE (A) 10/10/2019 0100   BILIRUBINUR NEGATIVE 10/10/2019 0100   KETONESUR NEGATIVE 10/10/2019 0100   PROTEINUR 100 (A) 10/10/2019 0100   NITRITE NEGATIVE 10/10/2019 0100   LEUKOCYTESUR TRACE (A) 10/10/2019 0100   Sepsis Labs: @LABRCNTIP (procalcitonin:4,lacticacidven:4)  ) Recent Results (from the past 240 hour(s))  Respiratory Panel by RT PCR (Flu A&B, Covid) - Nasopharyngeal Swab     Status: None   Collection Time: 10/08/19 11:41 AM   Specimen: Nasopharyngeal Swab  Result Value Ref Range  Status   SARS Coronavirus 2 by RT PCR NEGATIVE NEGATIVE Final    Comment: (NOTE) SARS-CoV-2 target nucleic acids are NOT DETECTED. The SARS-CoV-2 RNA is generally detectable in upper respiratoy specimens during the acute phase of infection. The lowest concentration of SARS-CoV-2 viral copies this assay can detect is 131 copies/mL. A negative result does not preclude SARS-Cov-2 infection and should not be used as the sole basis for treatment or other patient management decisions. A negative result may occur with  improper specimen collection/handling, submission of specimen other than nasopharyngeal swab, presence of viral mutation(s) within the areas targeted by this assay, and inadequate number of viral copies (<131 copies/mL). A negative result must be combined with clinical observations, patient history, and epidemiological information. The expected result is Negative. Fact Sheet for Patients:  PinkCheek.be Fact Sheet for Healthcare Providers:  GravelBags.it This test is not yet ap proved or cleared by the [place] FDA and  has been authorized for detection and/or diagnosis of SARS-CoV-2 by FDA under an Emergency Use Authorization (EUA). This EUA will remain  in effect (meaning this test can be used) for the duration of the COVID-19 declaration under Section 564(b)(1) of the Act, 21 U.S.C. section 360bbb-3(b)(1), unless the authorization is terminated or revoked sooner.    Influenza A by PCR NEGATIVE NEGATIVE Final   Influenza B by PCR NEGATIVE NEGATIVE Final    Comment: (NOTE) The Xpert Xpress SARS-CoV-2/FLU/RSV assay is intended as an aid in  the diagnosis of influenza from Nasopharyngeal swab specimens and  should not be used as a sole basis for treatment. Nasal washings and  aspirates are unacceptable for Xpert Xpress SARS-CoV-2/FLU/RSV  testing. Fact Sheet for  Patients: PinkCheek.be Fact Sheet for Healthcare Providers: GravelBags.it This test is not yet approved or cleared by the [place] FDA and  has been authorized for detection and/or diagnosis of SARS-CoV-2 by  FDA under an Emergency Use Authorization (EUA). This EUA will remain  in effect (meaning this test can be used) for the duration of the  Covid-19 declaration under Section 564(b)(1) of the Act, 21  U.S.C. section 360bbb-3(b)(1), unless the authorization is  terminated or revoked. Performed at [facility], [place] [address], [place], [place] [address]   Aerobic/Anaerobic Culture (surgical/deep wound)     Status: None (Preliminary result)   Collection Time: 10/08/19  1:20 PM   Specimen: Wound  Result Value Ref Range Status   Specimen Description WOUND  Final   Special Requests LEFT HIP  Final   Gram Stain   Final    ABUNDANT WBC PRESENT,BOTH PMN AND MONONUCLEAR NO ORGANISMS SEEN Performed at [facility], [address], [place], [place] [address]    Culture   Final    MODERATE ENTEROBACTER SPECIES NO ANAEROBES ISOLATED; CULTURE IN PROGRESS FOR 5 DAYS    Report Status PENDING  Incomplete   Organism ID, Bacteria ENTEROBACTER SPECIES  Final      Susceptibility   Enterobacter species - MIC*    CEFAZOLIN >=64 RESISTANT Resistant     CEFEPIME <=0.12 SENSITIVE Sensitive     CEFTAZIDIME <=1 SENSITIVE Sensitive     CIPROFLOXACIN <=0.25 SENSITIVE Sensitive     GENTAMICIN <=1 SENSITIVE Sensitive     IMIPENEM <=0.25 SENSITIVE Sensitive     TRIMETH/SULFA <=20 SENSITIVE Sensitive     PIP/TAZO <=4 SENSITIVE Sensitive     * MODERATE ENTEROBACTER SPECIES  Culture, blood (routine x 2)     Status: None (Preliminary result)   Collection Time: 10/10/19  1:50 AM   Specimen: BLOOD LEFT HAND  Result Value Ref Range Status   Specimen Description BLOOD LEFT HAND  Final   Special Requests   Final    BOTTLES DRAWN AEROBIC  AND ANAEROBIC Blood Culture results may not be optimal due to an excessive volume of blood received in culture bottles   Culture   Final    NO GROWTH < 12 HOURS Performed at [facility], [place] [address], [place], [place] [address]    Report Status PENDING  Incomplete  Culture, blood (routine x 2)     Status: None (Preliminary result)   Collection Time: 10/10/19  2:46 AM   Specimen: BLOOD  Result Value Ref Range Status   Specimen Description BLOOD LEFT WRIST  Final   Special Requests   Final    BOTTLES DRAWN AEROBIC AND ANAEROBIC Blood Culture adequate volume   Culture   Final    NO GROWTH < 12 HOURS Performed at [facility], [place] [address], [place], [place] [address]    Report Status PENDING  Incomplete  Surgical pcr screen     Status: None   Collection Time: 10/10/19  8:16 AM   Specimen: Nasal Mucosa; Nasal Swab  Result Value Ref Range Status   MRSA, PCR NEGATIVE NEGATIVE Final   Staphylococcus aureus NEGATIVE NEGATIVE Final    Comment: (NOTE) The Xpert SA Assay (FDA approved for NASAL specimens in patients 17 years of age and older), is one component of a comprehensive surveillance program. It is not intended to diagnose infection nor to guide or monitor treatment. Performed at [facility], [place] [address] [place], [place] [address]          Radiology Studies: US RENAL  Result Date: 10/10/2019 CLINICAL DATA:  Acute renal failure. EXAM: RENAL / URINARY TRACT ULTRASOUND COMPLETE COMPARISON:  Abdominopelvic CT 09/12/2019 FINDINGS: Right Kidney: Renal measurements: 12.8 x 5.6 x 6.5 cm = volume: 245 mL. Echogenicity within normal limits. No mass or hydronephrosis visualized. Left Kidney: Renal measurements: 13.3 x 5.9 x 4.8 cm = volume: 193 mL. Echogenicity within normal limits. No mass or hydronephrosis visualized. Bladder: Only minimally distended.  Neither ureteral jet is visualized. Other: Small-moderate volume abdominopelvic ascites. Decreased ascites  from 09/12/2019 abdominal CT. IMPRESSION: 1. Unremarkable sonographic appearance of the kidneys. No obstructive uropathy. 2. Urinary bladder  poorly distended, not well evaluated. 3. Incidental abdominopelvic ascites, diminished ascites from CT last month. Electronically Signed   By: Keith Rake M.D.   On: 10/10/2019 20:30        Scheduled Meds: . sodium chloride   Intravenous Once  . Chlorhexidine Gluconate Cloth  6 each Topical Daily  . docusate sodium  100 mg Oral BID  . ferrous sulfate  325 mg Oral Q breakfast  . insulin aspart  0-15 Units Subcutaneous TID WC  . Ensure Max Protein  11 oz Oral Daily  . sodium chloride flush  10-40 mL Intracatheter Q12H  . sodium zirconium cyclosilicate  10 g Oral Once  . tranexamic acid (CYKLOKAPRON) topical - INTRAOP  2,000 mg Topical Once   Continuous Infusions: . sodium chloride    . sodium chloride 200 mL/hr at 10/11/19 0840  . sodium chloride 10 mL/hr at 10/11/19 1002  . albumin human 12.5 g (10/11/19 1135)  . ceFEPime (MAXIPIME) IV 2 g (10/11/19 1045)  . methocarbamol (ROBAXIN) IV       LOS: 3 days    Time spent: 35 minutes  Domenic Polite, MD [facility] 10/11/2019, 2:21 PM

## 2019-10-12 LAB — BPAM RBC
Blood Product Expiration Date: 202104282359
Blood Product Expiration Date: 202104282359
Blood Product Expiration Date: 202104292359
Blood Product Expiration Date: 202104292359
Blood Product Expiration Date: 202104292359
Blood Product Expiration Date: 202104302359
Blood Product Expiration Date: 202104302359
Blood Product Expiration Date: 202104302359
Blood Product Expiration Date: 202104302359
ISSUE DATE / TIME: 202103311851
ISSUE DATE / TIME: 202103311851
ISSUE DATE / TIME: 202104010212
ISSUE DATE / TIME: 202104011638
ISSUE DATE / TIME: 202104012112
ISSUE DATE / TIME: 202104021238
ISSUE DATE / TIME: 202104021526
Unit Type and Rh: 6200
Unit Type and Rh: 6200
Unit Type and Rh: 6200
Unit Type and Rh: 6200
Unit Type and Rh: 6200
Unit Type and Rh: 6200
Unit Type and Rh: 6200
Unit Type and Rh: 6200
Unit Type and Rh: 6200

## 2019-10-12 LAB — CBC WITH DIFFERENTIAL/PLATELET
Abs Immature Granulocytes: 0.04 10*3/uL (ref 0.00–0.07)
Basophils Absolute: 0.1 10*3/uL (ref 0.0–0.1)
Basophils Relative: 1 %
Eosinophils Absolute: 0.2 10*3/uL (ref 0.0–0.5)
Eosinophils Relative: 3 %
HCT: 22.5 % — ABNORMAL LOW (ref 39.0–52.0)
Hemoglobin: 7.1 g/dL — ABNORMAL LOW (ref 13.0–17.0)
Immature Granulocytes: 1 %
Lymphocytes Relative: 22 %
Lymphs Abs: 1 10*3/uL (ref 0.7–4.0)
MCH: 28.2 pg (ref 26.0–34.0)
MCHC: 31.6 g/dL (ref 30.0–36.0)
MCV: 89.3 fL (ref 80.0–100.0)
Monocytes Absolute: 0.5 10*3/uL (ref 0.1–1.0)
Monocytes Relative: 11 %
Neutro Abs: 2.9 10*3/uL (ref 1.7–7.7)
Neutrophils Relative %: 62 %
Platelets: 73 10*3/uL — ABNORMAL LOW (ref 150–400)
RBC: 2.52 MIL/uL — ABNORMAL LOW (ref 4.22–5.81)
RDW: 17.7 % — ABNORMAL HIGH (ref 11.5–15.5)
WBC: 4.7 10*3/uL (ref 4.0–10.5)
nRBC: 0 % (ref 0.0–0.2)

## 2019-10-12 LAB — TYPE AND SCREEN
ABO/RH(D): A POS
Antibody Screen: NEGATIVE
Unit division: 0
Unit division: 0
Unit division: 0
Unit division: 0
Unit division: 0
Unit division: 0
Unit division: 0
Unit division: 0
Unit division: 0

## 2019-10-12 LAB — GLUCOSE, CAPILLARY
Glucose-Capillary: 148 mg/dL — ABNORMAL HIGH (ref 70–99)
Glucose-Capillary: 126 mg/dL — ABNORMAL HIGH (ref 70–99)
Glucose-Capillary: 133 mg/dL — ABNORMAL HIGH (ref 70–99)
Glucose-Capillary: 140 mg/dL — ABNORMAL HIGH (ref 70–99)
Glucose-Capillary: 154 mg/dL — ABNORMAL HIGH (ref 70–99)

## 2019-10-12 LAB — HEMOGLOBIN AND HEMATOCRIT, BLOOD
HCT: 28.8 % — ABNORMAL LOW (ref 39.0–52.0)
Hemoglobin: 9.6 g/dL — ABNORMAL LOW (ref 13.0–17.0)

## 2019-10-12 LAB — PREPARE RBC (CROSSMATCH)

## 2019-10-12 MED ORDER — SODIUM CHLORIDE 0.9% IV SOLUTION: Freq: Once | INTRAVENOUS | Status: AC

## 2019-10-12 MED ORDER — SODIUM CHLORIDE 0.9 % IV SOLN
510.0000 mg | Freq: Once | INTRAVENOUS | Status: AC
  Administered 2019-10-12: 510 mg via INTRAVENOUS
  Filled 2019-10-12: qty 17

## 2019-10-12 MED ORDER — SENNOSIDES-DOCUSATE SODIUM 8.6-50 MG PO TABS
1.0000 | ORAL_TABLET | Freq: Two times a day (BID) | ORAL | Status: DC
  Administered 2019-10-14: 1 via ORAL
  Filled 2019-10-12 (×3): qty 1

## 2019-10-12 MED ORDER — FUROSEMIDE 10 MG/ML IJ SOLN
40.0000 mg | Freq: Once | INTRAMUSCULAR | Status: AC
  Administered 2019-10-12: 40 mg via INTRAVENOUS
  Filled 2019-10-12: qty 4

## 2019-10-12 NOTE — Progress Notes (Signed)
Physical Therapy Treatment Patient Details Name: Robert Sullivan MRN: [id number] DOB: Dec 28, 1965 Today's Date: 10/12/2019    History of Present Illness 54 year old gentleman presents 10/08/19 with medical history of diabetes mellitus, arthritis bil knees, DVT, HTN, and left gluteal abscess (with numerous recent hospital admissions for irrigation and debridement) and currently with wound dehiscence for repeat I&D; additional I&D with wound closure 4/3    PT Comments    Patient just completed receiving 1 unit of blood. Eager to participate. Ambulation limited to/from bathroom due to HR elevating to 155 bpm (briefly--generally 130s most of session). Incr time due to multiple lines and pt being quite cautious/particular with how lines are managed/arranged due to fear of tripping and falling.     Follow Up Recommendations  Home health PT;Supervision - Intermittent     Equipment Recommendations  None recommended by PT    Recommendations for Other Services       Precautions / Restrictions Precautions Precautions: Fall Precaution Comments: monitor BP and HR Restrictions Weight Bearing Restrictions: Yes LLE Weight Bearing: Weight bearing as tolerated    Mobility  Bed Mobility Overal bed mobility: Modified Independent Bed Mobility: Supine to Sit;Sit to Supine     Supine to sit: Modified independent (Device/Increase time);HOB elevated Sit to supine: Modified independent (Device/Increase time);HOB elevated   General bed mobility comments: use of bed rail  Transfers Overall transfer level: Needs assistance Equipment used: None Transfers: Sit to/from Stand Sit to Stand: Min guard         General transfer comment: Supervision for safety; first time up in >24 hours and low Hgb  Ambulation/Gait Ambulation/Gait assistance: Min guard Gait Distance (Feet): 15 Feet Assistive device: Straight cane Gait Pattern/deviations: Step-through pattern;Wide base of support;Decreased stance time -  left;Decreased weight shift to left Gait velocity: decr   General Gait Details: frustrated by multiple lines, however understanding need to monitor HR (130s);    Stairs             Wheelchair Mobility    Modified Rankin (Stroke Patients Only)       Balance Overall balance assessment: Needs assistance Sitting-balance support: No upper extremity supported;Feet supported Sitting balance-Leahy Scale: Normal     Standing balance support: No upper extremity supported Standing balance-Leahy Scale: Good                              Cognition Arousal/Alertness: Awake/alert Behavior During Therapy: WFL for tasks assessed/performed Overall Cognitive Status: Within Functional Limits for tasks assessed                                        Exercises      General Comments General comments (skin integrity, edema, etc.): assisted to sit on toilet with HR up to 133 bpm; as he stood to leave bathroom, return walk increased briefly to 155 bpm      Pertinent Vitals/Pain Pain Assessment: 0-10 Pain Score: 2  Pain Location: LLE Pain Descriptors / Indicators: Discomfort Pain Intervention(s): Limited activity within patient's tolerance;Monitored during session;Repositioned    Home Living                      Prior Function            PT Goals (current goals can now be found in the care plan section) Acute Rehab PT Goals  Patient Stated Goal: To be able to walk to bathroom short term, return to independence long term PT Goal Formulation: With patient Time For Goal Achievement: 10/24/19 Potential to Achieve Goals: Good Progress towards PT goals: Progressing toward goals(goals remain appropriate post I&D)    Frequency    Min 3X/week      PT Plan Current plan remains appropriate    Co-evaluation              AM-PAC PT "6 Clicks" Mobility   Outcome Measure  Help needed turning from your back to your side while in a flat bed  without using bedrails?: None Help needed moving from lying on your back to sitting on the side of a flat bed without using bedrails?: None Help needed moving to and from a bed to a chair (including a wheelchair)?: None Help needed standing up from a chair using your arms (e.g., wheelchair or bedside chair)?: None Help needed to walk in hospital room?: A Little Help needed climbing 3-5 steps with a railing? : A Lot 6 Click Score: 21    End of Session   Activity Tolerance: Treatment limited secondary to medical complications (Comment)(elevated HR) Patient left: in bed;with call bell/phone within reach;with family/visitor present Nurse Communication: Mobility status;Other (comment)(elevated HR) PT Visit Diagnosis: Other abnormalities of gait and mobility (R26.89);Pain Pain - Right/Left: Left Pain - part of body: Hip     Time: 1191-4782 PT Time Calculation (min) (ACUTE ONLY): 38 min  Charges:  $Therapeutic Activity: 23-37 mins                      [name], PT Pager [phone]    [name] 10/12/2019, 6:13 PM

## 2019-10-12 NOTE — Progress Notes (Signed)
Patient ID: Robert Sullivan, male   DOB: October 28, 1965, 54 y.o.   MRN: [id number]         [facility]  Date of Admission:  10/08/2019   Total days of antibiotics 24        Day 3 cefepime         ASSESSMENT: Robert Sullivan is feeling better.  The Enterobacter growing in his most recent operative culture may represent simple colonization but I favor continuing cefepime for now.  He is in agreement with that plan.  PLAN: 1. Continue cefepime   Principal Problem:   Wound dehiscence Active Problems:   Type 2 diabetes mellitus without complication (HCC)   Obesity, Class III, BMI 40-49.9 (morbid obesity) (HCC)   Cirrhosis of liver with ascites (HCC)   Abscess, gluteal, left   Anemia   AKI (acute kidney injury) ([place])   Scheduled Meds: . Chlorhexidine Gluconate Cloth  6 each Topical Daily  . ferrous sulfate  325 mg Oral Q breakfast  . insulin aspart  0-15 Units Subcutaneous TID WC  . Ensure Max Protein  11 oz Oral Daily  . senna-docusate  1 tablet Oral BID  . sodium chloride flush  10-40 mL Intracatheter Q12H   Continuous Infusions: . ceFEPime (MAXIPIME) IV 2 g (10/12/19 1045)  . methocarbamol (ROBAXIN) IV     PRN Meds:.acetaminophen, bisacodyl, HYDROmorphone (DILAUDID) injection, magnesium citrate, menthol-cetylpyridinium, methocarbamol **OR** methocarbamol (ROBAXIN) IV, ondansetron **OR** ondansetron (ZOFRAN) IV, oxyCODONE, phenol, polyethylene glycol, sodium chloride flush, zolpidem   SUBJECTIVE: He says that he is feeling much better today. He has been up walking in his room today.  Review of Systems: Review of Systems  Constitutional: Negative for fever.    Allergies  Allergen Reactions  . Sulfa Antibiotics Rash and Other (See Comments)    Total body rash    OBJECTIVE: Vitals:   10/12/19 1302 10/12/19 1334 10/12/19 1335 10/12/19 1549  BP: 139/75 127/69 127/69 132/76  Pulse: 92 86 86 91  Resp: 20 19 19 16   Temp: 98.5 F (36.9 C) 98.5 F (36.9  C) 98.5 F (36.9 C) 98.5 F (36.9 C)  TempSrc: Oral  Oral Oral  SpO2: 97%  97% 97%  Weight:      Height:       Body mass index is 44.64 kg/m.  Physical Exam Constitutional:      Comments: He is in good spirits.    Musculoskeletal:     Comments: There is a moderate amount of bloody drainage in the wound VAC canister.     Lab Results Lab Results  Component Value Date   WBC 4.7 10/12/2019   HGB 7.1 (L) 10/12/2019   HCT 22.5 (L) 10/12/2019   MCV 89.3 10/12/2019   PLT 73 (L) 10/12/2019    Lab Results  Component Value Date   CREATININE 1.69 (H) 10/11/2019   BUN 32 (H) 10/11/2019   NA 134 (L) 10/11/2019   K 3.9 10/11/2019   CL 103 10/11/2019   CO2 23 10/11/2019    Lab Results  Component Value Date   ALT 551 (H) 10/11/2019   AST 995 (H) 10/11/2019   ALKPHOS 117 10/11/2019   BILITOT 2.2 (H) 10/11/2019     Microbiology: Recent Results (from the past 240 hour(s))  Respiratory Panel by RT PCR (Flu A&B, Covid) - Nasopharyngeal Swab     Status: None   Collection Time: 10/08/19 11:41 AM   Specimen: Nasopharyngeal Swab  Result Value Ref Range Status  SARS Coronavirus 2 by RT PCR NEGATIVE NEGATIVE Final    Comment: (NOTE) SARS-CoV-2 target nucleic acids are NOT DETECTED. The SARS-CoV-2 RNA is generally detectable in upper respiratoy specimens during the acute phase of infection. The lowest concentration of SARS-CoV-2 viral copies this assay can detect is 131 copies/mL. A negative result does not preclude SARS-Cov-2 infection and should not be used as the sole basis for treatment or other patient management decisions. A negative result may occur with  improper specimen collection/handling, submission of specimen other than nasopharyngeal swab, presence of viral mutation(s) within the areas targeted by this assay, and inadequate number of viral copies (<131 copies/mL). A negative result must be combined with clinical observations, patient history, and epidemiological  information. The expected result is Negative. Fact Sheet for Patients:  PinkCheek.be Fact Sheet for Healthcare Providers:  GravelBags.it This test is not yet ap proved or cleared by the [place] FDA and  has been authorized for detection and/or diagnosis of SARS-CoV-2 by FDA under an Emergency Use Authorization (EUA). This EUA will remain  in effect (meaning this test can be used) for the duration of the COVID-19 declaration under Section 564(b)(1) of the Act, 21 U.S.C. section 360bbb-3(b)(1), unless the authorization is terminated or revoked sooner.    Influenza A by PCR NEGATIVE NEGATIVE Final   Influenza B by PCR NEGATIVE NEGATIVE Final    Comment: (NOTE) The Xpert Xpress SARS-CoV-2/FLU/RSV assay is intended as an aid in  the diagnosis of influenza from Nasopharyngeal swab specimens and  should not be used as a sole basis for treatment. Nasal washings and  aspirates are unacceptable for Xpert Xpress SARS-CoV-2/FLU/RSV  testing. Fact Sheet for Patients: PinkCheek.be Fact Sheet for Healthcare Providers: GravelBags.it This test is not yet approved or cleared by the [place] FDA and  has been authorized for detection and/or diagnosis of SARS-CoV-2 by  FDA under an Emergency Use Authorization (EUA). This EUA will remain  in effect (meaning this test can be used) for the duration of the  Covid-19 declaration under Section 564(b)(1) of the Act, 21  U.S.C. section 360bbb-3(b)(1), unless the authorization is  terminated or revoked. Performed at [facility], [place] [address] [place], [place] [address]   Aerobic/Anaerobic Culture (surgical/deep wound)     Status: None (Preliminary result)   Collection Time: 10/08/19  1:20 PM   Specimen: Wound  Result Value Ref Range Status   Specimen Description WOUND  Final   Special Requests LEFT HIP  Final   Gram Stain    Final    ABUNDANT WBC PRESENT,BOTH PMN AND MONONUCLEAR NO ORGANISMS SEEN Performed at [facility], [address] [place], [place] [address]    Culture   Final    MODERATE ENTEROBACTER SPECIES NO ANAEROBES ISOLATED; CULTURE IN PROGRESS FOR 5 DAYS    Report Status PENDING  Incomplete   Organism ID, Bacteria ENTEROBACTER SPECIES  Final      Susceptibility   Enterobacter species - MIC*    CEFAZOLIN >=64 RESISTANT Resistant     CEFEPIME <=0.12 SENSITIVE Sensitive     CEFTAZIDIME <=1 SENSITIVE Sensitive     CIPROFLOXACIN <=0.25 SENSITIVE Sensitive     GENTAMICIN <=1 SENSITIVE Sensitive     IMIPENEM <=0.25 SENSITIVE Sensitive     TRIMETH/SULFA <=20 SENSITIVE Sensitive     PIP/TAZO <=4 SENSITIVE Sensitive     * MODERATE ENTEROBACTER SPECIES  Culture, blood (routine x 2)     Status: None (Preliminary result)   Collection  Time: 10/10/19  1:50 AM   Specimen: BLOOD LEFT HAND  Result Value Ref Range Status   Specimen Description BLOOD LEFT HAND  Final   Special Requests   Final    BOTTLES DRAWN AEROBIC AND ANAEROBIC Blood Culture results may not be optimal due to an excessive volume of blood received in culture bottles   Culture   Final    NO GROWTH 2 DAYS Performed at [facility], [place] [address], [place], [place] [address]    Report Status PENDING  Incomplete  Culture, blood (routine x 2)     Status: None (Preliminary result)   Collection Time: 10/10/19  2:46 AM   Specimen: BLOOD  Result Value Ref Range Status   Specimen Description BLOOD LEFT WRIST  Final   Special Requests   Final    BOTTLES DRAWN AEROBIC AND ANAEROBIC Blood Culture adequate volume   Culture   Final    NO GROWTH 2 DAYS Performed at [facility], [place] [address], [place], [place] [address]    Report Status PENDING  Incomplete  Surgical pcr screen     Status: None   Collection Time: 10/10/19  8:16 AM   Specimen: Nasal Mucosa; Nasal Swab  Result Value Ref Range Status   MRSA, PCR NEGATIVE  NEGATIVE Final   Staphylococcus aureus NEGATIVE NEGATIVE Final    Comment: (NOTE) The Xpert SA Assay (FDA approved for NASAL specimens in patients 21 years of age and older), is one component of a comprehensive surveillance program. It is not intended to diagnose infection nor to guide or monitor treatment. Performed at [facility], [place] [address], [place], [place] [address]     Michel Bickers, [place] [facility] [phone] pager   [phone] cell 10/12/2019, 5:57 PM

## 2019-10-12 NOTE — Progress Notes (Signed)
[facility] ROUNDING NOTE   Subjective:   Robert Sullivan is a 54 y.o. male.  With a history of diabetes nonalcoholic steatohepatitis or NASH cirrhosis recent hip infection with recent MSSA bacteremia.  Presented for hip I&D with abscess of left hip and possible sepsis due to wound infection.  He underwent debridement 10-11-2019. Baseline creatinine less than 1 mg/dL 10/06/2019 increased to 2.38 mg/dL 10/09/2019 and 2.56 10/10/2019.  Urine output 900 cc 10-11-2019 appears to have decompensated liver cirrhosis with a meld score of 32.  Albumin 1.8 INR 2.5 increasing LFTs  Urinalysis shows urine sodium < 10 10/10/2019 urine osmolality 320 urine sediment greater than 50 red blood cells per high-powered film 100 mg/dL protein  Blood pressure 120/68 pulse 88 temperature 98.4 O2 sats 96% room air  Appears to be pending this morning   Iron sulfate 325 mg daily insulin sliding scale Lokelma 10 g x 1 although dose appears to been held at 6.42 AM  Renal ultrasound 12.8 cm kidney right 13.3 cm kidney left no masses or hydronephrosis   Objective:  Vital signs in last 24 hours:  Temp:  [97.5 F (36.4 C)-98.5 F (36.9 C)] 98.4 F (36.9 C) (04/04 0805) Pulse Rate:  [72-99] 84 (04/04 0805) Resp:  [11-19] 15 (04/04 0805) BP: (112-146)/(64-76) 120/68 (04/04 0805) SpO2:  [95 %-99 %] 96 % (04/04 0805) Weight:  [157.7 kg] 157.7 kg (04/04 0445)  Weight change:  Filed Weights   10/08/19 1140 10/12/19 0445  Weight: (!) 148.3 kg (!) 157.7 kg    Intake/Output: I/O last 3 completed shifts: In: 68 [P.O.:240; I.V.:61; IV Piggyback:200] Out: 1935 [Urine:1300; Drains:535; Blood:100]   Intake/Output this shift:  No intake/output data recorded.  General: Alert and oriented nondistressed HEENT: Normal external appearance Eyes: Extraocular movements intact normal anicteric Neck: Supple JVP not elevated no thyromegaly Heart: Regular rate and rhythm no murmurs rubs or gallops lungs: Abdomen: Soft  nontender nondistended Extremities: Left leg with lateral thigh wound VAC Skin: No rashes lesions Neuro: Non focal cranial nerves intact   Basic Metabolic Panel: Recent Labs  Lab 10/06/19 1015 10/06/19 1015 10/09/19 1643 10/09/19 1643 10/10/19 0208 10/10/19 0831 10/11/19 0414  NA 136  --  134*  --  134* 135 134*  K 3.5  --  6.0*  --  5.9* 5.0 3.9  CL 104  --  102  --  106 106 103  CO2 25  --  14*  --  17* 21* 23  GLUCOSE 113*  --  149*  --  195* 172* 263*  BUN 15  --  26*  --  29* 31* 32*  CREATININE 0.70  --  2.38*  --  2.73* 2.56* 1.69*  CALCIUM 7.9*   < > 8.3*   < > 8.1* 7.9* 8.0*  PHOS  --   --   --   --   --   --  2.8   < > = values in this interval not displayed.    Liver Function Tests: Recent Labs  Lab 10/09/19 1643 10/10/19 0208 10/10/19 0831 10/11/19 0414  AST 449* 1,855* 1,944* 995*  ALT 178* 668* 684* 551*  ALKPHOS 91 109 105 117  BILITOT 2.8* 2.9* 2.5* 2.2*  PROT 4.6* 4.4* 4.3* 4.8*  ALBUMIN 1.9* 1.8* 1.9* 2.2*   No results for input(s): LIPASE, AMYLASE in the last 168 hours. No results for input(s): AMMONIA in the last 168 hours.  CBC: Recent Labs  Lab 10/06/19 1015 10/08/19 1816 10/09/19 1643 10/09/19  1643 10/10/19 0208 10/10/19 0831 10/10/19 2135 10/11/19 0414 10/12/19 0520  WBC 4.9   < > 27.3*  --  19.0* 10.6*  --  5.8 4.7  NEUTROABS 3.3  --   --   --   --   --   --   --  2.9  HGB 8.4*   < > 7.3*   < > 7.8* 6.1* 7.4* 7.3* 7.1*  HCT 27.2*   < > 23.0*   < > 23.6* 18.5* 22.3* 22.1* 22.5*  MCV 85.5   < > 87.8  --  87.1 86.4  --  87.7 89.3  PLT 104*   < > 323  --  167 PLATELET CLUMPS NOTED ON SMEAR, UNABLE TO ESTIMATE  --  73* 73*   < > = values in this interval not displayed.    Cardiac Enzymes: No results for input(s): CKTOTAL, CKMB, CKMBINDEX, TROPONINI in the last 168 hours.  BNP: Invalid input(s): POCBNP  CBG: Recent Labs  Lab 10/11/19 0858 10/11/19 1144 10/11/19 1622 10/12/19 0115 10/12/19 0800  GLUCAP 182* 234* 174*  148* 126*    Microbiology: Results for orders placed or performed during the hospital encounter of 10/08/19  Respiratory Panel by RT PCR (Flu A&B, Covid) - Nasopharyngeal Swab     Status: None   Collection Time: 10/08/19 11:41 AM   Specimen: Nasopharyngeal Swab  Result Value Ref Range Status   SARS Coronavirus 2 by RT PCR NEGATIVE NEGATIVE Final    Comment: (NOTE) SARS-CoV-2 target nucleic acids are NOT DETECTED. The SARS-CoV-2 RNA is generally detectable in upper respiratoy specimens during the acute phase of infection. The lowest concentration of SARS-CoV-2 viral copies this assay can detect is 131 copies/mL. A negative result does not preclude SARS-Cov-2 infection and should not be used as the sole basis for treatment or other patient management decisions. A negative result may occur with  improper specimen collection/handling, submission of specimen other than nasopharyngeal swab, presence of viral mutation(s) within the areas targeted by this assay, and inadequate number of viral copies (<131 copies/mL). A negative result must be combined with clinical observations, patient history, and epidemiological information. The expected result is Negative. Fact Sheet for Patients:  PinkCheek.be Fact Sheet for Healthcare Providers:  GravelBags.it This test is not yet ap proved or cleared by the [place] FDA and  has been authorized for detection and/or diagnosis of SARS-CoV-2 by FDA under an Emergency Use Authorization (EUA). This EUA will remain  in effect (meaning this test can be used) for the duration of the COVID-19 declaration under Section 564(b)(1) of the Act, 21 U.S.C. section 360bbb-3(b)(1), unless the authorization is terminated or revoked sooner.    Influenza A by PCR NEGATIVE NEGATIVE Final   Influenza B by PCR NEGATIVE NEGATIVE Final    Comment: (NOTE) The Xpert Xpress SARS-CoV-2/FLU/RSV assay is intended as  an aid in  the diagnosis of influenza from Nasopharyngeal swab specimens and  should not be used as a sole basis for treatment. Nasal washings and  aspirates are unacceptable for Xpert Xpress SARS-CoV-2/FLU/RSV  testing. Fact Sheet for Patients: PinkCheek.be Fact Sheet for Healthcare Providers: GravelBags.it This test is not yet approved or cleared by the [place] FDA and  has been authorized for detection and/or diagnosis of SARS-CoV-2 by  FDA under an Emergency Use Authorization (EUA). This EUA will remain  in effect (meaning this test can be used) for the duration of the  Covid-19 declaration under Section 564(b)(1) of the Act, 21  U.S.C. section 360bbb-3(b)(1), unless the authorization is  terminated or revoked. Performed at [facility], [place] [address], [place], [place] [address]   Aerobic/Anaerobic Culture (surgical/deep wound)     Status: None (Preliminary result)   Collection Time: 10/08/19  1:20 PM   Specimen: Wound  Result Value Ref Range Status   Specimen Description WOUND  Final   Special Requests LEFT HIP  Final   Gram Stain   Final    ABUNDANT WBC PRESENT,BOTH PMN AND MONONUCLEAR NO ORGANISMS SEEN Performed at [facility], [address], [place], [place] [address]    Culture   Final    MODERATE ENTEROBACTER SPECIES NO ANAEROBES ISOLATED; CULTURE IN PROGRESS FOR 5 DAYS    Report Status PENDING  Incomplete   Organism ID, Bacteria ENTEROBACTER SPECIES  Final      Susceptibility   Enterobacter species - MIC*    CEFAZOLIN >=64 RESISTANT Resistant     CEFEPIME <=0.12 SENSITIVE Sensitive     CEFTAZIDIME <=1 SENSITIVE Sensitive     CIPROFLOXACIN <=0.25 SENSITIVE Sensitive     GENTAMICIN <=1 SENSITIVE Sensitive     IMIPENEM <=0.25 SENSITIVE Sensitive     TRIMETH/SULFA <=20 SENSITIVE Sensitive     PIP/TAZO <=4 SENSITIVE Sensitive     * MODERATE ENTEROBACTER SPECIES  Culture, blood (routine x  2)     Status: None (Preliminary result)   Collection Time: 10/10/19  1:50 AM   Specimen: BLOOD LEFT HAND  Result Value Ref Range Status   Specimen Description BLOOD LEFT HAND  Final   Special Requests   Final    BOTTLES DRAWN AEROBIC AND ANAEROBIC Blood Culture results may not be optimal due to an excessive volume of blood received in culture bottles   Culture   Final    NO GROWTH 2 DAYS Performed at [facility], [place] [address] [place], [place] [address]    Report Status PENDING  Incomplete  Culture, blood (routine x 2)     Status: None (Preliminary result)   Collection Time: 10/10/19  2:46 AM   Specimen: BLOOD  Result Value Ref Range Status   Specimen Description BLOOD LEFT WRIST  Final   Special Requests   Final    BOTTLES DRAWN AEROBIC AND ANAEROBIC Blood Culture adequate volume   Culture   Final    NO GROWTH 2 DAYS Performed at [facility], [place] [address] [place], [place] [address]    Report Status PENDING  Incomplete  Surgical pcr screen     Status: None   Collection Time: 10/10/19  8:16 AM   Specimen: Nasal Mucosa; Nasal Swab  Result Value Ref Range Status   MRSA, PCR NEGATIVE NEGATIVE Final   Staphylococcus aureus NEGATIVE NEGATIVE Final    Comment: (NOTE) The Xpert SA Assay (FDA approved for NASAL specimens in patients 29 years of age and older), is one component of a comprehensive surveillance program. It is not intended to diagnose infection nor to guide or monitor treatment. Performed at [facility], [place] [address], [place],  [address]     Coagulation Studies: Recent Labs    10/09/19 1643 10/10/19 0208 10/10/19 0831 10/10/19 2135 10/11/19 0414  LABPROT 26.9* 27.2* 25.3* 23.0* 23.9*  INR 2.5* 2.5* 2.3* 2.1* 2.1*    Urinalysis: Recent Labs    10/10/19 0100  COLORURINE AMBER*  LABSPEC 1.030  PHURINE 5.0  GLUCOSEU [place]  100*  NITRITE NEGATIVE   LEUKOCYTESUR TRACE*      Imaging: US RENAL  Result Date: 10/10/2019 CLINICAL DATA:  Acute renal failure. EXAM: RENAL / URINARY TRACT ULTRASOUND COMPLETE COMPARISON:  Abdominopelvic CT 09/12/2019 FINDINGS: Right Kidney: Renal measurements: 12.8 x 5.6 x 6.5 cm = volume: 245 mL. Echogenicity within normal limits. No mass or hydronephrosis visualized. Left Kidney: Renal measurements: 13.3 x 5.9 x 4.8 cm = volume: 193 mL. Echogenicity within normal limits. No mass or hydronephrosis visualized. Bladder: Only minimally distended.  Neither ureteral jet is visualized. Other: Small-moderate volume abdominopelvic ascites. Decreased ascites from 09/12/2019 abdominal CT. IMPRESSION: 1. Unremarkable sonographic appearance of the kidneys. No obstructive uropathy. 2. Urinary bladder poorly distended, not well evaluated. 3. Incidental abdominopelvic ascites, diminished ascites from CT last month. Electronically Signed   By: Keith Rake M.D.   On: 10/10/2019 20:30     Medications:   . ceFEPime (MAXIPIME) IV 2 g (10/12/19 0216)  . methocarbamol (ROBAXIN) IV     . sodium chloride   Intravenous Once  . Chlorhexidine Gluconate Cloth  6 each Topical Daily  . docusate sodium  100 mg Oral BID  . ferrous sulfate  325 mg Oral Q breakfast  . insulin aspart  0-15 Units Subcutaneous TID WC  . Ensure Max Protein  11 oz Oral Daily  . sodium chloride flush  10-40 mL Intracatheter Q12H  . sodium zirconium cyclosilicate  10 g Oral Once  . tranexamic acid (CYKLOKAPRON) topical - INTRAOP  2,000 mg Topical Once   acetaminophen, bisacodyl, HYDROmorphone (DILAUDID) injection, magnesium citrate, menthol-cetylpyridinium, methocarbamol **OR** methocarbamol (ROBAXIN) IV, ondansetron **OR** ondansetron (ZOFRAN) IV, oxyCODONE, phenol, polyethylene glycol, sodium chloride flush, zolpidem  Assessment/ Plan:  1.Acute kidney injury there is very marginal urine output.  Urine sediment does appear to have some red blood cells and  proteinuria although the timing of this acute kidney injury appears to be in the setting of surgery.  There was a CT scan done of the abdomen and pelvis 09/12/2019 with contrast.  There is also a paracentesis that was performed 09/13/2019.  Neither of these events seem to have precipitated the acute kidney injury.  My working diagnosis that this represents some ischemic acute tubular necrosis and should improve.    No hydronephrosis on renal ultrasound.  I would continue to avoid nephrotoxins ACE inhibitor's ARB's and vancomycin.  I would also continue to avoid use of IV contrast.  It appears that there is appropriate dosing of medications.    Renal function appears to have improved.  I think this is most consistent with ATN.  We will check labs when they result. 2. Hypertension/volume  -appears to be relatively stable at this point did receive some sodium chloride boluses.    Will continue to monitor. 3.  Diabetes mellitus per primary team 4.  Hepatic cirrhosis continue supportive therapy. 5.  Poor wound healing and recurrent abscess appreciate assistance from infectious disease.  Debridement 10/11/2019 appreciate assistance of Dr. Sharol Given   LOS: [place] @TODAY @8 :48 AM

## 2019-10-12 NOTE — Progress Notes (Signed)
Robert Sullivan 9:40 AM  Subjective: Patient without any complaints and came through surgery well and is happy with his decreased drainage  Objective: Vital signs stable afebrile no acute distress abdomen is soft nontender unfortunately chemistries pending this morning  Assessment: Karlene Lineman cirrhosis with some ATN and shock liver  Plan: We will recheck labs later today and ask rounding team to check on this week please call us sooner if any specific GI question or problem otherwise happy to see back in the office as an outpatient  [facility]  office [phone] After 5PM or if no answer call [phone]

## 2019-10-12 NOTE — Progress Notes (Signed)
PROGRESS NOTE    Robert Sullivan  LNL:[id number] DOB: 1966-06-18 DOA: 10/08/2019 PCP: Cathleen Corti, PA-C   Brief Narrative:  Mr. Robert Sullivan is a 54 y.o. M with history of morbid obesity, BMI 28, type 2 diabetes mellitus, OSA not on CPAP,  NASH cirrhosis and recurrent hip infection who was admitted by orthopedics on 3/31 with wound dehiscence. -Hospitalized in early December with MSSA bacteremia, large left gluteal abscess, underwent I&D, TEE was negative for endocarditis, discharged on IV Ancef for 6 weeks, unfortunately worsening of his abscess led to readmission on 12/30, underwent repeat I&D, abscess cultures this time grew Enterobacter, then discharged on IV cefepime for plan of 4 weeks of therapy . -Subsequently developed anasarca, work-up noted possibility of cirrhosis, I was seen by [facility] on 3/4 and sent to the ED with severe volume overload, he was noted to have ascites and recurrent left gluteal abscess during this hospitalization, underwent I&D again, cultures this time grew Enterococcus, subsequently discharged on IV ampicillin, completed 3 weeks of therapy but developed wound dehiscence and readmitted 3/31 for another surgery  Assessment & Plan:  Recurrent gluteal abscess Wound dehiscence -Initially with MSSA, completed Ancef course -Subsequently grew Enterobacter and then recently grew Enterococcus -Admitted by orthopedics for wound dehiscence, underwent excisional debridement on 3/31  -Followed by irrigation and debridement with further excision of skin, soft tissue muscle and fascia, application of wound VAC by Dr. Sharol Given on 4/3  -Infectious disease consulting -Currently on cefepime, cultures from 3/31 with Enterococcus   NASH/cirrhosis Shock liver suspected LFTs elevated since 2019 and CT imaging early Mar 2021 showing stigmata of chronic liver disease.  Hep C negative, no significant EtOH, suspect NASH.   -LFTs trended up considerably 4/2, AST of 1900 and ALT of 680 -Now  improving -?  Shock liver, BP did drop to the 70-80s, earlier this admission, mostly in the 90-100 range -Trend, monitor  Acute kidney injury Hyperkalemia Acidosis, elevated gap  -Likely ATN from hypotension  -Appreciate nephrology input  -Blood pressure improving, more stable, kidney function is improving, continue to trend  Anemia of acute blood loss on anemia of chronic disease -Did receive a total of 7 units of PRBC this admission, at least 3 or 4 during surgery -Anemia panel with severe iron deficiency, iron saturation is 4 -No overt GI bleeding noted at this time, gastroenterology following -Transfuse 1 unit of PRBC and dose of Feraheme x1  Morbid obesity BMI 49  Diabetes -CBGs are stable -Continue SSI  Other medications -Hold aspirin  Moderate protein calorie malnutrition As evidenced by muscle and fat depletion, thenar wasting, chronic disease and recurrent infection and liver failure and albumin 1.8. -Consult dietitian  Chronic thrombocytopenia -Secondary to liver cirrhosis, splenic sequestration  DVT prophylaxis: SCDs due to severe thrombocytopenia Code Status: FULL Family Communication: Discussed patient in detail Disposition: Home pending stability in decompensated cirrhosis, acute on chronic anemia, ongoing management of gluteal abscess likely 48 hours   Consultants:   Orthopedics  Critical Care  ID  GI  Nephrology  Procedures:   3/31 excisional debridement by Dr. Sharol Given  4/3 PROCEDURE:  IRRIGATION AND DEBRIDEMENT HIP With further excision of skin and soft tissue muscle and fascia. 2 g of TXA topical. 1 g of TXA IV. Application of customizable.  Wound VAC. Local tissue rearrangement for wound closure 20 x 7 cm.  Antimicrobials:   Ampicillin 3/31 >> 4/1   Cefepime 4/2 >>   Culture data:   3/31 wound culture -- GNRs  4/2  blood cultures -- pending     Subjective: -Tired today, patient reports not sleeping well at all last night,  breathing is improving, appetite is fair, complains of pain at the surgical site  Objective: Vitals:   10/12/19 1142 10/12/19 1302 10/12/19 1334 10/12/19 1335  BP: (!) 144/72 139/75 127/69 127/69  Pulse: 83 92 86 86  Resp: 15 20 19 19   Temp: 98.8 F (37.1 C) 98.5 F (36.9 C) 98.5 F (36.9 C) 98.5 F (36.9 C)  TempSrc: Oral Oral  Oral  SpO2: 97% 97%  97%  Weight:      Height:        Intake/Output Summary (Last 24 hours) at 10/12/2019 1413 Last data filed at 10/12/2019 1045 Gross per 24 hour  Intake 441 ml  Output 1165 ml  Net -724 ml   Filed Weights   10/08/19 1140 10/12/19 0445  Weight: (!) 148.3 kg (!) 157.7 kg    Examination: Gen: Obese chronically ill appearing Caucasian male, sitting up in bed, AAOx3, HEENT: Obese, unable to assess JVD, positive icterus Lungs: Distant breath sounds CVS: S1-S2, regular rate rhythm Abd: Soft, obese, nontender, nondistended, do not appreciate a fluid thrill Extremities: Trace edema, dressing over his left Skin: As above   Data Reviewed: I have personally reviewed following labs and imaging studies:  CBC: Recent Labs  Lab 10/06/19 1015 10/08/19 1816 10/09/19 1643 10/09/19 1643 10/10/19 0208 10/10/19 0831 10/10/19 2135 10/11/19 0414 10/12/19 0520  WBC 4.9   < > 27.3*  --  19.0* 10.6*  --  5.8 4.7  NEUTROABS 3.3  --   --   --   --   --   --   --  2.9  HGB 8.4*   < > 7.3*   < > 7.8* 6.1* 7.4* 7.3* 7.1*  HCT 27.2*   < > 23.0*   < > 23.6* 18.5* 22.3* 22.1* 22.5*  MCV 85.5   < > 87.8  --  87.1 86.4  --  87.7 89.3  PLT 104*   < > 323  --  167 PLATELET CLUMPS NOTED ON SMEAR, UNABLE TO ESTIMATE  --  73* 73*   < > = values in this interval not displayed.   Basic Metabolic Panel: Recent Labs  Lab 10/06/19 1015 10/09/19 1643 10/10/19 0208 10/10/19 0831 10/11/19 0414  NA 136 134* 134* 135 134*  K 3.5 6.0* 5.9* 5.0 3.9  CL 104 102 106 106 103  CO2 25 14* 17* 21* 23  GLUCOSE 113* 149* 195* 172* 263*  BUN 15 26* 29* 31* 32*    CREATININE 0.70 2.38* 2.73* 2.56* 1.69*  CALCIUM 7.9* 8.3* 8.1* 7.9* 8.0*  PHOS  --   --   --   --  2.8   GFR: Estimated Creatinine Clearance: 81.3 mL/min (A) (by C-G formula based on SCr of 1.69 mg/dL (H)). Liver Function Tests: Recent Labs  Lab 10/09/19 1643 10/10/19 0208 10/10/19 0831 10/11/19 0414  AST 449* 1,855* 1,944* 995*  ALT 178* 668* 684* 551*  ALKPHOS 91 109 105 117  BILITOT 2.8* 2.9* 2.5* 2.2*  PROT 4.6* 4.4* 4.3* 4.8*  ALBUMIN 1.9* 1.8* 1.9* 2.2*   No results for input(s): LIPASE, AMYLASE in the last 168 hours. No results for input(s): AMMONIA in the last 168 hours. Coagulation Profile: Recent Labs  Lab 10/09/19 1643 10/10/19 0208 10/10/19 0831 10/10/19 2135 10/11/19 0414  INR 2.5* 2.5* 2.3* 2.1* 2.1*   Cardiac Enzymes: No results for input(s): CKTOTAL, CKMB, CKMBINDEX,  TROPONINI in the last 168 hours. BNP (last 3 results) No results for input(s): PROBNP in the last 8760 hours. HbA1C: No results for input(s): HGBA1C in the last 72 hours. CBG: Recent Labs  Lab 10/11/19 1144 10/11/19 1622 10/12/19 0115 10/12/19 0800 10/12/19 1155  GLUCAP 234* 174* 148* 126* 133*   Lipid Profile: No results for input(s): CHOL, HDL, LDLCALC, TRIG, CHOLHDL, LDLDIRECT in the last 72 hours. Thyroid Function Tests: No results for input(s): TSH, T4TOTAL, FREET4, T3FREE, THYROIDAB in the last 72 hours. Anemia Panel: Recent Labs    10/11/19 1701  VITAMINB12 1,775*  FOLATE 7.2  FERRITIN 113  TIBC 217*  IRON 9*  RETICCTPCT 4.9*   Urine analysis:    Component Value Date/Time   COLORURINE AMBER (A) 10/10/2019 0100   APPEARANCEUR CLOUDY (A) 10/10/2019 0100   LABSPEC 1.030 10/10/2019 0100   PHURINE 5.0 10/10/2019 0100   GLUCOSEU 50 (A) 10/10/2019 0100   HGBUR LARGE (A) 10/10/2019 0100   BILIRUBINUR NEGATIVE 10/10/2019 0100   KETONESUR NEGATIVE 10/10/2019 0100   PROTEINUR 100 (A) 10/10/2019 0100   NITRITE NEGATIVE 10/10/2019 0100   LEUKOCYTESUR TRACE (A)  10/10/2019 0100   Sepsis Labs: @LABRCNTIP (procalcitonin:4,lacticacidven:4)  ) Recent Results (from the past 240 hour(s))  Respiratory Panel by RT PCR (Flu A&B, Covid) - Nasopharyngeal Swab     Status: None   Collection Time: 10/08/19 11:41 AM   Specimen: Nasopharyngeal Swab  Result Value Ref Range Status   SARS Coronavirus 2 by RT PCR NEGATIVE NEGATIVE Final    Comment: (NOTE) SARS-CoV-2 target nucleic acids are NOT DETECTED. The SARS-CoV-2 RNA is generally detectable in upper respiratoy specimens during the acute phase of infection. The lowest concentration of SARS-CoV-2 viral copies this assay can detect is 131 copies/mL. A negative result does not preclude SARS-Cov-2 infection and should not be used as the sole basis for treatment or other patient management decisions. A negative result may occur with  improper specimen collection/handling, submission of specimen other than nasopharyngeal swab, presence of viral mutation(s) within the areas targeted by this assay, and inadequate number of viral copies (<131 copies/mL). A negative result must be combined with clinical observations, patient history, and epidemiological information. The expected result is Negative. Fact Sheet for Patients:  PinkCheek.be Fact Sheet for Healthcare Providers:  GravelBags.it This test is not yet ap proved or cleared by the [place] FDA and  has been authorized for detection and/or diagnosis of SARS-CoV-2 by FDA under an Emergency Use Authorization (EUA). This EUA will remain  in effect (meaning this test can be used) for the duration of the COVID-19 declaration under Section 564(b)(1) of the Act, 21 U.S.C. section 360bbb-3(b)(1), unless the authorization is terminated or revoked sooner.    Influenza A by PCR NEGATIVE NEGATIVE Final   Influenza B by PCR NEGATIVE NEGATIVE Final    Comment: (NOTE) The Xpert Xpress SARS-CoV-2/FLU/RSV assay  is intended as an aid in  the diagnosis of influenza from Nasopharyngeal swab specimens and  should not be used as a sole basis for treatment. Nasal washings and  aspirates are unacceptable for Xpert Xpress SARS-CoV-2/FLU/RSV  testing. Fact Sheet for Patients: PinkCheek.be Fact Sheet for Healthcare Providers: GravelBags.it This test is not yet approved or cleared by the [place] FDA and  has been authorized for detection and/or diagnosis of SARS-CoV-2 by  FDA under an Emergency Use Authorization (EUA). This EUA will remain  in effect (meaning this test can be used) for the duration of the  Covid-19 declaration  under Section 564(b)(1) of the Act, 21  U.S.C. section 360bbb-3(b)(1), unless the authorization is  terminated or revoked. Performed at [facility], [place] [address] [place], [place] [address]   Aerobic/Anaerobic Culture (surgical/deep wound)     Status: None (Preliminary result)   Collection Time: 10/08/19  1:20 PM   Specimen: Wound  Result Value Ref Range Status   Specimen Description WOUND  Final   Special Requests LEFT HIP  Final   Gram Stain   Final    ABUNDANT WBC PRESENT,BOTH PMN AND MONONUCLEAR NO ORGANISMS SEEN Performed at [facility], [address] [place], [place] [address]    Culture   Final    MODERATE ENTEROBACTER SPECIES NO ANAEROBES ISOLATED; CULTURE IN PROGRESS FOR 5 DAYS    Report Status PENDING  Incomplete   Organism ID, Bacteria ENTEROBACTER SPECIES  Final      Susceptibility   Enterobacter species - MIC*    CEFAZOLIN >=64 RESISTANT Resistant     CEFEPIME <=0.12 SENSITIVE Sensitive     CEFTAZIDIME <=1 SENSITIVE Sensitive     CIPROFLOXACIN <=0.25 SENSITIVE Sensitive     GENTAMICIN <=1 SENSITIVE Sensitive     IMIPENEM <=0.25 SENSITIVE Sensitive     TRIMETH/SULFA <=20 SENSITIVE Sensitive     PIP/TAZO <=4 SENSITIVE Sensitive     * MODERATE ENTEROBACTER SPECIES  Culture,  blood (routine x 2)     Status: None (Preliminary result)   Collection Time: 10/10/19  1:50 AM   Specimen: BLOOD LEFT HAND  Result Value Ref Range Status   Specimen Description BLOOD LEFT HAND  Final   Special Requests   Final    BOTTLES DRAWN AEROBIC AND ANAEROBIC Blood Culture results may not be optimal due to an excessive volume of blood received in culture bottles   Culture   Final    NO GROWTH 2 DAYS Performed at [facility], [place] [address] [place], [place] [address]    Report Status PENDING  Incomplete  Culture, blood (routine x 2)     Status: None (Preliminary result)   Collection Time: 10/10/19  2:46 AM   Specimen: BLOOD  Result Value Ref Range Status   Specimen Description BLOOD LEFT WRIST  Final   Special Requests   Final    BOTTLES DRAWN AEROBIC AND ANAEROBIC Blood Culture adequate volume   Culture   Final    NO GROWTH 2 DAYS Performed at [facility], [place] [address] [place], [place] [address]    Report Status PENDING  Incomplete  Surgical pcr screen     Status: None   Collection Time: 10/10/19  8:16 AM   Specimen: Nasal Mucosa; Nasal Swab  Result Value Ref Range Status   MRSA, PCR NEGATIVE NEGATIVE Final   Staphylococcus aureus NEGATIVE NEGATIVE Final    Comment: (NOTE) The Xpert SA Assay (FDA approved for NASAL specimens in patients 41 years of age and older), is one component of a comprehensive surveillance program. It is not intended to diagnose infection nor to guide or monitor treatment. Performed at [facility], [place] [address] [place], [place] [address]          Radiology Studies: US RENAL  Result Date: 10/10/2019 CLINICAL DATA:  Acute renal failure. EXAM: RENAL / URINARY TRACT ULTRASOUND COMPLETE COMPARISON:  Abdominopelvic CT 09/12/2019 FINDINGS: Right Kidney: Renal measurements: 12.8 x 5.6 x 6.5 cm = volume: 245 mL. Echogenicity within normal limits. No mass or hydronephrosis visualized. Left Kidney:  Renal measurements: 13.3 x  5.9 x 4.8 cm = volume: 193 mL. Echogenicity within normal limits. No mass or hydronephrosis visualized. Bladder: Only minimally distended.  Neither ureteral jet is visualized. Other: Small-moderate volume abdominopelvic ascites. Decreased ascites from 09/12/2019 abdominal CT. IMPRESSION: 1. Unremarkable sonographic appearance of the kidneys. No obstructive uropathy. 2. Urinary bladder poorly distended, not well evaluated. 3. Incidental abdominopelvic ascites, diminished ascites from CT last month. Electronically Signed   By: Keith Rake M.D.   On: 10/10/2019 20:30        Scheduled Meds: . Chlorhexidine Gluconate Cloth  6 each Topical Daily  . ferrous sulfate  325 mg Oral Q breakfast  . furosemide  40 mg Intravenous Once  . insulin aspart  0-15 Units Subcutaneous TID WC  . Ensure Max Protein  11 oz Oral Daily  . senna-docusate  1 tablet Oral BID  . sodium chloride flush  10-40 mL Intracatheter Q12H   Continuous Infusions: . ceFEPime (MAXIPIME) IV 2 g (10/12/19 1045)  . methocarbamol (ROBAXIN) IV       LOS: 4 days    Time spent: 35 minutes  Domenic Polite, MD [facility] 10/12/2019, 2:13 PM

## 2019-10-13 LAB — CBC WITH DIFFERENTIAL/PLATELET
Abs Immature Granulocytes: 0.09 10*3/uL — ABNORMAL HIGH (ref 0.00–0.07)
Basophils Absolute: 0.1 10*3/uL (ref 0.0–0.1)
Basophils Relative: 2 %
Eosinophils Absolute: 0.5 10*3/uL (ref 0.0–0.5)
Eosinophils Relative: 8 %
HCT: 28.6 % — ABNORMAL LOW (ref 39.0–52.0)
Hemoglobin: 9.2 g/dL — ABNORMAL LOW (ref 13.0–17.0)
Immature Granulocytes: 1 %
Lymphocytes Relative: 20 %
Lymphs Abs: 1.3 10*3/uL (ref 0.7–4.0)
MCH: 29.2 pg (ref 26.0–34.0)
MCHC: 32.2 g/dL (ref 30.0–36.0)
MCV: 90.8 fL (ref 80.0–100.0)
Monocytes Absolute: 0.8 10*3/uL (ref 0.1–1.0)
Monocytes Relative: 13 %
Neutro Abs: 3.7 10*3/uL (ref 1.7–7.7)
Neutrophils Relative %: 56 %
Platelets: 94 10*3/uL — ABNORMAL LOW (ref 150–400)
RBC: 3.15 MIL/uL — ABNORMAL LOW (ref 4.22–5.81)
RDW: 17.8 % — ABNORMAL HIGH (ref 11.5–15.5)
WBC: 6.5 10*3/uL (ref 4.0–10.5)
nRBC: 0 % (ref 0.0–0.2)

## 2019-10-13 LAB — COMPREHENSIVE METABOLIC PANEL
ALT: 280 U/L — ABNORMAL HIGH (ref 0–44)
AST: 369 U/L — ABNORMAL HIGH (ref 15–41)
Albumin: 2.3 g/dL — ABNORMAL LOW (ref 3.5–5.0)
Alkaline Phosphatase: 138 U/L — ABNORMAL HIGH (ref 38–126)
Anion gap: 8 (ref 5–15)
BUN: 27 mg/dL — ABNORMAL HIGH (ref 6–20)
CO2: 24 mmol/L (ref 22–32)
Calcium: 8.2 mg/dL — ABNORMAL LOW (ref 8.9–10.3)
Chloride: 104 mmol/L (ref 98–111)
Creatinine, Ser: 0.99 mg/dL (ref 0.61–1.24)
GFR calc Af Amer: >60 mL/min (ref >60)
GFR calc non Af Amer: >60 mL/min (ref >60)
Glucose, Bld: 139 mg/dL — ABNORMAL HIGH (ref 70–99)
Potassium: 3.6 mmol/L (ref 3.5–5.1)
Sodium: 136 mmol/L (ref 135–145)
Total Bilirubin: 1.5 mg/dL — ABNORMAL HIGH (ref 0.3–1.2)
Total Protein: 5.5 g/dL — ABNORMAL LOW (ref 6.5–8.1)

## 2019-10-13 LAB — AEROBIC/ANAEROBIC CULTURE W GRAM STAIN (SURGICAL/DEEP WOUND)

## 2019-10-13 LAB — BPAM RBC
Blood Product Expiration Date: 202104302359
ISSUE DATE / TIME: 202104041309
Unit Type and Rh: 6200

## 2019-10-13 LAB — GLUCOSE, CAPILLARY
Glucose-Capillary: 167 mg/dL — ABNORMAL HIGH (ref 70–99)
Glucose-Capillary: 150 mg/dL — ABNORMAL HIGH (ref 70–99)
Glucose-Capillary: 144 mg/dL — ABNORMAL HIGH (ref 70–99)

## 2019-10-13 LAB — TYPE AND SCREEN
ABO/RH(D): A POS
Antibody Screen: NEGATIVE
Unit division: 0

## 2019-10-13 MED ORDER — SPIRONOLACTONE 25 MG PO TABS
50.0000 mg | ORAL_TABLET | Freq: Every day | ORAL | Status: DC
  Administered 2019-10-13 – 2019-10-14 (×2): 50 mg via ORAL
  Filled 2019-10-13 (×2): qty 2

## 2019-10-13 MED ORDER — CIPROFLOXACIN HCL 500 MG PO TABS
500.0000 mg | ORAL_TABLET | Freq: Two times a day (BID) | ORAL | Status: DC
  Administered 2019-10-13 – 2019-10-14 (×3): 500 mg via ORAL
  Filled 2019-10-13 (×3): qty 1

## 2019-10-13 MED ORDER — FUROSEMIDE 20 MG PO TABS
20.0000 mg | ORAL_TABLET | Freq: Every day | ORAL | Status: DC
  Administered 2019-10-13 – 2019-10-14 (×2): 20 mg via ORAL
  Filled 2019-10-13 (×2): qty 1

## 2019-10-13 NOTE — Progress Notes (Signed)
PROGRESS NOTE    Zahari Xiang  SUP:[id number] DOB: [birth year] DOA: 10/08/2019 PCP: Cathleen Corti, PA-C   Brief Narrative:  Mr. Mcglocklin is a 54 y.o. M with history of morbid obesity, BMI 73, type 2 diabetes mellitus, OSA not on CPAP,  NASH cirrhosis and recurrent hip infection who was admitted by orthopedics on 3/31 with wound dehiscence. -Hospitalized in early December with MSSA bacteremia, large left gluteal abscess, underwent I&D, TEE was negative for endocarditis, discharged on IV Ancef for 6 weeks, unfortunately worsening of his abscess led to readmission on 12/30, underwent repeat I&D, abscess cultures this time grew Enterobacter, then discharged on IV cefepime for plan of 4 weeks of therapy . -Subsequently developed anasarca, work-up noted possibility of cirrhosis, I was seen by [facility] on 3/4 and sent to the ED with severe volume overload, he was noted to have ascites and recurrent left gluteal abscess during this hospitalization, underwent I&D again, cultures this time grew Enterococcus, subsequently discharged on IV ampicillin, completed 3 weeks of therapy but developed wound dehiscence and readmitted 3/31 for another surgery  Assessment & Plan:  Recurrent gluteal abscess Wound dehiscence -Initially with MSSA, completed Ancef course -Subsequently grew Enterobacter and then recently grew Enterococcus -Admitted by orthopedics for wound dehiscence, underwent excisional debridement on 3/31  -Followed by irrigation and debridement with further excision of skin, soft tissue muscle and fascia, application of wound VAC by Dr. Sharol Given on 4/3  -Infectious disease consulting -Currently on cefepime, cultures from 3/31 with Enterococcus, duration per ID   NASH/cirrhosis Shock liver suspected LFTs elevated since 2019 and CT imaging early Mar 2021 showing stigmata of chronic liver disease.  Hep C negative, no significant EtOH, suspect NASH.   -LFTs trended up considerably 4/2, AST of 1900 and ALT  of 680 -felt to be secondary to shock liver as his blood pressure did drop earlier this admission -Now improving -Restart Lasix and Aldactone at low-dose, monitor  Acute kidney injury Hyperkalemia Acidosis, elevated gap  -Likely ATN from hypotension  -Appreciate nephrology input  -Blood pressure improving, more stable, kidney function is improving  Anemia of acute blood loss on anemia of chronic disease -Did receive a total of 7-8 units of PRBC this admission, at least 3 or 4 during surgery -Anemia panel with severe iron deficiency, iron saturation is 4 -No overt GI bleeding noted at this time, gastroenterology following -Hemoglobin improved now, given IV Feraheme 4/4 -Monitor  Morbid obesity BMI 49  Diabetes -CBGs are stable -Continue SSI  Other medications -Hold aspirin  Moderate protein calorie malnutrition As evidenced by muscle and fat depletion, thenar wasting, chronic disease and recurrent infection and liver failure and albumin 1.8. -Consult dietitian  Chronic thrombocytopenia -Secondary to liver cirrhosis, splenic sequestration  DVT prophylaxis: SCDs due to severe thrombocytopenia Code Status: FULL Family Communication: Discussed patient in detail Disposition: Home in 1 to 2 days pending stability in anemia, LFTs   Consultants:   Orthopedics  Critical Care  ID  GI  Nephrology  Procedures:   3/31 excisional debridement by Dr. Sharol Given  4/3: PROCEDURE:  IRRIGATION AND DEBRIDEMENT HIP With further excision of skin and soft tissue muscle and fascia. 2 g of TXA topical. 1 g of TXA IV. Application of customizable.  Wound VAC. Local tissue rearrangement for wound closure 20 x 7 cm.  Antimicrobials:   Ampicillin 3/31 >> 4/1   Cefepime 4/2 >>   Culture data:   3/31 wound culture -- GNRs  4/2 blood cultures -- pending  Subjective: -Feels better, no events overnight, oral intake improving, moving a little in the room  Objective: Vitals:    10/12/19 2353 10/13/19 0352 10/13/19 0809 10/13/19 1138  BP:   (!) 133/95 140/85  Pulse:   (!) 128 100  Resp:   17 18  Temp: 98.3 F (36.8 C) 98 F (36.7 C) 98.6 F (37 C) 97.7 F (36.5 C)  TempSrc: Oral Oral Oral   SpO2:   97% 94%  Weight:      Height:        Intake/Output Summary (Last 24 hours) at 10/13/2019 1149 Last data filed at 10/12/2019 2304 Gross per 24 hour  Intake 986.83 ml  Output 965 ml  Net 21.83 ml   Filed Weights   10/08/19 1140 10/12/19 0445  Weight: (!) 148.3 kg (!) 157.7 kg    Examination: Gen: Obese chronically ill appearing Caucasian male, sitting up in bed, awake alert oriented x3 HEENT: Obese, unable to assess JVD Lungs: Diminished breath sounds at both bases CVS: RRR,No Gallops,Rubs or new Murmurs Abd: soft, Non tender, non distended, BS present Extremities: Trace edema, dressing over his left lateral upper thigh and buttocks Skin: As above   Data Reviewed: I have personally reviewed following labs and imaging studies:  CBC: Recent Labs  Lab 10/10/19 0208 10/10/19 0208 10/10/19 0831 10/10/19 0831 10/10/19 2135 10/11/19 0414 10/12/19 0520 10/12/19 1758 10/13/19 0620  WBC 19.0*  --  10.6*  --   --  5.8 4.7  --  6.5  NEUTROABS  --   --   --   --   --   --  2.9  --  3.7  HGB 7.8*   < > 6.1*   < > 7.4* 7.3* 7.1* 9.6* 9.2*  HCT 23.6*   < > 18.5*   < > 22.3* 22.1* 22.5* 28.8* 28.6*  MCV 87.1  --  86.4  --   --  87.7 89.3  --  90.8  PLT 167  --  PLATELET CLUMPS NOTED ON SMEAR, UNABLE TO ESTIMATE  --   --  73* 73*  --  94*   < > = values in this interval not displayed.   Basic Metabolic Panel: Recent Labs  Lab 10/09/19 1643 10/10/19 0208 10/10/19 0831 10/11/19 0414 10/13/19 0620  NA 134* 134* 135 134* 136  K 6.0* 5.9* 5.0 3.9 3.6  CL 102 106 106 103 104  CO2 14* 17* 21* 23 24  GLUCOSE 149* 195* 172* 263* 139*  BUN 26* 29* 31* 32* 27*  CREATININE 2.38* 2.73* 2.56* 1.69* 0.99  CALCIUM 8.3* 8.1* 7.9* 8.0* 8.2*  PHOS  --   --   --   2.8  --    GFR: Estimated Creatinine Clearance: 138.8 mL/min (by C-G formula based on SCr of 0.99 mg/dL). Liver Function Tests: Recent Labs  Lab 10/09/19 1643 10/10/19 0208 10/10/19 0831 10/11/19 0414 10/13/19 0620  AST 449* 1,855* 1,944* 995* 369*  ALT 178* 668* 684* 551* 280*  ALKPHOS 91 109 105 117 138*  BILITOT 2.8* 2.9* 2.5* 2.2* 1.5*  PROT 4.6* 4.4* 4.3* 4.8* 5.5*  ALBUMIN 1.9* 1.8* 1.9* 2.2* 2.3*   No results for input(s): LIPASE, AMYLASE in the last 168 hours. No results for input(s): AMMONIA in the last 168 hours. Coagulation Profile: Recent Labs  Lab 10/09/19 1643 10/10/19 0208 10/10/19 0831 10/10/19 2135 10/11/19 0414  INR 2.5* 2.5* 2.3* 2.1* 2.1*   Cardiac Enzymes: No results for input(s): CKTOTAL, CKMB, CKMBINDEX, TROPONINI in  the last 168 hours. BNP (last 3 results) No results for input(s): PROBNP in the last 8760 hours. HbA1C: No results for input(s): HGBA1C in the last 72 hours. CBG: Recent Labs  Lab 10/12/19 1155 10/12/19 1625 10/12/19 2121 10/13/19 0813 10/13/19 1138  GLUCAP 133* 140* 154* 167* 150*   Lipid Profile: No results for input(s): CHOL, HDL, LDLCALC, TRIG, CHOLHDL, LDLDIRECT in the last 72 hours. Thyroid Function Tests: No results for input(s): TSH, T4TOTAL, FREET4, T3FREE, THYROIDAB in the last 72 hours. Anemia Panel: Recent Labs    10/11/19 1701  VITAMINB12 1,775*  FOLATE 7.2  FERRITIN 113  TIBC 217*  IRON 9*  RETICCTPCT 4.9*   Urine analysis:    Component Value Date/Time   COLORURINE AMBER (A) 10/10/2019 0100   APPEARANCEUR CLOUDY (A) 10/10/2019 0100   LABSPEC 1.030 10/10/2019 0100   PHURINE 5.0 10/10/2019 0100   GLUCOSEU 50 (A) 10/10/2019 0100   HGBUR LARGE (A) 10/10/2019 0100   BILIRUBINUR NEGATIVE 10/10/2019 0100   KETONESUR NEGATIVE 10/10/2019 0100   PROTEINUR 100 (A) 10/10/2019 0100   NITRITE NEGATIVE 10/10/2019 0100   LEUKOCYTESUR TRACE (A) 10/10/2019 0100   Sepsis  Labs: @LABRCNTIP (procalcitonin:4,lacticacidven:4)  ) Recent Results (from the past 240 hour(s))  Respiratory Panel by RT PCR (Flu A&B, Covid) - Nasopharyngeal Swab     Status: None   Collection Time: 10/08/19 11:41 AM   Specimen: Nasopharyngeal Swab  Result Value Ref Range Status   SARS Coronavirus 2 by RT PCR NEGATIVE NEGATIVE Final    Comment: (NOTE) SARS-CoV-2 target nucleic acids are NOT DETECTED. The SARS-CoV-2 RNA is generally detectable in upper respiratoy specimens during the acute phase of infection. The lowest concentration of SARS-CoV-2 viral copies this assay can detect is 131 copies/mL. A negative result does not preclude SARS-Cov-2 infection and should not be used as the sole basis for treatment or other patient management decisions. A negative result may occur with  improper specimen collection/handling, submission of specimen other than nasopharyngeal swab, presence of viral mutation(s) within the areas targeted by this assay, and inadequate number of viral copies (<131 copies/mL). A negative result must be combined with clinical observations, patient history, and epidemiological information. The expected result is Negative. Fact Sheet for Patients:  PinkCheek.be Fact Sheet for Healthcare Providers:  GravelBags.it This test is not yet ap proved or cleared by the [place] FDA and  has been authorized for detection and/or diagnosis of SARS-CoV-2 by FDA under an Emergency Use Authorization (EUA). This EUA will remain  in effect (meaning this test can be used) for the duration of the COVID-19 declaration under Section 564(b)(1) of the Act, 21 U.S.C. section 360bbb-3(b)(1), unless the authorization is terminated or revoked sooner.    Influenza A by PCR NEGATIVE NEGATIVE Final   Influenza B by PCR NEGATIVE NEGATIVE Final    Comment: (NOTE) The Xpert Xpress SARS-CoV-2/FLU/RSV assay is intended as an aid in   the diagnosis of influenza from Nasopharyngeal swab specimens and  should not be used as a sole basis for treatment. Nasal washings and  aspirates are unacceptable for Xpert Xpress SARS-CoV-2/FLU/RSV  testing. Fact Sheet for Patients: PinkCheek.be Fact Sheet for Healthcare Providers: GravelBags.it This test is not yet approved or cleared by the [place] FDA and  has been authorized for detection and/or diagnosis of SARS-CoV-2 by  FDA under an Emergency Use Authorization (EUA). This EUA will remain  in effect (meaning this test can be used) for the duration of the  Covid-19 declaration under Section  564(b)(1) of the Act, 21  U.S.C. section 360bbb-3(b)(1), unless the authorization is  terminated or revoked. Performed at [facility], [place] [address] [place], [place] [address]   Aerobic/Anaerobic Culture (surgical/deep wound)     Status: None (Preliminary result)   Collection Time: 10/08/19  1:20 PM   Specimen: Wound  Result Value Ref Range Status   Specimen Description WOUND  Final   Special Requests LEFT HIP  Final   Gram Stain   Final    ABUNDANT WBC PRESENT,BOTH PMN AND MONONUCLEAR NO ORGANISMS SEEN Performed at [facility], [address] [place], [place] [address]    Culture   Final    MODERATE ENTEROBACTER SPECIES NO ANAEROBES ISOLATED; CULTURE IN PROGRESS FOR 5 DAYS    Report Status PENDING  Incomplete   Organism ID, Bacteria ENTEROBACTER SPECIES  Final      Susceptibility   Enterobacter species - MIC*    CEFAZOLIN >=64 RESISTANT Resistant     CEFEPIME <=0.12 SENSITIVE Sensitive     CEFTAZIDIME <=1 SENSITIVE Sensitive     CIPROFLOXACIN <=0.25 SENSITIVE Sensitive     GENTAMICIN <=1 SENSITIVE Sensitive     IMIPENEM <=0.25 SENSITIVE Sensitive     TRIMETH/SULFA <=20 SENSITIVE Sensitive     PIP/TAZO <=4 SENSITIVE Sensitive     * MODERATE ENTEROBACTER SPECIES  Culture, blood (routine x 2)      Status: None (Preliminary result)   Collection Time: 10/10/19  1:50 AM   Specimen: BLOOD LEFT HAND  Result Value Ref Range Status   Specimen Description BLOOD LEFT HAND  Final   Special Requests   Final    BOTTLES DRAWN AEROBIC AND ANAEROBIC Blood Culture results may not be optimal due to an excessive volume of blood received in culture bottles   Culture   Final    NO GROWTH 3 DAYS Performed at [facility], [place] [address], [place], [place] [address]    Report Status PENDING  Incomplete  Culture, blood (routine x 2)     Status: None (Preliminary result)   Collection Time: 10/10/19  2:46 AM   Specimen: BLOOD  Result Value Ref Range Status   Specimen Description BLOOD LEFT WRIST  Final   Special Requests   Final    BOTTLES DRAWN AEROBIC AND ANAEROBIC Blood Culture adequate volume   Culture   Final    NO GROWTH 3 DAYS Performed at [facility], [place] [address], [place], [place] [address]    Report Status PENDING  Incomplete  Surgical pcr screen     Status: None   Collection Time: 10/10/19  8:16 AM   Specimen: Nasal Mucosa; Nasal Swab  Result Value Ref Range Status   MRSA, PCR NEGATIVE NEGATIVE Final   Staphylococcus aureus NEGATIVE NEGATIVE Final    Comment: (NOTE) The Xpert SA Assay (FDA approved for NASAL specimens in patients 87 years of age and older), is one component of a comprehensive surveillance program. It is not intended to diagnose infection nor to guide or monitor treatment. Performed at [facility], [place] [address] [place], [place] [address]          Radiology Studies: No results found.      Scheduled Meds: . Chlorhexidine Gluconate Cloth  6 each Topical Daily  . ciprofloxacin  500 mg Oral BID  . ferrous sulfate  325 mg Oral Q breakfast  . furosemide  20 mg Oral Daily  . insulin aspart  0-15 Units  Subcutaneous TID WC  . Ensure Max Protein  11 oz Oral Daily  . senna-docusate  1 tablet Oral BID  . sodium chloride flush  10-40 mL  Intracatheter Q12H  . spironolactone  50 mg Oral Daily   Continuous Infusions: . methocarbamol (ROBAXIN) IV       LOS: 5 days    Time spent: 35 minutes  Domenic Polite, MD [facility] 10/13/2019, 11:49 AM

## 2019-10-13 NOTE — Progress Notes (Signed)
PT Cancellation Note  Patient Details Name: Robert Sullivan MRN: [id number] DOB: [birth year]   Cancelled Treatment:    Reason Eval/Treat Not Completed: Fatigue/lethargy limiting ability to participate  Patient reports recent dilaudid to help him sleep as he is "exhausted from not sleeping for days." Feels he is too groggy to be safe to walk or especially attempt stairs at this time and politely refused.   He requested PT come as early as possible 4/6 as he hopes to d/c home.    [name], PT Pager [phone]  [name] 10/13/2019, 4:29 PM

## 2019-10-13 NOTE — Progress Notes (Signed)
Nephrology brief note: Patient is nonoliguric and renal function has improved. Sign off, please call back with question.  Katheran James, MD [facility].

## 2019-10-13 NOTE — Progress Notes (Signed)
Patient ID: Robert Sullivan, male   DOB: 18-Oct-1965, 54 y.o.   MRN: [id number] Patient feels good this morning he was able to ambulate with therapy yesterday and will try stairs today.  Only 325 cc in the wound VAC canister since surgery.  There is a good suction fit.  Patient's laboratory values show significant improvement  BUN and creatinine have improved significantly.  27/0.99.  Significant improvement in AST and ALT.  369/280.  Significant improvement in white blood cell count 6.5.  Hemoglobin stable 9.2.  Platelets still low at 94.

## 2019-10-13 NOTE — Progress Notes (Signed)
[facility]enterology Progress Note  Robert Sullivan 54 y.o. 06/13/1966  CC: Cirrhosis, abnormal LFTs  Subjective: Seen and examined at bedside.  No acute GI issues.  Last bowel movement yesterday.  Mild abdominal distention.  Denies nausea and vomiting.  ROS : Afebrile.  Negative for chest pain   Objective: Vital signs in last 24 hours: Vitals:   10/13/19 0352 10/13/19 0809  BP:  (!) 133/95  Pulse:  (!) 128  Resp:  17  Temp: 98 F (36.7 C) 98.6 F (37 C)  SpO2:  97%    Physical Exam:  General:  Alert, cooperative, no distress, appears stated age  Head:  Normocephalic, without obvious abnormality, atraumatic  Eyes:    Lungs:     Heart:  Regular rate and rhythm, S1, S2 normal  Abdomen:    Mild distended, obese abdomen, nontender, bowel sounds present  Psych   mood and affect normal  Neuro  alert/oriented x3    Lab Results: Recent Labs    10/11/19 0414 10/13/19 0620  NA 134* 136  K 3.9 3.6  CL 103 104  CO2 23 24  GLUCOSE 263* 139*  BUN 32* 27*  CREATININE 1.69* 0.99  CALCIUM 8.0* 8.2*  PHOS 2.8  --    Recent Labs    10/11/19 0414 10/13/19 0620  AST 995* 369*  ALT 551* 280*  ALKPHOS 117 138*  BILITOT 2.2* 1.5*  PROT 4.8* 5.5*  ALBUMIN 2.2* 2.3*   Recent Labs    10/12/19 0520 10/12/19 0520 10/12/19 1758 10/13/19 0620  WBC 4.7  --   --  6.5  NEUTROABS 2.9  --   --  3.7  HGB 7.1*   < > 9.6* 9.2*  HCT 22.5*   < > 28.8* 28.6*  MCV 89.3  --   --  90.8  PLT 73*  --   --  94*   < > = values in this interval not displayed.   Recent Labs    10/10/19 2135 10/11/19 0414  LABPROT 23.0* 23.9*  INR 2.1* 2.1*      Assessment/Plan: -Abnormal LFTs.  Improving.  Combination of shock liver in setting of cirrhosis. -Cirrhosis.  Most likely from [place]. -Recurrent gluteal abscess with wound dehiscence -Acute kidney injury.  Improved  Recommendations ----------------------- -Patient's LFTs improving. -No further inpatient GI work-up planned -GI will  sign off.  Follow-up with Dr. Watt Climes  in 4 to 6 weeks after discharge for further management of cirrhosis   Otis Brace MD, FACP 10/13/2019, 8:57 AM  Contact #  [phone]

## 2019-10-13 NOTE — Progress Notes (Signed)
Patient ID: Robert Sullivan, male   DOB: 1965/12/29, 54 y.o.   MRN: [id number]         [facility] for Infectious Disease  Date of Admission:  10/08/2019   Total days of antibiotics 25        Day 4 cefepime         ASSESSMENT: Robert Sullivan is feeling better.  It is impossible to know if the Enterobacter grown from his left hip wound is a simple colonizer or contributor to his poor wound healing.  I discussed management options with Robert Sullivan and his wife.  I will change cefepime to oral ciprofloxacin.  He can have his PICC removed before discharge.  PLAN: 1. Change cefepime to oral ciprofloxacin for 3 weeks 2. Remove PICC before discharge 3. Follow-up with me in clinic on 10/30/2019  Principal Problem:   Wound dehiscence Active Problems:   Type 2 diabetes mellitus without complication (HCC)   Obesity, Class III, BMI 40-49.9 (morbid obesity) (HCC)   Cirrhosis of liver with ascites (HCC)   Abscess, gluteal, left   Anemia   AKI (acute kidney injury) ([place])   Scheduled Meds: . Chlorhexidine Gluconate Cloth  6 each Topical Daily  . ciprofloxacin  500 mg Oral BID  . ferrous sulfate  325 mg Oral Q breakfast  . furosemide  20 mg Oral Daily  . insulin aspart  0-15 Units Subcutaneous TID WC  . Ensure Max Protein  11 oz Oral Daily  . senna-docusate  1 tablet Oral BID  . sodium chloride flush  10-40 mL Intracatheter Q12H  . spironolactone  50 mg Oral Daily   Continuous Infusions: . methocarbamol (ROBAXIN) IV     PRN Meds:.acetaminophen, bisacodyl, HYDROmorphone (DILAUDID) injection, menthol-cetylpyridinium, methocarbamol **OR** methocarbamol (ROBAXIN) IV, ondansetron **OR** ondansetron (ZOFRAN) IV, oxyCODONE, phenol, polyethylene glycol, sodium chloride flush, zolpidem   SUBJECTIVE: He says that he is feeling much better today. He has been up walking in his room today.  Review of Systems: Review of Systems  Constitutional: Negative for fever.    Allergies  Allergen Reactions  .  Sulfa Antibiotics Rash and Other (See Comments)    Total body rash    OBJECTIVE: Vitals:   10/12/19 2353 10/13/19 0352 10/13/19 0809 10/13/19 1138  BP:   (!) 133/95 140/85  Pulse:   (!) 128 100  Resp:   17 18  Temp: 98.3 F (36.8 C) 98 F (36.7 C) 98.6 F (37 C) 97.7 F (36.5 C)  TempSrc: Oral Oral Oral   SpO2:   97% 94%  Weight:      Height:       Body mass index is 44.64 kg/m.  Physical Exam Constitutional:      Comments: He is in good spirits.    Musculoskeletal:     Comments: There is a moderate amount of bloody drainage in the wound VAC canister.     Lab Results Lab Results  Component Value Date   WBC 6.5 10/13/2019   HGB 9.2 (L) 10/13/2019   HCT 28.6 (L) 10/13/2019   MCV 90.8 10/13/2019   PLT 94 (L) 10/13/2019    Lab Results  Component Value Date   CREATININE 0.99 10/13/2019   BUN 27 (H) 10/13/2019   NA 136 10/13/2019   K 3.6 10/13/2019   CL 104 10/13/2019   CO2 24 10/13/2019    Lab Results  Component Value Date   ALT 280 (H) 10/13/2019   AST 369 (H) 10/13/2019   ALKPHOS 138 (H)  10/13/2019   BILITOT 1.5 (H) 10/13/2019     Microbiology: Recent Results (from the past 240 hour(s))  Respiratory Panel by RT PCR (Flu A&B, Covid) - Nasopharyngeal Swab     Status: None   Collection Time: 10/08/19 11:41 AM   Specimen: Nasopharyngeal Swab  Result Value Ref Range Status   SARS Coronavirus 2 by RT PCR NEGATIVE NEGATIVE Final    Comment: (NOTE) SARS-CoV-2 target nucleic acids are NOT DETECTED. The SARS-CoV-2 RNA is generally detectable in upper respiratoy specimens during the acute phase of infection. The lowest concentration of SARS-CoV-2 viral copies this assay can detect is 131 copies/mL. A negative result does not preclude SARS-Cov-2 infection and should not be used as the sole basis for treatment or other patient management decisions. A negative result may occur with  improper specimen collection/handling, submission of specimen other than  nasopharyngeal swab, presence of viral mutation(s) within the areas targeted by this assay, and inadequate number of viral copies (<131 copies/mL). A negative result must be combined with clinical observations, patient history, and epidemiological information. The expected result is Negative. Fact Sheet for Patients:  PinkCheek.be Fact Sheet for Healthcare Providers:  GravelBags.it This test is not yet ap proved or cleared by the [place] FDA and  has been authorized for detection and/or diagnosis of SARS-CoV-2 by FDA under an Emergency Use Authorization (EUA). This EUA will remain  in effect (meaning this test can be used) for the duration of the COVID-19 declaration under Section 564(b)(1) of the Act, 21 U.S.C. section 360bbb-3(b)(1), unless the authorization is terminated or revoked sooner.    Influenza A by PCR NEGATIVE NEGATIVE Final   Influenza B by PCR NEGATIVE NEGATIVE Final    Comment: (NOTE) The Xpert Xpress SARS-CoV-2/FLU/RSV assay is intended as an aid in  the diagnosis of influenza from Nasopharyngeal swab specimens and  should not be used as a sole basis for treatment. Nasal washings and  aspirates are unacceptable for Xpert Xpress SARS-CoV-2/FLU/RSV  testing. Fact Sheet for Patients: PinkCheek.be Fact Sheet for Healthcare Providers: GravelBags.it This test is not yet approved or cleared by the [place] FDA and  has been authorized for detection and/or diagnosis of SARS-CoV-2 by  FDA under an Emergency Use Authorization (EUA). This EUA will remain  in effect (meaning this test can be used) for the duration of the  Covid-19 declaration under Section 564(b)(1) of the Act, 21  U.S.C. section 360bbb-3(b)(1), unless the authorization is  terminated or revoked. Performed at [facility], [place] [address], [place], [place] [address]     Aerobic/Anaerobic Culture (surgical/deep wound)     Status: None   Collection Time: 10/08/19  1:20 PM   Specimen: Wound  Result Value Ref Range Status   Specimen Description WOUND  Final   Special Requests LEFT HIP  Final   Gram Stain   Final    ABUNDANT WBC PRESENT,BOTH PMN AND MONONUCLEAR NO ORGANISMS SEEN    Culture   Final    MODERATE ENTEROBACTER SPECIES NO ANAEROBES ISOLATED Performed at [facility], [place] [address] [place], [place] [address]    Report Status 10/13/2019 FINAL  Final   Organism ID, Bacteria ENTEROBACTER SPECIES  Final      Susceptibility   Enterobacter species - MIC*    CEFAZOLIN >=64 RESISTANT Resistant     CEFEPIME <=0.12 SENSITIVE Sensitive     CEFTAZIDIME <=1 SENSITIVE Sensitive     CIPROFLOXACIN <=0.25 SENSITIVE Sensitive     GENTAMICIN <=1  SENSITIVE Sensitive     IMIPENEM <=0.25 SENSITIVE Sensitive     TRIMETH/SULFA <=20 SENSITIVE Sensitive     PIP/TAZO <=4 SENSITIVE Sensitive     * MODERATE ENTEROBACTER SPECIES  Culture, blood (routine x 2)     Status: None (Preliminary result)   Collection Time: 10/10/19  1:50 AM   Specimen: BLOOD LEFT HAND  Result Value Ref Range Status   Specimen Description BLOOD LEFT HAND  Final   Special Requests   Final    BOTTLES DRAWN AEROBIC AND ANAEROBIC Blood Culture results may not be optimal due to an excessive volume of blood received in culture bottles   Culture   Final    NO GROWTH 3 DAYS Performed at [facility], [place] [address], [place], [place] [address]    Report Status PENDING  Incomplete  Culture, blood (routine x 2)     Status: None (Preliminary result)   Collection Time: 10/10/19  2:46 AM   Specimen: BLOOD  Result Value Ref Range Status   Specimen Description BLOOD LEFT WRIST  Final   Special Requests   Final    BOTTLES DRAWN AEROBIC AND ANAEROBIC Blood Culture adequate volume   Culture   Final    NO GROWTH 3 DAYS Performed at [facility], [place] [address] [place], [place]  [address]    Report Status PENDING  Incomplete  Surgical pcr screen     Status: None   Collection Time: 10/10/19  8:16 AM   Specimen: Nasal Mucosa; Nasal Swab  Result Value Ref Range Status   MRSA, PCR NEGATIVE NEGATIVE Final   Staphylococcus aureus NEGATIVE NEGATIVE Final    Comment: (NOTE) The Xpert SA Assay (FDA approved for NASAL specimens in patients 64 years of age and older), is one component of a comprehensive surveillance program. It is not intended to diagnose infection nor to guide or monitor treatment. Performed at [facility], [place] [address], [place], [place] [address]     Michel Bickers, [place] for [facility] [phone] pager   [phone] cell 10/13/2019, 12:27 PM

## 2019-10-13 NOTE — Progress Notes (Signed)
OT Cancellation Note  Patient Details Name: Robert Sullivan MRN: [id number] DOB: October 14, 1965   Cancelled Treatment:    Reason Eval/Treat Not Completed: Patient declined, no reason specified(Pt reporting he is tired and would like to rest.)Will return as schedule allows.   [place], OTR/L Acute Rehab Pager: [phone] Office: [phone] 10/13/2019, 4:35 PM

## 2019-10-14 LAB — CBC
HCT: 28.9 % — ABNORMAL LOW (ref 39.0–52.0)
Hemoglobin: 9.3 g/dL — ABNORMAL LOW (ref 13.0–17.0)
MCH: 29.2 pg (ref 26.0–34.0)
MCHC: 32.2 g/dL (ref 30.0–36.0)
MCV: 90.6 fL (ref 80.0–100.0)
Platelets: 98 10*3/uL — ABNORMAL LOW (ref 150–400)
RBC: 3.19 MIL/uL — ABNORMAL LOW (ref 4.22–5.81)
RDW: 17.8 % — ABNORMAL HIGH (ref 11.5–15.5)
WBC: 8.5 10*3/uL (ref 4.0–10.5)
nRBC: 0 % (ref 0.0–0.2)

## 2019-10-14 LAB — COMPREHENSIVE METABOLIC PANEL
ALT: 206 U/L — ABNORMAL HIGH (ref 0–44)
AST: 195 U/L — ABNORMAL HIGH (ref 15–41)
Albumin: 2.3 g/dL — ABNORMAL LOW (ref 3.5–5.0)
Alkaline Phosphatase: 136 U/L — ABNORMAL HIGH (ref 38–126)
Anion gap: 8 (ref 5–15)
BUN: 29 mg/dL — ABNORMAL HIGH (ref 6–20)
CO2: 24 mmol/L (ref 22–32)
Calcium: 8.4 mg/dL — ABNORMAL LOW (ref 8.9–10.3)
Chloride: 102 mmol/L (ref 98–111)
Creatinine, Ser: 0.95 mg/dL (ref 0.61–1.24)
GFR calc Af Amer: >60 mL/min (ref >60)
GFR calc non Af Amer: >60 mL/min (ref >60)
Glucose, Bld: 160 mg/dL — ABNORMAL HIGH (ref 70–99)
Potassium: 3.8 mmol/L (ref 3.5–5.1)
Sodium: 134 mmol/L — ABNORMAL LOW (ref 135–145)
Total Bilirubin: 1.2 mg/dL (ref 0.3–1.2)
Total Protein: 5.6 g/dL — ABNORMAL LOW (ref 6.5–8.1)

## 2019-10-14 LAB — GLUCOSE, CAPILLARY
Glucose-Capillary: 150 mg/dL — ABNORMAL HIGH (ref 70–99)
Glucose-Capillary: 169 mg/dL — ABNORMAL HIGH (ref 70–99)
Glucose-Capillary: 199 mg/dL — ABNORMAL HIGH (ref 70–99)

## 2019-10-14 MED ORDER — POLYETHYLENE GLYCOL 3350 17 G PO PACK: 17.0000 g | PACK | Freq: Every day | ORAL | 0 refills | Status: AC | PRN

## 2019-10-14 MED ORDER — POTASSIUM CHLORIDE CRYS ER 20 MEQ PO TBCR: 20.0000 meq | EXTENDED_RELEASE_TABLET | Freq: Every day | ORAL | 0 refills | Status: AC

## 2019-10-14 MED ORDER — CIPROFLOXACIN HCL 500 MG PO TABS: 500.0000 mg | ORAL_TABLET | Freq: Two times a day (BID) | ORAL | 0 refills | Status: AC

## 2019-10-14 MED ORDER — OXYCODONE HCL 5 MG PO TABS: 5.0000 mg | ORAL_TABLET | Freq: Four times a day (QID) | ORAL | 0 refills | Status: DC | PRN

## 2019-10-14 MED ORDER — METHOCARBAMOL 500 MG PO TABS: 500.0000 mg | ORAL_TABLET | Freq: Four times a day (QID) | ORAL | 0 refills | Status: AC | PRN

## 2019-10-14 MED ORDER — INSULIN GLARGINE 100 UNIT/ML ~~LOC~~ SOLN: 15.0000 [IU] | Freq: Every day | SUBCUTANEOUS | Status: AC | PRN

## 2019-10-14 NOTE — Progress Notes (Addendum)
Pt. Educated on Prevanna Portable wound vac ordered and provided by RN case Freight forwarder. Charged unit and patient performed return demo as to set up. Pt. Concerned about having enough canisters. Discussed seeing how much he is draining in 24 hours and calling provider to see if he needs more. This was agreeable. Switched to portable just prior to discharge. Wound vac dressing intact, not leaking, seal intact on large would vac. Pt. Has taken care of his wound vac before and feels comfortable. Told to call provider with any questions and reviewed AVS. Son to pick him up. Simmie Davies RN  [phone]: Left with son, taken down in [facility] to car. All belongings including laptop, phone, chargers to both, Prevana pump set up on patient and extra supplies sent home. Pt demonstrated understanding of AVS/wound vac and when to call provider. Simmie Davies RN

## 2019-10-14 NOTE — Progress Notes (Signed)
Patient ID: Robert Sullivan, male   DOB: Sep 08, 1965, 54 y.o.   MRN: [id number] Patient did not participate with physical therapy or occupational therapy yesterday.  Anticipate therapy today with discharge to home today.  Patient will need the portable Praveena wound VAC pump plus spare canisters for discharge.  The dressing has a good suction fit this morning with a total of 450 cc in the wound VAC canister since surgery.  Anticipate discharge to home on Cipro I will follow-up in the office in 1 week.

## 2019-10-14 NOTE — Discharge Instructions (Signed)
Negative Pressure Wound Therapy Home Guide Negative pressure wound therapy (NPWT) uses a sponge or foam-like material (dressing) placed on or inside the wound. The wound is then covered and sealed with a cover dressing that sticks to your skin (is adhesive). This keeps air out. A tube is attached to the cover dressing, and this tube connects to a small pump. The pump sucks fluid and germs from the wound. NPWT helps to increase blood flow to the wound and heal it from the inside. What are the risks? NPWT is usually safe to use. However, problems can occur, including:  Skin irritation from the dressing adhesive.  Bleeding.  Infection.  Dehydration. Wounds with large amounts of drainage can cause excessive fluid loss.  Pain. Supplies needed:  A disposable garbage bag.  Soap and water, or hand sanitizer.  Wound cleanser or salt-water solution (saline).  New sponge and cover dressing.  Protective clothing.  Gauze pad.  Vinyl gloves.  Tape.  Skin protectant. This may be a wipe, film, or spray.  Clean or germ-free (sterile) scissors.  Eye protection. How to change your dressing Prepare to change your dressing  1. If told by your health care provider, take pain medicine 30 minutes before changing the dressing. 2. Wash your hands with soap and water. Dry your hands with a clean towel. If soap and water are not available, use hand sanitizer. 3. Set up a clean station for wound care. 4. Open the dressing package so that the sponge dressing remains on the inside of the package. 5. Wear gloves, protective clothing, and eye protection. Remove old dressing  1. Turn off the pump and disconnect the tubing from the dressing. 2. Carefully remove the adhesive cover dressing in the direction of your hair growth. 3. Remove the sponge dressing that is inside the wound. If the sponge sticks, use a wound cleanser or saline solution to wet the sponge and help it come off more easily. 4. Throw  the old sponge and cover dressing supplies into the garbage bag. 5. Remove your gloves by grabbing the cuff and turning the glove inside out. Place the gloves in the trash immediately. 6. Wash your hands with soap and water. Dry your hands with a clean towel. If soap and water are not available, use hand sanitizer. Clean your wound  Wear gloves, protective clothing, and eye protection. Follow your health care provider's instructions on how to clean your wound. You may be told to: 1. Clean the wound using a saline solution or a wound cleanser and a clean gauze pad. 2. Pat the wound dry with a gauze pad. Do not rub the wound. 3. Throw the gauze pad into the garbage bag. 4. Remove your gloves by grabbing the cuff and turning the glove inside out. Place the gloves in the trash immediately. 5. Wash your hands with soap and water. Dry your hands with a clean towel. If soap and water are not available, use hand sanitizer. Apply new dressing  Wear gloves, protective clothing, and eye protection. 1. If told by your health care provider, apply a skin protectant to any skin that will be exposed to adhesive. Let the skin protectant dry. 2. Cut a piece of new sponge dressing and put it on or in the wound. 3. Using clean scissors, cut a nickel-sized hole in the new cover dressing. 4. Apply the cover dressing. 5. Attach the suction tube over the hole in the cover dressing. 6. Take off your gloves. Put them in the  plastic bag with the old dressing. Tie the bag shut and throw it away. 7. Wash your hands with soap and water. Dry your hands with a clean towel. If soap and water are not available, use hand sanitizer. 8. Turn the pump back on. The sponge dressing should collapse. Do not change the settings on the machine without talking to a health care provider. 9. Replace the container in the pump that collects fluid if it is full. Replace the container per the manufacturer's instructions or at least once a  week, even if it is not full. General tips and recommendations If the alarm sounds:  Stay calm.  Do not turn off the pump or do anything with the dressing.  Reasons the alarm may go off: ? The battery is low. Change the battery or plug the device into electrical power. ? The dressing has a leak. Find the leak and put tape over the leak. ? The fluid collection container is full. Change the fluid container.  Call your health care provider right away if you cannot fix the problem.  Explain to your health care provider what is happening. Follow his or her instructions. General instructions  Do not turn off the pump unless told to do so by your health care provider.  Do not turn off the pump for more than 2 hours. If the pump is off for more than 2 hours, the dressing will need to be changed.  If your health care provider says it is okay to shower: ? Do not take the pump into the shower. ? Make sure the wound dressing is protected and sealed. The wound dressing must stay dry.  Check frequently that the machine indicates that therapy is on and that all clamps are open.  Do not use over-the-counter medicated or antiseptic creams, sprays, liquids, or dressings unless your health care provider approves. Contact a health care provider if:  You have new pain.  You develop irritation, a rash, or itching around the wound or dressing.  You see new black or yellow tissue in your wound.  The dressing changes are painful or cause bleeding.  The pump has been off for more than 2 hours, and you do not know how to change the dressing.  The pump alarm goes off, and you do not know what to do. Get help right away if:  You have a lot of bleeding.  The wound breaks open.  You have severe pain.  You have signs of infection, such as: ? More redness, swelling, or pain. ? More fluid or blood. ? Warmth. ? Pus or a bad smell. ? Red streaks leading from the wound. ? A fever.  You see a  sudden change in the color or texture of the drainage.  You have signs of dehydration, such as: ? Little or no tears, urine, or sweat. ? Muscle cramps. ? Very dry mouth. ? Headache. ? Dizziness. Summary  Negative pressure wound therapy (NPWT) is a device that helps your wound heal.  Set up a clean station for wound care. Your health care provider will tell you what supplies to use.  Follow your health care provider's instructions on how to clean your wound and how to change the dressing.  Contact a health care provider if you have new pain, an irritation, or a rash, or if the alarm goes off and you do not know what to do.  Get help right away if you have a lot of bleeding, your wound breaks  open, or you have severe pain. Also, get help if you have signs of infection. This information is not intended to replace advice given to you by your health care provider. Make sure you discuss any questions you have with your health care provider. Document Revised: 10/18/2018 Document Reviewed: 09/13/2018 Elsevier Patient Education  [place].

## 2019-10-14 NOTE — TOC Transition Note (Signed)
Transition of Care (TOC) - CM/SW Discharge Note Marvetta Gibbons RN,BSN Transitions of Care Unit 4NP (non trauma) - RN Case Manager [phone] Camreigh Michie RN,BSN Transitions of Care Unit 4NP (non trauma) - RN Case Manager [phone]   Patient Details  Name: Robert Sullivan MRN: [id number] Date of Birth: Nov 15, 1965  Transition of Care [facility]) CM/SW Contact:  Dawayne Patricia, RN Phone Number: 10/14/2019, 12:27 PM   Clinical Narrative:    Pt stable for transition home today, per Dr. Sharol Given pt will need a prevena wound VAC and extra canisters for home. Unit to request prevena VAC and transition pt to portable pump prior to discharge ([facility] has in their supply closet along with extra canisters).  Orders placed for HHRN/PT- call made to [facility] with [place] agency this week regarding referral for charity [facility] needs- after review of pt- received call back from [place] with pt will not meet charity guidelines due to income level being too high. Spoke with pt at bedside- per pt he has been paying out of pocket for his IV abx needs and is already aware that his income level is too high for charity HH. Per conversation with pt he does not feel like he will need HHRN/PT services and does not desire to pay out of pocket for them. Pt will have prevena wound vac which will not require drsg changes and will f/u with Dr. Sharol Given next week. Pt states he has everything he needs at home. Wife to provide transport this evening. [place] services declined by pt at this time.    Final next level of care: [place] Barriers to Discharge: No Barriers Identified   Patient Goals and CMS Choice Patient states their goals for this hospitalization and ongoing recovery are:: return home and get better CMS Medicare.gov Compare Post Acute Care list provided to:: Patient Choice offered to / list presented to : NA(no insurance will be referred to charity agency)  Discharge Placement                 Home      Discharge Plan and Services   Discharge Planning Services: CM Consult Post Acute Care Choice: Home Health, Durable Medical Equipment          DME Arranged: Vac(Prevena plus wound VAC- with extra canister- ordered from hospital supply)         HH Arranged: RN, PT [place] Agency: [place] Date [place]: 10/14/19 Time [place]: 1130 Representative spoke with at [place]: [place] ([place]) Interventions     Readmission Risk Interventions Readmission Risk Prevention Plan 10/14/2019 09/16/2019  Transportation Screening Complete Complete  PCP or Specialist Appt within 3-5 Days Complete Complete  HRI or Home Care Consult Complete Patient refused  Social Work Consult for [place] Planning/Counseling Patient refused Patient refused  Palliative Care Screening Not Applicable Not Applicable  Medication Review Press photographer) Complete Complete

## 2019-10-14 NOTE — Progress Notes (Signed)
Physical Therapy Treatment Patient Details Name: Robert Sullivan MRN: [id number] DOB: 1966-02-01 Today's Date: 10/14/2019    History of Present Illness 54 year old gentleman presents 10/08/19 with medical history of diabetes mellitus, arthritis bil knees, DVT, HTN, and left gluteal abscess (with numerous recent hospital admissions for irrigation and debridement) and currently with wound dehiscence for repeat I&D; additional I&D with wound closure 4/3    PT Comments    Pt presenting with generalized deconditioning and decreased acitivity tolerance compared to PTA.Pt frustrated with the constant set back stating "I was walking and doing stairs without a cane or any help." pt was able to amb 56' with RW however 3/4 on DOE scale and HR increased to 150sbpm. Discussed HEP. Pt aware of what he needs to do to rebuild his strength as he's done this a couple times already. Acute PT to cont to follow.    Follow Up Recommendations  No PT follow up;Supervision/Assistance - 24 hour     Equipment Recommendations  None recommended by PT    Recommendations for Other Services       Precautions / Restrictions Precautions Precautions: Fall Precaution Comments: monitor HR Restrictions Weight Bearing Restrictions: Yes LLE Weight Bearing: Weight bearing as tolerated    Mobility  Bed Mobility Overal bed mobility: Modified Independent Bed Mobility: Supine to Sit     Supine to sit: Modified independent (Device/Increase time);HOB elevated     General bed mobility comments: use of bedrail, HOB elevated, increased time  Transfers Overall transfer level: Needs assistance Equipment used: Rolling walker (2 wheeled) Transfers: Sit to/from Stand Sit to Stand: Supervision         General transfer comment: increased time, supervision for safety, no physical assist needed  Ambulation/Gait Ambulation/Gait assistance: Min guard Gait Distance (Feet): 75 Feet Assistive device: Rolling walker (2  wheeled) Gait Pattern/deviations: Step-through pattern;Wide base of support;Decreased stance time - left;Decreased weight shift to left Gait velocity: dec Gait velocity interpretation: <1.31 ft/sec, indicative of household ambulator General Gait Details: HR increased to 150bpm, +SOB, no episode of LOB however pt with poor activity tolerance   Stairs Stairs: Yes Stairs assistance: Min assist Stair Management: With cane(HHA on the Left) Number of Stairs: 4 General stair comments: pt with 4STE with no HR, step to pattern, verbal cues for sequencing with cane and keeping it with the L LE, "up with the good, down with the bad"   Wheelchair Mobility    Modified Rankin (Stroke Patients Only)       Balance Overall balance assessment: Needs assistance Sitting-balance support: No upper extremity supported;Feet supported Sitting balance-Leahy Scale: Normal     Standing balance support: No upper extremity supported Standing balance-Leahy Scale: Good Standing balance comment: static standing pt is good however pt benefits from RW for amb                            Cognition Arousal/Alertness: Awake/alert Behavior During Therapy: [facility] for tasks assessed/performed Overall Cognitive Status: Within Functional Limits for tasks assessed                                 General Comments: pt with depressed mood due to frequent set backs however willing to work with therapy      Exercises      General Comments General comments (skin integrity, edema, etc.): HR increasd to 150s but immeadiately came down once sitting,  Pertinent Vitals/Pain Pain Assessment: Faces Faces Pain Scale: Hurts a little bit Pain Location: L LE Pain Descriptors / Indicators: Discomfort Pain Intervention(s): Monitored during session    Home Living                      Prior Function            PT Goals (current goals can now be found in the care plan section) Acute  Rehab PT Goals Patient Stated Goal: get back to how he was doing Progress towards PT goals: Progressing toward goals    Frequency    Min 3X/week      PT Plan Current plan remains appropriate    Co-evaluation PT/OT/SLP Co-Evaluation/Treatment: Yes Reason for Co-Treatment: Other (comment)(poor activity tolerance) PT goals addressed during session: Mobility/safety with mobility OT goals addressed during session: ADL's and self-care      AM-PAC PT "6 Clicks" Mobility   Outcome Measure  Help needed turning from your back to your side while in a flat bed without using bedrails?: None Help needed moving from lying on your back to sitting on the side of a flat bed without using bedrails?: None Help needed moving to and from a bed to a chair (including a wheelchair)?: None Help needed standing up from a chair using your arms (e.g., wheelchair or bedside chair)?: None Help needed to walk in hospital room?: A Little Help needed climbing 3-5 steps with a railing? : A Little 6 Click Score: 22    End of Session Equipment Utilized During Treatment: Gait belt Activity Tolerance: Patient tolerated treatment well Patient left: in chair;with call bell/phone within reach;with chair alarm set Nurse Communication: Mobility status;Other (comment) PT Visit Diagnosis: Other abnormalities of gait and mobility (R26.89);Pain Pain - Right/Left: Left Pain - part of body: Hip     Time: 8867-7373 PT Time Calculation (min) (ACUTE ONLY): 29 min  Charges:  $Gait Training: 8-22 mins $Therapeutic Activity: 8-22 mins                     Kittie Plater, PT, DPT Acute Rehabilitation Services Pager #: [phone] Office #: [phone]    Berline Lopes 10/14/2019, 12:13 PM

## 2019-10-14 NOTE — Discharge Summary (Signed)
Physician Discharge Summary  Robert Sullivan JHE:[id number] DOB: [birth year] DOA: 10/08/2019  PCP: Cathleen Corti, PA-C  Admit date: 10/08/2019 Discharge date: 10/14/2019  Time spent: 35 minutes  Recommendations for Outpatient Follow-up:  Orthopedics Dr. Sharol Given in 1 week, for wound check, wound VAC eval etc. Infectious disease Dr. Megan Salon on 4/22 PCP in 1 week  Discharge Diagnoses:  Principal Problem:   Wound dehiscence Recurrent gluteal abscess NASH/cirrhosis Acute kidney injury Hyperkalemia Metabolic acidosis Acute on chronic anemia Anemia of chronic disease Morbid obesity Severe protein calorie malnutrition   Type 2 diabetes mellitus without complication (HCC)   Obesity, Class III, BMI 40-49.9 (morbid obesity) ([place])   Cirrhosis of liver with ascites (HCC)   Abscess, gluteal, left   Anemia   AKI (acute kidney injury) ([place])   Discharge Condition: Stable  Diet recommendation: Diabetic, heart  Filed Weights   10/08/19 1140 10/12/19 0445 10/14/19 0500  Weight: (!) 148.3 kg (!) 157.7 kg (!) 157.7 kg    History of present illness:  Robert Sullivan is a 54 y.o. M with history of morbid obesity, BMI 49, type 2 diabetes mellitus, OSA not on CPAP,  NASH cirrhosis and recurrent hip infection who was admitted by orthopedics on 3/31 with wound dehiscence. -Hospitalized in early December with MSSA bacteremia, large left gluteal abscess, underwent I&D, TEE was negative for endocarditis, discharged on IV Ancef for 6 weeks, unfortunately worsening of his abscess led to readmission on 12/30, underwent repeat I&D, abscess cultures this time grew Enterobacter, then discharged on IV cefepime for plan of 4 weeks of therapy . -Subsequently developed anasarca, work-up noted possibility of cirrhosis, I was seen by [facility] on 3/4 and sent to the ED with severe volume overload, he was noted to have ascites and recurrent left gluteal abscess during this hospitalization, underwent I&D again, cultures this  time grew Enterococcus, subsequently discharged on IV ampicillin, completed 3 weeks of therapy but developed wound dehiscence and readmitted 3/31 for another surgery  Hospital Course:   Recurrent gluteal abscess Wound dehiscence -History of recurrent gluteal abscesses back in December, cultures grew MSSA, completed Ancef course, subsequently grew Enterobacter and then recently grew Enterococcus -Now admitted by orthopedics for wound dehiscence, underwent excisional debridement on 3/31  -Followed by irrigation and debridement with further excision of skin, soft tissue muscle and fascia, application of wound VAC by Dr. Sharol Given on 4/3  -Infectious disease consulted -Treated with IV cefepime inpatient, or cultures cultures from 3/31 with Enterococcus, Dr. Megan Salon recommended 3 weeks of oral ciprofloxacin to be completed on 4/24 -Patient is advised to follow-up with Dr. Sharol Given next week and Dr. Megan Salon on 4/22   NASH/cirrhosis Shock liver suspected LFTs elevated since 2019 and CT imaging early Mar 2021 showing stigmata of chronic liver disease.  Hep C negative, no significant EtOH, suspect NASH.   -LFTs trended up considerably  on 4/2 with, AST of 1900 and ALT of 680 -This was felt to be secondary to shock liver as his blood pressure did drop earlier this admission -Now improving -Restarted Lasix and Aldactone  Acute kidney injury Hyperkalemia Acidosis, elevated gap  -Likely ATN from hypotension  -Seen by nephrology in consultation, subsequently had improvement in stabilization in his kidney function which has now normalized -Lasix and Aldactone resumed  Anemia of acute blood loss on anemia of chronic disease -Did receive a total of 7-8 units of PRBC this admission, at least 3 or 4 during surgery -Anemia panel with severe iron deficiency, iron saturation is 4 -No overt  GI bleeding noted at this time, gastroenterology following -Hemoglobin improved now, given IV Feraheme 4/4 -Resume oral  iron at discharge  Morbid obesity BMI 49  Diabetes Mellitus -CBGs are stable -takes Insulin PRN  Moderate protein calorie malnutrition -as evidenced by muscle and fat depletion, thenar wasting, chronic disease and recurrent infection and liver failure and albumin 1.8. -Supplements as tolerated, most of his hypoalbuminemia is driven by cirrhosis  Chronic thrombocytopenia -Secondary to liver cirrhosis, splenic sequestration  Discharge Exam: Vitals:   10/14/19 0359 10/14/19 0820  BP:  126/79  Pulse:  (!) 102  Resp:  (!) 22  Temp: 98 F (36.7 C) 98.2 F (36.8 C)  SpO2:  97%    General: AAOx3 Cardiovascular: S1S2/RRR Respiratory: CTAB  Discharge Instructions   Discharge Instructions    Diet - low sodium heart healthy   Complete by: As directed    Diet Carb Modified   Complete by: As directed    Increase activity slowly   Complete by: As directed    Negative Pressure Wound Therapy - Incisional   Complete by: As directed    Please change to prevena pump and show patient how to attach. Give patient 3 extra prevena canisters ( can be obtained from 5N or 6 N     Allergies as of 10/14/2019      Reactions   Sulfa Antibiotics Rash, Other (See Comments)   Total body rash      Medication List    STOP taking these medications   ampicillin  IVPB   naproxen sodium 220 MG tablet Commonly known as: ALEVE     TAKE these medications   aspirin 325 MG tablet Take 1 tablet (325 mg total) by mouth daily. What changed:   when to take this  reasons to take this   blood glucose meter kit and supplies Kit Dispense based on patient and insurance preference. Use up to four times daily as directed. (FOR ICD-9 250.00, 250.01).   ciprofloxacin 500 MG tablet Commonly known as: Cipro Take 1 tablet (500 mg total) by mouth 2 (two) times daily for 18 days. continue antibiotics through 11/01/19   Ensure Max Protein Liqd Take 330 mLs (11 oz total) by mouth daily.   ferrous  sulfate 325 (65 FE) MG tablet Take 1 tablet (325 mg total) by mouth daily with breakfast.   furosemide 40 MG tablet Commonly known as: Lasix Take 1 tablet (40 mg total) by mouth daily.   insulin glargine 100 UNIT/ML injection Commonly known as: LANTUS Inject 0.15 mLs (15 Units total) into the skin daily as needed (only if BGL is 120 or greater).   Insulin Syringes (Disposable) U-100 0.5 ML Misc Use as directed 3 times daily AC.   methocarbamol 500 MG tablet Commonly known as: ROBAXIN Take 1 tablet (500 mg total) by mouth every 6 (six) hours as needed for muscle spasms.   multivitamin with minerals Tabs tablet Take 1 tablet by mouth daily.   NovoLOG 100 UNIT/ML injection Generic drug: insulin aspart Inject 1 Units into the skin 2 (two) times daily as needed (only if BGL is 120 or greater).   oxyCODONE 5 MG immediate release tablet Commonly known as: Oxy IR/ROXICODONE Take 1 tablet (5 mg total) by mouth every 6 (six) hours as needed for moderate pain (pain score 4-6).   polyethylene glycol 17 g packet Commonly known as: MIRALAX / GLYCOLAX Take 17 g by mouth daily as needed for mild constipation.   potassium chloride SA 20 MEQ  tablet Commonly known as: KLOR-CON Take 1 tablet (20 mEq total) by mouth daily. What changed: how much to take   spironolactone 100 MG tablet Commonly known as: ALDACTONE Take 1 tablet (100 mg total) by mouth daily.      Allergies  Allergen Reactions  . Sulfa Antibiotics Rash and Other (See Comments)    Total body rash   Follow-up Information    Newt Minion, MD In 1 week.   Specialty: Orthopedic Surgery Contact information: [place] [address] [phone]        Clarene Essex, MD. Schedule an appointment as soon as possible for a visit in 4 week(s).   Specialty: Gastroenterology Why: Cirrhosis Contact information: [address] [place] [address] [phone]        Michel Bickers, MD Follow  up on 10/30/2019.   Specialty: Infectious Diseases Contact information: [address] [place] [address] [phone]            The results of significant diagnostics from this hospitalization (including imaging, microbiology, ancillary and laboratory) are listed below for reference.    Significant Diagnostic Studies: US RENAL  Result Date: 10/10/2019 CLINICAL DATA:  Acute renal failure. EXAM: RENAL / URINARY TRACT ULTRASOUND COMPLETE COMPARISON:  Abdominopelvic CT 09/12/2019 FINDINGS: Right Kidney: Renal measurements: 12.8 x 5.6 x 6.5 cm = volume: 245 mL. Echogenicity within normal limits. No mass or hydronephrosis visualized. Left Kidney: Renal measurements: 13.3 x 5.9 x 4.8 cm = volume: 193 mL. Echogenicity within normal limits. No mass or hydronephrosis visualized. Bladder: Only minimally distended.  Neither ureteral jet is visualized. Other: Small-moderate volume abdominopelvic ascites. Decreased ascites from 09/12/2019 abdominal CT. IMPRESSION: 1. Unremarkable sonographic appearance of the kidneys. No obstructive uropathy. 2. Urinary bladder poorly distended, not well evaluated. 3. Incidental abdominopelvic ascites, diminished ascites from CT last month. Electronically Signed   By: Keith Rake M.D.   On: 10/10/2019 20:30   [place] EKG SITE RITE  Result Date: 09/19/2019 If Site Rite image not attached, placement could not be confirmed due to current cardiac rhythm.   Microbiology: Recent Results (from the past 240 hour(s))  Respiratory Panel by RT PCR (Flu A&B, Covid) - Nasopharyngeal Swab     Status: None   Collection Time: 10/08/19 11:41 AM   Specimen: Nasopharyngeal Swab  Result Value Ref Range Status   SARS Coronavirus 2 by RT PCR NEGATIVE NEGATIVE Final    Comment: (NOTE) SARS-CoV-2 target nucleic acids are NOT DETECTED. The SARS-CoV-2 RNA is generally detectable in upper respiratoy specimens during the acute phase of infection. The lowest concentration  of SARS-CoV-2 viral copies this assay can detect is 131 copies/mL. A negative result does not preclude SARS-Cov-2 infection and should not be used as the sole basis for treatment or other patient management decisions. A negative result may occur with  improper specimen collection/handling, submission of specimen other than nasopharyngeal swab, presence of viral mutation(s) within the areas targeted by this assay, and inadequate number of viral copies (<131 copies/mL). A negative result must be combined with clinical observations, patient history, and epidemiological information. The expected result is Negative. Fact Sheet for Patients:  PinkCheek.be Fact Sheet for Healthcare Providers:  GravelBags.it This test is not yet ap proved or cleared by the [place] FDA and  has been authorized for detection and/or diagnosis of SARS-CoV-2 by FDA under an Emergency Use Authorization (EUA). This EUA will remain  in effect (meaning this test can be used) for  the duration of the COVID-19 declaration under Section 564(b)(1) of the Act, 21 U.S.C. section 360bbb-3(b)(1), unless the authorization is terminated or revoked sooner.    Influenza A by PCR NEGATIVE NEGATIVE Final   Influenza B by PCR NEGATIVE NEGATIVE Final    Comment: (NOTE) The Xpert Xpress SARS-CoV-2/FLU/RSV assay is intended as an aid in  the diagnosis of influenza from Nasopharyngeal swab specimens and  should not be used as a sole basis for treatment. Nasal washings and  aspirates are unacceptable for Xpert Xpress SARS-CoV-2/FLU/RSV  testing. Fact Sheet for Patients: PinkCheek.be Fact Sheet for Healthcare Providers: GravelBags.it This test is not yet approved or cleared by the [place] FDA and  has been authorized for detection and/or diagnosis of SARS-CoV-2 by  FDA under an Emergency Use Authorization (EUA).  This EUA will remain  in effect (meaning this test can be used) for the duration of the  Covid-19 declaration under Section 564(b)(1) of the Act, 21  U.S.C. section 360bbb-3(b)(1), unless the authorization is  terminated or revoked. Performed at [facility], [place] [address], [place], [place] [address]   Aerobic/Anaerobic Culture (surgical/deep wound)     Status: None   Collection Time: 10/08/19  1:20 PM   Specimen: Wound  Result Value Ref Range Status   Specimen Description WOUND  Final   Special Requests LEFT HIP  Final   Gram Stain   Final    ABUNDANT WBC PRESENT,BOTH PMN AND MONONUCLEAR NO ORGANISMS SEEN    Culture   Final    MODERATE ENTEROBACTER SPECIES NO ANAEROBES ISOLATED Performed at [facility], [place] [address], [place], [place] [address]    Report Status 10/13/2019 FINAL  Final   Organism ID, Bacteria ENTEROBACTER SPECIES  Final      Susceptibility   Enterobacter species - MIC*    CEFAZOLIN >=64 RESISTANT Resistant     CEFEPIME <=0.12 SENSITIVE Sensitive     CEFTAZIDIME <=1 SENSITIVE Sensitive     CIPROFLOXACIN <=0.25 SENSITIVE Sensitive     GENTAMICIN <=1 SENSITIVE Sensitive     IMIPENEM <=0.25 SENSITIVE Sensitive     TRIMETH/SULFA <=20 SENSITIVE Sensitive     PIP/TAZO <=4 SENSITIVE Sensitive     * MODERATE ENTEROBACTER SPECIES  Culture, blood (routine x 2)     Status: None (Preliminary result)   Collection Time: 10/10/19  1:50 AM   Specimen: BLOOD LEFT HAND  Result Value Ref Range Status   Specimen Description BLOOD LEFT HAND  Final   Special Requests   Final    BOTTLES DRAWN AEROBIC AND ANAEROBIC Blood Culture results may not be optimal due to an excessive volume of blood received in culture bottles   Culture   Final    NO GROWTH 4 DAYS Performed at [facility], [place] [address] [place], [place] [address]    Report Status PENDING  Incomplete  Culture, blood (routine x 2)     Status: None (Preliminary result)   Collection Time: 10/10/19   2:46 AM   Specimen: BLOOD  Result Value Ref Range Status   Specimen Description BLOOD LEFT WRIST  Final   Special Requests   Final    BOTTLES DRAWN AEROBIC AND ANAEROBIC Blood Culture adequate volume   Culture   Final    NO GROWTH 4 DAYS Performed at [facility], [place] [address] [place], [place] [address]    Report Status PENDING  Incomplete  Surgical pcr screen     Status: None  Collection Time: 10/10/19  8:16 AM   Specimen: Nasal Mucosa; Nasal Swab  Result Value Ref Range Status   MRSA, PCR NEGATIVE NEGATIVE Final   Staphylococcus aureus NEGATIVE NEGATIVE Final    Comment: (NOTE) The Xpert SA Assay (FDA approved for NASAL specimens in patients 1 years of age and older), is one component of a comprehensive surveillance program. It is not intended to diagnose infection nor to guide or monitor treatment. Performed at [facility], [place] [address], [place], [place] [address]      Labs: Basic Metabolic Panel: Recent Labs  Lab 10/10/19 [phone] 10/10/19 0831 10/11/19 0414 10/13/19 0620 10/14/19 0623  NA 134* 135 134* 136 134*  K 5.9* 5.0 3.9 3.6 3.8  CL 106 106 103 104 102  CO2 17* 21* 23 24 24   GLUCOSE 195* 172* 263* 139* 160*  BUN 29* 31* 32* 27* 29*  CREATININE 2.73* 2.56* 1.69* 0.99 0.95  CALCIUM 8.1* 7.9* 8.0* 8.2* 8.4*  PHOS  --   --  2.8  --   --    Liver Function Tests: Recent Labs  Lab 10/10/19 0208 10/10/19 0831 10/11/19 0414 10/13/19 0620 10/14/19 0623  AST 1,855* 1,944* 995* 369* 195*  ALT 668* 684* 551* 280* 206*  ALKPHOS 109 105 117 138* 136*  BILITOT 2.9* 2.5* 2.2* 1.5* 1.2  PROT 4.4* 4.3* 4.8* 5.5* 5.6*  ALBUMIN 1.8* 1.9* 2.2* 2.3* 2.3*   No results for input(s): LIPASE, AMYLASE in the last 168 hours. No results for input(s): AMMONIA in the last 168 hours. CBC: Recent Labs  Lab 10/10/19 0831 10/10/19 2135 10/11/19 0414 10/12/19 0520 10/12/19 1758 10/13/19 0620 10/14/19 0623  WBC 10.6*  --  5.8 4.7  --  6.5 8.5  NEUTROABS   --   --   --  2.9  --  3.7  --   HGB 6.1*   < > 7.3* 7.1* 9.6* 9.2* 9.3*  HCT 18.5*   < > 22.1* 22.5* 28.8* 28.6* 28.9*  MCV 86.4  --  87.7 89.3  --  90.8 90.6  PLT PLATELET CLUMPS NOTED ON SMEAR, UNABLE TO ESTIMATE  --  73* 73*  --  94* 98*   < > = values in this interval not displayed.   Cardiac Enzymes: No results for input(s): CKTOTAL, CKMB, CKMBINDEX, TROPONINI in the last 168 hours. BNP: BNP (last 3 results) Recent Labs    09/12/19 0744  BNP 23.2    ProBNP (last 3 results) No results for input(s): PROBNP in the last 8760 hours.  CBG: Recent Labs  Lab 10/13/19 1138 10/13/19 1717 10/13/19 2147 10/14/19 0819 10/14/19 1223  GLUCAP 150* 150* 144* 169* 199*       Signed:  Domenic Polite MD.  [facility] 10/14/2019, 2:04 PM

## 2019-10-14 NOTE — Progress Notes (Signed)
Occupational Therapy Treatment Patient Details Name: Robert Sullivan MRN: [id number] DOB: [birth year] Today's Date: 10/14/2019    History of present illness 54 year old gentleman presents 10/08/19 with medical history of diabetes mellitus, arthritis bil knees, DVT, HTN, and left gluteal abscess (with numerous recent hospital admissions for irrigation and debridement) and currently with wound dehiscence for repeat I&D; additional I&D with wound closure 4/3   OT comments  Pt progressing towards established OT goals. Providing education on compensatory techniques for ADLs, use of AE for LB ADLs, shower transfer, and energy conservation. Pt verbalizing understanding and reports that family will assist with LB ADLs as needed. Provided EC handout and review. Pt verbalizing ways to implement into daily routine. Update dc to home once medically stable per physician. All acute OT needs met and will sign off.    Follow Up Recommendations  No OT follow up;Supervision - Intermittent    Equipment Recommendations  None recommended by OT    Recommendations for Other Services PT consult    Precautions / Restrictions Precautions Precautions: Fall Precaution Comments: monitor BP and HR Restrictions Weight Bearing Restrictions: Yes LLE Weight Bearing: Weight bearing as tolerated       Mobility Bed Mobility Overal bed mobility: Modified Independent Bed Mobility: Supine to Sit     Supine to sit: Modified independent (Device/Increase time);HOB elevated     General bed mobility comments: increased time as needed  Transfers Overall transfer level: Needs assistance Equipment used: Rolling walker (2 wheeled) Transfers: Sit to/from Stand Sit to Stand: Supervision         General transfer comment: Supervision for safety; first time up in >24 hours and low Hgb    Balance Overall balance assessment: Needs assistance Sitting-balance support: No upper extremity supported;Feet supported Sitting  balance-Leahy Scale: Normal     Standing balance support: No upper extremity supported Standing balance-Leahy Scale: Good                             ADL either performed or assessed with clinical judgement   ADL Overall ADL's : Needs assistance/impaired                       Lower Body Dressing Details (indicate cue type and reason): Reviewing use of AE and pt reporting that he has used AE before and if he has trouble his family will help him. Toilet Transfer: Min guard;Ambulation;RW(simulated to recliner) [employer] forces technical [employer] Details (indicate cue type and reason): Min guard A for safety       Tub/Shower Transfer Details (indicate cue type and reason): Educating pt on safe shower transfer and use of shower seat for safety and energy conservation Functional mobility during ADLs: Min guard;Rolling walker General ADL Comments: Providing handout on EC and reviewing with pt. Pt verbalized understanding     Vision       Perception     Praxis      Cognition Arousal/Alertness: Awake/alert Behavior During Therapy: WFL for tasks assessed/performed Overall Cognitive Status: Within Functional Limits for tasks assessed                                          Exercises     Shoulder Instructions       General Comments HR elevating to 140s with activity. quickly returns to 100s with seated rest break. SpO2  90s on RA    Pertinent Vitals/ Pain       Pain Assessment: Faces Faces Pain Scale: Hurts little more Pain Location: LLE Pain Descriptors / Indicators: Discomfort Pain Intervention(s): Monitored during session  Home Living                                          Prior Functioning/Environment              Frequency  Min 2X/week        Progress Toward Goals  OT Goals(current goals can now be found in the care plan section)  Progress towards OT goals: Progressing toward goals  Acute Rehab OT Goals Patient  Stated Goal: To be able to walk to bathroom short term, return to independence long term OT Goal Formulation: With patient/family Time For Goal Achievement: 10/19/19 Potential to Achieve Goals: Good ADL Goals Pt Will Perform Grooming: with modified independence;standing Pt Will Perform Lower Body Dressing: with modified independence;sit to/from stand Pt Will Transfer to Toilet: with modified independence;ambulating;bedside commode Pt Will Perform Toileting - Clothing Manipulation and hygiene: with modified independence;sitting/lateral leans;sit to/from stand Additional ADL Goal #1: Pt to complete all ADLs with modified independence. Additional ADL Goal #2: Pt to tolerate standing up to 5 min with modified independence in preparation for ADLs. Additional ADL Goal #3: Pt to recall and verbalize 3 fall prevention strategies with 0 verbal cues.  Plan Discharge plan needs to be updated    Co-evaluation    PT/OT/SLP Co-Evaluation/Treatment: Yes Reason for Co-Treatment: Other (comment)(activity tolerance)   OT goals addressed during session: ADL's and self-care      AM-PAC OT "6 Clicks" Daily Activity     Outcome Measure   Help from another person eating meals?: None Help from another person taking care of personal grooming?: None Help from another person toileting, which includes using toliet, bedpan, or urinal?: A Little Help from another person bathing (including washing, rinsing, drying)?: A Little Help from another person to put on and taking off regular upper body clothing?: A Little Help from another person to put on and taking off regular lower body clothing?: A Lot 6 Click Score: 19    End of Session Equipment Utilized During Treatment: Rolling walker  OT Visit Diagnosis: Unsteadiness on feet (R26.81);Repeated falls (R29.6)   Activity Tolerance Patient limited by pain;Patient limited by fatigue   Patient Left with call bell/phone within reach;with chair alarm set;in  chair   Nurse Communication Mobility status        Time: 6599-3570 OT Time Calculation (min): 24 min  Charges: OT General Charges $OT Visit: 1 Visit OT Treatments $Self Care/Home Management : 8-22 mins  [place], OTR/L Acute Rehab Pager: [phone] Office: [place] 10/14/2019, 12:06 PM

## 2019-10-15 ENCOUNTER — Telehealth: Payer: Self-pay | Admitting: Orthopedic Surgery

## 2019-10-15 ENCOUNTER — Other Ambulatory Visit: Payer: Self-pay | Admitting: Physician Assistant

## 2019-10-15 LAB — CULTURE, BLOOD (ROUTINE X 2)
Culture: NO GROWTH
Culture: NO GROWTH
Special Requests: ADEQUATE

## 2019-10-15 MED ORDER — OXYCODONE HCL 5 MG PO TABS: 5.0000 mg | ORAL_TABLET | ORAL | 0 refills | Status: DC | PRN

## 2019-10-15 NOTE — Telephone Encounter (Signed)
I called pt and advised per Dr. Sharol Given rx will be faxed to the pharm for pain medication today, he will come in tomorrow at 3 pm for wound vac eval and we have two additional canisters we can give him here, we also made an appt for return to the office on Monday at 10:30 for vac removal. Pt voiced understanding and is please with plan.

## 2019-10-15 NOTE — Telephone Encounter (Signed)
Patient called.   He has multiple issues regarding his surgery from the weekend: medications and post op equipment. He is requesting a call back to get answers to them.  Call back: [phone]

## 2019-10-16 ENCOUNTER — Ambulatory Visit (INDEPENDENT_AMBULATORY_CARE_PROVIDER_SITE_OTHER): Payer: Self-pay | Admitting: Physician Assistant

## 2019-10-16 ENCOUNTER — Encounter (HOSPITAL_COMMUNITY): Payer: Self-pay | Admitting: Emergency Medicine

## 2019-10-16 ENCOUNTER — Other Ambulatory Visit: Payer: Self-pay

## 2019-10-16 ENCOUNTER — Encounter: Payer: Self-pay | Admitting: Physician Assistant

## 2019-10-16 ENCOUNTER — Emergency Department (HOSPITAL_COMMUNITY): Payer: Self-pay

## 2019-10-16 ENCOUNTER — Inpatient Hospital Stay (HOSPITAL_COMMUNITY)
Admission: EM | Admit: 2019-10-16 | Discharge: 2019-10-18 | DRG: 433 | Disposition: A | Discharge status: Home / Self Care | Payer: Self-pay | Attending: Internal Medicine | Admitting: Internal Medicine

## 2019-10-16 ENCOUNTER — Telehealth: Payer: Self-pay | Admitting: Gastroenterology

## 2019-10-16 VITALS — Ht 74.0 in | Wt 347.0 lb

## 2019-10-16 DIAGNOSIS — D638 Anemia in other chronic diseases classified elsewhere: Secondary | ICD-10-CM | POA: Diagnosis present

## 2019-10-16 DIAGNOSIS — K7290 Hepatic failure, unspecified without coma: Secondary | ICD-10-CM | POA: Diagnosis present

## 2019-10-16 DIAGNOSIS — T8130XA Disruption of wound, unspecified, initial encounter: Secondary | ICD-10-CM

## 2019-10-16 DIAGNOSIS — E119 Type 2 diabetes mellitus without complications: Secondary | ICD-10-CM

## 2019-10-16 DIAGNOSIS — G4730 Sleep apnea, unspecified: Secondary | ICD-10-CM | POA: Diagnosis present

## 2019-10-16 DIAGNOSIS — Z79899 Other long term (current) drug therapy: Secondary | ICD-10-CM

## 2019-10-16 DIAGNOSIS — I10 Essential (primary) hypertension: Secondary | ICD-10-CM | POA: Diagnosis present

## 2019-10-16 DIAGNOSIS — Z20822 Contact with and (suspected) exposure to covid-19: Secondary | ICD-10-CM | POA: Diagnosis present

## 2019-10-16 DIAGNOSIS — Z794 Long term (current) use of insulin: Secondary | ICD-10-CM

## 2019-10-16 DIAGNOSIS — R0601 Orthopnea: Secondary | ICD-10-CM | POA: Diagnosis present

## 2019-10-16 DIAGNOSIS — E785 Hyperlipidemia, unspecified: Secondary | ICD-10-CM | POA: Diagnosis present

## 2019-10-16 DIAGNOSIS — Z8249 Family history of ischemic heart disease and other diseases of the circulatory system: Secondary | ICD-10-CM

## 2019-10-16 DIAGNOSIS — R188 Other ascites: Secondary | ICD-10-CM | POA: Diagnosis present

## 2019-10-16 DIAGNOSIS — D696 Thrombocytopenia, unspecified: Secondary | ICD-10-CM | POA: Diagnosis present

## 2019-10-16 DIAGNOSIS — K7581 Nonalcoholic steatohepatitis (NASH): Secondary | ICD-10-CM | POA: Diagnosis present

## 2019-10-16 DIAGNOSIS — K7460 Unspecified cirrhosis of liver: Principal | ICD-10-CM | POA: Diagnosis present

## 2019-10-16 DIAGNOSIS — Z7982 Long term (current) use of aspirin: Secondary | ICD-10-CM

## 2019-10-16 LAB — PROTIME-INR
INR: 1.4 — ABNORMAL HIGH (ref 0.8–1.2)
Prothrombin Time: 16.9 seconds — ABNORMAL HIGH (ref 11.4–15.2)

## 2019-10-16 LAB — CBC WITH DIFFERENTIAL/PLATELET
Abs Immature Granulocytes: 0.21 10*3/uL — ABNORMAL HIGH (ref 0.00–0.07)
Basophils Absolute: 0.1 10*3/uL (ref 0.0–0.1)
Basophils Relative: 1 %
Eosinophils Absolute: 0.7 10*3/uL — ABNORMAL HIGH (ref 0.0–0.5)
Eosinophils Relative: 5 %
HCT: 32.9 % — ABNORMAL LOW (ref 39.0–52.0)
Hemoglobin: 10.6 g/dL — ABNORMAL LOW (ref 13.0–17.0)
Immature Granulocytes: 2 %
Lymphocytes Relative: 14 %
Lymphs Abs: 1.9 10*3/uL (ref 0.7–4.0)
MCH: 29.6 pg (ref 26.0–34.0)
MCHC: 32.2 g/dL (ref 30.0–36.0)
MCV: 91.9 fL (ref 80.0–100.0)
Monocytes Absolute: 1.3 10*3/uL — ABNORMAL HIGH (ref 0.1–1.0)
Monocytes Relative: 10 %
Neutro Abs: 9.4 10*3/uL — ABNORMAL HIGH (ref 1.7–7.7)
Neutrophils Relative %: 68 %
Platelets: 157 10*3/uL (ref 150–400)
RBC: 3.58 MIL/uL — ABNORMAL LOW (ref 4.22–5.81)
RDW: 19.5 % — ABNORMAL HIGH (ref 11.5–15.5)
WBC: 13.7 10*3/uL — ABNORMAL HIGH (ref 4.0–10.5)
nRBC: 0 % (ref 0.0–0.2)

## 2019-10-16 LAB — AMMONIA: Ammonia: 50 umol/L — ABNORMAL HIGH (ref 9–35)

## 2019-10-16 IMAGING — DX DG CHEST 1V PORT
1 series · 1 of 1 positions shown · non-contrast
Comparison: None.

CLINICAL DATA: Shortness of breath.

EXAM:
PORTABLE CHEST 1 VIEW

[chest ap]
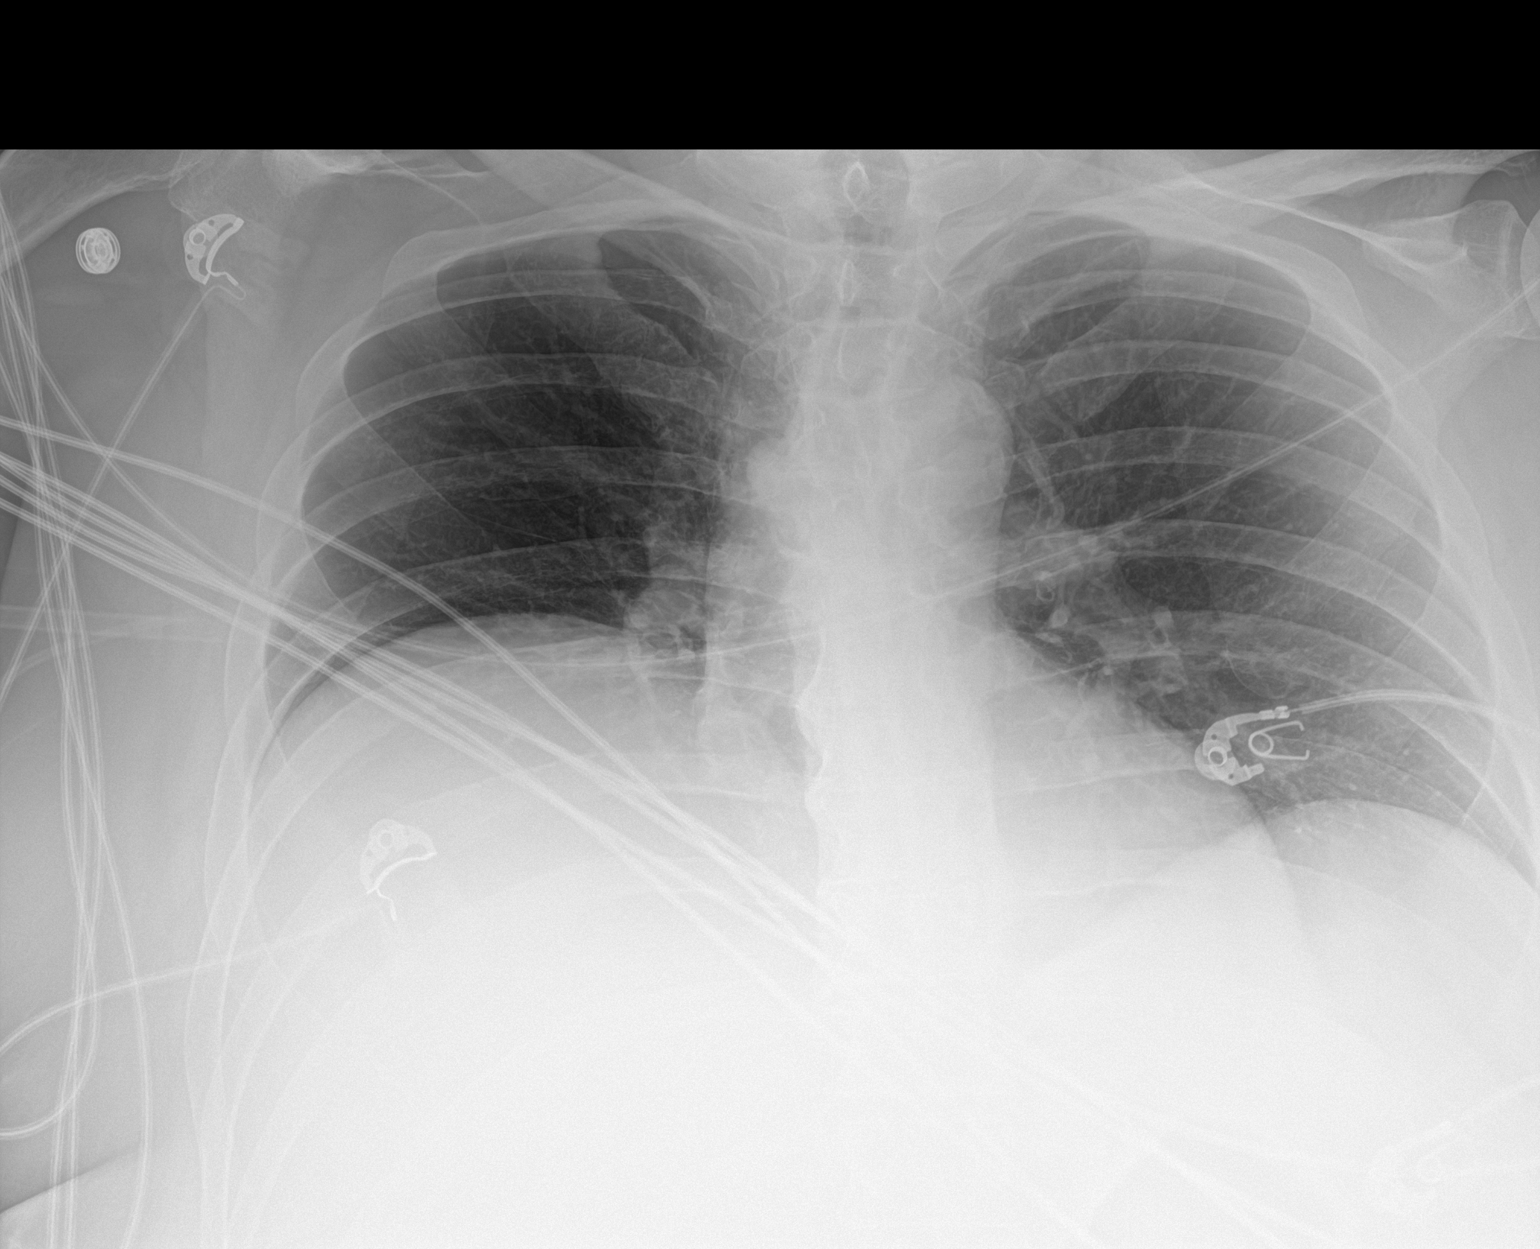

[1 of 1 positions shown; findings below may reference images not displayed]

FINDINGS: Decreased lung volumes are seen likely secondary to suboptimal
patient inspiration. There is no evidence of acute infiltrate,
pleural effusion or pneumothorax. The heart size and mediastinal
contours are within normal limits. Multilevel degenerative changes
seen throughout the thoracic spine.
IMPRESSION: No active disease.

## 2019-10-16 MED ORDER — FUROSEMIDE 10 MG/ML IJ SOLN
40.0000 mg | Freq: Once | INTRAMUSCULAR | Status: AC
  Administered 2019-10-16: 40 mg via INTRAVENOUS
  Filled 2019-10-16: qty 4

## 2019-10-16 NOTE — Progress Notes (Signed)
Office Visit Note   Patient: Robert Sullivan           Date of Birth: 1965-07-13           MRN: [id number] Visit Date: 10/16/2019              Requested by: Cathleen Corti, PA-C [place],  [place] [id number] PCP: Cathleen Corti, PA-C  Chief Complaint  Patient presents with  . Left Hip - Routine Post Op    10/11/19 I&D hip       HPI: The patient is a 54 year old gentleman who is 6 days status post repeat irrigation and debridement of his left hip.  He contacted [place] yesterday because he was running out of canisters for his wound VAC.  He also was requesting pain medication as the prescription he received was not signed and the pharmacy would not fill it.  He comes in today most concerned that he has gained 25 pounds in the last week.  He has had a history of ascites that has been drained from sclerosing liver disease.  His biggest concern today is of increasing difficulty with breathing and moving because of his ascites he called his primary care this morning and was advised to follow-up with [place] but also advised to proceed to the emergency department  Assessment & Plan: Visit Diagnoses: No diagnosis found.  Plan: Patient was seen today directly by Dr. Sharol Given.  We have given him canisters and changed out his canister today.  It had light pink drainage.  It was not draining significantly at the visit.  Given his shortness of breath and increasing discomfort with breathing and moving we have recommended as well that he proceed to the emergency room  Follow-Up Instructions: No follow-ups on file.   Ortho Exam  Patient is alert, oriented, no adenopathy, well-dressed, normal affect, patient is obviously short of breath and does not tolerate lying in a supine position for very long.  He is alert and oriented but obviously uncomfortable.  His left hip is not particularly uncomfortable wound VAC has a good seal   Imaging: No results found. No images are attached to the encounter.  Labs: Lab Results  Component Value Date   HGBA1C 4.6 (L) 09/12/2019   HGBA1C 5.5 07/10/2019   HGBA1C 11.0 (H) 05/13/2019   ESRSEDRATE 12 10/06/2019   ESRSEDRATE 15 09/12/2019   ESRSEDRATE 12 07/11/2019   CRP 2.8 (H) 10/06/2019   CRP 1.9 (H) 09/25/2019   CRP 1.8 (H) 09/24/2019   REPTSTATUS 10/15/2019 FINAL 10/10/2019   GRAMSTAIN  10/08/2019    ABUNDANT WBC PRESENT,BOTH PMN AND MONONUCLEAR NO ORGANISMS SEEN    CULT  10/10/2019    NO GROWTH 5 DAYS Performed at [facility], [place] [address], [place], [place] [address]    [place] SPECIES 10/08/2019     Lab Results  Component Value Date   ALBUMIN 2.3 (L) 10/14/2019   ALBUMIN 2.3 (L) 10/13/2019   ALBUMIN 2.2 (L) 10/11/2019    Lab Results  Component Value Date   MG 1.8 09/25/2019   MG 1.9 09/24/2019   MG 1.8 09/23/2019   No results found for: VD25OH  No results found for: PREALBUMIN CBC EXTENDED Latest Ref Rng & Units 10/14/2019 10/13/2019 10/12/2019  WBC 4.0 - 10.5 K/uL 8.5 6.5 -  RBC 4.22 - 5.81 MIL/uL 3.19(L) 3.15(L) -  HGB 13.0 - 17.0 g/dL 9.3(L) 9.2(L) 9.6(L)  HCT 39.0 - 52.0 % 28.9(L) 28.6(L) 28.8(L)  PLT 150 - 400 K/uL 98(L) 94(L) -  NEUTROABS 1.7 - 7.7 K/uL - 3.7 -  LYMPHSABS 0.7 - 4.0 K/uL - 1.3 -     Body mass index is 44.55 kg/m.  Orders:  No orders of the defined types were placed in this encounter.  No orders of the defined types were placed in this encounter.    Procedures: No procedures performed  Clinical Data: No additional findings.  ROS:  All other systems negative, except as noted in the HPI. Review of Systems  Objective: Vital Signs: Ht 6' 2"  (1.88 m)   Wt (!) 347 lb (157.4 kg)   BMI 44.55 kg/m   Specialty Comments:  No specialty comments available.  PMFS History: Patient Active Problem List   Diagnosis Date Noted  . Anemia 10/09/2019  . AKI (acute kidney injury) ([place]) 10/09/2019  . Wound dehiscence 10/08/2019  . Ascites   . Anasarca 09/12/2019  .  Cirrhosis of liver with ascites ([place]) 09/12/2019  . Abscess, gluteal, left 09/12/2019  . Pleural effusion 09/12/2019  . Thrombocytopenia ([place]) 09/12/2019  . Blister of hip with infection 07/09/2019  . Abscess of left hip   . MSSA bacteremia 06/19/2019  . Uncontrolled diabetes mellitus ([place]) 06/10/2019  . Obesity, Class III, BMI 40-49.9 (morbid obesity) ([place]) 06/10/2019  . Cellulitis, gluteal, left 06/10/2019  . Sepsis secondary to UTI ([place]) 05/13/2019  . Acute lower UTI 05/13/2019  . Type 2 diabetes mellitus without complication ([place]) 62/86/3817  . Hip pain, acute, left 05/13/2019  . Sepsis ([place]) 05/13/2019  . Acute cystitis without hematuria   . Hyperglycemia    Past Medical History:  Diagnosis Date  . Anemia   . Anxiety   . Arthritis 05/13/2019   knees  . Cirrhosis ([place])   . Depression   . Diabetes mellitus without complication ([place])   . DVT (deep venous thrombosis) ([place])   . Gallstones   . Gluteal abscess   . Headache   . HTN (hypertension)   . Hyperlipidemia   . Sleep apnea    doesn't use cpap  . Splenomegaly     Family History  Problem Relation Age of Onset  . Hypertension Father     Past Surgical History:  Procedure Laterality Date  . I & D EXTREMITY Left 07/16/2019   Procedure: LEFT HIP DEBRIDEMENT;  Surgeon: Newt Minion, MD;  Location: [facility];  Service: Orthopedics;  Laterality: Left;  . I & D EXTREMITY Left 07/18/2019   Procedure: REPEAT IRRIGATION AND DEBRIDEMENT LEFT HIP;  Surgeon: Newt Minion, MD;  Location: [facility];  Service: Orthopedics;  Laterality: Left;  . I & D EXTREMITY Left 09/19/2019   Procedure: DEBRIDEMENT LEFT HIP Appil[place] OF WOUND VAC;  Surgeon: Newt Minion, MD;  Location: [facility];  Service: Orthopedics;  Laterality: Left;  . I & D EXTREMITY Left 10/08/2019   Procedure: IRRIGATION AND DEBRIDEMENT, REVISION LEFT HIP WOUND;  Surgeon: Newt Minion, MD;  Location: [facility];  Service: Orthopedics;  Laterality: Left;  . INCISION AND DRAINAGE  ABSCESS Left 06/23/2019   Procedure: INCISION AND DRAINAGE GLUTEAL ABSCESS;  Surgeon: Renette Butters, MD;  Location: [facility];  Service: Orthopedics;  Laterality: Left;  . INCISION AND DRAINAGE ABSCESS Left 07/09/2019   Procedure: INCISION AND DRAINAGE LEFT HIP/PELVIC ABSCESS, APPLICATION OF NEGATIVE PRESSURE WOUND VAC;  Surgeon: Newt Minion, MD;  Location: [facility];  Service: Orthopedics;  Laterality: Left;  . INCISION AND DRAINAGE HIP Left 06/25/2019   Procedure:  IRRIGATION AND DEBRIDEMENT HIP;  Surgeon: Shona Needles, MD;  Location: [facility];  Service: Orthopedics;  Laterality: Left;  . INCISION AND DRAINAGE HIP Left 10/11/2019   Procedure: IRRIGATION AND DEBRIDEMENT HIP;  Surgeon: Newt Minion, MD;  Location: [facility];  Service: Orthopedics;  Laterality: Left;  . INCISION AND DRAINAGE OF WOUND Left 07/12/2019   Procedure: LEFT HIP DEBRIDEMENT;  Surgeon: Newt Minion, MD;  Location: [facility];  Service: Orthopedics;  Laterality: Left;  . IR PARACENTESIS  09/13/2019  . IR US GUIDE BX ASP/DRAIN  06/17/2019  . IR US GUIDE BX ASP/DRAIN  09/13/2019  . KNEE ARTHROSCOPY Bilateral    left x 2, 1 right  . LAPAROSCOPIC CHOLECYSTECTOMY  ~ 2010  . LUMBAR DISC SURGERY    . NASAL SINUS SURGERY    . TEE WITHOUT CARDIOVERSION N/A 06/16/2019   Procedure: TRANSESOPHAGEAL ECHOCARDIOGRAM (TEE);  Surgeon: Dorothy Spark, MD;  Location[facility];  Service: Cardiovascular;  Laterality: N/A;   Social History   Occupational History  . Occupation: Logistics  Tobacco Use  . Smoking status: Former Smoker    Types: Cigars    Quit date: 09/10/2017    Years since quitting: 2.0  . Smokeless tobacco: Never Used  Substance and Sexual Activity  . Alcohol use: Not Currently  . Drug use: Not Currently  . Sexual activity: Not on file

## 2019-10-16 NOTE — Telephone Encounter (Signed)
The pt called to state he was released from the hospital 2 days ago for "debriedment" . He says has gained about 25 lbs in 2 days and can not breathe.  He was noticeably short of breathe on the phone.  He says "I can't make it like this".  He was advised to go to the ED for eval.  Robert Sullivan any further recommendations?

## 2019-10-16 NOTE — ED Provider Notes (Signed)
[facility] EMERGENCY DEPARTMENT Provider Note   CSN: [id number] Arrival date & time: 10/16/19  2222     History Chief Complaint  Patient presents with  . Shortness of Breath    Robert Sullivan is a 54 y.o. male.  Patient presents to the emergency department with complaints of shortness of breath and abdominal pain.  Patient reports a history of liver disease and is currently dealing with a chronic wound that has required multiple debridements in the OR.  He was just hospitalized for 10 days with multiple debridements and received blood transfusions.  He reports that when he was discharged he noticed that his weight was up 25 pounds from his dry weight.  When he started to walk and move around after discharge he started to notice shortness of breath and abdominal discomfort.  Patient reports diffuse, constant abdominal pain.  No fever.  Patient is short of breath at rest but worsens with any exertion.        Past Medical History:  Diagnosis Date  . Anemia   . Anxiety   . Arthritis 05/13/2019   knees  . Cirrhosis ([place])   . Depression   . Diabetes mellitus without complication ([place])   . DVT (deep venous thrombosis) ([place])   . Gallstones   . Gluteal abscess   . Headache   . HTN (hypertension)   . Hyperlipidemia   . Sleep apnea    doesn't use cpap  . Splenomegaly     Patient Active Problem List   Diagnosis Date Noted  . Anemia 10/09/2019  . AKI (acute kidney injury) ([place]) 10/09/2019  . Wound dehiscence 10/08/2019  . Ascites   . Anasarca 09/12/2019  . Cirrhosis of liver with ascites ([place]) 09/12/2019  . Abscess, gluteal, left 09/12/2019  . Pleural effusion 09/12/2019  . Thrombocytopenia ([place]) 09/12/2019  . Blister of hip with infection 07/09/2019  . Abscess of left hip   . MSSA bacteremia 06/19/2019  . Uncontrolled diabetes mellitus ([place]) 06/10/2019  . Obesity, Class III, BMI 40-49.9 (morbid obesity) ([place]) 06/10/2019  . Cellulitis, gluteal, left  06/10/2019  . Sepsis secondary to UTI ([place]) 05/13/2019  . Acute lower UTI 05/13/2019  . Type 2 diabetes mellitus without complication ([place]) 83/41/9622  . Hip pain, acute, left 05/13/2019  . Sepsis ([place]) 05/13/2019  . Acute cystitis without hematuria   . Hyperglycemia     Past Surgical History:  Procedure Laterality Date  . I & D EXTREMITY Left 07/16/2019   Procedure: LEFT HIP DEBRIDEMENT;  Surgeon: Newt Minion, MD;  Location: [place];  Service: Orthopedics;  Laterality: Left;  . I & D EXTREMITY Left 07/18/2019   Procedure: REPEAT IRRIGATION AND DEBRIDEMENT LEFT HIP;  Surgeon: Newt Minion, MD;  Location: [place];  Service: Orthopedics;  Laterality: Left;  . I & D EXTREMITY Left 09/19/2019   Procedure: DEBRIDEMENT LEFT HIP [place] OF WOUND VAC;  Surgeon: Newt Minion, MD;  Location: [place];  Service: Orthopedics;  Laterality: Left;  . I & D EXTREMITY Left 10/08/2019   Procedure: IRRIGATION AND DEBRIDEMENT, REVISION LEFT HIP WOUND;  Surgeon: Newt Minion, MD;  Location: [place];  Service: Orthopedics;  Laterality: Left;  . INCISION AND DRAINAGE ABSCESS Left 06/23/2019   Procedure: INCISION AND DRAINAGE GLUTEAL ABSCESS;  Surgeon: Renette Butters, MD;  Location: [place];  Service: Orthopedics;  Laterality: Left;  . INCISION AND DRAINAGE ABSCESS Left 07/09/2019   Procedure: INCISION AND DRAINAGE LEFT HIP/PELVIC ABSCESS, APPLICATION OF  NEGATIVE PRESSURE WOUND VAC;  Surgeon: Newt Minion, MD;  Location: [place];  Service: Orthopedics;  Laterality: Left;  . INCISION AND DRAINAGE HIP Left 06/25/2019   Procedure: IRRIGATION AND DEBRIDEMENT HIP;  Surgeon: Shona Needles, MD;  Location: [place];  Service: Orthopedics;  Laterality: Left;  . INCISION AND DRAINAGE HIP Left 10/11/2019   Procedure: IRRIGATION AND DEBRIDEMENT HIP;  Surgeon: Newt Minion, MD;  Location: [place];  Service: Orthopedics;  Laterality: Left;  . INCISION AND DRAINAGE OF WOUND Left 07/12/2019   Procedure: LEFT HIP DEBRIDEMENT;   Surgeon: Newt Minion, MD;  Location: [place];  Service: Orthopedics;  Laterality: Left;  . IR PARACENTESIS  09/13/2019  . IR US GUIDE BX ASP/DRAIN  06/17/2019  . IR US GUIDE BX ASP/DRAIN  09/13/2019  . KNEE ARTHROSCOPY Bilateral    left x 2, 1 right  . LAPAROSCOPIC CHOLECYSTECTOMY  ~ 2010  . LUMBAR DISC SURGERY    . NASAL SINUS SURGERY    . TEE WITHOUT CARDIOVERSION N/A 06/16/2019   Procedure: TRANSESOPHAGEAL ECHOCARDIOGRAM (TEE);  Surgeon: Dorothy Spark, MD;  Location: [facility] ENDOSCOPY;  Service: Cardiovascular;  Laterality: N/A;       Family History  Problem Relation Age of Onset  . Hypertension Father     Social History   Tobacco Use  . Smoking status: Former Smoker    Types: Cigars    Quit date: 09/10/2017    Years since quitting: 2.1  . Smokeless tobacco: Never Used  Substance Use Topics  . Alcohol use: Not Currently  . Drug use: Not Currently    Home Medications Prior to Admission medications   Medication Sig Start Date End Date Taking? Authorizing Provider  aspirin 325 MG tablet Take 1 tablet (325 mg total) by mouth daily. Patient taking differently: Take 325 mg by mouth as needed for mild pain or headache.  07/25/19   Persons, Bevely Palmer, PA  blood glucose meter kit and supplies KIT Dispense based on patient and insurance preference. Use up to four times daily as directed. (FOR ICD-9 250.00, 250.01). 06/30/19   Hongalgi, Lenis Dickinson, MD  ciprofloxacin (CIPRO) 500 MG tablet Take 1 tablet (500 mg total) by mouth 2 (two) times daily for 18 days. continue antibiotics through 11/01/19 10/14/19 11/01/19  Domenic Polite, MD  Ensure Max Protein (ENSURE MAX PROTEIN) LIQD Take 330 mLs (11 oz total) by mouth daily. 07/01/19   Hongalgi, Lenis Dickinson, MD  ferrous sulfate 325 (65 FE) MG tablet Take 1 tablet (325 mg total) by mouth daily with breakfast. 09/24/19 10/24/19  Cherylann Ratel A, DO  furosemide (LASIX) 40 MG tablet Take 1 tablet (40 mg total) by mouth daily. 09/24/19 10/24/19  Cherylann Ratel A, DO    insulin aspart (NOVOLOG) 100 UNIT/ML injection Inject 1 Units into the skin 2 (two) times daily as needed (only if BGL is 120 or greater).     [provider]  insulin glargine (LANTUS) 100 UNIT/ML injection Inject 0.15 mLs (15 Units total) into the skin daily as needed (only if BGL is 120 or greater). 10/14/19   Domenic Polite, MD  Insulin Syringes, Disposable, U-100 0.5 ML MISC Use as directed 3 times daily AC. 06/30/19   Hongalgi, Lenis Dickinson, MD  methocarbamol (ROBAXIN) 500 MG tablet Take 1 tablet (500 mg total) by mouth every 6 (six) hours as needed for muscle spasms. 10/14/19   Domenic Polite, MD  Multiple Vitamin (MULTIVITAMIN WITH MINERALS) TABS tablet Take 1 tablet by mouth  daily. 07/01/19   Modena Jansky, MD  oxyCODONE (OXY IR/ROXICODONE) 5 MG immediate release tablet Take 1 tablet (5 mg total) by mouth every 4 (four) hours as needed for moderate pain (pain score 4-6). 10/15/19   Persons, Bevely Palmer, PA  polyethylene glycol (MIRALAX / GLYCOLAX) 17 g packet Take 17 g by mouth daily as needed for mild constipation. 10/14/19   Domenic Polite, MD  potassium chloride SA (KLOR-CON) 20 MEQ tablet Take 1 tablet (20 mEq total) by mouth daily. 10/14/19 11/13/19  Domenic Polite, MD  spironolactone (ALDACTONE) 100 MG tablet Take 1 tablet (100 mg total) by mouth daily. 09/25/19 10/25/19  Cherylann Ratel A, DO    Allergies    Sulfa antibiotics  Review of Systems   Review of Systems  Respiratory: Positive for shortness of breath.   Cardiovascular: Positive for leg swelling.  Gastrointestinal: Positive for abdominal distention and abdominal pain.  All other systems reviewed and are negative.   Physical Exam Updated Vital Signs BP 115/67   Pulse 96   Temp 98.6 F (37 C) (Oral)   Resp 16   Ht 6' 2"  (1.88 m)   Wt (!) 158.8 kg   SpO2 97%   BMI 44.96 kg/m   Physical Exam Vitals and nursing note reviewed.  Constitutional:      General: He is not in acute distress.    Appearance: Normal  appearance. He is well-developed.  HENT:     Head: Normocephalic and atraumatic.     Right Ear: Hearing normal.     Left Ear: Hearing normal.     Nose: Nose normal.  Eyes:     Conjunctiva/sclera: Conjunctivae normal.     Pupils: Pupils are equal, round, and reactive to light.  Cardiovascular:     Rate and Rhythm: Regular rhythm. Tachycardia present.     Heart sounds: S1 normal and S2 normal. No murmur. No friction rub. No gallop.   Pulmonary:     Effort: Pulmonary effort is normal. Tachypnea present. No respiratory distress.     Breath sounds: Normal breath sounds.  Chest:     Chest wall: No tenderness.  Abdominal:     General: Bowel sounds are normal. There is distension.     Palpations: Abdomen is soft.     Tenderness: There is generalized abdominal tenderness. There is no guarding or rebound. Negative signs include Murphy's sign and McBurney's sign.     Hernia: No hernia is present.  Musculoskeletal:        General: Normal range of motion.     Cervical back: Normal range of motion and neck supple.     Right lower leg: 3+ Pitting Edema present.     Left lower leg: 3+ Pitting Edema present.  Skin:    General: Skin is warm and dry.     Findings: No rash.  Neurological:     Mental Status: He is alert and oriented to person, place, and time.     GCS: GCS eye subscore is 4. GCS verbal subscore is 5. GCS motor subscore is 6.     Cranial Nerves: No cranial nerve deficit.     Sensory: No sensory deficit.     Coordination: Coordination normal.  Psychiatric:        Speech: Speech normal.        Behavior: Behavior normal.        Thought Content: Thought content normal.     ED Results / Procedures / Treatments   Labs (all labs  ordered are listed, but only abnormal results are displayed) Labs Reviewed  CBC WITH DIFFERENTIAL/PLATELET - Abnormal; Notable for the following components:      Result Value   WBC 13.7 (*)    RBC 3.58 (*)    Hemoglobin 10.6 (*)    HCT 32.9 (*)    RDW  19.5 (*)    Neutro Abs 9.4 (*)    Monocytes Absolute 1.3 (*)    Eosinophils Absolute 0.7 (*)    Abs Immature Granulocytes 0.21 (*)    All other components within normal limits  COMPREHENSIVE METABOLIC PANEL - Abnormal; Notable for the following components:   Sodium 132 (*)    CO2 19 (*)    Glucose, Bld 181 (*)    BUN 27 (*)    Calcium 8.5 (*)    Total Protein 6.2 (*)    Albumin 2.4 (*)    AST 101 (*)    ALT 107 (*)    Alkaline Phosphatase 166 (*)    Total Bilirubin 1.4 (*)    All other components within normal limits  LACTIC ACID, PLASMA - Abnormal; Notable for the following components:   Lactic Acid, Venous 3.5 (*)    All other components within normal limits  URINALYSIS, ROUTINE W REFLEX MICROSCOPIC - Abnormal; Notable for the following components:   APPearance HAZY (*)    Leukocytes,Ua MODERATE (*)    Bacteria, UA RARE (*)    All other components within normal limits  PROTIME-INR - Abnormal; Notable for the following components:   Prothrombin Time 16.9 (*)    INR 1.4 (*)    All other components within normal limits  AMMONIA - Abnormal; Notable for the following components:   Ammonia 50 (*)    All other components within normal limits  SARS CORONAVIRUS 2 (TAT 6-24 HRS)  BRAIN NATRIURETIC PEPTIDE  LIPASE, BLOOD  TROPONIN I (HIGH SENSITIVITY)  TROPONIN I (HIGH SENSITIVITY)    EKG None  Radiology CT ABDOMEN PELVIS W CONTRAST  Result Date: 10/17/2019 CLINICAL DATA:  Abdominal distension. EXAM: CT ABDOMEN AND PELVIS WITH CONTRAST TECHNIQUE: Multidetector CT imaging of the abdomen and pelvis was performed using the standard protocol following bolus administration of intravenous contrast. CONTRAST:  14m OMNIPAQUE IOHEXOL 300 MG/ML  SOLN COMPARISON:  September 12, 2019 FINDINGS: Lower chest: There is a small left-sided pleural effusion.The heart size is normal. Hepatobiliary: The liver is cirrhotic. There is a tiny hypodense nodule at the dome of the liver (axial series 4, image  8). This is favored to represent a small cyst. Patient is status post cholecystectomy. There is no evidence for biliary ductal dilatation. Pancreas: Normal contours without ductal dilatation. No peripancreatic fluid collection. Spleen: The spleen is significantly enlarged measuring approximately 18 cm craniocaudad. Adrenals/Urinary Tract: --Adrenal glands: Unremarkable. --Right kidney/ureter: No hydronephrosis or radiopaque kidney stones. --Left kidney/ureter: No hydronephrosis or radiopaque kidney stones. --Urinary bladder: Unremarkable. Stomach/Bowel: --Stomach/Duodenum: Esophageal varices are noted. --Small bowel: Unremarkable. --Colon: Unremarkable. --Appendix: Normal. Vascular/Lymphatic: There is no abdominal aortic aneurysm. The portal vein is patent. The splenic vein is patent. The SMV is patent. There is a splenorenal shunt. --No retroperitoneal lymphadenopathy. --No mesenteric lymphadenopathy. --No pelvic or inguinal lymphadenopathy. Reproductive: Unremarkable Other: There is a large volume of abdominal ascites. The abdominal wall is normal. Musculoskeletal. No acute displaced fractures. The previously demonstrated left hip abscess has essentially resolved. There is a small residual pocket of gas in this location which may related to prior intervention. There is no CT evidence for left  hip osteomyelitis. IMPRESSION: 1. Cirrhosis with stigmata of portal hypertension. 2. Large volume abdominal ascites. 3. Small left-sided pleural effusion. Electronically Signed   By: Constance Holster M.D.   On: 10/17/2019 02:46   DG Chest Port 1 View  Result Date: 10/16/2019 CLINICAL DATA:  Shortness of breath. EXAM: PORTABLE CHEST 1 VIEW COMPARISON:  None. FINDINGS: Decreased lung volumes are seen likely secondary to suboptimal patient inspiration. There is no evidence of acute infiltrate, pleural effusion or pneumothorax. The heart size and mediastinal contours are within normal limits. Multilevel degenerative changes  seen throughout the thoracic spine. IMPRESSION: No active disease. Electronically Signed   By: Virgina Norfolk M.D.   On: 10/16/2019 23:37    Procedures Procedures (including critical care time)  Medications Ordered in ED Medications  ondansetron [facility]) injection 4 mg (0 mg Intravenous Hold 10/17/19 0208)  furosemide (LASIX) injection 40 mg (40 mg Intravenous Given 10/16/19 2345)  morphine 4 MG/ML injection 4 mg (4 mg Intravenous Given 10/17/19 0206)  iohexol (OMNIPAQUE) 300 MG/ML solution 100 mL (100 mLs Intravenous Contrast Given 10/17/19 0157)    ED Course  I have reviewed the triage vital signs and the nursing notes.  Pertinent labs & imaging results that were available during my care of the patient were reviewed by me and considered in my medical decision making (see chart for details).  Clinical Course as of Oct 17 318  Fri Oct 17, 2019  0316 Lactic acid elevated, no signs of sepsis at this time.  Elevated lactic acid likely secondary to his liver failure.  Lactic Acid, Venous(!!): 3.5 [CP]    Clinical Course User Index [CP] Shaneil Yazdi, Gwenyth Allegra, MD   MDM Rules/Calculators/A&P                      Patient presents to the emergency department for evaluation of abdominal pain, abdominal distention and shortness of breath.  Patient has a history of nonalcoholic liver cirrhosis.  He has been treated recently for nonhealing infected wound on his hip.  Patient was recently hospitalized for surgical debridement and upon discharge found himself to be extremely short of breath, weak and volume overloaded.  He reports that upon discharge his weight was 25 pounds heavier than his previous lean weight.  Patient with abdominal distention and tenderness without obvious signs of peritonitis.  Doubt SBP as he is currently on cefepime for treatment of his wound infection.  CT abdomen and pelvis does show large volume ascites.  Patient has significant third spacing of fluid and will require  paracentesis as well as further management of his third spacing, will readmit to hospital.  Final Clinical Impression(s) / ED Diagnoses Final diagnoses:  Other ascites    Rx / DC Orders ED Discharge Orders    None       Kierria Feigenbaum, Gwenyth Allegra, MD 10/17/19 0320

## 2019-10-16 NOTE — Telephone Encounter (Signed)
Patty and Anderson Malta, Looks like patient had care taken over by Dr. Watt Climes and [facility] who had plan for follow up in clinic on last notation in chart and was last seen by [facility] on 4/5. As such, he should reach out to them for next steps in his evaluation, though I agree that it seems he likely needs an ED evaluation as has been mentioned since he has gained that significant amount of weight and he is having issues breathing. Please let patient know to reach out to [facility] whom consulted and set up follow up for him but sounds like he is very sick. Thanks. GM

## 2019-10-16 NOTE — Telephone Encounter (Signed)
Any other recommendations Dr. Rush Landmark? Looks like he was started on Lasix 40 and Aldactone 100 in the hospital.

## 2019-10-16 NOTE — Telephone Encounter (Signed)
Patienis calling back states he is at the orthpethic office now but he is confused as to why he was sent there and is seeking advise

## 2019-10-16 NOTE — ED Triage Notes (Signed)
Patient here with shortness of breath.  He feels like he needs a paracentesis.  He does have ascities from fatty liver disease.  Patient was just discharged from [facility] yesterday.  Patient has had multiple surgeries from a hematoma that turned into an abscess on his right buttock and right leg.  Wound vac apparent.

## 2019-10-16 NOTE — Telephone Encounter (Signed)
Left message on machine to call back  

## 2019-10-16 NOTE — Telephone Encounter (Signed)
The pt has been advised and states that he has called [facility] and is waiting for an appt at this time.

## 2019-10-17 ENCOUNTER — Observation Stay (HOSPITAL_COMMUNITY): Payer: Self-pay

## 2019-10-17 ENCOUNTER — Encounter (HOSPITAL_COMMUNITY): Payer: Self-pay | Admitting: Radiology

## 2019-10-17 ENCOUNTER — Emergency Department (HOSPITAL_COMMUNITY): Payer: Self-pay

## 2019-10-17 DIAGNOSIS — K7290 Hepatic failure, unspecified without coma: Secondary | ICD-10-CM

## 2019-10-17 DIAGNOSIS — E119 Type 2 diabetes mellitus without complications: Secondary | ICD-10-CM

## 2019-10-17 DIAGNOSIS — K7460 Unspecified cirrhosis of liver: Principal | ICD-10-CM

## 2019-10-17 HISTORY — PX: IR PARACENTESIS: IMG2679

## 2019-10-17 LAB — BRAIN NATRIURETIC PEPTIDE: B Natriuretic Peptide: 21.6 pg/mL (ref 0.0–100.0)

## 2019-10-17 LAB — SARS CORONAVIRUS 2 (TAT 6-24 HRS): SARS Coronavirus 2: NEGATIVE

## 2019-10-17 LAB — GRAM STAIN

## 2019-10-17 LAB — COMPREHENSIVE METABOLIC PANEL
ALT: 107 U/L — ABNORMAL HIGH (ref 0–44)
ALT: 81 U/L — ABNORMAL HIGH (ref 0–44)
AST: 101 U/L — ABNORMAL HIGH (ref 15–41)
AST: 78 U/L — ABNORMAL HIGH (ref 15–41)
Albumin: 2.4 g/dL — ABNORMAL LOW (ref 3.5–5.0)
Albumin: 2.5 g/dL — ABNORMAL LOW (ref 3.5–5.0)
Alkaline Phosphatase: 166 U/L — ABNORMAL HIGH (ref 38–126)
Alkaline Phosphatase: 126 U/L (ref 38–126)
Anion gap: 12 (ref 5–15)
Anion gap: 10 (ref 5–15)
BUN: 27 mg/dL — ABNORMAL HIGH (ref 6–20)
BUN: 26 mg/dL — ABNORMAL HIGH (ref 6–20)
CO2: 19 mmol/L — ABNORMAL LOW (ref 22–32)
CO2: 22 mmol/L (ref 22–32)
Calcium: 8.5 mg/dL — ABNORMAL LOW (ref 8.9–10.3)
Calcium: 8.5 mg/dL — ABNORMAL LOW (ref 8.9–10.3)
Chloride: 101 mmol/L (ref 98–111)
Chloride: 101 mmol/L (ref 98–111)
Creatinine, Ser: 1.22 mg/dL (ref 0.61–1.24)
Creatinine, Ser: 1.15 mg/dL (ref 0.61–1.24)
GFR calc Af Amer: >60 mL/min (ref >60)
GFR calc Af Amer: >60 mL/min (ref >60)
GFR calc non Af Amer: >60 mL/min (ref >60)
GFR calc non Af Amer: >60 mL/min (ref >60)
Glucose, Bld: 181 mg/dL — ABNORMAL HIGH (ref 70–99)
Glucose, Bld: 147 mg/dL — ABNORMAL HIGH (ref 70–99)
Potassium: 3.6 mmol/L (ref 3.5–5.1)
Potassium: 3.5 mmol/L (ref 3.5–5.1)
Sodium: 132 mmol/L — ABNORMAL LOW (ref 135–145)
Sodium: 133 mmol/L — ABNORMAL LOW (ref 135–145)
Total Bilirubin: 1.4 mg/dL — ABNORMAL HIGH (ref 0.3–1.2)
Total Bilirubin: 1.3 mg/dL — ABNORMAL HIGH (ref 0.3–1.2)
Total Protein: 6.2 g/dL — ABNORMAL LOW (ref 6.5–8.1)
Total Protein: 5.6 g/dL — ABNORMAL LOW (ref 6.5–8.1)

## 2019-10-17 LAB — MAGNESIUM: Magnesium: 1.8 mg/dL (ref 1.7–2.4)

## 2019-10-17 LAB — TROPONIN I (HIGH SENSITIVITY)
Troponin I (High Sensitivity): 6 ng/L (ref <18)
Troponin I (High Sensitivity): 6 ng/L (ref <18)

## 2019-10-17 LAB — URINALYSIS, ROUTINE W REFLEX MICROSCOPIC
Bilirubin Urine: NEGATIVE
Glucose, UA: NEGATIVE mg/dL
Hgb urine dipstick: NEGATIVE
Ketones, ur: NEGATIVE mg/dL
Nitrite: NEGATIVE
Protein, ur: NEGATIVE mg/dL
Specific Gravity, Urine: 1.01 (ref 1.005–1.030)
pH: 6 (ref 5.0–8.0)

## 2019-10-17 LAB — CBC WITH DIFFERENTIAL/PLATELET
Abs Immature Granulocytes: 0.09 10*3/uL — ABNORMAL HIGH (ref 0.00–0.07)
Basophils Absolute: 0.1 10*3/uL (ref 0.0–0.1)
Basophils Relative: 1 %
Eosinophils Absolute: 0.7 10*3/uL — ABNORMAL HIGH (ref 0.0–0.5)
Eosinophils Relative: 10 %
HCT: 27.4 % — ABNORMAL LOW (ref 39.0–52.0)
Hemoglobin: 8.7 g/dL — ABNORMAL LOW (ref 13.0–17.0)
Immature Granulocytes: 1 %
Lymphocytes Relative: 22 %
Lymphs Abs: 1.6 10*3/uL (ref 0.7–4.0)
MCH: 29.6 pg (ref 26.0–34.0)
MCHC: 31.8 g/dL (ref 30.0–36.0)
MCV: 93.2 fL (ref 80.0–100.0)
Monocytes Absolute: 0.9 10*3/uL (ref 0.1–1.0)
Monocytes Relative: 12 %
Neutro Abs: 3.9 10*3/uL (ref 1.7–7.7)
Neutrophils Relative %: 54 %
Platelets: DECREASED 10*3/uL (ref 150–400)
RBC: 2.94 MIL/uL — ABNORMAL LOW (ref 4.22–5.81)
RDW: 19.7 % — ABNORMAL HIGH (ref 11.5–15.5)
WBC: 7.2 10*3/uL (ref 4.0–10.5)
nRBC: 0 % (ref 0.0–0.2)

## 2019-10-17 LAB — ALBUMIN, PLEURAL OR PERITONEAL FLUID: Albumin, Fluid: <1 g/dL

## 2019-10-17 LAB — BODY FLUID CELL COUNT WITH DIFFERENTIAL
Eos, Fluid: 0 %
Lymphs, Fluid: 70 %
Monocyte-Macrophage-Serous Fluid: 18 % — ABNORMAL LOW (ref 50–90)
Neutrophil Count, Fluid: 12 % (ref 0–25)
Total Nucleated Cell Count, Fluid: 167 cu mm (ref 0–1000)

## 2019-10-17 LAB — CBG MONITORING, ED
Glucose-Capillary: 131 mg/dL — ABNORMAL HIGH (ref 70–99)
Glucose-Capillary: 108 mg/dL — ABNORMAL HIGH (ref 70–99)
Glucose-Capillary: 131 mg/dL — ABNORMAL HIGH (ref 70–99)

## 2019-10-17 LAB — LACTIC ACID, PLASMA: Lactic Acid, Venous: 3.5 mmol/L (ref 0.5–1.9)

## 2019-10-17 LAB — PROTEIN, PLEURAL OR PERITONEAL FLUID: Total protein, fluid: <3 g/dL

## 2019-10-17 LAB — GLUCOSE, CAPILLARY: Glucose-Capillary: 113 mg/dL — ABNORMAL HIGH (ref 70–99)

## 2019-10-17 LAB — LIPASE, BLOOD: Lipase: 34 U/L (ref 11–51)

## 2019-10-17 IMAGING — US IR PARACENTESIS
1 series · 3 of 3 positions shown · non-contrast
Comparison: none

INDICATION: Patient with history of NASH cirrhosis, recurrent ascites. Request
is made for diagnostic and therapeutic paracentesis.

[Series 1: ir (id) (id)/(id)/(id) ir · 3 of 3 slices shown]
[im 1/3]
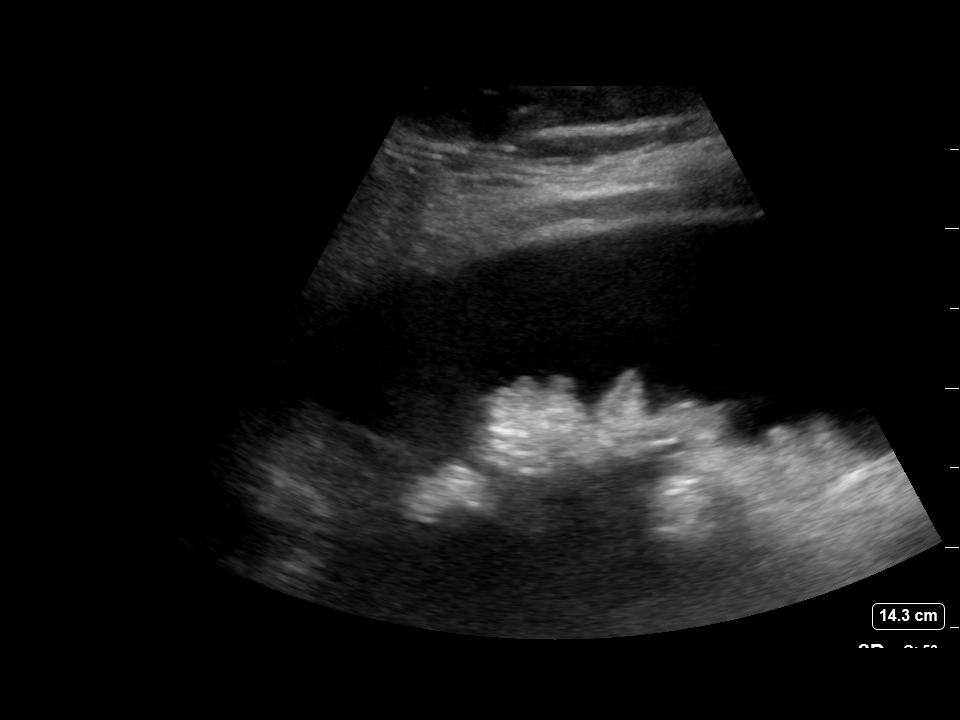
[im 2/3]
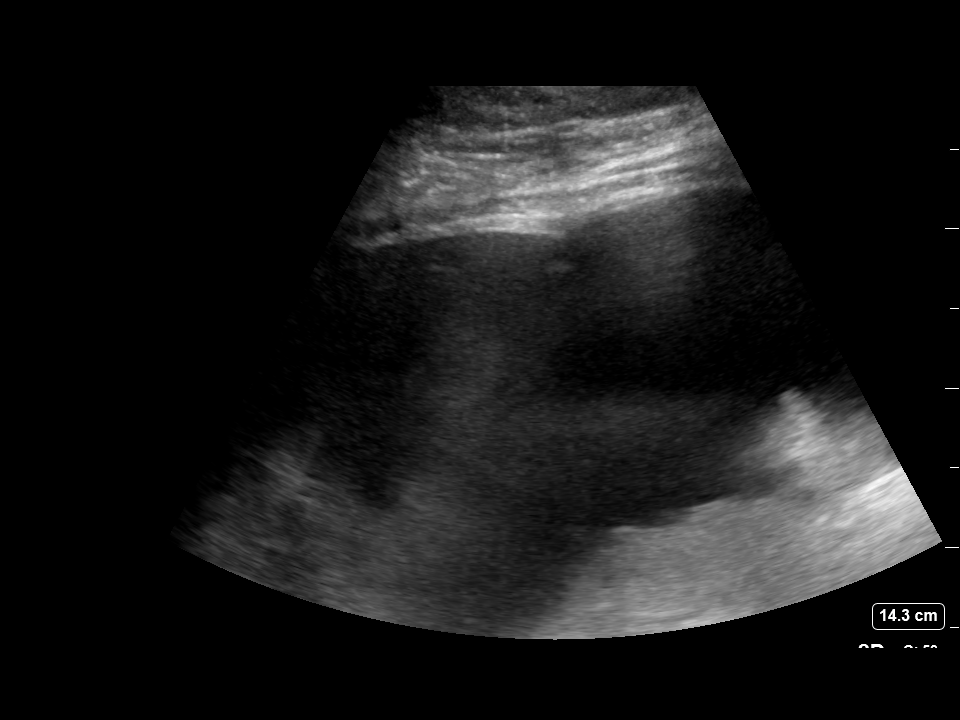
[im 3/3]
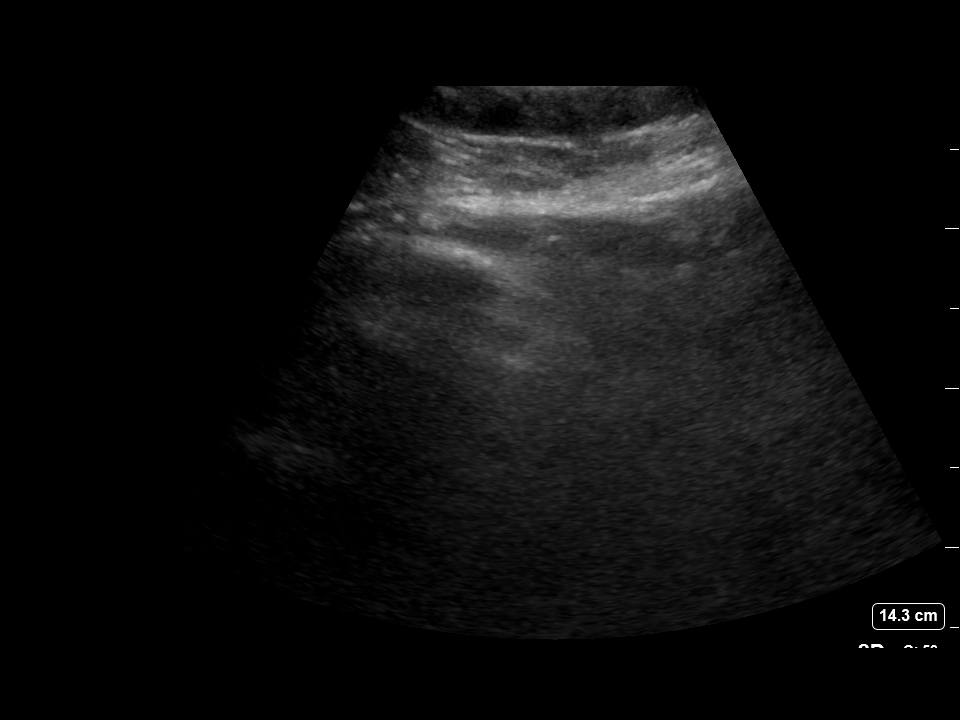

[3 of 3 positions shown; findings below may reference images not displayed]

EXAM:
ULTRASOUND GUIDED DIAGNOSTIC AND THERAPEUTIC PARACENTESIS

MEDICATIONS:
10 mL 1% lidocaine

COMPLICATIONS:
None immediate.

PROCEDURE:
Informed written consent was obtained from the patient after a
discussion of the risks, benefits and alternatives to treatment. A
timeout was performed prior to the initiation of the procedure.

Initial ultrasound scanning demonstrates a large amount of ascites
within the left lateral abdomen. The left lateral abdomen was
prepped and draped in the usual sterile fashion. 1% lidocaine was
used for local anesthesia.

Following this, a 19 gauge, 10-cm, Yueh catheter was introduced. An
ultrasound image was saved for documentation purposes. The
paracentesis was performed. The catheter was removed and a dressing
was applied. The patient tolerated the procedure well without
immediate post procedural complication.
Patient received post-procedure intravenous albumin; see nursing
notes for details.
FINDINGS: A total of approximately 8.4 liters of yellow fluid was removed.
Samples were sent to the laboratory as requested by the clinical
team.
IMPRESSION: Successful ultrasound-guided diagnostic and therapeutic paracentesis
yielding 8.4 liters of peritoneal fluid.

## 2019-10-17 IMAGING — CT CT ABD-PELV W/ CM
2 of 6 series · 16 of 46 positions shown, 18 images · IV contrast (Omni 300)
Comparison: [DATE]

CLINICAL DATA: Abdominal distension.

EXAM:
CT ABDOMEN AND PELVIS WITH CONTRAST
TECHNIQUE: Multidetector CT imaging of the abdomen and pelvis was performed
using the standard protocol following bolus administration of
intravenous contrast.
CONTRAST:  100mL OMNIPAQUE IOHEXOL 300 MG/ML  SOLN

[Series 4: a/p w/ 5mm · axial · 0.94mm/px · z∈[+760,+1260]mm · 13 of 114 slices shown, 15 images]
[im 7/114  soft-tissue]
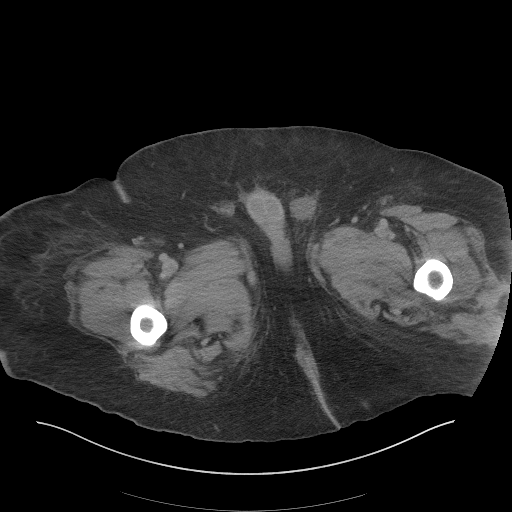
[im 7/114  bone]
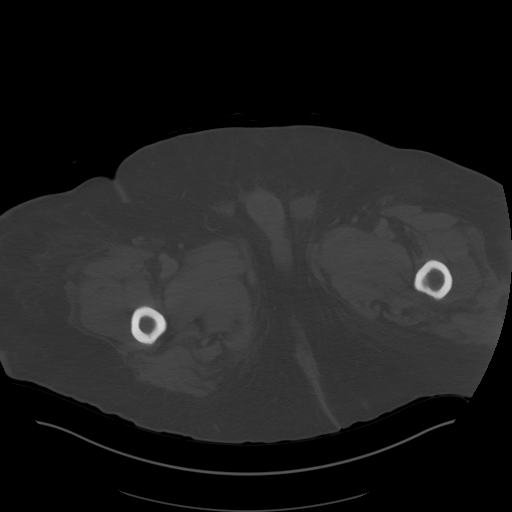
[im 13/114  soft-tissue]
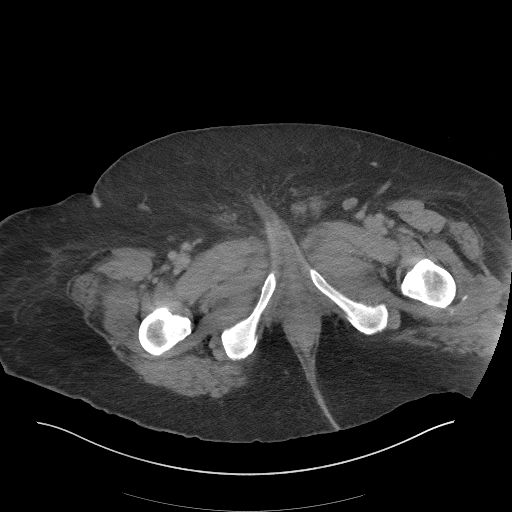
[im 26/114  soft-tissue]
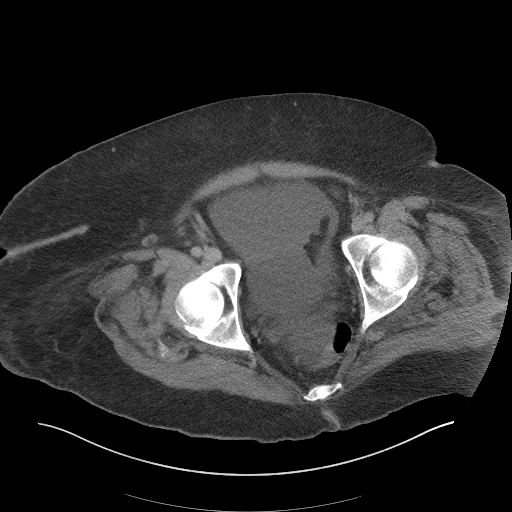
[im 32/114  soft-tissue]
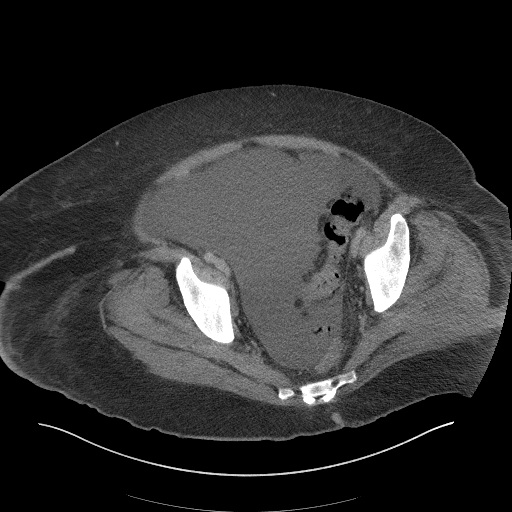
[im 38/114  soft-tissue]
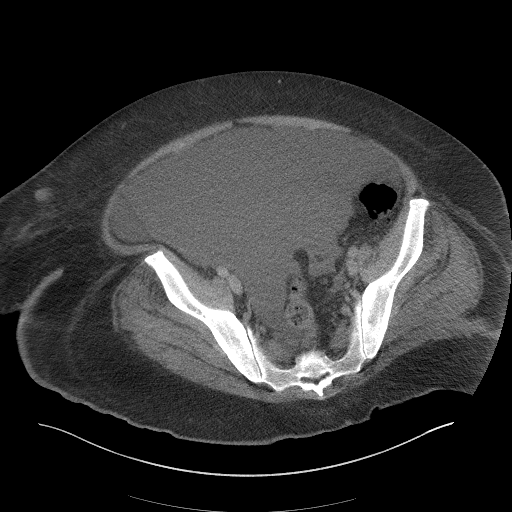
[im 51/114  soft-tissue]
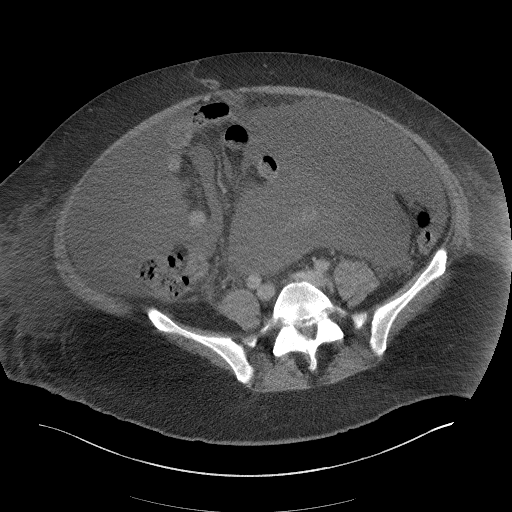
[im 57/114  soft-tissue]
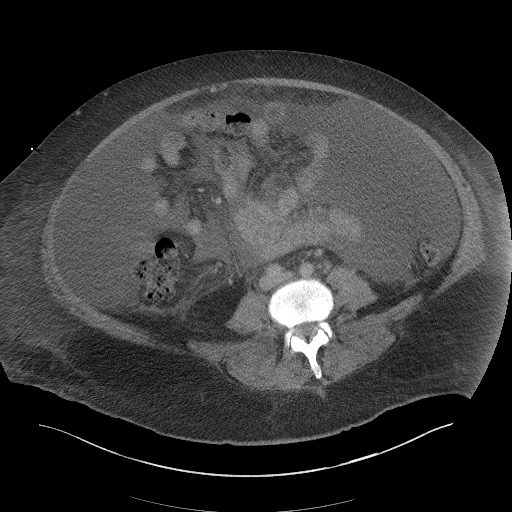
[im 63/114  soft-tissue]
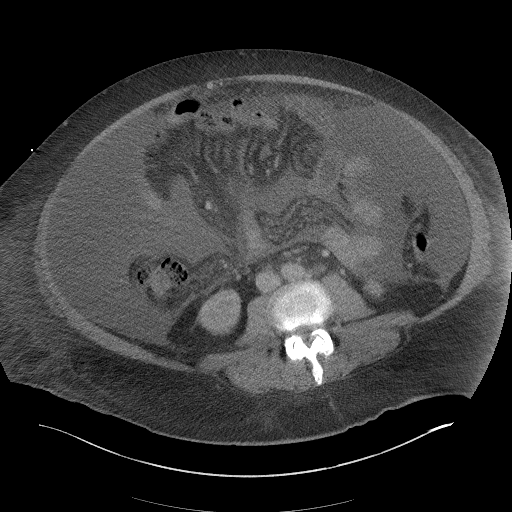
[im 76/114  soft-tissue]
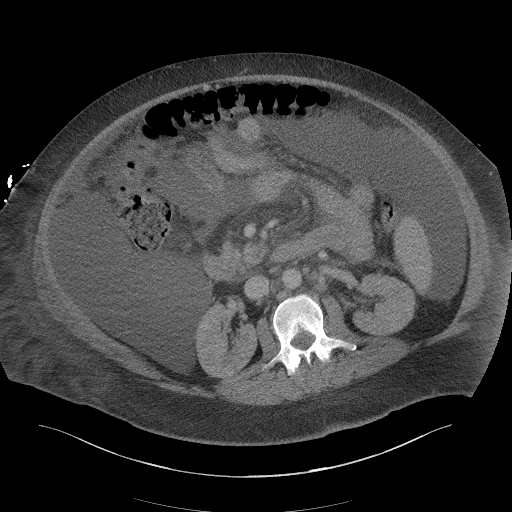
[im 76/114  bone]
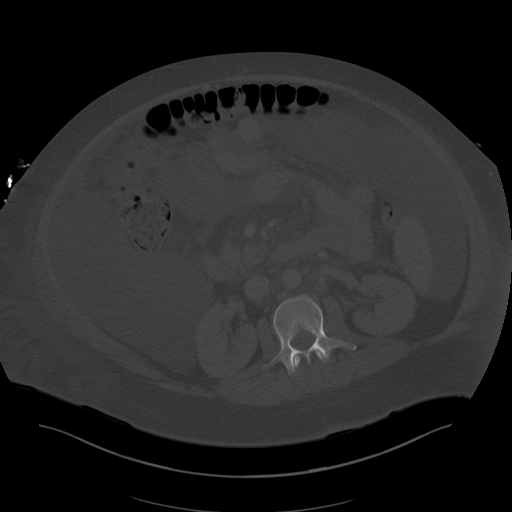
[im 82/114  soft-tissue]
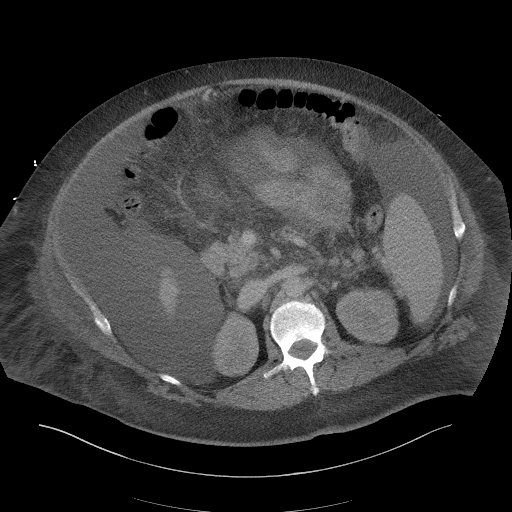
[im 88/114  soft-tissue]
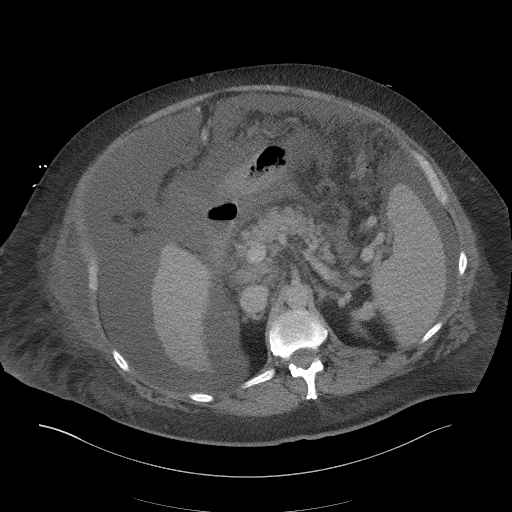
[im 101/114  soft-tissue]
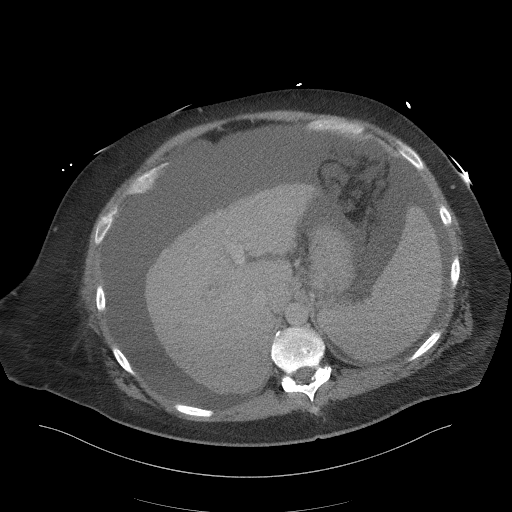
[im 107/114  soft-tissue]
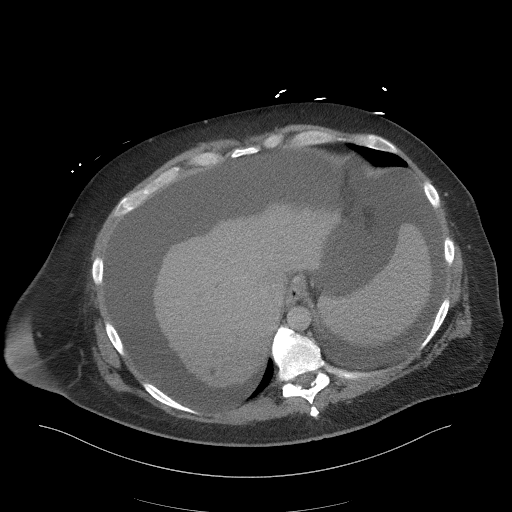

[Series 7: a/p w/ cor · coronal · 1.08mm/px · 3 of 195 slices shown]
[im 65/195  soft-tissue]
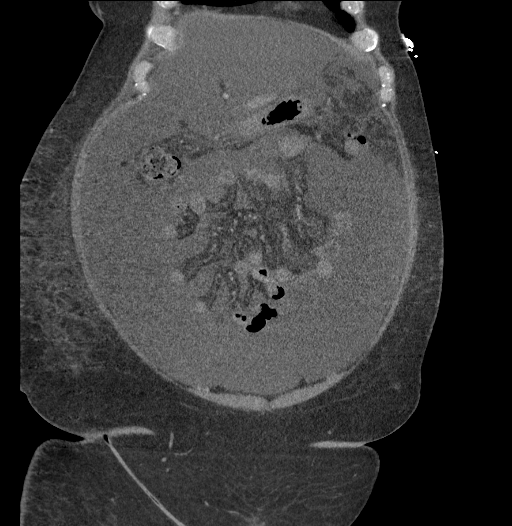
[im 87/195  soft-tissue]
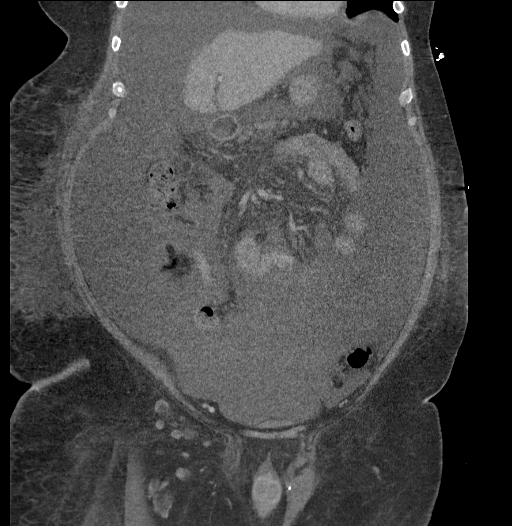
[im 108/195  soft-tissue]
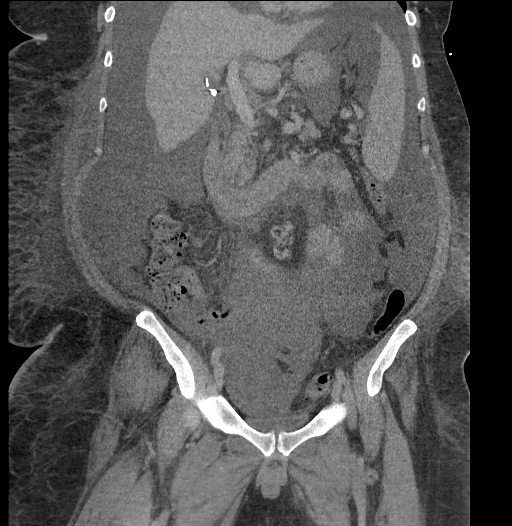

[16 of 46 positions shown; findings below may reference images not displayed]

FINDINGS: Lower chest: There is a small left-sided pleural effusion.The heart
size is normal.

Hepatobiliary: The liver is cirrhotic. There is a tiny hypodense
nodule at the dome of the liver (axial series 4, image 8). This is
favored to represent a small cyst. Patient is status post
cholecystectomy. There is no evidence for biliary ductal dilatation.

Pancreas: Normal contours without ductal dilatation. No
peripancreatic fluid collection.

Spleen: The spleen is significantly enlarged measuring approximately
18 cm craniocaudad.

Adrenals/Urinary Tract:

--Adrenal glands: Unremarkable.

--Right kidney/ureter: No hydronephrosis or radiopaque kidney
stones.

--Left kidney/ureter: No hydronephrosis or radiopaque kidney stones.

--Urinary bladder: Unremarkable.

Stomach/Bowel:

--Stomach/Duodenum: Esophageal varices are noted.

--Small bowel: Unremarkable.

--Colon: Unremarkable.

--Appendix: Normal.

Vascular/Lymphatic: There is no abdominal aortic aneurysm. The
portal vein is patent. The splenic vein is patent. The SMV is
patent. There is a splenorenal shunt.

--No retroperitoneal lymphadenopathy.

--No mesenteric lymphadenopathy.

--No pelvic or inguinal lymphadenopathy.

Reproductive: Unremarkable

Other: There is a large volume of abdominal ascites. The abdominal
wall is normal.

Musculoskeletal. No acute displaced fractures. The previously
demonstrated left hip abscess has essentially resolved. There is a
small residual pocket of gas in this location which may related to
prior intervention. There is no CT evidence for left hip
osteomyelitis.
IMPRESSION: 1. Cirrhosis with stigmata of portal hypertension.
2. Large volume abdominal ascites.
3. Small left-sided pleural effusion.

## 2019-10-17 MED ORDER — INSULIN ASPART 100 UNIT/ML ~~LOC~~ SOLN
0.0000 [IU] | Freq: Three times a day (TID) | SUBCUTANEOUS | Status: DC
  Administered 2019-10-17 (×2): 1 [IU] via SUBCUTANEOUS

## 2019-10-17 MED ORDER — FUROSEMIDE 40 MG PO TABS
40.0000 mg | ORAL_TABLET | Freq: Every day | ORAL | Status: DC
  Administered 2019-10-17 – 2019-10-18 (×2): 40 mg via ORAL
  Filled 2019-10-17: qty 1
  Filled 2019-10-17: qty 2

## 2019-10-17 MED ORDER — OXYCODONE HCL 5 MG PO TABS
5.0000 mg | ORAL_TABLET | ORAL | Status: DC | PRN
  Administered 2019-10-17 – 2019-10-18 (×2): 5 mg via ORAL
  Filled 2019-10-17 (×3): qty 1

## 2019-10-17 MED ORDER — FERROUS SULFATE 325 (65 FE) MG PO TABS
325.0000 mg | ORAL_TABLET | Freq: Every day | ORAL | Status: DC
  Administered 2019-10-17 – 2019-10-18 (×2): 325 mg via ORAL
  Filled 2019-10-17 (×2): qty 1

## 2019-10-17 MED ORDER — INSULIN GLARGINE 100 UNIT/ML ~~LOC~~ SOLN
9.0000 [IU] | Freq: Every day | SUBCUTANEOUS | Status: DC
  Administered 2019-10-17 – 2019-10-18 (×2): 9 [IU] via SUBCUTANEOUS
  Filled 2019-10-17 (×2): qty 0.09

## 2019-10-17 MED ORDER — SPIRONOLACTONE 100 MG PO TABS
100.0000 mg | ORAL_TABLET | Freq: Every day | ORAL | Status: DC
  Administered 2019-10-17 – 2019-10-18 (×2): 100 mg via ORAL
  Filled 2019-10-17 (×2): qty 1

## 2019-10-17 MED ORDER — POTASSIUM CHLORIDE CRYS ER 20 MEQ PO TBCR
20.0000 meq | EXTENDED_RELEASE_TABLET | Freq: Every day | ORAL | Status: DC
  Administered 2019-10-17 – 2019-10-18 (×2): 20 meq via ORAL
  Filled 2019-10-17 (×2): qty 1

## 2019-10-17 MED ORDER — ONDANSETRON HCL 4 MG/2ML IJ SOLN: 4.0000 mg | Freq: Four times a day (QID) | INTRAMUSCULAR | Status: DC | PRN

## 2019-10-17 MED ORDER — CIPROFLOXACIN HCL 500 MG PO TABS
500.0000 mg | ORAL_TABLET | Freq: Two times a day (BID) | ORAL | Status: DC
  Administered 2019-10-17 – 2019-10-18 (×3): 500 mg via ORAL
  Filled 2019-10-17 (×3): qty 1

## 2019-10-17 MED ORDER — LIDOCAINE HCL 1 % IJ SOLN
INTRAMUSCULAR | Status: DC | PRN
  Administered 2019-10-17: 10 mL

## 2019-10-17 MED ORDER — ONDANSETRON HCL 4 MG/2ML IJ SOLN
4.0000 mg | Freq: Once | INTRAMUSCULAR | Status: DC
  Filled 2019-10-17: qty 2

## 2019-10-17 MED ORDER — LIDOCAINE HCL 1 % IJ SOLN
INTRAMUSCULAR | Status: AC
  Filled 2019-10-17: qty 20

## 2019-10-17 MED ORDER — IOHEXOL 300 MG/ML  SOLN
100.0000 mL | Freq: Once | INTRAMUSCULAR | Status: AC | PRN
  Administered 2019-10-17: 100 mL via INTRAVENOUS

## 2019-10-17 MED ORDER — ONDANSETRON HCL 4 MG PO TABS: 4.0000 mg | ORAL_TABLET | Freq: Four times a day (QID) | ORAL | Status: DC | PRN

## 2019-10-17 MED ORDER — POLYETHYLENE GLYCOL 3350 17 G PO PACK: 17.0000 g | PACK | Freq: Every day | ORAL | Status: DC | PRN

## 2019-10-17 MED ORDER — METHOCARBAMOL 500 MG PO TABS
500.0000 mg | ORAL_TABLET | Freq: Four times a day (QID) | ORAL | Status: DC | PRN
  Administered 2019-10-17: 500 mg via ORAL
  Filled 2019-10-17: qty 1

## 2019-10-17 MED ORDER — ALBUMIN HUMAN 25 % IV SOLN
25.0000 g | Freq: Four times a day (QID) | INTRAVENOUS | Status: AC
  Administered 2019-10-17 – 2019-10-18 (×4): 25 g via INTRAVENOUS
  Filled 2019-10-17 (×4): qty 100

## 2019-10-17 MED ORDER — ENSURE MAX PROTEIN PO LIQD
11.0000 [oz_av] | Freq: Every day | ORAL | Status: DC
  Filled 2019-10-17: qty 330

## 2019-10-17 MED ORDER — MORPHINE SULFATE (PF) 4 MG/ML IV SOLN
4.0000 mg | Freq: Once | INTRAVENOUS | Status: AC
  Administered 2019-10-17: 4 mg via INTRAVENOUS
  Filled 2019-10-17: qty 1

## 2019-10-17 MED ORDER — ADULT MULTIVITAMIN W/MINERALS CH
1.0000 | ORAL_TABLET | Freq: Every day | ORAL | Status: DC
  Administered 2019-10-17 – 2019-10-18 (×2): 1 via ORAL
  Filled 2019-10-17 (×2): qty 1

## 2019-10-17 NOTE — H&P (Signed)
History and Physical    Robert Sullivan QDI:[id number] DOB: [birth year] DOA: 10/16/2019  PCP: Cathleen Corti, PA-C  Patient coming from: Home.  Chief Complaint: Increasing abdominal distention.  HPI: Robert Sullivan is a 54 y.o. male with history of cirrhosis secondary to nonalcoholic steatohepatitis, sleep apnea on CPAP who was recently discharged from the hospital after being admitted for wound dehiscence.  Patient has had multiple surgeries for the left gluteal abscess and wound.  Patient also had undergone shock liver and also had required paracentesis for large volume ascites.  Patient states after discharge patient has noted that he has put on at least 20 pounds and has got increasing abdominal girth with difficulty breathing.  Denies any fever chills nausea vomiting or diarrhea.  Has been taking Cipro for the left gluteal wound.  ED Course: In the ER patient was not hypoxic on exam patient has significant bilateral lower extremity edema and also abdominal distention.  Abdomen appeared benign on exam.  CT abdomen pelvis shows large volume ascites.  Chest x-ray unremarkable.  Covid test negative.  Labs show AST 101 ALT 107 albumin 2.4 blood glucose 181 creatinine 1.2 WBC 13.7 lactic acid 3.5 hemoglobin 10.6.  Patient was given Lasix in the ER and admitted for further management of ascites.  Review of Systems: As per HPI, rest all negative.   Past Medical History:  Diagnosis Date  . Anemia   . Anxiety   . Arthritis 05/13/2019   knees  . Cirrhosis ([place])   . Depression   . Diabetes mellitus without complication ([place])   . DVT (deep venous thrombosis) ([place])   . Gallstones   . Gluteal abscess   . Headache   . HTN (hypertension)   . Hyperlipidemia   . Sleep apnea    doesn't use cpap  . Splenomegaly     Past Surgical History:  Procedure Laterality Date  . I & D EXTREMITY Left 07/16/2019   Procedure: LEFT HIP DEBRIDEMENT;  Surgeon: Newt Minion, MD;  Location: [facility];  Service:  Orthopedics;  Laterality: Left;  . I & D EXTREMITY Left 07/18/2019   Procedure: REPEAT IRRIGATION AND DEBRIDEMENT LEFT HIP;  Surgeon: Newt Minion, MD;  Location: [facility];  Service: Orthopedics;  Laterality: Left;  . I & D EXTREMITY Left 09/19/2019   Procedure: DEBRIDEMENT LEFT HIP Appil[place]ion OF WOUND VAC;  Surgeon: Newt Minion, MD;  Location: [facility];  Service: Orthopedics;  Laterality: Left;  . I & D EXTREMITY Left 10/08/2019   Procedure: IRRIGATION AND DEBRIDEMENT, REVISION LEFT HIP WOUND;  Surgeon: Newt Minion, MD;  Location: [facility];  Service: Orthopedics;  Laterality: Left;  . INCISION AND DRAINAGE ABSCESS Left 06/23/2019   Procedure: INCISION AND DRAINAGE GLUTEAL ABSCESS;  Surgeon: Renette Butters, MD;  Location: [facility];  Service: Orthopedics;  Laterality: Left;  . INCISION AND DRAINAGE ABSCESS Left 07/09/2019   Procedure: INCISION AND DRAINAGE LEFT HIP/PELVIC ABSCESS, APPLICATION OF NEGATIVE PRESSURE WOUND VAC;  Surgeon: Newt Minion, MD;  Location: [facility];  Service: Orthopedics;  Laterality: Left;  . INCISION AND DRAINAGE HIP Left 06/25/2019   Procedure: IRRIGATION AND DEBRIDEMENT HIP;  Surgeon: Shona Needles, MD;  Location: [facility];  Service: Orthopedics;  Laterality: Left;  . INCISION AND DRAINAGE HIP Left 10/11/2019   Procedure: IRRIGATION AND DEBRIDEMENT HIP;  Surgeon: Newt Minion, MD;  Location: [facility];  Service: Orthopedics;  Laterality: Left;  . INCISION AND DRAINAGE OF WOUND Left 07/12/2019  Procedure: LEFT HIP DEBRIDEMENT;  Surgeon: Newt Minion, MD;  Location: [facility];  Service: Orthopedics;  Laterality: Left;  . IR PARACENTESIS  09/13/2019  . IR US GUIDE BX ASP/DRAIN  06/17/2019  . IR US GUIDE BX ASP/DRAIN  09/13/2019  . KNEE ARTHROSCOPY Bilateral    left x 2, 1 right  . LAPAROSCOPIC CHOLECYSTECTOMY  ~ 2010  . LUMBAR DISC SURGERY    . NASAL SINUS SURGERY    . TEE WITHOUT CARDIOVERSION N/A 06/16/2019   Procedure: TRANSESOPHAGEAL ECHOCARDIOGRAM (TEE);  Surgeon: Dorothy Spark, MD;  Location: [facility] END[facility];  Service: Cardiovascular;  Laterality: N/A;     reports that he quit smoking about 2 years ago. His smoking use included cigars. He has never used smokeless tobacco. He reports previous alcohol use. He reports previous drug use.  Allergies  Allergen Reactions  . Sulfa Antibiotics Rash and Other (See Comments)    Total body rash    Family History  Problem Relation Age of Onset  . Hypertension Father     Prior to Admission medications   Medication Sig Start Date End Date Taking? Authorizing Provider  aspirin 325 MG tablet Take 1 tablet (325 mg total) by mouth daily. Patient taking differently: Take 325 mg by mouth as needed for mild pain or headache.  07/25/19   Persons, Bevely Palmer, PA  blood glucose meter kit and supplies KIT Dispense based on patient and insurance preference. Use up to four times daily as directed. (FOR ICD-9 250.00, 250.01). 06/30/19   Hongalgi, Lenis Dickinson, MD  ciprofloxacin (CIPRO) 500 MG tablet Take 1 tablet (500 mg total) by mouth 2 (two) times daily for 18 days. continue antibiotics through 11/01/19 10/14/19 11/01/19  Domenic Polite, MD  Ensure Max Protein (ENSURE MAX PROTEIN) LIQD Take 330 mLs (11 oz total) by mouth daily. 07/01/19   Hongalgi, Lenis Dickinson, MD  ferrous sulfate 325 (65 FE) MG tablet Take 1 tablet (325 mg total) by mouth daily with breakfast. 09/24/19 10/24/19  Cherylann Ratel A, DO  furosemide (LASIX) 40 MG tablet Take 1 tablet (40 mg total) by mouth daily. 09/24/19 10/24/19  Cherylann Ratel A, DO  insulin aspart (NOVOLOG) 100 UNIT/ML injection Inject 1 Units into the skin 2 (two) times daily as needed (only if BGL is 120 or greater).     [provider]  insulin glargine (LANTUS) 100 UNIT/ML injection Inject 0.15 mLs (15 Units total) into the skin daily as needed (only if BGL is 120 or greater). 10/14/19   Domenic Polite, MD  Insulin Syringes, Disposable, U-100 0.5 ML MISC Use as directed 3 times daily AC. 06/30/19   Hongalgi, Lenis Dickinson, MD  methocarbamol (ROBAXIN) 500 MG tablet Take 1 tablet (500 mg total) by mouth every 6 (six) hours as needed for muscle spasms. 10/14/19   Domenic Polite, MD  Multiple Vitamin (MULTIVITAMIN WITH MINERALS) TABS tablet Take 1 tablet by mouth daily. 07/01/19   Hongalgi, Lenis Dickinson, MD  oxyCODONE (OXY IR/ROXICODONE) 5 MG immediate release tablet Take 1 tablet (5 mg total) by mouth every 4 (four) hours as needed for moderate pain (pain score 4-6). 10/15/19   Persons, Bevely Palmer, PA  polyethylene glycol (MIRALAX / GLYCOLAX) 17 g packet Take 17 g by mouth daily as needed for mild constipation. 10/14/19   Domenic Polite, MD  potassium chloride SA (KLOR-CON) 20 MEQ tablet Take 1 tablet (20 mEq total) by mouth daily. 10/14/19 11/13/19  Domenic Polite, MD  spironolactone (ALDACTONE) 100 MG tablet Take  1 tablet (100 mg total) by mouth daily. 09/25/19 10/25/19  Jonnie Finner, DO    Physical Exam: Constitutional: Moderately built and nourished. Vitals:   10/17/19 0200 10/17/19 0306 10/17/19 0315 10/17/19 0400  BP:  115/67 125/83 112/70  Pulse: (!) 114 96 (!) 101 99  Resp: (!) _0 Temp:      TempSrc:      SpO2: 99% 97% 97% 96%  Weight:      Height:       Eyes: Anicteric no pallor. ENMT: No discharge from the ears eyes nose or mouth. Neck: No mass felt.  No neck rigidity. Respiratory: No rhonchi or crepitations. Cardiovascular: S1-S2 heard. Abdomen: Soft distended nontender bowel sounds present. Musculoskeletal: Bilateral lower extremity edema extending up to the thigh. Skin: Chronic skin changes. Neurologic: Alert awake oriented time place and person.  Moves all extremities. Psychiatric: Appears normal.   Labs on Admission: I have personally reviewed following labs and imaging studies  CBC: Recent Labs  Lab 10/11/19 0414 10/11/19 0414 10/12/19 0520 10/12/19 1758 10/13/19 0620 10/14/19 0623 10/16/19 2316  WBC 5.8  --  4.7  --  6.5 8.5 13.7*  NEUTROABS  --   --  2.9  --  3.7  --  9.4*    HGB 7.3*   < > 7.1* 9.6* 9.2* 9.3* 10.6*  HCT 22.1*   < > 22.5* 28.8* 28.6* 28.9* 32.9*  MCV 87.7  --  89.3  --  90.8 90.6 91.9  PLT 73*  --  73*  --  94* 98* 157   < > = values in this interval not displayed.   Basic Metabolic Panel: Recent Labs  Lab 10/10/19 0831 10/11/19 0414 10/13/19 0620 10/14/19 0623 10/16/19 2316  NA 135 134* 136 134* 132*  K 5.0 3.9 3.6 3.8 3.6  CL 106 103 104 102 101  CO2 21* _1 19*  GLUCOSE 172* 263* 139* 160* 181*  BUN 31* 32* 27* 29* 27*  CREATININE 2.56* 1.69* 0.99 0.95 1.22  CALCIUM 7.9* 8.0* 8.2* 8.4* 8.5*  PHOS  --  2.8  --   --   --    GFR: Estimated Creatinine Clearance: 113.1 mL/min (by C-G formula based on SCr of 1.22 mg/dL). Liver Function Tests: Recent Labs  Lab 10/10/19 0831 10/11/19 0414 10/13/19 0620 10/14/19 0623 10/16/19 2316  AST 1,944* 995* 369* 195* 101*  ALT 684* 551* 280* 206* 107*  ALKPHOS 105 117 138* 136* 166*  BILITOT 2.5* 2.2* 1.5* 1.2 1.4*  PROT 4.3* 4.8* 5.5* 5.6* 6.2*  ALBUMIN 1.9* 2.2* 2.3* 2.3* 2.4*   Recent Labs  Lab 10/16/19 2316  LIPASE 34   Recent Labs  Lab 10/16/19 2313  AMMONIA 50*   Coagulation Profile: Recent Labs  Lab 10/10/19 0831 10/10/19 2135 10/11/19 0414 10/16/19 2316  INR 2.3* 2.1* 2.1* 1.4*   Cardiac Enzymes: No results for input(s): CKTOTAL, CKMB, CKMBINDEX, TROPONINI in the last 168 hours. BNP (last 3 results) No results for input(s): PROBNP in the last 8760 hours. HbA1C: No results for input(s): HGBA1C in the last 72 hours. CBG: Recent Labs  Lab 10/13/19 1138 10/13/19 1717 10/13/19 2147 10/14/19 0819 10/14/19 1223  GLUCAP 150* 150* 144* 169* 199*   Lipid Profile: No results for input(s): CHOL, HDL, LDLCALC, TRIG, CHOLHDL, LDLDIRECT in the last 72 hours. Thyroid Function Tests: No results for input(s): TSH, T4TOTAL, FREET4, T3FREE, THYROIDAB in the last 72 hours. Anemia Panel: No results for input(s): VITAMINB12, FOLATE,  FERRITIN, TIBC, IRON, RETICCTPCT  in the last 72 hours. Urine analysis:    Component Value Date/Time   COLORURINE YELLOW 10/17/2019 0110   APPEARANCEUR HAZY (A) 10/17/2019 0110   LABSPEC 1.010 10/17/2019 0110   PHURINE 6.0 10/17/2019 0110   GLUCOSEU NEGATIVE 10/17/2019 0110   HGBUR NEGATIVE 10/17/2019 0110   BILIRUBINUR NEGATIVE 10/17/2019 0110   KETONESUR NEGATIVE 10/17/2019 0110   PROTEINUR NEGATIVE 10/17/2019 0110   NITRITE NEGATIVE 10/17/2019 0110   LEUKOCYTESUR MODERATE (A) 10/17/2019 0110   Sepsis Labs: _0 (procalcitonin:4,lacticidven:4) ) Recent Results (from the past 240 hour(s))  Respiratory Panel by RT PCR (Flu A&B, Covid) - Nasopharyngeal Swab     Status: None   Collection Time: 10/08/19 11:41 AM   Specimen: Nasopharyngeal Swab  Result Value Ref Range Status   SARS Coronavirus 2 by RT PCR NEGATIVE NEGATIVE Final    Comment: (NOTE) SARS-CoV-2 target nucleic acids are NOT DETECTED. The SARS-CoV-2 RNA is generally detectable in upper respiratoy specimens during the acute phase of infection. The lowest concentration of SARS-CoV-2 viral copies this assay can detect is 131 copies/mL. A negative result does not preclude SARS-Cov-2 infection and should not be used as the sole basis for treatment or other patient management decisions. A negative result may occur with  improper specimen collection/handling, submission of specimen other than nasopharyngeal swab, presence of viral mutation(s) within the areas targeted by this assay, and inadequate number of viral copies (<131 copies/mL). A negative result must be combined with clinical observations, patient history, and epidemiological information. The expected result is Negative. Fact Sheet for Patients:  PinkCheek.be Fact Sheet for Healthcare Providers:  GravelBags.it This test is not yet ap proved or cleared by the [place] FDA and  has been authorized for detection and/or diagnosis of  SARS-CoV-2 by FDA under an Emergency Use Authorization (EUA). This EUA will remain  in effect (meaning this test can be used) for the duration of the COVID-19 declaration under Section 564(b)(1) of the Act, 21 U.S.C. section 360bbb-3(b)(1), unless the authorization is terminated or revoked sooner.    Influenza A by PCR NEGATIVE NEGATIVE Final   Influenza B by PCR NEGATIVE NEGATIVE Final    Comment: (NOTE) The Xpert Xpress SARS-CoV-2/FLU/RSV assay is intended as an aid in  the diagnosis of influenza from Nasopharyngeal swab specimens and  should not be used as a sole basis for treatment. Nasal washings and  aspirates are unacceptable for Xpert Xpress SARS-CoV-2/FLU/RSV  testing. Fact Sheet for Patients: PinkCheek.be Fact Sheet for Healthcare Providers: GravelBags.it This test is not yet approved or cleared by the [place] FDA and  has been authorized for detection and/or diagnosis of SARS-CoV-2 by  FDA under an Emergency Use Authorization (EUA). This EUA will remain  in effect (meaning this test can be used) for the duration of the  Covid-19 declaration under Section 564(b)(1) of the Act, 21  U.S.C. section 360bbb-3(b)(1), unless the authorization is  terminated or revoked. Performed at [facility], [place] [address] [place], [place] [address]   Aerobic/Anaerobic Culture (surgical/deep wound)     Status: None   Collection Time: 10/08/19  1:20 PM   Specimen: Wound  Result Value Ref Range Status   Specimen Description WOUND  Final   Special Requests LEFT HIP  Final   Gram Stain   Final    ABUNDANT WBC PRESENT,BOTH PMN AND MONONUCLEAR NO ORGANISMS SEEN    Culture   Final    MODERATE ENTEROBACTER SPECIES NO ANAEROBES ISOLATED Performed at  [facility], [place] [address], [place], [place] [address]    Report Status 10/13/2019 FINAL  Final   Organism ID, Bacteria ENTEROBACTER SPECIES  Final       Susceptibility   Enterobacter species - MIC*    CEFAZOLIN >=64 RESISTANT Resistant     CEFEPIME <=0.12 SENSITIVE Sensitive     CEFTAZIDIME <=1 SENSITIVE Sensitive     CIPROFLOXACIN <=0.25 SENSITIVE Sensitive     GENTAMICIN <=1 SENSITIVE Sensitive     IMIPENEM <=0.25 SENSITIVE Sensitive     TRIMETH/SULFA <=20 SENSITIVE Sensitive     PIP/TAZO <=4 SENSITIVE Sensitive     * MODERATE ENTEROBACTER SPECIES  Culture, blood (routine x 2)     Status: None   Collection Time: 10/10/19  1:50 AM   Specimen: BLOOD LEFT HAND  Result Value Ref Range Status   Specimen Description BLOOD LEFT HAND  Final   Special Requests   Final    BOTTLES DRAWN AEROBIC AND ANAEROBIC Blood Culture results may not be optimal due to an excessive volume of blood received in culture bottles   Culture   Final    NO GROWTH 5 DAYS Performed at [facility], [place] [address], [place], [place] [address]    Report Status 10/15/2019 FINAL  Final  Culture, blood (routine x 2)     Status: None   Collection Time: 10/10/19  2:46 AM   Specimen: BLOOD  Result Value Ref Range Status   Specimen Description BLOOD LEFT WRIST  Final   Special Requests   Final    BOTTLES DRAWN AEROBIC AND ANAEROBIC Blood Culture adequate volume   Culture   Final    NO GROWTH 5 DAYS Performed at [facility], [place] [address] [place], [place] [address]    Report Status 10/15/2019 FINAL  Final  Surgical pcr screen     Status: None   Collection Time: 10/10/19  8:16 AM   Specimen: Nasal Mucosa; Nasal Swab  Result Value Ref Range Status   MRSA, PCR NEGATIVE NEGATIVE Final   Staphylococcus aureus NEGATIVE NEGATIVE Final    Comment: (NOTE) The Xpert SA Assay (FDA approved for NASAL specimens in patients 24 years of age and older), is one component of a comprehensive surveillance program. It is not intended to diagnose infection nor to guide or monitor treatment. Performed at [facility], [place] [address] [place], [place] [address]     SARS CORONAVIRUS 2 (TAT 6-24 HRS) Nasopharyngeal Nasopharyngeal Swab     Status: None   Collection Time: 10/17/19 12:03 AM   Specimen: Nasopharyngeal Swab  Result Value Ref Range Status   SARS Coronavirus 2 NEGATIVE NEGATIVE Final    Comment: (NOTE) SARS-CoV-2 target nucleic acids are NOT DETECTED. The SARS-CoV-2 RNA is generally detectable in upper and lower respiratory specimens during the acute phase of infection. Negative results do not preclude SARS-CoV-2 infection, do not rule out co-infections with other pathogens, and should not be used as the sole basis for treatment or other patient management decisions. Negative results must be combined with clinical observations, patient history, and epidemiological information. The expected result is Negative. Fact Sheet for Patients: SugarRoll.be Fact Sheet for Healthcare Providers: https://www.woods-mathews.com/ This test is not yet approved or cleared by the [place] FDA and  has been authorized for detection and/or diagnosis of SARS-CoV-2 by FDA under an Emergency Use Authorization (EUA). This EUA will remain  in effect (meaning this test can be used) for the duration of  the COVID-19 declaration under Section 56 4(b)(1) of the Act, 21 U.S.C. section 360bbb-3(b)(1), unless the authorization is terminated or revoked sooner. Performed at [facility], [place] [address], [place], [place] [address]      Radiological Exams on Admission: CT ABDOMEN PELVIS W CONTRAST  Result Date: 10/17/2019 CLINICAL DATA:  Abdominal distension. EXAM: CT ABDOMEN AND PELVIS WITH CONTRAST TECHNIQUE: Multidetector CT imaging of the abdomen and pelvis was performed using the standard protocol following bolus administration of intravenous contrast. CONTRAST:  171m OMNIPAQUE IOHEXOL 300 MG/ML  SOLN COMPARISON:  September 12, 2019 FINDINGS: Lower chest: There is a small left-sided pleural effusion.The heart size is  normal. Hepatobiliary: The liver is cirrhotic. There is a tiny hypodense nodule at the dome of the liver (axial series 4, image 8). This is favored to represent a small cyst. Patient is status post cholecystectomy. There is no evidence for biliary ductal dilatation. Pancreas: Normal contours without ductal dilatation. No peripancreatic fluid collection. Spleen: The spleen is significantly enlarged measuring approximately 18 cm craniocaudad. Adrenals/Urinary Tract: --Adrenal glands: Unremarkable. --Right kidney/ureter: No hydronephrosis or radiopaque kidney stones. --Left kidney/ureter: No hydronephrosis or radiopaque kidney stones. --Urinary bladder: Unremarkable. Stomach/Bowel: --Stomach/Duodenum: Esophageal varices are noted. --Small bowel: Unremarkable. --Colon: Unremarkable. --Appendix: Normal. Vascular/Lymphatic: There is no abdominal aortic aneurysm. The portal vein is patent. The splenic vein is patent. The SMV is patent. There is a splenorenal shunt. --No retroperitoneal lymphadenopathy. --No mesenteric lymphadenopathy. --No pelvic or inguinal lymphadenopathy. Reproductive: Unremarkable Other: There is a large volume of abdominal ascites. The abdominal wall is normal. Musculoskeletal. No acute displaced fractures. The previously demonstrated left hip abscess has essentially resolved. There is a small residual pocket of gas in this location which may related to prior intervention. There is no CT evidence for left hip osteomyelitis. IMPRESSION: 1. Cirrhosis with stigmata of portal hypertension. 2. Large volume abdominal ascites. 3. Small left-sided pleural effusion. Electronically Signed   By: CConstance HolsterM.D.   On: 10/17/2019 02:46   DG Chest Port 1 View  Result Date: 10/16/2019 CLINICAL DATA:  Shortness of breath. EXAM: PORTABLE CHEST 1 VIEW COMPARISON:  None. FINDINGS: Decreased lung volumes are seen likely secondary to suboptimal patient inspiration. There is no evidence of acute infiltrate,  pleural effusion or pneumothorax. The heart size and mediastinal contours are within normal limits. Multilevel degenerative changes seen throughout the thoracic spine. IMPRESSION: No active disease. Electronically Signed   By: TVirgina NorfolkM.D.   On: 10/16/2019 23:37     Assessment/Plan Principal Problem:   Decompensated hepatic cirrhosis (HCC) Active Problems:   Type 2 diabetes mellitus without complication (HCC)   Ascites    1. Decompensated liver cirrhosis with large ascites for which I have ordered albumin infusion and will get ultrasound-guided paracentesis.  Check studies for cell count and differential and Gram stain.  Patient is already on Cipro for his left gluteal wound.  Patient is also on Lasix and spironolactone which will be continued. 2. Diabetes mellitus type 2 on Lantus insulin 9 units with sliding scale coverage. 3. Recurrent left gluteal wound infection on Cipro at this time.  Has had recent surgery. 4. Sleep apnea on CPAP. 5. Anemia of chronic disease follow CBC. 6. Elevated LFTs appears to be stable at this time.  No other patient has had recent shock liver.   DVT prophylaxis: SCDs for now.  Change to Lovenox once paracentesis done. Code Status: Full code. Family Communication: Discussed with patient. Disposition Plan: Home. Consults called: None. Admission  status: Inpatient.   Rise Patience MD [facility] Pager [phone].  If 7PM-7AM, please contact night-coverage [email or web address] Password [facility]  10/17/2019, 5:42 AM

## 2019-10-17 NOTE — Procedures (Signed)
PROCEDURE SUMMARY:  Successful US guided paracentesis from left lateral abdomen.  Yielded 8.4 liters of yellow fluid.  No immediate complications.  Pt tolerated well.   Specimen was sent for labs.  EBL < 78m  KDocia BarrierPA-C 10/17/2019 11:34 AM

## 2019-10-17 NOTE — ED Notes (Signed)
Tele

## 2019-10-17 NOTE — Progress Notes (Signed)
Patient ID: Robert Sullivan, male   DOB: June 14, 1966, 54 y.o.   MRN: [id number] Patient admitted early this morning for increasing abdominal distention and CT abdomen pelvis showed large volume ascites.  Ultrasound-guided paracentesis has been ordered which is pending.  I have seen and examined the patient at bedside and discussed the plan of care.  I reviewed patient's medical records including this morning's H&P, current vitals, labs and medications myself.  I have notified Dr. Sharol Given patient's admission as well.  Repeat a.m. labs.

## 2019-10-17 NOTE — Consult Note (Signed)
Referring Provider: [facility] Primary Care Physician:  Cathleen Corti, PA-C Primary Gastroenterologist:  Dr. Watt Climes [facility])  Reason for Consultation: Cirrhosis  HPI: Robert Sullivan is a 54 y.o. male who was recently underwent multiple surgical procedures for chronic wound on his left leg and with a history of NASH cirrhosis presenting with worsening ascites.  Patient noted worsening of abdominal distention, bilateral lower extremity edema, orthopnea, and shortness of breath with exertion over the past several days.  He also noted an increase in weight of 25 pounds during his last hospitalization.  Patient had a paracentesis today with removal of 8.4 L.    Currently, he does not have any abdominal pain or discomfort, though he did note abdominal discomfort prior to paracentesis.  He has not noticed any nausea, vomiting, hematemesis, changes in stool, melena, hematochezia.  Denies any alcohol use.  No family history of liver disease or gastrointestinal malignancies.  He has never had an EGD or colonoscopy.   Past Medical History:  Diagnosis Date  . Anemia   . Anxiety   . Arthritis 05/13/2019   knees  . Cirrhosis ([place])   . Depression   . Diabetes mellitus without complication ([place])   . DVT (deep venous thrombosis) ([place])   . Gallstones   . Gluteal abscess   . Headache   . HTN (hypertension)   . Hyperlipidemia   . Sleep apnea    doesn't use cpap  . Splenomegaly     Past Surgical History:  Procedure Laterality Date  . I & D EXTREMITY Left 07/16/2019   Procedure: LEFT HIP DEBRIDEMENT;  Surgeon: Newt Minion, MD;  Location: [place];  Service: Orthopedics;  Laterality: Left;  . I & D EXTREMITY Left 07/18/2019   Procedure: REPEAT IRRIGATION AND DEBRIDEMENT LEFT HIP;  Surgeon: Newt Minion, MD;  Location: [place];  Service: Orthopedics;  Laterality: Left;  . I & D EXTREMITY Left 09/19/2019   Procedure: DEBRIDEMENT LEFT HIP [place] OF WOUND VAC;  Surgeon: Newt Minion,  MD;  Location: [place];  Service: Orthopedics;  Laterality: Left;  . I & D EXTREMITY Left 10/08/2019   Procedure: IRRIGATION AND DEBRIDEMENT, REVISION LEFT HIP WOUND;  Surgeon: Newt Minion, MD;  Location: [place];  Service: Orthopedics;  Laterality: Left;  . INCISION AND DRAINAGE ABSCESS Left 06/23/2019   Procedure: INCISION AND DRAINAGE GLUTEAL ABSCESS;  Surgeon: Renette Butters, MD;  Location: [place];  Service: Orthopedics;  Laterality: Left;  . INCISION AND DRAINAGE ABSCESS Left 07/09/2019   Procedure: INCISION AND DRAINAGE LEFT HIP/PELVIC ABSCESS, APPLICATION OF NEGATIVE PRESSURE WOUND VAC;  Surgeon: Newt Minion, MD;  Location: [place];  Service: Orthopedics;  Laterality: Left;  . INCISION AND DRAINAGE HIP Left 06/25/2019   Procedure: IRRIGATION AND DEBRIDEMENT HIP;  Surgeon: Shona Needles, MD;  Location: [place];  Service: Orthopedics;  Laterality: Left;  . INCISION AND DRAINAGE HIP Left 10/11/2019   Procedure: IRRIGATION AND DEBRIDEMENT HIP;  Surgeon: Newt Minion, MD;  Location: [place];  Service: Orthopedics;  Laterality: Left;  . INCISION AND DRAINAGE OF WOUND Left 07/12/2019   Procedure: LEFT HIP DEBRIDEMENT;  Surgeon: Newt Minion, MD;  Location: [place];  Service: Orthopedics;  Laterality: Left;  . IR PARACENTESIS  09/13/2019  . IR PARACENTESIS  10/17/2019  . IR US GUIDE BX ASP/DRAIN  06/17/2019  . IR US GUIDE BX ASP/DRAIN  09/13/2019  . KNEE ARTHROSCOPY Bilateral    left x 2, 1 right  .  LAPAROSCOPIC CHOLECYSTECTOMY  ~ 2010  . LUMBAR DISC SURGERY    . NASAL SINUS SURGERY    . TEE WITHOUT CARDIOVERSION N/A 06/16/2019   Procedure: TRANSESOPHAGEAL ECHOCARDIOGRAM (TEE);  Surgeon: Dorothy Spark, MD;  Location: [facility] ENDOSCOPY;  Service: Cardiovascular;  Laterality: N/A;    Prior to Admission medications   Medication Sig Start Date End Date Taking? Authorizing Provider  blood glucose meter kit and supplies KIT Dispense based on patient and insurance preference. Use up to four times daily as  directed. (FOR ICD-9 250.00, 250.01). Patient taking differently: Inject 1 each into the skin as directed. Dispense based on patient and insurance preference. Use up to four times daily as directed. (FOR ICD-9 250.00, 250.01). 06/30/19  Yes Hongalgi, Lenis Dickinson, MD  ciprofloxacin (CIPRO) 500 MG tablet Take 1 tablet (500 mg total) by mouth 2 (two) times daily for 18 days. continue antibiotics through 11/01/19 10/14/19 11/01/19 Yes Domenic Polite, MD  Ensure Max Protein (ENSURE MAX PROTEIN) LIQD Take 330 mLs (11 oz total) by mouth daily. 07/01/19  Yes Hongalgi, Lenis Dickinson, MD  ferrous sulfate 325 (65 FE) MG tablet Take 1 tablet (325 mg total) by mouth daily with breakfast. 09/24/19 10/24/19 Yes Kyle, Tyrone A, DO  furosemide (LASIX) 40 MG tablet Take 1 tablet (40 mg total) by mouth daily. 09/24/19 10/24/19 Yes Kyle, Tyrone A, DO  insulin aspart (NOVOLOG) 100 UNIT/ML injection Inject 3 Units into the skin in the morning and at bedtime.    Yes [provider]  insulin glargine (LANTUS) 100 UNIT/ML injection Inject 0.15 mLs (15 Units total) into the skin daily as needed (only if BGL is 120 or greater). Patient taking differently: Inject 9 Units into the skin daily.  10/14/19  Yes Domenic Polite, MD  Insulin Syringes, Disposable, U-100 0.5 ML MISC Use as directed 3 times daily AC. Patient taking differently: 1 each by Other route See admin instructions. Use as directed 3 times daily AC. 06/30/19  Yes Hongalgi, Lenis Dickinson, MD  Multiple Vitamin (MULTIVITAMIN WITH MINERALS) TABS tablet Take 1 tablet by mouth daily. 07/01/19  Yes Hongalgi, Lenis Dickinson, MD  oxyCODONE (OXY IR/ROXICODONE) 5 MG immediate release tablet Take 1 tablet (5 mg total) by mouth every 4 (four) hours as needed for moderate pain (pain score 4-6). 10/15/19  Yes Persons, Bevely Palmer, PA  potassium chloride SA (KLOR-CON) 20 MEQ tablet Take 1 tablet (20 mEq total) by mouth daily. 10/14/19 11/13/19 Yes Domenic Polite, MD  spironolactone (ALDACTONE) 100 MG tablet  Take 1 tablet (100 mg total) by mouth daily. 09/25/19 10/25/19 Yes Kyle, Tyrone A, DO  aspirin 325 MG tablet Take 1 tablet (325 mg total) by mouth daily. Patient not taking: Reported on 10/17/2019 07/25/19   Persons, Bevely Palmer, [place]  methocarbamol (ROBAXIN) 500 MG tablet Take 1 tablet (500 mg total) by mouth every 6 (six) hours as needed for muscle spasms. 10/14/19   Domenic Polite, MD  polyethylene glycol (MIRALAX / GLYCOLAX) 17 g packet Take 17 g by mouth daily as needed for mild constipation. Patient not taking: Reported on 10/17/2019 10/14/19   Domenic Polite, MD    Scheduled Meds: . ciprofloxacin  500 mg Oral BID  . ferrous sulfate  325 mg Oral Q breakfast  . furosemide  40 mg Oral Daily  . insulin aspart  0-9 Units Subcutaneous TID WC  . insulin glargine  9 Units Subcutaneous Daily  . multivitamin with minerals  1 tablet Oral Daily  . potassium chloride SA  20  mEq Oral Daily  . Ensure Max Protein  11 oz Oral Daily  . spironolactone  100 mg Oral Daily   Continuous Infusions: . albumin human 25 g (10/17/19 1313)   PRN Meds:.lidocaine, methocarbamol, ondansetron **OR** ondansetron (ZOFRAN) IV, oxyCODONE, polyethylene glycol  Allergies as of 10/16/2019 - Review Complete 10/16/2019  Allergen Reaction Noted  . Sulfa antibiotics Rash and Other (See Comments) 05/27/2019    Family History  Problem Relation Age of Onset  . Hypertension Father     Social History   Socioeconomic History  . Marital status: Married    Spouse name: Not on file  . Number of children: 5  . Years of education: Not on file  . Highest education level: Not on file  Occupational History  . Occupation: Logistics  Tobacco Use  . Smoking status: Former Smoker    Types: Cigars    Quit date: 09/10/2017    Years since quitting: 2.1  . Smokeless tobacco: Never Used  Substance and Sexual Activity  . Alcohol use: Not Currently  . Drug use: Not Currently  . Sexual activity: Not on file  Other Topics Concern  . Not on  file  Social History Narrative  . Not on file   Social Determinants of Health   Financial Resource Strain:   . Difficulty of Paying Living Expenses:   Food Insecurity:   . Worried About Charity fundraiser in the Last Year:   . Arboriculturist in the Last Year:   Transportation Needs:   . Film/video editor (Medical):   Marland Kitchen Lack of Transportation (Non-Medical):   Physical Activity:   . Days of Exercise per Week:   . Minutes of Exercise per Session:   Stress:   . Feeling of Stress :   Social Connections:   . Frequency of Communication with Friends and Family:   . Frequency of Social Gatherings with Friends and Family:   . Attends Religious Services:   . Active Member of Clubs or Organizations:   . Attends Archivist Meetings:   Marland Kitchen Marital Status:   Intimate Partner Violence:   . Fear of Current or Ex-Partner:   . Emotionally Abused:   Marland Kitchen Physically Abused:   . Sexually Abused:     Review of Systems:   Review of Systems  Constitutional: Negative for chills, fever and weight loss.  HENT: Negative for hearing loss and tinnitus.   Eyes: Negative for blurred vision and pain.  Respiratory: Positive for shortness of breath. Negative for cough.   Cardiovascular: Positive for orthopnea and leg swelling. Negative for chest pain and palpitations.  Gastrointestinal: Negative for abdominal pain, blood in stool, constipation, diarrhea, heartburn, melena, nausea and vomiting.  Genitourinary: Negative for dysuria and hematuria.  Musculoskeletal: Negative for myalgias and neck pain.  Skin: Negative for itching and rash.  Neurological: Negative for seizures and loss of consciousness.  Endo/Heme/Allergies: Does not bruise/bleed easily.    Physical Exam: Vital signs: Vitals:   10/17/19 1400 10/17/19 1430  BP: (!) 108/58 (!) 102/53  Pulse: 81 81  Resp: 18 17  Temp:    SpO2: 98% 100%     Physical Exam  Constitutional: He is oriented to person, place, and time. He  appears well-developed and well-nourished. No distress.  HENT:  Head: Normocephalic and atraumatic.  Eyes: Conjunctivae and EOM are normal. No scleral icterus.  Cardiovascular: Normal rate, regular rhythm and normal heart sounds.  Pulmonary/Chest: Effort normal and breath sounds normal. No respiratory  distress.  Abdominal: Soft. Bowel sounds are normal. He exhibits no distension and no mass. There is no abdominal tenderness. There is no rebound and no guarding.  Musculoskeletal:        General: Edema (bilateral lower extremity pitting edema, 1+) present. Normal range of motion.     Cervical back: Normal range of motion and neck supple.     Comments: Wound vac on lateral left thigh  Neurological: He is alert and oriented to person, place, and time.  Skin: Skin is warm and dry.  Psychiatric: He has a normal mood and affect. His behavior is normal.    GI:  Lab Results: Recent Labs    10/16/19 2316 10/17/19 0803  WBC 13.7* 7.2  HGB 10.6* 8.7*  HCT 32.9* 27.4*  PLT 157 PLATELET CLUMPS NOTED ON SMEAR, COUNT APPEARS DECREASED   BMET Recent Labs    10/16/19 2316 10/17/19 0803  NA 132* 133*  K 3.6 3.5  CL 101 101  CO2 19* 22  GLUCOSE 181* 147*  BUN 27* 26*  CREATININE 1.22 1.15  CALCIUM 8.5* 8.5*   LFT Recent Labs    10/17/19 0803  PROT 5.6*  ALBUMIN 2.5*  AST 78*  ALT 81*  ALKPHOS 126  BILITOT 1.3*   PT/INR Recent Labs    10/16/19 2316  LABPROT 16.9*  INR 1.4*     Studies/Results: CT ABDOMEN PELVIS W CONTRAST  Result Date: 10/17/2019 CLINICAL DATA:  Abdominal distension. EXAM: CT ABDOMEN AND PELVIS WITH CONTRAST TECHNIQUE: Multidetector CT imaging of the abdomen and pelvis was performed using the standard protocol following bolus administration of intravenous contrast. CONTRAST:  183m OMNIPAQUE IOHEXOL 300 MG/ML  SOLN COMPARISON:  September 12, 2019 FINDINGS: Lower chest: There is a small left-sided pleural effusion.The heart size is normal. Hepatobiliary: The liver  is cirrhotic. There is a tiny hypodense nodule at the dome of the liver (axial series 4, image 8). This is favored to represent a small cyst. Patient is status post cholecystectomy. There is no evidence for biliary ductal dilatation. Pancreas: Normal contours without ductal dilatation. No peripancreatic fluid collection. Spleen: The spleen is significantly enlarged measuring approximately 18 cm craniocaudad. Adrenals/Urinary Tract: --Adrenal glands: Unremarkable. --Right kidney/ureter: No hydronephrosis or radiopaque kidney stones. --Left kidney/ureter: No hydronephrosis or radiopaque kidney stones. --Urinary bladder: Unremarkable. Stomach/Bowel: --Stomach/Duodenum: Esophageal varices are noted. --Small bowel: Unremarkable. --Colon: Unremarkable. --Appendix: Normal. Vascular/Lymphatic: There is no abdominal aortic aneurysm. The portal vein is patent. The splenic vein is patent. The SMV is patent. There is a splenorenal shunt. --No retroperitoneal lymphadenopathy. --No mesenteric lymphadenopathy. --No pelvic or inguinal lymphadenopathy. Reproductive: Unremarkable Other: There is a large volume of abdominal ascites. The abdominal wall is normal. Musculoskeletal. No acute displaced fractures. The previously demonstrated left hip abscess has essentially resolved. There is a small residual pocket of gas in this location which may related to prior intervention. There is no CT evidence for left hip osteomyelitis. IMPRESSION: 1. Cirrhosis with stigmata of portal hypertension. 2. Large volume abdominal ascites. 3. Small left-sided pleural effusion. Electronically Signed   By: CConstance HolsterM.D.   On: 10/17/2019 02:46   DG Chest Port 1 View  Result Date: 10/16/2019 CLINICAL DATA:  Shortness of breath. EXAM: PORTABLE CHEST 1 VIEW COMPARISON:  None. FINDINGS: Decreased lung volumes are seen likely secondary to suboptimal patient inspiration. There is no evidence of acute infiltrate, pleural effusion or pneumothorax. The  heart size and mediastinal contours are within normal limits. Multilevel degenerative changes seen throughout the thoracic  spine. IMPRESSION: No active disease. Electronically Signed   By: Virgina Norfolk M.D.   On: 10/16/2019 23:37   IR Paracentesis  Result Date: 10/17/2019 INDICATION: Patient with history of NASH cirrhosis, recurrent ascites. Request is made for diagnostic and therapeutic paracentesis. EXAM: ULTRASOUND GUIDED DIAGNOSTIC AND THERAPEUTIC PARACENTESIS MEDICATIONS: 10 mL 1% lidocaine COMPLICATIONS: None immediate. PROCEDURE: Informed written consent was obtained from the patient after a discussion of the risks, benefits and alternatives to treatment. A timeout was performed prior to the initiation of the procedure. Initial ultrasound scanning demonstrates a large amount of ascites within the left lateral abdomen. The left lateral abdomen was prepped and draped in the usual sterile fashion. 1% lidocaine was used for local anesthesia. Following this, a 19 gauge, 10-cm, Yueh catheter was introduced. An ultrasound image was saved for documentation purposes. The paracentesis was performed. The catheter was removed and a dressing was applied. The patient tolerated the procedure well without immediate post procedural complication. Patient received post-procedure intravenous albumin; see nursing notes for details. FINDINGS: A total of approximately 8.4 liters of yellow fluid was removed. Samples were sent to the laboratory as requested by the clinical team. IMPRESSION: Successful ultrasound-guided diagnostic and therapeutic paracentesis yielding 8.4 liters of peritoneal fluid. Read by: Brynda Greathouse PA-C Electronically Signed   By: Jerilynn Mages.  Shick M.D.   On: 10/17/2019 11:36    Impression: NASH cirrhosis with worsening ascites.  MELD as of 4/8 is 17.  LFTs remain stable.  T bili 1.3/AST 78/ALT 81/alk phos 126.  Ammonia 50.  Patient is mentating normally.  WBCs have normalized (7.2).  Normocytic  anemia, hemoglobin 8.7.  No signs of GI bleeding.  Plan: Continue spironolactone 152m daily.  Continue 464mLasix daily.  If worsening lower extremity edema, consider increased dosing.  If patient experiences constipation, consider lactulose as needed.  Fluid restriction 40 oz/1200 mLs.  [facility] will follow.   LOS: 0 days   AlSalley SlaughterPA-C 10/17/2019, 2:58 PM  Contact #  [phone]

## 2019-10-17 NOTE — ED Notes (Signed)
Breakfast Ordered 

## 2019-10-18 DIAGNOSIS — R188 Other ascites: Secondary | ICD-10-CM

## 2019-10-18 LAB — GLUCOSE, CAPILLARY
Glucose-Capillary: 98 mg/dL (ref 70–99)
Glucose-Capillary: 105 mg/dL — ABNORMAL HIGH (ref 70–99)

## 2019-10-18 LAB — MAGNESIUM: Magnesium: 1.8 mg/dL (ref 1.7–2.4)

## 2019-10-18 MED ORDER — ONDANSETRON HCL 4 MG PO TABS: 4.0000 mg | ORAL_TABLET | Freq: Four times a day (QID) | ORAL | 0 refills | Status: AC | PRN

## 2019-10-18 NOTE — Discharge Summary (Signed)
Physician Discharge Summary  Robert Sullivan HDQ:[id number] DOB: 17-Nov-1965 DOA: 10/16/2019  PCP: Cathleen Corti, PA-C  Admit date: 10/16/2019 Discharge date: 10/18/2019  Admitted From: Home Disposition: Home  Recommendations for Outpatient Follow-up:  1. Follow up with PCP in 1 week with repeat CBC/CMP 2. Outpatient follow-up with GI/Dr. Watt Climes 3. Outpatient follow-up with Dr. Sharol Given as scheduled next week.  Wound care/wound VAC care as per Dr. Jess Barters recommendations. 4. Follow up in ED if symptoms worsen or new appear   Home Health: No Equipment/Devices: Wound VAC.  Patient presented to the hospital with a wound VAC.  Discharge Condition: Stable CODE STATUS: Full Diet recommendation: Heart healthy/carb modified/fluid restriction of up to 1200-1500 cc a day  Brief/Interim Summary: 54 y.o. male with history of cirrhosis secondary to nonalcoholic steatohepatitis, sleep apnea on CPAP who was recently discharged from the hospital after being admitted for wound dehiscence and had required multiple surgeries for the left gluteal abscess and wound and had undergone shock liver along with paracentesis for large volume ascites presented with increasing abdominal distention and 20 pound weight gain with increased abdominal girth.  CT of the abdomen and pelvis on presentation showed large volume ascites.  GI was consulted.  During the hospitalization, he underwent paracentesis and removal of 8.4 L fluid by IR on 10/17/2019.  Subsequently he feels much better.  GI has cleared the patient for discharge on oral diuretics.  Outpatient follow-up with PCP and GI.  Dr. Sharol Given also recommends outpatient follow-up with him.  Discharge Diagnoses:   Decompensated cirrhosis of liver with large ascites with elevated LFTs -Status post IR guided paracentesis and removal of 8.4 L fluid on 10/17/2019.  Subsequently, he feels much better.  GI also has evaluated him and have signed off and recommended current doses of diuretics  Lasix 40 mg daily and spironolactone 100 mg daily.  If he notices leg swelling/Abdominal distention, he can take increased doses of both new medications and notify PCP/GI.  Outpatient follow-up with GI. -Monitor daily weights.  Fluid restriction of up to 1200-1500 cc a day.  Low-salt diet.  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  Carb modified diet  Anemia of chronic disease Thrombocytopenia -From cirrhosis of liver.  Hemoglobin stable.  Outpatient follow-up.  Discharge Instructions  Discharge Instructions    Diet - low sodium heart healthy   Complete by: As directed    Diet Carb Modified   Complete by: As directed    Increase activity slowly   Complete by: As directed      Allergies as of 10/18/2019      Reactions   Sulfa Antibiotics Rash, Other (See Comments)   Total body rash      Medication List    STOP taking these medications   aspirin 325 MG tablet     TAKE these medications   blood glucose meter kit and supplies Kit Dispense based on patient and insurance preference. Use up to four times daily as directed. (FOR ICD-9 250.00, 250.01). What changed:   how much to take  how to take this  when to take this   ciprofloxacin 500 MG tablet Commonly known as: Cipro Take 1 tablet (500 mg total) by mouth 2 (two) times daily for 18 days. continue antibiotics through 11/01/19   Ensure Max Protein Liqd Take 330  mLs (11 oz total) by mouth daily.   ferrous sulfate 325 (65 FE) MG tablet Take 1 tablet (325 mg total) by mouth daily with breakfast.   furosemide 40 MG tablet Commonly known as: Lasix Take 1 tablet (40 mg total) by mouth daily.   insulin glargine 100 UNIT/ML injection Commonly known as: LANTUS Inject 0.15 mLs (15 Units total) into the skin  daily as needed (only if BGL is 120 or greater). What changed:   how much to take  when to take this   Insulin Syringes (Disposable) U-100 0.5 ML Misc Use as directed 3 times daily AC. What changed:   how much to take  how to take this  when to take this   methocarbamol 500 MG tablet Commonly known as: ROBAXIN Take 1 tablet (500 mg total) by mouth every 6 (six) hours as needed for muscle spasms.   multivitamin with minerals Tabs tablet Take 1 tablet by mouth daily.   NovoLOG 100 UNIT/ML injection Generic drug: insulin aspart Inject 3 Units into the skin in the morning and at bedtime.   ondansetron 4 MG tablet Commonly known as: ZOFRAN Take 1 tablet (4 mg total) by mouth every 6 (six) hours as needed for nausea.   oxyCODONE 5 MG immediate release tablet Commonly known as: Oxy IR/ROXICODONE Take 1 tablet (5 mg total) by mouth every 4 (four) hours as needed for moderate pain (pain score 4-6).   polyethylene glycol 17 g packet Commonly known as: MIRALAX / GLYCOLAX Take 17 g by mouth daily as needed for mild constipation.   potassium chloride SA 20 MEQ tablet Commonly known as: KLOR-CON Take 1 tablet (20 mEq total) by mouth daily.   spironolactone 100 MG tablet Commonly known as: ALDACTONE Take 1 tablet (100 mg total) by mouth daily.      Follow-up Information    Cathleen Corti, PA-C. Schedule an appointment as soon as possible for a visit in 1 week(s).   Specialty: Physician Assistant Why: with repeat cbc/cmp Contact information: [place] [address] [phone]        Clarene Essex, MD. Schedule an appointment as soon as possible for a visit in 1 week(s).   Specialty: Gastroenterology Contact information: [address] [place] [address] [phone]          Allergies  Allergen Reactions  . Sulfa Antibiotics Rash and Other (See Comments)    Total body rash     Consultations:  GI/IR/orthopedics   Procedures/Studies: CT ABDOMEN PELVIS W CONTRAST  Result Date: 10/17/2019 CLINICAL DATA:  Abdominal distension. EXAM: CT ABDOMEN AND PELVIS WITH CONTRAST TECHNIQUE: Multidetector CT imaging of the abdomen and pelvis was performed using the standard protocol following bolus administration of intravenous contrast. CONTRAST:  159m OMNIPAQUE IOHEXOL 300 MG/ML  SOLN COMPARISON:  September 12, 2019 FINDINGS: Lower chest: There is a small left-sided pleural effusion.The heart size is normal. Hepatobiliary: The liver is cirrhotic. There is a tiny hypodense nodule at the dome of the liver (axial series 4, image 8). This is favored to represent a small cyst. Patient is status post cholecystectomy. There is no evidence for biliary ductal dilatation. Pancreas: Normal contours without ductal dilatation. No peripancreatic fluid collection. Spleen: The spleen is significantly enlarged measuring approximately 18 cm craniocaudad. Adrenals/Urinary Tract: --Adrenal glands: Unremarkable. --Right kidney/ureter: No hydronephrosis or radiopaque kidney stones. --Left kidney/ureter: No hydronephrosis or radiopaque kidney stones. --Urinary bladder: Unremarkable. Stomach/Bowel: --Stomach/Duodenum: Esophageal varices are noted. --Small bowel: Unremarkable. --Colon:  Unremarkable. --Appendix: Normal. Vascular/Lymphatic: There is no abdominal aortic aneurysm. The portal vein is patent. The splenic vein is patent. The SMV is patent. There is a splenorenal shunt. --No retroperitoneal lymphadenopathy. --No mesenteric lymphadenopathy. --No pelvic or inguinal lymphadenopathy. Reproductive: Unremarkable Other: There is a large volume of abdominal ascites. The abdominal wall is normal. Musculoskeletal. No acute displaced fractures. The previously demonstrated left hip abscess has essentially resolved. There is a small residual pocket of gas in this location which may related to prior intervention. There is no  CT evidence for left hip osteomyelitis. IMPRESSION: 1. Cirrhosis with stigmata of portal hypertension. 2. Large volume abdominal ascites. 3. Small left-sided pleural effusion. Electronically Signed   By: Constance Holster M.D.   On: 10/17/2019 02:46   US RENAL  Result Date: 10/10/2019 CLINICAL DATA:  Acute renal failure. EXAM: RENAL / URINARY TRACT ULTRASOUND COMPLETE COMPARISON:  Abdominopelvic CT 09/12/2019 FINDINGS: Right Kidney: Renal measurements: 12.8 x 5.6 x 6.5 cm = volume: 245 mL. Echogenicity within normal limits. No mass or hydronephrosis visualized. Left Kidney: Renal measurements: 13.3 x 5.9 x 4.8 cm = volume: 193 mL. Echogenicity within normal limits. No mass or hydronephrosis visualized. Bladder: Only minimally distended.  Neither ureteral jet is visualized. Other: Small-moderate volume abdominopelvic ascites. Decreased ascites from 09/12/2019 abdominal CT. IMPRESSION: 1. Unremarkable sonographic appearance of the kidneys. No obstructive uropathy. 2. Urinary bladder poorly distended, not well evaluated. 3. Incidental abdominopelvic ascites, diminished ascites from CT last month. Electronically Signed   By: Keith Rake M.D.   On: 10/10/2019 20:30   DG Chest Port 1 View  Result Date: 10/16/2019 CLINICAL DATA:  Shortness of breath. EXAM: PORTABLE CHEST 1 VIEW COMPARISON:  None. FINDINGS: Decreased lung volumes are seen likely secondary to suboptimal patient inspiration. There is no evidence of acute infiltrate, pleural effusion or pneumothorax. The heart size and mediastinal contours are within normal limits. Multilevel degenerative changes seen throughout the thoracic spine. IMPRESSION: No active disease. Electronically Signed   By: Virgina Norfolk M.D.   On: 10/16/2019 23:37   [place] EKG SITE RITE  Result Date: 09/19/2019 If Site Rite image not attached, placement could not be confirmed due to current cardiac rhythm.  IR Paracentesis  Result Date: 10/17/2019 INDICATION: Patient with  history of NASH cirrhosis, recurrent ascites. Request is made for diagnostic and therapeutic paracentesis. EXAM: ULTRASOUND GUIDED DIAGNOSTIC AND THERAPEUTIC PARACENTESIS MEDICATIONS: 10 mL 1% lidocaine COMPLICATIONS: None immediate. PROCEDURE: Informed written consent was obtained from the patient after a discussion of the risks, benefits and alternatives to treatment. A timeout was performed prior to the initiation of the procedure. Initial ultrasound scanning demonstrates a large amount of ascites within the left lateral abdomen. The left lateral abdomen was prepped and draped in the usual sterile fashion. 1% lidocaine was used for local anesthesia. Following this, a 19 gauge, 10-cm, Yueh catheter was introduced. An ultrasound image was saved for documentation purposes. The paracentesis was performed. The catheter was removed and a dressing was applied. The patient tolerated the procedure well without immediate post procedural complication. Patient received post-procedure intravenous albumin; see nursing notes for details. FINDINGS: A total of approximately 8.4 liters of yellow fluid was removed. Samples were sent to the laboratory as requested by the clinical team. IMPRESSION: Successful ultrasound-guided diagnostic and therapeutic paracentesis yielding 8.4 liters of peritoneal fluid. Read by: Brynda Greathouse PA-C Electronically Signed   By: Jerilynn Mages.  Shick M.D.   On: 10/17/2019 11:36       Subjective: Patient seen and examined  at bedside.  He feels much better after paracentesis yesterday.  He feels okay to go home today.  No overnight fever, vomiting, worsening shortness of breath.  Discharge Exam: Vitals:   10/18/19 0252 10/18/19 0528  BP: 119/61 130/61  Pulse: 60 70  Resp: 16 17  Temp: 97.7 F (36.5 C) 97.7 F (36.5 C)  SpO2: 100% 100%    General: Pt is alert, awake, not in acute distress Cardiovascular: rate controlled, S1/S2 + Respiratory: bilateral decreased breath sounds at bases with  some basilar crackles Abdominal: Soft, NT, abdominal distention has much improved, bowel sounds + Extremities: Trace lower extremity bilateral edema, no cyanosis    The results of significant diagnostics from this hospitalization (including imaging, microbiology, ancillary and laboratory) are listed below for reference.     Microbiology: Recent Results (from the past 240 hour(s))  Respiratory Panel by RT PCR (Flu A&B, Covid) - Nasopharyngeal Swab     Status: None   Collection Time: 10/08/19 11:41 AM   Specimen: Nasopharyngeal Swab  Result Value Ref Range Status   SARS Coronavirus 2 by RT PCR NEGATIVE NEGATIVE Final    Comment: (NOTE) SARS-CoV-2 target nucleic acids are NOT DETECTED. The SARS-CoV-2 RNA is generally detectable in upper respiratoy specimens during the acute phase of infection. The lowest concentration of SARS-CoV-2 viral copies this assay can detect is 131 copies/mL. A negative result does not preclude SARS-Cov-2 infection and should not be used as the sole basis for treatment or other patient management decisions. A negative result may occur with  improper specimen collection/handling, submission of specimen other than nasopharyngeal swab, presence of viral mutation(s) within the areas targeted by this assay, and inadequate number of viral copies (<131 copies/mL). A negative result must be combined with clinical observations, patient history, and epidemiological information. The expected result is Negative. Fact Sheet for Patients:  PinkCheek.be Fact Sheet for Healthcare Providers:  GravelBags.it This test is not yet ap proved or cleared by the [place] FDA and  has been authorized for detection and/or diagnosis of SARS-CoV-2 by FDA under an Emergency Use Authorization (EUA). This EUA will remain  in effect (meaning this test can be used) for the duration of the COVID-19 declaration under Section  564(b)(1) of the Act, 21 U.S.C. section 360bbb-3(b)(1), unless the authorization is terminated or revoked sooner.    Influenza A by PCR NEGATIVE NEGATIVE Final   Influenza B by PCR NEGATIVE NEGATIVE Final    Comment: (NOTE) The Xpert Xpress SARS-CoV-2/FLU/RSV assay is intended as an aid in  the diagnosis of influenza from Nasopharyngeal swab specimens and  should not be used as a sole basis for treatment. Nasal washings and  aspirates are unacceptable for Xpert Xpress SARS-CoV-2/FLU/RSV  testing. Fact Sheet for Patients: PinkCheek.be Fact Sheet for Healthcare Providers: GravelBags.it This test is not yet approved or cleared by the [place] FDA and  has been authorized for detection and/or diagnosis of SARS-CoV-2 by  FDA under an Emergency Use Authorization (EUA). This EUA will remain  in effect (meaning this test can be used) for the duration of the  Covid-19 declaration under Section 564(b)(1) of the Act, 21  U.S.C. section 360bbb-3(b)(1), unless the authorization is  terminated or revoked. Performed at [facility], [place] [address] [place], [place] [address]   Aerobic/Anaerobic Culture (surgical/deep wound)     Status: None   Collection Time: 10/08/19  1:20 PM   Specimen: Wound  Result Value Ref Range Status   Specimen Description WOUND  Final   Special Requests LEFT HIP  Final   Gram Stain   Final    ABUNDANT WBC PRESENT,BOTH PMN AND MONONUCLEAR NO ORGANISMS SEEN    Culture   Final    MODERATE ENTEROBACTER SPECIES NO ANAEROBES ISOLATED Performed at [facility], [address] [place], [place] [address]    Report Status 10/13/2019 FINAL  Final   Organism ID, Bacteria ENTEROBACTER SPECIES  Final      Susceptibility   Enterobacter species - MIC*    CEFAZOLIN >=64 RESISTANT Resistant     CEFEPIME <=0.12 SENSITIVE Sensitive     CEFTAZIDIME <=1 SENSITIVE Sensitive     CIPROFLOXACIN <=0.25  SENSITIVE Sensitive     GENTAMICIN <=1 SENSITIVE Sensitive     IMIPENEM <=0.25 SENSITIVE Sensitive     TRIMETH/SULFA <=20 SENSITIVE Sensitive     PIP/TAZO <=4 SENSITIVE Sensitive     * MODERATE ENTEROBACTER SPECIES  Culture, blood (routine x 2)     Status: None   Collection Time: 10/10/19  1:50 AM   Specimen: BLOOD LEFT HAND  Result Value Ref Range Status   Specimen Description BLOOD LEFT HAND  Final   Special Requests   Final    BOTTLES DRAWN AEROBIC AND ANAEROBIC Blood Culture results may not be optimal due to an excessive volume of blood received in culture bottles   Culture   Final    NO GROWTH 5 DAYS Performed at [facility], [place] [address], [place], [place] [address]    Report Status 10/15/2019 FINAL  Final  Culture, blood (routine x 2)     Status: None   Collection Time: 10/10/19  2:46 AM   Specimen: BLOOD  Result Value Ref Range Status   Specimen Description BLOOD LEFT WRIST  Final   Special Requests   Final    BOTTLES DRAWN AEROBIC AND ANAEROBIC Blood Culture adequate volume   Culture   Final    NO GROWTH 5 DAYS Performed at [facility], [place] [address] [place], [place] [address]    Report Status 10/15/2019 FINAL  Final  Surgical pcr screen     Status: None   Collection Time: 10/10/19  8:16 AM   Specimen: Nasal Mucosa; Nasal Swab  Result Value Ref Range Status   MRSA, PCR NEGATIVE NEGATIVE Final   Staphylococcus aureus NEGATIVE NEGATIVE Final    Comment: (NOTE) The Xpert SA Assay (FDA approved for NASAL specimens in patients 29 years of age and older), is one component of a comprehensive surveillance program. It is not intended to diagnose infection nor to guide or monitor treatment. Performed at [facility], [place] [address], [place], [place] [address]   SARS CORONAVIRUS 2 (TAT 6-24 HRS) Nasopharyngeal Nasopharyngeal Swab     Status: None   Collection Time: 10/17/19 12:03 AM   Specimen: Nasopharyngeal Swab  Result Value Ref Range Status    SARS Coronavirus 2 NEGATIVE NEGATIVE Final    Comment: (NOTE) SARS-CoV-2 target nucleic acids are NOT DETECTED. The SARS-CoV-2 RNA is generally detectable in upper and lower respiratory specimens during the acute phase of infection. Negative results do not preclude SARS-CoV-2 infection, do not rule out co-infections with other pathogens, and should not be used as the sole basis for treatment or other patient management decisions. Negative results must be combined with clinical observations, patient history, and epidemiological information. The expected result is Negative. Fact Sheet for Patients: SugarRoll.be Fact Sheet for Healthcare Providers: https://www.woods-mathews.com/ This test is not  yet approved or cleared by the [place] and  has been authorized for detection and/or diagnosis of SARS-CoV-2 by FDA under an Emergency Use Authorization (EUA). This EUA will remain  in effect (meaning this test can be used) for the duration of the COVID-19 declaration under Section 56 4(b)(1) of the Act, 21 U.S.C. section 360bbb-3(b)(1), unless the authorization is terminated or revoked sooner. Performed at [facility], [place] [address] [place], [place] [address]   Gram stain     Status: None   Collection Time: 10/17/19 11:06 AM   Specimen: Peritoneal Washings  Result Value Ref Range Status   Specimen Description PERITONEAL  Final   Special Requests NONE  Final   Gram Stain   Final    WBC PRESENT, PREDOMINANTLY MONONUCLEAR NO ORGANISMS SEEN CYTOSPIN SMEAR Performed at [facility], [place] [address], [place], [place] [address]    Report Status 10/17/2019 FINAL  Final     Labs: BNP (last 3 results) Recent Labs    09/12/19 0744 10/16/19 2316  BNP 23.2 70.2   Basic Metabolic Panel: Recent Labs  Lab 10/13/19 0620 10/14/19 0623 10/16/19 2316 10/17/19 0803 10/18/19 0822  NA 136 134* 132* 133*  --   K 3.6 3.8 3.6 3.5  --    CL 104 102 101 101  --   CO2 24 24 19* 22  --   GLUCOSE 139* 160* 181* 147*  --   BUN 27* 29* 27* 26*  --   CREATININE 0.99 0.95 1.22 1.15  --   CALCIUM 8.2* 8.4* 8.5* 8.5*  --   MG  --   --   --  1.8 1.8   Liver Function Tests: Recent Labs  Lab 10/13/19 0620 10/14/19 0623 10/16/19 2316 10/17/19 0803  AST 369* 195* 101* 78*  ALT 280* 206* 107* 81*  ALKPHOS 138* 136* 166* 126  BILITOT 1.5* 1.2 1.4* 1.3*  PROT 5.5* 5.6* 6.2* 5.6*  ALBUMIN 2.3* 2.3* 2.4* 2.5*   Recent Labs  Lab 10/16/19 2316  LIPASE 34   Recent Labs  Lab 10/16/19 2313  AMMONIA 50*   CBC: Recent Labs  Lab 10/12/19 0520 10/12/19 0520 10/12/19 1758 10/13/19 0620 10/14/19 0623 10/16/19 2316 10/17/19 0803  WBC 4.7  --   --  6.5 8.5 13.7* 7.2  NEUTROABS 2.9  --   --  3.7  --  9.4* 3.9  HGB 7.1*   < > 9.6* 9.2* 9.3* 10.6* 8.7*  HCT 22.5*   < > 28.8* 28.6* 28.9* 32.9* 27.4*  MCV 89.3  --   --  90.8 90.6 91.9 93.2  PLT 73*  --   --  94* 98* 157 PLATELET CLUMPS NOTED ON SMEAR, COUNT APPEARS DECREASED   < > = values in this interval not displayed.   Cardiac Enzymes: No results for input(s): CKTOTAL, CKMB, CKMBINDEX, TROPONINI in the last 168 hours. BNP: Invalid input(s): POCBNP CBG: Recent Labs  Lab 10/17/19 0801 10/17/19 1213 10/17/19 1724 10/17/19 2120 10/18/19 0749  GLUCAP 131* 108* 131* 113* 98   D-Dimer No results for input(s): DDIMER in the last 72 hours. Hgb A1c No results for input(s): HGBA1C in the last 72 hours. Lipid Profile No results for input(s): CHOL, HDL, LDLCALC, TRIG, CHOLHDL, LDLDIRECT in the last 72 hours. Thyroid function studies No results for input(s): TSH, T4TOTAL, T3FREE, THYROIDAB in the last 72 hours.  Invalid input(s): FREET3 Anemia work up No results for input(s): VITAMINB12, FOLATE, FERRITIN, TIBC, IRON, RETICCTPCT  in the last 72 hours. Urinalysis    Component Value Date/Time   COLORURINE YELLOW 10/17/2019 0110   APPEARANCEUR HAZY (A) 10/17/2019 0110    LABSPEC 1.010 10/17/2019 0110   PHURINE 6.0 10/17/2019 0110   GLUCOSEU NEGATIVE 10/17/2019 0110   HGBUR NEGATIVE 10/17/2019 0110   BILIRUBINUR NEGATIVE 10/17/2019 0110   KETONESUR NEGATIVE 10/17/2019 0110   PROTEINUR NEGATIVE 10/17/2019 0110   NITRITE NEGATIVE 10/17/2019 0110   LEUKOCYTESUR MODERATE (A) 10/17/2019 0110   Sepsis Labs Invalid input(s): PROCALCITONIN,  WBC,  LACTICIDVEN Microbiology Recent Results (from the past 240 hour(s))  Respiratory Panel by RT PCR (Flu A&B, Covid) - Nasopharyngeal Swab     Status: None   Collection Time: 10/08/19 11:41 AM   Specimen: Nasopharyngeal Swab  Result Value Ref Range Status   SARS Coronavirus 2 by RT PCR NEGATIVE NEGATIVE Final    Comment: (NOTE) SARS-CoV-2 target nucleic acids are NOT DETECTED. The SARS-CoV-2 RNA is generally detectable in upper respiratoy specimens during the acute phase of infection. The lowest concentration of SARS-CoV-2 viral copies this assay can detect is 131 copies/mL. A negative result does not preclude SARS-Cov-2 infection and should not be used as the sole basis for treatment or other patient management decisions. A negative result may occur with  improper specimen collection/handling, submission of specimen other than nasopharyngeal swab, presence of viral mutation(s) within the areas targeted by this assay, and inadequate number of viral copies (<131 copies/mL). A negative result must be combined with clinical observations, patient history, and epidemiological information. The expected result is Negative. Fact Sheet for Patients:  PinkCheek.be Fact Sheet for Healthcare Providers:  GravelBags.it This test is not yet ap proved or cleared by the [place] FDA and  has been authorized for detection and/or diagnosis of SARS-CoV-2 by FDA under an Emergency Use Authorization (EUA). This EUA will remain  in effect (meaning this test can be used)  for the duration of the COVID-19 declaration under Section 564(b)(1) of the Act, 21 U.S.C. section 360bbb-3(b)(1), unless the authorization is terminated or revoked sooner.    Influenza A by PCR NEGATIVE NEGATIVE Final   Influenza B by PCR NEGATIVE NEGATIVE Final    Comment: (NOTE) The Xpert Xpress SARS-CoV-2/FLU/RSV assay is intended as an aid in  the diagnosis of influenza from Nasopharyngeal swab specimens and  should not be used as a sole basis for treatment. Nasal washings and  aspirates are unacceptable for Xpert Xpress SARS-CoV-2/FLU/RSV  testing. Fact Sheet for Patients: PinkCheek.be Fact Sheet for Healthcare Providers: GravelBags.it This test is not yet approved or cleared by the [place] FDA and  has been authorized for detection and/or diagnosis of SARS-CoV-2 by  FDA under an Emergency Use Authorization (EUA). This EUA will remain  in effect (meaning this test can be used) for the duration of the  Covid-19 declaration under Section 564(b)(1) of the Act, 21  U.S.C. section 360bbb-3(b)(1), unless the authorization is  terminated or revoked. Performed at [facility], [place] [address] [place], [place] [address]   Aerobic/Anaerobic Culture (surgical/deep wound)     Status: None   Collection Time: 10/08/19  1:20 PM   Specimen: Wound  Result Value Ref Range Status   Specimen Description WOUND  Final   Special Requests LEFT HIP  Final   Gram Stain   Final    ABUNDANT WBC PRESENT,BOTH PMN AND MONONUCLEAR NO ORGANISMS SEEN    Culture   Final    MODERATE ENTEROBACTER SPECIES NO ANAEROBES ISOLATED Performed  at [facility], [place] [address], [place], [place] [address]    Report Status 10/13/2019 FINAL  Final   Organism ID, Bacteria ENTEROBACTER SPECIES  Final      Susceptibility   Enterobacter species - MIC*    CEFAZOLIN >=64 RESISTANT Resistant     CEFEPIME <=0.12 SENSITIVE Sensitive      CEFTAZIDIME <=1 SENSITIVE Sensitive     CIPROFLOXACIN <=0.25 SENSITIVE Sensitive     GENTAMICIN <=1 SENSITIVE Sensitive     IMIPENEM <=0.25 SENSITIVE Sensitive     TRIMETH/SULFA <=20 SENSITIVE Sensitive     PIP/TAZO <=4 SENSITIVE Sensitive     * MODERATE ENTEROBACTER SPECIES  Culture, blood (routine x 2)     Status: None   Collection Time: 10/10/19  1:50 AM   Specimen: BLOOD LEFT HAND  Result Value Ref Range Status   Specimen Description BLOOD LEFT HAND  Final   Special Requests   Final    BOTTLES DRAWN AEROBIC AND ANAEROBIC Blood Culture results may not be optimal due to an excessive volume of blood received in culture bottles   Culture   Final    NO GROWTH 5 DAYS Performed at [facility], [place] [address] [place], [place] [address]    Report Status 10/15/2019 FINAL  Final  Culture, blood (routine x 2)     Status: None   Collection Time: 10/10/19  2:46 AM   Specimen: BLOOD  Result Value Ref Range Status   Specimen Description BLOOD LEFT WRIST  Final   Special Requests   Final    BOTTLES DRAWN AEROBIC AND ANAEROBIC Blood Culture adequate volume   Culture   Final    NO GROWTH 5 DAYS Performed at [facility], [place] [address] [place], [place] [address]    Report Status 10/15/2019 FINAL  Final  Surgical pcr screen     Status: None   Collection Time: 10/10/19  8:16 AM   Specimen: Nasal Mucosa; Nasal Swab  Result Value Ref Range Status   MRSA, PCR NEGATIVE NEGATIVE Final   Staphylococcus aureus NEGATIVE NEGATIVE Final    Comment: (NOTE) The Xpert SA Assay (FDA approved for NASAL specimens in patients 46 years of age and older), is one component of a comprehensive surveillance program. It is not intended to diagnose infection nor to guide or monitor treatment. Performed at [facility], [place] [address] [place], [place] [address]   SARS CORONAVIRUS 2 (TAT 6-24 HRS) Nasopharyngeal Nasopharyngeal Swab     Status: None   Collection Time: 10/17/19 12:03 AM    Specimen: Nasopharyngeal Swab  Result Value Ref Range Status   SARS Coronavirus 2 NEGATIVE NEGATIVE Final    Comment: (NOTE) SARS-CoV-2 target nucleic acids are NOT DETECTED. The SARS-CoV-2 RNA is generally detectable in upper and lower respiratory specimens during the acute phase of infection. Negative results do not preclude SARS-CoV-2 infection, do not rule out co-infections with other pathogens, and should not be used as the sole basis for treatment or other patient management decisions. Negative results must be combined with clinical observations, patient history, and epidemiological information. The expected result is Negative. Fact Sheet for Patients: SugarRoll.be Fact Sheet for Healthcare Providers: https://www.woods-mathews.com/ This test is not yet approved or cleared by the [place] FDA and  has been authorized for detection and/or diagnosis of SARS-CoV-2 by FDA under an Emergency Use Authorization (EUA). This EUA will remain  in effect (meaning this test can be used) for the duration of  the COVID-19 declaration under Section 56 4(b)(1) of the Act, 21 U.S.C. section 360bbb-3(b)(1), unless the authorization is terminated or revoked sooner. Performed at [facility], [place] [address] [place], [place] [address]   Gram stain     Status: None   Collection Time: 10/17/19 11:06 AM   Specimen: Peritoneal Washings  Result Value Ref Range Status   Specimen Description PERITONEAL  Final   Special Requests NONE  Final   Gram Stain   Final    WBC PRESENT, PREDOMINANTLY MONONUCLEAR NO ORGANISMS SEEN CYTOSPIN SMEAR Performed at [facility], [place] [address] [place], [place] [address]    Report Status 10/17/2019 FINAL  Final     Time coordinating discharge: 35 minutes  SIGNED:   Aline August, MD  [facility] 10/18/2019, 10:05 AM

## 2019-10-18 NOTE — Progress Notes (Signed)
Patient ID: Robert Sullivan, male   DOB: [birth year], 54 y.o.   MRN: [id number] Wound vac drainage minimal, ok for discharge from ortho standpoint with follow up in my office monday

## 2019-10-18 NOTE — Plan of Care (Signed)

## 2019-10-20 ENCOUNTER — Other Ambulatory Visit: Payer: Self-pay

## 2019-10-20 ENCOUNTER — Encounter: Payer: Self-pay | Admitting: Physician Assistant

## 2019-10-20 ENCOUNTER — Ambulatory Visit (INDEPENDENT_AMBULATORY_CARE_PROVIDER_SITE_OTHER): Payer: Self-pay | Admitting: Physician Assistant

## 2019-10-20 DIAGNOSIS — T8130XA Disruption of wound, unspecified, initial encounter: Secondary | ICD-10-CM

## 2019-10-20 NOTE — Progress Notes (Signed)
Office Visit Note   Patient: Robert Sullivan           Date of Birth: Mar 01, 1966           MRN: [id number] Visit Date: 10/20/2019              Requested by: Cathleen Corti, PA-C [place],  [place] [address] PCP: Cathleen Corti, PA-C  Chief Complaint  Patient presents with  . Left Hip - Follow-up, Routine Post Op      HPI: Patient presents today 9 days status post repeat irrigation and debridement of his left hip.  He was recently discharged from the hospital after undergoing paracentesis for his liver disease.  He is feeling much better than his last visit with [place].  He has had some slow decrease in drainage from the wound VAC canisters denies any fever chills.  He is taking oxycodone for pain  Assessment & Plan: Visit Diagnoses: No diagnosis found.  Plan: Patient will follow up in a week or sooner if he has any concerns.  Wound VAC was removed and a dressing was applied he may do showering.  I did asked that he not overextend himself with long walks or twisting and turning in the car  Follow-Up Instructions: No follow-ups on file.   Ortho Exam  Patient is alert, oriented, no adenopathy, well-dressed, normal affect, normal respiratory effort. Focused examination of the left hip there are opposing wound edges without any necrosis swelling is well controlled no drainage noted.  Imaging: No results found. No images are attached to the encounter.  Labs: Lab Results  Component Value Date   HGBA1C 4.6 (L) 09/12/2019   HGBA1C 5.5 07/10/2019   HGBA1C 11.0 (H) 05/13/2019   ESRSEDRATE 12 10/06/2019   ESRSEDRATE 15 09/12/2019   ESRSEDRATE 12 07/11/2019   CRP 2.8 (H) 10/06/2019   CRP 1.9 (H) 09/25/2019   CRP 1.8 (H) 09/24/2019   REPTSTATUS PENDING 10/17/2019   REPTSTATUS 10/17/2019 FINAL 10/17/2019   GRAMSTAIN  10/17/2019    WBC PRESENT, PREDOMINANTLY MONONUCLEAR NO ORGANISMS SEEN CYTOSPIN SMEAR Performed at [facility], [place] [address],  [place], [place] [address]    CULT  10/17/2019    NO GROWTH 3 DAYS Performed at [place] [address] [place], [place] [address]    LABORGA ENTEROBACTER SPECIES 10/08/2019     Lab Results  Component Value Date   ALBUMIN 2.5 (L) 10/17/2019   ALBUMIN 2.4 (L) 10/16/2019   ALBUMIN 2.3 (L) 10/14/2019    Lab Results  Component Value Date   MG 1.8 10/18/2019   MG 1.8 10/17/2019   MG 1.8 09/25/2019   No results found for: VD25OH  No results found for: PREALBUMIN CBC EXTENDED Latest Ref Rng & Units 10/17/2019 10/16/2019 10/14/2019  WBC 4.0 - 10.5 K/uL 7.2 13.7(H) 8.5  RBC 4.22 - 5.81 MIL/uL 2.94(L) 3.58(L) 3.19(L)  HGB 13.0 - 17.0 g/dL 8.7(L) 10.6(L) 9.3(L)  HCT 39.0 - 52.0 % 27.4(L) 32.9(L) 28.9(L)  PLT 150 - 400 K/uL PLATELET CLUMPS NOTED ON SMEAR, COUNT APPEARS DECREASED 157 98(L)  NEUTROABS 1.7 - 7.7 K/uL 3.9 9.4(H) -  LYMPHSABS 0.7 - 4.0 K/uL 1.6 1.9 -     There is no height or weight on file to calculate BMI.  Orders:  No orders of the defined types were placed in this encounter.  No orders of the defined types were placed in this encounter.    Procedures: No procedures performed  Clinical Data: No additional findings.  ROS:  All other systems negative, except as noted in the HPI. Review of Systems  Objective: Vital Signs: There were no vitals taken for this visit.  Specialty Comments:  No specialty comments available.  PMFS History: Patient Active Problem List   Diagnosis Date Noted  . Decompensated hepatic cirrhosis ([place]) 10/17/2019  . Anemia 10/09/2019  . AKI (acute kidney injury) ([place]) 10/09/2019  . Wound dehiscence 10/08/2019  . Ascites   . Anasarca 09/12/2019  . Cirrhosis of liver with ascites ([place]) 09/12/2019  . Abscess, gluteal, left 09/12/2019  . Pleural effusion 09/12/2019  . Thrombocytopenia ([place]) 09/12/2019  . Blister of hip with infection 07/09/2019  . Abscess of left hip   . MSSA bacteremia 06/19/2019  . Uncontrolled diabetes  mellitus ([place]) 06/10/2019  . Obesity, Class III, BMI 40-49.9 (morbid obesity) ([place]) 06/10/2019  . Cellulitis, gluteal, left 06/10/2019  . Sepsis secondary to UTI ([place]) 05/13/2019  . Acute lower UTI 05/13/2019  . Type 2 diabetes mellitus without complication ([place]) 41/66/0630  . Hip pain, acute, left 05/13/2019  . Sepsis ([place]) 05/13/2019  . Acute cystitis without hematuria   . Hyperglycemia    Past Medical History:  Diagnosis Date  . Anemia   . Anxiety   . Arthritis 05/13/2019   knees  . Cirrhosis ([place])   . Depression   . Diabetes mellitus without complication ([place])   . DVT (deep venous thrombosis) ([place])   . Gallstones   . Gluteal abscess   . Headache   . HTN (hypertension)   . Hyperlipidemia   . Sleep apnea    doesn't use cpap  . Splenomegaly     Family History  Problem Relation Age of Onset  . Hypertension Father     Past Surgical History:  Procedure Laterality Date  . I & D EXTREMITY Left 07/16/2019   Procedure: LEFT HIP DEBRIDEMENT;  Surgeon: Newt Minion, MD;  Location: [facility];  Service: Orthopedics;  Laterality: Left;  . I & D EXTREMITY Left 07/18/2019   Procedure: REPEAT IRRIGATION AND DEBRIDEMENT LEFT HIP;  Surgeon: Newt Minion, MD;  Location: [facility];  Service: Orthopedics;  Laterality: Left;  . I & D EXTREMITY Left 09/19/2019   Procedure: DEBRIDEMENT LEFT HIP Appilcattion OF WOUND VAC;  Surgeon: Newt Minion, MD;  Location: [facility];  Service: Orthopedics;  Laterality: Left;  . I & D EXTREMITY Left 10/08/2019   Procedure: IRRIGATION AND DEBRIDEMENT, REVISION LEFT HIP WOUND;  Surgeon: Newt Minion, MD;  Location: [facility];  Service: Orthopedics;  Laterality: Left;  . INCISION AND DRAINAGE ABSCESS Left 06/23/2019   Procedure: INCISION AND DRAINAGE GLUTEAL ABSCESS;  Surgeon: Renette Butters, MD;  Location: [facility];  Service: Orthopedics;  Laterality: Left;  . INCISION AND DRAINAGE ABSCESS Left 07/09/2019   Procedure: INCISION AND DRAINAGE LEFT HIP/PELVIC ABSCESS,  APPLICATION OF NEGATIVE PRESSURE WOUND VAC;  Surgeon: Newt Minion, MD;  Location: [facility];  Service: Orthopedics;  Laterality: Left;  . INCISION AND DRAINAGE HIP Left 06/25/2019   Procedure: IRRIGATION AND DEBRIDEMENT HIP;  Surgeon: Shona Needles, MD;  Location: [facility];  Service: Orthopedics;  Laterality: Left;  . INCISION AND DRAINAGE HIP Left 10/11/2019   Procedure: IRRIGATION AND DEBRIDEMENT HIP;  Surgeon: Newt Minion, MD;  Location: [facility];  Service: Orthopedics;  Laterality: Left;  . INCISION AND DRAINAGE OF WOUND Left 07/12/2019   Procedure: LEFT HIP DEBRIDEMENT;  Surgeon: Newt Minion, MD;  Location:  [facility];  Service: Orthopedics;  Laterality: Left;  . IR PARACENTESIS  09/13/2019  . IR PARACENTESIS  10/17/2019  . IR US GUIDE BX ASP/DRAIN  06/17/2019  . IR US GUIDE BX ASP/DRAIN  09/13/2019  . KNEE ARTHROSCOPY Bilateral    left x 2, 1 right  . LAPAROSCOPIC CHOLECYSTECTOMY  ~ 2010  . LUMBAR DISC SURGERY    . NASAL SINUS SURGERY    . TEE WITHOUT CARDIOVERSION N/A 06/16/2019   Procedure: TRANSESOPHAGEAL ECHOCARDIOGRAM (TEE);  Surgeon: Dorothy Spark, MD;  Location: [facility] ENDOSCOPY;  Service: Cardiovascular;  Laterality: N/A;   Social History   Occupational History  . Occupation: Logistics  Tobacco Use  . Smoking status: Former Smoker    Types: Cigars    Quit date: 09/10/2017    Years since quitting: 2.1  . Smokeless tobacco: Never Used  Substance and Sexual Activity  . Alcohol use: Not Currently  . Drug use: Not Currently  . Sexual activity: Not on file

## 2019-10-21 LAB — PATHOLOGIST SMEAR REVIEW

## 2019-10-22 LAB — CULTURE, BODY FLUID W GRAM STAIN -BOTTLE: Culture: NO GROWTH

## 2019-10-27 ENCOUNTER — Other Ambulatory Visit: Payer: Self-pay

## 2019-10-27 ENCOUNTER — Ambulatory Visit (INDEPENDENT_AMBULATORY_CARE_PROVIDER_SITE_OTHER): Payer: Self-pay | Admitting: Physician Assistant

## 2019-10-27 ENCOUNTER — Encounter: Payer: Self-pay | Admitting: Physician Assistant

## 2019-10-27 VITALS — Ht 74.0 in | Wt 350.0 lb

## 2019-10-27 DIAGNOSIS — S70222A Blister (nonthermal), left hip, initial encounter: Secondary | ICD-10-CM

## 2019-10-27 DIAGNOSIS — L089 Local infection of the skin and subcutaneous tissue, unspecified: Secondary | ICD-10-CM

## 2019-10-27 NOTE — Progress Notes (Signed)
Office Visit Note   Patient: Robert Sullivan           Date of Birth: 06-22-1966           MRN: [id number] Visit Date: 10/27/2019              Requested by: Cathleen Corti, PA-C [place],  [place] [address] PCP: Cathleen Corti, PA-C  Chief Complaint  Patient presents with  . Left Hip - Follow-up      HPI: This is a pleasant gentleman who follows up on his left hip 16 days status post I&D.  He feels he is getting less drainage he is still on antibiotics and rarely takes an oxycodone  Assessment & Plan: Visit Diagnoses: No diagnosis found.  Plan: Continue dressing changes follow-up 1 week overall I think he has some slow improvement  Follow-Up Instructions: No follow-ups on file.   Ortho Exam  Patient is alert, oriented, no adenopathy, well-dressed, normal affect, normal respiratory effort. Left hip incision has minimal drainage.  No foul odor.  No surrounding cellulitis.  The ends of the wound have healed still healing through the central portion  Imaging: No results found. No images are attached to the encounter.  Labs: Lab Results  Component Value Date   HGBA1C 4.6 (L) 09/12/2019   HGBA1C 5.5 07/10/2019   HGBA1C 11.0 (H) 05/13/2019   ESRSEDRATE 12 10/06/2019   ESRSEDRATE 15 09/12/2019   ESRSEDRATE 12 07/11/2019   CRP 2.8 (H) 10/06/2019   CRP 1.9 (H) 09/25/2019   CRP 1.8 (H) 09/24/2019   REPTSTATUS 10/22/2019 FINAL 10/17/2019   REPTSTATUS 10/17/2019 FINAL 10/17/2019   GRAMSTAIN  10/17/2019    WBC PRESENT, PREDOMINANTLY MONONUCLEAR NO ORGANISMS SEEN CYTOSPIN SMEAR Performed at [facility], [place] [address], [place], [place] [address]    CULT NO GROWTH 5 DAYS 10/17/2019   LABORGA ENTEROBACTER SPECIES 10/08/2019     Lab Results  Component Value Date   ALBUMIN 2.5 (L) 10/17/2019   ALBUMIN 2.4 (L) 10/16/2019   ALBUMIN 2.3 (L) 10/14/2019    Lab Results  Component Value Date   MG 1.8 10/18/2019   MG 1.8 10/17/2019   MG 1.8  09/25/2019   No results found for: VD25OH  No results found for: PREALBUMIN CBC EXTENDED Latest Ref Rng & Units 10/17/2019 10/16/2019 10/14/2019  WBC 4.0 - 10.5 K/uL 7.2 13.7(H) 8.5  RBC 4.22 - 5.81 MIL/uL 2.94(L) 3.58(L) 3.19(L)  HGB 13.0 - 17.0 g/dL 8.7(L) 10.6(L) 9.3(L)  HCT 39.0 - 52.0 % 27.4(L) 32.9(L) 28.9(L)  PLT 150 - 400 K/uL PLATELET CLUMPS NOTED ON SMEAR, COUNT APPEARS DECREASED 157 98(L)  NEUTROABS 1.7 - 7.7 K/uL 3.9 9.4(H) -  LYMPHSABS 0.7 - 4.0 K/uL 1.6 1.9 -     Body mass index is 44.94 kg/m.  Orders:  No orders of the defined types were placed in this encounter.  No orders of the defined types were placed in this encounter.    Procedures: No procedures performed  Clinical Data: No additional findings.  ROS:  All other systems negative, except as noted in the HPI. Review of Systems  Objective: Vital Signs: Ht 6' 2"  (1.88 m)   Wt (!) 350 lb (158.8 kg)   BMI 44.94 kg/m   Specialty Comments:  No specialty comments available.  PMFS History: Patient Active Problem List   Diagnosis Date Noted  . Decompensated hepatic cirrhosis ([place]) 10/17/2019  . Anemia 10/09/2019  . AKI (acute kidney injury) ([place])  10/09/2019  . Wound dehiscence 10/08/2019  . Ascites   . Anasarca 09/12/2019  . Cirrhosis of liver with ascites ([place]) 09/12/2019  . Abscess, gluteal, left 09/12/2019  . Pleural effusion 09/12/2019  . Thrombocytopenia ([place]) 09/12/2019  . Blister of hip with infection 07/09/2019  . Abscess of left hip   . MSSA bacteremia 06/19/2019  . Uncontrolled diabetes mellitus ([place]) 06/10/2019  . Obesity, Class III, BMI 40-49.9 (morbid obesity) ([place]) 06/10/2019  . Cellulitis, gluteal, left 06/10/2019  . Sepsis secondary to UTI ([place]) 05/13/2019  . Acute lower UTI 05/13/2019  . Type 2 diabetes mellitus without complication ([place]) 95/32/0233  . Hip pain, acute, left 05/13/2019  . Sepsis ([place]) 05/13/2019  . Acute cystitis without hematuria   . Hyperglycemia    Past  Medical History:  Diagnosis Date  . Anemia   . Anxiety   . Arthritis 05/13/2019   knees  . Cirrhosis ([place])   . Depression   . Diabetes mellitus without complication ([place])   . DVT (deep venous thrombosis) ([place])   . Gallstones   . Gluteal abscess   . Headache   . HTN (hypertension)   . Hyperlipidemia   . Sleep apnea    doesn't use cpap  . Splenomegaly     Family History  Problem Relation Age of Onset  . Hypertension Father     Past Surgical History:  Procedure Laterality Date  . I & D EXTREMITY Left 07/16/2019   Procedure: LEFT HIP DEBRIDEMENT;  Surgeon: Newt Minion, MD;  Location: [facility];  Service: Orthopedics;  Laterality: Left;  . I & D EXTREMITY Left 07/18/2019   Procedure: REPEAT IRRIGATION AND DEBRIDEMENT LEFT HIP;  Surgeon: Newt Minion, MD;  Location: [facility];  Service: Orthopedics;  Laterality: Left;  . I & D EXTREMITY Left 09/19/2019   Procedure: DEBRIDEMENT LEFT HIP Appilcattion OF WOUND VAC;  Surgeon: Newt Minion, MD;  Location: [facility];  Service: Orthopedics;  Laterality: Left;  . I & D EXTREMITY Left 10/08/2019   Procedure: IRRIGATION AND DEBRIDEMENT, REVISION LEFT HIP WOUND;  Surgeon: Newt Minion, MD;  Location: [facility];  Service: Orthopedics;  Laterality: Left;  . INCISION AND DRAINAGE ABSCESS Left 06/23/2019   Procedure: INCISION AND DRAINAGE GLUTEAL ABSCESS;  Surgeon: Renette Butters, MD;  Location: [facility];  Service: Orthopedics;  Laterality: Left;  . INCISION AND DRAINAGE ABSCESS Left 07/09/2019   Procedure: INCISION AND DRAINAGE LEFT HIP/PELVIC ABSCESS, APPLICATION OF NEGATIVE PRESSURE WOUND VAC;  Surgeon: Newt Minion, MD;  Location: [facility];  Service: Orthopedics;  Laterality: Left;  . INCISION AND DRAINAGE HIP Left 06/25/2019   Procedure: IRRIGATION AND DEBRIDEMENT HIP;  Surgeon: Shona Needles, MD;  Location: [facility];  Service: Orthopedics;  Laterality: Left;  . INCISION AND DRAINAGE HIP Left 10/11/2019   Procedure: IRRIGATION AND DEBRIDEMENT HIP;  Surgeon:  Newt Minion, MD;  Location: [facility];  Service: Orthopedics;  Laterality: Left;  . INCISION AND DRAINAGE OF WOUND Left 07/12/2019   Procedure: LEFT HIP DEBRIDEMENT;  Surgeon: Newt Minion, MD;  Location: [facility];  Service: Orthopedics;  Laterality: Left;  . IR PARACENTESIS  09/13/2019  . IR PARACENTESIS  10/17/2019  . IR US GUIDE BX ASP/DRAIN  06/17/2019  . IR US GUIDE BX ASP/DRAIN  09/13/2019  . KNEE ARTHROSCOPY Bilateral    left x 2, 1 right  . LAPAROSCOPIC CHOLECYSTECTOMY  ~ 2010  . LUMBAR DISC SURGERY    . NASAL SINUS SURGERY    .  TEE WITHOUT CARDIOVERSION N/A 06/16/2019   Procedure: TRANSESOPHAGEAL ECHOCARDIOGRAM (TEE);  Surgeon: Dorothy Spark, MD;  Location[facility];  Service: Cardiovascular;  Laterality: N/A;   Social History   Occupational History  . Occupation: Logistics  Tobacco Use  . Smoking status: Former Smoker    Types: Cigars    Quit date: 09/10/2017    Years since quitting: 2.1  . Smokeless tobacco: Never Used  Substance and Sexual Activity  . Alcohol use: Not Currently  . Drug use: Not Currently  . Sexual activity: Not on file

## 2019-10-29 LAB — ACID FAST CULTURE WITH REFLEXED SENSITIVITIES (MYCOBACTERIA): Acid Fast Culture: NEGATIVE

## 2019-10-30 ENCOUNTER — Other Ambulatory Visit: Payer: Self-pay

## 2019-10-30 ENCOUNTER — Encounter: Payer: Self-pay | Admitting: Internal Medicine

## 2019-10-30 ENCOUNTER — Ambulatory Visit (INDEPENDENT_AMBULATORY_CARE_PROVIDER_SITE_OTHER): Payer: Self-pay | Admitting: Internal Medicine

## 2019-10-30 DIAGNOSIS — T8130XA Disruption of wound, unspecified, initial encounter: Secondary | ICD-10-CM

## 2019-10-30 NOTE — Assessment & Plan Note (Signed)
His most recent operative findings did not suggest recurrent abscess.  It is difficult to know if the Enterobacter is simply colonizing his large dead space wound or whether it is a factor in poor wound healing.  I discussed uncertainty about optimal duration of treatment with ciprofloxacin with him today.  I will continue ciprofloxacin for now and see him back in 3 weeks.

## 2019-10-30 NOTE — Progress Notes (Signed)
[facility] for [facility]ious [facility]  Patient Active Problem List   Diagnosis Date Noted  . Decompensated hepatic cirrhosis ([place]) 10/17/2019  . Anemia 10/09/2019  . AKI (acute kidney injury) ([place]) 10/09/2019  . Wound dehiscence 10/08/2019  . Ascites   . Anasarca 09/12/2019  . Cirrhosis of liver with ascites ([place]) 09/12/2019  . Abscess, gluteal, left 09/12/2019  . Pleural effusion 09/12/2019  . Thrombocytopenia ([place]) 09/12/2019  . Blister of hip with infection 07/09/2019  . Abscess of left hip   . MSSA bacteremia 06/19/2019  . Uncontrolled diabetes mellitus ([place]) 06/10/2019  . Obesity, Class III, BMI 40-49.9 (morbid obesity) ([place]) 06/10/2019  . Cellulitis, gluteal, left 06/10/2019  . Sepsis secondary to UTI ([place]) 05/13/2019  . Acute lower UTI 05/13/2019  . Type 2 diabetes mellitus without complication ([place]) 59/16/3846  . Hip pain, acute, left 05/13/2019  . Sepsis ([place]) 05/13/2019  . Acute cystitis without hematuria   . Hyperglycemia     Patient's Medications  New Prescriptions   No medications on file  Previous Medications   BLOOD GLUCOSE METER KIT AND SUPPLIES KIT    Dispense based on patient and insurance preference. Use up to four times daily as directed. (FOR ICD-9 250.00, 250.01).   CIPROFLOXACIN (CIPRO) 500 MG TABLET    Take 1 tablet (500 mg total) by mouth 2 (two) times daily for 18 days. continue antibiotics through 11/01/19   ENSURE MAX PROTEIN (ENSURE MAX PROTEIN) LIQD    Take 330 mLs (11 oz total) by mouth daily.   FUROSEMIDE (LASIX) 40 MG TABLET    Take 1 tablet (40 mg total) by mouth daily.   INSULIN ASPART (NOVOLOG) 100 UNIT/ML INJECTION    Inject 3 Units into the skin in the morning and at bedtime.    INSULIN GLARGINE (LANTUS) 100 UNIT/ML INJECTION    Inject 0.15 mLs (15 Units total) into the skin daily as needed (only if BGL is 120 or greater).   INSULIN SYRINGES, DISPOSABLE, U-100 0.5 ML MISC    Use as directed 3 times daily AC.   METHOCARBAMOL  (ROBAXIN) 500 MG TABLET    Take 1 tablet (500 mg total) by mouth every 6 (six) hours as needed for muscle spasms.   MULTIPLE VITAMIN (MULTIVITAMIN WITH MINERALS) TABS TABLET    Take 1 tablet by mouth daily.   ONDANSETRON (ZOFRAN) 4 MG TABLET    Take 1 tablet (4 mg total) by mouth every 6 (six) hours as needed for nausea.   OXYCODONE (OXY IR/ROXICODONE) 5 MG IMMEDIATE RELEASE TABLET    Take 1 tablet (5 mg total) by mouth every 4 (four) hours as needed for moderate pain (pain score 4-6).   POLYETHYLENE GLYCOL (MIRALAX / GLYCOLAX) 17 G PACKET    Take 17 g by mouth daily as needed for mild constipation.   POTASSIUM CHLORIDE SA (KLOR-CON) 20 MEQ TABLET    Take 1 tablet (20 mEq total) by mouth daily.   SPIRONOLACTONE (ALDACTONE) 100 MG TABLET    Take 1 tablet (100 mg total) by mouth daily.  Modified Medications   No medications on file  Discontinued Medications   FERROUS SULFATE 325 (65 FE) MG TABLET    Take 1 tablet (325 mg total) by mouth daily with breakfast.    Subjective: Jarrett is in for his hospital follow-up visit.  He was hospitalized in early December with MSSA bacteremia and a large left gluteal abscess.  He underwent incision and drainage.  Repeat blood  cultures and TEE were negative.  He was discharged on IV cefazolin with a plan for 6 weeks of therapy but had worsening of the abscess leading to readmission on 07/09/2019.  He underwent repeat drainage.  Abscess cultures grew Enterobacter he was discharged on IV cefepime and treated for 4 weeks.  He appeared to be doing well but then developed anasarca.  Previous scans that suggested cirrhosis.  He was readmitted on 09/11/2019.  Repeat abdominal CT showed ascites and recurrent left gluteal abscess.  He underwent incision and drainage again.  Cultures grew Enterococcus.  He was discharged on IV ampicillin.  He developed wound dehiscence leading to readmission on 10/07/2019.  He underwent another surgery and cultures grew Enterobacter again.  He was  on IV cefepime while hospitalized but discharged on oral ciprofloxacin.  He has now completed 19 days of therapy for Enterobacter.  He was readmitted to the hospital on 10/17/2019 for repeat paracentesis.  Since all of this began he has drastically changed his diet to a strictly plant-based diet.  He has lost approximately 100 pounds.  He has been diagnosed with nonalcoholic steatotic cirrhosis.  Review of Systems: Review of Systems  Constitutional: Positive for malaise/fatigue and weight loss. Negative for chills, diaphoresis and fever.  Respiratory: Negative for cough.   Gastrointestinal: Negative for abdominal pain, diarrhea, nausea and vomiting.    Past Medical History:  Diagnosis Date  . Anemia   . Anxiety   . Arthritis 05/13/2019   knees  . Cirrhosis ([place])   . Depression   . Diabetes mellitus without complication ([place])   . DVT (deep venous thrombosis) ([place])   . Gallstones   . Gluteal abscess   . Headache   . HTN (hypertension)   . Hyperlipidemia   . Sleep apnea    doesn't use cpap  . Splenomegaly     Social History   Tobacco Use  . Smoking status: Former Smoker    Types: Cigars    Quit date: 09/10/2017    Years since quitting: 2.1  . Smokeless tobacco: Never Used  Substance Use Topics  . Alcohol use: Not Currently  . Drug use: Not Currently    Family History  Problem Relation Age of Onset  . Hypertension Father     Allergies  Allergen Reactions  . Sulfa Antibiotics Rash and Other (See Comments)    Total body rash    Objective: Vitals:   10/30/19 0946  BP: (!) 134/96  Pulse: (!) 125  Temp: 97.6 F (36.4 C)  SpO2: 99%  Weight: 293 lb (132.9 kg)   Body mass index is 37.62 kg/m.  Physical Exam Constitutional:      Comments: His spirits are better and he looks like he is feeling much better than when he was in the hospital.  Musculoskeletal:     Comments: He has pictures of his left hip wound taken yesterday.  His sutures remain in place.  The  proximal and distal edges of the wound appeared to be healing.  The central portion remains open but serial pictures since discharge show improvement.  He has had a marked decrease in wound drainage.       Problem List Items Addressed This Visit      Unprioritized   Wound dehiscence    His most recent operative findings did not suggest recurrent abscess.  It is difficult to know if the Enterobacter is simply colonizing his large dead space wound or whether it is a factor in poor wound  healing.  I discussed uncertainty about optimal duration of treatment with ciprofloxacin with him today.  I will continue ciprofloxacin for now and see him back in 3 weeks.          Michel Bickers, MD [facility] [phone] pager   [phone] cell 10/30/2019, 11:50 AM

## 2019-11-04 ENCOUNTER — Ambulatory Visit (INDEPENDENT_AMBULATORY_CARE_PROVIDER_SITE_OTHER): Payer: Self-pay | Admitting: Physician Assistant

## 2019-11-04 ENCOUNTER — Other Ambulatory Visit: Payer: Self-pay

## 2019-11-04 ENCOUNTER — Encounter: Payer: Self-pay | Admitting: Physician Assistant

## 2019-11-04 VITALS — Ht 74.0 in | Wt 293.0 lb

## 2019-11-04 DIAGNOSIS — T8130XA Disruption of wound, unspecified, initial encounter: Secondary | ICD-10-CM

## 2019-11-04 MED ORDER — CIPROFLOXACIN HCL 500 MG PO TABS: 500.0000 mg | ORAL_TABLET | Freq: Two times a day (BID) | ORAL | 1 refills | Status: AC

## 2019-11-04 NOTE — Progress Notes (Signed)
Office Visit Note   Patient: Robert Kanaan           Date of Birth: 1966-04-02           MRN: [id number] Visit Date: 11/04/2019              Requested by: Cathleen Corti, PA-C [place],  [place] [address] PCP: Cathleen Corti, PA-C  Chief Complaint  Patient presents with  . Left Hip - Routine Post Op    10/11/19 I&D left hip      HPI: This is a pleasant gentleman who is now 4 weeks status post repeat irrigation and debridement of his left hip.  He recently saw infectious disease and they recommended continuing Cipro for now.  They are going to follow-up with him in 3 weeks.  He does say he is having less drainage from the wound.  He has lost 100 pounds.  Assessment & Plan: Visit Diagnoses: No diagnosis found.  Plan: I have refilled his Cipro.  Continue with daily wound changes.  Follow-up in 1 week.  Follow-Up Instructions: No follow-ups on file.   Ortho Exam  Patient is alert, oriented, no adenopathy, well-dressed, normal affect, normal respiratory effort. The ends of the wound have healed I did remove 2 sutures distally.  There is no necrosis.  The central 9 cm of the wound has still not healed.  There is no foul odor no surrounding cellulitis there is some irritation from the suture  Imaging: No results found.   Labs: Lab Results  Component Value Date   HGBA1C 4.6 (L) 09/12/2019   HGBA1C 5.5 07/10/2019   HGBA1C 11.0 (H) 05/13/2019   ESRSEDRATE 12 10/06/2019   ESRSEDRATE 15 09/12/2019   ESRSEDRATE 12 07/11/2019   CRP 2.8 (H) 10/06/2019   CRP 1.9 (H) 09/25/2019   CRP 1.8 (H) 09/24/2019   REPTSTATUS 10/22/2019 FINAL 10/17/2019   REPTSTATUS 10/17/2019 FINAL 10/17/2019   GRAMSTAIN  10/17/2019    WBC PRESENT, PREDOMINANTLY MONONUCLEAR NO ORGANISMS SEEN CYTOSPIN SMEAR Performed at [facility], [place] [address] [place], [place] [address]    CULT NO GROWTH 5 DAYS 10/17/2019   LABORGA ENTEROBACTER SPECIES 10/08/2019     Lab Results    Component Value Date   ALBUMIN 2.5 (L) 10/17/2019   ALBUMIN 2.4 (L) 10/16/2019   ALBUMIN 2.3 (L) 10/14/2019    Lab Results  Component Value Date   MG 1.8 10/18/2019   MG 1.8 10/17/2019   MG 1.8 09/25/2019   No results found for: VD25OH  No results found for: PREALBUMIN CBC EXTENDED Latest Ref Rng & Units 10/17/2019 10/16/2019 10/14/2019  WBC 4.0 - 10.5 K/uL 7.2 13.7(H) 8.5  RBC 4.22 - 5.81 MIL/uL 2.94(L) 3.58(L) 3.19(L)  HGB 13.0 - 17.0 g/dL 8.7(L) 10.6(L) 9.3(L)  HCT 39.0 - 52.0 % 27.4(L) 32.9(L) 28.9(L)  PLT 150 - 400 K/uL PLATELET CLUMPS NOTED ON SMEAR, COUNT APPEARS DECREASED 157 98(L)  NEUTROABS 1.7 - 7.7 K/uL 3.9 9.4(H) -  LYMPHSABS 0.7 - 4.0 K/uL 1.6 1.9 -     Body mass index is 37.62 kg/m.  Orders:  No orders of the defined types were placed in this encounter.  Meds ordered this encounter  Medications  . ciprofloxacin (CIPRO) 500 MG tablet    Sig: Take 1 tablet (500 mg total) by mouth 2 (two) times daily.    Dispense:  60 tablet    Refill:  1     Procedures: No procedures performed  Clinical Data: No additional findings.  ROS:  All other systems negative, except as noted in the HPI. Review of Systems  Objective: Vital Signs: Ht 6' 2"  (1.88 m)   Wt 293 lb (132.9 kg)   BMI 37.62 kg/m   Specialty Comments:  No specialty comments available.  PMFS History: Patient Active Problem List   Diagnosis Date Noted  . Decompensated hepatic cirrhosis ([place]) 10/17/2019  . Anemia 10/09/2019  . AKI (acute kidney injury) ([place]) 10/09/2019  . Wound dehiscence 10/08/2019  . Ascites   . Anasarca 09/12/2019  . Cirrhosis of liver with ascites ([place]) 09/12/2019  . Abscess, gluteal, left 09/12/2019  . Pleural effusion 09/12/2019  . Thrombocytopenia ([place]) 09/12/2019  . Blister of hip with infection 07/09/2019  . Abscess of left hip   . MSSA bacteremia 06/19/2019  . Uncontrolled diabetes mellitus ([place]) 06/10/2019  . Obesity, Class III, BMI 40-49.9 (morbid obesity)  ([place]) 06/10/2019  . Cellulitis, gluteal, left 06/10/2019  . Sepsis secondary to UTI ([place]) 05/13/2019  . Acute lower UTI 05/13/2019  . Type 2 diabetes mellitus without complication ([place]) 22/97/9892  . Hip pain, acute, left 05/13/2019  . Sepsis ([place]) 05/13/2019  . Acute cystitis without hematuria   . Hyperglycemia    Past Medical History:  Diagnosis Date  . Anemia   . Anxiety   . Arthritis 05/13/2019   knees  . Cirrhosis ([place])   . Depression   . Diabetes mellitus without complication ([place])   . DVT (deep venous thrombosis) ([place])   . Gallstones   . Gluteal abscess   . Headache   . HTN (hypertension)   . Hyperlipidemia   . Sleep apnea    doesn't use cpap  . Splenomegaly     Family History  Problem Relation Age of Onset  . Hypertension Father     Past Surgical History:  Procedure Laterality Date  . I & D EXTREMITY Left 07/16/2019   Procedure: LEFT HIP DEBRIDEMENT;  Surgeon: Newt Minion, MD;  Location: [facility];  Service: Orthopedics;  Laterality: Left;  . I & D EXTREMITY Left 07/18/2019   Procedure: REPEAT IRRIGATION AND DEBRIDEMENT LEFT HIP;  Surgeon: Newt Minion, MD;  Location: [facility];  Service: Orthopedics;  Laterality: Left;  . I & D EXTREMITY Left 09/19/2019   Procedure: DEBRIDEMENT LEFT HIP Appilcattion OF WOUND VAC;  Surgeon: Newt Minion, MD;  Location: [facility];  Service: Orthopedics;  Laterality: Left;  . I & D EXTREMITY Left 10/08/2019   Procedure: IRRIGATION AND DEBRIDEMENT, REVISION LEFT HIP WOUND;  Surgeon: Newt Minion, MD;  Location: [facility];  Service: Orthopedics;  Laterality: Left;  . INCISION AND DRAINAGE ABSCESS Left 06/23/2019   Procedure: INCISION AND DRAINAGE GLUTEAL ABSCESS;  Surgeon: Renette Butters, MD;  Location: [facility];  Service: Orthopedics;  Laterality: Left;  . INCISION AND DRAINAGE ABSCESS Left 07/09/2019   Procedure: INCISION AND DRAINAGE LEFT HIP/PELVIC ABSCESS, APPLICATION OF NEGATIVE PRESSURE WOUND VAC;  Surgeon: Newt Minion, MD;  Location:  [facility];  Service: Orthopedics;  Laterality: Left;  . INCISION AND DRAINAGE HIP Left 06/25/2019   Procedure: IRRIGATION AND DEBRIDEMENT HIP;  Surgeon: Shona Needles, MD;  Location: [facility];  Service: Orthopedics;  Laterality: Left;  . INCISION AND DRAINAGE HIP Left 10/11/2019   Procedure: IRRIGATION AND DEBRIDEMENT HIP;  Surgeon: Newt Minion, MD;  Location: [facility];  Service: Orthopedics;  Laterality: Left;  . INCISION AND DRAINAGE OF WOUND Left 07/12/2019   Procedure: LEFT  HIP DEBRIDEMENT;  Surgeon: Newt Minion, MD;  Location: [facility];  Service: Orthopedics;  Laterality: Left;  . IR PARACENTESIS  09/13/2019  . IR PARACENTESIS  10/17/2019  . IR US GUIDE BX ASP/DRAIN  06/17/2019  . IR US GUIDE BX ASP/DRAIN  09/13/2019  . KNEE ARTHROSCOPY Bilateral    left x 2, 1 right  . LAPAROSCOPIC CHOLECYSTECTOMY  ~ 2010  . LUMBAR DISC SURGERY    . NASAL SINUS SURGERY    . TEE WITHOUT CARDIOVERSION N/A 06/16/2019   Procedure: TRANSESOPHAGEAL ECHOCARDIOGRAM (TEE);  Surgeon: Dorothy Spark, MD;  Location[facility] END[facility]COPY;  Service: Cardiovascular;  Laterality: N/A;   Social History   Occupational History  . Occupation: Logistics  Tobacco Use  . Smoking status: Former Smoker    Types: Cigars    Quit date: 09/10/2017    Years since quitting: 2.1  . Smokeless tobacco: Never Used  Substance and Sexual Activity  . Alcohol use: Not Currently  . Drug use: Not Currently  . Sexual activity: Not on file

## 2019-11-06 ENCOUNTER — Ambulatory Visit: Payer: Self-pay | Admitting: Gastroenterology

## 2019-11-10 ENCOUNTER — Telehealth: Payer: Self-pay | Admitting: Physician Assistant

## 2019-11-10 NOTE — Telephone Encounter (Signed)
Patient called requesting a call back about wound care. Patient states he was transferred to triage nurse and there was no answer. Patient would like a call back from either Dr. Sharol Given or PA Persons. Patient phone number is [phone].

## 2019-11-10 NOTE — Telephone Encounter (Signed)
I called pt and he states that his incision looks worse. Made an appt for the pt to come in tomorrow at 1:30

## 2019-11-11 ENCOUNTER — Other Ambulatory Visit: Payer: Self-pay

## 2019-11-11 ENCOUNTER — Encounter: Payer: Self-pay | Admitting: Physician Assistant

## 2019-11-11 ENCOUNTER — Ambulatory Visit (INDEPENDENT_AMBULATORY_CARE_PROVIDER_SITE_OTHER): Payer: Self-pay | Admitting: Orthopedic Surgery

## 2019-11-11 VITALS — Ht 74.0 in | Wt 293.0 lb

## 2019-11-11 DIAGNOSIS — T8130XA Disruption of wound, unspecified, initial encounter: Secondary | ICD-10-CM

## 2019-11-11 MED ORDER — OXYCODONE HCL 5 MG PO TABS: 5.0000 mg | ORAL_TABLET | ORAL | 0 refills | Status: DC | PRN

## 2019-11-12 ENCOUNTER — Encounter: Payer: Self-pay | Admitting: Orthopedic Surgery

## 2019-11-12 NOTE — Progress Notes (Signed)
Office Visit Note   Patient: Robert Sullivan           Date of Birth: 1966/03/22           MRN: [id number] Visit Date: 11/11/2019              Requested by: Cathleen Corti, PA-C [place],  [place] [address] PCP: Cathleen Corti, PA-C  Chief Complaint  Patient presents with  . Left Hip - Routine Post Op    10/11/19 I&D left hip       HPI: Patient is a 54 year old gentleman who presents in follow-up status post repeat irrigation debridement of the left hip.  Patient states he is having progressive wound dehiscence of the surgical incision.  Assessment & Plan: Visit Diagnoses: No diagnosis found.  Plan: I discussed with the patient and his wife over the phone only 2 options.  Discussed that my recommendation would be to proceed with a revision debridement wound closure and placement of a wound VAC.  Patient and his wife state that they are reluctant to proceed with repeat debridement at this time they are inquire about packing the wound open.  Discussed that we could proceed with packing the wound open for a week and see how he progresses.  The technique of packing the wound was demonstrated to the patient he was able to photograph the steps and we will follow-up in 1 week.  Follow-Up Instructions: Return in about 1 week (around 11/18/2019).   Ortho Exam  Patient is alert, oriented, no adenopathy, well-dressed, normal affect, normal respiratory effort. Examination the proximal distal aspect of the surgical incision on the left thigh has healed nicely patient does have progressive wound dehiscence of the center of the incision there appears to be a biofilm along the wound edges.  There is no cellulitis no odor no drainage.  Imaging: No results found. No images are attached to the encounter.  Labs: Lab Results  Component Value Date   HGBA1C 4.6 (L) 09/12/2019   HGBA1C 5.5 07/10/2019   HGBA1C 11.0 (H) 05/13/2019   ESRSEDRATE 12 10/06/2019   ESRSEDRATE 15 09/12/2019     ESRSEDRATE 12 07/11/2019   CRP 2.8 (H) 10/06/2019   CRP 1.9 (H) 09/25/2019   CRP 1.8 (H) 09/24/2019   REPTSTATUS 10/22/2019 FINAL 10/17/2019   REPTSTATUS 10/17/2019 FINAL 10/17/2019   GRAMSTAIN  10/17/2019    WBC PRESENT, PREDOMINANTLY MONONUCLEAR NO ORGANISMS SEEN CYTOSPIN SMEAR Performed at [facility], [place] [address], [place], [place] [address]    CULT NO GROWTH 5 DAYS 10/17/2019   LABORGA ENTEROBACTER SPECIES 10/08/2019     Lab Results  Component Value Date   ALBUMIN 2.5 (L) 10/17/2019   ALBUMIN 2.4 (L) 10/16/2019   ALBUMIN 2.3 (L) 10/14/2019    Lab Results  Component Value Date   MG 1.8 10/18/2019   MG 1.8 10/17/2019   MG 1.8 09/25/2019   No results found for: VD25OH  No results found for: PREALBUMIN CBC EXTENDED Latest Ref Rng & Units 10/17/2019 10/16/2019 10/14/2019  WBC 4.0 - 10.5 K/uL 7.2 13.7(H) 8.5  RBC 4.22 - 5.81 MIL/uL 2.94(L) 3.58(L) 3.19(L)  HGB 13.0 - 17.0 g/dL 8.7(L) 10.6(L) 9.3(L)  HCT 39.0 - 52.0 % 27.4(L) 32.9(L) 28.9(L)  PLT 150 - 400 K/uL PLATELET CLUMPS NOTED ON SMEAR, COUNT APPEARS DECREASED 157 98(L)  NEUTROABS 1.7 - 7.7 K/uL 3.9 9.4(H) -  LYMPHSABS 0.7 - 4.0 K/uL 1.6 1.9 -     Body  mass index is 37.62 kg/m.  Orders:  No orders of the defined types were placed in this encounter.  Meds ordered this encounter  Medications  . oxyCODONE (OXY IR/ROXICODONE) 5 MG immediate release tablet    Sig: Take 1 tablet (5 mg total) by mouth every 4 (four) hours as needed for moderate pain (pain score 4-6).    Dispense:  30 tablet    Refill:  0     Procedures: No procedures performed  Clinical Data: No additional findings.  ROS:  All other systems negative, except as noted in the HPI. Review of Systems  Objective: Vital Signs: Ht 6' 2"  (1.88 m)   Wt 293 lb (132.9 kg)   BMI 37.62 kg/m   Specialty Comments:  No specialty comments available.  PMFS History: Patient Active Problem List   Diagnosis Date Noted  . Decompensated  hepatic cirrhosis ([place]) 10/17/2019  . Anemia 10/09/2019  . AKI (acute kidney injury) ([place]) 10/09/2019  . Wound dehiscence 10/08/2019  . Ascites   . Anasarca 09/12/2019  . Cirrhosis of liver with ascites ([place]) 09/12/2019  . Abscess, gluteal, left 09/12/2019  . Pleural effusion 09/12/2019  . Thrombocytopenia ([place]) 09/12/2019  . Blister of hip with infection 07/09/2019  . Abscess of left hip   . MSSA bacteremia 06/19/2019  . Uncontrolled diabetes mellitus ([place]) 06/10/2019  . Obesity, Class III, BMI 40-49.9 (morbid obesity) ([place]) 06/10/2019  . Cellulitis, gluteal, left 06/10/2019  . Sepsis secondary to UTI ([place]) 05/13/2019  . Acute lower UTI 05/13/2019  . Type 2 diabetes mellitus without complication ([place]) 35/57/3220  . Hip pain, acute, left 05/13/2019  . Sepsis ([place]) 05/13/2019  . Acute cystitis without hematuria   . Hyperglycemia    Past Medical History:  Diagnosis Date  . Anemia   . Anxiety   . Arthritis 05/13/2019   knees  . Cirrhosis ([place])   . Depression   . Diabetes mellitus without complication ([place])   . DVT (deep venous thrombosis) ([place])   . Gallstones   . Gluteal abscess   . Headache   . HTN (hypertension)   . Hyperlipidemia   . Sleep apnea    doesn't use cpap  . Splenomegaly     Family History  Problem Relation Age of Onset  . Hypertension Father     Past Surgical History:  Procedure Laterality Date  . I & D EXTREMITY Left 07/16/2019   Procedure: LEFT HIP DEBRIDEMENT;  Surgeon: Newt Minion, MD;  Location: [facility];  Service: Orthopedics;  Laterality: Left;  . I & D EXTREMITY Left 07/18/2019   Procedure: REPEAT IRRIGATION AND DEBRIDEMENT LEFT HIP;  Surgeon: Newt Minion, MD;  Location: [facility];  Service: Orthopedics;  Laterality: Left;  . I & D EXTREMITY Left 09/19/2019   Procedure: DEBRIDEMENT LEFT HIP Appilcattion OF WOUND VAC;  Surgeon: Newt Minion, MD;  Location: [facility];  Service: Orthopedics;  Laterality: Left;  . I & D EXTREMITY Left 10/08/2019    Procedure: IRRIGATION AND DEBRIDEMENT, REVISION LEFT HIP WOUND;  Surgeon: Newt Minion, MD;  Location: [facility];  Service: Orthopedics;  Laterality: Left;  . INCISION AND DRAINAGE ABSCESS Left 06/23/2019   Procedure: INCISION AND DRAINAGE GLUTEAL ABSCESS;  Surgeon: Renette Butters, MD;  Location: [facility];  Service: Orthopedics;  Laterality: Left;  . INCISION AND DRAINAGE ABSCESS Left 07/09/2019   Procedure: INCISION AND DRAINAGE LEFT HIP/PELVIC ABSCESS, APPLICATION OF NEGATIVE PRESSURE WOUND VAC;  Surgeon: Newt Minion, MD;  [facility];  Service: Orthopedics;  Laterality: Left;  . INCISION AND DRAINAGE HIP Left 06/25/2019   Procedure: IRRIGATION AND DEBRIDEMENT HIP;  Surgeon: Shona Needles, MD;  Location: [facility];  Service: Orthopedics;  Laterality: Left;  . INCISION AND DRAINAGE HIP Left 10/11/2019   Procedure: IRRIGATION AND DEBRIDEMENT HIP;  Surgeon: Newt Minion, MD;  Location: [facility];  Service: Orthopedics;  Laterality: Left;  . INCISION AND DRAINAGE OF WOUND Left 07/12/2019   Procedure: LEFT HIP DEBRIDEMENT;  Surgeon: Newt Minion, MD;  Location: [facility];  Service: Orthopedics;  Laterality: Left;  . IR PARACENTESIS  09/13/2019  . IR PARACENTESIS  10/17/2019  . IR US GUIDE BX ASP/DRAIN  06/17/2019  . IR US GUIDE BX ASP/DRAIN  09/13/2019  . KNEE ARTHROSCOPY Bilateral    left x 2, 1 right  . LAPAROSCOPIC CHOLECYSTECTOMY  ~ 2010  . LUMBAR DISC SURGERY    . NASAL SINUS SURGERY    . TEE WITHOUT CARDIOVERSION N/A 06/16/2019   Procedure: TRANSESOPHAGEAL ECHOCARDIOGRAM (TEE);  Surgeon: Dorothy Spark, MD;  Location: [facility] ENDOSCOPY;  Service: Cardiovascular;  Laterality: N/A;   Social History   Occupational History  . Occupation: Logistics  Tobacco Use  . Smoking status: Former Smoker    Types: Cigars    Quit date: 09/10/2017    Years since quitting: 2.1  . Smokeless tobacco: Never Used  Substance and Sexual Activity  . Alcohol use: Not Currently  . Drug use: Not Currently  . Sexual  activity: Not on file

## 2019-11-13 ENCOUNTER — Ambulatory Visit: Payer: Self-pay | Admitting: Physician Assistant

## 2019-11-17 ENCOUNTER — Encounter: Payer: Self-pay | Admitting: Orthopedic Surgery

## 2019-11-17 ENCOUNTER — Other Ambulatory Visit: Payer: Self-pay

## 2019-11-17 ENCOUNTER — Ambulatory Visit (INDEPENDENT_AMBULATORY_CARE_PROVIDER_SITE_OTHER): Payer: Self-pay | Admitting: Physician Assistant

## 2019-11-17 VITALS — Ht 74.0 in | Wt 293.0 lb

## 2019-11-17 DIAGNOSIS — T8130XA Disruption of wound, unspecified, initial encounter: Secondary | ICD-10-CM

## 2019-11-17 NOTE — Progress Notes (Signed)
Office Visit Note   Patient: Robert Sullivan           Date of Birth: September 24, 1965           MRN: [id number] Visit Date: 11/17/2019              Requested by: Cathleen Corti, PA-C [place],  [place] [address] PCP: Cathleen Corti, PA-C  Chief Complaint  Patient presents with  . Left Hip - Routine Post Op    10/11/19 I&D left hip       HPI: This is a pleasant gentleman who is following up for his left hip. He is status post multiple irrigation and debridements to his left hip wound. It was decided at his last visit that we would try wet-to-dry dressing changes in hopes to avoid another surgery. Patient son has been doing this and has noticed that he has had less to pack. Patient feels he is doing much better  Assessment & Plan: Visit Diagnoses: No diagnosis found.  Plan: We will continue with wet-to-dry dressing changes and the patient will follow up in 1 week or sooner if he he notices any necrotic changes with the dressing changes  Follow-Up Instructions: No follow-ups on file.   Ortho Exam  Patient is alert, oriented, no adenopathy, well-dressed, normal affect, normal respiratory effort. Focused examination of the wound. It is 15 cm long by 6 cm wide by 5 cm deep. There is red granulation tissue on the base and up the sides there is no foul odor there is some pink drainage. No surrounding cellulitis.  Imaging: No results found. No images are attached to the encounter.  Labs: Lab Results  Component Value Date   HGBA1C 4.6 (L) 09/12/2019   HGBA1C 5.5 07/10/2019   HGBA1C 11.0 (H) 05/13/2019   ESRSEDRATE 12 10/06/2019   ESRSEDRATE 15 09/12/2019   ESRSEDRATE 12 07/11/2019   CRP 2.8 (H) 10/06/2019   CRP 1.9 (H) 09/25/2019   CRP 1.8 (H) 09/24/2019   REPTSTATUS 10/22/2019 FINAL 10/17/2019   REPTSTATUS 10/17/2019 FINAL 10/17/2019   GRAMSTAIN  10/17/2019    WBC PRESENT, PREDOMINANTLY MONONUCLEAR NO ORGANISMS SEEN CYTOSPIN SMEAR Performed at [facility], [place] [address], [place],  [address]    CULT NO GROWTH 5 DAYS 10/17/2019   LABORGA ENTEROBACTER SPECIES 10/08/2019     Lab Results  Component Value Date   ALBUMIN 2.5 (L) 10/17/2019   ALBUMIN 2.4 (L) 10/16/2019   ALBUMIN 2.3 (L) 10/14/2019    Lab Results  Component Value Date   MG 1.8 10/18/2019   MG 1.8 10/17/2019   MG 1.8 09/25/2019   No results found for: VD25OH  No results found for: PREALBUMIN CBC EXTENDED Latest Ref Rng & Units 10/17/2019 10/16/2019 10/14/2019  WBC 4.0 - 10.5 K/uL 7.2 13.7(H) 8.5  RBC 4.22 - 5.81 MIL/uL 2.94(L) 3.58(L) 3.19(L)  HGB 13.0 - 17.0 g/dL 8.7(L) 10.6(L) 9.3(L)  HCT 39.0 - 52.0 % 27.4(L) 32.9(L) 28.9(L)  PLT 150 - 400 K/uL PLATELET CLUMPS NOTED ON SMEAR, COUNT APPEARS DECREASED 157 98(L)  NEUTROABS 1.7 - 7.7 K/uL 3.9 9.4(H) -  LYMPHSABS 0.7 - 4.0 K/uL 1.6 1.9 -     Body mass index is 37.62 kg/m.  Orders:  No orders of the defined types were placed in this encounter.  No orders of the defined types were placed in this encounter.    Procedures: No procedures performed  Clinical Data: No additional findings.  ROS:  All other systems negative, except as noted in the HPI. Review of Systems  Objective: Vital Signs: Ht 6' 2"  (1.88 m)   Wt 293 lb (132.9 kg)   BMI 37.62 kg/m   Specialty Comments:  No specialty comments available.  PMFS History: Patient Active Problem List   Diagnosis Date Noted  . Decompensated hepatic cirrhosis ([place]) 10/17/2019  . Anemia 10/09/2019  . AKI (acute kidney injury) ([place]) 10/09/2019  . Wound dehiscence 10/08/2019  . Ascites   . Anasarca 09/12/2019  . Cirrhosis of liver with ascites ([place]) 09/12/2019  . Abscess, gluteal, left 09/12/2019  . Pleural effusion 09/12/2019  . Thrombocytopenia ([place]) 09/12/2019  . Blister of hip with infection 07/09/2019  . Abscess of left hip   . MSSA bacteremia 06/19/2019  . Uncontrolled diabetes mellitus ([place]) 06/10/2019  . Obesity, Class III, BMI  40-49.9 (morbid obesity) ([place]) 06/10/2019  . Cellulitis, gluteal, left 06/10/2019  . Sepsis secondary to UTI ([place]) 05/13/2019  . Acute lower UTI 05/13/2019  . Type 2 diabetes mellitus without complication ([place]) 44/97/5300  . Hip pain, acute, left 05/13/2019  . Sepsis ([place]) 05/13/2019  . Acute cystitis without hematuria   . Hyperglycemia    Past Medical History:  Diagnosis Date  . Anemia   . Anxiety   . Arthritis 05/13/2019   knees  . Cirrhosis ([place])   . Depression   . Diabetes mellitus without complication ([place])   . DVT (deep venous thrombosis) ([place])   . Gallstones   . Gluteal abscess   . Headache   . HTN (hypertension)   . Hyperlipidemia   . Sleep apnea    doesn't use cpap  . Splenomegaly     Family History  Problem Relation Age of Onset  . Hypertension Father     Past Surgical History:  Procedure Laterality Date  . I & D EXTREMITY Left 07/16/2019   Procedure: LEFT HIP DEBRIDEMENT;  Surgeon: Newt Minion, MD;  Location: [facility];  Service: Orthopedics;  Laterality: Left;  . I & D EXTREMITY Left 07/18/2019   Procedure: REPEAT IRRIGATION AND DEBRIDEMENT LEFT HIP;  Surgeon: Newt Minion, MD;  Location: [facility];  Service: Orthopedics;  Laterality: Left;  . I & D EXTREMITY Left 09/19/2019   Procedure: DEBRIDEMENT LEFT HIP Appilcattion OF WOUND VAC;  Surgeon: Newt Minion, MD;  Location: [facility];  Service: Orthopedics;  Laterality: Left;  . I & D EXTREMITY Left 10/08/2019   Procedure: IRRIGATION AND DEBRIDEMENT, REVISION LEFT HIP WOUND;  Surgeon: Newt Minion, MD;  Location: [facility];  Service: Orthopedics;  Laterality: Left;  . INCISION AND DRAINAGE ABSCESS Left 06/23/2019   Procedure: INCISION AND DRAINAGE GLUTEAL ABSCESS;  Surgeon: Renette Butters, MD;  Location: [facility];  Service: Orthopedics;  Laterality: Left;  . INCISION AND DRAINAGE ABSCESS Left 07/09/2019   Procedure: INCISION AND DRAINAGE LEFT HIP/PELVIC ABSCESS, APPLICATION OF NEGATIVE PRESSURE WOUND VAC;  Surgeon: Newt Minion, MD;  Location: [facility];  Service: Orthopedics;  Laterality: Left;  . INCISION AND DRAINAGE HIP Left 06/25/2019   Procedure: IRRIGATION AND DEBRIDEMENT HIP;  Surgeon: Shona Needles, MD;  Location: [facility];  Service: Orthopedics;  Laterality: Left;  . INCISION AND DRAINAGE HIP Left 10/11/2019   Procedure: IRRIGATION AND DEBRIDEMENT HIP;  Surgeon: Newt Minion, MD;  Location: [facility];  Service: Orthopedics;  Laterality: Left;  . INCISION AND DRAINAGE OF WOUND Left 07/12/2019   Procedure: LEFT HIP DEBRIDEMENT;  Surgeon: Newt Minion, MD;  Location: [facility];  Service: Orthopedics;  Laterality: Left;  . IR PARACENTESIS  09/13/2019  . IR PARACENTESIS  10/17/2019  . IR US GUIDE BX ASP/DRAIN  06/17/2019  . IR US GUIDE BX ASP/DRAIN  09/13/2019  . KNEE ARTHROSCOPY Bilateral    left x 2, 1 right  . LAPAROSCOPIC CHOLECYSTECTOMY  ~ 2010  . LUMBAR DISC SURGERY    . NASAL SINUS SURGERY    . TEE WITHOUT CARDIOVERSION N/A 06/16/2019   Procedure: TRANSESOPHAGEAL ECHOCARDIOGRAM (TEE);  Surgeon: Dorothy Spark, MD;  Location: [facility] ENDOSCOPY;  Service: Cardiovascular;  Laterality: N/A;   Social History   Occupational History  . Occupation: Logistics  Tobacco Use  . Smoking status: Former Smoker    Types: Cigars    Quit date: 09/10/2017    Years since quitting: 2.1  . Smokeless tobacco: Never Used  Substance and Sexual Activity  . Alcohol use: Not Currently  . Drug use: Not Currently  . Sexual activity: Not on file

## 2019-11-19 ENCOUNTER — Ambulatory Visit: Payer: Self-pay | Admitting: Internal Medicine

## 2019-11-24 ENCOUNTER — Ambulatory Visit: Payer: Self-pay | Admitting: Internal Medicine

## 2019-11-25 ENCOUNTER — Ambulatory Visit: Payer: Self-pay | Admitting: Physician Assistant

## 2019-11-25 ENCOUNTER — Other Ambulatory Visit: Payer: Self-pay | Admitting: Orthopedic Surgery

## 2019-11-25 ENCOUNTER — Telehealth: Payer: Self-pay | Admitting: Physician Assistant

## 2019-11-25 MED ORDER — OXYCODONE HCL 5 MG PO TABS: 5.0000 mg | ORAL_TABLET | Freq: Four times a day (QID) | ORAL | 0 refills | Status: DC | PRN

## 2019-11-25 NOTE — Telephone Encounter (Signed)
Patient called requesting a refill of oxycodone 5 mg. Please send to pharmacy on file. Patient phone number is [phone].

## 2019-11-25 NOTE — Telephone Encounter (Signed)
Please see message below and advise.

## 2019-11-25 NOTE — Telephone Encounter (Signed)
Rx sent 

## 2019-11-27 ENCOUNTER — Telehealth: Payer: Self-pay | Admitting: Orthopedic Surgery

## 2019-11-27 NOTE — Telephone Encounter (Signed)
Called and lm on vm to advise that yes the pt may proceed with covid vaccine.

## 2019-11-27 NOTE — Telephone Encounter (Signed)
Melissa from [facility] called  They need to know if it's okay for the patient to get the COVID vaccine.   Call back: [phone]

## 2019-12-01 ENCOUNTER — Ambulatory Visit: Payer: Self-pay | Admitting: Physician Assistant

## 2019-12-01 ENCOUNTER — Ambulatory Visit: Payer: Self-pay | Admitting: Internal Medicine

## 2019-12-03 ENCOUNTER — Encounter (HOSPITAL_COMMUNITY): Payer: Self-pay | Admitting: Internal Medicine

## 2019-12-04 ENCOUNTER — Other Ambulatory Visit: Payer: Self-pay

## 2019-12-04 ENCOUNTER — Encounter: Payer: Self-pay | Admitting: Physician Assistant

## 2019-12-04 ENCOUNTER — Ambulatory Visit (INDEPENDENT_AMBULATORY_CARE_PROVIDER_SITE_OTHER): Payer: Self-pay | Admitting: Physician Assistant

## 2019-12-04 DIAGNOSIS — T8130XA Disruption of wound, unspecified, initial encounter: Secondary | ICD-10-CM

## 2019-12-04 NOTE — Progress Notes (Signed)
Office Visit Note   Patient: Robert Sullivan           Date of Birth: 1966-07-06           MRN: [id number] Visit Date: 12/04/2019              Requested by: Cathleen Corti, PA-C [place],  [place] [address] PCP: Cathleen Corti, PA-C  Chief Complaint  Patient presents with  . Left Hip - Pain      HPI: Patient is here in follow-up today for his left hip wound.  He has been doing daily packing dressing changes.  He does feel like it is improving.  His son who has been doing the dressing changes also feels as though he is using less packing material.  He is also happy to report that he continues to lose weight and his liver functions are significantly lower though not normal yet.  He also has stabilized his hemoglobin at 12  Assessment & Plan: Visit Diagnoses: No diagnosis found.  Plan: Continue wet-to-dry packing dressing changes.  He will follow-up in 2 weeks.  He would like to see Dr. Sharol Given at that time as he is having knee pain and burning down his right thigh that he would like evaluated possible.  Follow-Up Instructions: No follow-ups on file.   Ortho Exam  Patient is alert, oriented, no adenopathy, well-dressed, normal affect, normal respiratory effort. Focused examination of his left thigh wound.  There is no cellulitis there is no foul odor.  There is an excellent healthy wound bed that is well vascular.  The size of the wound has  decreased especially in length and depth.  It is now 13 cm long 6-1/2 cm width and 3 cm depth  Imaging: No results found.   Labs: Lab Results  Component Value Date   HGBA1C 4.6 (L) 09/12/2019   HGBA1C 5.5 07/10/2019   HGBA1C 11.0 (H) 05/13/2019   ESRSEDRATE 12 10/06/2019   ESRSEDRATE 15 09/12/2019   ESRSEDRATE 12 07/11/2019   CRP 2.8 (H) 10/06/2019   CRP 1.9 (H) 09/25/2019   CRP 1.8 (H) 09/24/2019   REPTSTATUS 10/22/2019 FINAL 10/17/2019   REPTSTATUS 10/17/2019 FINAL 10/17/2019   GRAMSTAIN  10/17/2019    WBC PRESENT,  PREDOMINANTLY MONONUCLEAR NO ORGANISMS SEEN CYTOSPIN SMEAR Performed at [facility], [place] [address], [place], [place] [address]    CULT NO GROWTH 5 DAYS 10/17/2019   LABORGA ENTEROBACTER SPECIES 10/08/2019     Lab Results  Component Value Date   ALBUMIN 2.5 (L) 10/17/2019   ALBUMIN 2.4 (L) 10/16/2019   ALBUMIN 2.3 (L) 10/14/2019    Lab Results  Component Value Date   MG 1.8 10/18/2019   MG 1.8 10/17/2019   MG 1.8 09/25/2019   No results found for: VD25OH  No results found for: PREALBUMIN CBC EXTENDED Latest Ref Rng & Units 10/17/2019 10/16/2019 10/14/2019  WBC 4.0 - 10.5 K/uL 7.2 13.7(H) 8.5  RBC 4.22 - 5.81 MIL/uL 2.94(L) 3.58(L) 3.19(L)  HGB 13.0 - 17.0 g/dL 8.7(L) 10.6(L) 9.3(L)  HCT 39.0 - 52.0 % 27.4(L) 32.9(L) 28.9(L)  PLT 150 - 400 K/uL PLATELET CLUMPS NOTED ON SMEAR, COUNT APPEARS DECREASED 157 98(L)  NEUTROABS 1.7 - 7.7 K/uL 3.9 9.4(H) -  LYMPHSABS 0.7 - 4.0 K/uL 1.6 1.9 -     There is no height or weight on file to calculate BMI.  Orders:  No orders of the defined types were placed in this encounter.  No orders of the defined types were placed in this encounter.    Procedures: No procedures performed  Clinical Data: No additional findings.  ROS:  All other systems negative, except as noted in the HPI. Review of Systems  Objective: Vital Signs: There were no vitals taken for this visit.  Specialty Comments:  No specialty comments available.  PMFS History: Patient Active Problem List   Diagnosis Date Noted  . Decompensated hepatic cirrhosis ([place]) 10/17/2019  . Anemia 10/09/2019  . AKI (acute kidney injury) ([place]) 10/09/2019  . Wound dehiscence 10/08/2019  . Ascites   . Anasarca 09/12/2019  . Cirrhosis of liver with ascites ([place]) 09/12/2019  . Abscess, gluteal, left 09/12/2019  . Pleural effusion 09/12/2019  . Thrombocytopenia ([place]) 09/12/2019  . Blister of hip with infection 07/09/2019  . Abscess of left hip   . MSSA bacteremia  06/19/2019  . Uncontrolled diabetes mellitus ([place]) 06/10/2019  . Obesity, Class III, BMI 40-49.9 (morbid obesity) ([place]) 06/10/2019  . Cellulitis, gluteal, left 06/10/2019  . Sepsis secondary to UTI ([place]) 05/13/2019  . Acute lower UTI 05/13/2019  . Type 2 diabetes mellitus without complication ([place]) 24/58/0998  . Hip pain, acute, left 05/13/2019  . Sepsis ([place]) 05/13/2019  . Acute cystitis without hematuria   . Hyperglycemia    Past Medical History:  Diagnosis Date  . Anemia   . Anxiety   . Arthritis 05/13/2019   knees  . Cirrhosis ([place])   . Depression   . Diabetes mellitus without complication ([place])   . DVT (deep venous thrombosis) ([place])   . Gallstones   . Gluteal abscess   . Headache   . HTN (hypertension)   . Hyperlipidemia   . Sleep apnea    doesn't use cpap  . Splenomegaly     Family History  Problem Relation Age of Onset  . Hypertension Father     Past Surgical History:  Procedure Laterality Date  . I & D EXTREMITY Left 07/16/2019   Procedure: LEFT HIP DEBRIDEMENT;  Surgeon: Newt Minion, MD;  Location: [facility];  Service: Orthopedics;  Laterality: Left;  . I & D EXTREMITY Left 07/18/2019   Procedure: REPEAT IRRIGATION AND DEBRIDEMENT LEFT HIP;  Surgeon: Newt Minion, MD;  Location: [facility];  Service: Orthopedics;  Laterality: Left;  . I & D EXTREMITY Left 09/19/2019   Procedure: DEBRIDEMENT LEFT HIP Appilcattion OF WOUND VAC;  Surgeon: Newt Minion, MD;  Location: [facility];  Service: Orthopedics;  Laterality: Left;  . I & D EXTREMITY Left 10/08/2019   Procedure: IRRIGATION AND DEBRIDEMENT, REVISION LEFT HIP WOUND;  Surgeon: Newt Minion, MD;  Location: [facility];  Service: Orthopedics;  Laterality: Left;  . INCISION AND DRAINAGE ABSCESS Left 06/23/2019   Procedure: INCISION AND DRAINAGE GLUTEAL ABSCESS;  Surgeon: Renette Butters, MD;  Location: [facility];  Service: Orthopedics;  Laterality: Left;  . INCISION AND DRAINAGE ABSCESS Left 07/09/2019   Procedure: INCISION AND  DRAINAGE LEFT HIP/PELVIC ABSCESS, APPLICATION OF NEGATIVE PRESSURE WOUND VAC;  Surgeon: Newt Minion, MD;  Location: [facility];  Service: Orthopedics;  Laterality: Left;  . INCISION AND DRAINAGE HIP Left 06/25/2019   Procedure: IRRIGATION AND DEBRIDEMENT HIP;  Surgeon: Shona Needles, MD;  Location: [facility];  Service: Orthopedics;  Laterality: Left;  . INCISION AND DRAINAGE HIP Left 10/11/2019   Procedure: IRRIGATION AND DEBRIDEMENT HIP;  Surgeon: Newt Minion, MD;  Location: [facility];  Service: Orthopedics;  Laterality: Left;  . INCISION AND  DRAINAGE OF WOUND Left 07/12/2019   Procedure: LEFT HIP DEBRIDEMENT;  Surgeon: Newt Minion, MD;  Location: [facility];  Service: Orthopedics;  Laterality: Left;  . IR PARACENTESIS  09/13/2019  . IR PARACENTESIS  10/17/2019  . IR US GUIDE BX ASP/DRAIN  06/17/2019  . IR US GUIDE BX ASP/DRAIN  09/13/2019  . KNEE ARTHROSCOPY Bilateral    left x 2, 1 right  . LAPAROSCOPIC CHOLECYSTECTOMY  ~ 2010  . LUMBAR DISC SURGERY    . NASAL SINUS SURGERY    . TEE WITHOUT CARDIOVERSION N/A 06/16/2019   Procedure: TRANSESOPHAGEAL ECHOCARDIOGRAM (TEE);  Surgeon: Dorothy Spark, MD;  Location[facility] ENDOSCOPY;  Service: Cardiovascular;  Laterality: N/A;   Social History   Occupational History  . Occupation: Logistics  Tobacco Use  . Smoking status: Former Smoker    Types: Cigars    Quit date: 09/10/2017    Years since quitting: 2.2  . Smokeless tobacco: Never Used  Substance and Sexual Activity  . Alcohol use: Never  . Drug use: Never  . Sexual activity: Not on file

## 2019-12-05 NOTE — Progress Notes (Signed)
Addendum to 10/09/19 Consult Note:  Social History was noted as "not currently" using alcohol or drugs.  This autopopulated into the note as "He reports previous alcohol use.  He reports previous drug use."  Instead, it should have said no alcohol or drugs.  I apologize for the oversight and have corrected the [facility] so that this should not be an ongoing issue for this patient.    Carlyon Shadow, M.D.

## 2019-12-17 ENCOUNTER — Ambulatory Visit: Payer: Self-pay | Admitting: Physician Assistant

## 2019-12-18 ENCOUNTER — Ambulatory Visit: Payer: Self-pay | Admitting: Internal Medicine

## 2019-12-22 ENCOUNTER — Other Ambulatory Visit: Payer: Self-pay

## 2019-12-22 ENCOUNTER — Ambulatory Visit (INDEPENDENT_AMBULATORY_CARE_PROVIDER_SITE_OTHER): Payer: Self-pay | Admitting: Orthopedic Surgery

## 2019-12-22 ENCOUNTER — Encounter: Payer: Self-pay | Admitting: Physician Assistant

## 2019-12-22 VITALS — Ht 74.0 in | Wt 293.0 lb

## 2019-12-22 DIAGNOSIS — T8130XA Disruption of wound, unspecified, initial encounter: Secondary | ICD-10-CM

## 2019-12-22 NOTE — Progress Notes (Signed)
Office Visit Note   Patient: Robert Sullivan           Date of Birth: 1965-11-07           MRN: [id number] Visit Date: 12/22/2019              Requested by: Cathleen Corti, PA-C [place],  [place] [address] PCP: Cathleen Corti, PA-C  Chief Complaint  Patient presents with  . Left Hip - Routine Post Op    10/11/19 Left hip I&D      HPI: Patient is a 54 year old gentleman who presents follow-up status post repeat irrigation and debridement left hip patient is currently been doing wet-to-dry dressing changes for the chronic wound.  Patient states he is making good progress with the wet-to-dry dressing changes.  Assessment & Plan: Visit Diagnoses:  1. Wound dehiscence     Plan: Continue with current treatment Iodosorb and a dry dressings applied today.  Follow-Up Instructions: Return in about 3 weeks (around 01/12/2020).   Ortho Exam  Patient is alert, oriented, no adenopathy, well-dressed, normal affect, normal respiratory effort. Examination the left hip wound has excellent granulation tissue 100%.  Patient wound measures 4 x 11 cm and is 1 cm deep last visit it was 13 x 6.5 cm and 3 cm deep.  Imaging: No results found. No images are attached to the encounter.  Labs: Lab Results  Component Value Date   HGBA1C 4.6 (L) 09/12/2019   HGBA1C 5.5 07/10/2019   HGBA1C 11.0 (H) 05/13/2019   ESRSEDRATE 12 10/06/2019   ESRSEDRATE 15 09/12/2019   ESRSEDRATE 12 07/11/2019   CRP 2.8 (H) 10/06/2019   CRP 1.9 (H) 09/25/2019   CRP 1.8 (H) 09/24/2019   REPTSTATUS 10/22/2019 FINAL 10/17/2019   REPTSTATUS 10/17/2019 FINAL 10/17/2019   GRAMSTAIN  10/17/2019    WBC PRESENT, PREDOMINANTLY MONONUCLEAR NO ORGANISMS SEEN CYTOSPIN SMEAR Performed at [facility], [place] [address], [place], [place] [address]    CULT NO GROWTH 5 DAYS 10/17/2019   LABORGA ENTEROBACTER SPECIES 10/08/2019     Lab Results  Component Value Date   ALBUMIN 2.5 (L) 10/17/2019   ALBUMIN  2.4 (L) 10/16/2019   ALBUMIN 2.3 (L) 10/14/2019    Lab Results  Component Value Date   MG 1.8 10/18/2019   MG 1.8 10/17/2019   MG 1.8 09/25/2019   No results found for: VD25OH  No results found for: PREALBUMIN CBC EXTENDED Latest Ref Rng & Units 10/17/2019 10/16/2019 10/14/2019  WBC 4.0 - 10.5 K/uL 7.2 13.7(H) 8.5  RBC 4.22 - 5.81 MIL/uL 2.94(L) 3.58(L) 3.19(L)  HGB 13.0 - 17.0 g/dL 8.7(L) 10.6(L) 9.3(L)  HCT 39 - 52 % 27.4(L) 32.9(L) 28.9(L)  PLT 150 - 400 K/uL PLATELET CLUMPS NOTED ON SMEAR, COUNT APPEARS DECREASED 157 98(L)  NEUTROABS 1.7 - 7.7 K/uL 3.9 9.4(H) -  LYMPHSABS 0.7 - 4.0 K/uL 1.6 1.9 -     Body mass index is 37.62 kg/m.  Orders:  No orders of the defined types were placed in this encounter.  No orders of the defined types were placed in this encounter.    Procedures: No procedures performed  Clinical Data: No additional findings.  ROS:  All other systems negative, except as noted in the HPI. Review of Systems  Objective: Vital Signs: Ht 6' 2"  (1.88 m)   Wt 293 lb (132.9 kg)   BMI 37.62 kg/m   Specialty Comments:  No specialty comments available.  PMFS History: Patient Active  Problem List   Diagnosis Date Noted  . Decompensated hepatic cirrhosis ([place]) 10/17/2019  . Anemia 10/09/2019  . AKI (acute kidney injury) ([place]) 10/09/2019  . Wound dehiscence 10/08/2019  . Ascites   . Anasarca 09/12/2019  . Cirrhosis of liver with ascites ([place]) 09/12/2019  . Abscess, gluteal, left 09/12/2019  . Pleural effusion 09/12/2019  . Thrombocytopenia ([place]) 09/12/2019  . Blister of hip with infection 07/09/2019  . Abscess of left hip   . MSSA bacteremia 06/19/2019  . Uncontrolled diabetes mellitus ([place]) 06/10/2019  . Obesity, Class III, BMI 40-49.9 (morbid obesity) ([place]) 06/10/2019  . Cellulitis, gluteal, left 06/10/2019  . Sepsis secondary to UTI ([place]) 05/13/2019  . Acute lower UTI 05/13/2019  . Type 2 diabetes mellitus without complication ([place])  83/15/1761  . Hip pain, acute, left 05/13/2019  . Sepsis ([place]) 05/13/2019  . Acute cystitis without hematuria   . Hyperglycemia    Past Medical History:  Diagnosis Date  . Anemia   . Anxiety   . Arthritis 05/13/2019   knees  . Cirrhosis ([place])   . Depression   . Diabetes mellitus without complication ([place])   . DVT (deep venous thrombosis) ([place])   . Gallstones   . Gluteal abscess   . Headache   . HTN (hypertension)   . Hyperlipidemia   . Sleep apnea    doesn't use cpap  . Splenomegaly     Family History  Problem Relation Age of Onset  . Hypertension Father     Past Surgical History:  Procedure Laterality Date  . I & D EXTREMITY Left 07/16/2019   Procedure: LEFT HIP DEBRIDEMENT;  Surgeon: Newt Minion, MD;  Location: [facility];  Service: Orthopedics;  Laterality: Left;  . I & D EXTREMITY Left 07/18/2019   Procedure: REPEAT IRRIGATION AND DEBRIDEMENT LEFT HIP;  Surgeon: Newt Minion, MD;  Location: [facility];  Service: Orthopedics;  Laterality: Left;  . I & D EXTREMITY Left 09/19/2019   Procedure: DEBRIDEMENT LEFT HIP Appilcattion OF WOUND VAC;  Surgeon: Newt Minion, MD;  Location: [facility];  Service: Orthopedics;  Laterality: Left;  . I & D EXTREMITY Left 10/08/2019   Procedure: IRRIGATION AND DEBRIDEMENT, REVISION LEFT HIP WOUND;  Surgeon: Newt Minion, MD;  Location: [facility];  Service: Orthopedics;  Laterality: Left;  . INCISION AND DRAINAGE ABSCESS Left 06/23/2019   Procedure: INCISION AND DRAINAGE GLUTEAL ABSCESS;  Surgeon: Renette Butters, MD;  Location: [facility];  Service: Orthopedics;  Laterality: Left;  . INCISION AND DRAINAGE ABSCESS Left 07/09/2019   Procedure: INCISION AND DRAINAGE LEFT HIP/PELVIC ABSCESS, APPLICATION OF NEGATIVE PRESSURE WOUND VAC;  Surgeon: Newt Minion, MD;  Location: [facility];  Service: Orthopedics;  Laterality: Left;  . INCISION AND DRAINAGE HIP Left 06/25/2019   Procedure: IRRIGATION AND DEBRIDEMENT HIP;  Surgeon: Shona Needles, MD;  Location: [facility];   Service: Orthopedics;  Laterality: Left;  . INCISION AND DRAINAGE HIP Left 10/11/2019   Procedure: IRRIGATION AND DEBRIDEMENT HIP;  Surgeon: Newt Minion, MD;  Location: [facility];  Service: Orthopedics;  Laterality: Left;  . INCISION AND DRAINAGE OF WOUND Left 07/12/2019   Procedure: LEFT HIP DEBRIDEMENT;  Surgeon: Newt Minion, MD;  Location: [facility];  Service: Orthopedics;  Laterality: Left;  . IR PARACENTESIS  09/13/2019  . IR PARACENTESIS  10/17/2019  . IR US GUIDE BX ASP/DRAIN  06/17/2019  . IR US GUIDE BX ASP/DRAIN  09/13/2019  . KNEE ARTHROSCOPY Bilateral  left x 2, 1 right  . LAPAROSCOPIC CHOLECYSTECTOMY  ~ 2010  . LUMBAR DISC SURGERY    . NASAL SINUS SURGERY    . TEE WITHOUT CARDIOVERSION N/A 06/16/2019   Procedure: TRANSESOPHAGEAL ECHOCARDIOGRAM (TEE);  Surgeon: Dorothy Spark, MD;  Location: [facility] ENDOSCOPY;  Service: Cardiovascular;  Laterality: N/A;   Social History   Occupational History  . Occupation: Logistics  Tobacco Use  . Smoking status: Former Smoker    Types: Cigars    Quit date: 09/10/2017    Years since quitting: 2.2  . Smokeless tobacco: Never Used  Vaping Use  . Vaping Use: Never used  Substance and Sexual Activity  . Alcohol use: Never  . Drug use: Never  . Sexual activity: Not on file

## 2019-12-26 ENCOUNTER — Telehealth: Payer: Self-pay | Admitting: Physician Assistant

## 2019-12-26 ENCOUNTER — Other Ambulatory Visit: Payer: Self-pay | Admitting: Physician Assistant

## 2019-12-26 MED ORDER — OXYCODONE HCL 5 MG PO TABS: 5.0000 mg | ORAL_TABLET | Freq: Four times a day (QID) | ORAL | 0 refills | Status: DC | PRN

## 2019-12-26 NOTE — Telephone Encounter (Signed)
Patient called requesting a refill on oxycodone. Please send to pharmacy on file. Patient phone number is [phone].

## 2019-12-26 NOTE — Telephone Encounter (Signed)
done

## 2019-12-26 NOTE — Telephone Encounter (Signed)
Pt requesting refill on pain medication. Last refill Oxycodone 25m #20 11/25/19

## 2019-12-26 NOTE — Telephone Encounter (Signed)
Patient was called but could not lvm.

## 2020-01-19 ENCOUNTER — Encounter: Payer: Self-pay | Admitting: Orthopedic Surgery

## 2020-01-19 ENCOUNTER — Other Ambulatory Visit: Payer: Self-pay

## 2020-01-19 ENCOUNTER — Ambulatory Visit (INDEPENDENT_AMBULATORY_CARE_PROVIDER_SITE_OTHER): Payer: Self-pay | Admitting: Physician Assistant

## 2020-01-19 VITALS — Ht 74.0 in | Wt 293.0 lb

## 2020-01-19 DIAGNOSIS — T8130XA Disruption of wound, unspecified, initial encounter: Secondary | ICD-10-CM

## 2020-01-19 MED ORDER — GABAPENTIN 300 MG PO CAPS: 300.0000 mg | ORAL_CAPSULE | Freq: Three times a day (TID) | ORAL | 3 refills | Status: AC

## 2020-01-19 MED ORDER — OXYCODONE HCL 5 MG PO TABS: 5.0000 mg | ORAL_TABLET | Freq: Four times a day (QID) | ORAL | 0 refills | Status: AC | PRN

## 2020-01-19 NOTE — Progress Notes (Signed)
Office Visit Note   Patient: Robert Sullivan           Date of Birth: 06-May-1966           MRN: [id number] Visit Date: 01/19/2020              Requested by: Cathleen Corti, PA-C [place],  [place] [address] PCP: Cathleen Corti, PA-C  Chief Complaint  Patient presents with  . Left Hip - Routine Post Op    10/11/19 Left hip I&D        HPI: Patient here is in follow-up for his left thigh abscess.  He has been doing wet-to-dry dressing changes.  He continues to be losing weight through a plant-based diet.  He also said on the right thigh he was recently diagnosed with Ila Mcgill  Syndrome he feels he continues to improve  Assessment & Plan: Visit Diagnoses: No diagnosis found.  Plan: We will continue current treatment follow-up in 4 weeks.  New wet-to-dry dressing will be applied today I will also try him on a course of gabapentin which may help with his right thigh Follow-Up Instructions: No follow-ups on file.   Ortho Exam  Patient is alert, oriented, no adenopathy, well-dressed, normal affect, normal respiratory effort. Focused examination of the left thigh.  Wound measures 10 cm in length and 2-1/2 cm in width.  Is about a centimeter deep with good vascular fibrous tissue is pink without drainage or foul odor no surrounding cellulitis or fluctuance  Imaging: No results found. No images are attached to the encounter.  Labs: Lab Results  Component Value Date   HGBA1C 4.6 (L) 09/12/2019   HGBA1C 5.5 07/10/2019   HGBA1C 11.0 (H) 05/13/2019   ESRSEDRATE 12 10/06/2019   ESRSEDRATE 15 09/12/2019   ESRSEDRATE 12 07/11/2019   CRP 2.8 (H) 10/06/2019   CRP 1.9 (H) 09/25/2019   CRP 1.8 (H) 09/24/2019   REPTSTATUS 10/22/2019 FINAL 10/17/2019   REPTSTATUS 10/17/2019 FINAL 10/17/2019   GRAMSTAIN  10/17/2019    WBC PRESENT, PREDOMINANTLY MONONUCLEAR NO ORGANISMS SEEN CYTOSPIN SMEAR Performed at [facility], [place] [address], [place],  [address]     CULT NO GROWTH 5 DAYS 10/17/2019   LABORGA ENTEROBACTER SPECIES 10/08/2019     Lab Results  Component Value Date   ALBUMIN 2.5 (L) 10/17/2019   ALBUMIN 2.4 (L) 10/16/2019   ALBUMIN 2.3 (L) 10/14/2019    Lab Results  Component Value Date   MG 1.8 10/18/2019   MG 1.8 10/17/2019   MG 1.8 09/25/2019   No results found for: VD25OH  No results found for: PREALBUMIN CBC EXTENDED Latest Ref Rng & Units 10/17/2019 10/16/2019 10/14/2019  WBC 4.0 - 10.5 K/uL 7.2 13.7(H) 8.5  RBC 4.22 - 5.81 MIL/uL 2.94(L) 3.58(L) 3.19(L)  HGB 13.0 - 17.0 g/dL 8.7(L) 10.6(L) 9.3(L)  HCT 39 - 52 % 27.4(L) 32.9(L) 28.9(L)  PLT 150 - 400 K/uL PLATELET CLUMPS NOTED ON SMEAR, COUNT APPEARS DECREASED 157 98(L)  NEUTROABS 1.7 - 7.7 K/uL 3.9 9.4(H) -  LYMPHSABS 0.7 - 4.0 K/uL 1.6 1.9 -     Body mass index is 37.62 kg/m.  Orders:  No orders of the defined types were placed in this encounter.  No orders of the defined types were placed in this encounter.    Procedures: No procedures performed  Clinical Data: No additional findings.  ROS:  All other systems negative, except as noted in the HPI. Review of Systems  Objective: Vital Signs: Ht 6' 2"  (1.88 m)   Wt 293 lb (132.9 kg)   BMI 37.62 kg/m   Specialty Comments:  No specialty comments available.  PMFS History: Patient Active Problem List   Diagnosis Date Noted  . Decompensated hepatic cirrhosis ([place]) 10/17/2019  . Anemia 10/09/2019  . AKI (acute kidney injury) ([place]) 10/09/2019  . Wound dehiscence 10/08/2019  . Ascites   . Anasarca 09/12/2019  . Cirrhosis of liver with ascites ([place]) 09/12/2019  . Abscess, gluteal, left 09/12/2019  . Pleural effusion 09/12/2019  . Thrombocytopenia ([place]) 09/12/2019  . Blister of hip with infection 07/09/2019  . Abscess of left hip   . MSSA bacteremia 06/19/2019  . Uncontrolled diabetes mellitus ([place]) 06/10/2019  . Obesity, Class III, BMI 40-49.9 (morbid obesity) ([place]) 06/10/2019  . Cellulitis,  gluteal, left 06/10/2019  . Sepsis secondary to UTI ([place]) 05/13/2019  . Acute lower UTI 05/13/2019  . Type 2 diabetes mellitus without complication ([place]) 28/76/8115  . Hip pain, acute, left 05/13/2019  . Sepsis ([place]) 05/13/2019  . Acute cystitis without hematuria   . Hyperglycemia    Past Medical History:  Diagnosis Date  . Anemia   . Anxiety   . Arthritis 05/13/2019   knees  . Cirrhosis ([place])   . Depression   . Diabetes mellitus without complication ([place])   . DVT (deep venous thrombosis) ([place])   . Gallstones   . Gluteal abscess   . Headache   . HTN (hypertension)   . Hyperlipidemia   . Sleep apnea    doesn't use cpap  . Splenomegaly     Family History  Problem Relation Age of Onset  . Hypertension Father     Past Surgical History:  Procedure Laterality Date  . I & D EXTREMITY Left 07/16/2019   Procedure: LEFT HIP DEBRIDEMENT;  Surgeon: Newt Minion, MD;  Location: [facility];  Service: Orthopedics;  Laterality: Left;  . I & D EXTREMITY Left 07/18/2019   Procedure: REPEAT IRRIGATION AND DEBRIDEMENT LEFT HIP;  Surgeon: Newt Minion, MD;  Location: [place];  Service: Orthopedics;  Laterality: Left;  . I & D EXTREMITY Left 09/19/2019   Procedure: DEBRIDEMENT LEFT HIP [place] OF WOUND VAC;  Surgeon: Newt Minion, MD;  Location: [facility];  Service: Orthopedics;  Laterality: Left;  . I & D EXTREMITY Left 10/08/2019   Procedure: IRRIGATION AND DEBRIDEMENT, REVISION LEFT HIP WOUND;  Surgeon: Newt Minion, MD;  Location: [facility];  Service: Orthopedics;  Laterality: Left;  . INCISION AND DRAINAGE ABSCESS Left 06/23/2019   Procedure: INCISION AND DRAINAGE GLUTEAL ABSCESS;  Surgeon: Renette Butters, MD;  Location: [facility];  Service: Orthopedics;  Laterality: Left;  . INCISION AND DRAINAGE ABSCESS Left 07/09/2019   Procedure: INCISION AND DRAINAGE LEFT HIP/PELVIC ABSCESS, APPLICATION OF NEGATIVE PRESSURE WOUND VAC;  Surgeon: Newt Minion, MD;  Location: [facility];  Service: Orthopedics;   Laterality: Left;  . INCISION AND DRAINAGE HIP Left 06/25/2019   Procedure: IRRIGATION AND DEBRIDEMENT HIP;  Surgeon: Shona Needles, MD;  Location: [facility];  Service: Orthopedics;  Laterality: Left;  . INCISION AND DRAINAGE HIP Left 10/11/2019   Procedure: IRRIGATION AND DEBRIDEMENT HIP;  Surgeon: Newt Minion, MD;  Location: [facility];  Service: Orthopedics;  Laterality: Left;  . INCISION AND DRAINAGE OF WOUND Left 07/12/2019   Procedure: LEFT HIP DEBRIDEMENT;  Surgeon: Newt Minion, MD;  Location: [facility];  Service: Orthopedics;  Laterality: Left;  . IR PARACENTESIS  09/13/2019  . IR PARACENTESIS  10/17/2019  . IR US GUIDE BX ASP/DRAIN  06/17/2019  . IR US GUIDE BX ASP/DRAIN  09/13/2019  . KNEE ARTHROSCOPY Bilateral    left x 2, 1 right  . LAPAROSCOPIC CHOLECYSTECTOMY  ~ 2010  . LUMBAR DISC SURGERY    . NASAL SINUS SURGERY    . TEE WITHOUT CARDIOVERSION N/A 06/16/2019   Procedure: TRANSESOPHAGEAL ECHOCARDIOGRAM (TEE);  Surgeon: Dorothy Spark, MD;  Location: [facility] ENDOSCOPY;  Service: Cardiovascular;  Laterality: N/A;   Social History   Occupational History  . Occupation: Logistics  Tobacco Use  . Smoking status: Former Smoker    Types: Cigars    Quit date: 09/10/2017    Years since quitting: 2.3  . Smokeless tobacco: Never Used  Vaping Use  . Vaping Use: Never used  Substance and Sexual Activity  . Alcohol use: Never  . Drug use: Never  . Sexual activity: Not on file
